# Patient Record
Sex: Female | Born: 1965 | Race: White | Hispanic: No | Marital: Married | Smoking: Never smoker
Health system: Southern US, Community
[De-identification: ages and names within clinical notes are randomized; demographics above are authoritative.]

## PROBLEM LIST (undated history)

## (undated) DIAGNOSIS — G259 Extrapyramidal and movement disorder, unspecified: Secondary | ICD-10-CM

## (undated) DIAGNOSIS — Z87442 Personal history of urinary calculi: Secondary | ICD-10-CM

## (undated) DIAGNOSIS — N200 Calculus of kidney: Secondary | ICD-10-CM

## (undated) DIAGNOSIS — H539 Unspecified visual disturbance: Secondary | ICD-10-CM

## (undated) DIAGNOSIS — G35D Multiple sclerosis, unspecified: Secondary | ICD-10-CM

## (undated) DIAGNOSIS — G629 Polyneuropathy, unspecified: Secondary | ICD-10-CM

## (undated) DIAGNOSIS — R51 Headache: Secondary | ICD-10-CM

## (undated) DIAGNOSIS — R519 Headache, unspecified: Secondary | ICD-10-CM

## (undated) DIAGNOSIS — A6009 Herpesviral infection of other urogenital tract: Secondary | ICD-10-CM

## (undated) DIAGNOSIS — K5190 Ulcerative colitis, unspecified, without complications: Secondary | ICD-10-CM

## (undated) DIAGNOSIS — G35 Multiple sclerosis: Secondary | ICD-10-CM

## (undated) DIAGNOSIS — I10 Essential (primary) hypertension: Secondary | ICD-10-CM

## (undated) DIAGNOSIS — B002 Herpesviral gingivostomatitis and pharyngotonsillitis: Secondary | ICD-10-CM

## (undated) HISTORY — DX: Headache, unspecified: R51.9

## (undated) HISTORY — DX: Headache: R51

## (undated) HISTORY — DX: Polyneuropathy, unspecified: G62.9

## (undated) HISTORY — DX: Unspecified visual disturbance: H53.9

## (undated) HISTORY — PX: KIDNEY STONE SURGERY: SHX686

## (undated) HISTORY — DX: Extrapyramidal and movement disorder, unspecified: G25.9

---

## 2013-01-08 ENCOUNTER — Other Ambulatory Visit: Payer: Self-pay | Admitting: Physician Assistant

## 2013-01-08 DIAGNOSIS — Z1231 Encounter for screening mammogram for malignant neoplasm of breast: Secondary | ICD-10-CM

## 2013-02-05 ENCOUNTER — Ambulatory Visit: Payer: Self-pay

## 2013-02-26 ENCOUNTER — Ambulatory Visit
Admission: RE | Admit: 2013-02-26 | Discharge: 2013-02-26 | Disposition: A | Discharge status: Home / Self Care | Payer: Managed Care, Other (non HMO) | Source: Ambulatory Visit | Attending: Physician Assistant | Admitting: Physician Assistant

## 2013-02-26 DIAGNOSIS — Z1231 Encounter for screening mammogram for malignant neoplasm of breast: Secondary | ICD-10-CM

## 2013-05-30 ENCOUNTER — Emergency Department (HOSPITAL_COMMUNITY): Payer: Managed Care, Other (non HMO)

## 2013-05-30 ENCOUNTER — Encounter (HOSPITAL_COMMUNITY): Payer: Self-pay | Admitting: Emergency Medicine

## 2013-05-30 DIAGNOSIS — R509 Fever, unspecified: Secondary | ICD-10-CM | POA: Insufficient documentation

## 2013-05-30 DIAGNOSIS — I10 Essential (primary) hypertension: Secondary | ICD-10-CM | POA: Insufficient documentation

## 2013-05-30 DIAGNOSIS — Z8669 Personal history of other diseases of the nervous system and sense organs: Secondary | ICD-10-CM | POA: Insufficient documentation

## 2013-05-30 DIAGNOSIS — Z79899 Other long term (current) drug therapy: Secondary | ICD-10-CM | POA: Insufficient documentation

## 2013-05-30 DIAGNOSIS — R52 Pain, unspecified: Secondary | ICD-10-CM | POA: Insufficient documentation

## 2013-05-30 DIAGNOSIS — Z880 Allergy status to penicillin: Secondary | ICD-10-CM | POA: Insufficient documentation

## 2013-05-30 DIAGNOSIS — Z8719 Personal history of other diseases of the digestive system: Secondary | ICD-10-CM | POA: Insufficient documentation

## 2013-05-30 DIAGNOSIS — R5381 Other malaise: Secondary | ICD-10-CM | POA: Insufficient documentation

## 2013-05-30 LAB — CBC WITH DIFFERENTIAL/PLATELET
Basophils Relative: 0 % (ref 0–1)
Hemoglobin: 10.6 g/dL — ABNORMAL LOW (ref 12.0–15.0)
Lymphocytes Relative: 3 % — ABNORMAL LOW (ref 12–46)
Lymphs Abs: 0.5 10*3/uL — ABNORMAL LOW (ref 0.7–4.0)
Monocytes Relative: 6 % (ref 3–12)
Neutro Abs: 14.3 10*3/uL — ABNORMAL HIGH (ref 1.7–7.7)
Neutrophils Relative %: 90 % — ABNORMAL HIGH (ref 43–77)
RBC: 3.46 MIL/uL — ABNORMAL LOW (ref 3.87–5.11)
WBC: 15.9 10*3/uL — ABNORMAL HIGH (ref 4.0–10.5)

## 2013-05-30 LAB — POCT I-STAT, CHEM 8
Chloride: 104 mEq/L (ref 96–112)
Glucose, Bld: 117 mg/dL — ABNORMAL HIGH (ref 70–99)
HCT: 35 % — ABNORMAL LOW (ref 36.0–46.0)
Potassium: 3.6 mEq/L (ref 3.5–5.1)

## 2013-05-30 NOTE — ED Notes (Signed)
Patient with fever since this am.  Patient has MS.  She is warm to the touch.  She did take 449m of ibuprofen prior to arrival to ED.

## 2013-05-31 ENCOUNTER — Emergency Department (HOSPITAL_COMMUNITY)
Admission: EM | Admit: 2013-05-31 | Discharge: 2013-05-31 | Disposition: A | Discharge status: Home / Self Care | Payer: Managed Care, Other (non HMO) | Attending: Emergency Medicine | Admitting: Emergency Medicine

## 2013-05-31 DIAGNOSIS — R509 Fever, unspecified: Secondary | ICD-10-CM

## 2013-05-31 DIAGNOSIS — R531 Weakness: Secondary | ICD-10-CM

## 2013-05-31 HISTORY — DX: Multiple sclerosis: G35

## 2013-05-31 HISTORY — DX: Essential (primary) hypertension: I10

## 2013-05-31 HISTORY — DX: Ulcerative colitis, unspecified, without complications: K51.90

## 2013-05-31 HISTORY — DX: Multiple sclerosis, unspecified: G35.D

## 2013-05-31 LAB — URINALYSIS, ROUTINE W REFLEX MICROSCOPIC
Glucose, UA: NEGATIVE mg/dL
Hgb urine dipstick: NEGATIVE
Specific Gravity, Urine: 1.027 (ref 1.005–1.030)
pH: 5.5 (ref 5.0–8.0)

## 2013-05-31 MED ORDER — TRAMADOL HCL 50 MG PO TABS
50.0000 mg | ORAL_TABLET | Freq: Once | ORAL | Status: AC
  Administered 2013-05-31: 50 mg via ORAL
  Filled 2013-05-31: qty 1

## 2013-05-31 NOTE — ED Provider Notes (Signed)
CSN: [id number]     Arrival date & time 05/30/13  2128 History   First MD Initiated Contact with Patient 05/31/13 0122     Chief Complaint  Patient presents with  . Fever   (Consider location/radiation/quality/duration/timing/severity/associated sxs/prior Treatment) Patient is a 47 y.o. female presenting with fever. The history is provided by the patient.  Fever She had onset this evening of fever which was as high as 101.7. He she had started feeling badly at about noon when she noted extreme weakness to the point where she could hardly raise her arms. She is also had chills and generalized bodyaches. Pain is moderate and she rates it at 5/10. She denies sore throat, cough, nausea, vomiting, diarrhea. She normally uses an adult diaper for urination and has not noted any dysuria. She has a history of multiple sclerosis. She did take ibuprofen at home prior to coming to the ED. Because of fever and body aches, she was asked about travel to [place] or exposure to someone who had traveled to [place] and she denies both of these.  Past Medical History  Diagnosis Date  . Multiple sclerosis   . Ulcerative colitis   . Hypertension    Past Surgical History  Procedure Laterality Date  . Kidney stone surgery     History reviewed. No pertinent family history. History  Substance Use Topics  . Smoking status: Never Smoker   . Smokeless tobacco: Not on file  . Alcohol Use: No   OB History   Grav Para Term Preterm Abortions TAB SAB Ect Mult Living                 Review of Systems  Constitutional: Positive for fever.  All other systems reviewed and are negative.    Allergies  Penicillins  Home Medications   Current Outpatient Rx  Name  Route  Sig  Dispense  Refill  . b complex vitamins capsule   Oral   Take 1 capsule by mouth daily.         . baclofen (LIORESAL) 10 MG tablet   Oral   Take 10 mg by mouth 3 (three) times daily.         . cholecalciferol  (VITAMIN D) 1000 UNITS tablet   Oral   Take 1,000 Units by mouth daily.         . dantrolene (DANTRIUM) 50 MG capsule   Oral   Take 50 mg by mouth 3 (three) times daily.         Marland Kitchen FLUoxetine (PROZAC) 20 MG tablet   Oral   Take 20 mg by mouth daily.         Marland Kitchen ibuprofen (ADVIL,MOTRIN) 200 MG tablet   Oral   Take 400 mg by mouth every 6 (six) hours as needed for pain (pain).         Marland Kitchen labetalol (NORMODYNE) 100 MG tablet   Oral   Take 100 mg by mouth daily.         . mesalamine (PENTASA) 500 MG CR capsule   Oral   Take 1,000 mg by mouth 3 (three) times daily.         Marland Kitchen omeprazole (PRILOSEC) 40 MG capsule   Oral   Take 40 mg by mouth 2 (two) times daily.         . solifenacin (VESICARE) 10 MG tablet   Oral   Take 10 mg by mouth daily.         Marland Kitchen  Teriflunomide 14 MG TABS   Oral   Take 14 mg by mouth daily.          BP 150/86  Pulse 93  Temp(Src) 98.5 F (36.9 C) (Oral)  Resp 16  Ht 5' 3"  (1.6 m)  Wt 170 lb (77.111 kg)  BMI 30.12 kg/m2  SpO2 94%  LMP 05/25/2013 Physical Exam  Nursing note and vitals reviewed.  47 year old female, resting comfortably and in no acute distress. Vital signs are significant for mild hypertension with blood pressure 150/86. Oxygen saturation is 94%, which is normal. Head is normocephalic and atraumatic. PERRLA, EOMI. Oropharynx is clear. Neck is nontender and supple without adenopathy or JVD. Back is nontender and there is no CVA tenderness. Lungs are clear without rales, wheezes, or rhonchi. Chest is nontender. Heart has regular rate and rhythm without murmur. Abdomen is soft, flat, nontender without masses or hepatosplenomegaly and peristalsis is normoactive. Extremities have no cyanosis or edema, full range of motion is present. Skin is warm and dry without rash. Neurologic: Mental status is normal, cranial nerves are intact. She has moderate bilateral arm weakness and moderate to severe bilateral leg weakness from  prior multiple sclerosis.   ED Course  Procedures (including critical care time) Labs Review Results for orders placed during the hospital encounter of 05/31/13  CBC WITH DIFFERENTIAL      Result Value Range   WBC 15.9 (*) 4.0 - 10.5 K/uL   RBC 3.46 (*) 3.87 - 5.11 MIL/uL   Hemoglobin 10.6 (*) 12.0 - 15.0 g/dL   HCT 32.3 (*) 36.0 - 46.0 %   MCV 93.4  78.0 - 100.0 fL   MCH 30.6  26.0 - 34.0 pg   MCHC 32.8  30.0 - 36.0 g/dL   RDW 15.5  11.5 - 15.5 %   Platelets 191  150 - 400 K/uL   Neutrophils Relative % 90 (*) 43 - 77 %   Neutro Abs 14.3 (*) 1.7 - 7.7 K/uL   Lymphocytes Relative 3 (*) 12 - 46 %   Lymphs Abs 0.5 (*) 0.7 - 4.0 K/uL   Monocytes Relative 6  3 - 12 %   Monocytes Absolute 1.0  0.1 - 1.0 K/uL   Eosinophils Relative 0  0 - 5 %   Eosinophils Absolute 0.0  0.0 - 0.7 K/uL   Basophils Relative 0  0 - 1 %   Basophils Absolute 0.0  0.0 - 0.1 K/uL  URINALYSIS, ROUTINE W REFLEX MICROSCOPIC      Result Value Range   Color, Urine AMBER (*) YELLOW   APPearance CLOUDY (*) CLEAR   Specific Gravity, Urine 1.027  1.005 - 1.030   pH 5.5  5.0 - 8.0   Glucose, UA NEGATIVE  NEGATIVE mg/dL   Hgb urine dipstick NEGATIVE  NEGATIVE   Bilirubin Urine SMALL (*) NEGATIVE   Ketones, ur 15 (*) NEGATIVE mg/dL   Protein, ur NEGATIVE  NEGATIVE mg/dL   Urobilinogen, UA 0.2  0.0 - 1.0 mg/dL   Nitrite NEGATIVE  NEGATIVE   Leukocytes, UA NEGATIVE  NEGATIVE  POCT I-STAT, CHEM 8      Result Value Range   Sodium 138  135 - 145 mEq/L   Potassium 3.6  3.5 - 5.1 mEq/L   Chloride 104  96 - 112 mEq/L   BUN 14  6 - 23 mg/dL   Creatinine, Ser 0.70  0.50 - 1.10 mg/dL   Glucose, Bld 117 (*) 70 - 99 mg/dL   Calcium,  Ion 0.83 (*) 1.12 - 1.23 mmol/L   TCO2 25  0 - 100 mmol/L   Hemoglobin 11.9 (*) 12.0 - 15.0 g/dL   HCT 35.0 (*) 36.0 - 46.0 %   Imaging Review Dg Chest 2 View  05/30/2013   CLINICAL DATA:  Fever.  EXAM: CHEST  2 VIEW  COMPARISON:  None.  FINDINGS: Hyperinflated lungs without edema or  consolidation. No definite effusion, although posterior costophrenic sulci are obscured. No cardiomegaly. Dextroscoliosis.  IMPRESSION: No evidence of pneumonia.   Electronically Signed   By: Jorje Guild M.D.   On: 05/30/2013 22:57    EKG Interpretation   None       MDM   1. Fever   2. Weakness    Fever with weakness in patient with multiple sclerosis. WBC has come back elevated with left shift. Chest x-ray is unremarkable. Urinalysis will be obtained to rule out UTI.  Urinalysis has come back negative. Patient states that her strength is almost back to normal. She continues to be afebrile even though it has been more than 6 hours since her last dose of ibuprofen. She does not appear toxic in any way. Do not see an indication for inpatient care but I am concerned about her fever and weakness. She will need followup with her PCP. Cultures are sent of blood and urine and she is advised to return if symptoms worsen in any way. In patient also pointed out that she has an area on her left third finger which has been treated as a fungal infection. This appears to be a low-grade paronychia without sufficient accumulation of breast aren't incision and drainage. She's a vice use topical treatment. I do not feel there is sufficient infection there to account for her fever at home.  Delora Fuel, MD 01/65/53 [id number]

## 2013-06-01 LAB — URINE CULTURE
Colony Count: NO GROWTH
Culture: NO GROWTH

## 2013-06-07 LAB — CULTURE, BLOOD (ROUTINE X 2)
Culture: NO GROWTH
Culture: NO GROWTH

## 2014-03-02 ENCOUNTER — Other Ambulatory Visit: Payer: Self-pay

## 2014-03-02 DIAGNOSIS — Z1231 Encounter for screening mammogram for malignant neoplasm of breast: Secondary | ICD-10-CM

## 2014-03-12 ENCOUNTER — Ambulatory Visit
Admission: RE | Admit: 2014-03-12 | Discharge: 2014-03-12 | Disposition: A | Discharge status: Home / Self Care | Payer: Managed Care, Other (non HMO) | Source: Ambulatory Visit

## 2014-03-12 ENCOUNTER — Encounter (INDEPENDENT_AMBULATORY_CARE_PROVIDER_SITE_OTHER): Payer: Self-pay

## 2014-03-12 DIAGNOSIS — Z1231 Encounter for screening mammogram for malignant neoplasm of breast: Secondary | ICD-10-CM

## 2014-09-07 ENCOUNTER — Ambulatory Visit (INDEPENDENT_AMBULATORY_CARE_PROVIDER_SITE_OTHER): Payer: Managed Care, Other (non HMO) | Admitting: Neurology

## 2014-09-07 ENCOUNTER — Encounter: Payer: Self-pay | Admitting: Neurology

## 2014-09-07 VITALS — BP 156/84 | HR 78 | Resp 14 | Ht 63.0 in | Wt 170.0 lb

## 2014-09-07 DIAGNOSIS — F329 Major depressive disorder, single episode, unspecified: Secondary | ICD-10-CM | POA: Diagnosis not present

## 2014-09-07 DIAGNOSIS — G8389 Other specified paralytic syndromes: Secondary | ICD-10-CM | POA: Diagnosis not present

## 2014-09-07 DIAGNOSIS — R350 Frequency of micturition: Secondary | ICD-10-CM | POA: Diagnosis not present

## 2014-09-07 DIAGNOSIS — R208 Other disturbances of skin sensation: Secondary | ICD-10-CM | POA: Diagnosis not present

## 2014-09-07 DIAGNOSIS — G35 Multiple sclerosis: Secondary | ICD-10-CM | POA: Diagnosis not present

## 2014-09-07 DIAGNOSIS — E559 Vitamin D deficiency, unspecified: Secondary | ICD-10-CM

## 2014-09-07 DIAGNOSIS — Z79899 Other long term (current) drug therapy: Secondary | ICD-10-CM | POA: Diagnosis not present

## 2014-09-07 DIAGNOSIS — F339 Major depressive disorder, recurrent, unspecified: Secondary | ICD-10-CM | POA: Insufficient documentation

## 2014-09-07 DIAGNOSIS — F32A Depression, unspecified: Secondary | ICD-10-CM

## 2014-09-07 DIAGNOSIS — G8250 Quadriplegia, unspecified: Secondary | ICD-10-CM | POA: Insufficient documentation

## 2014-09-07 MED ORDER — LAMOTRIGINE 100 MG PO TABS: 100.0000 mg | ORAL_TABLET | Freq: Every day | ORAL | Status: DC

## 2014-09-07 MED ORDER — FLUOXETINE HCL 40 MG PO CAPS: 40.0000 mg | ORAL_CAPSULE | Freq: Every day | ORAL | Status: DC

## 2014-09-07 MED ORDER — LAMOTRIGINE 25 MG PO TABS: ORAL_TABLET | ORAL | Status: DC

## 2014-09-07 MED ORDER — BACLOFEN 10 MG PO TABS: ORAL_TABLET | ORAL | Status: DC

## 2014-09-07 NOTE — Progress Notes (Addendum)
[facility]  [name] DOB: Mar 10, 1966  REFERRING CLINICIAN: Adah Salvage, [facility] HISTORY FROM: patient  REASON FOR VISIT: MS   HISTORICAL  CHIEF COMPLAINT:  Chief Complaint  Patient presents with  . Multiple Sclerosis    She continues to c/o tingling sensation right shoulder, running down right arm, into fingertips, only when she is in the shower.  Sts. fatigue is worse./fim    HISTORY OF PRESENT ILLNESS:  Vanessa Short is a 49 year old woman who presented with a Lhermite's syndrome in 1988 or 1989. When she flexed her neck, she would get a vibration sensation down to her toes. MRIs of the head and neck were performed. They showed lesions consistent with the diagnosis of multiple sclerosis. Lumbar puncture was not necessary. Over the next 15 years, she would get some exacerbations and have courses of steroids. However, a disease modifying therapy was not prescribed. By 2004, she had difficulty with her gait and would often have to lean on somebody for support.  She started Copaxone that year and stayed on it for about one half years. She stopped due to a lot of itching. She then switched to Rebif. While on Copaxone Rebif she continues to have exacerbations and further difficulties with her gait. About 3 years ago she switched to [facility] and has been on that medicine since. She tolerates Aubagio well but is not sure how well it is working for her.  Her main issue is decrease gait. She started using a cane on a regular basis around 2004. Then around 2007 or 2008 she started to use an IT trainer wheelchair. She continues to have a lot of weakness in the legs. Legs are also very spastic. She is on baclofen 10 mg by mouth 3 times a day with an additional half pill at bedtime. She also is on dantrolene. He also has had gradual worsening of spasticity in the arms, left worse than right. Botox therapy but it did not significantly help her. It led  to a few weeks off significantly less spasming in the hands and maybe a couple more weeks with some reduction of spasming. However, it did not increase function. Her right arm is less spastic than the left but she still has difficulty with muscle spasms and sometimes requires helped to straighten the arm out.  She has painful dysesthesias in the right shoulder. The pain will increase if it is hit by hot water. This occurs when she takes a shower. We had tried gabapentin. I was hoping to get her up to a dose of at least 900 mg a day. However, even a dose of 600 mg a day was not well tolerated due to lethargy.  She does have some difficulty with vision, both with acuity and with double vision at times. In 4 or 5 years and we discussed that she should probably follow up with an ophthalmologist for checkup.  She notes vertigo at times. It is spinning in nature. This is more likely to happen right after her shower. If she closes her eyes the vertigo bothers her a little less. It may last up to 45 minutes.  She has mild bladder dysfunction. When she is at home she usually is able to get to the bathroom to urinate. However, she wears depends as she will sometimes have incontinence. Out of the house is more difficult  She notes more fatigue. More stress having to deal with her father's estate as he was out of state and she  is not getting any significant help from her siblings. She is having more difficulty with her sleep. He usually falls asleep well but if she wakes up she could have difficulty falling asleep. May have received mild benefit with the gabapentin to fall asleep but not as much to stay asleep. She has not had major problems with cognitive issues. She does report some depression that has been worse the past year. Fluoxetine has helped a little bit in that she is less likely to "blow up" however, it has not helped her mood for much of the day.   REVIEW OF SYSTEMS:  Constitutional: No fevers,  chills, sweats, or change in appetite Eyes: see above.   No eye pain Ear, nose and throat: No hearing loss, ear pain, nasal congestion, sore throat Cardiovascular: No chest pain, palpitations Respiratory:  No shortness of breath at rest or with exertion.   No wheezes GastrointestinaI: No nausea, vomiting, diarrhea, abdominal pain, fecal incontinence Genitourinary:  No dysuria, urinary retention or frequency.  No nocturia. Musculoskeletal:  No neck pain, back pain Integumentary: No rash, pruritus, skin lesions Neurological: as above Psychiatric: as above. Endocrine: No palpitations, diaphoresis, change in appetite, change in weigh or increased thirst Hematologic/Lymphatic:  No anemia, purpura, petechiae. Allergic/Immunologic: No itchy/runny eyes, nasal congestion, recent allergic reactions, rashes  ALLERGIES: Allergies  Allergen Reactions  . Penicillins Rash    HOME MEDICATIONS: Outpatient Prescriptions Prior to Visit  Medication Sig Dispense Refill  . b complex vitamins capsule Take 1 capsule by mouth daily.    . baclofen (LIORESAL) 10 MG tablet Take 10 mg by mouth 3 (three) times daily.    . cholecalciferol (VITAMIN D) 1000 UNITS tablet Take 1,000 Units by mouth daily.    . dantrolene (DANTRIUM) 50 MG capsule Take 50 mg by mouth 3 (three) times daily.    Marland Kitchen ibuprofen (ADVIL,MOTRIN) 200 MG tablet Take 400 mg by mouth every 6 (six) hours as needed for pain (pain).    Marland Kitchen labetalol (NORMODYNE) 100 MG tablet Take 100 mg by mouth daily.    . mesalamine (PENTASA) 500 MG CR capsule Take 1,000 mg by mouth 3 (three) times daily.    Marland Kitchen omeprazole (PRILOSEC) 40 MG capsule Take 40 mg by mouth 2 (two) times daily.    . solifenacin (VESICARE) 10 MG tablet Take 10 mg by mouth daily.    . Teriflunomide 14 MG TABS Take 14 mg by mouth daily.    Marland Kitchen FLUoxetine (PROZAC) 20 MG tablet Take 20 mg by mouth daily.     No facility-administered medications prior to visit.    PAST MEDICAL HISTORY: Past  Medical History  Diagnosis Date  . Multiple sclerosis   . Ulcerative colitis   . Hypertension   . Headache   . Movement disorder   . Neuropathy   . Vision abnormalities     PAST SURGICAL HISTORY: Past Surgical History  Procedure Laterality Date  . Kidney stone surgery      FAMILY HISTORY: Family History  Problem Relation Age of Onset  . Dementia Mother   . Hypertension Father   . Hyperlipidemia Father   . Diabetes Father   . Heart disease Father     SOCIAL HISTORY:  History   Social History  . Marital Status: Married    Spouse Name: N/A    Number of Children: N/A  . Years of Education: N/A   Occupational History  . Not on file.   Social History Main Topics  . Smoking status:  Never Smoker   . Smokeless tobacco: Not on file  . Alcohol Use: No  . Drug Use: No  . Sexual Activity: Not on file   Other Topics Concern  . Not on file   Social History Narrative     PHYSICAL EXAM  Filed Vitals:   09/07/14 1357  BP: 156/84  Pulse: 78  Resp: 14  Height: 5' 3"  (1.6 m)  Weight: 170 lb (77.111 kg)    Body mass index is 30.12 kg/(m^2).   General: The patient is well-developed and well-nourished and in no acute distress  Eyes:  Funduscopic exam shows normal optic discs and retinal vessels.  Neck: The neck is supple, no carotid bruits are noted.  The neck is nontender.  Respiratory: The respiratory examination is clear.  Cardiovascular: The cardiovascular examination reveals a regular rate and rhythm, no murmurs, gallops or rubs are noted.  Skin: Extremities are without significant edema.  Neurologic Exam  Mental status: The patient is alert and oriented x 3 at the time of the examination. The patient has apparent normal recent and remote memory, with an apparently normal attention span and concentration ability.   Speech is normal.  Cranial nerves: Extraocular movements are full. Pupils are equal, round, and reactive to light and accomodation.  Visual  fields are full.  Facial symmetry is present. There is good facial sensation to soft touch bilaterally.Facial strength is normal.  Trapezius and sternocleidomastoid strength is normal. No dysarthria is noted.  The tongue is midline, and the patient has symmetric elevation of the soft palate. No obvious hearing deficits are noted.  Motor:  Muscle bulk is normal .  Tone is increased. Strength is  0/5 in legs, in arms strength is 2--2/5 proximal and 3/5 distal in left arm and 3 to 4-/5 in right arm with extension > flexion weakness.    Sensory: Sensory testing is intact to pinprick, soft touch, vibration sensation sense on all 4 extremities.  Coordination: Cerebellar testing is poor bilateral due to weakness.  Gait and station: She can not stand or walk.    Reflexes: Deep tendon reflexes show spread at the knees though jerks are poor. Plantar responses are normal.     DIAGNOSTIC DATA (LABS, IMAGING, TESTING) - I reviewed patient records, labs, notes, testing and imaging myself where available.  Lab Results  Component Value Date   WBC 15.9* 05/30/2013   HGB 11.9* 05/30/2013   HCT 35.0* 05/30/2013   MCV 93.4 05/30/2013   PLT 191 05/30/2013      Component Value Date/Time   NA 138 05/30/2013 2200   K 3.6 05/30/2013 2200   CL 104 05/30/2013 2200   GLUCOSE 117* 05/30/2013 2200   BUN 14 05/30/2013 2200   CREATININE 0.70 05/30/2013 2200       ASSESSMENT AND PLAN  Multiple sclerosis - Plan: CBC with Differential/Platelet, Hepatic Function Panel, Ambulatory referral to Physical Therapy, Ambulatory referral to Occupational Therapy, DME Wheelchair electric, CBC with Differential/Platelet, CBC with Differential/Platelet  Spastic quadriparesis - Plan: Ambulatory referral to Physical Therapy, Ambulatory referral to Occupational Therapy, DME Wheelchair electric  Dysesthesia  Depression  High risk medication use - Plan: CBC with Differential/Platelet, Hepatic Function Panel, CBC with  Differential/Platelet, CBC with Differential/Platelet  Urinary frequency  Vitamin D deficiency  In summary, Janiya Petzold is a 49 year old woman a long history of multiple sclerosis who has had progressive worsening of muscle weakness and spasticity legs are very weak. Over the last 5 years her arms have become weaker  with more spasticity, left worse than right. A trial of Botox for upper limb spasticity helped the spasms somewhat but did not help functionality. She remains on Aubagio and tolerates it well. I will check some blood work to monitor his safety issues. I will order physical and occupational therapy in an effort to try to help improve functionality further. Mood is worse and now double the fluoxetine to 40 mg. Additionally, she has not tried higher doses of baclofen and we will push that up to 50 mg a day. Her current wheelchair is 84 or 50 years old and functioning as well. I will order a new electric wheelchair. She understands that this is usually a process and cannot be accomplished with just a prescription most of the time.   Gabapentin has not helped the dysesthesias and is poorly tolerated. We will stop the gabapentin and have her start lamotrigine and titrate up to a dose of 100 mg by mouth twice a day. We reviewed the side effects including the possibility of a severe rash. The rash occurs she has to stop the medicine immediately let us know.  She will return for a regular scheduled follow-up in 4 months. However, if she has new or worsening neurologic symptoms she should call us sooner.  This was a 75 minute face-to-face visit with more than 50% of the time counseling and coordinating care about her multiple sclerosis and impairments.  Kimba Lottes A. Felecia Shelling, MD, PhD 03/01/5973, 7:18 PM Certified in Neurology, Clinical Neurophysiology, Sleep Medicine, Pain Medicine and Neuroimaging  [facility] [address], [place], [place] [address] [phone]

## 2014-09-07 NOTE — Patient Instructions (Signed)

## 2014-09-08 LAB — CBC WITH DIFFERENTIAL/PLATELET
BASOS ABS: 0.1 10*3/uL (ref 0.0–0.2)
BASOS: 1 %
EOS: 3 %
Eosinophils Absolute: 0.2 10*3/uL (ref 0.0–0.4)
HCT: 32.8 % — ABNORMAL LOW (ref 34.0–46.6)
Hemoglobin: 10.5 g/dL — ABNORMAL LOW (ref 11.1–15.9)
IMMATURE GRANULOCYTES: 0 %
Immature Grans (Abs): 0 10*3/uL (ref 0.0–0.1)
Lymphocytes Absolute: 1.3 10*3/uL (ref 0.7–3.1)
Lymphs: 17 %
MCH: 28.6 pg (ref 26.6–33.0)
MCHC: 32 g/dL (ref 31.5–35.7)
MCV: 89 fL (ref 79–97)
Monocytes Absolute: 0.9 10*3/uL (ref 0.1–0.9)
Monocytes: 13 %
NEUTROS PCT: 66 %
Neutrophils Absolute: 4.9 10*3/uL (ref 1.4–7.0)
PLATELETS: 220 10*3/uL (ref 150–379)
RBC: 3.67 x10E6/uL — ABNORMAL LOW (ref 3.77–5.28)
RDW: 16.4 % — ABNORMAL HIGH (ref 12.3–15.4)
WBC: 7.4 10*3/uL (ref 3.4–10.8)

## 2014-09-08 LAB — HEPATIC FUNCTION PANEL
ALK PHOS: 76 IU/L (ref 39–117)
ALT: 19 IU/L (ref 0–32)
AST: 18 IU/L (ref 0–40)
Albumin: 3.9 g/dL (ref 3.5–5.5)
Bilirubin, Direct: 0.28 mg/dL (ref 0.00–0.40)
TOTAL PROTEIN: 6.3 g/dL (ref 6.0–8.5)
Total Bilirubin: 0.4 mg/dL (ref 0.0–1.2)

## 2014-10-02 ENCOUNTER — Ambulatory Visit: Payer: Managed Care, Other (non HMO) | Attending: Neurology

## 2014-10-02 ENCOUNTER — Encounter: Payer: Self-pay | Admitting: Occupational Therapy

## 2014-10-02 ENCOUNTER — Ambulatory Visit: Payer: Managed Care, Other (non HMO) | Admitting: Occupational Therapy

## 2014-10-02 DIAGNOSIS — M62838 Other muscle spasm: Secondary | ICD-10-CM

## 2014-10-02 DIAGNOSIS — R293 Abnormal posture: Secondary | ICD-10-CM | POA: Diagnosis not present

## 2014-10-02 DIAGNOSIS — G729 Myopathy, unspecified: Secondary | ICD-10-CM | POA: Diagnosis not present

## 2014-10-02 DIAGNOSIS — M6281 Muscle weakness (generalized): Secondary | ICD-10-CM | POA: Diagnosis not present

## 2014-10-02 DIAGNOSIS — R29898 Other symptoms and signs involving the musculoskeletal system: Secondary | ICD-10-CM

## 2014-10-02 DIAGNOSIS — M6249 Contracture of muscle, multiple sites: Secondary | ICD-10-CM | POA: Diagnosis not present

## 2014-10-02 DIAGNOSIS — R2689 Other abnormalities of gait and mobility: Secondary | ICD-10-CM | POA: Diagnosis not present

## 2014-10-02 DIAGNOSIS — M2560 Stiffness of unspecified joint, not elsewhere classified: Secondary | ICD-10-CM | POA: Diagnosis not present

## 2014-10-02 DIAGNOSIS — M6289 Other specified disorders of muscle: Secondary | ICD-10-CM

## 2014-10-02 NOTE — Therapy (Signed)
[facility] [address] [place], [place], [address] Phone: [phone]   Fax:  [phone]  Occupational Therapy Evaluation  Patient Details  Name: Vanessa Short MRN: [id number] Date of Birth: May 13, 1966 Referring Provider:  Britt Bottom., MD  Encounter Date: 10/02/2014      OT End of Session - 10/02/14 1427    Visit Number 1   Number of Visits 17   Date for OT Re-Evaluation 11/27/14   Authorization Type Aetna - 120 combined visit total   OT Start Time 1107   OT Stop Time 1150   OT Time Calculation (min) 43 min   Activity Tolerance Patient tolerated treatment well   Behavior During Therapy [facility] for tasks assessed/performed      Past Medical History  Diagnosis Date  . Multiple sclerosis   . Ulcerative colitis   . Hypertension   . Headache   . Movement disorder   . Neuropathy   . Vision abnormalities     Past Surgical History  Procedure Laterality Date  . Kidney stone surgery      There were no vitals taken for this visit.  Visit Diagnosis:  Muscle spasticity - Plan: Ot plan of care cert/re-cert  Reduced active range of joint movement - Plan: Ot plan of care cert/re-cert  Postural instability - Plan: Ot plan of care cert/re-cert  Weakness of both upper extremities - Plan: Ot plan of care cert/re-cert      Subjective Assessment - 10/02/14 1118    Symptoms Weakness in hands, spasticity in hands and arms limits function - gradual progression   Pertinent History see epic snapshot          [facility] OT Assessment - 10/02/14 1342    Precautions   Precautions Fall   Precaution Comments reliant on lift for transfers   Restrictions   Weight Bearing Restrictions No   Home  Environment   Bathroom Shower/Tub Other (comment)  roll in shower with rolling shower chair   Lives With Daughter;Spouse  Daughter is 70 - [employer]   Prior Function   Level of Independence Needs assistance with ADLs;Needs  assistance with homemaking   Leisure Enjoys computer for face book, games, enjoys scrapbooking   ADL   Eating/Feeding Moderate assistance   Eating/Feeding Patient Percentage 50%   Grooming + 1 Total assistance   Grooming Patient Percentage 0%   Upper Body Bathing + 1 Total asssestance   Upper Body Bathing: Patient Percentage 10%   Lower Body Bathing + 1 Total assistance   Lower Body Bathing Patient Percentage 0%   Upper Body Dressing + 1 Total assistance   Lower Body Dressing +1 Total aassistance   Toilet Tranfer + 1 Total assistance   Toilet Transfer Method Other (comment)  sit to stand lift   Toilet Transer Equipment --  lift   Toileting - Clothing Manipulation + 1 Total assistance   Where Assessed - Toileting Clothing Manipulationn + 1 Total assistance   Tub/Shower Transfer + 1 Total assistance   Tub/Shower Transfer Details (indicate cue type and reason transfers via lift to rolling shower chair   Tub/Shower Transfer Method --  lift per patient report   IADL   Shopping Assistance for transportation;Needs to be accompanied on any shopping trip   Prior Level of Function Light Housekeeping Could do laundry with simple modifications when in [place] (3 yrs ago)   Meal Prep Needs to have meals prepared and served  Enjoys assisting with planning meals  Community Mobility Relies on family or friends for transportation  aide   Mobility   Mobility Status Comments power wheelchair   Written Expression   Dominant Hand Right   Vision - History   Baseline Vision Wears glasses all the time   Activity Tolerance   Sitting Balance Performs 25%or less sitting activity   Cognition   Overall Cognitive Status Impaired/Different from baseline   Area of Impairment Problem solving;Attention   Problem Solving Comments reports change in ability to multi task 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   Attention Selective   Sensation   Light Touch Appears Intact  with  exception of left thumb - diminished   Coordination   Gross Motor Movements are Fluid and Coordinated No   Fine Motor Movements are Fluid and Coordinated No   Coordination and Movement Description movements in hand limited by spasticity, weakness, and muscle imbalance   9 Hole Peg Test Right;Left  unable   Tone   Assessment Location Right Upper Extremity;Left Upper Extremity   AROM   Overall AROM  Deficits   Overall AROM Comments uses lateral trunk flexion and breath holding to raise arms   Right Shoulder Flexion --  ~25 degrees   Right Shoulder ABduction --  ~25 degrees   Left Shoulder Flexion --  ~15 degrees   Left Shoulder ABduction --  ~15 degrees   Right Elbow Flexion --  ~ 50%   Right Elbow Extension --  full gravity eliminated   Left Elbow Flexion --  ~25%   Left Elbow Extension --  full gravity eliminated   Right Forearm Pronation 80 Degrees   Right Forearm Supination 40 Degrees   Left Forearm Pronation 90 Degrees   Left Forearm Supination 30 Degrees   Right Wrist Extension 15 Degrees   Right Wrist Flexion 75 Degrees   Left Wrist Extension --  trace   Left Wrist Flexion 80 Degrees   Strength   Overall Strength Deficits   Overall Strength Comments limited ability to move against gravity, spasticity present   RUE Tone   RUE Tone Moderate  elbow extensors   LUE Tone   LUE Tone Moderate  elbow extensors                       OT Education - 10/02/14 1426    Education provided Yes   Education Details Discussed remediation versus compensation related to UE functioning, has active movement - hope to address active movement, coordination and strength before moving on to compensatory strategies   Person(s) Educated Patient   Methods Explanation   Comprehension Verbalized understanding          OT Short Term Goals - 10/02/14 1435    OT SHORT TERM GOAL #1   Title Patient will be independent with home activity program   Time 4   Period Weeks    Status New   OT SHORT TERM GOAL #2   Title Patient will demonstrate improved active finger extension in right hand to allow her to shape around cup for drinking   Time 4   Period Weeks   Status New   OT SHORT TERM GOAL #3   Title Patient will demonstrate adequate grasp and control to manipulate eating utensils (built up) to bring non - finger foods to mouth after set up assistance   Time 4   Period Weeks   Status New   OT SHORT TERM GOAL #4  Title Patient will demonstrate adequate sitting balance to except small weight shifts in any direction to improve safety with functional tasks such as toileting   Time 4   Period Weeks   Status New           OT Long Term Goals - 10/02/14 1438    OT LONG TERM GOAL #1   Title Patient will be independent with updated home exercise program   Time 8   Period Weeks   Status New   OT LONG TERM GOAL #2   Title Patient will demonstarte effective ability to guide aide to complete PROM program for bilateral upper extremities   Time 8   Period Weeks   Status New   OT LONG TERM GOAL #3   Title Patient will demonstrate effective use of hands, or adaptive equipment to type a simple message on facebook   Time 8   Period Weeks   Status New   OT LONG TERM GOAL #4   Title Patient will explore options to return to scrapbooking with moderate assistance from family or aide.                 Plan - 10/02/14 1428    Clinical Impression Statement Patient presents as a 49 year old female with history of MS with steady decline in function in UE's and LE's.  Patient presents with spastic quadriparesis   Pt will benefit from skilled therapeutic intervention in order to improve on the following deficits (Retired) Decreased activity tolerance;Decreased balance;Decreased cognition;Decreased knowledge of use of DME;Decreased endurance;Decreased coordination;Decreased range of motion;Decreased strength;Impaired flexibility;Impaired sensation;Impaired  tone;Impaired UE functional use;Improper body mechanics;Obesity   Rehab Potential Good   OT Frequency 2x / week   OT Duration 8 weeks   OT Treatment/Interventions Self-care/ADL training;Electrical Stimulation;Moist Heat;Fluidtherapy;Therapeutic exercise;Neuromuscular education;Energy conservation;Functional Mobility Training;DME and/or AE instruction;Passive range of motion;Cognitive remediation/compensation;Therapeutic activities;Therapeutic exercises;Balance training;Patient/family education;Splinting   Plan Consider hand splints to encourage digit extension, and reinforce arches in hands - wearing schedule?  Initiate HEP  UE'S , need to address / assess trunk control in sitting (lift transfers at home)   Consulted and Agree with Plan of Care Patient        Problem List Patient Active Problem List   Diagnosis Date Noted  . Multiple sclerosis 09/07/2014  . 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 Dysesthesia 09/07/2014  . Depression 09/07/2014   Antony Salmon, OTR/L 10/02/2014 2:44 PM Phone: [phone] Fax: [phone]    [facility] [address] [place], [place], [address] Phone: [phone]   Fax:  [phone]

## 2014-10-02 NOTE — Therapy (Addendum)
[facility] [address] [place], [place], [address] Phone: [phone]   Fax:  [phone]  Physical Therapy Evaluation  Patient Details  Name: Vanessa Short MRN: [id number] Date of Birth: 08/29/1965 Referring Provider:  Britt Bottom., MD  Encounter Date: 10/02/2014      PT End of Session - 10/02/14 7209    Visit Number 1   Number of Visits 9   Date for PT Re-Evaluation 12/01/14   Authorization Type Aetna 120 visit limit PT/OT combined   PT Start Time 1018   PT Stop Time 1100   PT Time Calculation (min) 42 min      Past Medical History  Diagnosis Date  . Multiple sclerosis   . Ulcerative colitis   . Hypertension   . Headache   . Movement disorder   . Neuropathy   . Vision abnormalities     Past Surgical History  Procedure Laterality Date  . Kidney stone surgery      There were no vitals taken for this visit.  Visit Diagnosis:  Decreased functional mobility  Muscle stiffness  Muscle weakness of lower extremity      Subjective Assessment - 10/02/14 1030    Symptoms Pt has history of Multiple Sclerosis since 1990. She is now wheelchair bound. She has notable spasticity in BLE which is most cumbersome in the morning or when transferring in/out of bed. She also feels that the knees become tight while sitting in wheelchair. Pt reports she continues to have to travel by airplane to [place] occasionally and travel is very challenging with her lack of mobility. All mobility is dependent and pt uses lift transers when at home, but a lift is not available when traveling.   Pertinent History Scoliotic curve toward left, depression   Patient Stated Goals To decrease spasiticity and increase strength.    Currently in Pain? No/denies          [facility] PT Assessment - 10/02/14 1628    Assessment   Medical Diagnosis Multiple Sclerosis   Onset Date --  1990's   Prior Therapy --  3-4 years ago in [place]   Precautions   Precautions Fall  dependent mobility, intolerance to extremely warm or cold   Required Braces or Orthoses --  none   Restrictions   Weight Bearing Restrictions No   Balance Screen   Has the patient fallen in the past 6 months No   Has the patient had a decrease in activity level because of a fear of falling?  No   Is the patient reluctant to leave their home because of a fear of falling?  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   Cognition   Overall Cognitive Status Within Functional Limits for tasks assessed   Observation/Other Assessments   Focus on Therapeutic Outcomes (FOTO)  not appropriate for wheelchair bound patient   Posture/Postural Control   Posture/Postural Control Postural limitations   Postural Limitations Rounded Shoulders;Forward head;Weight shift left  scoliotic curve left   Transfers  Transfers --  dependent   Ambulation/Gait   Ambulation/Gait No  non ambulatory, cannot stand   Balance   Balance Assessed Yes   Static Sitting Balance   Static Sitting - Balance Support --  pt unable to sit without back support                               PT Long Term Goals - 10/02/14 1641    PT LONG TERM GOAL #1   Title To be met by 11/09/15: Pt and caregiver to demonstrate correct performance of stretching HEP to reduce stiffness and spasticity and increase comfort..   PT LONG TERM GOAL #2   Title To be met by 11/09/15: Trial standing frame and determine if this would be beneficial for home  use for normalization of tone, if so, acquire MD order and begin ordering process.   PT LONG TERM GOAL #3   Title To be met by 11/09/15: Pt and caregiver to verbalize and/or demonstrate body mechanics techniques to increase ease of dependent transfer without lift equipment for decreased burden of care and increased safety of patient when traveling without mechanical lift.               Plan - 10/02/14 1635    Clinical Impression Statement Pt has been wheelchair bound and dependent with transfers for 6 years. Pt presents with c/o spasticity and muscle stiffness as well as inability to assist at all with transfers when she is traveling and there is no lift available. Pt has tight lower extremity musculature and no activation of BLE except trace activation of the left gastroc.  Pt will benefit from skilled PT to determine if there is any possibility of strengthening muscles, to trial standing frame and determine benefit for possible home use, to develop a stretching home program for decreased spasiticity and decreased risk of contractures, and to learn various body mechanics techniques to assist with transfers as able.   Rehab Potential Fair   Clinical Impairments Affecting Rehab Potential chronicity of deficits   PT Frequency 2x / week   PT Duration 4 weeks   PT Treatment/Interventions ADLs/Self Care Home Management;Electrical Stimulation;Therapeutic exercise;Patient/family education;Balance training;Passive range of motion;Manual techniques;Neuromuscular re-education;Functional mobility training;DME Instruction;Therapeutic activities   PT Next Visit Plan HEP to include stretching program for BLE, attempt ankle pumps in supine. Determine if pt can achieve trace contraction in supine and if so, add LE and core contraction to HEP. Trial standing frame.   Consulted and Agree with Plan of Care Patient      Late G code entry for [employer] by [email or web address], PT Self Care Current STatus CM Goal  Status CK   Problem List Patient Active Problem List   Diagnosis Date Noted  . Multiple sclerosis 09/07/2014  . 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 Dysesthesia 09/07/2014  . Depression 09/07/2014    Delrae Sawyers D 10/02/2014, 4:48 PM  [facility] [address] [place], [place], [address] Phone: [phone]   Fax:  [phone]

## 2014-10-06 ENCOUNTER — Telehealth: Payer: Self-pay | Admitting: Neurology

## 2014-10-06 NOTE — Telephone Encounter (Signed)
Spoke with Vanessa Short, and per RAS, advised weakness in thumb is likely not r/t Lamictal.  She sts. she is also having more spasticity in hands.  Denies all other sx. No signs of infection.  If this continues or worsens, she will call back for an appt/fim

## 2014-10-06 NOTE — Telephone Encounter (Signed)
Patient stated since taking Rx lamoTRIgine (LAMICTAL) 25 MG tablet., she experiencing left thumb numbness.  She started taking 1 tab for 1 week, 2 tabs once a day for 2nd week, 3 tablets for 3rd week, and now starting 4th week.  Please call and advise.

## 2014-10-13 ENCOUNTER — Ambulatory Visit: Payer: Managed Care, Other (non HMO) | Admitting: Occupational Therapy

## 2014-10-13 ENCOUNTER — Ambulatory Visit: Payer: Managed Care, Other (non HMO) | Attending: Neurology | Admitting: Physical Therapy

## 2014-10-13 ENCOUNTER — Encounter: Payer: Self-pay | Admitting: Physical Therapy

## 2014-10-13 ENCOUNTER — Encounter: Payer: Self-pay | Admitting: Occupational Therapy

## 2014-10-13 DIAGNOSIS — M2560 Stiffness of unspecified joint, not elsewhere classified: Secondary | ICD-10-CM

## 2014-10-13 DIAGNOSIS — M6249 Contracture of muscle, multiple sites: Secondary | ICD-10-CM | POA: Insufficient documentation

## 2014-10-13 DIAGNOSIS — R293 Abnormal posture: Secondary | ICD-10-CM | POA: Diagnosis not present

## 2014-10-13 DIAGNOSIS — R29898 Other symptoms and signs involving the musculoskeletal system: Secondary | ICD-10-CM | POA: Insufficient documentation

## 2014-10-13 DIAGNOSIS — M6281 Muscle weakness (generalized): Secondary | ICD-10-CM | POA: Diagnosis not present

## 2014-10-13 DIAGNOSIS — G729 Myopathy, unspecified: Secondary | ICD-10-CM | POA: Insufficient documentation

## 2014-10-13 DIAGNOSIS — R2689 Other abnormalities of gait and mobility: Secondary | ICD-10-CM | POA: Insufficient documentation

## 2014-10-13 DIAGNOSIS — M6289 Other specified disorders of muscle: Secondary | ICD-10-CM

## 2014-10-13 NOTE — Therapy (Signed)
[facility] [address] [place], [place], [address] Phone: [phone]   Fax:  [phone]  Occupational Therapy Treatment  Patient Details  Name: Vanessa Short MRN: [id number] Date of Birth: September 30, 1965 Referring Provider:  Britt Bottom., MD  Encounter Date: 10/13/2014      OT End of Session - 10/13/14 1410    Visit Number 2   Number of Visits 17   Date for OT Re-Evaluation 11/27/14   Authorization Type Aetna - 120 combined visit total   OT Start Time 1316   OT Stop Time 1359   OT Time Calculation (min) 43 min   Activity Tolerance Patient tolerated treatment well      Past Medical History  Diagnosis Date  . Multiple sclerosis   . Ulcerative colitis   . Hypertension   . Headache   . Movement disorder   . Neuropathy   . Vision abnormalities     Past Surgical History  Procedure Laterality Date  . Kidney stone surgery      There were no vitals taken for this visit.  Visit Diagnosis:  Weakness of both upper extremities      Subjective Assessment - 10/13/14 1322    Symptoms I feel better today than yesterday   Pertinent History see epic snapshot   Currently in Pain? Yes   Pain Score 8    Pain Location Hand  pt reports discomfort vs true pain.    Pain Orientation Right;Left   Pain Descriptors / Indicators Aching   Pain Type Acute pain   Pain Onset 1 to 4 weeks ago   Aggravating Factors  stress, heat, over use of hands,    Pain Relieving Factors sit in recliner and allow hands to relax                 OT Treatments/Exercises (OP) - 10/13/14 0001    Neurological Re-education Exercises   Other Exercises 1 Neuro re ed to address low reach, grasp and release. Pt requires mod facilitation for activity.  Pt requires frequent tone reduction throughout activity.    Manual Therapy   Manual Therapy Joint mobilization;Passive ROM   Joint Mobilization Joint mob for R hand to reduce stress with  AROM; PROM and tone reduction techniques to facilitate functional use of RUE.                  OT Short Term Goals - 10/13/14 1702    OT SHORT TERM GOAL #1   Title Patient will be independent with home activity program   Time 4   Period Weeks   Status New   OT SHORT TERM GOAL #2   Title Patient will demonstrate improved active finger extension in right hand to allow her to shape around cup for drinking   Time 4   Period Weeks   Status New   OT SHORT TERM GOAL #3   Title Patient will demonstrate adequate grasp and control to manipulate eating utensils (built up) to bring non - finger foods to mouth after set up assistance   Time 4   Period Weeks   Status New   OT SHORT TERM GOAL #4   Title Patient will demonstrate adequate sitting balance to except small weight shifts in any direction to improve safety with functional tasks such as toileting   Time 4   Period Weeks   Status New           OT Long Term Goals -  10/13/14 1702    OT LONG TERM GOAL #1   Title Patient will be independent with updated home exercise program   Time 8   Period Weeks   Status On-going   OT LONG TERM GOAL #2   Title Patient will demonstarte effective ability to guide aide to complete PROM program for bilateral upper extremities   Time 8   Period Weeks   Status On-going   OT LONG TERM GOAL #3   Title Patient will demonstrate effective use of hands to hold phone to ear (revised per pt request)   Time 8   Period Weeks   Status New   OT LONG TERM GOAL #4   Title Patient will explore options to return to scrapbooking with moderate assistance from family or aide.     Status On-going               Plan - 10/13/14 1618    Clinical Impression Statement Pt with slow progress toward goals.  Pt with signicficant tone which inteferes with UE function   Pt will benefit from skilled therapeutic intervention in order to improve on the following deficits (Retired) Decreased activity  tolerance;Decreased balance;Decreased cognition;Decreased knowledge of use of DME;Decreased endurance;Decreased coordination;Decreased range of motion;Decreased strength;Impaired flexibility;Impaired sensation;Impaired tone;Impaired UE functional use;Improper body mechanics;Obesity   Rehab Potential Good   OT Frequency 2x / week   OT Duration 8 weeks   OT Treatment/Interventions Self-care/ADL training;Electrical Stimulation;Moist Heat;Fluidtherapy;Therapeutic exercise;Neuromuscular education;Energy conservation;Functional Mobility Training;DME and/or AE instruction;Passive range of motion;Cognitive remediation/compensation;Therapeutic activities;Therapeutic exercises;Balance training;Patient/family education;Splinting   Plan HEP   Consulted and Agree with Plan of Care Patient        Problem List Patient Active Problem List   Diagnosis Date Noted  . Multiple sclerosis 09/07/2014  . 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 Dysesthesia 09/07/2014  . Depression 09/07/2014    Quay Burow, OTR/L 10/13/2014, 5:04 PM  [facility] [address] [place], [place], [address] Phone: [phone]   Fax:  [phone]

## 2014-10-13 NOTE — Therapy (Signed)
[facility] [address] [place], [place], [address] Phone: [phone]   Fax:  [phone]  Physical Therapy Treatment  Patient Details  Name: Vanessa Short MRN: [id number] Date of Birth: November 21, 1965 Referring Provider:  Britt Bottom., MD  Encounter Date: 10/13/2014      PT End of Session - 10/13/14 1654    Visit Number 2   Number of Visits 9   Date for PT Re-Evaluation 12/01/14   Authorization Type [employer] 120 visit limit PT/OT combined   PT Start Time 1445   PT Stop Time 1518   PT Time Calculation (min) 45 min   Activity Tolerance Patient tolerated treatment well   Behavior During Therapy [facility] for tasks assessed/performed      Past Medical History  Diagnosis Date  . Multiple sclerosis   . Ulcerative colitis   . Hypertension   . Headache   . Movement disorder   . Neuropathy   . Vision abnormalities     Past Surgical History  Procedure Laterality Date  . Kidney stone surgery      There were no vitals taken for this visit.  Visit Diagnosis:  Reduced active range of joint movement  Muscle stiffness      Subjective Assessment - 10/13/14 1649    Symptoms No new complaints. Reports using stander at home 6-7 times a day for toileting, showers and chair transers, not a hoyer lift.    Currently in Pain? Yes   Pain Score 8    Pain Location Hand   Pain Orientation Right;Left   Pain Descriptors / Indicators Aching   Pain Type Acute pain   Pain Onset 1 to 4 weeks ago     Treatment: Educated pt and caregiver in passive heel cord, hamstring, hip adductors and hip abductors/IT band stretches to bil legs. Held each for 30 sec's to 1 minute x 3-5 reps each.  Pt reports mostly getting knee pain from being seated in wheelchair all day. Discussed propping her feet up on a chair or foot stool through out the day to decrease this pain and help with tightness and stretching as well. Pt also wants to figure out a  way to get on/off public toilets due to traveling so much. Discussed various ways. Pt has not used a transfer board before. Spouse currently lifting her and not very smoothly. Will plan to address this next session.         PT Long Term Goals - 10/02/14 1641    PT LONG TERM GOAL #1   Title To be met by 11/09/15: Pt and caregiver to demonstrate correct performance of stretching HEP to reduce stiffness and spasticity and increase comfort..   PT LONG TERM GOAL #2   Title To be met by 11/09/15: Trial standing frame and determine if this would be beneficial for home use for normalization of tone, if so, acquire MD order and begin ordering process.   PT LONG TERM GOAL #3   Title To be met by 11/09/15: Pt and caregiver to verbalize and/or demonstrate body mechanics techniques to increase ease of dependent transfer without lift equipment for decreased burden of care and increased safety of patient when traveling without mechanical lift.         Plan - 10/13/14 1701    Clinical Impression Statement Educated on and issued stretching for her lower extremities  today without issues reported.   Rehab Potential Fair   Clinical Impairments Affecting Rehab Potential chronicity of deficits  PT Frequency 2x / week   PT Duration 4 weeks   PT Treatment/Interventions ADLs/Self Care Home Management;Electrical Stimulation;Therapeutic exercise;Patient/family education;Balance training;Passive range of motion;Manual techniques;Neuromuscular re-education;Functional mobility training;DME Instruction;Therapeutic activities   PT Next Visit Plan attempt ankle pumps in supine. Determine if pt can achieve trace contraction in supine and if so, add LE and core contraction to HEP. Educated on ex's that can be done in her standing frame at home.   Consulted and Agree with Plan of Care Patient     Problem List Patient Active Problem List   Diagnosis Date Noted  . Multiple sclerosis 09/07/2014  . 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 Dysesthesia 09/07/2014  . Depression 09/07/2014    ,  10/13/2014, 5:02 PM   , PTA, [facility] [address], [address] [place], [place] [address] [phone] 10/13/2014, 5:02 PM     

## 2014-10-20 ENCOUNTER — Encounter: Payer: Self-pay | Admitting: Physical Therapy

## 2014-10-20 ENCOUNTER — Ambulatory Visit: Payer: Managed Care, Other (non HMO) | Admitting: Occupational Therapy

## 2014-10-20 ENCOUNTER — Ambulatory Visit: Payer: Managed Care, Other (non HMO) | Admitting: Physical Therapy

## 2014-10-20 DIAGNOSIS — R29898 Other symptoms and signs involving the musculoskeletal system: Secondary | ICD-10-CM

## 2014-10-20 DIAGNOSIS — M62838 Other muscle spasm: Secondary | ICD-10-CM

## 2014-10-20 DIAGNOSIS — R2689 Other abnormalities of gait and mobility: Secondary | ICD-10-CM | POA: Diagnosis not present

## 2014-10-20 DIAGNOSIS — M6289 Other specified disorders of muscle: Secondary | ICD-10-CM

## 2014-10-20 NOTE — Therapy (Signed)
[facility] [address] [place], [place], [address] Phone: [phone]   Fax:  [phone]  Physical Therapy Treatment  Patient Details  Name: Vanessa Short MRN: [id number] Date of Birth: 1966/07/23 Referring Provider:  Britt Bottom, MD  Encounter Date: 10/20/2014      PT End of Session - 10/20/14 1407    Visit Number 3   Number of Visits 9   Date for PT Re-Evaluation 12/01/14   Authorization Type [employer] 120 visit limit PT/OT combined   PT Start Time 1402   PT Stop Time 1445   PT Time Calculation (min) 43 min   Equipment Utilized During Treatment Gait belt   Activity Tolerance Patient tolerated treatment well   Behavior During Therapy [facility] for tasks assessed/performed      Past Medical History  Diagnosis Date  . Multiple sclerosis   . Ulcerative colitis   . Hypertension   . Headache   . Movement disorder   . Neuropathy   . Vision abnormalities     Past Surgical History  Procedure Laterality Date  . Kidney stone surgery      There were no vitals taken for this visit.  Visit Diagnosis:  Muscle stiffness  Decreased functional mobility      Subjective Assessment - 10/20/14 1405    Symptoms No new complaints. Reports low energy level today, yesterday was a better day. Has not had a chance to do the stretches at home to date with caregiver.   Currently in Pain? Yes   Pain Score 4    Pain Location Hand   Pain Orientation Right   Pain Descriptors / Indicators Aching;Sore   Pain Type Acute pain   Pain Onset 1 to 4 weeks ago   Pain Frequency Intermittent   Aggravating Factors  cold, when it "clenches up"   Pain Relieving Factors relaxing     Treatment Transfers: - scooting forward and backward in power chair x 3 each way with max to total assist for weight shifting and movement. Pt unable to self stabilize her trunk unsupported and needed constant assistance for this.  - sit/stand x 1 rep  to/from power chair. bil knees blocked and max to total assist. Pt able to hold on with left hand only, however pt provided no physical assistance. Was able to achieve full standing, however due to amount of assist needed was not safe to attempt to pivot to mat table.  - attempted to use our standing frame. Unable to get frame close enough to pt in her power chair to safely attempt to stand. Pt with at least 1 foot of space still between her knees and the frame's knee guards.   - attempted to use hoyer lift for power chair to mat transfer however pt's home sling with different hooks than ours takes and not compatible. At this time unable to place our sling due to out of treatment time.   Self Care - reviewed stretching home exercise program and encouraged pt and caregiver to perform this at home. Also discussed possibility of pt self catheterizing herself for travel so to not have to transfer without her lifts. Pt has not done this before and is against trying it only because she is fearful it will get dislodged with airport personnel transferring her into a plane seat.      PT Long Term Goals - 10/02/14 1641    PT LONG TERM GOAL #1   Title To be met by 11/09/15:  Pt and caregiver to demonstrate correct performance of stretching HEP to reduce stiffness and spasticity and increase comfort..   PT LONG TERM GOAL #2   Title To be met by 11/09/15: Trial standing frame and determine if this would be beneficial for home use for normalization of tone, if so, acquire MD order and begin ordering process.   PT LONG TERM GOAL #3   Title To be met by 11/09/15: Pt and caregiver to verbalize and/or demonstrate body mechanics techniques to increase ease of dependent transfer without lift equipment for decreased burden of care and increased safety of patient when traveling without mechanical lift.           Plan - 10/20/14 1408    Clinical Impression Statement Spent most of session problem solving best transfer  options for pt and spouse to decrease burdon of care at home. Reinforced importance of HEP compliance as well to decrease tightness in legs.   Rehab Potential Fair   Clinical Impairments Affecting Rehab Potential chronicity of deficits   PT Frequency 2x / week   PT Duration 4 weeks   PT Treatment/Interventions ADLs/Self Care Home Management;Electrical Stimulation;Therapeutic exercise;Patient/family education;Balance training;Passive range of motion;Manual techniques;Neuromuscular re-education;Functional mobility training;DME Instruction;Therapeutic activities   PT Next Visit Plan try a slide board transfer with 2 person assist. attempt ankle pumps in supine. Determine if pt can achieve trace contraction in supine and if so, add LE and core contraction to HEP. Educated on ex's that can be done in her standing frame at home.   Consulted and Agree with Plan of Care Patient      Problem List Patient Active Problem List   Diagnosis Date Noted  . Multiple sclerosis 09/07/2014  . 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 Dysesthesia 09/07/2014  . Depression 09/07/2014    Willow Ora 10/21/2014, 9:30 AM  Willow Ora, PTA, [facility] [address] [place],  [address] [phone] 10/21/2014, 9:30 AM

## 2014-10-21 NOTE — Therapy (Signed)
[facility] [address] [place], [place], [address] Phone: [phone]   Fax:  [phone]  Occupational Therapy Treatment  Patient Details  Name: Vanessa Short MRN: [id number] Date of Birth: 27-Apr-1966 Referring Provider:  Britt Bottom, MD  Encounter Date: 10/20/2014      OT End of Session - 10/20/14 1324    Visit Number 3   Number of Visits 17   Date for OT Re-Evaluation 11/27/14   Authorization Type Aetna - 120 combined visit total   OT Start Time 1321   OT Stop Time 1400   OT Time Calculation (min) 39 min      Past Medical History  Diagnosis Date  . Multiple sclerosis   . Ulcerative colitis   . Hypertension   . Headache   . Movement disorder   . Neuropathy   . Vision abnormalities     Past Surgical History  Procedure Laterality Date  . Kidney stone surgery      There were no vitals taken for this visit.  Visit Diagnosis:  Weakness of both upper extremities  Muscle stiffness  Muscle spasticity      Subjective Assessment - 10/20/14 1323    Symptoms I felt better yesterday than today.   Pertinent History see epic snapshot   Currently in Pain? Yes   Pain Score 4    Pain Orientation Right;Left   Pain Descriptors / Indicators Aching   Pain Type Acute pain   Pain Onset 1 to 4 weeks ago   Aggravating Factors  stress, heat , over use of hands   Pain Relieving Factors relaxing       Treatment: Pt practiced with built up foam grip on spoon to keep spoon from slipping in her hand. Pt was issued foam grips for trial at home. Therapist initiated fabrication of resting hand splint for improved LUE positioning. Therapist did not complete due to time constraints.                    OT Short Term Goals - 10/13/14 1702    OT SHORT TERM GOAL #1   Title Patient will be independent with home activity program   Time 4   Period Weeks   Status New   OT SHORT TERM GOAL #2   Title  Patient will demonstrate improved active finger extension in right hand to allow her to shape around cup for drinking   Time 4   Period Weeks   Status New   OT SHORT TERM GOAL #3   Title Patient will demonstrate adequate grasp and control to manipulate eating utensils (built up) to bring non - finger foods to mouth after set up assistance   Time 4   Period Weeks   Status New   OT SHORT TERM GOAL #4   Title Patient will demonstrate adequate sitting balance to except small weight shifts in any direction to improve safety with functional tasks such as toileting   Time 4   Period Weeks   Status New           OT Long Term Goals - 10/13/14 1702    OT LONG TERM GOAL #1   Title Patient will be independent with updated home exercise program   Time 8   Period Weeks   Status On-going   OT LONG TERM GOAL #2   Title Patient will demonstarte effective ability to guide aide to complete PROM program for bilateral upper extremities  Time 8   Period Weeks   Status On-going   OT LONG TERM GOAL #3   Title Patient will demonstrate effective use of hands to hold phone to ear (revised per pt request)   Time 8   Period Weeks   Status New   OT LONG TERM GOAL #4   Title Patient will explore options to return to scrapbooking with moderate assistance from family or aide.     Status On-going               Plan - 10/21/14 0837    Clinical Impression Statement Therapist initated resting hand splint for improved LUE positioning due to spasticity.   Pt will benefit from skilled therapeutic intervention in order to improve on the following deficits (Retired) Decreased activity tolerance;Decreased balance;Decreased cognition;Decreased knowledge of use of DME;Decreased endurance;Decreased coordination;Decreased range of motion;Decreased strength;Impaired flexibility;Impaired sensation;Impaired tone;Impaired UE functional use;Improper body mechanics;Obesity   Plan finish LUE resting hand splint and  educate caregiver in application.   Consulted and Agree with Plan of Care Patient        Problem List Patient Active Problem List   Diagnosis Date Noted  . Multiple sclerosis 09/07/2014  . 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 Dysesthesia 09/07/2014  . Depression 09/07/2014    Shanta Dorvil 10/21/2014, 8:40 AM Theone Murdoch, OTR/L [phone] Phone: [phone] 8:40 AM 10/21/2014 [facility] [address] [place], [place], [address] Phone: [phone]   Fax:  [phone]

## 2014-10-22 ENCOUNTER — Encounter: Payer: Self-pay | Admitting: Physical Therapy

## 2014-10-22 ENCOUNTER — Ambulatory Visit: Payer: Managed Care, Other (non HMO) | Admitting: Occupational Therapy

## 2014-10-22 ENCOUNTER — Ambulatory Visit: Payer: Managed Care, Other (non HMO) | Admitting: Physical Therapy

## 2014-10-22 DIAGNOSIS — M6289 Other specified disorders of muscle: Secondary | ICD-10-CM

## 2014-10-22 DIAGNOSIS — R2689 Other abnormalities of gait and mobility: Secondary | ICD-10-CM

## 2014-10-22 DIAGNOSIS — R29898 Other symptoms and signs involving the musculoskeletal system: Secondary | ICD-10-CM

## 2014-10-22 DIAGNOSIS — M2560 Stiffness of unspecified joint, not elsewhere classified: Secondary | ICD-10-CM

## 2014-10-22 DIAGNOSIS — M62838 Other muscle spasm: Secondary | ICD-10-CM

## 2014-10-22 DIAGNOSIS — M6281 Muscle weakness (generalized): Secondary | ICD-10-CM

## 2014-10-22 NOTE — Therapy (Signed)
[facility] [address] [place], [place], [address] Phone: [phone]   Fax:  [phone]  Physical Therapy Treatment  Patient Details  Name: Vanessa Short MRN: [id number] Date of Birth: 04/03/1966 Referring Provider:  Britt Bottom, MD  Encounter Date: 10/22/2014      PT End of Session - 10/22/14 1633    Visit Number 4   Number of Visits 9   Date for PT Re-Evaluation 12/01/14   Authorization Type Aetna 120 visit limit PT/OT combined   PT Start Time 1446   PT Stop Time 5188   PT Time Calculation (min) 49 min      Past Medical History  Diagnosis Date  . Multiple sclerosis   . Ulcerative colitis   . Hypertension   . Headache   . Movement disorder   . Neuropathy   . Vision abnormalities     Past Surgical History  Procedure Laterality Date  . Kidney stone surgery      There were no vitals filed for this visit.  Visit Diagnosis:  Muscle weakness of lower extremity  Decreased functional mobility      Subjective Assessment - 10/22/14 1632    Symptoms Pt. states she is tired today due to warmer weather; RUE is tired because she was writing checks earlier today   Pertinent History Scoliotic curve toward left, depression   Patient Stated Goals To decrease spasiticity and increase strength.    Currently in Pain? No/denies      TherAct; pt. Transferred wheelchair to mat with sliding board with +3 max assist; high low table was raised for pt. To  Transfer back into wheelchair with +2 max assist  NeuroRe-ed:  Sitting balance activities with UE support with mod assist to min assist; leaning down on R and L forearm X 5 reps with return to upright with max assist;  Leaning back on chair with pillows with return to upright with mod assist X 10 reps for trunk control and strengthening  TherEx:  Bil. LE hamstring stretches x 30 sec hold x 3 reps each leg; passive hip/knee flexion/extension x 10 reps; Bil.  Heel cord stretching x 30 sec hold ; trunk rotation stretch right and left sides 30 sec hold                              PT Long Term Goals - 10/02/14 1641    PT LONG TERM GOAL #1   Title To be met by 11/09/15: Pt and caregiver to demonstrate correct performance of stretching HEP to reduce stiffness and spasticity and increase comfort..   PT LONG TERM GOAL #2   Title To be met by 11/09/15: Trial standing frame and determine if this would be beneficial for home use for normalization of tone, if so, acquire MD order and begin ordering process.   PT LONG TERM GOAL #3   Title To be met by 11/09/15: Pt and caregiver to verbalize and/or demonstrate body mechanics techniques to increase ease of dependent transfer without lift equipment for decreased burden of care and increased safety of patient when traveling without mechanical lift.               Plan - 10/22/14 1636    Clinical Impression Statement Pt. able to assist very minimally in sliding board transfer - is total assist for wheelchair to/from mat with use of board; decr. trunk strength noted but pt. is able  to sit with bil. UE support on sode of mat   Rehab Potential Fair   PT Frequency 2x / week   PT Duration 4 weeks   PT Treatment/Interventions ADLs/Self Care Home Management;Electrical Stimulation;Therapeutic exercise;Patient/family education;Balance training;Passive range of motion;Manual techniques;Neuromuscular re-education;Functional mobility training;DME Instruction;Therapeutic activities   PT Next Visit Plan cont. with sliding board transfers; bil. LE PROM and stretches   Consulted and Agree with Plan of Care Patient        Problem List Patient Active Problem List   Diagnosis Date Noted  . Multiple sclerosis 09/07/2014  . 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 Dysesthesia 09/07/2014  . Depression 09/07/2014    Alda Lea, PT 10/22/2014, 4:40 PM  [facility] [address] [place], [place], [address] Phone: [phone]   Fax:  [phone]

## 2014-10-22 NOTE — Therapy (Signed)
[facility] [address] [place], [place], [address] Phone: [phone]   Fax:  [phone]  Occupational Therapy Treatment  Patient Details  Name: Vanessa Short MRN: [id number] Date of Birth: 1966/07/11 Referring Provider:  Britt Bottom, MD  Encounter Date: 10/22/2014      OT End of Session - 10/22/14 1641    Visit Number 4   Number of Visits 17   Date for OT Re-Evaluation 11/27/14   Authorization Type Aetna - 120 combined visit total   OT Start Time 1535   OT Stop Time 1624   OT Time Calculation (min) 49 min   Activity Tolerance Patient tolerated treatment well   Behavior During Therapy [facility] for tasks assessed/performed      Past Medical History  Diagnosis Date  . Multiple sclerosis   . Ulcerative colitis   . Hypertension   . Headache   . Movement disorder   . Neuropathy   . Vision abnormalities     Past Surgical History  Procedure Laterality Date  . Kidney stone surgery      There were no vitals filed for this visit.  Visit Diagnosis:  Weakness of both upper extremities  Muscle stiffness  Muscle spasticity  Reduced active range of joint movement      Subjective Assessment - 10/22/14 1638    Pertinent History see epic snapshot   Currently in Pain? Yes   Pain Score 4    Pain Descriptors / Indicators Aching   Pain Type Acute pain   Pain Onset More than a month ago   Aggravating Factors  warmer temperatures   Pain Relieving Factors rest     Treatment: Therapist finished fabrication/ fitting of resting hand splint for improved LUE positioning. Pt and caregiver, Beau Fanny were educated in splint wear, care and precautions. Tameka returned demonstration of application of splint.                       OT Education - 10/22/14 1640    Education provided Yes   Education Details left resting hand splint   Person(s) Educated Patient;Caregiver(s)   Methods  Explanation;Demonstration;Verbal cues;Handout   Comprehension Verbalized understanding;Returned demonstration          OT Short Term Goals - 10/13/14 1702    OT SHORT TERM GOAL #1   Title Patient will be independent with home activity program   Time 4   Period Weeks   Status New   OT SHORT TERM GOAL #2   Title Patient will demonstrate improved active finger extension in right hand to allow her to shape around cup for drinking   Time 4   Period Weeks   Status New   OT SHORT TERM GOAL #3   Title Patient will demonstrate adequate grasp and control to manipulate eating utensils (built up) to bring non - finger foods to mouth after set up assistance   Time 4   Period Weeks   Status New   OT SHORT TERM GOAL #4   Title Patient will demonstrate adequate sitting balance to except small weight shifts in any direction to improve safety with functional tasks such as toileting   Time 4   Period Weeks   Status New           OT Long Term Goals - 10/13/14 1702    OT LONG TERM GOAL #1   Title Patient will be independent with updated home exercise program   Time 8  Period Weeks   Status On-going   OT LONG TERM GOAL #2   Title Patient will demonstarte effective ability to guide aide to complete PROM program for bilateral upper extremities   Time 8   Period Weeks   Status On-going   OT LONG TERM GOAL #3   Title Patient will demonstrate effective use of hands to hold phone to ear (revised per pt request)   Time 8   Period Weeks   Status New   OT LONG TERM GOAL #4   Title Patient will explore options to return to scrapbooking with moderate assistance from family or aide.     Status On-going               Plan - 10/22/14 1642    Clinical Impression Statement Therapist issued left resting hand splint for improved left hand positioning and ROM.   Plan L resting hand splint check, HEP   Consulted and Agree with Plan of Care Patient;Family member/caregiver   Family Member  Consulted Tameka        Problem List Patient Active Problem List   Diagnosis Date Noted  . Multiple sclerosis 09/07/2014  . 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 Dysesthesia 09/07/2014  . Depression 09/07/2014    RINE,KATHRYN 10/22/2014, 4:55 PM Theone Murdoch, OTR/L [phone] Phone: [phone] 4:55 PM 10/22/2014 [facility] [address] [place], [place], [address] Phone: [phone]   Fax:  [phone]

## 2014-10-22 NOTE — Patient Instructions (Addendum)
WEARING SCHEDULE:  Wear splint initally 1-2 hours while awake, remove splint, check skin for pressure areas. If problems remove splint and stop wearing!  if no problems gradually increase wear time to 4-6 hrs during day. If able to tolerate 4-6 hrs  without problems, progress to night time wear. PURPOSE:  Improved positioning  CARE OF SPLINT:  Keep splint away from heat sources including: stove, radiator or furnace, or a car in sunlight. The splint can melt and will no longer fit you properly  Keep away from pets and children  Clean the splint with rubbing alcohol 1-2 times per day.  * During this time, make sure you also clean your hand/arm as instructed by your therapist and/or perform dressing changes as needed. Then dry hand/arm completely before replacing splint.  PRECAUTIONS/POTENTIAL PROBLEMS: *If you notice or experience increased pain, swelling, numbness, or a lingering reddened area from the splint: Contact your therapist immediately by calling [phone].  If you have any medical concerns or signs of infection, please call your doctor immediately

## 2014-10-26 ENCOUNTER — Telehealth: Payer: Self-pay | Admitting: *Deleted

## 2014-10-26 NOTE — Telephone Encounter (Signed)
Patient current Ins did not transfer over to this office and patient will have her ins company fax over a sheet that will need to be filled out.

## 2014-10-26 NOTE — Telephone Encounter (Signed)
Noted.  Will watch for paperwork/fim

## 2014-10-27 ENCOUNTER — Ambulatory Visit: Payer: Managed Care, Other (non HMO) | Admitting: Occupational Therapy

## 2014-10-27 ENCOUNTER — Encounter: Payer: Self-pay | Admitting: Occupational Therapy

## 2014-10-27 ENCOUNTER — Ambulatory Visit: Payer: Managed Care, Other (non HMO)

## 2014-10-27 DIAGNOSIS — R293 Abnormal posture: Secondary | ICD-10-CM

## 2014-10-27 DIAGNOSIS — R29898 Other symptoms and signs involving the musculoskeletal system: Secondary | ICD-10-CM

## 2014-10-27 DIAGNOSIS — M2560 Stiffness of unspecified joint, not elsewhere classified: Secondary | ICD-10-CM

## 2014-10-27 DIAGNOSIS — R2689 Other abnormalities of gait and mobility: Secondary | ICD-10-CM

## 2014-10-27 DIAGNOSIS — M62838 Other muscle spasm: Secondary | ICD-10-CM

## 2014-10-27 NOTE — Therapy (Signed)
[facility] [address] [place], [place], [address] Phone: [phone]   Fax:  [phone]  Physical Therapy Treatment  Patient Details  Name: Vanessa Short MRN: [id number] Date of Birth: September 13, 1965 Referring Provider:  Britt Bottom, MD  Encounter Date: 10/27/2014      PT End of Session - 10/27/14 1611    Visit Number 5   Number of Visits 9   Date for PT Re-Evaluation 12/01/14   Authorization Type A[employer] 120 visit limit PT/OT combined   PT Start Time 6301   PT Stop Time 1450   PT Time Calculation (min) 45 min   Equipment Utilized During Treatment Gait belt      Past Medical History  Diagnosis Date  . Multiple sclerosis   . Ulcerative colitis   . Hypertension   . Headache   . Movement disorder   . Neuropathy   . Vision abnormalities     Past Surgical History  Procedure Laterality Date  . Kidney stone surgery      There were no vitals filed for this visit.  Visit Diagnosis:  Decreased functional mobility  Postural instability      Subjective Assessment - 10/27/14 1410    Symptoms Pt experiencing stressors due to changes in her neurologist insurance coverage.    Currently in Pain? Yes   Pain Score 3    Pain Location Knee   Pain Orientation Right;Left   Pain Descriptors / Indicators --  stiffness   Pain Type Chronic pain   Aggravating Factors  sitting for prolonged periods       Theract Pt initially refused to be transferred out of WC to therapy mat due to fatigue. Once therapist explained that at this point in her rehab she would not benefit from the session in the wheelchair, pt was agreeable to slide board transfer.  Transfer WC to mat required 2 person total assist using slideboard and slight downward slope. At end of session, transfer mat to [facility] on downward slope required 3 person total assist. Additional time and two person total assist for positioning pt in chair.  Neuro re-ed Pt  performed modified crunches on edge of mat with inverted chair for reclined back support and MOD A plus facilitory cues to rectus abdominis 2 sets of 5. Also performed 2 sets of 5 of lateral modified crunches each side with MOD A and tactile facilitatory cues. Pt responds well to tactile facilitation. Also requires frequent rest breaks.                            PT Long Term Goals - 10/02/14 1641    PT LONG TERM GOAL #1   Title To be met by 11/09/15: Pt and caregiver to demonstrate correct performance of stretching HEP to reduce stiffness and spasticity and increase comfort..   PT LONG TERM GOAL #2   Title To be met by 11/09/15: Trial standing frame and determine if this would be beneficial for home use for normalization of tone, if so, acquire MD order and begin ordering process.   PT LONG TERM GOAL #3   Title To be met by 11/09/15: Pt and caregiver to verbalize and/or demonstrate body mechanics techniques to increase ease of dependent transfer without lift equipment for decreased burden of care and increased safety of patient when traveling without mechanical lift.               Plan -  10/27/14 1613    Clinical Impression Statement Pt continues to assist minimally during slide board transfer. Transfer requires 2-3 persons. She has minimal trunk control available but responds to tactile cues to abdominals. She requires maximal encouragement to participate in activities in therapy session.   PT Next Visit Plan cont. with sliding board transfers; bil. LE PROM and stretches        Problem List Patient Active Problem List   Diagnosis Date Noted  . Multiple sclerosis 09/07/2014  . 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 Dysesthesia 09/07/2014  . Depression 09/07/2014   Delrae Sawyers, PT,DPT,NCS 10/27/2014 4:20 PM Phone [phone] FAX [phone]         [facility] [address] [place], [place], [address] Phone: [phone]   Fax:  [phone]

## 2014-10-27 NOTE — Therapy (Signed)
[facility] [address] [place], [place], [address] Phone: [phone]   Fax:  [phone]  Occupational Therapy Treatment  Patient Details  Name: Vanessa Short MRN: [id number] Date of Birth: [birth year] Referring Provider:  Britt Bottom, MD  Encounter Date: 10/27/2014      OT End of Session - 10/27/14 1547    Visit Number 5   Number of Visits 17   Date for OT Re-Evaluation 11/27/14   Authorization Type [employer]etna - 120 combined visit total   OT Start Time 1446   OT Stop Time 1532   OT Time Calculation (min) 46 min   Activity Tolerance Patient tolerated treatment well      Past Medical History  Diagnosis Date  . Multiple sclerosis   . Ulcerative colitis   . Hypertension   . Headache   . Movement disorder   . Neuropathy   . Vision abnormalities     Past Surgical History  Procedure Laterality Date  . Kidney stone surgery      There were no vitals filed for this visit.  Visit Diagnosis:  Muscle spasticity  Weakness of both upper extremities  Reduced active range of joint movement      Subjective Assessment - 10/27/14 1453    Pertinent History see epic snapshot   Currently in Pain? Yes   Pain Score 3    Pain Location Knee  see PT note   Pain Orientation Right;Left                    OT Treatments/Exercises (OP) - 10/27/14 0001    Exercises   Exercises Shoulder   Shoulder Exercises: Seated   Other Seated Exercises Provided pt with HEP for stretching to decrease tone/maintain ROM in UE's - pt able to participate in entire program. See pt instructions for details on exercises/programs as well as reps.    Splinting   Splinting Pt was made  a resting hand splint last visit. Pt working on building up tolerance. Today had splint on for approximately 3 hours - no red areas noted. Pt to build up wearing tolerance during day and will start to wear at night once husband returns from business  trip next week.                  OT Short Term Goals - 10/27/14 1549    OT SHORT TERM GOAL #1   Title Patient will be independent with home activity program - 10/30/2014   Time 4   Period Weeks   Status On-going   OT SHORT TERM GOAL #2   Title Patient will demonstrate improved active finger extension in right hand to allow her to shape around cup for drinking   Time 4   Period Weeks   Status On-going   OT SHORT TERM GOAL #3   Title Patient will demonstrate adequate grasp and control to manipulate eating utensils (built up) to bring non - finger foods to mouth after set up assistance   Time 4   Period Weeks   Status On-going   OT SHORT TERM GOAL #4   Title Patient will demonstrate adequate sitting balance to except small weight shifts in any direction to improve safety with functional tasks such as toileting   Time 4   Period Weeks   Status On-going   OT SHORT TERM GOAL #5   Status On-going           OT Long Term  Goals - 10/27/14 1550    OT LONG TERM GOAL #1   Title Patient will be independent with updated home exercise program - 11/27/2014   Time 8   Period Weeks   Status On-going   OT LONG TERM GOAL #2   Title Patient will demonstarte effective ability to guide aide to complete PROM program for bilateral upper extremities   Time 8   Period Weeks   Status On-going   OT LONG TERM GOAL #3   Title Patient will demonstrate effective use of hands to hold phone to ear (revised per pt request)   Time 8   Period Weeks   Status New   OT LONG TERM GOAL #4   Title Patient will explore options to return to scrapbooking with moderate assistance from family or aide.     Status On-going               Plan - 10/27/14 1548    Clinical Impression Statement Pt making progress toward LTG's. Pt given HEP and felt she could instruct aides in assisting her at home.   Pt will benefit from skilled therapeutic intervention in order to improve on the following deficits  (Retired) Decreased activity tolerance;Decreased balance;Decreased cognition;Decreased knowledge of use of DME;Decreased endurance;Decreased coordination;Decreased range of motion;Decreased strength;Impaired flexibility;Impaired sensation;Impaired tone;Impaired UE functional use;Improper body mechanics;Obesity   Rehab Potential Good   OT Frequency 2x / week   OT Duration 8 weeks   OT Treatment/Interventions Self-care/ADL training;Electrical Stimulation;Moist Heat;Fluidtherapy;Therapeutic exercise;Neuromuscular education;Energy conservation;Functional Mobility Training;DME and/or AE instruction;Passive range of motion;Cognitive remediation/compensation;Therapeutic activities;Therapeutic exercises;Balance training;Patient/family education;Splinting   Plan check HEP, neuro re ed to address increased UE function   Consulted and Agree with Plan of Care Patient        Problem List Patient Active Problem List   Diagnosis Date Noted  . Multiple sclerosis 09/07/2014  . 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 Dysesthesia 09/07/2014  . Depression 09/07/2014    Quay Burow, OTR/L 10/27/2014, 3:52 PM  [facility] [address] [place], [place], [address] Phone: [phone]   Fax:  [phone]

## 2014-10-27 NOTE — Patient Instructions (Signed)
Home Stretching Program for UE's:  Do 1-2 times per day.    1. Start with shoulders first and work toward fingers! 2. Can do sitting up in chair or laying in bed. 3.  Shoulders - helper holds above the elbow and at the hand. Gentle distraction then keeping elbow straight raise arm up SLOWLY above your head as long as there is no pain. Hold for count of 5 when above head, then slowly lower. Once you have done 10 passively, do 10 actively. Helper helps with the weight of the arm but is the "folllower" in the dance. 4. Shoulders - lift out to side to about 90 degrees, hold for count of 5 and return. Do 10 passively, then 10 actively. 5. Forearms - Supination (palms up) and pronation (palms down).  Helper holds at elbow and as if they are shaking your hand. Slowly turn palm up, hold for count of 5, then turn palm down. Do 10 passively then 10 actively. 6.  Wrist - extension and flexion - go slowly for 10 passively then 10 actively on right. 7.  Fingers - if fingers are really tight, bend wrist first then open fingers.  Slowly stretch fingers into extension. As long as you have no pain, you can do wrist and finger extension at the same time. Hold for count of 5 at end of extension.  Do 10 passively, then 10 actively on the right.

## 2014-10-28 NOTE — Telephone Encounter (Signed)
Spoke with Vanessa Short to let her know I have not received paperwork from her insurance company.  She sts. paperwork came to her--she has not brought it in yet b/c her printer is out of ink.  She will bring it in asap/fim

## 2014-10-29 ENCOUNTER — Ambulatory Visit: Payer: Managed Care, Other (non HMO) | Admitting: Occupational Therapy

## 2014-10-29 DIAGNOSIS — M6289 Other specified disorders of muscle: Secondary | ICD-10-CM

## 2014-10-29 DIAGNOSIS — M62838 Other muscle spasm: Secondary | ICD-10-CM

## 2014-10-29 DIAGNOSIS — R2689 Other abnormalities of gait and mobility: Secondary | ICD-10-CM | POA: Diagnosis not present

## 2014-10-29 DIAGNOSIS — R29898 Other symptoms and signs involving the musculoskeletal system: Secondary | ICD-10-CM

## 2014-10-29 DIAGNOSIS — M2560 Stiffness of unspecified joint, not elsewhere classified: Secondary | ICD-10-CM

## 2014-10-29 NOTE — Therapy (Signed)
[facility] [address] [place], [place], [address] Phone: [phone]   Fax:  [phone]  Occupational Therapy Treatment  Patient Details  Name: Vanessa Short MRN: [id number] Date of Birth: 06-Jun-1966 Referring Provider:  Britt Bottom, MD  Encounter Date: 10/29/2014      OT End of Session - 10/29/14 1320    Visit Number 6   Number of Visits 17   Date for OT Re-Evaluation 11/27/14   Authorization Type Aetna - 120 combined visit total   OT Start Time 0938   OT Stop Time 1357   OT Time Calculation (min) 40 min      Past Medical History  Diagnosis Date  . Multiple sclerosis   . Ulcerative colitis   . Hypertension   . Headache   . Movement disorder   . Neuropathy   . Vision abnormalities     Past Surgical History  Procedure Laterality Date  . Kidney stone surgery      There were no vitals filed for this visit.  Visit Diagnosis:  Weakness of both upper extremities  Muscle spasticity  Reduced active range of joint movement  Muscle stiffness      Subjective Assessment - 10/29/14 1318    Symptoms Pt reports wearing splint for up to 6 hours.   Pertinent History see epic snapshot   Currently in Pain? No/denies         Treatment: Therapist checked progress towards short term goals. Therapist demonstrated HEP(stretching) for pt's caregiver as pt reported difficulty instructing her at home.  Pt's caregiver declined practice. Pt reporved wooden dowel pegs from pegboard with RUE.Therapist initiated fabrication/ fitting of resting hand splint yet did not complete due to time constraints.                    OT Education - 10/29/14 1640    Education provided Yes   Education Details Stretching HEP, reviewed with pt/ caregiver- caregiver declined practice   Person(s) Educated Patient   Methods Explanation;Demonstration;Verbal cues   Comprehension Verbalized understanding          OT Short Term Goals - 10/29/14 1344    OT SHORT TERM GOAL #1   Title Patient will be independent with home activity program - 10/30/2014   Baseline hstretching program issued and reviewed with caregiver 10/29/14   Time 4   Period Weeks   Status On-going   OT SHORT TERM GOAL #2   Title Patient will demonstrate improved active finger extension in right hand to allow her to shape around cup for drinking   Baseline for a small cup   Time 4   Period Weeks   Status Achieved   OT SHORT TERM GOAL #3   Title Patient will demonstrate adequate grasp and control to manipulate eating utensils (built up) to bring non - finger foods to mouth after set up assistance   Time 4   Period Weeks   Status Partially Met   OT SHORT TERM GOAL #4   Title Patient will demonstrate adequate sitting balance to except small weight shifts in any direction to improve safety with functional tasks such as toileting   Baseline not fully addressed   Time 4   Period Weeks   Status On-going   OT SHORT TERM GOAL #5   Status --           OT Long Term Goals - 10/29/14 1648    OT LONG TERM GOAL #1  Title Patient will be independent with updated home exercise program - 11/27/2014   Time 8   Period Weeks   Status On-going   OT LONG TERM GOAL #2   Title Patient will demonstarte effective ability to guide aide to complete PROM program for bilateral upper extremities   Time 8   Period Weeks   Status On-going   OT LONG TERM GOAL #3   Title Patient will demonstrate effective use of hands to hold phone to ear (revised per pt request)   Time 8   Period Weeks   Status On-going   OT LONG TERM GOAL #4   Title Patient will explore options to return to scrapbooking with moderate assistance from family or aide.     Status On-going               Plan - 10/29/14 1642    Clinical Impression Statement Pt is progressing towards goals. Left hand appears much looser after wearing resting hand splint. Therapist  demonstrated HEP for aide.   Plan check with pt to see if aide is performing HEP correctly, if not have caregiver return demonstrate, finish resting hand splint for RUE.   OT Home Exercise Plan streching HEP issued, therapist demo for caregiver on 10/29/14   Consulted and Agree with Plan of Care Patient;Family member/caregiver   Family Member Consulted Tameka        Problem List Patient Active Problem List   Diagnosis Date Noted  . Multiple sclerosis 09/07/2014  . 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 Dysesthesia 09/07/2014  . Depression 09/07/2014    RINE,KATHRYN 10/29/2014, 4:51 PM Theone Murdoch, OTR/L [phone] Phone: [phone] 4:51 PM 10/29/2014 [facility] [address] [place], [place], [address] Phone: [phone]   Fax:  [phone]

## 2014-10-30 ENCOUNTER — Encounter: Payer: Medicare Other | Admitting: Occupational Therapy

## 2014-10-30 ENCOUNTER — Ambulatory Visit: Payer: Managed Care, Other (non HMO)

## 2014-10-30 DIAGNOSIS — R2689 Other abnormalities of gait and mobility: Secondary | ICD-10-CM | POA: Diagnosis not present

## 2014-10-30 DIAGNOSIS — M6289 Other specified disorders of muscle: Secondary | ICD-10-CM

## 2014-10-30 DIAGNOSIS — R293 Abnormal posture: Secondary | ICD-10-CM

## 2014-10-30 NOTE — Therapy (Signed)
[facility] [address] [place], [place], [address] Phone: [phone]   Fax:  [phone]  Physical Therapy Treatment  Patient Details  Name: Vanessa Short MRN: [id number] Date of Birth: 01/01/1966 Referring Provider:  Britt Bottom, MD  Encounter Date: 10/30/2014      PT End of Session - 10/30/14 1414    Visit Number 6   Number of Visits 9   Date for PT Re-Evaluation 12/01/14   Authorization Type Aetna 120 visit limit PT/OT combined   PT Start Time 1316   PT Stop Time 1410   PT Time Calculation (min) 54 min      Past Medical History  Diagnosis Date  . Multiple sclerosis   . Ulcerative colitis   . Hypertension   . Headache   . Movement disorder   . Neuropathy   . Vision abnormalities     Past Surgical History  Procedure Laterality Date  . Kidney stone surgery      There were no vitals filed for this visit.  Visit Diagnosis:  Postural instability  Decreased functional mobility  Muscle stiffness      Subjective Assessment - 10/30/14 1413    Symptoms Pt reports she didn't sleep well last night   Currently in Pain? No/denies       Slide board transfer 1x left and 1x right to slightly lower surface with two person MAX A. Pt re-educated more thoroughly on head/hips relationship and other body mechanics for transfer.  Seated weight shifting with BLE supported for improved sitting balance and posture and core strength: lateral leaning, forward leaning, and diagonal leaning with MAX A to steady and tactile cues + MOD A to facilitate muscle activity. Pt was allowed to use UE to assist with the weight shifts.   Pt and caregiver were taught stretching program for stretching the hip extensors, the hip adductors, the hip external rotators, and the gastrocs.                        PT Education - 10/30/14 1414    Education provided Yes   Education Details head hip relationship with  transfers, stretching HEP taught to caregiver--handout will be provided next session.   Person(s) Educated Patient;Caregiver(s)   Methods Explanation;Demonstration;Tactile cues;Verbal cues   Comprehension Verbalized understanding;Returned demonstration             PT Long Term Goals - 10/02/14 1641    PT LONG TERM GOAL #1   Title To be met by 11/09/15: Pt and caregiver to demonstrate correct performance of stretching HEP to reduce stiffness and spasticity and increase comfort..   PT LONG TERM GOAL #2   Title To be met by 11/09/15: Trial standing frame and determine if this would be beneficial for home use for normalization of tone, if so, acquire MD order and begin ordering process.   PT LONG TERM GOAL #3   Title To be met by 11/09/15: Pt and caregiver to verbalize and/or demonstrate body mechanics techniques to increase ease of dependent transfer without lift equipment for decreased burden of care and increased safety of patient when traveling without mechanical lift.               Plan - 10/30/14 1415    Clinical Impression Statement Pt requires 2 person MAX A with slideboard transfers. Taught caregiver stretching program. Caregiver reported that the stretches would be difficult to perform with pt in recliner and pt doesnt  use bed. Discussed how to perform stretches in recliner.   PT Next Visit Plan Provide typed HEP for lower extremity stretches. Possible d/c next visit versus finishing POC.        Problem List Patient Active Problem List   Diagnosis Date Noted  . Multiple sclerosis 09/07/2014  . 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 Dysesthesia 09/07/2014  . Depression 09/07/2014   Delrae Sawyers, PT,DPT,NCS 10/30/2014 2:27 PM Phone [phone] FAX [phone]         [facility] [address] [place], [place], [address] Phone: [phone]   Fax:  [phone]

## 2014-11-02 ENCOUNTER — Encounter: Payer: Self-pay | Admitting: Occupational Therapy

## 2014-11-02 ENCOUNTER — Ambulatory Visit: Payer: Managed Care, Other (non HMO) | Admitting: Occupational Therapy

## 2014-11-02 ENCOUNTER — Ambulatory Visit: Payer: Managed Care, Other (non HMO)

## 2014-11-02 DIAGNOSIS — R2689 Other abnormalities of gait and mobility: Secondary | ICD-10-CM

## 2014-11-02 DIAGNOSIS — M62838 Other muscle spasm: Secondary | ICD-10-CM

## 2014-11-02 DIAGNOSIS — R293 Abnormal posture: Secondary | ICD-10-CM

## 2014-11-02 NOTE — Therapy (Addendum)
[facility] [address] [place], [place], [address] Phone: [phone]   Fax:  [phone]  Physical Therapy Treatment  Patient Details  Name: Vanessa Short MRN: [id number] Date of Birth: Feb 23, 1966 Referring Provider:  Britt Bottom, MD  Encounter Date: 11/02/2014      PT End of Session - 11/02/14 1711    Visit Number 7   Number of Visits 9   Date for PT Re-Evaluation 12/01/14   Authorization Type Aetna 120 visit limit PT/OT combined   PT Start Time 1402   PT Stop Time 9381   PT Time Calculation (min) 43 min      Past Medical History  Diagnosis Date  . Multiple sclerosis   . Ulcerative colitis   . Hypertension   . Headache   . Movement disorder   . Neuropathy   . Vision abnormalities     Past Surgical History  Procedure Laterality Date  . Kidney stone surgery      There were no vitals filed for this visit.  Visit Diagnosis:  Postural instability  Decreased functional mobility      Subjective Assessment - 11/02/14 1404    Symptoms Busy weekend for her daughter's birthday party. She was sick to her stomach yesterday and didn't eat much so feeling weaker today. She was able to eat a full meal for lunch.   Currently in Pain? No/denies       Self care-Pt verbalized understanding of body mechanics for transfers including head/hips relationship. Thorough discussion of the benefit of utilizing the sit to stand lift for temporary normalization of tone to increase pt's comfort. Discussed the importance of starting with a short stand time 2-3 minutes and slowly progressing up to 10 or 15 minutes, monitoring vitals (recommended purchasing a BP cuff), and returning to sitting or laying down if she becomes dizzy, tired, or lightheade or develops a headache. Pt verbalized understanding. Discussed pt's readiness for d/c and plateau in functional return. Recommended continued core strengthening and stretching  at home. If she has a notable improvement in core strength (ie" able to weight shift against gravity), I recommended she return for PT with a new physician order.   Therex: -Discussed previously taught stretching HEP and provided pt with handout. Pt verbalized understanding of the exercises that caregiver performed previous session. -Taught pt how to perform seated weight shifting with assist from power WC. Pt is aware that she needs assist for this at this time. Also discussed how to progress this using the tilt function to increase gravity force on body weight.                        PT Education - 11/02/14 1710    Education provided Yes   Education Details HEP, using sit to stand lift to decrease tone, how to perform seated core strengthening from chair   Person(s) Educated Patient   Methods Explanation;Handout;Demonstration   Comprehension Verbalized understanding;Returned demonstration             PT Long Term Goals - 11/02/14 1714    PT LONG TERM GOAL #1   Title To be met by 11/09/15: Pt and caregiver to demonstrate correct performance of stretching HEP to reduce stiffness and spasticity and increase comfort..   Status Achieved   PT LONG TERM GOAL #2   Title To be met by 11/09/15: Trial standing frame and determine if this would be beneficial for home use for normalization  of tone, if so, acquire MD order and begin ordering process.   Status Deferred  Pt has a sit to stand lift at home   PT [place] #3   Title To be met by 11/09/15: Pt and caregiver to verbalize and/or demonstrate body mechanics techniques to increase ease of dependent transfer without lift equipment for decreased burden of care and increased safety of patient when traveling without mechanical lift.   Status Partially Met  pt understands these concepts but unable to assist with transfer due to lack of strength.                Plan - 11/02/14 1712    Clinical Impression Statement  Pt has made minimal progress with strengthening in therapy. At this point, pt is ready for d/c with home exercise program. She already has a sit to stand lift so a standing frame will not be needed. And she is aware of body mechanics for transfers but doesn't have the muscle activity/strength to assist with transfers. D/C today.   Consulted and Agree with Plan of Care Patient        Problem List Patient Active Problem List   Diagnosis Date Noted  . Multiple sclerosis 09/07/2014  . 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 Dysesthesia 09/07/2014  . Depression 09/07/2014   Late G code entry for [employer] by C.Kathlen Mody, PT Self care Goal status CK D/c status CL WEAVER,CHRISTINA, PT   Zinedine Ellner, Donalynn Furlong 11/02/2014, 5:19 PM  [facility] [address] [place], [place], [address] Phone: [phone]   Fax:  [phone]   PHYSICAL THERAPY DISCHARGE SUMMARY  Visits from Start of Care: 7  Current functional level related to goals / functional outcomes: Met one, partially met one, and deferred one of pt's 3 long term goals. See goals section above.   Remaining deficits: Pt is severely impaired in core strength and balance, and has no activation of BLE due to progressive Multiple Sclerosis. She has reached a plateau at this time with her therapy and is ready for d/c with a HEP.   Education / Equipment: HEP  Plan: Patient agrees to discharge.  Patient goals were partially met. Patient is being discharged due to meeting the stated rehab goals. and plateau in progress ?????

## 2014-11-02 NOTE — Patient Instructions (Signed)
Butterfly, Supine   Have your caregiver assist you with this. Lie on back, feet together. Lower knees toward recliner/bed. Caregiver can apply gentle overpressure for increased stretc. Hold 30 seconds. Perform 3x daily.  Copyright  VHI. All rights reserved.    CAREGIVER ASSISTED: Hamstrings - Supine   Caregiver holds leg at ankle and over knee; raises leg straight. Hold 30 seconds. 3 reps per set, 1-2 sets per day, 7 days per week   Copyright  VHI. All rights reserved.    Bridging    Knee-to-Chest Stretch: Bilateral   Have someone assist you with this: With hands behind knees, pull both knees in to chest until a comfortable stretch is felt in lower back and buttocks. Hold 30 seconds. 3 reps per set, 1-2 sets per day, 7 days per week http://orth.exer.us/129   Copyright  VHI. All rights reserved.     CAREGIVER ASSISTED: Ankle Dorsiflexion   Caregiver cups hand under heel, rests foot on forearm; leans forward to move foot toward head. Hold 30 seconds. 3 reps per set, 1-2 sets per day, 7 days per week  Copyright  VHI. All rights reserved.

## 2014-11-02 NOTE — Therapy (Signed)
[facility] [address] [place], [place], [address] Phone: [phone]   Fax:  [phone]  Occupational Therapy Treatment  Patient Details  Name: Vanessa Short MRN: [id number] Date of Birth: November 01, 1965 Referring Provider:  Britt Bottom, MD  Encounter Date: 11/02/2014      OT End of Session - 11/02/14 1712    Visit Number 7   Number of Visits 17   Date for OT Re-Evaluation 11/27/14   Authorization Type A[employer] - 120 combined visit total   OT Start Time 1449   OT Stop Time 1530   OT Time Calculation (min) 41 min   Activity Tolerance Patient tolerated treatment well      Past Medical History  Diagnosis Date  . Multiple sclerosis   . Ulcerative colitis   . Hypertension   . Headache   . Movement disorder   . Neuropathy   . Vision abnormalities     Past Surgical History  Procedure Laterality Date  . Kidney stone surgery      There were no vitals filed for this visit.  Visit Diagnosis:  Muscle spasticity      Subjective Assessment - 11/02/14 1454    Symptoms I am really tired today I had a full weekend   Pertinent History see epic snapshot   Currently in Pain? No/denies                    OT Treatments/Exercises (OP) - 11/02/14 0001    Splinting   Splinting Completed fabrication and fitting of resting hand splint for Right hand (pt had splint made for Left hand before).  Reviewed care of splint as well as builiding up tolerance to wear at night.  Pt stated she did not wear left splint "because I didn't want to embarrass my daughter." Stressed to pt that once she can tolerate 4 hours without any problems she should only wear splints at night when sleeping. Pt verbalized understanding.                   OT Short Term Goals - 11/02/14 1715    OT SHORT TERM GOAL #1   Title Patient will be independent with home activity program - 10/30/2014   Baseline hstretching program issued  and reviewed with caregiver 10/29/14   Time 4   Period Weeks   Status On-going   OT SHORT TERM GOAL #2   Title Patient will demonstrate improved active finger extension in right hand to allow her to shape around cup for drinking   Baseline for a small cup   Time 4   Period Weeks   Status Achieved   OT SHORT TERM GOAL #3   Title Patient will demonstrate adequate grasp and control to manipulate eating utensils (built up) to bring non - finger foods to mouth after set up assistance   Time 4   Period Weeks   Status Partially Met   OT SHORT TERM GOAL #4   Title Patient will demonstrate adequate sitting balance to except small weight shifts in any direction to improve safety with functional tasks such as toileting   Baseline not fully addressed   Time 4   Period Weeks   Status Deferred           OT Long Term Goals - 11/02/14 1715    OT LONG TERM GOAL #1   Title Patient will be independent with updated home exercise program - 11/27/2014   Time 8  Period Weeks   Status On-going   OT LONG TERM GOAL #2   Title Patient will demonstarte effective ability to guide aide to complete PROM program for bilateral upper extremities   Time 8   Period Weeks   Status Achieved   OT LONG TERM GOAL #3   Title Patient will demonstrate effective use of hands to hold phone to ear (revised per pt request)   Time 8   Period Weeks   Status On-going   OT LONG TERM GOAL #4   Title Patient will explore options to return to scrapbooking with moderate assistance from family or aide.     Status On-going               Plan - 11/02/14 1713    Clinical Impression Statement Pt making progress toward goals.  Pt stated she does not feel aide needs any more training but that "now we just have to get into the routine to do it."   Pt will benefit from skilled therapeutic intervention in order to improve on the following deficits (Retired) Decreased activity tolerance;Decreased balance;Decreased  cognition;Decreased knowledge of use of DME;Decreased endurance;Decreased coordination;Decreased range of motion;Decreased strength;Impaired flexibility;Impaired sensation;Impaired tone;Impaired UE functional use;Improper body mechanics;Obesity   Rehab Potential Good   OT Frequency 2x / week   OT Duration 8 weeks   OT Treatment/Interventions Self-care/ADL training;Electrical Stimulation;Moist Heat;Fluidtherapy;Therapeutic exercise;Neuromuscular education;Energy conservation;Functional Mobility Training;DME and/or AE instruction;Passive range of motion;Cognitive remediation/compensation;Therapeutic activities;Therapeutic exercises;Balance training;Patient/family education;Splinting   Plan check LTG's, UE functional use   Consulted and Agree with Plan of Care Patient        Problem List Patient Active Problem List   Diagnosis Date Noted  . Multiple sclerosis 09/07/2014  . 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 Dysesthesia 09/07/2014  . Depression 09/07/2014    Quay Burow, OTR/L 11/02/2014, 5:17 PM  [facility] [address] [place], [place], [address] Phone: [phone]   Fax:  [phone]

## 2014-11-04 ENCOUNTER — Ambulatory Visit: Payer: Medicare Other

## 2014-11-05 ENCOUNTER — Encounter: Payer: Medicare Other | Admitting: Occupational Therapy

## 2014-11-05 ENCOUNTER — Ambulatory Visit: Payer: Medicare Other

## 2014-11-09 ENCOUNTER — Ambulatory Visit: Payer: Medicare Other

## 2014-11-09 ENCOUNTER — Encounter: Payer: Self-pay | Admitting: Occupational Therapy

## 2014-11-09 ENCOUNTER — Telehealth: Payer: Self-pay | Admitting: *Deleted

## 2014-11-09 ENCOUNTER — Ambulatory Visit: Payer: Managed Care, Other (non HMO) | Admitting: Occupational Therapy

## 2014-11-09 DIAGNOSIS — R2689 Other abnormalities of gait and mobility: Secondary | ICD-10-CM | POA: Diagnosis not present

## 2014-11-09 DIAGNOSIS — R29898 Other symptoms and signs involving the musculoskeletal system: Secondary | ICD-10-CM

## 2014-11-09 DIAGNOSIS — M6289 Other specified disorders of muscle: Secondary | ICD-10-CM

## 2014-11-09 DIAGNOSIS — M62838 Other muscle spasm: Secondary | ICD-10-CM

## 2014-11-09 NOTE — Telephone Encounter (Signed)
Patient form from [place] on [facility] desk.

## 2014-11-09 NOTE — Therapy (Signed)
[facility] [address] [place], [place], [address] Phone: [phone]   Fax:  [phone]  Occupational Therapy Treatment  Patient Details  Name: Vanessa Short MRN: [id number] Date of Birth: 13-Apr-1966 Referring Provider:  Britt Bottom, MD  Encounter Date: 11/09/2014      OT End of Session - 11/09/14 1615    Visit Number 8   Number of Visits 17   Date for OT Re-Evaluation 11/27/14   Authorization Type Aetna - 120 combined visit total   OT Start Time 1332   OT Stop Time 1413   OT Time Calculation (min) 41 min   Activity Tolerance Patient tolerated treatment well      Past Medical History  Diagnosis Date  . Multiple sclerosis   . Ulcerative colitis   . Hypertension   . Headache   . Movement disorder   . Neuropathy   . Vision abnormalities     Past Surgical History  Procedure Laterality Date  . Kidney stone surgery      There were no vitals filed for this visit.  Visit Diagnosis:  Muscle spasticity  Muscle stiffness  Weakness of both upper extremities      Subjective Assessment - 11/09/14 1537    Symptoms I am doing well today I have accomplished alot.   Pertinent History see epic snapshot   Currently in Pain? No/denies                    OT Treatments/Exercises (OP) - 11/09/14 0001    ADLs   ADL Comments With UE positioning pt able to hold phone to ear with support under R elbow and leaning head to right and "propping" against phone in right hand.    Exercises   Exercises Shoulder   Shoulder Exercises: Supine   Other Supine Exercises At patient request reviewed all HEP for arms while pt was in wheelchair with pt's aide Tamika. Aide able to return demonstrate all exercises and verbalized understanding. Pt stated she felt like aide had a good understanding after coaching.                   OT Short Term Goals - 11/09/14 1616    OT SHORT TERM GOAL #1   Title  Patient will be independent with home activity program - 10/30/2014   Baseline hstretching program issued and reviewed with caregiver 10/29/14   Time 4   Period Weeks   Status On-going   OT SHORT TERM GOAL #2   Title Patient will demonstrate improved active finger extension in right hand to allow her to shape around cup for drinking   Baseline for a small cup   Time 4   Period Weeks   Status Achieved   OT SHORT TERM GOAL #3   Title Patient will demonstrate adequate grasp and control to manipulate eating utensils (built up) to bring non - finger foods to mouth after set up assistance   Time 4   Period Weeks   Status Partially Met   OT SHORT TERM GOAL #4   Title Patient will demonstrate adequate sitting balance to except small weight shifts in any direction to improve safety with functional tasks such as toileting   Baseline not fully addressed   Time 4   Period Weeks   Status Deferred           OT Long Term Goals - 11/09/14 1616    OT LONG TERM GOAL #1  Title Patient will be independent with updated home exercise program - 11/27/2014   Time 8   Period Weeks   Status Achieved   OT LONG TERM GOAL #2   Title Patient will demonstarte effective ability to guide aide to complete PROM program for bilateral upper extremities   Time 8   Period Weeks   Status Achieved   OT LONG TERM GOAL #3   Title Patient will demonstrate effective use of hands to hold phone to ear (revised per pt request)   Time 8   Period Weeks   Status Achieved   OT LONG TERM GOAL #4   Title Patient will explore options to return to scrapbooking with moderate assistance from family or aide.     Status Achieved  per pt report               Plan - 11/09/14 1615    Clinical Impression Statement Pt has met all STG and LTG's and is now ready for d/c from OT services   Pt will benefit from skilled therapeutic intervention in order to improve on the following deficits (Retired) Decreased activity  tolerance;Decreased balance;Decreased cognition;Decreased knowledge of use of DME;Decreased endurance;Decreased coordination;Decreased range of motion;Decreased strength;Impaired flexibility;Impaired sensation;Impaired tone;Impaired UE functional use;Improper body mechanics;Obesity   Rehab Potential Good   OT Frequency 2x / week   OT Duration 8 weeks   OT Treatment/Interventions Self-care/ADL training;Electrical Stimulation;Moist Heat;Fluidtherapy;Therapeutic exercise;Neuromuscular education;Energy conservation;Functional Mobility Training;DME and/or AE instruction;Passive range of motion;Cognitive remediation/compensation;Therapeutic activities;Therapeutic exercises;Balance training;Patient/family education;Splinting   Plan d/c from [place] HEP issued   Consulted and Agree with Plan of Care Patient;Family member/caregiver   Family Member Consulted Tameka        Problem List Patient Active Problem List   Diagnosis Date Noted  . Multiple sclerosis 09/07/2014  . 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 Dysesthesia 09/07/2014  . Depression 09/07/2014   OCCUPATIONAL THERAPY DISCHARGE SUMMARY  Visits from Start of Care: 8  Current functional level related to goals / functional outcomes: See goals above   Remaining deficits: Weakness, increased tone, decreased use of UE's   Education / Equipment: HEP, splints Plan: Patient agrees to discharge.  Patient goals were met. Patient is being discharged due to meeting the stated rehab goals.  ?????     Quay Burow, OTR/L 11/09/2014, 4:17 PM  [facility] [address] [place], [place], [address] Phone: [phone]   Fax:  [phone]

## 2014-11-10 ENCOUNTER — Telehealth: Payer: Self-pay | Admitting: Neurology

## 2014-11-10 NOTE — Telephone Encounter (Signed)
Last OV note says: We will stop the gabapentin and have her start lamotrigine and titrate up to a dose of 100 mg by mouth twice a day It appears the 74m tabs were prescribed as a titration dose, and a separate Rx was written for 1066mtabs to begin after titration is complete.  I called the patient, she is aware.  I spoke with pharmacy, they do have Rx and will process it today, and notify patient when it is ready for pick up.

## 2014-11-10 NOTE — Telephone Encounter (Signed)
Patient is calling to get refill for Rx Lamotrigine 25 mg. Please call.

## 2014-11-11 NOTE — Telephone Encounter (Signed)
Paperwork Scientist, clinical (histocompatibility and immunogenetics) request for coverage) complete, on [facility] desk to be signed/fim

## 2014-11-11 NOTE — Telephone Encounter (Signed)
Paperwork signed and mailed back to [place], as she must sign paperwork as well.  I included last ov  note which needs to go back to [place] as well.  I spoke with Vanessa Short and advised her to expect paperwork, and to be sure to sign it prior to returning it.  She verbalized understanding of same/fi

## 2014-11-12 ENCOUNTER — Encounter: Payer: Medicare Other | Admitting: Occupational Therapy

## 2014-11-16 ENCOUNTER — Encounter: Payer: Medicare Other | Admitting: Occupational Therapy

## 2014-11-19 ENCOUNTER — Encounter: Payer: Medicare Other | Admitting: Occupational Therapy

## 2014-11-23 ENCOUNTER — Telehealth: Payer: Self-pay | Admitting: Neurology

## 2014-11-23 ENCOUNTER — Encounter: Payer: Medicare Other | Admitting: Occupational Therapy

## 2014-11-23 NOTE — Telephone Encounter (Signed)
Randon Goldsmith, RN with [facility] @ [phone] x [phone], stated patient Transitional Coverage request has been denied due to 90 days expired as of 11/08/14.  Please call with any questions

## 2014-11-23 NOTE — Telephone Encounter (Signed)
Noted.  I have made our office manager aware/fim

## 2014-11-26 ENCOUNTER — Ambulatory Visit: Payer: Managed Care, Other (non HMO) | Admitting: Occupational Therapy

## 2014-12-02 ENCOUNTER — Other Ambulatory Visit: Payer: Self-pay | Admitting: Neurology

## 2014-12-02 ENCOUNTER — Telehealth: Payer: Self-pay | Admitting: Neurology

## 2014-12-02 MED ORDER — AMITRIPTYLINE HCL 25 MG PO TABS: 25.0000 mg | ORAL_TABLET | Freq: Every day | ORAL | Status: DC

## 2014-12-02 NOTE — Therapy (Signed)
[facility] [address] [place], [place], [address] Phone: [phone]   Fax:  [phone]  Occupational Therapy Evaluation  Patient Details  Name: Vanessa Short MRN: [id number] Date of Birth: May 22, 1966 Referring Provider:  Britt Bottom, MD  Encounter Date: 10/02/2014    Past Medical History  Diagnosis Date  . Multiple sclerosis   . Ulcerative colitis   . Hypertension   . Headache   . Movement disorder   . Neuropathy   . Vision abnormalities     Past Surgical History  Procedure Laterality Date  . Kidney stone surgery      There were no vitals filed for this visit.  Visit Diagnosis:  Muscle spasticity - Plan: Ot plan of care cert/re-cert  Reduced active range of joint movement - Plan: Ot plan of care cert/re-cert  Postural instability - Plan: Ot plan of care cert/re-cert  Weakness of both upper extremities - Plan: Ot plan of care cert/re-cert                            OT Short Term Goals - 11/09/14 1616    OT SHORT TERM GOAL #1   Title Patient will be independent with home activity program - 10/30/2014   Baseline hstretching program issued and reviewed with caregiver 10/29/14   Time 4   Period Weeks   Status On-going   OT SHORT TERM GOAL #2   Title Patient will demonstrate improved active finger extension in right hand to allow her to shape around cup for drinking   Baseline for a small cup   Time 4   Period Weeks   Status Achieved   OT SHORT TERM GOAL #3   Title Patient will demonstrate adequate grasp and control to manipulate eating utensils (built up) to bring non - finger foods to mouth after set up assistance   Time 4   Period Weeks   Status Partially Met   OT SHORT TERM GOAL #4   Title Patient will demonstrate adequate sitting balance to except small weight shifts in any direction to improve safety with functional tasks such as toileting   Baseline not fully  addressed   Time 4   Period Weeks   Status Deferred           OT Long Term Goals - 11/09/14 1616    OT LONG TERM GOAL #1   Title Patient will be independent with updated home exercise program - 11/27/2014   Time 8   Period Weeks   Status Achieved   OT LONG TERM GOAL #2   Title Patient will demonstarte effective ability to guide aide to complete PROM program for bilateral upper extremities   Time 8   Period Weeks   Status Achieved   OT LONG TERM GOAL #3   Title Patient will demonstrate effective use of hands to hold phone to ear (revised per pt request)   Time 8   Period Weeks   Status Achieved   OT LONG TERM GOAL #4   Title Patient will explore options to return to scrapbooking with moderate assistance from family or aide.     Status Achieved  per pt report                 G-Codes - 12-27-14 1048    Functional Limitation Carrying, moving and handling objects   Carrying, Moving and Handling Objects Current Status (Z9935) At least 80 percent  but less than 100 percent impaired, limited or restricted   Carrying, Moving and Handling Objects Goal Status [phone]) At least 60 percent but less than 80 percent impaired, limited or restricted          Problem List Patient Active Problem List   Diagnosis Date Noted  . Multiple sclerosis 09/07/2014  . 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 Dysesthesia 09/07/2014  . Depression 09/07/2014    Mariah Milling 12/02/2014, 10:53 AM  [facility] [address] [place], [place], [address] Phone: [phone]   Fax:  [phone]

## 2014-12-02 NOTE — Telephone Encounter (Signed)
Patient called wanting to let Dr. Felecia Shelling know that she is having excessive itching everywhere on her body and thinks that is a reaction from not taking one of the scripts she was taking  While seeing Dr. Felecia Shelling at [facility]. Patient has not taking the script over 3 months and does not remember the name for it. She states that the medication was for the excessive itching. Please call and advice # [phone]

## 2014-12-02 NOTE — Therapy (Signed)
[facility] [address] [place], [place], [address] Phone: [phone]   Fax:  [phone]  Occupational Therapy Treatment  Patient Details  Name: Vanessa Short MRN: [id number] Date of Birth: 28-Aug-1965 Referring Provider:  Britt Bottom, MD  Encounter Date: 11/09/2014    Past Medical History  Diagnosis Date  . Multiple sclerosis   . Ulcerative colitis   . Hypertension   . Headache   . Movement disorder   . Neuropathy   . Vision abnormalities     Past Surgical History  Procedure Laterality Date  . Kidney stone surgery      There were no vitals filed for this visit.  Visit Diagnosis:  Muscle spasticity  Muscle stiffness  Weakness of both upper extremities                              OT Short Term Goals - 11/09/14 1616    OT SHORT TERM GOAL #1   Title Patient will be independent with home activity program - 10/30/2014   Baseline hstretching program issued and reviewed with caregiver 10/29/14   Time 4   Period Weeks   Status On-going   OT SHORT TERM GOAL #2   Title Patient will demonstrate improved active finger extension in right hand to allow her to shape around cup for drinking   Baseline for a small cup   Time 4   Period Weeks   Status Achieved   OT SHORT TERM GOAL #3   Title Patient will demonstrate adequate grasp and control to manipulate eating utensils (built up) to bring non - finger foods to mouth after set up assistance   Time 4   Period Weeks   Status Partially Met   OT SHORT TERM GOAL #4   Title Patient will demonstrate adequate sitting balance to except small weight shifts in any direction to improve safety with functional tasks such as toileting   Baseline not fully addressed   Time 4   Period Weeks   Status Deferred           OT Long Term Goals - 11/09/14 1616    OT LONG TERM GOAL #1   Title Patient will be independent with updated home  exercise program - 11/27/2014   Time 8   Period Weeks   Status Achieved   OT LONG TERM GOAL #2   Title Patient will demonstarte effective ability to guide aide to complete PROM program for bilateral upper extremities   Time 8   Period Weeks   Status Achieved   OT LONG TERM GOAL #3   Title Patient will demonstrate effective use of hands to hold phone to ear (revised per pt request)   Time 8   Period Weeks   Status Achieved   OT LONG TERM GOAL #4   Title Patient will explore options to return to scrapbooking with moderate assistance from family or aide.     Status Achieved  per pt report                 G-Codes - 2014-12-06 1236    Functional Assessment Tool Used skilled clinical observation - patient unable to complete testing   Functional Limitation Carrying, moving and handling objects   Carrying, Moving and Handling Objects Current Status (F7494) At least 80 percent but less than 100 percent impaired, limited or restricted   Carrying, Moving and Handling Objects Goal Status (  [id number]) At least 82 percent but less than 80 percent impaired, limited or restricted   Carrying, Moving and Handling Objects Discharge Status [phone]) At least 60 percent but less than 80 percent impaired, limited or restricted      Problem List Patient Active Problem List   Diagnosis Date Noted  . Multiple sclerosis 09/07/2014  . 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 Dysesthesia 09/07/2014  . Depression 09/07/2014    Mariah Milling 12/02/2014, 12:39 PM  [facility] [address] [place], [place], [address] Phone: [phone]   Fax:  [phone]

## 2014-12-02 NOTE — Telephone Encounter (Signed)
Spoke with Lucely who sts. Lamictal was initially helping the tingling in her arm--she is now titrated up to 172m bid and it is becoming less effective the longer she takes it.  She also c/o chronic gen. itching--sts. [facility] used to rx. Elavil for same, but she stopped it and itching returned.  Per [facility], ok to given Elavil 261mpo qhs (dose she was on before), and that Elavil may also help the tingling in her arm.  Jeanann verbalized understanding of same.  [facility] [place] [facility].  If Elavil doesn;t provide additional relief of tingling in a few weeks, she will call me back and per [facility] the next step would be to increase Lamictal to [id number]/fim

## 2015-01-06 ENCOUNTER — Ambulatory Visit (INDEPENDENT_AMBULATORY_CARE_PROVIDER_SITE_OTHER): Payer: Managed Care, Other (non HMO) | Admitting: Neurology

## 2015-01-06 ENCOUNTER — Encounter: Payer: Self-pay | Admitting: Neurology

## 2015-01-06 VITALS — BP 134/69 | HR 81 | Ht 63.0 in

## 2015-01-06 DIAGNOSIS — R208 Other disturbances of skin sensation: Secondary | ICD-10-CM

## 2015-01-06 DIAGNOSIS — G8389 Other specified paralytic syndromes: Secondary | ICD-10-CM

## 2015-01-06 DIAGNOSIS — G35 Multiple sclerosis: Secondary | ICD-10-CM | POA: Diagnosis not present

## 2015-01-06 DIAGNOSIS — F329 Major depressive disorder, single episode, unspecified: Secondary | ICD-10-CM

## 2015-01-06 DIAGNOSIS — F32A Depression, unspecified: Secondary | ICD-10-CM

## 2015-01-06 DIAGNOSIS — G8250 Quadriplegia, unspecified: Secondary | ICD-10-CM

## 2015-01-06 DIAGNOSIS — M6289 Other specified disorders of muscle: Secondary | ICD-10-CM

## 2015-01-06 DIAGNOSIS — R29898 Other symptoms and signs involving the musculoskeletal system: Secondary | ICD-10-CM | POA: Insufficient documentation

## 2015-01-06 NOTE — Progress Notes (Signed)
[facility]  [name] DOB: 1965-09-10  REFERRING CLINICIAN: Adah Salvage, [facility] HISTORY FROM: patient  REASON FOR VISIT: MS   HISTORICAL  CHIEF COMPLAINT:  Chief Complaint  Patient presents with  . Follow-up    MS, pt states numbness has increased    HISTORY OF PRESENT ILLNESS:  Mrs. Service is a 49 year old woman diagnosed with MS in 1989.   Since her last visit, she is noting more numbness in her fingers and toes.  She felt she did worse while in Occupational therapy.   She was placed in a brace and she feels the hands clench up more afterwards than before she started therapy.     Gait/Strength:   Her main issue is progressive leg > arm strength, worse on the left.   decrease gait.   Legs are also very spastic. She is on baclofen 10 mg by mouth 3 times a day with an additional half pill at bedtime. She also is on dantrolene.  Botox therapy for arm spasticity  did not significantly. It led to a few weeks off significantly less spasming in the hands and maybe a couple more weeks with some reduction of spasming. However, it did not increase function.   Sensation/pain:  She has painful dysesthesias in the right shoulder. The pain will increase if it is hit by hot water when she takes a shower. Gabapentin was not tolerated.    Lamotrigine 100 mg po bid has helped quite a bit and addition of elavil has helped a bit more.  She has had more numbness with a swollen sensation in the left thumb and right index fingers.   She cannot feel herself move the thumb as well as she used to.    Vision/Vertigo:   Vision is the same -- mild decreased acuity and occasional diplopia.   She notes spinning vertigo at times but this has not worsened since last visit.    Bladder/Bowel:  She has bladder dysfunction. When she is at home she usually is able to get to the bathroom to urinate. However, she wears Depends as she will sometimes have incontinence.  Out of the house, she has more incontinence and always uses Depends.  Fatigue/sleep:   She has a fair amount of fatigue but no worse than last visit.    She is sleeping a little better with lamotrigine/elavil helping the pain.    Mood/Cognition     She has some depression , worse with father's death and stress from estate issues.  No anxiety.   She has not had major problems with cognitive issues.  MS History:  She presented with a Lhermite's syndrome in 1988 or 1989.   MRIs of the head and neck were performed. They showed lesions consistent with the diagnosis of multiple sclerosis. Lumbar puncture was not necessary. Over the next 15 years, she would get some exacerbations and have courses of steroids. However, a disease modifying therapy was not prescribed. By 2004, she had difficulty with her gait and would often have to lean on somebody for support.  She started Copaxone that year and stayed on it for about one half years. She stopped due to a lot of itching. She then switched to Rebif. While on Copaxone Rebif she continues to have exacerbations and further difficulties with her gait. About 2012 she switched to [facility] and has been on that medicine since. She tolerates Aubagio well but is not sure how well it is working for her.  She was off Aubagio x 2-3 weeks in 2013 and felt she did worse.  Last MRI's about 3 years ago.    REVIEW OF SYSTEMS:  Constitutional: No fevers, chills, sweats, or change in appetite Eyes: see above.   No eye pain Ear, nose and throat: No hearing loss, ear pain, nasal congestion, sore throat Cardiovascular: No chest pain, palpitations Respiratory:  No shortness of breath at rest or with exertion.   No wheezes GastrointestinaI: No nausea, vomiting, diarrhea, abdominal pain, fecal incontinence Genitourinary:  No dysuria, urinary retention or frequency.  No nocturia. Musculoskeletal:  No neck pain, back pain Integumentary: No rash, pruritus, skin lesions Neurological: as  above Psychiatric: as above. Endocrine: No palpitations, diaphoresis, change in appetite, change in weigh or increased thirst Hematologic/Lymphatic:  No anemia, purpura, petechiae. Allergic/Immunologic: No itchy/runny eyes, nasal congestion, recent allergic reactions, rashes  ALLERGIES: Allergies  Allergen Reactions  . Penicillins Rash    HOME MEDICATIONS: Outpatient Prescriptions Prior to Visit  Medication Sig Dispense Refill  . amitriptyline (ELAVIL) 25 MG tablet Take 1 tablet (25 mg total) by mouth at bedtime. 30 tablet 11  . AUBAGIO 14 MG TABS TAKE 1 TABLET BY MOUTH ONCE DAILY 84 tablet 2  . baclofen (LIORESAL) 10 MG tablet One po up to 5 times a day 150 each 11  . cholecalciferol (VITAMIN D) 1000 UNITS tablet Take 1,000 Units by mouth daily.    . dantrolene (DANTRIUM) 50 MG capsule Take 50 mg by mouth 3 (three) times daily.    Marland Kitchen FLUoxetine (PROZAC) 40 MG capsule Take 1 capsule (40 mg total) by mouth daily. 90 capsule 3  . ibuprofen (ADVIL,MOTRIN) 200 MG tablet Take 400 mg by mouth every 6 (six) hours as needed for pain (pain).    Marland Kitchen labetalol (NORMODYNE) 100 MG tablet Take 100 mg by mouth daily.    Marland Kitchen lamoTRIgine (LAMICTAL) 100 MG tablet Take 1 tablet (100 mg total) by mouth daily. (Patient taking differently: Take 100 mg by mouth 2 (two) times daily. ) 60 tablet 11  . mesalamine (PENTASA) 500 MG CR capsule Take 1,000 mg by mouth 3 (three) times daily.    Marland Kitchen omeprazole (PRILOSEC) 40 MG capsule Take 40 mg by mouth 2 (two) times daily.    . solifenacin (VESICARE) 10 MG tablet Take 10 mg by mouth daily.    Marland Kitchen acyclovir (ZOVIRAX) 400 MG tablet   0  . b complex vitamins capsule Take 1 capsule by mouth daily.    Marland Kitchen lamoTRIgine (LAMICTAL) 25 MG tablet One a day po x 1 week then two a day po x 1 week, then 3 a day po x 1 week, then 4 a day x 1 week (Patient not taking: Reported on 10/02/2014) 70 tablet 0   No facility-administered medications prior to visit.    PAST MEDICAL HISTORY: Past  Medical History  Diagnosis Date  . Multiple sclerosis   . Ulcerative colitis   . Hypertension   . Headache   . Movement disorder   . Neuropathy   . Vision abnormalities     PAST SURGICAL HISTORY: Past Surgical History  Procedure Laterality Date  . Kidney stone surgery      FAMILY HISTORY: Family History  Problem Relation Age of Onset  . Dementia Mother   . Hypertension Father   . Hyperlipidemia Father   . Diabetes Father   . Heart disease Father     SOCIAL HISTORY:  History   Social History  . Marital Status: Married  Spouse Name: N/A  . Number of Children: N/A  . Years of Education: N/A   Occupational History  . Not on file.   Social History Main Topics  . Smoking status: Never Smoker   . Smokeless tobacco: Not on file  . Alcohol Use: No  . Drug Use: No  . Sexual Activity: Not on file   Other Topics Concern  . Not on file   Social History Narrative     PHYSICAL EXAM  Filed Vitals:   01/06/15 1444  BP: 134/69  Pulse: 81  Height: 5' 3"  (1.6 m)    There is no weight on file to calculate BMI.   General: The patient is well-developed and well-nourished and in no acute distress  Skin: Extremities are without significant edema.  Neurologic Exam  Mental status: The patient is alert and oriented x 3 at the time of the examination. The patient has apparent normal recent and remote memory, with an apparently normal attention span and concentration ability.   Speech is normal.  Cranial nerves: Extraocular movements are full. Pupils are equal, round, and reactive to light and accomodation.  Visual fields are full.  Facial symmetry is present. There is good facial sensation to soft touch bilaterally.Facial strength is normal.  Trapezius and sternocleidomastoid strength is normal. No dysarthria is noted.  The tongue is midline, and the patient has symmetric elevation of the soft palate. No obvious hearing deficits are noted.  Motor:  Muscle bulk is  normal .  Tone is increased. Strength is  0/5 in legs, in arms strength is 2--2/5 proximal and 3/5 distal in left arm and 3 to 4-/5 in right arm with extension > flexion weakness.    Sensory: Sensory testing is intact to pinprick, soft touch, vibration sensation sense on all 4 extremities.  Coordination: Cerebellar testing is poor bilateral due to weakness.  Gait and station: She can not stand or walk.    Reflexes: Deep tendon reflexes show spread at the knees though jerks are poor. Plantar responses are normal.    DIAGNOSTIC DATA (LABS, IMAGING, TESTING) - I reviewed patient records, labs, notes, testing and imaging myself where available.  Lab Results  Component Value Date   WBC 7.4 09/07/2014   HGB 10.5* 09/07/2014   HCT 32.8* 09/07/2014   MCV 89 09/07/2014   PLT 220 09/07/2014      Component Value Date/Time   NA 138 05/30/2013 2200   K 3.6 05/30/2013 2200   CL 104 05/30/2013 2200   GLUCOSE 117* 05/30/2013 2200   BUN 14 05/30/2013 2200   CREATININE 0.70 05/30/2013 2200   PROT 6.3 09/07/2014 1521   AST 18 09/07/2014 1521   ALT 19 09/07/2014 1521   ALKPHOS 76 09/07/2014 1521   BILITOT 0.4 09/07/2014 1521       ASSESSMENT AND PLAN  Hand weakness - Plan: MR Brain W Wo Contrast, MR Cervical Spine W Wo Contrast, NCV with EMG(electromyography)  Multiple sclerosis - Plan: MR Brain W Wo Contrast, MR Cervical Spine W Wo Contrast, NCV with EMG(electromyography)  Spastic quadriparesis - Plan: MR Brain W Wo Contrast, MR Cervical Spine W Wo Contrast, NCV with EMG(electromyography)  Dysesthesia - Plan: MR Brain W Wo Contrast, MR Cervical Spine W Wo Contrast, NCV with EMG(electromyography)  Depression  1.  Hand function is worsening.   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   MRI brain and cervical spine  to assess the degree of subclinical progression while on Aubagio and to make sure no treatable myelopathic  process. 3.   NCV (will not do EMG as she had bad experience in past) to determine if superimposed CTS on top of MS 4.   Continue med's 5.   She will return for follow-up in 4 months. However, if she has new or worsening neurologic symptoms she should call us sooner.  Richard A. Felecia Shelling, MD, PhD 4/50/3888, 2:80 PM Certified in Neurology, Clinical Neurophysiology, Sleep Medicine, Pain Medicine and Neuroimaging  [facility] [address], [place], [place] [address] [phone]

## 2015-01-24 ENCOUNTER — Ambulatory Visit
Admission: RE | Admit: 2015-01-24 | Discharge: 2015-01-24 | Disposition: A | Discharge status: Home / Self Care | Payer: Managed Care, Other (non HMO) | Source: Ambulatory Visit | Attending: Neurology | Admitting: Neurology

## 2015-01-24 DIAGNOSIS — G8250 Quadriplegia, unspecified: Secondary | ICD-10-CM

## 2015-01-24 DIAGNOSIS — R29898 Other symptoms and signs involving the musculoskeletal system: Secondary | ICD-10-CM

## 2015-01-24 DIAGNOSIS — G35D Multiple sclerosis, unspecified: Secondary | ICD-10-CM

## 2015-01-24 DIAGNOSIS — G8389 Other specified paralytic syndromes: Secondary | ICD-10-CM | POA: Diagnosis not present

## 2015-01-24 DIAGNOSIS — R208 Other disturbances of skin sensation: Secondary | ICD-10-CM | POA: Diagnosis not present

## 2015-01-24 DIAGNOSIS — G35 Multiple sclerosis: Secondary | ICD-10-CM | POA: Diagnosis not present

## 2015-01-24 DIAGNOSIS — G800 Spastic quadriplegic cerebral palsy: Secondary | ICD-10-CM

## 2015-01-24 MED ORDER — GADOBENATE DIMEGLUMINE 529 MG/ML IV SOLN: 15.0000 mL | Freq: Once | INTRAVENOUS | Status: AC | PRN

## 2015-01-26 ENCOUNTER — Telehealth: Payer: Self-pay | Admitting: *Deleted

## 2015-01-26 NOTE — Telephone Encounter (Signed)
-----   Message from Britt Bottom, MD sent at 01/26/2015  8:38 AM EDT ----- MRI's of the brain and cerv spine shown no brand new (enhancing) lesions but she does have multiple old plaques in spinal cord causing the bulk of her impairments.         --- she had MRI at [facility] a few years back   --- please let patient know results and see if we can  get copy of old MRI's (brain and spine)

## 2015-01-26 NOTE — Telephone Encounter (Signed)
I have ordered cd of mri brain/c-spine from [facility], and will call pt. later today with below results/fim

## 2015-01-27 NOTE — Telephone Encounter (Signed)
-----   Message from Britt Bottom, MD sent at 01/26/2015  8:38 AM EDT ----- MRI's of the brain and cerv spine shown no brand new (enhancing) lesions but she does have multiple old plaques in spinal cord causing the bulk of her impairments.         --- she had MRI at [facility] a few years back   --- please let patient know results and see if we can  get copy of old MRI's (brain and spine)

## 2015-01-27 NOTE — Telephone Encounter (Signed)
I have spoken with Vanessa Short this afternoon and per RAS, advised that mri brain/neck show no brand new lesions--just old lesions in neck.  I advised I have requested old mri brain/c-spine from [facility] for comparison.  She verbalized understanding of same, sts. numbness she was having in her right shoulder has progressed down right arm, also right trunk.  Sts. she is taking Lamictal 142m bid as rx'd..  I will check with RAS for other options and call her back/fim

## 2015-02-23 ENCOUNTER — Other Ambulatory Visit: Payer: Self-pay

## 2015-02-23 DIAGNOSIS — Z1231 Encounter for screening mammogram for malignant neoplasm of breast: Secondary | ICD-10-CM

## 2015-03-15 ENCOUNTER — Ambulatory Visit
Admission: RE | Admit: 2015-03-15 | Discharge: 2015-03-15 | Disposition: A | Discharge status: Home / Self Care | Payer: Managed Care, Other (non HMO) | Source: Ambulatory Visit

## 2015-03-15 DIAGNOSIS — Z1231 Encounter for screening mammogram for malignant neoplasm of breast: Secondary | ICD-10-CM

## 2015-03-16 ENCOUNTER — Other Ambulatory Visit: Payer: Self-pay | Admitting: Family Medicine

## 2015-03-16 DIAGNOSIS — R928 Other abnormal and inconclusive findings on diagnostic imaging of breast: Secondary | ICD-10-CM

## 2015-03-22 ENCOUNTER — Ambulatory Visit
Admission: RE | Admit: 2015-03-22 | Discharge: 2015-03-22 | Disposition: A | Discharge status: Home / Self Care | Payer: Managed Care, Other (non HMO) | Source: Ambulatory Visit | Attending: Family Medicine | Admitting: Family Medicine

## 2015-03-22 DIAGNOSIS — R928 Other abnormal and inconclusive findings on diagnostic imaging of breast: Secondary | ICD-10-CM

## 2015-04-27 ENCOUNTER — Other Ambulatory Visit: Payer: Self-pay | Admitting: Neurology

## 2015-06-08 ENCOUNTER — Ambulatory Visit: Payer: Medicare Other | Admitting: Neurology

## 2015-06-09 ENCOUNTER — Ambulatory Visit (INDEPENDENT_AMBULATORY_CARE_PROVIDER_SITE_OTHER): Payer: Managed Care, Other (non HMO) | Admitting: Neurology

## 2015-06-09 ENCOUNTER — Encounter: Payer: Self-pay | Admitting: Neurology

## 2015-06-09 VITALS — BP 146/82 | HR 70 | Resp 16 | Ht 63.0 in | Wt 175.0 lb

## 2015-06-09 DIAGNOSIS — G8250 Quadriplegia, unspecified: Secondary | ICD-10-CM

## 2015-06-09 DIAGNOSIS — F329 Major depressive disorder, single episode, unspecified: Secondary | ICD-10-CM

## 2015-06-09 DIAGNOSIS — G35 Multiple sclerosis: Secondary | ICD-10-CM | POA: Diagnosis not present

## 2015-06-09 DIAGNOSIS — F32A Depression, unspecified: Secondary | ICD-10-CM

## 2015-06-09 DIAGNOSIS — R208 Other disturbances of skin sensation: Secondary | ICD-10-CM | POA: Diagnosis not present

## 2015-06-09 MED ORDER — LAMOTRIGINE 150 MG PO TABS: 150.0000 mg | ORAL_TABLET | Freq: Every day | ORAL | Status: DC

## 2015-06-09 NOTE — Progress Notes (Signed)
[facility]  [name] DOB: May 07, 1966  REFERRING CLINICIAN: Adah Salvage, [facility] HISTORY FROM: patient  REASON FOR VISIT: MS   HISTORICAL  CHIEF COMPLAINT:  Chief Complaint  Patient presents with  . Multiple Sclerosis    Sts. she continues to tolerate Aubagio.  Sts. has more weakness in her left arm.  Sts. she did not have emg/ncv that was ordered at last ov, because of a bad experience with same testing in the past--when she lived in [place].  She bought a zero gravity recliner to sleep in and would like a rx. for this for insurance purposes./fim    HISTORY OF PRESENT ILLNESS:  Vanessa Short is a 49 year old woman diagnosed with MS in 1989.   She continues to have stress handling her parent's estate out of state in [place].   Her left hand is nealry useless and her right hand is getting worse.    Gait/Strength:   She cannot walk and spends the day in a wheelchair.  Her main issue is progressive leg > arm strength, worse on the left.     Legs are also very spastic. She is on baclofen 10 mg by mouth 3 times a day with an additional half pill at bedtime.  Adding dantrolene has helped a lot more than Botox did.  Botox did not improve functioning.    Sensation/pain:  She reports pain in her limbs and the right shoulder.  Her HHA has done stretching exercises and the shoulder is better.  The pain will increase if it is hit by hot water when she takes a shower. Gabapentin was not tolerated.    Lamotrigine 100 mg po bid with amitriptyline has helped.  She has had more numbness with a swollen sensation in the left thumb and right index fingers.   She cannot feel herself move the thumb as well as she used to.    Vision/Vertigo:   Vision is about the same and she has appt. with ophtho this week.  She has mild decreased acuity and occasional diplopia.   She notes spinning vertigo at times and this was real bad with colonoscopy prep.    Bladder/Bowel:  She has bladder dysfunction with some benefit form Vesicare.  . When she is at home she usually is able to get to the bathroom to urinate. However, she wears Depends as she will sometimes have incontinence. Out of the house, she has more incontinence and always uses Depends. She has Ulcerative colitis and needs colonoscopies periodically.     Fatigue/sleep:   She has a fair amount of fatigue but no worse than last visit.    She has nocturia at night so sleep is variable.     Mood/Cognition     She has some depression , worse with father's death and stress from estate issues.  No anxiety.   She has never had major problems with cognitive issues.  MS History:  She presented with a Lhermite's syndrome in 1988 or 1989.   MRIs of the head and neck were performed. They showed lesions consistent with the diagnosis of multiple sclerosis. Lumbar puncture was not necessary. Over the next 15 years, she would get some exacerbations and have courses of steroids. However, a disease modifying therapy was not prescribed. By 2004, she had difficulty with her gait and would often have to lean on somebody for support.  She started Copaxone that year and stayed on it for about one half years. She stopped due  to a lot of itching. She then switched to Rebif. While on Copaxone Rebif she continues to have exacerbations and further difficulties with her gait. About 2012 she switched to [facility] and has been on that medicine since. She tolerates Aubagio well but is not sure how well it is working for her.   She was off Aubagio x 2-3 weeks in 2013 and felt she did worse.  Last MRI's about 3 years ago.    REVIEW OF SYSTEMS:  Constitutional: No fevers, chills, sweats, or change in appetite.  Has fatigue and poor slep Eyes: see above.   No eye pain Ear, nose and throat: No hearing loss, ear pain, nasal congestion, sore throat Cardiovascular: No chest pain, palpitations Respiratory:  No shortness of breath at rest  or with exertion.   No wheezes GastrointestinaI: Has UC - doing well.   No nausea, vomiting, diarrhea, abdominal pain, fecal incontinence Genitourinary:  see above Musculoskeletal:  No neck pain, back pain Integumentary: No rash, pruritus, skin lesions Neurological: as above Psychiatric: as above. Endocrine: No palpitations, diaphoresis, change in appetite, change in weigh or increased thirst Hematologic/Lymphatic:  No anemia, purpura, petechiae. Allergic/Immunologic: No itchy/runny eyes, nasal congestion, recent allergic reactions, rashes  ALLERGIES: Allergies  Allergen Reactions  . Penicillins Rash    HOME MEDICATIONS: Outpatient Prescriptions Prior to Visit  Medication Sig Dispense Refill  . acyclovir (ZOVIRAX) 400 MG tablet   0  . amitriptyline (ELAVIL) 25 MG tablet Take 1 tablet (25 mg total) by mouth at bedtime. 30 tablet 11  . AUBAGIO 14 MG TABS TAKE 1 TABLET BY MOUTH ONCE DAILY 84 tablet 2  . b complex vitamins capsule Take 1 capsule by mouth daily.    . baclofen (LIORESAL) 10 MG tablet One po up to 5 times a day 150 each 11  . cholecalciferol (VITAMIN D) 1000 UNITS tablet Take 1,000 Units by mouth daily.    . dantrolene (DANTRIUM) 50 MG capsule TAKE 1 CAPSULE 4 TIMES DAILY 360 capsule 0  . FLUoxetine (PROZAC) 40 MG capsule Take 1 capsule (40 mg total) by mouth daily. 90 capsule 3  . labetalol (NORMODYNE) 100 MG tablet Take 100 mg by mouth daily.    Marland Kitchen lamoTRIgine (LAMICTAL) 100 MG tablet Take 1 tablet (100 mg total) by mouth daily. 60 tablet 11  . mesalamine (PENTASA) 500 MG CR capsule Take 1,000 mg by mouth 3 (three) times daily.    Marland Kitchen omeprazole (PRILOSEC) 40 MG capsule Take 40 mg by mouth 2 (two) times daily.    . solifenacin (VESICARE) 10 MG tablet Take 10 mg by mouth daily.    Marland Kitchen ibuprofen (ADVIL,MOTRIN) 200 MG tablet Take 400 mg by mouth every 6 (six) hours as needed for pain (pain).    Marland Kitchen lamoTRIgine (LAMICTAL) 25 MG tablet One a day po x 1 week then two a day po x 1  week, then 3 a day po x 1 week, then 4 a day x 1 week (Patient not taking: Reported on 10/02/2014) 70 tablet 0   No facility-administered medications prior to visit.    PAST MEDICAL HISTORY: Past Medical History  Diagnosis Date  . Multiple sclerosis ([place])   . Ulcerative colitis ([place])   . Hypertension   . Headache   . Movement disorder   . Neuropathy ([place])   . Vision abnormalities     PAST SURGICAL HISTORY: Past Surgical History  Procedure Laterality Date  . Kidney stone surgery      FAMILY HISTORY: Family History  Problem Relation Age of Onset  . Dementia Mother   . Hypertension Father   . Hyperlipidemia Father   . Diabetes Father   . Heart disease Father     SOCIAL HISTORY:  Social History   Social History  . Marital Status: Married    Spouse Name: N/A  . Number of Children: N/A  . Years of Education: N/A   Occupational History  . Not on file.   Social History Main Topics  . Smoking status: Never Smoker   . Smokeless tobacco: Not on file  . Alcohol Use: No  . Drug Use: No  . Sexual Activity: Not on file   Other Topics Concern  . Not on file   Social History Narrative     PHYSICAL EXAM  Filed Vitals:   06/09/15 1439  BP: 146/82  Pulse: 70  Resp: 16  Height: 5' 3"  (1.6 m)  Weight: 175 lb (79.379 kg)    Body mass index is 31.01 kg/(m^2).   General: The patient is well-developed and well-nourished and in no acute distress  Skin: Extremities are without significant edema.  Neurologic Exam  Mental status: The patient is alert and oriented x 3 at the time of the examination. The patient has apparent normal recent and remote memory, with an apparently normal attention span and concentration ability.   Speech is normal.  Cranial nerves: Extraocular movements are full.   Facial symmetry is present. There is good facial sensation to soft touch bilaterally.Facial strength is normal.  Trapezius and sternocleidomastoid strength is normal. No  dysarthria is noted.  The tongue is midline, and the patient has symmetric elevation of the soft palate. No obvious hearing deficits are noted.  Motor:  Muscle bulk is normal .  Tone is increased in left > right arms and legs. Strength is  0/5 in legs, in arms strength is 2-/5 proximal and 2+/5 distal in left arm and 3 to 4-/5 in right arm with extension > flexion weakness.    Sensory: Sensory testing is intact to pinprick, soft touchin all 4 extremities.   Decreased vib in legs  Coordination: Cerebellar testing is poor bilateral due to weakness.  Gait and station: She can not stand or walk.    Reflexes: Deep tendon reflexes show spread at the knees though jerks are poor.      DIAGNOSTIC DATA (LABS, IMAGING, TESTING) - I reviewed patient records, labs, notes, testing and imaging myself where available.  Lab Results  Component Value Date   WBC 7.4 09/07/2014   HGB 10.5* 09/07/2014   HCT 32.8* 09/07/2014   MCV 89 09/07/2014   PLT 220 09/07/2014      Component Value Date/Time   NA 138 05/30/2013 2200   K 3.6 05/30/2013 2200   CL 104 05/30/2013 2200   GLUCOSE 117* 05/30/2013 2200   BUN 14 05/30/2013 2200   CREATININE 0.70 05/30/2013 2200   PROT 6.3 09/07/2014 1521   ALBUMIN 3.9 09/07/2014 1521   AST 18 09/07/2014 1521   ALT 19 09/07/2014 1521   ALKPHOS 76 09/07/2014 1521   BILITOT 0.4 09/07/2014 1521       ASSESSMENT AND PLAN  Multiple sclerosis ([facility]) - Plan: DME Other see comment  Spastic quadriparesis ([place]) - Plan: DME Other see comment  Depression  Dysesthesia  1.  Increase lamotrigine 2.   Continue Aubagio. 3.   Continue other med's 5.   She will return for follow-up in 5 months. Call sooner if  new or worsening  neurologic symptoms she should call us sooner.  Trayveon Beckford A. Felecia Shelling, MD, PhD 09/98/3382, 5:05 PM Certified in Neurology, Clinical Neurophysiology, Sleep Medicine, Pain Medicine and Neuroimaging  [facility] [address], [place], [place] [address] [phone]

## 2015-07-29 ENCOUNTER — Other Ambulatory Visit: Payer: Self-pay | Admitting: Neurology

## 2015-08-23 ENCOUNTER — Other Ambulatory Visit: Payer: Self-pay | Admitting: Neurology

## 2015-08-30 ENCOUNTER — Other Ambulatory Visit: Payer: Self-pay

## 2015-09-02 ENCOUNTER — Other Ambulatory Visit: Payer: Self-pay

## 2015-09-03 ENCOUNTER — Other Ambulatory Visit: Payer: Self-pay | Admitting: *Deleted

## 2015-09-03 MED ORDER — LAMOTRIGINE 150 MG PO TABS
150.0000 mg | ORAL_TABLET | Freq: Every day | ORAL | Status: DC
Start: 2015-09-03 — End: 2015-12-27

## 2015-09-03 MED ORDER — AMITRIPTYLINE HCL 25 MG PO TABS: 25.0000 mg | ORAL_TABLET | Freq: Every day | ORAL | Status: DC

## 2015-09-03 NOTE — Telephone Encounter (Signed)
Pt. requested 90 day supplies of Lamictal and Amitriptyline.  Rx's escribed as requested/fim

## 2015-10-13 DIAGNOSIS — J029 Acute pharyngitis, unspecified: Secondary | ICD-10-CM | POA: Insufficient documentation

## 2015-10-13 DIAGNOSIS — H9209 Otalgia, unspecified ear: Secondary | ICD-10-CM | POA: Insufficient documentation

## 2015-11-09 ENCOUNTER — Encounter: Payer: Self-pay | Admitting: Neurology

## 2015-11-09 ENCOUNTER — Ambulatory Visit (INDEPENDENT_AMBULATORY_CARE_PROVIDER_SITE_OTHER): Payer: Managed Care, Other (non HMO) | Admitting: Neurology

## 2015-11-09 VITALS — BP 170/96 | HR 72 | Resp 16 | Ht 63.0 in | Wt 175.0 lb

## 2015-11-09 DIAGNOSIS — F32A Depression, unspecified: Secondary | ICD-10-CM

## 2015-11-09 DIAGNOSIS — G8250 Quadriplegia, unspecified: Secondary | ICD-10-CM | POA: Diagnosis not present

## 2015-11-09 DIAGNOSIS — G35 Multiple sclerosis: Secondary | ICD-10-CM | POA: Diagnosis not present

## 2015-11-09 DIAGNOSIS — R208 Other disturbances of skin sensation: Secondary | ICD-10-CM

## 2015-11-09 DIAGNOSIS — N399 Disorder of urinary system, unspecified: Secondary | ICD-10-CM

## 2015-11-09 DIAGNOSIS — R5383 Other fatigue: Secondary | ICD-10-CM | POA: Diagnosis not present

## 2015-11-09 DIAGNOSIS — F329 Major depressive disorder, single episode, unspecified: Secondary | ICD-10-CM | POA: Diagnosis not present

## 2015-11-09 MED ORDER — BACLOFEN 10 MG PO TABS: ORAL_TABLET | ORAL | Status: DC

## 2015-11-09 NOTE — Progress Notes (Signed)
[facility]  [name] DOB: 1965-12-09  REFERRING CLINICIAN: Adah Salvage, [facility] HISTORY FROM: patient  REASON FOR VISIT: MS   HISTORICAL  CHIEF COMPLAINT:  Chief Complaint  Patient presents with  . Multiple Sclerosis    Sts. she continues to tolerate Aubagio well.  Sts. if she stops it for a week she can see a decline.  She had some hair thinning and hair has grown back curly.  Sts. she has more difficulty with hands, grips.  More trouble feeding herself/fim    HISTORY OF PRESENT ILLNESS:  Vanessa Short is a 50 year old woman diagnosed with MS in 1989.   She notes no new MS exacerbation.    She continues to have stress handling her parent's estate out of state in [place] since other family not able to do.   Her left hand is very weak and her right hand is getting worse.    Gait/Strength:   She cannot walk and spends the day in a wheelchair.  Her main issue is progressive leg > arm strength, worse on the left.     Legs are also very spastic. She is on baclofen 10 mg by mouth 3-5 times a day.  Dantrolene has helped some at 50 mg po tid (better than Botox).  Botox did not improve functioning.    Sensation/pain:  She has dysesthetic pain in her limbs and the right shoulder.  The pain will increase if it is hit by hot water when she takes a shower. Gabapentin was not tolerated.    Lamotrigine 150 mg po bid with amitriptyline has helped.  Her HHA has done stretching exercises and the shoulder is better.   Vision/Vertigo:   Vision is about the same and she has appt. with ophtho this week.  She has mild decreased acuity and occasional diplopia. She got a new pair of glasses but vision was better with old glasses.     She notes vertigo intermittently.     Bladder/Bowel:  She has bladder dysfunction with some benefit form Vesicare.  . When she is at home she usually is able to get to the bathroom to urinate. Out of the house, she has more  incontinence and always uses Depends. She has Ulcerative colitis and needs colonoscopies periodically.   Prep's are difficult  Fatigue/sleep:   She has a fair amount of fatigue but no worse than last visit.    She has nocturia at night so sleep is variable.     Mood/Cognition     She has mild depression , worse with father's death and stress from estate issues.  She denies anxiety.   She has never had major problems with cognitive issues.   MS History:  She presented with a Lhermite's syndrome in 1988 or 1989.   MRIs of the head and neck were performed. They showed lesions consistent with the diagnosis of multiple sclerosis. Lumbar puncture was not necessary. Over the next 15 years, she would get some exacerbations and have courses of steroids. However, a disease modifying therapy was not prescribed. By 2004, she had difficulty with her gait and would often have to lean on somebody for support.  She started Copaxone that year and stayed on it for about one half years. She stopped due to a lot of itching. She then switched to Rebif. While on Copaxone Rebif she continues to have exacerbations and further difficulties with her gait. About 2012 she switched to [facility] and has been on that  medicine since. She tolerates Aubagio well but is not sure how well it is working for her.   She was off Aubagio x 2-3 weeks in 2013 and felt she did worse.  Last MRI's about 3 years ago.    REVIEW OF SYSTEMS:  Constitutional: No fevers, chills, sweats, or change in appetite.  Has fatigue and poor slep Eyes: see above.   No eye pain Ear, nose and throat: No hearing loss, ear pain, nasal congestion, sore throat Cardiovascular: No chest pain, palpitations Respiratory:  No shortness of breath at rest or with exertion.   No wheezes GastrointestinaI: Has UC - doing well.   No nausea, vomiting, diarrhea, abdominal pain, fecal incontinence Genitourinary:  see above Musculoskeletal:  No neck pain, back pain Integumentary: No  rash, pruritus, skin lesions Neurological: as above Psychiatric: as above. Endocrine: No palpitations, diaphoresis, change in appetite, change in weigh or increased thirst Hematologic/Lymphatic:  No anemia, purpura, petechiae. Allergic/Immunologic: No itchy/runny eyes, nasal congestion, recent allergic reactions, rashes  ALLERGIES: Allergies  Allergen Reactions  . Penicillins Rash    HOME MEDICATIONS: Outpatient Prescriptions Prior to Visit  Medication Sig Dispense Refill  . acyclovir (ZOVIRAX) 400 MG tablet   0  . amitriptyline (ELAVIL) 25 MG tablet Take 1 tablet (25 mg total) by mouth at bedtime. 90 tablet 3  . AUBAGIO 14 MG TABS TAKE 1 TABLET BY MOUTH EVERY DAY 84 tablet 1  . b complex vitamins capsule Take 1 capsule by mouth daily.    . baclofen (LIORESAL) 10 MG tablet One po up to 5 times a day 150 each 11  . cholecalciferol (VITAMIN D) 1000 UNITS tablet Take 1,000 Units by mouth daily.    . dantrolene (DANTRIUM) 50 MG capsule TAKE 1 CAPSULE 4 TIMES DAILY 360 capsule 0  . FLUoxetine (PROZAC) 40 MG capsule Take 1 capsule (40 mg total) by mouth daily. 90 capsule 3  . labetalol (NORMODYNE) 100 MG tablet Take 100 mg by mouth daily.    Marland Kitchen lamoTRIgine (LAMICTAL) 150 MG tablet Take 1 tablet (150 mg total) by mouth daily. 90 tablet 3  . mesalamine (PENTASA) 500 MG CR capsule Take 1,000 mg by mouth 3 (three) times daily.    Marland Kitchen omeprazole (PRILOSEC) 40 MG capsule Take 40 mg by mouth 2 (two) times daily.    . solifenacin (VESICARE) 10 MG tablet Take 10 mg by mouth daily.     No facility-administered medications prior to visit.    PAST MEDICAL HISTORY: Past Medical History  Diagnosis Date  . Multiple sclerosis ([place])   . Ulcerative colitis ([place])   . Hypertension   . Headache   . Movement disorder   . Neuropathy ([place])   . Vision abnormalities     PAST SURGICAL HISTORY: Past Surgical History  Procedure Laterality Date  . Kidney stone surgery      FAMILY HISTORY: Family History   Problem Relation Age of Onset  . Dementia Mother   . Hypertension Father   . Hyperlipidemia Father   . Diabetes Father   . Heart disease Father     SOCIAL HISTORY:  Social History   Social History  . Marital Status: Married    Spouse Name: N/A  . Number of Children: N/A  . Years of Education: N/A   Occupational History  . Not on file.   Social History Main Topics  . Smoking status: Never Smoker   . Smokeless tobacco: Not on file  . Alcohol Use: No  . Drug Use:  No  . Sexual Activity: Not on file   Other Topics Concern  . Not on file   Social History Narrative     PHYSICAL EXAM  Filed Vitals:   11/09/15 1304  BP: 170/96  Pulse: 72  Resp: 16  Height: 5' 3"  (1.6 m)  Weight: 175 lb (79.379 kg)    Body mass index is 31.01 kg/(m^2).   General: The patient is well-developed and well-nourished and in no acute distress  Skin: Extremities are without significant edema.  Neurologic Exam  Mental status: The patient is alert and oriented x 3 at the time of the examination. The patient has apparent normal recent and remote memory, with an apparently normal attention span and concentration ability.   Speech is normal.  Cranial nerves: Extraocular movements are full.   Facial symmetry is present. There is good facial sensation to soft touch bilaterally.Facial strength is normal.  Trapezius and sternocleidomastoid strength is normal. No dysarthria is noted.  The tongue is midline, and the patient has symmetric elevation of the soft palate. No obvious hearing deficits are noted.  Motor:  Muscle bulk is normal .  Tone is increased in left > right arms and legs. Strength is  0/5 in legs, in arms strength is 2-/5 proximal and 2+/5 distal in left arm and 3 to 4-/5 in right arm with extension > flexion weakness.    Sensory: Sensory testing is intact to pinprick, soft touchin all 4 extremities.   Decreased vib in legs  Coordination: Cerebellar testing is poor bilateral due to  weakness.  Gait and station: She can not stand or walk.    Reflexes: Deep tendon reflexes show spread at the knees though jerks are poor.      DIAGNOSTIC DATA (LABS, IMAGING, TESTING) - I reviewed patient records, labs, notes, testing and imaging myself where available.  Lab Results  Component Value Date   WBC 7.4 09/07/2014   HGB 10.5* 09/07/2014   HCT 32.8* 09/07/2014   MCV 89 09/07/2014   PLT 220 09/07/2014      Component Value Date/Time   NA 138 05/30/2013 2200   K 3.6 05/30/2013 2200   CL 104 05/30/2013 2200   GLUCOSE 117* 05/30/2013 2200   BUN 14 05/30/2013 2200   CREATININE 0.70 05/30/2013 2200   PROT 6.3 09/07/2014 1521   ALBUMIN 3.9 09/07/2014 1521   AST 18 09/07/2014 1521   ALT 19 09/07/2014 1521   ALKPHOS 76 09/07/2014 1521   BILITOT 0.4 09/07/2014 1521       ASSESSMENT AND PLAN  Multiple sclerosis (HCC)  Spastic quadriparesis (HCC)  Depression  Dysesthesia   1.  Increase lamotrigine.    Increase baclofen 2.   Continue Aubagio. 3.   Continue other med's 4.   Her current wheelchair is 51 years old and having some issues. I gave her a prescription for a new power wheelchair. She will try to find out where she should try to get a new wheelchair from. I did let her know that most the time we need to do a separate visit just for mobility needs. 5.   She will return for follow-up in 4-5 months. Call sooner if  new or worsening neurologic symptoms she should call us sooner.  45 minutes face-to-face evaluation with greater than one half of the time counseling and coordinating care about her MS and related symptoms.  Richard A. Felecia Shelling, MD, PhD 04/07/538, 7:67 PM Certified in Neurology, Clinical Neurophysiology, Sleep Medicine, Pain Medicine and Neuroimaging  [facility] [address] [place], [place] [address] [phone]

## 2015-11-10 ENCOUNTER — Telehealth: Payer: Self-pay | Admitting: *Deleted

## 2015-11-10 LAB — CBC WITH DIFFERENTIAL/PLATELET
BASOS ABS: 0.1 10*3/uL (ref 0.0–0.2)
Basos: 1 %
EOS (ABSOLUTE): 0.2 10*3/uL (ref 0.0–0.4)
EOS: 3 %
HEMOGLOBIN: 10.3 g/dL — AB (ref 11.1–15.9)
Hematocrit: 32.7 % — ABNORMAL LOW (ref 34.0–46.6)
IMMATURE GRANULOCYTES: 0 %
Immature Grans (Abs): 0 10*3/uL (ref 0.0–0.1)
LYMPHS ABS: 1.2 10*3/uL (ref 0.7–3.1)
LYMPHS: 15 %
MCH: 31.7 pg (ref 26.6–33.0)
MCHC: 31.5 g/dL (ref 31.5–35.7)
MCV: 101 fL — ABNORMAL HIGH (ref 79–97)
MONOCYTES: 11 %
Monocytes Absolute: 0.9 10*3/uL (ref 0.1–0.9)
NEUTROS PCT: 70 %
Neutrophils Absolute: 5.3 10*3/uL (ref 1.4–7.0)
Platelets: 213 10*3/uL (ref 150–379)
RBC: 3.25 x10E6/uL — AB (ref 3.77–5.28)
RDW: 15.6 % — ABNORMAL HIGH (ref 12.3–15.4)
WBC: 7.6 10*3/uL (ref 3.4–10.8)

## 2015-11-10 LAB — COMPREHENSIVE METABOLIC PANEL
ALT: 14 IU/L (ref 0–32)
AST: 16 IU/L (ref 0–40)
Albumin/Globulin Ratio: 1.7 (ref 1.2–2.2)
Albumin: 4 g/dL (ref 3.5–5.5)
Alkaline Phosphatase: 82 IU/L (ref 39–117)
BUN / CREAT RATIO: 20 (ref 9–23)
BUN: 16 mg/dL (ref 6–24)
Bilirubin Total: 0.4 mg/dL (ref 0.0–1.2)
CALCIUM: 8.8 mg/dL (ref 8.7–10.2)
CHLORIDE: 106 mmol/L (ref 96–106)
CO2: 18 mmol/L (ref 18–29)
Creatinine, Ser: 0.81 mg/dL (ref 0.57–1.00)
GFR, EST AFRICAN AMERICAN: 97 mL/min/{1.73_m2} (ref >59)
GFR, EST NON AFRICAN AMERICAN: 84 mL/min/{1.73_m2} (ref >59)
GLUCOSE: 110 mg/dL — AB (ref 65–99)
Globulin, Total: 2.4 g/dL (ref 1.5–4.5)
Potassium: 4 mmol/L (ref 3.5–5.2)
Sodium: 142 mmol/L (ref 134–144)
TOTAL PROTEIN: 6.4 g/dL (ref 6.0–8.5)

## 2015-11-10 NOTE — Telephone Encounter (Signed)
LMTC./fim 

## 2015-11-10 NOTE — Telephone Encounter (Signed)
-----   Message from Britt Bottom, MD sent at 11/10/2015  8:25 AM EDT ----- Labs show that she has a mild anemia. She should take folic acid and F81 OTC. At the next visit we will recheck to make sure that labs are stable or improving.    If she wants, we can forward to her PCP Music therapist)

## 2015-11-10 NOTE — Telephone Encounter (Signed)
-----   Message from Britt Bottom, MD sent at 11/10/2015  8:25 AM EDT ----- Labs show that she has a mild anemia. She should take folic acid and J81 OTC. At the next visit we will recheck to make sure that labs are stable or improving.    If she wants, we can forward to her PCP Music therapist)

## 2015-11-10 NOTE — Telephone Encounter (Signed)
Pt returned Faith's call. She can be reached at [phone]

## 2015-11-10 NOTE — Telephone Encounter (Signed)
I have spoken with Vanessa Short and per RAS, advised that labs show mild anemia; she should take otc folic acid and vit. b12.  She verbalized understanding of same/fim

## 2015-12-17 ENCOUNTER — Other Ambulatory Visit: Payer: Self-pay | Admitting: Neurology

## 2015-12-27 ENCOUNTER — Telehealth: Payer: Self-pay | Admitting: Neurology

## 2015-12-27 MED ORDER — LAMOTRIGINE 150 MG PO TABS: 150.0000 mg | ORAL_TABLET | Freq: Two times a day (BID) | ORAL | Status: DC

## 2015-12-27 NOTE — Telephone Encounter (Signed)
Patient requesting refill of lamoTRIgine (LAMICTAL) 150 MG tablet Pharmacy: [id number] [place], [place] - [address]  Pt not sure about the dosage and how frequent

## 2015-12-27 NOTE — Telephone Encounter (Signed)
LMOM (identified vm) that Lamotrigine 123m daily [facility] ([facility] supply with 3 additional r/f) in Jan.--she should have r/f available/fim

## 2015-12-27 NOTE — Telephone Encounter (Signed)
Pt said she is actually taking 2 pills of Lamictal , she misread the directions. She is completely out of medication

## 2015-12-27 NOTE — Addendum Note (Signed)
Addended by: France Ravens I on: 12/27/2015 05:02 PM   Modules accepted: Orders

## 2015-12-27 NOTE — Telephone Encounter (Signed)
Per RAS, if pt. is doing well on Lamcital 143m bid, ok to r/f at that dose.  Tiffany sts. she tolerates this dose well.  [place] [place] [facility] [place]

## 2016-01-06 ENCOUNTER — Telehealth: Payer: Self-pay | Admitting: Neurology

## 2016-01-06 DIAGNOSIS — G8250 Quadriplegia, unspecified: Secondary | ICD-10-CM

## 2016-01-06 DIAGNOSIS — G35 Multiple sclerosis: Secondary | ICD-10-CM

## 2016-01-06 NOTE — Telephone Encounter (Signed)
Pt called in requesting a wheelchair rx. She has specific issues she needs to discuss pertaining to the rx. Please call pt [phone]

## 2016-01-06 NOTE — Telephone Encounter (Signed)
I have spoken with Vanessa Short this afternoon.  She requests order for PT/OT eval and tx. be faxed to [facility] and [facility].  I have done this as requested/fim

## 2016-03-14 ENCOUNTER — Encounter: Payer: Self-pay | Admitting: Neurology

## 2016-03-14 ENCOUNTER — Ambulatory Visit (INDEPENDENT_AMBULATORY_CARE_PROVIDER_SITE_OTHER): Payer: Managed Care, Other (non HMO) | Admitting: Neurology

## 2016-03-14 VITALS — BP 138/80 | HR 80 | Resp 16

## 2016-03-14 DIAGNOSIS — G35 Multiple sclerosis: Secondary | ICD-10-CM

## 2016-03-14 DIAGNOSIS — R5383 Other fatigue: Secondary | ICD-10-CM

## 2016-03-14 DIAGNOSIS — G8250 Quadriplegia, unspecified: Secondary | ICD-10-CM | POA: Diagnosis not present

## 2016-03-14 DIAGNOSIS — R208 Other disturbances of skin sensation: Secondary | ICD-10-CM

## 2016-03-14 DIAGNOSIS — R441 Visual hallucinations: Secondary | ICD-10-CM | POA: Insufficient documentation

## 2016-03-14 MED ORDER — TERIFLUNOMIDE 14 MG PO TABS: 1.0000 | ORAL_TABLET | Freq: Every day | ORAL | 4 refills | Status: DC

## 2016-03-14 NOTE — Progress Notes (Signed)
[facility]  [name] DOB: February 08, 1966  REFERRING CLINICIAN: Adah Salvage, [facility] HISTORY FROM: patient  REASON FOR VISIT: MS   HISTORICAL  CHIEF COMPLAINT:  Chief Complaint  Patient presents with  . Multiple Sclerosis    Rm 13. F/U. Patient needs refill of Aubagio. She also asks if we can recommend a cognitive counselor.      HISTORY OF PRESENT ILLNESS:  Vanessa Short is a 50 year old woman diagnosed with MS in 1989.   She notes no new MS exacerbation.    She has a lot of stress handling her parent's estate and selling their property (out of state in [place]) since other family not able to do.       Gait/Strength:  She is noting more weakness in her arms, worse on her left.  As the right arm gets weaker, she is needing to rely more on others.  Due to numbness, she is unsure how good is grip is on objects and she is dropping items more.     She cannot walk and spends the day in a wheelchair.     Legs are also very spastic. She is on baclofen 10 mg by mouth 3-5 times a day.  Dantrolene has helped some at 50 mg po tid (better than Botox).  Botox did not improve functioning.     She gets severe spasming at times, especially when lifted up.    The spasms, however, do help her transfer.  Sensation/pain:  She has dysesthetic pain in her limbs  --- sometimes severe tingling like a limb 'reawakening'..  The pain will increase if it is hit by hot water when she takes a shower. Gabapentin was not tolerated.    Lamotrigine 150 mg po bid with amitriptyline has helped.  Her HHA has done stretching exercises and the shoulder is better.   Vision/Vertigo:   Vision is about the same.  She has mild decreased acuity and occasional diplopia.    She notes vertigo intermittently.     Bladder/Bowel:  She has bladder dysfunction with some benefit form Vesicare.  . When she is at home she usually is able to get to the bathroom to urinate. Out of the house,  she has more incontinence and always uses Depends. She has Ulcerative colitis and needs colonoscopies periodically.   Prep's are difficult  Fatigue/sleep:   She has a fair amount of fatigue but no worse than last visit.   She has some insomnia at times.   Rarely, she will have a hallucination at bedtime.    She has nocturia at night and sleep is variable.     Mood/Cognition     She has mild depression , worse with father's death and stress from estate issues.  She denies anxiety.   She has never had major problems with cognitive issues.   MS History:  She presented with a Lhermite's syndrome in 1988 or 1989.   MRIs of the head and neck were performed. They showed lesions consistent with the diagnosis of multiple sclerosis. Lumbar puncture was not necessary. Over the next 15 years, she would get some exacerbations and have courses of steroids. However, a disease modifying therapy was not prescribed. By 2004, she had difficulty with her gait and would often have to lean on somebody for support.  She started Copaxone that year and stayed on it for about one half years. She stopped due to a lot of itching. She then switched to Rebif. While on  Copaxone Rebif she continues to have exacerbations and further difficulties with her gait. About 2012 she switched to [facility] and has been on that medicine since. She tolerates Aubagio well but is not sure how well it is working for her.   She was off Aubagio x 2-3 weeks in 2013 and felt she did worse.  Last MRI's about 3 years ago.    REVIEW OF SYSTEMS:  Constitutional: No fevers, chills, sweats, or change in appetite.  Has fatigue and poor slep Eyes: see above.   No eye pain Ear, nose and throat: No hearing loss, ear pain, nasal congestion, sore throat Cardiovascular: No chest pain, palpitations Respiratory:  No shortness of breath at rest or with exertion.   No wheezes GastrointestinaI: Has UC - doing well.   No nausea, vomiting, diarrhea, abdominal pain, fecal  incontinence Genitourinary:  see above Musculoskeletal:  No neck pain, back pain Integumentary: No rash, pruritus, skin lesions Neurological: as above Psychiatric: as above. Endocrine: No palpitations, diaphoresis, change in appetite, change in weigh or increased thirst Hematologic/Lymphatic:  No anemia, purpura, petechiae. Allergic/Immunologic: No itchy/runny eyes, nasal congestion, recent allergic reactions, rashes  ALLERGIES: Allergies  Allergen Reactions  . Penicillins Rash    HOME MEDICATIONS: Outpatient Medications Prior to Visit  Medication Sig Dispense Refill  . acyclovir (ZOVIRAX) 400 MG tablet   0  . amitriptyline (ELAVIL) 25 MG tablet Take 1 tablet (25 mg total) by mouth at bedtime. 90 tablet 3  . AUBAGIO 14 MG TABS TAKE 1 TABLET BY MOUTH EVERY DAY 84 tablet 1  . b complex vitamins capsule Take 1 capsule by mouth daily.    . baclofen (LIORESAL) 10 MG tablet One po up to 4 times a day 360 each 11  . cholecalciferol (VITAMIN D) 1000 UNITS tablet Take 1,000 Units by mouth daily.    . dantrolene (DANTRIUM) 50 MG capsule TAKE 1 CAPSULE 4 TIMES DAILY 360 capsule 3  . FLUoxetine (PROZAC) 40 MG capsule Take 1 capsule (40 mg total) by mouth daily. 90 capsule 3  . labetalol (NORMODYNE) 100 MG tablet Take 100 mg by mouth daily.    Marland Kitchen lamoTRIgine (LAMICTAL) 150 MG tablet Take 1 tablet (150 mg total) by mouth 2 (two) times daily. 180 tablet 3  . mesalamine (PENTASA) 500 MG CR capsule Take 1,000 mg by mouth 3 (three) times daily.    Marland Kitchen omeprazole (PRILOSEC) 40 MG capsule Take 40 mg by mouth 2 (two) times daily.    . solifenacin (VESICARE) 10 MG tablet Take 10 mg by mouth daily.     No facility-administered medications prior to visit.     PAST MEDICAL HISTORY: Past Medical History:  Diagnosis Date  . Headache   . Hypertension   . Movement disorder   . Multiple sclerosis ([place])   . Neuropathy ([place])   . Ulcerative colitis ([place])   . Vision abnormalities     PAST SURGICAL  HISTORY: Past Surgical History:  Procedure Laterality Date  . KIDNEY STONE SURGERY      FAMILY HISTORY: Family History  Problem Relation Age of Onset  . Dementia Mother   . Hypertension Father   . Hyperlipidemia Father   . Diabetes Father   . Heart disease Father     SOCIAL HISTORY:  Social History   Social History  . Marital status: Married    Spouse name: N/A  . Number of children: N/A  . Years of education: N/A   Occupational History  . Not on file.  Social History Main Topics  . Smoking status: Never Smoker  . Smokeless tobacco: Not on file  . Alcohol use No  . Drug use: No  . Sexual activity: Not on file   Other Topics Concern  . Not on file   Social History Narrative  . No narrative on file     PHYSICAL EXAM  Vitals:   03/14/16 1355  BP: 138/80  Pulse: 80  Resp: 16    There is no height or weight on file to calculate BMI.   General: The patient is well-developed and well-nourished and in no acute distress  Skin: Extremities are without significant edema.  Neurologic Exam  Mental status: The patient is alert and oriented x 3 at the time of the examination. The patient has apparent normal recent and remote memory, with an apparently normal attention span and concentration ability.   Speech is normal.  Cranial nerves: Extraocular movements are full.   Facial symmetry is present. There is good facial sensation to soft touch bilaterally.Facial strength is normal.  Trapezius and sternocleidomastoid strength is normal. No dysarthria is noted.  The tongue is midline, and the patient has symmetric elevation of the soft palate. No obvious hearing deficits are noted.  Motor:  Muscle bulk is normal .  Tone is increased in left > right arms and legs. Strength is  0/5 in legs, in arms strength is 2-/5 proximal and 2+/5 distal in left arm and 3 to 4-/5 in right arm with extension > flexion weakness.    Sensory: Sensory testing is intact to pinprick, soft  touchin all 4 extremities.   Decreased vib in legs  Coordination: Cerebellar testing is poor bilateral due to weakness.  Gait and station: She can not stand or walk.    Reflexes: Deep tendon reflexes show spread at the knees though jerks are poor.      DIAGNOSTIC DATA (LABS, IMAGING, TESTING) - I reviewed patient records, labs, notes, testing and imaging myself where available.  Lab Results  Component Value Date   WBC 7.6 11/09/2015   HGB 10.5 (L) 09/07/2014   HCT 32.7 (L) 11/09/2015   MCV 101 (H) 11/09/2015   PLT 213 11/09/2015      Component Value Date/Time   NA 142 11/09/2015 1351   K 4.0 11/09/2015 1351   CL 106 11/09/2015 1351   CO2 18 11/09/2015 1351   GLUCOSE 110 (H) 11/09/2015 1351   GLUCOSE 117 (H) 05/30/2013 2200   BUN 16 11/09/2015 1351   CREATININE 0.81 11/09/2015 1351   CALCIUM 8.8 11/09/2015 1351   PROT 6.4 11/09/2015 1351   ALBUMIN 4.0 11/09/2015 1351   AST 16 11/09/2015 1351   ALT 14 11/09/2015 1351   ALKPHOS 82 11/09/2015 1351   BILITOT 0.4 11/09/2015 1351   GFRNONAA 84 11/09/2015 1351   GFRAA 97 11/09/2015 1351       ASSESSMENT AND PLAN  Multiple sclerosis (HCC) - Plan: Ambulatory referral to Neuropsychology, DME Wheelchair electric, CANCELED: For home use only DME Wheelchair electric  Spastic quadriparesis ([place]) - Plan: DME Wheelchair electric, CANCELED: For home use only DME Wheelchair electric  Dysesthesia  Other fatigue  Visual hallucination - Plan: Ambulatory referral to Neuropsychology    1.   Continue higher dose of lamotrigine and baclofen 2.   Renew Aubagio. 3.   Continue other med's 4.    Due to visual hallucinations and MS and FH of cognitive disroders will get neuropsych evaluation 5.   She will return for follow-up  in 5 months. Call sooner if  new or worsening neurologic symptoms she should call us sooner.  45 minutes face-to-face evaluation with greater than one half of the time counseling and coordinating care about her  MS and related symptoms.  Richard A. Felecia Shelling, MD, PhD 0/08/7207, 1:06 PM Certified in Neurology, Clinical Neurophysiology, Sleep Medicine, Pain Medicine and Neuroimaging  [facility] [address], [place], [place] [address] [phone]

## 2016-04-05 ENCOUNTER — Telehealth: Payer: Self-pay | Admitting: Neurology

## 2016-04-05 DIAGNOSIS — G35 Multiple sclerosis: Secondary | ICD-10-CM

## 2016-04-05 DIAGNOSIS — G8250 Quadriplegia, unspecified: Secondary | ICD-10-CM

## 2016-04-05 NOTE — Telephone Encounter (Signed)
Patient called was advised by [facility] and [facility] that she needs Occupational Therapy and Physical Therapy to evaluate for power wheelchair.

## 2016-04-05 NOTE — Telephone Encounter (Signed)
I have spoken with Cartina this afternoon.  She sts. she is having difficulty getting eval for a new power w/c from [employer] and [facility].  Every time she calls them, they need something else and do not provide updates.  She would like referral to [place].  Referral for PT/OT eval for power w/c faxed to [facility]/fim

## 2016-04-18 NOTE — Telephone Encounter (Signed)
Sent order to Vanessa Short with [facility] to let her know the way order dropped under Other Orders . I relayed to Patient I sent as high priority and if she has not hurd from [place] buy this Friday she will call back on Friday. Patient understood all details and she was fine fine with this. Thanks Vanessa Short.

## 2016-04-18 NOTE — Telephone Encounter (Signed)
Patient called requesting to leave message for nurse Faith regarding next step for wheelchair. Please call [phone].

## 2016-04-18 NOTE — Telephone Encounter (Signed)
I have spoken with Kanijah.  She sts. she has not heard anything regarding PT/OT referral for power w/c, that was ordered on 8-23.  Will see if Hinton Dyer C. can check on this/fim

## 2016-04-18 NOTE — Telephone Encounter (Signed)
Noted/fim 

## 2016-04-19 NOTE — Telephone Encounter (Signed)
Kendal Hymen From [facility] is processing order for wheel chair . Thanks Hinton Dyer

## 2016-04-19 NOTE — Telephone Encounter (Signed)
Noted/fim 

## 2016-04-19 NOTE — Telephone Encounter (Signed)
Vanessa Short from [facility]

## 2016-04-26 ENCOUNTER — Telehealth: Payer: Self-pay | Admitting: Neurology

## 2016-04-26 MED ORDER — LAMOTRIGINE 200 MG PO TABS: 200.0000 mg | ORAL_TABLET | Freq: Every day | ORAL | 11 refills | Status: DC

## 2016-04-26 NOTE — Telephone Encounter (Signed)
Patient called requesting to speak to Faith regarding ongoing tingling feeling, states tingling has now moved to front, down to breasts.

## 2016-04-26 NOTE — Telephone Encounter (Signed)
I have spoken with Chanay this afternoon.  she is on Lamotrigine 117m po bid for dysesthetic pain right shoulder, breast, ribs.  Sts. she is having an exacerbation of this same pain.  Per RAS, ok to increase Lamotrigine to 2037mpo bid.  Makayleigh is agreeable.  [facility] [place] [facility]fim

## 2016-04-27 ENCOUNTER — Other Ambulatory Visit: Payer: Self-pay | Admitting: Family Medicine

## 2016-04-27 DIAGNOSIS — Z1231 Encounter for screening mammogram for malignant neoplasm of breast: Secondary | ICD-10-CM

## 2016-05-03 ENCOUNTER — Ambulatory Visit: Payer: Managed Care, Other (non HMO)

## 2016-05-08 NOTE — Telephone Encounter (Signed)
Pt called to advise medication is not helping with pain at all. Please call

## 2016-05-08 NOTE — Telephone Encounter (Signed)
I have spoken with Henrine and given appt. with RAS tomorrow/fim

## 2016-05-09 ENCOUNTER — Encounter: Payer: Self-pay | Admitting: Neurology

## 2016-05-09 ENCOUNTER — Ambulatory Visit (INDEPENDENT_AMBULATORY_CARE_PROVIDER_SITE_OTHER): Payer: Managed Care, Other (non HMO) | Admitting: Neurology

## 2016-05-09 VITALS — BP 134/86 | HR 76 | Resp 16 | Ht 63.0 in | Wt 175.0 lb

## 2016-05-09 DIAGNOSIS — R441 Visual hallucinations: Secondary | ICD-10-CM | POA: Diagnosis not present

## 2016-05-09 DIAGNOSIS — M7550 Bursitis of unspecified shoulder: Secondary | ICD-10-CM | POA: Insufficient documentation

## 2016-05-09 DIAGNOSIS — M7551 Bursitis of right shoulder: Secondary | ICD-10-CM

## 2016-05-09 DIAGNOSIS — M6289 Other specified disorders of muscle: Secondary | ICD-10-CM

## 2016-05-09 DIAGNOSIS — G35 Multiple sclerosis: Secondary | ICD-10-CM | POA: Diagnosis not present

## 2016-05-09 DIAGNOSIS — N399 Disorder of urinary system, unspecified: Secondary | ICD-10-CM | POA: Diagnosis not present

## 2016-05-09 DIAGNOSIS — R208 Other disturbances of skin sensation: Secondary | ICD-10-CM | POA: Diagnosis not present

## 2016-05-09 DIAGNOSIS — G8250 Quadriplegia, unspecified: Secondary | ICD-10-CM

## 2016-05-09 DIAGNOSIS — R29898 Other symptoms and signs involving the musculoskeletal system: Secondary | ICD-10-CM

## 2016-05-09 MED ORDER — LAMOTRIGINE 200 MG PO TABS: 200.0000 mg | ORAL_TABLET | Freq: Three times a day (TID) | ORAL | 3 refills | Status: DC

## 2016-05-09 NOTE — Progress Notes (Signed)
[facility]  [name] DOB: [birth year]  REFERRING CLINICIAN: Adah Salvage, [facility] HISTORY FROM: patient  REASON FOR VISIT: MS   HISTORICAL  CHIEF COMPLAINT:  Chief Complaint  Patient presents with  . Multiple Sclerosis    Sts. pain in right anterior shoulder is worse.  Radiating down and into right breast.  She is currently taking lamcital 258m bid.  Sts. she continues to tolerate Aubagio well and denies missed doses/fim  . Dysesthesias    HISTORY OF PRESENT ILLNESS:  Vanessa Short diagnosed with MS in 1989.   She notes no new MS exacerbation.         Gait/Strength:  She is noting no change in the left > right arm weakness. Or severe leg weakness. She notes altered sensation in her arms.     She cannot walk and spends her day in a wheelchair.     Legs are also very spastic and this seems to be doing worse. She is on baclofen 10 mg by mouth 3-5 times a day.  Dantrolene has helped some at 50 mg po tid (better than Botox).  Botox did not improve functioning.     She gets severe spasming at times, especially when lifted up.    The spasms, however, do help her transfer.  Sensation/pain:  She has pain that is worse in the right anterior shoulder region that radiates to the breast region.   Pain increases with stretches or other activity.   Pain feels like a very uncomfortable vibration.  Gabapentin was not tolerated.    Lamotrigine 150 mg po bid with amitriptyline helped with dysesthesia at first but less now.   We just went up on the lamotrigine.    Vision:   Vision is about the same.  She has mild decreased acuity and occasional diplopia.        Bladder/Bowel:  She has bladder dysfunction with some benefit form Vesicare.  . When she is at home she usually is able to get to the bathroom to urinate. Out of the house, she has more incontinence and always uses Depends. She has Ulcerative colitis and needs  colonoscopies periodically.   Prep's are difficult.   She is on Linzess due to many of her med's making constipation worse.  Fatigue/sleep:   She has a fair amount of fatigue but no worse than last visit.   She has some insomnia at times.     She has nocturia at night and sleep is variable.    Hallucinations:  She notes early morning visual hallucinations at times.   She sometimes sees her husband even when he is out of town.    Mood/Cognition     She has mild depression , worse with father's death and stress from estate issues.  She denies anxiety.   She has never had major problems with cognitive issues.  MS History:  She presented with a Lhermite's syndrome in 1988 or 1989.   MRIs of the head and neck were performed. They showed lesions consistent with the diagnosis of multiple sclerosis. Lumbar puncture was not necessary. Over the next 15 years, she would get some exacerbations and have courses of steroids. However, a disease modifying therapy was not prescribed. By 2004, she had difficulty with her gait and would often have to lean on somebody for support.  She started Copaxone that year and stayed on it for about one half years. She stopped due to a lot  of itching. She then switched to Rebif. While on Copaxone Rebif she continues to have exacerbations and further difficulties with her gait. About 2012 she switched to [facility] and has been on that medicine since. She tolerates Aubagio well but is not sure how well it is working for her.   She was off Aubagio x 2-3 weeks in 2013 and felt she did worse.  Last MRI's about 3 years ago.    REVIEW OF SYSTEMS:  Constitutional: No fevers, chills, sweats, or change in appetite.  Has fatigue and poor slep Eyes: see above.   No eye pain Ear, nose and throat: No hearing loss, ear pain, nasal congestion, sore throat Cardiovascular: No chest pain, palpitations Respiratory:  No shortness of breath at rest or with exertion.   No wheezes GastrointestinaI: Has UC  - doing well.   No nausea, vomiting, diarrhea, abdominal pain, fecal incontinence Genitourinary:  see above Musculoskeletal:  No neck pain, back pain Integumentary: No rash, pruritus, skin lesions Neurological: as above Psychiatric: as above. Endocrine: No palpitations, diaphoresis, change in appetite, change in weigh or increased thirst Hematologic/Lymphatic:  No anemia, purpura, petechiae. Allergic/Immunologic: No itchy/runny eyes, nasal congestion, recent allergic reactions, rashes  ALLERGIES: Allergies  Allergen Reactions  . Penicillins Rash    HOME MEDICATIONS: Outpatient Medications Prior to Visit  Medication Sig Dispense Refill  . acyclovir (ZOVIRAX) 400 MG tablet   0  . amitriptyline (ELAVIL) 25 MG tablet Take 1 tablet (25 mg total) by mouth at bedtime. 90 tablet 3  . b complex vitamins capsule Take 1 capsule by mouth daily.    Marland Kitchen b complex vitamins tablet Take by mouth.    . baclofen (LIORESAL) 10 MG tablet One po up to 4 times a day 360 each 11  . cholecalciferol (VITAMIN D) 1000 UNITS tablet Take 1,000 Units by mouth daily.    . dantrolene (DANTRIUM) 50 MG capsule TAKE 1 CAPSULE 4 TIMES DAILY 360 capsule 3  . FLUoxetine (PROZAC) 40 MG capsule Take 1 capsule (40 mg total) by mouth daily. 90 capsule 3  . FOLIC ACID PO Take by mouth.    . labetalol (NORMODYNE) 100 MG tablet Take 100 mg by mouth daily.    Marland Kitchen LINZESS 72 MCG capsule     . mesalamine (PENTASA) 500 MG CR capsule Take 1,000 mg by mouth 3 (three) times daily.    Marland Kitchen omeprazole (PRILOSEC) 40 MG capsule Take 40 mg by mouth 2 (two) times daily.    . solifenacin (VESICARE) 10 MG tablet Take 10 mg by mouth daily.    . Teriflunomide (AUBAGIO) 14 MG TABS Take 1 tablet by mouth daily. 84 tablet 4  . lamoTRIgine (LAMICTAL) 200 MG tablet Take 1 tablet (200 mg total) by mouth daily. 60 tablet 11   No facility-administered medications prior to visit.     PAST MEDICAL HISTORY: Past Medical History:  Diagnosis Date  .  Headache   . Hypertension   . Movement disorder   . Multiple sclerosis ([place])   . Neuropathy ([place])   . Ulcerative colitis ([place])   . Vision abnormalities     PAST SURGICAL HISTORY: Past Surgical History:  Procedure Laterality Date  . KIDNEY STONE SURGERY      FAMILY HISTORY: Family History  Problem Relation Age of Onset  . Dementia Mother   . Hypertension Father   . Hyperlipidemia Father   . Diabetes Father   . Heart disease Father     SOCIAL HISTORY:  Social History  Social History  . Marital status: Married    Spouse name: N/A  . Number of children: N/A  . Years of education: N/A   Occupational History  . Not on file.   Social History Main Topics  . Smoking status: Never Smoker  . Smokeless tobacco: Not on file  . Alcohol use No  . Drug use: No  . Sexual activity: Not on file   Other Topics Concern  . Not on file   Social History Narrative  . No narrative on file     PHYSICAL EXAM  Vitals:   05/09/16 1455  BP: 134/86  Pulse: 76  Resp: 16  Weight: 175 lb (79.4 kg)  Height: 5' 3"  (1.6 m)    Body mass index is 31 kg/m.   General: The patient is well-developed and well-nourished and in no acute distress  Skin: Extremities are without rash.    Her right shoulder is moderately tender over subacromial bursa.     Neurologic Exam  Mental status: The patient is alert and oriented x 3 at the time of the examination. The patient has apparent normal recent and remote memory, with an apparently normal attention span and concentration ability.   Speech is normal.  Cranial nerves: Extraocular movements are full.   Facial symmetry is present. There is good facial sensation to soft touch bilaterally.Facial strength is normal.  Trapezius and sternocleidomastoid strength is normal. No dysarthria is noted.  The tongue is midline, and the patient has symmetric elevation of the soft palate. No obvious hearing deficits are noted.  Motor:  Muscle bulk is normal .   Tone is increased in left > right arms and legs. Strength is  0/5 in legs, in arms strength is 2-/5 proximal and 2+/5 distal in left arm and 3 /5 in right arm with extension > flexion weakness.    Sensory: Sensory testing is intact to pinprick, soft touchin all 4 extremities.   Decreased vib in legs  Coordination: Cerebellar testing is poor bilateral due to weakness.  Gait and station: She can not stand or walk.    Reflexes: Deep tendon reflexes show spread at the knees though jerks are poor.      DIAGNOSTIC DATA (LABS, IMAGING, TESTING) - I reviewed patient records, labs, notes, testing and imaging myself where available.  Lab Results  Component Value Date   WBC 7.6 11/09/2015   HGB 10.5 (L) 09/07/2014   HCT 32.7 (L) 11/09/2015   MCV 101 (H) 11/09/2015   PLT 213 11/09/2015      Component Value Date/Time   NA 142 11/09/2015 1351   K 4.0 11/09/2015 1351   CL 106 11/09/2015 1351   CO2 18 11/09/2015 1351   GLUCOSE 110 (H) 11/09/2015 1351   GLUCOSE 117 (H) 05/30/2013 2200   BUN 16 11/09/2015 1351   CREATININE 0.81 11/09/2015 1351   CALCIUM 8.8 11/09/2015 1351   PROT 6.4 11/09/2015 1351   ALBUMIN 4.0 11/09/2015 1351   AST 16 11/09/2015 1351   ALT 14 11/09/2015 1351   ALKPHOS 82 11/09/2015 1351   BILITOT 0.4 11/09/2015 1351   GFRNONAA 84 11/09/2015 1351   GFRAA 97 11/09/2015 1351       ASSESSMENT AND PLAN  Multiple sclerosis (HCC)  Spastic quadriparesis (HCC)  Dysesthesia  Hand weakness  Urinary disorder  Visual hallucination  Subacromial bursitis, right   1.   Continue 200 mg lamotrigine mg tid and baclofen 2.   Continue Aubagio. 3.   Inject right subacromial  bursa injection with 60 mg Depo-Medrol in [place] using sterile technique.   She did feel a little warm afterwards but no numbness or shortness of breath.    4.   She will return for follow-up in 5 months. Call sooner if  new or worsening neurologic symptoms she should call us sooner.  45 minute  face-to-face evaluation with greater than one half of time counseling or coordinating care about her MS and related symptoms.  Arwilda Georgia A. Felecia Shelling, MD, PhD 0/35/5974, 1:63 PM Certified in Neurology, Clinical Neurophysiology, Sleep Medicine, Pain Medicine and Neuroimaging  [facility] [address] [place], [place] [address] [phone]

## 2016-05-31 ENCOUNTER — Ambulatory Visit
Admission: RE | Admit: 2016-05-31 | Discharge: 2016-05-31 | Disposition: A | Discharge status: Home / Self Care | Payer: Managed Care, Other (non HMO) | Source: Ambulatory Visit | Attending: Family Medicine | Admitting: Family Medicine

## 2016-05-31 DIAGNOSIS — Z1231 Encounter for screening mammogram for malignant neoplasm of breast: Secondary | ICD-10-CM

## 2016-06-01 MED ORDER — OXCARBAZEPINE 150 MG PO TABS
150.0000 mg | ORAL_TABLET | Freq: Three times a day (TID) | ORAL | 0 refills | Status: DC
Start: 2016-06-01 — End: 2016-10-09

## 2016-06-01 NOTE — Telephone Encounter (Signed)
I have spoken with Vanessa Short and offered Trileptal 142m po tid, in addition to Baclofen and Lamictal.  Vanessa Short is agreeable. She requests a 90 day supply due to insurance.  [facility] [place]

## 2016-06-01 NOTE — Telephone Encounter (Signed)
Pt called to advise the medication is not helping with the electrical shock in the rt shoulder, down the rt breast. Please call

## 2016-06-01 NOTE — Telephone Encounter (Signed)
Cutler.  Per RAS, ok to add Trileptal 13m po tid to current meds/fim

## 2016-06-01 NOTE — Addendum Note (Signed)
Addended by: France Ravens I on: 06/01/2016 04:44 PM   Modules accepted: Orders

## 2016-06-07 ENCOUNTER — Telehealth: Payer: Self-pay | Admitting: Neurology

## 2016-06-07 NOTE — Telephone Encounter (Signed)
Patient is callling to get the name where her wheel chair will be ordered from. I told the patient per previous notes it was from [place]. The patient still wanted a call back to discuss.

## 2016-06-08 NOTE — Telephone Encounter (Signed)
I have spoken with America this morning and verified that it is [facility] that eval for power w/c referral was sent to/fim

## 2016-07-12 ENCOUNTER — Telehealth: Payer: Self-pay | Admitting: Neurology

## 2016-07-12 NOTE — Telephone Encounter (Signed)
Patient is calling. She has a shock feeling going down the right shoulder, right breast and into the rib cage which she has had for a long time.. What can she do or take for this feeling?  She is leaving for [place] in a week. Please call and advise.

## 2016-07-12 NOTE — Telephone Encounter (Signed)
LMTC./fim 

## 2016-07-13 NOTE — Telephone Encounter (Signed)
Pt returned Faith's call, wants Faith to know that her iphone is working again and is asking that Faith call her at [phone].

## 2016-07-13 NOTE — Telephone Encounter (Signed)
Pt returned RN's call °

## 2016-07-13 NOTE — Telephone Encounter (Signed)
LMTC./fim 

## 2016-07-14 MED ORDER — AMITRIPTYLINE HCL 75 MG PO TABS: 75.0000 mg | ORAL_TABLET | Freq: Every day | ORAL | 1 refills | Status: DC

## 2016-07-14 MED ORDER — LIDOCAINE 5 % EX OINT: 1.0000 "application " | TOPICAL_OINTMENT | Freq: Two times a day (BID) | CUTANEOUS | 3 refills | Status: DC | PRN

## 2016-07-14 NOTE — Telephone Encounter (Signed)
LMTC./fim 

## 2016-07-14 NOTE — Telephone Encounter (Signed)
I have spoken with Vanessa Short this morning.  She reports electric shock feeling in right chest region is worse--can't stand for even a sheet to touch her skin.  She confirms that she is taking Lamictal as rx'd, but isn't sure if she is taking Trileptal.  She is not able to check her meds right now, but will shortly.  I will call her back b/t 0930-10am/fim

## 2016-07-14 NOTE — Telephone Encounter (Signed)
I have spoken with Teran again, and per RAS, advised she may increase Amitriptyline to 75m qhs, also may use Lidocaine 5% ointment to affected area bid.  She is agreeable with this plan.  [facility] [place] [facility] her request/fim

## 2016-07-14 NOTE — Telephone Encounter (Signed)
I have spoken with Videl again--she confirms that she is taking both Lamictal and Trileptal as rx'd.  Will check with RAS for other options and call her back/fim

## 2016-07-14 NOTE — Addendum Note (Signed)
Addended by: France Ravens I on: 07/14/2016 10:14 AM   Modules accepted: Orders

## 2016-07-20 ENCOUNTER — Telehealth: Payer: Self-pay | Admitting: Neurology

## 2016-07-20 NOTE — Telephone Encounter (Signed)
I have spoken with Vanessa Short this afternoon, and per RAS, explained she can try an extra Oxcarbazepine (so increase to 4 daily).  Also can give rx. for Hydrocodone. She verbalized understanding of same, sts. she has not tried Lidocaine ointment yet, as [facility].  I have explained she may actually get more relief from this--she can call other pharmacies to see who has it in stock--when she finds one, [facility] can [facility]. She verbalized understanding of same. Sts. she would like Hydrocodone to be a last resort./fim

## 2016-07-20 NOTE — Telephone Encounter (Signed)
Pt advised the info Dr Felecia Shelling gave is not working. She said she is leaving for [facility] tomorrow. Can leave a VM on phone.

## 2016-07-27 ENCOUNTER — Ambulatory Visit: Payer: Managed Care, Other (non HMO) | Admitting: Physical Therapy

## 2016-08-04 ENCOUNTER — Emergency Department (HOSPITAL_COMMUNITY): Payer: Managed Care, Other (non HMO)

## 2016-08-04 ENCOUNTER — Inpatient Hospital Stay (HOSPITAL_COMMUNITY)
Admission: EM | Admit: 2016-08-04 | Discharge: 2016-08-19 | DRG: 193 | Disposition: A | Discharge status: Skilled Nursing Facility | Payer: Managed Care, Other (non HMO) | Attending: Internal Medicine | Admitting: Internal Medicine

## 2016-08-04 ENCOUNTER — Encounter (HOSPITAL_COMMUNITY): Payer: Self-pay

## 2016-08-04 DIAGNOSIS — Z79899 Other long term (current) drug therapy: Secondary | ICD-10-CM

## 2016-08-04 DIAGNOSIS — J181 Lobar pneumonia, unspecified organism: Secondary | ICD-10-CM

## 2016-08-04 DIAGNOSIS — Y95 Nosocomial condition: Secondary | ICD-10-CM | POA: Diagnosis present

## 2016-08-04 DIAGNOSIS — R29898 Other symptoms and signs involving the musculoskeletal system: Secondary | ICD-10-CM

## 2016-08-04 DIAGNOSIS — D649 Anemia, unspecified: Secondary | ICD-10-CM | POA: Diagnosis present

## 2016-08-04 DIAGNOSIS — I110 Hypertensive heart disease with heart failure: Secondary | ICD-10-CM | POA: Diagnosis present

## 2016-08-04 DIAGNOSIS — I10 Essential (primary) hypertension: Secondary | ICD-10-CM | POA: Diagnosis present

## 2016-08-04 DIAGNOSIS — Z8249 Family history of ischemic heart disease and other diseases of the circulatory system: Secondary | ICD-10-CM

## 2016-08-04 DIAGNOSIS — I5033 Acute on chronic diastolic (congestive) heart failure: Secondary | ICD-10-CM | POA: Diagnosis present

## 2016-08-04 DIAGNOSIS — Z79891 Long term (current) use of opiate analgesic: Secondary | ICD-10-CM

## 2016-08-04 DIAGNOSIS — R0902 Hypoxemia: Secondary | ICD-10-CM

## 2016-08-04 DIAGNOSIS — E669 Obesity, unspecified: Secondary | ICD-10-CM | POA: Diagnosis present

## 2016-08-04 DIAGNOSIS — J189 Pneumonia, unspecified organism: Secondary | ICD-10-CM

## 2016-08-04 DIAGNOSIS — J9601 Acute respiratory failure with hypoxia: Secondary | ICD-10-CM

## 2016-08-04 DIAGNOSIS — R4781 Slurred speech: Secondary | ICD-10-CM

## 2016-08-04 DIAGNOSIS — Z880 Allergy status to penicillin: Secondary | ICD-10-CM

## 2016-08-04 DIAGNOSIS — F339 Major depressive disorder, recurrent, unspecified: Secondary | ICD-10-CM | POA: Diagnosis present

## 2016-08-04 DIAGNOSIS — Z6833 Body mass index (BMI) 33.0-33.9, adult: Secondary | ICD-10-CM

## 2016-08-04 DIAGNOSIS — G8250 Quadriplegia, unspecified: Secondary | ICD-10-CM | POA: Diagnosis present

## 2016-08-04 DIAGNOSIS — R05 Cough: Secondary | ICD-10-CM

## 2016-08-04 DIAGNOSIS — J129 Viral pneumonia, unspecified: Principal | ICD-10-CM | POA: Diagnosis present

## 2016-08-04 DIAGNOSIS — J310 Chronic rhinitis: Secondary | ICD-10-CM | POA: Diagnosis present

## 2016-08-04 DIAGNOSIS — R059 Cough, unspecified: Secondary | ICD-10-CM

## 2016-08-04 DIAGNOSIS — G9341 Metabolic encephalopathy: Secondary | ICD-10-CM | POA: Diagnosis present

## 2016-08-04 DIAGNOSIS — R4182 Altered mental status, unspecified: Secondary | ICD-10-CM

## 2016-08-04 DIAGNOSIS — G35 Multiple sclerosis: Secondary | ICD-10-CM | POA: Diagnosis present

## 2016-08-04 DIAGNOSIS — E876 Hypokalemia: Secondary | ICD-10-CM | POA: Diagnosis present

## 2016-08-04 DIAGNOSIS — K5190 Ulcerative colitis, unspecified, without complications: Secondary | ICD-10-CM | POA: Diagnosis present

## 2016-08-04 DIAGNOSIS — F332 Major depressive disorder, recurrent severe without psychotic features: Secondary | ICD-10-CM | POA: Diagnosis present

## 2016-08-04 DIAGNOSIS — J9690 Respiratory failure, unspecified, unspecified whether with hypoxia or hypercapnia: Secondary | ICD-10-CM

## 2016-08-04 HISTORY — DX: Calculus of kidney: N20.0

## 2016-08-04 LAB — CBC
HCT: 32.8 % — ABNORMAL LOW (ref 36.0–46.0)
HEMOGLOBIN: 10.2 g/dL — AB (ref 12.0–15.0)
MCH: 31.6 pg (ref 26.0–34.0)
MCHC: 31.1 g/dL (ref 30.0–36.0)
MCV: 101.5 fL — AB (ref 78.0–100.0)
Platelets: 147 10*3/uL — ABNORMAL LOW (ref 150–400)
RBC: 3.23 MIL/uL — AB (ref 3.87–5.11)
RDW: 15.1 % (ref 11.5–15.5)
WBC: 12.2 10*3/uL — ABNORMAL HIGH (ref 4.0–10.5)

## 2016-08-04 LAB — I-STAT TROPONIN, ED: Troponin i, poc: 0.01 ng/mL (ref 0.00–0.08)

## 2016-08-04 LAB — COMPREHENSIVE METABOLIC PANEL
ALBUMIN: 3.3 g/dL — AB (ref 3.5–5.0)
ALK PHOS: 67 U/L (ref 38–126)
ALT: 24 U/L (ref 14–54)
AST: 28 U/L (ref 15–41)
Anion gap: 10 (ref 5–15)
BILIRUBIN TOTAL: 0.6 mg/dL (ref 0.3–1.2)
BUN: 16 mg/dL (ref 6–20)
CALCIUM: 8.4 mg/dL — AB (ref 8.9–10.3)
CO2: 23 mmol/L (ref 22–32)
Chloride: 105 mmol/L (ref 101–111)
Creatinine, Ser: 0.98 mg/dL (ref 0.44–1.00)
GFR calc Af Amer: >60 mL/min (ref >60)
GFR calc non Af Amer: >60 mL/min (ref >60)
GLUCOSE: 135 mg/dL — AB (ref 65–99)
Potassium: 3 mmol/L — ABNORMAL LOW (ref 3.5–5.1)
Sodium: 138 mmol/L (ref 135–145)
TOTAL PROTEIN: 6 g/dL — AB (ref 6.5–8.1)

## 2016-08-04 LAB — I-STAT CHEM 8, ED
BUN: 21 mg/dL — AB (ref 6–20)
CALCIUM ION: 0.84 mmol/L — AB (ref 1.15–1.40)
CREATININE: 0.8 mg/dL (ref 0.44–1.00)
Chloride: 104 mmol/L (ref 101–111)
Glucose, Bld: 133 mg/dL — ABNORMAL HIGH (ref 65–99)
HEMATOCRIT: 32 % — AB (ref 36.0–46.0)
Hemoglobin: 10.9 g/dL — ABNORMAL LOW (ref 12.0–15.0)
Potassium: 3.3 mmol/L — ABNORMAL LOW (ref 3.5–5.1)
Sodium: 137 mmol/L (ref 135–145)
TCO2: 26 mmol/L (ref 0–100)

## 2016-08-04 LAB — DIFFERENTIAL
BASOS ABS: 0 10*3/uL (ref 0.0–0.1)
Basophils Relative: 0 %
Eosinophils Absolute: 0 10*3/uL (ref 0.0–0.7)
Eosinophils Relative: 0 %
LYMPHS ABS: 0.9 10*3/uL (ref 0.7–4.0)
LYMPHS PCT: 7 %
Monocytes Absolute: 0.8 10*3/uL (ref 0.1–1.0)
Monocytes Relative: 6 %
NEUTROS PCT: 87 %
Neutro Abs: 10.5 10*3/uL — ABNORMAL HIGH (ref 1.7–7.7)

## 2016-08-04 LAB — PROTIME-INR
INR: 1.06
Prothrombin Time: 13.8 seconds (ref 11.4–15.2)

## 2016-08-04 LAB — APTT: aPTT: 31 seconds (ref 24–36)

## 2016-08-04 LAB — I-STAT CG4 LACTIC ACID, ED: Lactic Acid, Venous: 1.81 mmol/L (ref 0.5–1.9)

## 2016-08-04 MED ORDER — CEFTRIAXONE SODIUM 1 G IJ SOLR
1.0000 g | Freq: Once | INTRAMUSCULAR | Status: AC
  Administered 2016-08-04: 1 g via INTRAVENOUS
  Filled 2016-08-04: qty 10

## 2016-08-04 MED ORDER — DEXTROSE 5 % IV SOLN
500.0000 mg | Freq: Once | INTRAVENOUS | Status: AC
  Administered 2016-08-05: 500 mg via INTRAVENOUS
  Filled 2016-08-04: qty 500

## 2016-08-04 NOTE — ED Triage Notes (Signed)
Per Pt, Pt is coming from home with complaints of cough and congestion that started yesterday. Pt's family reports that patient woke up today and was extremely fatigued. Pt was seen at PCP and was sent over here for hypoxia, fatigue, and slurred speech. Husband reports patient's speech was slurred when he woke up and her "tongue seemed to be fat." Pt has Hx of HTN, but no strokes.

## 2016-08-04 NOTE — ED Provider Notes (Signed)
[facility] DEPT Provider Note   CSN: [id number] Arrival date & time: 08/04/16  1646   History   Chief Complaint Chief Complaint  Patient presents with  . Nasal Congestion  . Fatigue    HPI Vanessa Short is a 50 y.o. female.  HPI   50 year old female with a history of multiple sclerosis presents today with fatigue and reported decreased oxygen saturation. Patient reports that she's had minor URI symptoms over the last week, noting most of her family has had infections. She is waking up this morning and was feeling very tired. Husband is at bedside who provides the majority of her history reporting that when he first saw her this morning she appeared "lethargic" he notes that she was unable to understand some of the information he was telling her, he called the primary care and schedule follow-up evaluation. She reports cough that is slightly productive. At the primary care office she had oxygen saturations in the upper 80s and was sent immediately to the emergency room. Patient denies any specific chest pain, reports very minor shortness of breath, cough, denies abdominal pain, nausea vomiting or diarrhea. She denies any changes to the color clarity or characteristics of her urine. As an antibiotic exposure, no recent hospitalizations.   Husband reports that confusion lasted until reaching the emergency room, at the time my evaluation he reports symptoms have completely resolved. He additionally notes some garbled speech this morning.  Patient has no prior history of stroke, blood clots, significant cardiac history. She is unable to use her upper or lower extremities due to advanced MS, she has full sensation throughout, denies any change in this throughout the day.    Past Medical History:  Diagnosis Date  . Headache   . Hypertension   . Kidney stones   . Movement disorder   . Multiple sclerosis ([place])   . Neuropathy ([place])   . Ulcerative colitis ([place])   . Vision abnormalities      Patient Active Problem List   Diagnosis Date Noted  . Pneumonia 08/05/2016  . Subacromial bursitis 05/09/2016  . Visual hallucination 03/14/2016  . Other fatigue 11/09/2015  . Urinary disorder 11/09/2015  . Ear ache 10/13/2015  . Sore throat 10/13/2015  . Hand weakness 01/06/2015  . Multiple sclerosis ([place]) 09/07/2014  . 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 Dysesthesia 09/07/2014  . Depression 09/07/2014    Past Surgical History:  Procedure Laterality Date  . KIDNEY STONE SURGERY      OB History    No data available       Home Medications    Prior to Admission medications   Medication Sig Start Date End Date Taking? Authorizing Provider  acyclovir (ZOVIRAX) 400 MG tablet Take 400 mg by mouth daily. Take every day per patient 08/11/14  Yes Historical Provider, MD  amitriptyline (ELAVIL) 75 MG tablet Take 1 tablet (75 mg total) by mouth at bedtime. 07/14/16  Yes Britt Bottom, MD  b complex vitamins tablet Take by mouth.   Yes Historical Provider, MD  baclofen (LIORESAL) 10 MG tablet One po up to 4 times a day Patient taking differently: Take 5 mg by mouth 3 (three) times daily. One po up to 4 times a day 11/09/15  Yes Britt Bottom, MD  cholecalciferol (VITAMIN D) 1000 UNITS tablet Take 1,000 Units by mouth daily.   Yes Historical Provider, MD  dantrolene (DANTRIUM) 50 MG capsule TAKE 1 CAPSULE 4 TIMES DAILY 12/17/15  Yes Britt Bottom, MD  FLUoxetine (PROZAC) 40 MG capsule Take 1 capsule (40 mg total) by mouth daily. 09/07/14  Yes Britt Bottom, MD  FOLIC ACID PO Take by mouth.   Yes Historical Provider, MD  ibuprofen (ADVIL,MOTRIN) 200 MG tablet Take 200 mg by mouth 2 (two) times daily.   Yes Historical Provider, MD  labetalol (NORMODYNE) 100 MG tablet Take 100 mg by mouth daily.   Yes Historical Provider, MD  lamoTRIgine (LAMICTAL) 200 MG tablet Take 1 tablet (200 mg total) by mouth 3 (three) times daily. 05/09/16  Yes Britt Bottom, MD  mesalamine  (PENTASA) 500 MG CR capsule Take 1,000 mg by mouth 3 (three) times daily.   Yes Historical Provider, MD  omeprazole (PRILOSEC) 40 MG capsule Take 40 mg by mouth 2 (two) times daily.   Yes Historical Provider, MD  OXcarbazepine (TRILEPTAL) 150 MG tablet Take 1 tablet (150 mg total) by mouth 3 (three) times daily. 06/01/16  Yes Britt Bottom, MD  solifenacin (VESICARE) 10 MG tablet Take 10 mg by mouth daily.   Yes Historical Provider, MD  Teriflunomide (AUBAGIO) 14 MG TABS Take 1 tablet by mouth daily. 03/14/16  Yes Britt Bottom, MD  lidocaine (XYLOCAINE) 5 % ointment Apply 1 application topically 2 (two) times daily as needed. Patient not taking: Reported on 08/04/2016 07/14/16   Britt Bottom, MD    Family History Family History  Problem Relation Age of Onset  . Dementia Mother   . Hypertension Father   . Hyperlipidemia Father   . Diabetes Father   . Heart disease Father     Social History Social History  Substance Use Topics  . Smoking status: Never Smoker  . Smokeless tobacco: Never Used  . Alcohol use No     Allergies   Penicillins   Review of Systems Review of Systems  All other systems reviewed and are negative.    Physical Exam Updated Vital Signs BP 158/83   Pulse 84   Temp 98.3 F (36.8 C) (Oral)   Resp 19   Ht 5' 3"  (1.6 m)   Wt 83.9 kg   SpO2 92%   BMI 32.77 kg/m   Physical Exam  Constitutional: She is oriented to person, place, and time. She appears well-developed and well-nourished.  HENT:  Head: Normocephalic and atraumatic.  Eyes: Conjunctivae are normal. Pupils are equal, round, and reactive to light. Right eye exhibits no discharge. Left eye exhibits no discharge. No scleral icterus.  Neck: Normal range of motion. No JVD present. No tracheal deviation present.  Pulmonary/Chest: Effort normal. No stridor. No respiratory distress. She has no wheezes. She has rales. She exhibits no tenderness.  Neurological: She is alert and oriented to  person, place, and time. Coordination normal.  Pupils equal round reactive to light, extraocular movements are intact, visual fields intact, no facial asymmetry, slurred speech. Sensation grossly intact  Psychiatric: She has a normal mood and affect. Her behavior is normal. Judgment and thought content normal.  Nursing note and vitals reviewed.    ED Treatments / Results  Labs (all labs ordered are listed, but only abnormal results are displayed) Labs Reviewed  CBC - Abnormal; Notable for the following:       Result Value   WBC 12.2 (*)    RBC 3.23 (*)    Hemoglobin 10.2 (*)    HCT 32.8 (*)    MCV 101.5 (*)    Platelets 147 (*)    All other components within normal limits  DIFFERENTIAL - Abnormal; Notable for the following:    Neutro Abs 10.5 (*)    All other components within normal limits  COMPREHENSIVE METABOLIC PANEL - Abnormal; Notable for the following:    Potassium 3.0 (*)    Glucose, Bld 135 (*)    Calcium 8.4 (*)    Total Protein 6.0 (*)    Albumin 3.3 (*)    All other components within normal limits  I-STAT CHEM 8, ED - Abnormal; Notable for the following:    Potassium 3.3 (*)    BUN 21 (*)    Glucose, Bld 133 (*)    Calcium, Ion 0.84 (*)    Hemoglobin 10.9 (*)    HCT 32.0 (*)    All other components within normal limits  PROTIME-INR  APTT  URINALYSIS, ROUTINE W REFLEX MICROSCOPIC  I-STAT TROPOININ, ED  I-STAT CG4 LACTIC ACID, ED  CBG MONITORING, ED    EKG  EKG Interpretation None       Radiology Dg Chest 2 View  Result Date: 08/04/2016 CLINICAL DATA:  Hypoxia with cough EXAM: CHEST  2 VIEW COMPARISON:  05/30/2013 FINDINGS: Right lung is grossly clear. Airspace opacity is present within the left lower lobe and lingula consistent with pneumonia. Small left pleural effusion. Stable mild cardiomegaly. There is mild central vascular congestion. No pneumothorax. Scoliosis of the lower spine. IMPRESSION: 1. Moderate left mid and lower lung infiltrates with  small left pleural effusion. 2. Mild cardiomegaly with central vascular congestion Electronically Signed   By: Donavan Foil M.D.   On: 08/04/2016 22:29   Ct Head Wo Contrast  Result Date: 08/04/2016 CLINICAL DATA:  Slurred speech and lightheadedness overnight. EXAM: CT HEAD WITHOUT CONTRAST TECHNIQUE: Contiguous axial images were obtained from the base of the skull through the vertex without intravenous contrast. COMPARISON:  01/24/2015 FINDINGS: Brain: There is no intracranial hemorrhage, mass or evidence of acute infarction. There is moderate generalized atrophy. There is moderate chronic microvascular ischemic change. There is no significant extra-axial fluid collection. No acute intracranial findings are evident. Vascular: No hyperdense vessel or unexpected calcification. Skull: Normal. Negative for fracture or focal lesion. Sinuses/Orbits: No acute finding. Other: None. IMPRESSION: No acute intracranial findings. There is moderate generalized atrophy and chronic appearing white matter hypodensities which likely represent small vessel ischemic disease. Electronically Signed   By: Andreas Newport M.D.   On: 08/04/2016 18:22    Procedures Procedures (including critical care time)  Medications Ordered in ED Medications  azithromycin (ZITHROMAX) 500 mg in dextrose 5 % 250 mL IVPB (500 mg Intravenous New Bag/Given 08/05/16 0017)  cefTRIAXone (ROCEPHIN) 1 g in dextrose 5 % 50 mL IVPB (0 g Intravenous Stopped 08/05/16 0019)     Initial Impression / Assessment and Plan / ED Course  I have reviewed the triage vital signs and the nursing notes.  Pertinent labs & imaging results that were available during my care of the patient were reviewed by me and considered in my medical decision making (see chart for details).  Clinical Course      Final Clinical Impressions(s) / ED Diagnoses   Final diagnoses:  Community acquired pneumonia of left lower lobe of lung ([place])    Labs: I-STAT Chem-8,  i-STAT lactic acid, i-STAT troponin, CBC, PT/INR, APTT, CMP  Imaging: DG chest 2 view moderate left mid and lower lung infiltrates, CT head  Consults: [facility]: Ceftriaxone, azithromycin  Discharge Meds:   Assessment/Plan:  50 year old female presents today with likely community acquired pneumonia. Patient  hypoxic prior to arrival, low oxygen saturations in the upper 80s low 90s while here. Patient also had some confusion earlier in the day, no confusion or focal neurological deficits throughout my exam. Due to patient's hypoxic episodes, low oxygen saturation here in the ED and baseline health status hospitalist consult for admission who agreed for admission. Patient has no acute changes since arrival here in the ED.   New Prescriptions New Prescriptions   No medications on file     Okey Regal, [name] 08/05/16 0035    Duffy Bruce, MD 08/07/16 [phone]

## 2016-08-05 ENCOUNTER — Encounter (HOSPITAL_COMMUNITY): Payer: Self-pay | Admitting: Family Medicine

## 2016-08-05 ENCOUNTER — Inpatient Hospital Stay (HOSPITAL_COMMUNITY): Payer: Managed Care, Other (non HMO)

## 2016-08-05 DIAGNOSIS — I5031 Acute diastolic (congestive) heart failure: Secondary | ICD-10-CM | POA: Diagnosis not present

## 2016-08-05 DIAGNOSIS — Z6833 Body mass index (BMI) 33.0-33.9, adult: Secondary | ICD-10-CM | POA: Diagnosis not present

## 2016-08-05 DIAGNOSIS — G9341 Metabolic encephalopathy: Secondary | ICD-10-CM | POA: Diagnosis present

## 2016-08-05 DIAGNOSIS — J129 Viral pneumonia, unspecified: Secondary | ICD-10-CM | POA: Diagnosis present

## 2016-08-05 DIAGNOSIS — I5033 Acute on chronic diastolic (congestive) heart failure: Secondary | ICD-10-CM | POA: Diagnosis present

## 2016-08-05 DIAGNOSIS — R4789 Other speech disturbances: Secondary | ICD-10-CM | POA: Diagnosis not present

## 2016-08-05 DIAGNOSIS — R4781 Slurred speech: Secondary | ICD-10-CM | POA: Diagnosis present

## 2016-08-05 DIAGNOSIS — D649 Anemia, unspecified: Secondary | ICD-10-CM | POA: Diagnosis present

## 2016-08-05 DIAGNOSIS — I110 Hypertensive heart disease with heart failure: Secondary | ICD-10-CM | POA: Diagnosis present

## 2016-08-05 DIAGNOSIS — F332 Major depressive disorder, recurrent severe without psychotic features: Secondary | ICD-10-CM | POA: Diagnosis present

## 2016-08-05 DIAGNOSIS — Z79891 Long term (current) use of opiate analgesic: Secondary | ICD-10-CM | POA: Diagnosis not present

## 2016-08-05 DIAGNOSIS — E669 Obesity, unspecified: Secondary | ICD-10-CM | POA: Diagnosis present

## 2016-08-05 DIAGNOSIS — J189 Pneumonia, unspecified organism: Secondary | ICD-10-CM | POA: Diagnosis not present

## 2016-08-05 DIAGNOSIS — K5190 Ulcerative colitis, unspecified, without complications: Secondary | ICD-10-CM | POA: Diagnosis present

## 2016-08-05 DIAGNOSIS — R4182 Altered mental status, unspecified: Secondary | ICD-10-CM | POA: Diagnosis not present

## 2016-08-05 DIAGNOSIS — G35 Multiple sclerosis: Secondary | ICD-10-CM | POA: Diagnosis present

## 2016-08-05 DIAGNOSIS — J181 Lobar pneumonia, unspecified organism: Secondary | ICD-10-CM

## 2016-08-05 DIAGNOSIS — E876 Hypokalemia: Secondary | ICD-10-CM | POA: Diagnosis present

## 2016-08-05 DIAGNOSIS — G8250 Quadriplegia, unspecified: Secondary | ICD-10-CM

## 2016-08-05 DIAGNOSIS — Y95 Nosocomial condition: Secondary | ICD-10-CM | POA: Diagnosis present

## 2016-08-05 DIAGNOSIS — Z810 Family history of intellectual disabilities: Secondary | ICD-10-CM | POA: Diagnosis not present

## 2016-08-05 DIAGNOSIS — Z9889 Other specified postprocedural states: Secondary | ICD-10-CM | POA: Diagnosis not present

## 2016-08-05 DIAGNOSIS — R079 Chest pain, unspecified: Secondary | ICD-10-CM | POA: Diagnosis not present

## 2016-08-05 DIAGNOSIS — Z833 Family history of diabetes mellitus: Secondary | ICD-10-CM | POA: Diagnosis not present

## 2016-08-05 DIAGNOSIS — I10 Essential (primary) hypertension: Secondary | ICD-10-CM | POA: Diagnosis not present

## 2016-08-05 DIAGNOSIS — Z79899 Other long term (current) drug therapy: Secondary | ICD-10-CM | POA: Diagnosis not present

## 2016-08-05 DIAGNOSIS — F329 Major depressive disorder, single episode, unspecified: Secondary | ICD-10-CM | POA: Diagnosis not present

## 2016-08-05 DIAGNOSIS — Z8249 Family history of ischemic heart disease and other diseases of the circulatory system: Secondary | ICD-10-CM | POA: Diagnosis not present

## 2016-08-05 DIAGNOSIS — Z880 Allergy status to penicillin: Secondary | ICD-10-CM | POA: Diagnosis not present

## 2016-08-05 DIAGNOSIS — J9601 Acute respiratory failure with hypoxia: Secondary | ICD-10-CM | POA: Diagnosis not present

## 2016-08-05 DIAGNOSIS — J310 Chronic rhinitis: Secondary | ICD-10-CM | POA: Diagnosis present

## 2016-08-05 LAB — STREP PNEUMONIAE URINARY ANTIGEN: STREP PNEUMO URINARY ANTIGEN: NEGATIVE

## 2016-08-05 MED ORDER — LAMOTRIGINE 100 MG PO TABS
200.0000 mg | ORAL_TABLET | Freq: Three times a day (TID) | ORAL | Status: DC
  Administered 2016-08-05 – 2016-08-19 (×43): 200 mg via ORAL
  Filled 2016-08-05 (×43): qty 2

## 2016-08-05 MED ORDER — GADOBENATE DIMEGLUMINE 529 MG/ML IV SOLN
15.0000 mL | Freq: Once | INTRAVENOUS | Status: AC | PRN
  Administered 2016-08-05: 15 mL via INTRAVENOUS

## 2016-08-05 MED ORDER — LABETALOL HCL 200 MG PO TABS
100.0000 mg | ORAL_TABLET | Freq: Every day | ORAL | Status: DC
  Administered 2016-08-05 – 2016-08-07 (×3): 100 mg via ORAL
  Filled 2016-08-05 (×3): qty 1

## 2016-08-05 MED ORDER — ONDANSETRON HCL 4 MG PO TABS
4.0000 mg | ORAL_TABLET | Freq: Four times a day (QID) | ORAL | Status: DC | PRN
  Administered 2016-08-11 – 2016-08-13 (×2): 4 mg via ORAL
  Filled 2016-08-05 (×2): qty 1

## 2016-08-05 MED ORDER — FLUOXETINE HCL 20 MG PO CAPS
40.0000 mg | ORAL_CAPSULE | Freq: Every day | ORAL | Status: DC
  Administered 2016-08-05 – 2016-08-19 (×15): 40 mg via ORAL
  Filled 2016-08-05 (×13): qty 2
  Filled 2016-08-05: qty 4
  Filled 2016-08-05: qty 2

## 2016-08-05 MED ORDER — ENOXAPARIN SODIUM 40 MG/0.4ML ~~LOC~~ SOLN
40.0000 mg | Freq: Every day | SUBCUTANEOUS | Status: DC
  Administered 2016-08-05 – 2016-08-19 (×15): 40 mg via SUBCUTANEOUS
  Filled 2016-08-05 (×15): qty 0.4

## 2016-08-05 MED ORDER — LORAZEPAM 2 MG/ML IJ SOLN
0.5000 mg | Freq: Once | INTRAMUSCULAR | Status: AC
  Administered 2016-08-05: 0.5 mg via INTRAVENOUS

## 2016-08-05 MED ORDER — MESALAMINE ER 250 MG PO CPCR
1000.0000 mg | ORAL_CAPSULE | Freq: Three times a day (TID) | ORAL | Status: DC
  Administered 2016-08-05 – 2016-08-19 (×43): 1000 mg via ORAL
  Filled 2016-08-05 (×48): qty 4

## 2016-08-05 MED ORDER — TERIFLUNOMIDE 14 MG PO TABS
1.0000 | ORAL_TABLET | Freq: Every day | ORAL | Status: DC
  Administered 2016-08-07: 1 via ORAL
  Filled 2016-08-05: qty 1

## 2016-08-05 MED ORDER — DEXTROSE 5 % IV SOLN
1.0000 g | INTRAVENOUS | Status: DC
  Administered 2016-08-06 – 2016-08-09 (×4): 1 g via INTRAVENOUS
  Filled 2016-08-05 (×5): qty 10

## 2016-08-05 MED ORDER — AMITRIPTYLINE HCL 25 MG PO TABS
75.0000 mg | ORAL_TABLET | Freq: Every day | ORAL | Status: DC
Start: 2016-08-05 — End: 2016-08-19
  Administered 2016-08-05 – 2016-08-18 (×15): 75 mg via ORAL
  Filled 2016-08-05 (×16): qty 3

## 2016-08-05 MED ORDER — POTASSIUM CHLORIDE CRYS ER 20 MEQ PO TBCR
40.0000 meq | EXTENDED_RELEASE_TABLET | Freq: Two times a day (BID) | ORAL | Status: AC
  Administered 2016-08-05 (×2): 40 meq via ORAL
  Filled 2016-08-05 (×2): qty 2

## 2016-08-05 MED ORDER — DARIFENACIN HYDROBROMIDE ER 15 MG PO TB24
15.0000 mg | ORAL_TABLET | Freq: Every day | ORAL | Status: DC
  Administered 2016-08-05 – 2016-08-19 (×15): 15 mg via ORAL
  Filled 2016-08-05 (×15): qty 1

## 2016-08-05 MED ORDER — OXCARBAZEPINE 150 MG PO TABS
150.0000 mg | ORAL_TABLET | Freq: Three times a day (TID) | ORAL | Status: DC
  Administered 2016-08-05 – 2016-08-19 (×43): 150 mg via ORAL
  Filled 2016-08-05 (×48): qty 1

## 2016-08-05 MED ORDER — ACYCLOVIR 400 MG PO TABS
400.0000 mg | ORAL_TABLET | Freq: Every day | ORAL | Status: DC
  Administered 2016-08-05 – 2016-08-19 (×15): 400 mg via ORAL
  Filled 2016-08-05 (×16): qty 1

## 2016-08-05 MED ORDER — AZITHROMYCIN 500 MG PO TABS
500.0000 mg | ORAL_TABLET | ORAL | Status: DC
  Administered 2016-08-06 – 2016-08-09 (×4): 500 mg via ORAL
  Filled 2016-08-05: qty 2
  Filled 2016-08-05 (×3): qty 1
  Filled 2016-08-05: qty 2

## 2016-08-05 MED ORDER — PANTOPRAZOLE SODIUM 40 MG PO TBEC
80.0000 mg | DELAYED_RELEASE_TABLET | Freq: Every day | ORAL | Status: DC
  Administered 2016-08-05 – 2016-08-07 (×3): 80 mg via ORAL
  Filled 2016-08-05 (×3): qty 2

## 2016-08-05 MED ORDER — ACETAMINOPHEN 650 MG RE SUPP: 650.0000 mg | Freq: Four times a day (QID) | RECTAL | Status: DC | PRN

## 2016-08-05 MED ORDER — ONDANSETRON HCL 4 MG/2ML IJ SOLN
4.0000 mg | Freq: Four times a day (QID) | INTRAMUSCULAR | Status: DC | PRN
  Administered 2016-08-07 – 2016-08-12 (×3): 4 mg via INTRAVENOUS
  Filled 2016-08-05 (×3): qty 2

## 2016-08-05 MED ORDER — DANTROLENE SODIUM 25 MG PO CAPS
50.0000 mg | ORAL_CAPSULE | Freq: Three times a day (TID) | ORAL | Status: DC
  Administered 2016-08-05 – 2016-08-10 (×20): 50 mg via ORAL
  Administered 2016-08-10: 25 mg via ORAL
  Administered 2016-08-10 – 2016-08-19 (×33): 50 mg via ORAL
  Filled 2016-08-05 (×65): qty 2

## 2016-08-05 MED ORDER — ASPIRIN 81 MG PO CHEW
81.0000 mg | CHEWABLE_TABLET | Freq: Every day | ORAL | Status: DC
  Administered 2016-08-05 – 2016-08-19 (×15): 81 mg via ORAL
  Filled 2016-08-05 (×15): qty 1

## 2016-08-05 MED ORDER — LORAZEPAM 2 MG/ML IJ SOLN
INTRAMUSCULAR | Status: AC
  Filled 2016-08-05: qty 1

## 2016-08-05 MED ORDER — LORAZEPAM 2 MG/ML IJ SOLN
INTRAMUSCULAR | Status: AC
  Administered 2016-08-05: 0.5 mg via INTRAVENOUS
  Filled 2016-08-05: qty 1

## 2016-08-05 MED ORDER — BACLOFEN 10 MG PO TABS
5.0000 mg | ORAL_TABLET | Freq: Three times a day (TID) | ORAL | Status: DC
  Administered 2016-08-05 – 2016-08-13 (×26): 5 mg via ORAL
  Filled 2016-08-05 (×27): qty 1

## 2016-08-05 MED ORDER — ACETAMINOPHEN 325 MG PO TABS
650.0000 mg | ORAL_TABLET | Freq: Four times a day (QID) | ORAL | Status: DC | PRN
  Administered 2016-08-06 – 2016-08-15 (×3): 650 mg via ORAL
  Filled 2016-08-05 (×3): qty 2

## 2016-08-05 MED ORDER — GUAIFENESIN ER 600 MG PO TB12
600.0000 mg | ORAL_TABLET | Freq: Two times a day (BID) | ORAL | Status: DC
  Administered 2016-08-05 – 2016-08-07 (×6): 600 mg via ORAL
  Filled 2016-08-05 (×6): qty 1

## 2016-08-05 NOTE — Progress Notes (Signed)
Patient admitted to room 5w33 from ED at this time via stretcher. Patient has advanced MS and is a total care patient. She has a Building services engineer at her home per her husband every day during the day. Alert and oriented x4. Speech clear. Patient has her motorized wheelchair with her in the room. She is able to mover her right hand some but cannot move her left hand or either leg on her own. Admitted for left lower lobe PNA. O2 is on at 2lpm via [place]. Pulse ox 97%. No SOB noted. Loose non-productive cough noted. Patient is aware that a sputum sample is needed. Rales noted to Bilateral lung fields. All skin is clean dry and intact. Verified by Erich Montane RN   IV to left forearm intact. Flushes well with good blood return. Saline locked at this time.  No redness or swelling noted. See H&P for PMH. No s/s of distress noted. Will continue to monitor and treat per MD orders.

## 2016-08-05 NOTE — Progress Notes (Signed)
PROGRESS NOTE                                                                                                                                                                                                             Patient Demographics:    Vanessa Short, is a 50 y.o. female, DOB - [birth year], BTY:[id number]  Admit date - 08/04/2016   Admitting Physician Edwin Dada, MD  Outpatient Primary MD for the patient is [facility] - 0   Chief Complaint  Patient presents with  . Nasal Congestion  . Fatigue       Brief Narrative    50 y.o. female with a past medical history significant for multiple sclerosis with quadruplegia who presents with 2 days cough as well as confusion and slurred speech, Workup significant for CAP  Subjective:    Vanessa Short today has, No headache, No chest pain, No abdominal pain - No Nausea, No further slurred speech, reports cough, feels congested, unable to produce any phlegm.    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 - Follow on Urine culture and sputum cultures and urine strep  antigen    - RT for chest physiotherapy, IS - Supplemental O2 as needed  MS with quadruplegia:  - Continue Aubagio, dantrolene and baclofen - Continue amitriptyline and lamotrigine and Trileptal for dysesthesias - Continue Vesicare - MRI with abnormal finding in basal ganglia, neurology consulted, will obtain  MRI brain with contrast  Hypokalemia:  - Mild.Check mag, Orally replace  Anemia:  - Chronic,  unchanged.  Slurred speech:  - Suspect a metabolic encephalopathy from pneumonia in setting of MS.  - MRI brain with abnormal findings in basal ganglia, ischemic versus active demyelination, neurology consulted, obtain MRI brain with contrast  Ulcerative colitis: - Continue mesalamine  Depression: - Continue fluoxetine  Hypertension: - Continue labetalol  Other medications:  - Continue acyclovir   Code Status :  Full  Family Communication  : Husband at bedside  Disposition Plan  : Home when stable  Consults  :  neurology  Procedures  : None  DVT Prophylaxis  :  Lovenox   Lab Results  Component Value Date   PLT 147 (L) 08/04/2016    Antibiotics  :    Anti-infectives    Start     Dose/Rate Route Frequency Ordered Stop   08/06/16 2200  cefTRIAXone (ROCEPHIN) 1 g in dextrose 5 % 50 mL IVPB     1 g 100 mL/hr over 30 Minutes Intravenous Every 24 hours 08/05/16 0251 08/13/16 2159   08/06/16 2200  azithromycin (ZITHROMAX) tablet 500 mg     500 mg Oral Every 24 hours 08/05/16 0251 08/13/16 2159   08/05/16 1000  acyclovir (ZOVIRAX) tablet 400 mg    Comments:  Take every day per patient     400 mg Oral Daily 08/05/16 0251     08/04/16 2300  cefTRIAXone (ROCEPHIN) 1 g in dextrose 5 % 50 mL IVPB     1 g 100 mL/hr over 30 Minutes Intravenous  Once 08/04/16 2252 08/05/16 0019   08/04/16 2300  azithromycin (ZITHROMAX) 500 mg in dextrose 5 % 250 mL IVPB     500 mg 250 mL/hr over 60 Minutes Intravenous  Once 08/04/16 2252 08/05/16 0128        Objective:   Vitals:   08/05/16 0115 08/05/16 0257 08/05/16 0549 08/05/16 1403  BP:  132/70 (!) 112/51 121/65  Pulse: 84 84 75 82  Resp: 23 18 18 18   Temp:  98.3 F (36.8 C) 98.2 F (36.8 C) 98.7 F (37.1 C)  TempSrc:  Oral Oral Oral  SpO2: 93% 97% 94% 93%  Weight:  85 kg (187 lb 4.8 oz)    Height:  5' 3"  (1.6 m)      Wt Readings from Last 3 Encounters:  08/05/16 85 kg (187 lb 4.8 oz)  05/09/16 79.4 kg (175 lb)   11/09/15 79.4 kg (175 lb)     Intake/Output Summary (Last 24 hours) at 08/05/16 1424 Last data filed at 08/05/16 1243  Gross per 24 hour  Intake              300 ml  Output              590 ml  Net             -290 ml     Physical Exam  Awake Alert, Oriented X 3,  Supple Neck,No JVD,  Symmetrical Chest wall movement, Diminished air movement bilaterally, poor inspiratory for RRR,No Gallops,Rubs or new Murmurs, No Parasternal Heave +ve B.Sounds, Abd Soft, No tenderness,  No rebound - guarding or rigidity. No Cyanosis, Clubbing or edema, No new Rash or bruise      Data Review:    CBC  Recent Labs Lab 08/04/16 1731 08/04/16 1807  WBC 12.2*  --   HGB 10.2* 10.9*  HCT 32.8* 32.0*  PLT 147*  --   MCV 101.5*  --   MCH 31.6  --   MCHC 31.1  --   RDW 15.1  --   LYMPHSABS 0.9  --   MONOABS 0.8  --   EOSABS 0.0  --   BASOSABS 0.0  --     Chemistries   Recent Labs Lab 08/04/16 1731 08/04/16 1807  NA 138 137  K 3.0* 3.3*  CL 105 104  CO2 23  --   GLUCOSE  135* 133*  BUN 16 21*  CREATININE 0.98 0.80  CALCIUM 8.4*  --   AST 28  --   ALT 24  --   ALKPHOS 67  --   BILITOT 0.6  --    ------------------------------------------------------------------------------------------------------------------ No results for input(s): CHOL, HDL, LDLCALC, TRIG, CHOLHDL, LDLDIRECT in the last 72 hours.  No results found for: HGBA1C ------------------------------------------------------------------------------------------------------------------ No results for input(s): TSH, T4TOTAL, T3FREE, THYROIDAB in the last 72 hours.  Invalid input(s): FREET3 ------------------------------------------------------------------------------------------------------------------ No results for input(s): VITAMINB12, FOLATE, FERRITIN, TIBC, IRON, RETICCTPCT in the last 72 hours.  Coagulation profile  Recent Labs Lab 08/04/16 1731  INR 1.06    No results for input(s): DDIMER in the last  72 hours.  Cardiac Enzymes No results for input(s): CKMB, TROPONINI, MYOGLOBIN in the last 168 hours.  Invalid input(s): CK ------------------------------------------------------------------------------------------------------------------ No results found for: BNP  Inpatient Medications  Scheduled Meds: . acyclovir  400 mg Oral Daily  . amitriptyline  75 mg Oral QHS  . [START ON 08/06/2016] azithromycin  500 mg Oral Q24H  . baclofen  5 mg Oral TID  . [START ON 08/06/2016] cefTRIAXone (ROCEPHIN)  IV  1 g Intravenous Q24H  . dantrolene  50 mg Oral TID AC & HS  . darifenacin  15 mg Oral Daily  . enoxaparin (LOVENOX) injection  40 mg Subcutaneous Daily  . FLUoxetine  40 mg Oral Daily  . guaiFENesin  600 mg Oral BID  . labetalol  100 mg Oral Daily  . lamoTRIgine  200 mg Oral TID  . mesalamine  1,000 mg Oral TID  . OXcarbazepine  150 mg Oral TID  . pantoprazole  80 mg Oral Daily  . Teriflunomide  1 tablet Oral Daily   Continuous Infusions: PRN Meds:.acetaminophen **OR** acetaminophen, ondansetron **OR** ondansetron (ZOFRAN) IV  Micro Results No results found for this or any previous visit (from the past 240 hour(s)).  Radiology Reports Dg Chest 2 View  Result Date: 08/04/2016 CLINICAL DATA:  Hypoxia with cough EXAM: CHEST  2 VIEW COMPARISON:  05/30/2013 FINDINGS: Right lung is grossly clear. Airspace opacity is present within the left lower lobe and lingula consistent with pneumonia. Small left pleural effusion. Stable mild cardiomegaly. There is mild central vascular congestion. No pneumothorax. Scoliosis of the lower spine. IMPRESSION: 1. Moderate left mid and lower lung infiltrates with small left pleural effusion. 2. Mild cardiomegaly with central vascular congestion Electronically Signed   By: Donavan Foil M.D.   On: 08/04/2016 22:29   Ct Head Wo Contrast  Result Date: 08/04/2016 CLINICAL DATA:  Slurred speech and lightheadedness overnight. EXAM: CT HEAD WITHOUT CONTRAST  TECHNIQUE: Contiguous axial images were obtained from the base of the skull through the vertex without intravenous contrast. COMPARISON:  01/24/2015 FINDINGS: Brain: There is no intracranial hemorrhage, mass or evidence of acute infarction. There is moderate generalized atrophy. There is moderate chronic microvascular ischemic change. There is no significant extra-axial fluid collection. No acute intracranial findings are evident. Vascular: No hyperdense vessel or unexpected calcification. Skull: Normal. Negative for fracture or focal lesion. Sinuses/Orbits: No acute finding. Other: None. IMPRESSION: No acute intracranial findings. There is moderate generalized atrophy and chronic appearing white matter hypodensities which likely represent small vessel ischemic disease. Electronically Signed   By: Andreas Newport M.D.   On: 08/04/2016 18:22   Mr Brain Wo Contrast  Result Date: 08/05/2016 CLINICAL DATA:  Altered mental status EXAM: MRI HEAD WITHOUT CONTRAST TECHNIQUE: Multiplanar, multiecho pulse sequences of the brain and surrounding structures were  obtained without intravenous contrast. COMPARISON:  Head CT 08/04/2016 Brain MRI 01/24/2015 FINDINGS: Brain: Punctate focus of high signal on the diffusion-weighted images at the posterior aspect of the right basal ganglia is unchanged compared to 01/24/2015. There is an additional punctate focus of diffusion restriction in the posterior aspect of the left basal ganglia There is severe white matter disease with confluent lesions in the periventricular white matter, oriented perpendicularly to the long axis of the lateral ventricles. There is atrophy of the corpus callosum. There are numerous low T1 weighted signal lesions. No mass lesion or midline shift. No hydrocephalus or extra-axial fluid collection. There is a left middle cranial fossa arachnoid cyst. 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 Vascular: Major  intracranial arterial and venous sinus flow voids are preserved. No evidence of chronic microhemorrhage or amyloid angiopathy. Skull and upper cervical spine: The visualized skull base, calvarium, upper cervical spine and extracranial soft tissues are normal. Sinuses/Orbits: No fluid levels or advanced mucosal thickening. No mastoid effusion. Normal orbits. IMPRESSION: 1. Severe white matter disease in a pattern consistent with multiple sclerosis. Unchanged distribution of T2 hyperintense lesions. 2. Punctate focus of diffusion restriction at the posterior aspect of the left basal ganglia. These may indicate a tiny area of acute ischemia; however, active demyelinating MS plaques may uncommonly show diffusion restriction. Contrast administration might be helpful for differentiation, if necessary. Electronically Signed   By: Ulyses Jarred M.D.   On: 08/05/2016 03:27      [facility], Angle Karel M.D on 08/05/2016 at 2:24 PM  Between 7am to 7pm - Pager - [phone]  After 7pm go to [email or web address] - password [facility]  [facility] -  Office  [phone]

## 2016-08-05 NOTE — Consult Note (Signed)
Neurology Consultation Reason for Consult: Abnormal MRI Referring Physician: Elgergawy, D  CC: Slurred speech  History is obtained from: Patient, husband  HPI: Vanessa Short is a 50 y.o. female with a history of multiple sclerosis and severe quadriparesis. She does have some movement of her right arm with which she is able to typically drive her cart. She is here with pneumonia. Yesterday, she was complaining of some mild slurred speech and therefore an MRI was done does show a punctate focus of restricted diffusion in the right internal capsule which does not enhance.  She also notes that her right arm may be slightly weaker, that is only very mildly different than typically.  LKW: Unclear tpa given?: no, unclear time of onset    ROS: A 14 point ROS was performed and is negative except as noted in the HPI.   Past Medical History:  Diagnosis Date  . Headache   . Hypertension   . Kidney stones   . Movement disorder   . Multiple sclerosis ([place])   . Neuropathy ([place])   . Ulcerative colitis ([place])   . Vision abnormalities      Family History  Problem Relation Age of Onset  . Dementia Mother   . Hypertension Father   . Hyperlipidemia Father   . Diabetes Father   . Heart disease Father      Social History:  reports that she has never smoked. She has never used smokeless tobacco. She reports that she does not drink alcohol or use drugs.   Exam: Current vital signs: BP 121/65 (BP Location: Left Arm)   Pulse 82   Temp 98.7 F (37.1 C) (Oral)   Resp 18   Ht 5' 3"  (1.6 m)   Wt 85 kg (187 lb 4.8 oz)   SpO2 93%   BMI 33.18 kg/m  Vital signs in last 24 hours: Temp:  [98.2 F (36.8 C)-98.7 F (37.1 C)] 98.7 F (37.1 C) (12/23 1403) Pulse Rate:  [75-89] 82 (12/23 1403) Resp:  [18-27] 18 (12/23 1403) BP: (112-174)/(51-87) 121/65 (12/23 1403) SpO2:  [90 %-97 %] 93 % (12/23 1403) FiO2 (%):  [2 %] 2 % (12/23 0549) Weight:  [85 kg (187 lb 4.8 oz)] 85 kg (187 lb 4.8 oz)  (12/23 0257)   Physical Exam  Constitutional: Appears well-developed and well-nourished.  Psych: Affect appropriate to situation Eyes: No scleral injection HENT: No OP obstrucion Head: Normocephalic.  Cardiovascular: Normal rate and regular rhythm.  Respiratory: Coughing frequently GI: Soft.  No distension. There is no tenderness.  Skin: WDI  Neuro: Mental Status: Patient is awake, alert, oriented to person, place, month, year, and situation. Patient is able to give a clear and coherent history. No signs of aphasia or neglect Cranial Nerves: II: Visual Fields are full. Pupils are equal, round, and reactive to light.   III,IV, VI: EOMI without ptosis or diploplia.  V: Facial sensation is symmetric to temperature VII: Facial movement is symmetric.  VIII: hearing is intact to voice X: Uvula elevates symmetrically XI: Shoulder shrug is symmetric. XII: tongue is midline without atrophy or fasciculations.  Motor: She has a severe spastic quadriparesis, but does have some ability to grip and internally rotate her right arm Sensory: Intact light touch Cerebellar: Unable to perform   I have reviewed labs in epic and the results pertinent to this consultation are: Normal creatinine  I have reviewed the images obtained: MRI brain-punctate focus of restricted diffusion bilaterally, neither enhance,   Impression: 50 year old female with  punctate area of restricted diffusion in the subcortical white matter. It is unclear if this represents demyelinating disease versus small ischemic stroke. I think that baby aspirin and checking her A1c and LDL her relatively low risk interventions, and therefore even though I suspect this is likely more limited due to her demyelinating disease would be the wrist to pursue this.  Recommendations: 1) A1c, LDL, 81 mg aspirin daily 2) would favor holding off steroids. 3) could consider cervical spine MRI with/without contrast given arm weakness, will  discuss it with patient tomorrow(cannot do contrast MRI disclosed together anyway.) 4) we'll continue to follow   Roland Rack, MD [facility] [phone]  If 7pm- 7am, please page neurology on call as listed in [place].

## 2016-08-05 NOTE — ED Notes (Signed)
Pt transported to MRI 

## 2016-08-05 NOTE — H&P (Signed)
History and Physical  Patient Name: Vanessa Short     JKD:[id number]    DOB: [birth year]    DOA: 08/04/2016 PCP: [facility]   Patient coming from: Home --> PCP's office --> ER  Chief Complaint: Cough, dyspnea  HPI: Vanessa Short is a 50 y.o. female with a past medical history significant for multiple sclerosis with quadruplegia who presents with 2 days cough as well as confusion and slurred speech.  The patient had onset of cough about three days ago.  The patient and her husband note that they were recently at [facility] with family, after which her husband had a chest cold, and now she has developed similar symptoms.  In addition to nasal congestion, runny nose, sneezing, she has chest congestion, feeling of dyspnea and persistent, rattling, severe, non-productive cough, worsening oer the last two days.  This morning, per husband, she woke up "lethargic" and mentally slowed, and with slurred speech.  They went to her PCP's office where she was noted to be hypoxic to 83-86% on room air and sent immediately to the ER.  ED course: -Afebrile, heart rat 78, respirations normal, but pulse ox drops to 88%, BP 121/71 -Na 138, K 3.0, Cr 0.98 (baseline 0.9), WBC 12.2K, Hgb 10.2 (baseline) -Lactic acid normal -CXR showed left lower lobe opacity, and so she was given azithromycin and ceftriaxone and TRH were asked to evaluate for pneumonia      ROS: Review of Systems  Constitutional: Positive for malaise/fatigue. Negative for chills and fever.  Respiratory: Positive for cough and shortness of breath. Negative for sputum production.   Neurological: Positive for speech change and weakness.  All other systems reviewed and are negative.         Past Medical History:  Diagnosis Date  . Headache   . Hypertension   . Kidney stones   . Movement disorder   . Multiple sclerosis ([place])   . Neuropathy ([place])   . Ulcerative colitis ([place])   . Vision abnormalities       Past Surgical History:  Procedure Laterality Date  . KIDNEY STONE SURGERY      Social History: Patient lives with her husband and daughter.  The patient is wheelchair bound.  She is from [place].  She is not a smoker.  Worked in [employer] business, before being disabled by [employer].    Allergies  Allergen Reactions  . Penicillins Rash    Has patient had a PCN reaction causing immediate rash, facial/tongue/throat swelling, SOB or lightheadedness with hypotension:YES Has patient had a PCN reaction causing severe rash involving mucus membranes or skin necrosis: NO Has patient had a PCN reaction that required hospitalization NO Has patient had a PCN reaction occurring within the last 10 years:NO If all of the above answers are "NO", then may proceed with Cephalosporin use.    Family history: family history includes Dementia in her mother; Diabetes in her father; Heart disease in her father; Hyperlipidemia in her father; Hypertension in her father.  Prior to Admission medications   Medication Sig Start Date End Date Taking? Authorizing Provider  acyclovir (ZOVIRAX) 400 MG tablet Take 400 mg by mouth daily. Take every day per patient 08/11/14  Yes Historical Provider, MD  amitriptyline (ELAVIL) 75 MG tablet Take 1 tablet (75 mg total) by mouth at bedtime. 07/14/16  Yes Britt Bottom, MD  b complex vitamins tablet Take by mouth.   Yes Historical Provider, MD  baclofen (LIORESAL) 10 MG tablet  One po up to 4 times a day Patient taking differently: Take 5 mg by mouth 3 (three) times daily. One po up to 4 times a day 11/09/15  Yes Britt Bottom, MD  cholecalciferol (VITAMIN D) 1000 UNITS tablet Take 1,000 Units by mouth daily.   Yes Historical Provider, MD  dantrolene (DANTRIUM) 50 MG capsule TAKE 1 CAPSULE 4 TIMES DAILY 12/17/15  Yes Britt Bottom, MD  FLUoxetine (PROZAC) 40 MG capsule Take 1 capsule (40 mg total) by mouth daily. 09/07/14  Yes Britt Bottom, MD  FOLIC ACID PO  Take by mouth.   Yes Historical Provider, MD  ibuprofen (ADVIL,MOTRIN) 200 MG tablet Take 200 mg by mouth 2 (two) times daily.   Yes Historical Provider, MD  labetalol (NORMODYNE) 100 MG tablet Take 100 mg by mouth daily.   Yes Historical Provider, MD  lamoTRIgine (LAMICTAL) 200 MG tablet Take 1 tablet (200 mg total) by mouth 3 (three) times daily. 05/09/16  Yes Britt Bottom, MD  mesalamine (PENTASA) 500 MG CR capsule Take 1,000 mg by mouth 3 (three) times daily.   Yes Historical Provider, MD  omeprazole (PRILOSEC) 40 MG capsule Take 40 mg by mouth 2 (two) times daily.   Yes Historical Provider, MD  OXcarbazepine (TRILEPTAL) 150 MG tablet Take 1 tablet (150 mg total) by mouth 3 (three) times daily. 06/01/16  Yes Britt Bottom, MD  solifenacin (VESICARE) 10 MG tablet Take 10 mg by mouth daily.   Yes Historical Provider, MD  Teriflunomide (AUBAGIO) 14 MG TABS Take 1 tablet by mouth daily. 03/14/16  Yes Britt Bottom, MD  lidocaine (XYLOCAINE) 5 % ointment Apply 1 application topically 2 (two) times daily as needed. Patient not taking: Reported on 08/04/2016 07/14/16   Britt Bottom, MD       Physical Exam: BP 159/79   Pulse 86   Temp 98.3 F (36.8 C) (Oral)   Resp 26   Ht 5' 3"  (1.6 m)   Wt 83.9 kg (185 lb)   SpO2 88%   BMI 32.77 kg/m  General appearance: Wheelchair bound debilitated adult female, alert and in no acute distress, appears tired, listless.   Eyes: Anicteric, conjunctiva pink, lids and lashes normal. PERRL.    ENT: No nasal deformity, epistaxis.  Hearing normal. OP moist without lesions.   Neck: No neck masses.  Trachea midline.  No thyromegaly/tenderness. Lymph: No cervical or supraclavicular lymphadenopathy. Skin: Warm and dry.  No jaundice.  No suspicious rashes or lesions. Cardiac: RRR, nl S1-S2, no murmurs appreciated.  Capillary refill is brisk.  JVP not visible.  No LE edema.  Radial pulses 2+ and symmetric. Respiratory: Normal respiratory rate and rhythm.   Weak chest excursion.  Rales at bilateral bases, diminihsed more on left. Abdomen: No TTP but abdomen large, not ascites, but distended.   MSK: Contractures.  Right arm swelling, mild. Neuro: Cranial nerves 3-12 intact.  Sensation intact to light touch. Speech is fluent.  Muscle strength globally 2/5, symmetric.  Quadruplegic.  Psych: Sensorium intact and responding to questions, attention normal but tired.  Behavior appropriate.  Affect normal.  Judgment and insight appear normal.     Labs on Admission:  I have personally reviewed following labs and imaging studies: CBC:  Recent Labs Lab 08/04/16 1731 08/04/16 1807  WBC 12.2*  --   NEUTROABS 10.5*  --   HGB 10.2* 10.9*  HCT 32.8* 32.0*  MCV 101.5*  --   PLT 147*  --  Basic Metabolic Panel:  Recent Labs Lab 08/04/16 1731 08/04/16 1807  NA 138 137  K 3.0* 3.3*  CL 105 104  CO2 23  --   GLUCOSE 135* 133*  BUN 16 21*  CREATININE 0.98 0.80  CALCIUM 8.4*  --    GFR: Estimated Creatinine Clearance: 85.4 mL/min (by C-G formula based on SCr of 0.8 mg/dL).  Liver Function Tests:  Recent Labs Lab 08/04/16 1731  AST 28  ALT 24  ALKPHOS 67  BILITOT 0.6  PROT 6.0*  ALBUMIN 3.3*   No results for input(s): LIPASE, AMYLASE in the last 168 hours. No results for input(s): AMMONIA in the last 168 hours. Coagulation Profile:  Recent Labs Lab 08/04/16 1731  INR 1.06   Cardiac Enzymes: No results for input(s): CKTOTAL, CKMB, CKMBINDEX, TROPONINI in the last 168 hours. BNP (last 3 results) No results for input(s): PROBNP in the last 8760 hours. HbA1C: No results for input(s): HGBA1C in the last 72 hours. CBG: No results for input(s): GLUCAP in the last 168 hours. Lipid Profile: No results for input(s): CHOL, HDL, LDLCALC, TRIG, CHOLHDL, LDLDIRECT in the last 72 hours. Thyroid Function Tests: No results for input(s): TSH, T4TOTAL, FREET4, T3FREE, THYROIDAB in the last 72 hours. Anemia Panel: No results for  input(s): VITAMINB12, FOLATE, FERRITIN, TIBC, IRON, RETICCTPCT in the last 72 hours. Sepsis Labs: Lactic acid normal Invalid input(s): PROCALCITONIN, LACTICIDVEN No results found for this or any previous visit (from the past 240 hour(s)).       Radiological Exams on Admission: Personally reviewed CXR shows LL opacity, CT head unremarkable: Dg Chest 2 View  Result Date: 08/04/2016 CLINICAL DATA:  Hypoxia with cough EXAM: CHEST  2 VIEW COMPARISON:  05/30/2013 FINDINGS: Right lung is grossly clear. Airspace opacity is present within the left lower lobe and lingula consistent with pneumonia. Small left pleural effusion. Stable mild cardiomegaly. There is mild central vascular congestion. No pneumothorax. Scoliosis of the lower spine. IMPRESSION: 1. Moderate left mid and lower lung infiltrates with small left pleural effusion. 2. Mild cardiomegaly with central vascular congestion Electronically Signed   By: Donavan Foil M.D.   On: 08/04/2016 22:29   Ct Head Wo Contrast  Result Date: 08/04/2016 CLINICAL DATA:  Slurred speech and lightheadedness overnight. EXAM: CT HEAD WITHOUT CONTRAST TECHNIQUE: Contiguous axial images were obtained from the base of the skull through the vertex without intravenous contrast. COMPARISON:  01/24/2015 FINDINGS: Brain: There is no intracranial hemorrhage, mass or evidence of acute infarction. There is moderate generalized atrophy. There is moderate chronic microvascular ischemic change. There is no significant extra-axial fluid collection. No acute intracranial findings are evident. Vascular: No hyperdense vessel or unexpected calcification. Skull: Normal. Negative for fracture or focal lesion. Sinuses/Orbits: No acute finding. Other: None. IMPRESSION: No acute intracranial findings. There is moderate generalized atrophy and chronic appearing white matter hypodensities which likely represent small vessel ischemic disease. Electronically Signed   By: Andreas Newport M.D.    On: 08/04/2016 18:22    EKG: Independently reviewed. Rate 80, QTc 454, sinus.    Assessment/Plan  1. Pneumonia:    -Ceftriaxone and azithromycin  -Urine strep antigen -Sputum culture and gram stain if able -Consult to RT for chest physiotherapy, IS -Supplemental O2 as needed    2. MS with quadruplegia:  -Continue Aubagio -Continue dantrolene and baclofen -Continue amitriptyline and lamotrigine and Trileptal for dysesthesias -Continue Vesicare  3. Hypokalemia:  Mild. -Check mag -Orally replace  4. Anemia:  Chronic, unchanged.  5. Slurred speech:  Suspect a metabolic encephalopathy from pneumonia in setting of MS.  -MRI ordered  6. Ulcerative colitis: -Continue mesalamine  7. Depression: -Continue fluoxetine  8. Hypertension: -Continue labetalol  6. Other medications:  -Continue acyclovir       DVT prophylaxis: Lovenox  Code Status: FULL  Family Communication: Husband at bedside  Disposition Plan: Anticipate IV antibiotics and supplemental O2 and chest physhiotherapy. Consults called: None Admission status: INPATIENT        Medical decision making: Patient seen at 12:15 AM on 08/05/2016.  The patient was discussed with Lenn Sink, [name].  What exists of the patient's chart was reviewed in depth and outside records and summarized above.  Clinical condition: requiring supplemental O2 and IV antibiotics but hemodyanmically stable for medical surgical floor.        Edwin Dada [facility] Pager [phone]    At the time of admission, it appears that the appropriate admission status for this patient is INPATIENT. This is judged to be reasonable and necessary in order to provide the required intensity of service to ensure the patient's safety given the presenting symptoms, physical exam findings, and initial radiographic and laboratory data in the context of their chronic comorbidities.  Together, these circumstances are felt to  place her at high risk for further clinical deterioration threatening life, limb, or organ.   Patient requires inpatient status due to high intensity of service, high risk for further deterioration and high frequency of surveillance required because of this acute illness that poses a threat to life, limb or bodily function.  I certify that at the point of admission it is my clinical judgment that the patient will require inpatient hospital care spanning beyond 2 midnights from the point of admission and that early discharge would result in unnecessary risk of decompensation and readmission or threat to life, limb or bodily function.

## 2016-08-05 NOTE — ED Notes (Addendum)
Pt just took her medication "aubagio" from home b/c we do not carry mx at pharmacy.

## 2016-08-05 NOTE — ED Notes (Signed)
Admitting at bedside 

## 2016-08-05 NOTE — ED Notes (Signed)
Patient placed on 2L O2 [place] d/t SpO2 dropping to 88-90% RA.

## 2016-08-05 NOTE — Progress Notes (Signed)
In and out cath done for urine sample at this time. Patient tolerated well. 220ML of amber colored urine returned. Patient is resting well. Took meds whole in applesauce. No s/s of distress noted. Will continue to monitor and treat per MD orders.

## 2016-08-05 NOTE — Progress Notes (Signed)
RT Note: Patient was ordered chest physiotherapy but a specific method was not specified in the order. Rt started with a flutter valve to perform the therapy and has used it today twice already. Although patient gave a good effort, she required a lot of coaching and her cough is still weak and unproductive although it is congested. Rt spoke with the patient and her husband about trying the chest physiotherapy via chest vest and they agree to try it. Chest vest was brought to bedside and it will be utilized during her next chest physiotherapy session to see if it is more effective for the patient if she is able to tolerate it. Rt will continue to monitor.

## 2016-08-06 ENCOUNTER — Encounter (HOSPITAL_COMMUNITY): Payer: Managed Care, Other (non HMO)

## 2016-08-06 ENCOUNTER — Inpatient Hospital Stay (HOSPITAL_COMMUNITY): Payer: Managed Care, Other (non HMO)

## 2016-08-06 DIAGNOSIS — R4789 Other speech disturbances: Secondary | ICD-10-CM

## 2016-08-06 DIAGNOSIS — R29898 Other symptoms and signs involving the musculoskeletal system: Secondary | ICD-10-CM

## 2016-08-06 LAB — VAS US CAROTID
LCCAPSYS: 125 cm/s
LEFT ECA DIAS: -17 cm/s
LEFT VERTEBRAL DIAS: 22 cm/s
LICADSYS: -94 cm/s
Left CCA dist dias: -30 cm/s
Left CCA dist sys: -73 cm/s
Left CCA prox dias: 35 cm/s
Left ICA dist dias: -38 cm/s
Left ICA prox dias: -28 cm/s
Left ICA prox sys: -71 cm/s
RCCADSYS: -119 cm/s
RCCAPDIAS: 29 cm/s
RIGHT ECA DIAS: -14 cm/s
RIGHT VERTEBRAL DIAS: 22 cm/s
Right CCA prox sys: 93 cm/s

## 2016-08-06 LAB — LIPID PANEL
CHOLESTEROL: 178 mg/dL (ref 0–200)
HDL: 35 mg/dL — ABNORMAL LOW (ref >40)
LDL Cholesterol: 106 mg/dL — ABNORMAL HIGH (ref 0–99)
TRIGLYCERIDES: 184 mg/dL — AB (ref <150)
Total CHOL/HDL Ratio: 5.1 RATIO
VLDL: 37 mg/dL (ref 0–40)

## 2016-08-06 LAB — MAGNESIUM: Magnesium: 2.2 mg/dL (ref 1.7–2.4)

## 2016-08-06 LAB — PHOSPHORUS: Phosphorus: 2.4 mg/dL — ABNORMAL LOW (ref 2.5–4.6)

## 2016-08-06 MED ORDER — ATORVASTATIN CALCIUM 20 MG PO TABS
20.0000 mg | ORAL_TABLET | Freq: Every day | ORAL | Status: DC
  Administered 2016-08-06 – 2016-08-18 (×13): 20 mg via ORAL
  Filled 2016-08-06 (×13): qty 1

## 2016-08-06 MED ORDER — WHITE PETROLATUM GEL
Status: AC
  Administered 2016-08-06: 18:00:00
  Filled 2016-08-06: qty 1

## 2016-08-06 MED ORDER — K PHOS MONO-SOD PHOS DI & MONO 155-852-130 MG PO TABS
500.0000 mg | ORAL_TABLET | Freq: Three times a day (TID) | ORAL | Status: AC
  Administered 2016-08-06 – 2016-08-07 (×6): 500 mg via ORAL
  Filled 2016-08-06 (×7): qty 2

## 2016-08-06 NOTE — Progress Notes (Signed)
Subjective: No changes  Exam: Vitals:   08/05/16 2141 08/06/16 0539  BP: 138/73 (!) 111/50  Pulse: 93 84  Resp: 20 19  Temp: 98.3 F (36.8 C) 98.7 F (37.1 C)   Gen: In bed, NAD Resp: non-labored breathing, no acute distress Abd: soft, nt  Neuro: MS: awake, alert.  JO:ACZYS, EOMI Motor: 2/5 Right arm internal rotation, flexion,0/5 extension 1/5 grip Sensory:intact to LT  Impression: 50 yo F with longstanding MS and punctate lesion in the left hemisphere concerning for stroke vs tiny demyelinating plaque. She has noticed some mild worsening of right arm weakness, difficult to assess if this is due to generalized worsening due to infection, or due to the lesion on MRI. With active ongoing infection, and symptoms only mildly worse(per husband) than baseline, I would not favor treating with steroids at this time. Given this, no need to get C-spine MRI currently. There is a possibility too that this represents infarct given that it does not enhance, and I do think that she would be at risk for this as well. I think that it would be reasonable to start a statin and asa, perform echo and cartids, though the diagnosis of stroke is by no means certain.   Recommendations: 1) ASA 76m daily 2) echo, cartoids(please call if embolic source found on echo, or if > 50% stenosis of the left ICA) 3) statin for LDL > 100 4) PT,OT 5) NO further inpatient recommendations at this time, please call with further questions or concerns.   MRoland Rack MD [facility] [phone] If 7pm- 7am, please page neurology on call as listed in [place]

## 2016-08-06 NOTE — Progress Notes (Signed)
RT went to see patient to do CPT and patient was being taking to ultrasound.

## 2016-08-06 NOTE — Progress Notes (Signed)
PROGRESS NOTE                                                                                                                                                                                                             Patient Demographics:    Vanessa Short, is a 50 y.o. female, DOB - November 18, 1965, DJS:[id number]  Admit date - 08/04/2016   Admitting Physician Edwin Dada, MD  Outpatient Primary MD for the patient is [place]  LOS - 1   Chief Complaint  Patient presents with  . Nasal Congestion  . Fatigue       Brief Narrative    50 y.o. female with a past medical history significant for multiple sclerosis with quadruplegia who presents with 2 days cough as well as confusion and slurred speech, Workup significant for CAP  Subjective:    Vanessa Short today has, No headache, No chest pain, No abdominal pain - No Nausea, No further slurred speech, reports cough, feels congested, unable to produce any phlegm.    Assessment  & Plan :    Principal Problem:   Left lower lobe pneumonia ([place]) Active Problems:   Multiple sclerosis (HCC)   Spastic quadriparesis (HCC)   Depression   Hypokalemia   Normocytic anemia   Slurred speech   Ulcerative colitis ([place])   Essential hypertension  Pneumonia:  - Chest x-ray significant for right middle lobe, left lower lobe pneumonia , continue with IV Rocephin and azithromycin . - Follow on Urine culture and sputum cultures and urine strep  antigen    - RT for chest physiotherapy, IS - Supplemental O2 as needed  MS with quadruplegia:  - Continue Aubagio, dantrolene and baclofen - Continue amitriptyline and lamotrigine and Trileptal for dysesthesias - Continue Vesicare  Hypokalemia:  - Mild.Check mag, Orally replace  Anemia:  - Chronic, unchanged.  Slurred speech:  - Suspect a metabolic encephalopathy from pneumonia in setting of MS.  - MRI brain  with abnormal findings in basal ganglia, ischemic versus active demyelination, neurology input greatly appreciated , concern for stroke versus tiny demyelinating plaque , stroke diagnosis is not certain , she will be started on aspirin, Lipitor, will obtain echo, carotid Doppler, PT/OT .  Ulcerative colitis: - Continue mesalamine  Depression: - Continue fluoxetine  Hypertension: - Continue labetalol  Other medications:  -  Continue acyclovir   Code Status : Full  Family Communication  : Husband at bedside  Disposition Plan  : Home when stable  Consults  :  neurology  Procedures  : None  DVT Prophylaxis  :  Lovenox   Lab Results  Component Value Date   PLT 147 (L) 08/04/2016    Antibiotics  :    Anti-infectives    Start     Dose/Rate Route Frequency Ordered Stop   08/06/16 2200  cefTRIAXone (ROCEPHIN) 1 g in dextrose 5 % 50 mL IVPB     1 g 100 mL/hr over 30 Minutes Intravenous Every 24 hours 08/05/16 0251 08/13/16 2159   08/06/16 2200  azithromycin (ZITHROMAX) tablet 500 mg     500 mg Oral Every 24 hours 08/05/16 0251 08/13/16 2159   08/05/16 1000  acyclovir (ZOVIRAX) tablet 400 mg    Comments:  Take every day per patient     400 mg Oral Daily 08/05/16 0251     08/04/16 2300  cefTRIAXone (ROCEPHIN) 1 g in dextrose 5 % 50 mL IVPB     1 g 100 mL/hr over 30 Minutes Intravenous  Once 08/04/16 2252 08/05/16 0019   08/04/16 2300  azithromycin (ZITHROMAX) 500 mg in dextrose 5 % 250 mL IVPB     500 mg 250 mL/hr over 60 Minutes Intravenous  Once 08/04/16 2252 08/05/16 0128        Objective:   Vitals:   08/06/16 0539 08/06/16 0920 08/06/16 0921 08/06/16 1338  BP: (!) 111/50   115/63  Pulse: 84   85  Resp: 19   18  Temp: 98.7 F (37.1 C)   98.6 F (37 C)  TempSrc: Oral   Oral  SpO2: 95% (!) 89% 93% 95%  Weight:      Height:        Wt Readings from Last 3 Encounters:  08/05/16 85 kg (187 lb 4.8 oz)  05/09/16 79.4 kg (175 lb)  11/09/15 79.4 kg (175 lb)      Intake/Output Summary (Last 24 hours) at 08/06/16 1510 Last data filed at 08/05/16 1532  Gross per 24 hour  Intake              220 ml  Output                0 ml  Net              220 ml     Physical Exam  Awake Alert, Oriented X 3,  Supple Neck,No JVD,  Symmetrical Chest wall movement, Diminished air movement bilaterally, poor inspiratory effort. RRR,No Gallops,Rubs or new Murmurs, No Parasternal Heave +ve B.Sounds, Abd Soft, No tenderness,  No rebound - guarding or rigidity. No Cyanosis, Clubbing or edema, No new Rash or bruise      Data Review:    CBC  Recent Labs Lab 08/04/16 1731 08/04/16 1807  WBC 12.2*  --   HGB 10.2* 10.9*  HCT 32.8* 32.0*  PLT 147*  --   MCV 101.5*  --   MCH 31.6  --   MCHC 31.1  --   RDW 15.1  --   LYMPHSABS 0.9  --   MONOABS 0.8  --   EOSABS 0.0  --   BASOSABS 0.0  --     Chemistries   Recent Labs Lab 08/04/16 1731 08/04/16 1807 08/06/16 0416  NA 138 137  --   K 3.0* 3.3*  --   CL 105 104  --  CO2 23  --   --   GLUCOSE 135* 133*  --   BUN 16 21*  --   CREATININE 0.98 0.80  --   CALCIUM 8.4*  --   --   MG  --   --  2.2  AST 28  --   --   ALT 24  --   --   ALKPHOS 67  --   --   BILITOT 0.6  --   --    ------------------------------------------------------------------------------------------------------------------  Recent Labs  08/06/16 0416  CHOL 178  HDL 35*  LDLCALC 106*  TRIG 184*  CHOLHDL 5.1    No results found for: HGBA1C ------------------------------------------------------------------------------------------------------------------ No results for input(s): TSH, T4TOTAL, T3FREE, THYROIDAB in the last 72 hours.  Invalid input(s): FREET3 ------------------------------------------------------------------------------------------------------------------ No results for input(s): VITAMINB12, FOLATE, FERRITIN, TIBC, IRON, RETICCTPCT in the last 72 hours.  Coagulation profile  Recent Labs Lab  08/04/16 1731  INR 1.06    No results for input(s): DDIMER in the last 72 hours.  Cardiac Enzymes No results for input(s): CKMB, TROPONINI, MYOGLOBIN in the last 168 hours.  Invalid input(s): CK ------------------------------------------------------------------------------------------------------------------ No results found for: BNP  Inpatient Medications  Scheduled Meds: . acyclovir  400 mg Oral Daily  . amitriptyline  75 mg Oral QHS  . aspirin  81 mg Oral Daily  . atorvastatin  20 mg Oral q1800  . azithromycin  500 mg Oral Q24H  . baclofen  5 mg Oral TID  . cefTRIAXone (ROCEPHIN)  IV  1 g Intravenous Q24H  . dantrolene  50 mg Oral TID AC & HS  . darifenacin  15 mg Oral Daily  . enoxaparin (LOVENOX) injection  40 mg Subcutaneous Daily  . FLUoxetine  40 mg Oral Daily  . guaiFENesin  600 mg Oral BID  . labetalol  100 mg Oral Daily  . lamoTRIgine  200 mg Oral TID  . mesalamine  1,000 mg Oral TID  . OXcarbazepine  150 mg Oral TID  . pantoprazole  80 mg Oral Daily  . phosphorus  500 mg Oral TID  . Teriflunomide  1 tablet Oral Daily   Continuous Infusions: PRN Meds:.acetaminophen **OR** acetaminophen, ondansetron **OR** ondansetron (ZOFRAN) IV  Micro Results No results found for this or any previous visit (from the past 240 hour(s)).  Radiology Reports Dg Chest 2 View  Result Date: 08/04/2016 CLINICAL DATA:  Hypoxia with cough EXAM: CHEST  2 VIEW COMPARISON:  05/30/2013 FINDINGS: Right lung is grossly clear. Airspace opacity is present within the left lower lobe and lingula consistent with pneumonia. Small left pleural effusion. Stable mild cardiomegaly. There is mild central vascular congestion. No pneumothorax. Scoliosis of the lower spine. IMPRESSION: 1. Moderate left mid and lower lung infiltrates with small left pleural effusion. 2. Mild cardiomegaly with central vascular congestion Electronically Signed   By: Donavan Foil M.D.   On: 08/04/2016 22:29   Ct Head Wo  Contrast  Result Date: 08/04/2016 CLINICAL DATA:  Slurred speech and lightheadedness overnight. EXAM: CT HEAD WITHOUT CONTRAST TECHNIQUE: Contiguous axial images were obtained from the base of the skull through the vertex without intravenous contrast. COMPARISON:  01/24/2015 FINDINGS: Brain: There is no intracranial hemorrhage, mass or evidence of acute infarction. There is moderate generalized atrophy. There is moderate chronic microvascular ischemic change. There is no significant extra-axial fluid collection. No acute intracranial findings are evident. Vascular: No hyperdense vessel or unexpected calcification. Skull: Normal. Negative for fracture or focal lesion. Sinuses/Orbits: No acute finding. Other: None. IMPRESSION:  No acute intracranial findings. There is moderate generalized atrophy and chronic appearing white matter hypodensities which likely represent small vessel ischemic disease. Electronically Signed   By: Andreas Newport M.D.   On: 08/04/2016 18:22   Mr Brain Wo Contrast  Result Date: 08/05/2016 CLINICAL DATA:  Altered mental status EXAM: MRI HEAD WITHOUT CONTRAST TECHNIQUE: Multiplanar, multiecho pulse sequences of the brain and surrounding structures were obtained without intravenous contrast. COMPARISON:  Head CT 08/04/2016 Brain MRI 01/24/2015 FINDINGS: Brain: Punctate focus of high signal on the diffusion-weighted images at the posterior aspect of the right basal ganglia is unchanged compared to 01/24/2015. There is an additional punctate focus of diffusion restriction in the posterior aspect of the left basal ganglia There is severe white matter disease with confluent lesions in the periventricular white matter, oriented perpendicularly to the long axis of the lateral ventricles. There is atrophy of the corpus callosum. There are numerous low T1 weighted signal lesions. No mass lesion or midline shift. No hydrocephalus or extra-axial fluid collection. There is a left middle cranial  fossa arachnoid cyst. 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 Vascular: Major intracranial arterial and venous sinus flow voids are preserved. No evidence of chronic microhemorrhage or amyloid angiopathy. Skull and upper cervical spine: The visualized skull base, calvarium, upper cervical spine and extracranial soft tissues are normal. Sinuses/Orbits: No fluid levels or advanced mucosal thickening. No mastoid effusion. Normal orbits. IMPRESSION: 1. Severe white matter disease in a pattern consistent with multiple sclerosis. Unchanged distribution of T2 hyperintense lesions. 2. Punctate focus of diffusion restriction at the posterior aspect of the left basal ganglia. These may indicate a tiny area of acute ischemia; however, active demyelinating MS plaques may uncommonly show diffusion restriction. Contrast administration might be helpful for differentiation, if necessary. Electronically Signed   By: Ulyses Jarred M.D.   On: 08/05/2016 03:27   Mr Brain W Contrast  Result Date: 08/05/2016 CLINICAL DATA:  49 year old female with history of multiple sclerosis and quadriplegia presenting with confusion and slurred speech 2 days ago. Cough with patient noted to be hypoxic on room air. Subsequent encounter. EXAM: MRI HEAD WITH CONTRAST TECHNIQUE: Multiplanar, multiecho pulse sequences of the brain and surrounding structures were obtained with intravenous contrast. CONTRAST:  43m MULTIHANCE GADOBENATE DIMEGLUMINE 529 MG/ML IV SOLN COMPARISON:  None. FINDINGS: Brain: Multiple white matter lesions consistent with demyelinating plaques none of which demonstrate enhancement as can be seen with active area of demyelination. No associated mass effect as can be seen with progressive multifocal leukoencephalopathy. Punctate region of restricted motion posterior limb of the right internal capsule may represent a small acute infarct or tiny area of active demyelination. Global  atrophy. Anterior left middle cranial fossa arachnoid cyst. Vascular: Major intracranial vascular structures are patent. Skull and upper cervical spine: No acute abnormality. Sinuses/Orbits: No abnormal enhancement of the optic nerves. Other: Partially empty sella incidentally noted. IMPRESSION: Multiple white matter lesions consistent with demyelinating plaques none of which demonstrate enhancement as can be seen with active area of demyelination. No associated mass effect as can be seen with progressive multifocal leukoencephalopathy. Punctate region of restricted motion posterior limb of the right internal capsule may represent a tiny acute infarct although it is still possible this represents a tiny area of active demyelination which does not enhance. Electronically Signed   By: SGenia DelM.D.   On: 08/05/2016 17:46      Raegan Sipp M.D on 08/06/2016 at 3:10 PM  Between 7am  to 7pm - Pager - [phone]  After 7pm go to [email or web address] - password [facility]  [facility] -  Office  [phone]

## 2016-08-06 NOTE — Progress Notes (Signed)
RT went to do CPT on patient and she wanted to eat breakfast.

## 2016-08-06 NOTE — Progress Notes (Signed)
VASCULAR LAB PRELIMINARY  PRELIMINARY  PRELIMINARY  PRELIMINARY  Carotid duplex completed.    Preliminary report: No significant ICA stenosis.  Vertebral artery flow is antegrade.   Ceili Boshers, RVT 08/06/2016, 12:08 PM

## 2016-08-06 NOTE — Progress Notes (Signed)
Patient back from ultrasound. CPT performed.

## 2016-08-07 ENCOUNTER — Inpatient Hospital Stay (HOSPITAL_COMMUNITY): Payer: Managed Care, Other (non HMO)

## 2016-08-07 DIAGNOSIS — I10 Essential (primary) hypertension: Secondary | ICD-10-CM

## 2016-08-07 DIAGNOSIS — J189 Pneumonia, unspecified organism: Secondary | ICD-10-CM

## 2016-08-07 DIAGNOSIS — J181 Lobar pneumonia, unspecified organism: Secondary | ICD-10-CM

## 2016-08-07 DIAGNOSIS — J9601 Acute respiratory failure with hypoxia: Secondary | ICD-10-CM

## 2016-08-07 DIAGNOSIS — K5190 Ulcerative colitis, unspecified, without complications: Secondary | ICD-10-CM

## 2016-08-07 LAB — BLOOD GAS, ARTERIAL
ACID-BASE DEFICIT: 0.3 mmol/L (ref 0.0–2.0)
BICARBONATE: 23.5 mmol/L (ref 20.0–28.0)
Drawn by: 280981
O2 Content: 6 L/min
O2 Saturation: 92.3 %
PCO2 ART: 36.6 mmHg (ref 32.0–48.0)
PH ART: 7.424 (ref 7.350–7.450)
PO2 ART: 75.6 mmHg — AB (ref 83.0–108.0)
Patient temperature: 98.6

## 2016-08-07 LAB — BASIC METABOLIC PANEL
ANION GAP: 9 (ref 5–15)
BUN: 13 mg/dL (ref 6–20)
CALCIUM: 8.2 mg/dL — AB (ref 8.9–10.3)
CO2: 24 mmol/L (ref 22–32)
Chloride: 105 mmol/L (ref 101–111)
Creatinine, Ser: 0.86 mg/dL (ref 0.44–1.00)
GFR calc Af Amer: >60 mL/min (ref >60)
GLUCOSE: 94 mg/dL (ref 65–99)
Potassium: 3.4 mmol/L — ABNORMAL LOW (ref 3.5–5.1)
SODIUM: 138 mmol/L (ref 135–145)

## 2016-08-07 MED ORDER — ALBUTEROL SULFATE (2.5 MG/3ML) 0.083% IN NEBU
2.5000 mg | INHALATION_SOLUTION | Freq: Four times a day (QID) | RESPIRATORY_TRACT | Status: DC
  Administered 2016-08-07 – 2016-08-13 (×19): 2.5 mg via RESPIRATORY_TRACT
  Filled 2016-08-07 (×21): qty 3

## 2016-08-07 MED ORDER — ALBUTEROL SULFATE (2.5 MG/3ML) 0.083% IN NEBU
INHALATION_SOLUTION | RESPIRATORY_TRACT | Status: AC
  Administered 2016-08-07: 2.5 mg
  Filled 2016-08-07: qty 3

## 2016-08-07 MED ORDER — FUROSEMIDE 10 MG/ML IJ SOLN
40.0000 mg | Freq: Once | INTRAMUSCULAR | Status: AC
  Administered 2016-08-07: 40 mg via INTRAVENOUS

## 2016-08-07 MED ORDER — FUROSEMIDE 10 MG/ML IJ SOLN
INTRAMUSCULAR | Status: AC
  Administered 2016-08-07: 40 mg via INTRAVENOUS
  Filled 2016-08-07: qty 4

## 2016-08-07 MED ORDER — FUROSEMIDE 10 MG/ML IJ SOLN
40.0000 mg | Freq: Two times a day (BID) | INTRAMUSCULAR | Status: DC
  Administered 2016-08-07: 40 mg via INTRAVENOUS
  Filled 2016-08-07: qty 4

## 2016-08-07 MED ORDER — GUAIFENESIN ER 600 MG PO TB12
1200.0000 mg | ORAL_TABLET | Freq: Two times a day (BID) | ORAL | Status: DC
  Administered 2016-08-07 – 2016-08-19 (×22): 1200 mg via ORAL
  Filled 2016-08-07 (×23): qty 2

## 2016-08-07 MED ORDER — PANTOPRAZOLE SODIUM 40 MG PO TBEC
40.0000 mg | DELAYED_RELEASE_TABLET | Freq: Two times a day (BID) | ORAL | Status: DC
  Administered 2016-08-08 – 2016-08-19 (×22): 40 mg via ORAL
  Filled 2016-08-07 (×22): qty 1

## 2016-08-07 MED ORDER — BISOPROLOL FUMARATE 5 MG PO TABS
5.0000 mg | ORAL_TABLET | Freq: Every day | ORAL | Status: DC
  Administered 2016-08-07 – 2016-08-19 (×13): 5 mg via ORAL
  Filled 2016-08-07 (×14): qty 1

## 2016-08-07 MED ORDER — LORAZEPAM 2 MG/ML IJ SOLN
1.0000 mg | Freq: Once | INTRAMUSCULAR | Status: AC
  Administered 2016-08-07: 1 mg via INTRAVENOUS
  Filled 2016-08-07: qty 1

## 2016-08-07 MED ORDER — SALINE SPRAY 0.65 % NA SOLN
1.0000 | NASAL | Status: DC | PRN
  Filled 2016-08-07 (×2): qty 44

## 2016-08-07 NOTE — Progress Notes (Signed)
PROGRESS NOTE                                                                                                                                                                                                             Patient Demographics:    Vanessa Short, is a 50 y.o. female, DOB - [birth year], RVU:[id number]  Admit date - 08/04/2016   Admitting Physician Edwin Dada, MD  Outpatient Primary MD for the patient is [facility]   Chief Complaint  Patient presents with  . Nasal Congestion  . Fatigue       Brief Narrative    50 y.o. female with a past medical history significant for multiple sclerosis with quadruplegia who presents with 2 days cough as well as confusion and slurred speech, Workup significant for CAP  Subjective:    Vanessa Short today has, No headache, No chest pain, No abdominal pain - No Nausea, No further slurred speech, reports cough, feels congested, unable to produce any phlegm.    Assessment  & Plan :    Principal Problem:   Left lower lobe pneumonia ([place]) Active Problems:   Multiple sclerosis (HCC)   Spastic quadriparesis (HCC)   Depression   Hypokalemia   Normocytic anemia   Slurred speech   Ulcerative colitis ([place])   Essential hypertension   Acute respiratory failure with hypoxia (HCC)   CAP (community acquired pneumonia)  Pneumonia:  - Chest x-ray significant for right middle lobe, left lower lobe pneumonia , continue with IV Rocephin and azithromycin . - Follow on sputum cultures,  urine strep  antigen   negative - RT for chest physiotherapy, IS, And flutter valve - Supplemental O2 as needed - Pulmonary input greatly appreciated, continue with current antibiotics, continue with dialysis/flutter/vest, as well started on SABA.  MS with quadruplegia:  - Continue Aubagio, dantrolene and baclofen - Continue amitriptyline and lamotrigine and Trileptal for  dysesthesias - Continue Vesicare  Hypokalemia/hypophosphatemia - Repleted   Anemia:  - Chronic, unchanged.  Slurred speech:  - Suspect a metabolic encephalopathy from pneumonia in setting of MS.  - MRI brain with abnormal findings in basal ganglia, ischemic versus active demyelination, neurology input greatly appreciated , concern for stroke versus tiny demyelinating plaque , stroke diagnosis is not certain ,  started on aspirin, Lipitor,  will obtain echo, carotid Doppler with no significant ICA stenosis, PT/OT .  Ulcerative colitis: - Continue mesalamine  Depression: - Continue fluoxetine  Hypertension: - Continue labetalol  Other medications:  - Continue acyclovir   Code Status : Full  Family Communication  : Husband at bedside  Disposition Plan  : Home when stable  Consults  :  neurology  Procedures  : None  DVT Prophylaxis  :  Lovenox   Lab Results  Component Value Date   PLT 147 (L) 08/04/2016    Antibiotics  :    Anti-infectives    Start     Dose/Rate Route Frequency Ordered Stop   08/06/16 2200  cefTRIAXone (ROCEPHIN) 1 g in dextrose 5 % 50 mL IVPB     1 g 100 mL/hr over 30 Minutes Intravenous Every 24 hours 08/05/16 0251 08/13/16 2159   08/06/16 2200  azithromycin (ZITHROMAX) tablet 500 mg     500 mg Oral Every 24 hours 08/05/16 0251 08/13/16 2159   08/05/16 1000  acyclovir (ZOVIRAX) tablet 400 mg    Comments:  Take every day per patient     400 mg Oral Daily 08/05/16 0251     08/04/16 2300  cefTRIAXone (ROCEPHIN) 1 g in dextrose 5 % 50 mL IVPB     1 g 100 mL/hr over 30 Minutes Intravenous  Once 08/04/16 2252 08/05/16 0019   08/04/16 2300  azithromycin (ZITHROMAX) 500 mg in dextrose 5 % 250 mL IVPB     500 mg 250 mL/hr over 60 Minutes Intravenous  Once 08/04/16 2252 08/05/16 0128        Objective:   Vitals:   08/06/16 1338 08/06/16 2149 08/07/16 0445 08/07/16 1151  BP: 115/63 (!) 168/84 (!) 165/80   Pulse: 85 95 86   Resp: 18 17 17      Temp: 98.6 F (37 C) (!) 100.9 F (38.3 C) 98.3 F (36.8 C)   TempSrc: Oral Axillary Oral   SpO2: 95% 95% 94% 94%  Weight:      Height:        Wt Readings from Last 3 Encounters:  08/05/16 85 kg (187 lb 4.8 oz)  05/09/16 79.4 kg (175 lb)  11/09/15 79.4 kg (175 lb)     Intake/Output Summary (Last 24 hours) at 08/07/16 1538 Last data filed at 08/07/16 0400  Gross per 24 hour  Intake              650 ml  Output                0 ml  Net              650 ml     Physical Exam  Awake Alert, Oriented X 3,  Supple Neck,No JVD,  Symmetrical Chest wall movement, Diminished air movement bilaterally. RRR,No Gallops,Rubs or new Murmurs, No Parasternal Heave +ve B.Sounds, Abd Soft, No tenderness,  No rebound - guarding or rigidity. No Cyanosis, Clubbing or edema, No new Rash or bruise      Data Review:    CBC  Recent Labs Lab 08/04/16 1731 08/04/16 1807  WBC 12.2*  --   HGB 10.2* 10.9*  HCT 32.8* 32.0*  PLT 147*  --   MCV 101.5*  --   MCH 31.6  --   MCHC 31.1  --   RDW 15.1  --   LYMPHSABS 0.9  --   MONOABS 0.8  --   EOSABS 0.0  --   BASOSABS 0.0  --  Chemistries   Recent Labs Lab 08/04/16 1731 08/04/16 1807 08/06/16 0416 08/07/16 0355  NA 138 137  --  138  K 3.0* 3.3*  --  3.4*  CL 105 104  --  105  CO2 23  --   --  24  GLUCOSE 135* 133*  --  94  BUN 16 21*  --  13  CREATININE 0.98 0.80  --  0.86  CALCIUM 8.4*  --   --  8.2*  MG  --   --  2.2  --   AST 28  --   --   --   ALT 24  --   --   --   ALKPHOS 67  --   --   --   BILITOT 0.6  --   --   --    ------------------------------------------------------------------------------------------------------------------  Recent Labs  08/06/16 0416  CHOL 178  HDL 35*  LDLCALC 106*  TRIG 184*  CHOLHDL 5.1    No results found for: HGBA1C ------------------------------------------------------------------------------------------------------------------ No results for input(s): TSH, T4TOTAL,  T3FREE, THYROIDAB in the last 72 hours.  Invalid input(s): FREET3 ------------------------------------------------------------------------------------------------------------------ No results for input(s): VITAMINB12, FOLATE, FERRITIN, TIBC, IRON, RETICCTPCT in the last 72 hours.  Coagulation profile  Recent Labs Lab 08/04/16 1731  INR 1.06    No results for input(s): DDIMER in the last 72 hours.  Cardiac Enzymes No results for input(s): CKMB, TROPONINI, MYOGLOBIN in the last 168 hours.  Invalid input(s): CK ------------------------------------------------------------------------------------------------------------------ No results found for: BNP  Inpatient Medications  Scheduled Meds: . acyclovir  400 mg Oral Daily  . albuterol  2.5 mg Nebulization QID  . amitriptyline  75 mg Oral QHS  . aspirin  81 mg Oral Daily  . atorvastatin  20 mg Oral q1800  . azithromycin  500 mg Oral Q24H  . baclofen  5 mg Oral TID  . bisoprolol  5 mg Oral Daily  . cefTRIAXone (ROCEPHIN)  IV  1 g Intravenous Q24H  . dantrolene  50 mg Oral TID AC & HS  . darifenacin  15 mg Oral Daily  . enoxaparin (LOVENOX) injection  40 mg Subcutaneous Daily  . FLUoxetine  40 mg Oral Daily  . guaiFENesin  1,200 mg Oral BID  . lamoTRIgine  200 mg Oral TID  . mesalamine  1,000 mg Oral TID  . OXcarbazepine  150 mg Oral TID  . [START ON 08/08/2016] pantoprazole  40 mg Oral BID AC  . phosphorus  500 mg Oral TID  . Teriflunomide  1 tablet Oral Daily   Continuous Infusions: PRN Meds:.acetaminophen **OR** acetaminophen, ondansetron **OR** ondansetron (ZOFRAN) IV, sodium chloride  Micro Results No results found for this or any previous visit (from the past 240 hour(s)).  Radiology Reports Dg Chest 2 View  Result Date: 08/04/2016 CLINICAL DATA:  Hypoxia with cough EXAM: CHEST  2 VIEW COMPARISON:  05/30/2013 FINDINGS: Right lung is grossly clear. Airspace opacity is present within the left lower lobe and lingula  consistent with pneumonia. Small left pleural effusion. Stable mild cardiomegaly. There is mild central vascular congestion. No pneumothorax. Scoliosis of the lower spine. IMPRESSION: 1. Moderate left mid and lower lung infiltrates with small left pleural effusion. 2. Mild cardiomegaly with central vascular congestion Electronically Signed   By: Donavan Foil M.D.   On: 08/04/2016 22:29   Ct Head Wo Contrast  Result Date: 08/04/2016 CLINICAL DATA:  Slurred speech and lightheadedness overnight. EXAM: CT HEAD WITHOUT CONTRAST TECHNIQUE: Contiguous axial images were obtained from the  base of the skull through the vertex without intravenous contrast. COMPARISON:  01/24/2015 FINDINGS: Brain: There is no intracranial hemorrhage, mass or evidence of acute infarction. There is moderate generalized atrophy. There is moderate chronic microvascular ischemic change. There is no significant extra-axial fluid collection. No acute intracranial findings are evident. Vascular: No hyperdense vessel or unexpected calcification. Skull: Normal. Negative for fracture or focal lesion. Sinuses/Orbits: No acute finding. Other: None. IMPRESSION: No acute intracranial findings. There is moderate generalized atrophy and chronic appearing white matter hypodensities which likely represent small vessel ischemic disease. Electronically Signed   By: Andreas Newport M.D.   On: 08/04/2016 18:22   Mr Brain Wo Contrast  Result Date: 08/05/2016 CLINICAL DATA:  Altered mental status EXAM: MRI HEAD WITHOUT CONTRAST TECHNIQUE: Multiplanar, multiecho pulse sequences of the brain and surrounding structures were obtained without intravenous contrast. COMPARISON:  Head CT 08/04/2016 Brain MRI 01/24/2015 FINDINGS: Brain: Punctate focus of high signal on the diffusion-weighted images at the posterior aspect of the right basal ganglia is unchanged compared to 01/24/2015. There is an additional punctate focus of diffusion restriction in the posterior  aspect of the left basal ganglia There is severe white matter disease with confluent lesions in the periventricular white matter, oriented perpendicularly to the long axis of the lateral ventricles. There is atrophy of the corpus callosum. There are numerous low T1 weighted signal lesions. No mass lesion or midline shift. No hydrocephalus or extra-axial fluid collection. There is a left middle cranial fossa arachnoid cyst. 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 Vascular: Major intracranial arterial and venous sinus flow voids are preserved. No evidence of chronic microhemorrhage or amyloid angiopathy. Skull and upper cervical spine: The visualized skull base, calvarium, upper cervical spine and extracranial soft tissues are normal. Sinuses/Orbits: No fluid levels or advanced mucosal thickening. No mastoid effusion. Normal orbits. IMPRESSION: 1. Severe white matter disease in a pattern consistent with multiple sclerosis. Unchanged distribution of T2 hyperintense lesions. 2. Punctate focus of diffusion restriction at the posterior aspect of the left basal ganglia. These may indicate a tiny area of acute ischemia; however, active demyelinating MS plaques may uncommonly show diffusion restriction. Contrast administration might be helpful for differentiation, if necessary. Electronically Signed   By: Ulyses Jarred M.D.   On: 08/05/2016 03:27   Mr Brain W Contrast  Result Date: 08/05/2016 CLINICAL DATA:  50 year old female with history of multiple sclerosis and quadriplegia presenting with confusion and slurred speech 2 days ago. Cough with patient noted to be hypoxic on room air. Subsequent encounter. EXAM: MRI HEAD WITH CONTRAST TECHNIQUE: Multiplanar, multiecho pulse sequences of the brain and surrounding structures were obtained with intravenous contrast. CONTRAST:  73m MULTIHANCE GADOBENATE DIMEGLUMINE 529 MG/ML IV SOLN COMPARISON:  None. FINDINGS: Brain:  Multiple white matter lesions consistent with demyelinating plaques none of which demonstrate enhancement as can be seen with active area of demyelination. No associated mass effect as can be seen with progressive multifocal leukoencephalopathy. Punctate region of restricted motion posterior limb of the right internal capsule may represent a small acute infarct or tiny area of active demyelination. Global atrophy. Anterior left middle cranial fossa arachnoid cyst. Vascular: Major intracranial vascular structures are patent. Skull and upper cervical spine: No acute abnormality. Sinuses/Orbits: No abnormal enhancement of the optic nerves. Other: Partially empty sella incidentally noted. IMPRESSION: Multiple white matter lesions consistent with demyelinating plaques none of which demonstrate enhancement as can be seen with active area of demyelination. No associated mass effect  as can be seen with progressive multifocal leukoencephalopathy. Punctate region of restricted motion posterior limb of the right internal capsule may represent a tiny acute infarct although it is still possible this represents a tiny area of active demyelination which does not enhance. Electronically Signed   By: Genia Del M.D.   On: 08/05/2016 17:46   Dg Chest Port 1 View  Result Date: 08/07/2016 CLINICAL DATA:  Congestion and cough. EXAM: PORTABLE CHEST 1 VIEW COMPARISON:  08/04/2016 FINDINGS: Lungs are adequately inflated with persistent airspace opacification over the left mid to lower lung suggesting pneumonia. No definite effusion. Right lung is clear. Mild stable cardiomegaly. Remainder of the exam is unchanged. IMPRESSION: Persistent airspace process over the left mid to lower lung likely a pneumonia. Electronically Signed   By: Marin Olp M.D.   On: 08/07/2016 09:01      Darice Vicario M.D on 08/07/2016 at 3:38 PM  Between 7am to 7pm - Pager - [phone]  After 7pm go to [email or web address] - password [facility]  [facility] -  Office  [phone]

## 2016-08-07 NOTE — Consult Note (Signed)
PULMONARY / CRITICAL CARE MEDICINE   Name: Vanessa Short MRN: [id number] DOB: 1966-07-06    ADMISSION DATE:  08/04/2016 CONSULTATION DATE:  08/07/16  REFERRING MD:  [facility]  CHIEF COMPLAINT:  cough  HISTORY OF PRESENT ILLNESS:   59 yowm never smoker with MS > quadraparesis  and barely able to squeeze R hand/internally rotate R arm with h/o chronic rhinitis x years much worse x sev weeks PTA then severe cough/ congestion > unable to cough up anything > admitted with LLLpna and not improving on approp abx for CAP so PCCM service consulted am 12/25  Husband reports many sick contacts on recent trip to [place]   Never has been seen by ENT/ allergy/pulmonary/ never had any swallowing issues with MS   No obvious day to day or daytime variability or assoc productions  excess/ purulent sputum or mucus plugs or hemoptysis or cp or chest tightness, subjective wheeze or overt sinus or hb symptoms. No other unusual exp hx or h/o childhood pna/ asthma or knowledge of premature birth.  Sleeping ok without nocturnal  or early am exacerbation  of respiratory  c/o's or need for noct saba. Also denies any obvious fluctuation of symptoms with weather or environmental changes or other aggravating or alleviating factors except as outlined above   Current Medications, Allergies, Complete Past Medical History, Past Surgical History, Family History, and Social History were reviewed in [employer] record.  ROS  The following are not active complaints unless bolded sore throat, dysphagia, dental problems, itching, sneezing,  nasal congestion or excess/ purulent secretions, ear ache,   fever, chills, sweats, unintended wt loss, classically pleuritic or exertional cp,  orthopnea pnd or leg swelling, presyncope, palpitations, abdominal pain, anorexia, nausea, vomiting, diarrhea  or change in bowel or bladder habits, change in stools or urine, dysuria,hematuria,  rash, arthralgias, visual complaints,  headache, numbness, weakness or ataxia or problems with walking or coordination,  change in mood/affect or memory.           PAST MEDICAL HISTORY :  She  has a past medical history of Headache; Hypertension; Kidney stones; Movement disorder; Multiple sclerosis ([place]); Neuropathy ([place]); Ulcerative colitis ([place]); and Vision abnormalities.  PAST SURGICAL HISTORY: She  has a past surgical history that includes Kidney stone surgery.  Allergies  Allergen Reactions  . Penicillins Rash    Has patient had a PCN reaction causing immediate rash, facial/tongue/throat swelling, SOB or lightheadedness with hypotension: NO Has patient had a PCN reaction causing severe rash involving mucus membranes or skin necrosis: NO Has patient had a PCN reaction that required hospitalization NO Has patient had a PCN reaction occurring within the last 10 years:NO If all of the above answers are "NO", then may proceed with Cephalosporin use.    No current facility-administered medications on file prior to encounter.    Current Outpatient Prescriptions on File Prior to Encounter  Medication Sig  . acyclovir (ZOVIRAX) 400 MG tablet Take 400 mg by mouth daily. Take every day per patient  . amitriptyline (ELAVIL) 75 MG tablet Take 1 tablet (75 mg total) by mouth at bedtime.  Marland Kitchen b complex vitamins tablet Take by mouth.  . baclofen (LIORESAL) 10 MG tablet One po up to 4 times a day (Patient taking differently: Take 5 mg by mouth 3 (three) times daily. One po up to 4 times a day)  . cholecalciferol (VITAMIN D) 1000 UNITS tablet Take 1,000 Units by mouth daily.  . dantrolene (DANTRIUM) 50 MG capsule TAKE  1 CAPSULE 4 TIMES DAILY  . FLUoxetine (PROZAC) 40 MG capsule Take 1 capsule (40 mg total) by mouth daily.  Marland Kitchen FOLIC ACID PO Take by mouth.  . labetalol (NORMODYNE) 100 MG tablet Take 100 mg by mouth daily.  Marland Kitchen lamoTRIgine (LAMICTAL) 200 MG tablet Take 1 tablet (200 mg total) by mouth 3 (three) times daily.  . mesalamine  (PENTASA) 500 MG CR capsule Take 1,000 mg by mouth 3 (three) times daily.  Marland Kitchen omeprazole (PRILOSEC) 40 MG capsule Take 40 mg by mouth 2 (two) times daily.  . OXcarbazepine (TRILEPTAL) 150 MG tablet Take 1 tablet (150 mg total) by mouth 3 (three) times daily.  . solifenacin (VESICARE) 10 MG tablet Take 10 mg by mouth daily.  . Teriflunomide (AUBAGIO) 14 MG TABS Take 1 tablet by mouth daily.    FAMILY HISTORY:  Her indicated that her mother is deceased. She indicated that her father is deceased.    SOCIAL HISTORY: She  reports that she has never smoked. She has never used smokeless tobacco. She reports that she does not drink alcohol or use drugs.     SUBJECTIVE:  Obese chronically ill wf nad on  2lpm   VITAL SIGNS: BP (!) 165/80 (BP Location: Left Arm)   Pulse 86   Temp 98.3 F (36.8 C) (Oral)   Resp 17   Ht 5' 3"  (1.6 m)   Wt 187 lb 4.8 oz (85 kg)   SpO2 94%   BMI 33.18 kg/m   HEMODYNAMICS:    VENTILATOR SETTINGS:    INTAKE / OUTPUT: I/O last 3 completed shifts: In: 650 [P.O.:600; IV Piggyback:50] Out: -   PHYSICAL EXAMINATION: General:  debilitated obese wf sitting up 45 degrees in bed with rattly congested sounding cough  Wt Readings from Last 3 Encounters:  08/05/16 187 lb 4.8 oz (85 kg)  05/09/16 175 lb (79.4 kg)  11/09/15 175 lb (79.4 kg)    Vital signs reviewed  Pt alert, approp   No jvd Orophararynx clear Neck supple Lungs with a few scattered exp > insp rhonchi bilaterally L > R RRR no s3 or or sign murmur Abd obese with limited insp excursion, some use of exp muscles on coughing Extr warm with  Trace diffuse edema / no clubbing noted Neuro quadraparesis with some R hand grip and int rot R arm, otherwise no voluntary movement      LABS:  BMET  Recent Labs Lab 08/04/16 1731 08/04/16 1807 08/07/16 0355  NA 138 137 138  K 3.0* 3.3* 3.4*  CL 105 104 105  CO2 23  --  24  BUN 16 21* 13  CREATININE 0.98 0.80 0.86  GLUCOSE 135* 133* 94     Electrolytes  Recent Labs Lab 08/04/16 1731 08/06/16 0416 08/07/16 0355  CALCIUM 8.4*  --  8.2*  MG  --  2.2  --   PHOS  --  2.4*  --     CBC  Recent Labs Lab 08/04/16 1731 08/04/16 1807  WBC 12.2*  --   HGB 10.2* 10.9*  HCT 32.8* 32.0*  PLT 147*  --     Coag's  Recent Labs Lab 08/04/16 1731  APTT 31  INR 1.06    Sepsis Markers  Recent Labs Lab 08/04/16 1807  LATICACIDVEN 1.81    ABG No results for input(s): PHART, PCO2ART, PO2ART in the last 168 hours.  Liver Enzymes  Recent Labs Lab 08/04/16 1731  AST 28  ALT 24  ALKPHOS 67  BILITOT  0.6  ALBUMIN 3.3*    Cardiac Enzymes No results for input(s): TROPONINI, PROBNP in the last 168 hours.  Glucose No results for input(s): GLUCAP in the last 168 hours.  Imaging Dg Chest Port 1 View  Result Date: 08/07/2016 CLINICAL DATA:  Congestion and cough. EXAM: PORTABLE CHEST 1 VIEW COMPARISON:  08/04/2016 FINDINGS: Lungs are adequately inflated with persistent airspace opacification over the left mid to lower lung suggesting pneumonia. No definite effusion. Right lung is clear. Mild stable cardiomegaly. Remainder of the exam is unchanged. IMPRESSION: Persistent airspace process over the left mid to lower lung likely a pneumonia. Electronically Signed   By: Marin Olp M.D.   On: 08/07/2016 09:01     STUDIES:     CULTURES: None showing as pending   ANTIBIOTICS: Zmax 12/22>>> Rocephin 12/22 >>>         ASSESSMENT / PLAN:   1) Prob CAP in pt with chronic rhinitis / pnds with multiple sick contacts - rx with roc/zmax approp since this is not, despite her chronic illness,    HCAP (this is her first serious resp flare and hasn't seen much in terms of abx  And has no swallowing problems to make one think of asp   2) very poor cough mechanics due to limited insp/ exp muscle function from MS  - Rx with IS/ flutter/ Vest approp - will add saba 12/25 to see if helps  3) Acute resp hypoxemic  resp failure secondary to 1) and 2)  - insp muscles do not appear to be fatiguing though she's at a small risk (very rare in MS vs MG) - ok to stay on floor/ titrate 02   4) Essential hbp Strongly prefer in the  Setting of poor mc clearance/ ? Need for SABA : Bystolic, the most beta -1  selective Beta blocker available in sample form, with bisoprolol the most selective generic choice  on the market.   - will start bisoprolol   Christinia Gully, MD Pulmonary and [place] [phone] After 5:30 PM or weekends, call [phone]

## 2016-08-07 NOTE — Progress Notes (Signed)
[facility] Progress Note Patient Name: Vanessa Short DOB: [birth year] MRN: [id number]   Date of Service  08/07/2016  HPI/Events of Note  abg 7.42/36/75 o2 sat 92% on 6L [place]  eICU Interventions  No need for biPAP at this time May need to consider high flow [place]     Intervention Category Evaluation Type: Other  Flora Lipps 08/07/2016, 6:51 PM

## 2016-08-07 NOTE — Progress Notes (Signed)
Patient was placed on BiPAP and refused (unable to tolerate) after ten minutes of wearing. MD at bedside when BiPAP refused. Patient also refused chest vest for the evening due to pain. RT will continue to monitor.

## 2016-08-07 NOTE — Progress Notes (Signed)
Patient with worsening hypoxia, on 2 L nasal cannula during the day, currently on 6 L nasal cannula saturating 90%, more lethargic and confused, shallow breathing, tachypneic(been having poor respiratory effort since admission), chest x-ray with worsening bilateral infiltrate, worsening pneumonia versus volume overload, started on IV Lasix, ABG with PCO2 of 36, but giving significant poor respiratory effort will try on BiPAP, is fair to stepdown ,continue with IV diuresis, PCCM consulted to reevaluate patient giving deterioration of medical condition. Phillips Climes MD [phone]

## 2016-08-07 NOTE — Progress Notes (Signed)
eLink Physician-Brief Progress Note Patient Name: Vanessa Short DOB: October 17, 1965 MRN: [id number]   Date of Service  08/07/2016  HPI/Events of Note  50 yo with MS with quadraplegia with progressive hypoxic resp failure with progressive b/l CXR on infiltrates  eICU Interventions  Will get ABg and try BiPAP, transfer to step down     Intervention Category Major Interventions: Respiratory failure - evaluation and management  Velinda Wrobel 08/07/2016, 6:20 PM

## 2016-08-08 ENCOUNTER — Inpatient Hospital Stay (HOSPITAL_COMMUNITY): Payer: Managed Care, Other (non HMO)

## 2016-08-08 DIAGNOSIS — J9601 Acute respiratory failure with hypoxia: Secondary | ICD-10-CM

## 2016-08-08 DIAGNOSIS — E876 Hypokalemia: Secondary | ICD-10-CM

## 2016-08-08 DIAGNOSIS — R079 Chest pain, unspecified: Secondary | ICD-10-CM

## 2016-08-08 DIAGNOSIS — J189 Pneumonia, unspecified organism: Secondary | ICD-10-CM

## 2016-08-08 LAB — ECHOCARDIOGRAM COMPLETE
Ao-asc: 35 cm
CHL CUP MV DEC (S): 187
CHL CUP RV SYS PRESS: 31 mmHg
CHL CUP TV REG PEAK VELOCITY: 240 cm/s
E decel time: 187 msec
EERAT: 9.73
FS: 17 % — AB (ref 28–44)
Height: 63 in
IVS/LV PW RATIO, ED: 1.08
LA ID, A-P, ES: 30 mm
LA diam end sys: 30 mm
LA vol A4C: 62.7 ml
LA vol index: 30.4 mL/m2
LA vol: 60.1 mL
LADIAMINDEX: 1.52 cm/m2
LV TDI E'LATERAL: 8.38
LV TDI E'MEDIAL: 6.85
LV e' LATERAL: 8.38 cm/s
LVEEAVG: 9.73
LVEEMED: 9.73
LVOT VTI: 19.7 cm
LVOT area: 2.27 cm2
LVOTD: 17 mm
LVOTPV: 94.6 cm/s
LVOTSV: 45 mL
MV Peak grad: 3 mmHg
MV pk A vel: 47.5 m/s
MVPKEVEL: 81.5 m/s
PW: 13.1 mm — AB (ref 0.6–1.1)
RV LATERAL S' VELOCITY: 6.74 cm/s
RV TAPSE: 18 mm
TRMAXVEL: 240 cm/s
Weight: 2996.8 oz

## 2016-08-08 LAB — BASIC METABOLIC PANEL
Anion gap: 13 (ref 5–15)
BUN: 11 mg/dL (ref 6–20)
CHLORIDE: 98 mmol/L — AB (ref 101–111)
CO2: 26 mmol/L (ref 22–32)
CREATININE: 0.89 mg/dL (ref 0.44–1.00)
Calcium: 7.8 mg/dL — ABNORMAL LOW (ref 8.9–10.3)
GFR calc Af Amer: >60 mL/min (ref >60)
GFR calc non Af Amer: >60 mL/min (ref >60)
GLUCOSE: 96 mg/dL (ref 65–99)
POTASSIUM: 3.5 mmol/L (ref 3.5–5.1)
SODIUM: 137 mmol/L (ref 135–145)

## 2016-08-08 LAB — HEMOGLOBIN A1C: Mean Plasma Glucose: <74 mg/dL

## 2016-08-08 LAB — CBC
HEMATOCRIT: 31.1 % — AB (ref 36.0–46.0)
HEMOGLOBIN: 10 g/dL — AB (ref 12.0–15.0)
MCH: 32.2 pg (ref 26.0–34.0)
MCHC: 32.2 g/dL (ref 30.0–36.0)
MCV: 100 fL (ref 78.0–100.0)
Platelets: 158 10*3/uL (ref 150–400)
RBC: 3.11 MIL/uL — AB (ref 3.87–5.11)
RDW: 14.4 % (ref 11.5–15.5)
WBC: 5.4 10*3/uL (ref 4.0–10.5)

## 2016-08-08 LAB — PROCALCITONIN: PROCALCITONIN: 0.14 ng/mL

## 2016-08-08 MED ORDER — FUROSEMIDE 10 MG/ML IJ SOLN
60.0000 mg | Freq: Two times a day (BID) | INTRAMUSCULAR | Status: DC
  Administered 2016-08-08 – 2016-08-10 (×4): 60 mg via INTRAVENOUS
  Filled 2016-08-08 (×4): qty 6

## 2016-08-08 MED ORDER — POTASSIUM CHLORIDE CRYS ER 20 MEQ PO TBCR
40.0000 meq | EXTENDED_RELEASE_TABLET | Freq: Once | ORAL | Status: AC
  Administered 2016-08-08: 40 meq via ORAL
  Filled 2016-08-08: qty 2

## 2016-08-08 MED ORDER — TERIFLUNOMIDE 14 MG PO TABS
1.0000 | ORAL_TABLET | Freq: Every day | ORAL | Status: DC
  Administered 2016-08-08 – 2016-08-18 (×11): 1 via ORAL
  Filled 2016-08-08 (×14): qty 1

## 2016-08-08 NOTE — Progress Notes (Signed)
OT Screen Note  Patient Details Name: Vanessa Short MRN: [id number] DOB: [birth year]   Cancelled Treatment:    Reason Eval/Treat Not Completed: OT screened, no needs identified, will sign off Spouse (A) patient with ADLS at baseline  Vonita Moss   OTR/L Pager: [phone] Office: [phone] .  08/08/2016, 1:33 PM

## 2016-08-08 NOTE — Evaluation (Signed)
Physical Therapy Evaluation Patient Details Name: Vanessa Short MRN: [id number] DOB: 1965/10/25 Today's Date: 08/08/2016   History of Present Illness  50 y.o. female with a past medical history significant for multiple sclerosis with quadruplegia who presents with 2 days cough as well as confusion and slurred speech, Workup significant for CAP  Clinical Impression  Patient from home where she is managed with family and aide care at total assist level.  At home she transfers with a standing lift, but spouse states sometimes spasms knock her feet off the platform so utilizes belt to keep feet down.  Feel she will need skilled PT in the acute setting to assess current level of safety with standing lift transfers and to maximize mobility for d/c home.  Spouse states if unable to utilize standing lift may need to check other options (though feels hoyer lift not an option)    Follow Up Recommendations Other (comment) (TBA once able to see if pt able to use standing lift for transfers)    Equipment Recommendations  Other (comment) (TBA (? hoyerlift))    Recommendations for Other Services       Precautions / Restrictions Precautions Precautions: Fall      Mobility  Bed Mobility Overal bed mobility: Needs Assistance Bed Mobility: Rolling Rolling: Total assist         General bed mobility comments: rolling for hygiene due to soiled with BM in bed, pt unable to assist  Transfers                 General transfer comment: deferred OOB due to pt lethargy and reports fatigued after hygiene in bed  Ambulation/Gait                Stairs            Wheelchair Mobility    Modified Rankin (Stroke Patients Only)       Balance                                             Pertinent Vitals/Pain Pain Assessment: 0-10 Pain Score: 2  Pain Location: R knee, (more in buttocks with being cleaned) Pain Descriptors / Indicators: Burning;Sore Pain  Intervention(s): Monitored during session;Limited activity within patient's tolerance;Repositioned    Home Living Family/patient expects to be discharged to:: Private residence Living Arrangements: Spouse/significant other;Children Available Help at Discharge: Personal care attendant;Family Type of Home: House Home Access: Ramped entrance     Home Layout: One level Home Equipment: Shower seat;Hand held shower head;Grab bars - tub/shower;Wheelchair - power;Other (comment) (standing lift both electric and hydrolic)      Prior Function Level of Independence: Needs assistance   Gait / Transfers Assistance Needed: transfers with standing lift on/off w/c to recliner (where she sleeps,) and to 3:1  ADL's / Homemaking Assistance Needed: aide assists daily  Comments: only about an hour pt alone in reliner     Hand Dominance   Dominant Hand: Right    Extremity/Trunk Assessment   Upper Extremity Assessment Upper Extremity Assessment: LUE deficits/detail;RUE deficits/detail RUE Deficits / Details: increased extensor tone R LE more than L and PROM performed for shoulder, elbow and wrist.  no active movements noted. spasms throughout, spouse reports intact sensation LUE Deficits / Details: increased extensor tone R LE more than L and PROM performed for shoulder, elbow and wrist.  no active movements noted.  spasms throughout; sposue reports intact sensation    Lower Extremity Assessment Lower Extremity Assessment: LLE deficits/detail;RLE deficits/detail RLE Deficits / Details: increased tone and spasms L>R, notes some pain L knee and keeps it flexed and externally rotated LLE Deficits / Details: increased tone and spasms L>R, notes some pain L knee and keeps it flexed and externally rotated       Communication   Communication: Expressive difficulties (lethargic and dysarthric)  Cognition Arousal/Alertness: Lethargic Behavior During Therapy: Flat affect Overall Cognitive Status: History  of cognitive impairments - at baseline                      General Comments General comments (skin integrity, edema, etc.): showed spouse the [employer] lift and he reports is similar to what they have at home.  States will need to consider other options once she can no longer use the standing lift    Exercises Other Exercises Other Exercises: PROM performed and tone inhibition techniques to BIlat UE and LE in supine   Assessment/Plan    PT Assessment Patient needs continued PT services  PT Problem List Decreased mobility;Decreased activity tolerance;Decreased knowledge of use of DME;Impaired tone          PT Treatment Interventions DME instruction;Therapeutic activities;Patient/family education;Therapeutic exercise;Functional mobility training    PT Goals (Current goals can be found in the Care Plan section)  Acute Rehab PT Goals Patient Stated Goal: For pt to return home if able to use standing lift PT Goal Formulation: With patient/family Time For Goal Achievement: 08/15/16 Potential to Achieve Goals: Fair    Frequency Min 3X/week   Barriers to discharge        Co-evaluation               End of Session   Activity Tolerance: Patient limited by lethargy;Patient limited by fatigue Patient left: in bed;with family/visitor present;with call bell/phone within reach           Time: 1010-1043 PT Time Calculation (min) (ACUTE ONLY): 33 min   Charges:   PT Evaluation $PT Eval Moderate Complexity: 1 Procedure PT Treatments $Therapeutic Activity: 8-22 mins   PT G Codes:        Reginia Naas 07-Sep-2016, 1:58 PM  Magda Kiel, [place] 07-Sep-2016

## 2016-08-08 NOTE — Progress Notes (Signed)
  Echocardiogram 2D Echocardiogram has been performed.  Vanessa Short 08/08/2016, 3:46 PM

## 2016-08-08 NOTE — Progress Notes (Signed)
PULMONARY / CRITICAL CARE MEDICINE   Name: Vanessa Short MRN: [id number] DOB: Aug 16, 1965    ADMISSION DATE:  08/04/2016 CONSULTATION DATE:  08/07/16  REFERRING MD:  [facility]  CHIEF COMPLAINT:  cough  HISTORY OF PRESENT ILLNESS:   60 yowm never smoker with MS > quadraparesis  and barely able to squeeze R hand/internally rotate R arm with h/o chronic rhinitis x years much worse x sev weeks PTA then severe cough/ congestion > unable to cough up anything > admitted with LLLpna and not improving on approp abx for CAP so PCCM service consulted am 12/25  Husband reports many sick contacts on recent trip to [place]   Never has been seen by ENT/ allergy/pulmonary/ never had any swallowing issues with MS   No obvious day to day or daytime variability or assoc productions  excess/ purulent sputum or mucus plugs or hemoptysis or cp or chest tightness, subjective wheeze or overt sinus or hb symptoms. No other unusual exp hx or h/o childhood pna/ asthma or knowledge of premature birth.  Sleeping ok without nocturnal  or early am exacerbation  of respiratory  c/o's or need for noct saba. Also denies any obvious fluctuation of symptoms with weather or environmental changes or other aggravating or alleviating factors except as outlined above     SUBJECTIVE/INTERVAL:  A little worse last night. O2 demand up to 6L and tried on BiPAP. She was unable to tolerate BiPAP as well as chest PT vest. This AM she is back down to 3L Lane and is subjectively breathing better.   VITAL SIGNS: BP 124/75 (BP Location: Left Arm)   Pulse 85   Temp 98.1 F (36.7 C) (Oral)   Resp (!) 24   Ht 5' 3"  (1.6 m)   Wt 85 kg (187 lb 4.8 oz)   SpO2 96%   BMI 33.18 kg/m     INTAKE / OUTPUT: I/O last 3 completed shifts: In: 410 [P.O.:360; IV Piggyback:50] Out: 1500 [Urine:1500]  General:  Obese female in NAD resting in bed Neuro: Alert, oriented, non-focal. Drowsy HEENT:  [place]/AT, No JVD noted, PERRL Cardiovascular:  RRR, no  MRG Lungs:  Diminished bibasilar. Unlabored on 3L [place] Abdomen:  Soft, non-distended Musculoskeletal:  No acute deformity Skin:  Intact, MMM    LABS:  BMET  Recent Labs Lab 08/04/16 1731 08/04/16 1807 08/07/16 0355 08/08/16 0459  NA 138 137 138 137  K 3.0* 3.3* 3.4* 3.5  CL 105 104 105 98*  CO2 23  --  24 26  BUN 16 21* 13 11  CREATININE 0.98 0.80 0.86 0.89  GLUCOSE 135* 133* 94 96    Electrolytes  Recent Labs Lab 08/04/16 1731 08/06/16 0416 08/07/16 0355 08/08/16 0459  CALCIUM 8.4*  --  8.2* 7.8*  MG  --  2.2  --   --   PHOS  --  2.4*  --   --     CBC  Recent Labs Lab 08/04/16 1731 08/04/16 1807 08/08/16 0459  WBC 12.2*  --  5.4  HGB 10.2* 10.9* 10.0*  HCT 32.8* 32.0* 31.1*  PLT 147*  --  158    Coag's  Recent Labs Lab 08/04/16 1731  APTT 31  INR 1.06    Sepsis Markers  Recent Labs Lab 08/04/16 1807  LATICACIDVEN 1.81    ABG  Recent Labs Lab 08/07/16 1800  PHART 7.424  PCO2ART 36.6  PO2ART 75.6*    Liver Enzymes  Recent Labs Lab 08/04/16 1731  AST 28  ALT  24  ALKPHOS 67  BILITOT 0.6  ALBUMIN 3.3*    Cardiac Enzymes No results for input(s): TROPONINI, PROBNP in the last 168 hours.  Glucose No results for input(s): GLUCAP in the last 168 hours.  Imaging Dg Chest Port 1 View  Result Date: 08/08/2016 CLINICAL DATA:  50 year old female with a history of shortness of breath EXAM: PORTABLE CHEST 1 VIEW COMPARISON:  08/07/2016, 08/04/2016 FINDINGS: Cardiomediastinal silhouette unchanged. Compared to the most recent chest x-ray there is persisting mixed interstitial and airspace opacity of the bilateral mid and lower lobes. Mild interlobular septal thickening. Obscuration of left hemidiaphragm and retrocardiac region. No pneumothorax. IMPRESSION: Persisting bilateral interstitial and airspace opacity, potentially representing a combination of edema, consolidation, atelectasis, and/or small pleural fluid. Signed, Dulcy Fanny.  Earleen Newport, DO [facility] Radiology Electronically Signed   By: Corrie Mckusick D.O.   On: 08/08/2016 07:53   Dg Chest Port 1 View  Result Date: 08/07/2016 CLINICAL DATA:  Acute onset of hypoxia.  Initial encounter. EXAM: PORTABLE CHEST 1 VIEW COMPARISON:  Chest radiograph performed earlier today at 8:14 a.m. FINDINGS: Worsening bibasilar and bilateral central airspace opacification raises concern for worsening pneumonia, though pulmonary edema might have a similar appearance. A small left pleural effusion is noted. No pneumothorax is seen. The cardiomediastinal silhouette is mildly enlarged. No acute osseous abnormalities are seen. IMPRESSION: Worsening bibasilar and bilateral central airspace opacification raises concern for worsening pneumonia, though pulmonary edema might have a similar appearance. Small left pleural effusion noted. Mild cardiomegaly. Electronically Signed   By: Garald Balding M.D.   On: 08/07/2016 18:23     STUDIES:  Echo 12/26 >  CULTURES: None showing as pending   ANTIBIOTICS: Zmax 12/22>>> Rocephin 12/22 >>>   ASSESSMENT / PLAN:  Prob CAP in pt with chronic rhinitis  - Continue CAP coverage as above  Some concern pulmonary edema - Echo pending - Agree with diuresis - May be worth evaluating with CT chest without improvement next 24 hours.  very poor cough mechanics due to limited insp/ exp muscle function from MS  - Pulmonary hygeine with IS/ flutter/ Vest approp (not able to tolerate vest) - Some improvement after adding SABA 12/25  Acute resp hypoxemic resp failure secondary to above - insp muscles do not appear to be fatiguing though she's at a small risk (very rare in MS vs MG) - ok to stay on SDU  Essential hbp  - start bisoprolol 12/25, per primary   Georgann Housekeeper, AGACNP-BC [facility] Pulmonology/Critical Care Pager [phone] or [phone]  08/08/2016 10:36 AM

## 2016-08-08 NOTE — Progress Notes (Signed)
PROGRESS NOTE                                                                                                                                                                                                             Patient Demographics:    Vanessa Short, is a 50 y.o. female, DOB - Jul 18, 1966, JSH:[id number]  Admit date - 08/04/2016   Admitting Physician Edwin Dada, MD  Outpatient Primary MD for the patient is [place] [facility]   Chief Complaint  Patient presents with  . Nasal Congestion  . Fatigue       Brief Narrative    50 y.o. female with a past medical history significant for multiple sclerosis with quadruplegia who presents with 2 days cough as well as confusion and slurred speech, Workup significant for CAP, Transferred to stepdown on 12/25 giving respiratory distress and worsening hypoxia  Subjective:    Vanessa Short today has, No headache, No chest pain, No abdominal pain - No Nausea, No further slurred speech, Transferred to stepdown yesterday given worsening hypoxia  Assessment  & Plan :    Principal Problem:   Left lower lobe pneumonia ([place]) Active Problems:   Multiple sclerosis ([facility])   Spastic quadriparesis ([place])   Depression   Hypokalemia   Normocytic anemia   Slurred speech   Ulcerative colitis ([place])   Essential hypertension   Acute respiratory failure with hypoxia ([place])   CAP (community acquired pneumonia)  Pneumonia:  - Chest x-ray significant for right middle lobe, left lower lobe pneumonia , continue with IV Rocephin and azithromycin . - Follow on sputum cultures, urine strep antigen  negative - RT for chest physiotherapy, IS, And flutter valve Even very poor cough mechanics  Acute hypoxic respiratory failure - Patient on 2 L nasal cannula since admission, increased oxygen demand over last 24 hours, repeat chest x-ray with evidence of worsening bilateral pneumonia  versus volume overload, improvement on repeat chest x-ray today, will increase IV diuresis, continue with current empiric coverage for pulmonary recommendation.  MS with quadruplegia:  - Continue Aubagio, dantrolene and baclofen - Continue amitriptyline and lamotrigine and Trileptal for dysesthesias - Continue Vesicare  Hypokalemia/hypophosphatemia - Repleted   Anemia:  - Chronic, unchanged.  Slurred speech:  - Suspect a metabolic encephalopathy from pneumonia in setting of MS.  -  MRI brain with abnormal findings in basal ganglia, ischemic versus active demyelination, neurology input greatly appreciated , concern for stroke versus tiny demyelinating plaque , stroke diagnosis is not certain ,  started on aspirin, Lipitor, will obtain echo, carotid Doppler with no significant ICA stenosis, PT/OT .  Ulcerative colitis: - Continue mesalamine  Depression: - Continue fluoxetine  Hypertension: - Continue labetalol  Other medications:  - Continue acyclovir   Code Status : Full  Family Communication  : Husband at bedside  Disposition Plan  : Home when stable  Consults  :  neurology  Procedures  : None  DVT Prophylaxis  :  Lovenox   Lab Results  Component Value Date   PLT 158 08/08/2016    Antibiotics  :    Anti-infectives    Start     Dose/Rate Route Frequency Ordered Stop   08/06/16 2200  cefTRIAXone (ROCEPHIN) 1 g in dextrose 5 % 50 mL IVPB     1 g 100 mL/hr over 30 Minutes Intravenous Every 24 hours 08/05/16 0251 08/13/16 2159   08/06/16 2200  azithromycin (ZITHROMAX) tablet 500 mg     500 mg Oral Every 24 hours 08/05/16 0251 08/13/16 2159   08/05/16 1000  acyclovir (ZOVIRAX) tablet 400 mg    Comments:  Take every day per patient     400 mg Oral Daily 08/05/16 0251     08/04/16 2300  cefTRIAXone (ROCEPHIN) 1 g in dextrose 5 % 50 mL IVPB     1 g 100 mL/hr over 30 Minutes Intravenous  Once 08/04/16 2252 08/05/16 0019   08/04/16 2300  azithromycin  (ZITHROMAX) 500 mg in dextrose 5 % 250 mL IVPB     500 mg 250 mL/hr over 60 Minutes Intravenous  Once 08/04/16 2252 08/05/16 0128        Objective:   Vitals:   08/08/16 0742 08/08/16 0755 08/08/16 0807 08/08/16 1225  BP: 124/75   (!) 142/77  Pulse: 78 85  84  Resp: (!) 26 (!) 24  (!) 25  Temp: 98.1 F (36.7 C)   98 F (36.7 C)  TempSrc: Oral   Oral  SpO2: 100% 100% 96% 97%  Weight:      Height:        Wt Readings from Last 3 Encounters:  08/05/16 85 kg (187 lb 4.8 oz)  05/09/16 79.4 kg (175 lb)  11/09/15 79.4 kg (175 lb)     Intake/Output Summary (Last 24 hours) at 08/08/16 1412 Last data filed at 08/08/16 1226  Gross per 24 hour  Intake                0 ml  Output             1750 ml  Net            -1750 ml     Physical Exam  Awake Alert, Oriented X 3,  Supple Neck,No JVD,  Symmetrical Chest wall movement, Diminished air movement bilaterally. RRR,No Gallops,Rubs or new Murmurs, No Parasternal Heave +ve B.Sounds, Abd Soft, No tenderness,  No rebound - guarding or rigidity. No Cyanosis, Clubbing or edema, No new Rash or bruise      Data Review:    CBC  Recent Labs Lab 08/04/16 1731 08/04/16 1807 08/08/16 0459  WBC 12.2*  --  5.4  HGB 10.2* 10.9* 10.0*  HCT 32.8* 32.0* 31.1*  PLT 147*  --  158  MCV 101.5*  --  100.0  MCH 31.6  --  32.2  MCHC 31.1  --  32.2  RDW 15.1  --  14.4  LYMPHSABS 0.9  --   --   MONOABS 0.8  --   --   EOSABS 0.0  --   --   BASOSABS 0.0  --   --     Chemistries   Recent Labs Lab 08/04/16 1731 08/04/16 1807 08/06/16 0416 08/07/16 0355 08/08/16 0459  NA 138 137  --  138 137  K 3.0* 3.3*  --  3.4* 3.5  CL 105 104  --  105 98*  CO2 23  --   --  24 26  GLUCOSE 135* 133*  --  94 96  BUN 16 21*  --  13 11  CREATININE 0.98 0.80  --  0.86 0.89  CALCIUM 8.4*  --   --  8.2* 7.8*  MG  --   --  2.2  --   --   AST 28  --   --   --   --   ALT 24  --   --   --   --   ALKPHOS 67  --   --   --   --   BILITOT 0.6  --   --    --   --    ------------------------------------------------------------------------------------------------------------------  Recent Labs  08/06/16 0416  CHOL 178  HDL 35*  LDLCALC 106*  TRIG 184*  CHOLHDL 5.1    Lab Results  Component Value Date   HGBA1C <4.2 (L) 08/06/2016   ------------------------------------------------------------------------------------------------------------------ No results for input(s): TSH, T4TOTAL, T3FREE, THYROIDAB in the last 72 hours.  Invalid input(s): FREET3 ------------------------------------------------------------------------------------------------------------------ No results for input(s): VITAMINB12, FOLATE, FERRITIN, TIBC, IRON, RETICCTPCT in the last 72 hours.  Coagulation profile  Recent Labs Lab 08/04/16 1731  INR 1.06    No results for input(s): DDIMER in the last 72 hours.  Cardiac Enzymes No results for input(s): CKMB, TROPONINI, MYOGLOBIN in the last 168 hours.  Invalid input(s): CK ------------------------------------------------------------------------------------------------------------------ No results found for: BNP  Inpatient Medications  Scheduled Meds: . acyclovir  400 mg Oral Daily  . albuterol  2.5 mg Nebulization QID  . amitriptyline  75 mg Oral QHS  . aspirin  81 mg Oral Daily  . atorvastatin  20 mg Oral q1800  . azithromycin  500 mg Oral Q24H  . baclofen  5 mg Oral TID  . bisoprolol  5 mg Oral Daily  . cefTRIAXone (ROCEPHIN)  IV  1 g Intravenous Q24H  . dantrolene  50 mg Oral TID AC & HS  . darifenacin  15 mg Oral Daily  . enoxaparin (LOVENOX) injection  40 mg Subcutaneous Daily  . FLUoxetine  40 mg Oral Daily  . furosemide  60 mg Intravenous Q12H  . guaiFENesin  1,200 mg Oral BID  . lamoTRIgine  200 mg Oral TID  . mesalamine  1,000 mg Oral TID  . OXcarbazepine  150 mg Oral TID  . pantoprazole  40 mg Oral BID AC  . Teriflunomide  1 tablet Oral Daily   Continuous Infusions: PRN  Meds:.acetaminophen **OR** acetaminophen, ondansetron **OR** ondansetron (ZOFRAN) IV, sodium chloride  Micro Results No results found for this or any previous visit (from the past 240 hour(s)).  Radiology Reports Dg Chest 2 View  Result Date: 08/04/2016 CLINICAL DATA:  Hypoxia with cough EXAM: CHEST  2 VIEW COMPARISON:  05/30/2013 FINDINGS: Right lung is grossly clear. Airspace opacity is present within the left lower lobe and lingula consistent with pneumonia. Small  left pleural effusion. Stable mild cardiomegaly. There is mild central vascular congestion. No pneumothorax. Scoliosis of the lower spine. IMPRESSION: 1. Moderate left mid and lower lung infiltrates with small left pleural effusion. 2. Mild cardiomegaly with central vascular congestion Electronically Signed   By: Donavan Foil M.D.   On: 08/04/2016 22:29   Ct Head Wo Contrast  Result Date: 08/04/2016 CLINICAL DATA:  Slurred speech and lightheadedness overnight. EXAM: CT HEAD WITHOUT CONTRAST TECHNIQUE: Contiguous axial images were obtained from the base of the skull through the vertex without intravenous contrast. COMPARISON:  01/24/2015 FINDINGS: Brain: There is no intracranial hemorrhage, mass or evidence of acute infarction. There is moderate generalized atrophy. There is moderate chronic microvascular ischemic change. There is no significant extra-axial fluid collection. No acute intracranial findings are evident. Vascular: No hyperdense vessel or unexpected calcification. Skull: Normal. Negative for fracture or focal lesion. Sinuses/Orbits: No acute finding. Other: None. IMPRESSION: No acute intracranial findings. There is moderate generalized atrophy and chronic appearing white matter hypodensities which likely represent small vessel ischemic disease. Electronically Signed   By: Andreas Newport M.D.   On: 08/04/2016 18:22   Mr Brain Wo Contrast  Result Date: 08/05/2016 CLINICAL DATA:  Altered mental status EXAM: MRI HEAD  WITHOUT CONTRAST TECHNIQUE: Multiplanar, multiecho pulse sequences of the brain and surrounding structures were obtained without intravenous contrast. COMPARISON:  Head CT 08/04/2016 Brain MRI 01/24/2015 FINDINGS: Brain: Punctate focus of high signal on the diffusion-weighted images at the posterior aspect of the right basal ganglia is unchanged compared to 01/24/2015. There is an additional punctate focus of diffusion restriction in the posterior aspect of the left basal ganglia There is severe white matter disease with confluent lesions in the periventricular white matter, oriented perpendicularly to the long axis of the lateral ventricles. There is atrophy of the corpus callosum. There are numerous low T1 weighted signal lesions. No mass lesion or midline shift. No hydrocephalus or extra-axial fluid collection. There is a left middle cranial fossa arachnoid cyst. 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 Vascular: Major intracranial arterial and venous sinus flow voids are preserved. No evidence of chronic microhemorrhage or amyloid angiopathy. Skull and upper cervical spine: The visualized skull base, calvarium, upper cervical spine and extracranial soft tissues are normal. Sinuses/Orbits: No fluid levels or advanced mucosal thickening. No mastoid effusion. Normal orbits. IMPRESSION: 1. Severe white matter disease in a pattern consistent with multiple sclerosis. Unchanged distribution of T2 hyperintense lesions. 2. Punctate focus of diffusion restriction at the posterior aspect of the left basal ganglia. These may indicate a tiny area of acute ischemia; however, active demyelinating MS plaques may uncommonly show diffusion restriction. Contrast administration might be helpful for differentiation, if necessary. Electronically Signed   By: Ulyses Jarred M.D.   On: 08/05/2016 03:27   Mr Brain W Contrast  Result Date: 08/05/2016 CLINICAL DATA:  50 year old female  with history of multiple sclerosis and quadriplegia presenting with confusion and slurred speech 2 days ago. Cough with patient noted to be hypoxic on room air. Subsequent encounter. EXAM: MRI HEAD WITH CONTRAST TECHNIQUE: Multiplanar, multiecho pulse sequences of the brain and surrounding structures were obtained with intravenous contrast. CONTRAST:  62m MULTIHANCE GADOBENATE DIMEGLUMINE 529 MG/ML IV SOLN COMPARISON:  None. FINDINGS: Brain: Multiple white matter lesions consistent with demyelinating plaques none of which demonstrate enhancement as can be seen with active area of demyelination. No associated mass effect as can be seen with progressive multifocal leukoencephalopathy. Punctate region of restricted  motion posterior limb of the right internal capsule may represent a small acute infarct or tiny area of active demyelination. Global atrophy. Anterior left middle cranial fossa arachnoid cyst. Vascular: Major intracranial vascular structures are patent. Skull and upper cervical spine: No acute abnormality. Sinuses/Orbits: No abnormal enhancement of the optic nerves. Other: Partially empty sella incidentally noted. IMPRESSION: Multiple white matter lesions consistent with demyelinating plaques none of which demonstrate enhancement as can be seen with active area of demyelination. No associated mass effect as can be seen with progressive multifocal leukoencephalopathy. Punctate region of restricted motion posterior limb of the right internal capsule may represent a tiny acute infarct although it is still possible this represents a tiny area of active demyelination which does not enhance. Electronically Signed   By: Genia Del M.D.   On: 08/05/2016 17:46   Dg Chest Port 1 View  Result Date: 08/08/2016 CLINICAL DATA:  50 year old female with a history of shortness of breath EXAM: PORTABLE CHEST 1 VIEW COMPARISON:  08/07/2016, 08/04/2016 FINDINGS: Cardiomediastinal silhouette unchanged. Compared to the  most recent chest x-ray there is persisting mixed interstitial and airspace opacity of the bilateral mid and lower lobes. Mild interlobular septal thickening. Obscuration of left hemidiaphragm and retrocardiac region. No pneumothorax. IMPRESSION: Persisting bilateral interstitial and airspace opacity, potentially representing a combination of edema, consolidation, atelectasis, and/or small pleural fluid. Signed, Dulcy Fanny. Earleen Newport, DO [facility] Radiology Electronically Signed   By: Corrie Mckusick D.O.   On: 08/08/2016 07:53   Dg Chest Port 1 View  Result Date: 08/07/2016 CLINICAL DATA:  Acute onset of hypoxia.  Initial encounter. EXAM: PORTABLE CHEST 1 VIEW COMPARISON:  Chest radiograph performed earlier today at 8:14 a.m. FINDINGS: Worsening bibasilar and bilateral central airspace opacification raises concern for worsening pneumonia, though pulmonary edema might have a similar appearance. A small left pleural effusion is noted. No pneumothorax is seen. The cardiomediastinal silhouette is mildly enlarged. No acute osseous abnormalities are seen. IMPRESSION: Worsening bibasilar and bilateral central airspace opacification raises concern for worsening pneumonia, though pulmonary edema might have a similar appearance. Small left pleural effusion noted. Mild cardiomegaly. Electronically Signed   By: Garald Balding M.D.   On: 08/07/2016 18:23   Dg Chest Port 1 View  Result Date: 08/07/2016 CLINICAL DATA:  Congestion and cough. EXAM: PORTABLE CHEST 1 VIEW COMPARISON:  08/04/2016 FINDINGS: Lungs are adequately inflated with persistent airspace opacification over the left mid to lower lung suggesting pneumonia. No definite effusion. Right lung is clear. Mild stable cardiomegaly. Remainder of the exam is unchanged. IMPRESSION: Persistent airspace process over the left mid to lower lung likely a pneumonia. Electronically Signed   By: Marin Olp M.D.   On: 08/07/2016  09:01      Larrissa Stivers M.D on 08/08/2016 at 2:12 PM  Between 7am to 7pm - Pager - [phone]  After 7pm go to [email or web address] - password [facility]  [facility] -  Office  [phone]

## 2016-08-09 ENCOUNTER — Inpatient Hospital Stay (HOSPITAL_COMMUNITY): Payer: Managed Care, Other (non HMO)

## 2016-08-09 LAB — RESPIRATORY PANEL BY PCR
Adenovirus: NOT DETECTED
BORDETELLA PERTUSSIS-RVPCR: NOT DETECTED
CORONAVIRUS 229E-RVPPCR: NOT DETECTED
CORONAVIRUS HKU1-RVPPCR: NOT DETECTED
Chlamydophila pneumoniae: NOT DETECTED
Coronavirus NL63: NOT DETECTED
Coronavirus OC43: NOT DETECTED
Influenza A: NOT DETECTED
Influenza B: NOT DETECTED
METAPNEUMOVIRUS-RVPPCR: DETECTED — AB
MYCOPLASMA PNEUMONIAE-RVPPCR: NOT DETECTED
PARAINFLUENZA VIRUS 1-RVPPCR: NOT DETECTED
PARAINFLUENZA VIRUS 2-RVPPCR: NOT DETECTED
Parainfluenza Virus 3: NOT DETECTED
Parainfluenza Virus 4: NOT DETECTED
Respiratory Syncytial Virus: NOT DETECTED
Rhinovirus / Enterovirus: NOT DETECTED

## 2016-08-09 LAB — BASIC METABOLIC PANEL
ANION GAP: 13 (ref 5–15)
BUN: 13 mg/dL (ref 6–20)
CHLORIDE: 95 mmol/L — AB (ref 101–111)
CO2: 30 mmol/L (ref 22–32)
Calcium: 8 mg/dL — ABNORMAL LOW (ref 8.9–10.3)
Creatinine, Ser: 1.02 mg/dL — ABNORMAL HIGH (ref 0.44–1.00)
GFR calc Af Amer: >60 mL/min (ref >60)
GFR calc non Af Amer: >60 mL/min (ref >60)
GLUCOSE: 87 mg/dL (ref 65–99)
POTASSIUM: 3.7 mmol/L (ref 3.5–5.1)
Sodium: 138 mmol/L (ref 135–145)

## 2016-08-09 LAB — MRSA PCR SCREENING: MRSA BY PCR: NEGATIVE

## 2016-08-09 LAB — C DIFFICILE QUICK SCREEN W PCR REFLEX
C DIFFICILE (CDIFF) INTERP: NOT DETECTED
C DIFFICILE (CDIFF) TOXIN: NEGATIVE
C DIFFICLE (CDIFF) ANTIGEN: NEGATIVE

## 2016-08-09 LAB — CBC
HEMATOCRIT: 35.2 % — AB (ref 36.0–46.0)
HEMOGLOBIN: 11.1 g/dL — AB (ref 12.0–15.0)
MCH: 31.3 pg (ref 26.0–34.0)
MCHC: 31.5 g/dL (ref 30.0–36.0)
MCV: 99.2 fL (ref 78.0–100.0)
Platelets: 161 10*3/uL (ref 150–400)
RBC: 3.55 MIL/uL — AB (ref 3.87–5.11)
RDW: 14 % (ref 11.5–15.5)
WBC: 5.4 10*3/uL (ref 4.0–10.5)

## 2016-08-09 LAB — BRAIN NATRIURETIC PEPTIDE: B Natriuretic Peptide: 70.2 pg/mL (ref 0.0–100.0)

## 2016-08-09 MED ORDER — ENSURE ENLIVE PO LIQD
237.0000 mL | Freq: Two times a day (BID) | ORAL | Status: DC
  Administered 2016-08-09 – 2016-08-19 (×13): 237 mL via ORAL

## 2016-08-09 MED ORDER — POTASSIUM CHLORIDE CRYS ER 20 MEQ PO TBCR
40.0000 meq | EXTENDED_RELEASE_TABLET | Freq: Once | ORAL | Status: AC
  Administered 2016-08-09: 40 meq via ORAL
  Filled 2016-08-09: qty 2

## 2016-08-09 MED ORDER — GERHARDT'S BUTT CREAM
TOPICAL_CREAM | Freq: Two times a day (BID) | CUTANEOUS | Status: DC
  Administered 2016-08-09: 1 via TOPICAL
  Administered 2016-08-10 – 2016-08-14 (×8): via TOPICAL
  Administered 2016-08-14: 1 via TOPICAL
  Administered 2016-08-15 – 2016-08-19 (×9): via TOPICAL
  Filled 2016-08-09 (×3): qty 1

## 2016-08-09 NOTE — Care Management Note (Signed)
Case Management Note  Patient Details  Name: Vanessa Short MRN: [id number] Date of Birth: [birth year]  Subjective/Objective:                    Action/Plan: Pt continues on IV lasix and antibiotics. Plan is for home when pt is medically stable. CM following.  Expected Discharge Date:                  Expected Discharge Plan:     In-House Referral:     Discharge planning Services     Post Acute Care Choice:    Choice offered to:     DME Arranged:    DME Agency:     HH Arranged:    HH Agency:     Status of Service:     If discussed at [employer], dates discussed:    Additional Comments:  Pollie Friar, RN 08/09/2016, 9:02 AM

## 2016-08-09 NOTE — Progress Notes (Signed)
PROGRESS NOTE                                                                                                                                                                                                             Patient Demographics:    Vanessa Short, is a 50 y.o. female, DOB - 18-Oct-1965, LGX:[id number]  Admit date - 08/04/2016   Admitting Physician Edwin Dada, MD  Outpatient Primary MD for the patient is [place] - 4   Chief Complaint  Patient presents with  . Nasal Congestion  . Fatigue       Brief Narrative    50 y.o. female with a past medical history significant for multiple sclerosis with quadruplegia who presents with 2 days cough as well as confusion and slurred speech, Workup significant for CAP, Transferred to stepdown on 12/25 giving respiratory distress and worsening hypoxia  Subjective:    Vanessa Short today has, No headache, No chest pain, No abdominal pain - No Nausea, No further slurred speech,Worse cough, but reports unable to produce any sputum  Assessment  & Plan :    Principal Problem:   Left lower lobe pneumonia ([place]) Active Problems:   Multiple sclerosis (HCC)   Spastic quadriparesis (HCC)   Depression   Hypokalemia   Normocytic anemia   Slurred speech   Ulcerative colitis ([place])   Essential hypertension   Acute respiratory failure with hypoxia ([place])   CAP (community acquired pneumonia)  Pneumonia:  - Chest x-ray significant for right middle lobe, left lower lobe pneumonia , continue with IV Rocephin and azithromycin . - Follow on sputum cultures, -  urine strep antigen  negative - Respiratory panel positive for metapneumovirus  virus - RT for chest physiotherapy, IS, And flutter valve Even very poor cough mechanics  Acute hypoxic respiratory failure - Can still requiring oxygen, repeat chest x-ray with worsening pneumonia since admission, respiratory failure most likely due  to pneumonia, and poor cough mechanics , pulmonary input appreciated, continue with her towel, chest PT and nebulizers . - On IV diuresis to see if it's also symptoms, BNP within normal limit, 2-D echo with preserved EF but with grade 2 diastolic dysfunction  MS with quadruplegia:  - Continue Aubagio, dantrolene and baclofen - Continue amitriptyline and lamotrigine and Trileptal for dysesthesias - Continue Vesicare  Hypokalemia/hypophosphatemia - Repleted   Anemia:  - Chronic, unchanged.  Slurred speech:  - Suspect a metabolic encephalopathy from pneumonia in setting  of MS.  - MRI brain with abnormal findings in basal ganglia, ischemic versus active demyelination, neurology input greatly appreciated , concern for stroke versus tiny demyelinating plaque , stroke diagnosis is not certain ,  started on aspirin, Lipitor, 2-D echo with no embolic source, carotid Doppler with no significant ICA stenosis, PT/OT .  Ulcerative colitis: - Continue mesalamine  Depression: - Continue fluoxetine  Hypertension: - Continue labetalol  Other medications:  - Continue acyclovir   Code Status : Full  Family Communication  : Husband at bedside  Disposition Plan  : Home when stable  Consults  :  Neurology, PCCM  Procedures  : None  DVT Prophylaxis  :  Lovenox   Lab Results  Component Value Date   PLT 161 08/09/2016    Antibiotics  :    Anti-infectives    Start     Dose/Rate Route Frequency Ordered Stop   08/06/16 2200  cefTRIAXone (ROCEPHIN) 1 g in dextrose 5 % 50 mL IVPB     1 g 100 mL/hr over 30 Minutes Intravenous Every 24 hours 08/05/16 0251 08/13/16 2159   08/06/16 2200  azithromycin (ZITHROMAX) tablet 500 mg     500 mg Oral Every 24 hours 08/05/16 0251 08/13/16 2159   08/05/16 1000  acyclovir (ZOVIRAX) tablet 400 mg    Comments:  Take every day per patient     400 mg Oral Daily 08/05/16 0251     08/04/16 2300  cefTRIAXone (ROCEPHIN) 1 g in dextrose 5 % 50 mL IVPB       1 g 100 mL/hr over 30 Minutes Intravenous  Once 08/04/16 2252 08/05/16 0019   08/04/16 2300  azithromycin (ZITHROMAX) 500 mg in dextrose 5 % 250 mL IVPB     500 mg 250 mL/hr over 60 Minutes Intravenous  Once 08/04/16 2252 08/05/16 0128        Objective:   Vitals:   08/09/16 0751 08/09/16 1148 08/09/16 1151 08/09/16 1255  BP:   (!) 147/68   Pulse:   83   Resp:   (!) 27   Temp:  98.3 F (36.8 C)    TempSrc:  Oral    SpO2: 100%  96% 94%  Weight:      Height:        Wt Readings from Last 3 Encounters:  08/09/16 85 kg (187 lb 6.3 oz)  05/09/16 79.4 kg (175 lb)  11/09/15 79.4 kg (175 lb)     Intake/Output Summary (Last 24 hours) at 08/09/16 1345 Last data filed at 08/09/16 0741  Gross per 24 hour  Intake              290 ml  Output             2125 ml  Net            -1835 ml     Physical Exam  Awake Alert, Oriented X 3,  Supple Neck,No JVD,  Symmetrical Chest wall movement, Diminished air movement bilaterally. RRR,No Gallops,Rubs or new Murmurs, No Parasternal Heave +ve B.Sounds, Abd Soft, No tenderness,  No rebound - guarding or rigidity. No Cyanosis, Clubbing or edema, No new Rash or bruise      Data Review:    CBC  Recent Labs Lab 08/04/16 1731 08/04/16 1807 08/08/16 0459 08/09/16 0500  WBC 12.2*  --  5.4 5.4  HGB 10.2* 10.9* 10.0* 11.1*  HCT 32.8* 32.0* 31.1* 35.2*  PLT 147*  --  158 161  MCV 101.5*  --  100.0 99.2  MCH 31.6  --  32.2 31.3  MCHC 31.1  --  32.2 31.5  RDW 15.1  --  14.4 14.0  LYMPHSABS 0.9  --   --   --   MONOABS 0.8  --   --   --   EOSABS 0.0  --   --   --   BASOSABS 0.0  --   --   --     Chemistries   Recent Labs Lab 08/04/16 1731 08/04/16 1807 08/06/16 0416 08/07/16 0355 08/08/16 0459 08/09/16 0500  NA 138 137  --  138 137 138  K 3.0* 3.3*  --  3.4* 3.5 3.7  CL 105 104  --  105 98* 95*  CO2 23  --   --  24 26 30   GLUCOSE 135* 133*  --  94 96 87  BUN 16 21*  --  13 11 13   CREATININE 0.98 0.80  --  0.86 0.89  1.02*  CALCIUM 8.4*  --   --  8.2* 7.8* 8.0*  MG  --   --  2.2  --   --   --   AST 28  --   --   --   --   --   ALT 24  --   --   --   --   --   ALKPHOS 67  --   --   --   --   --   BILITOT 0.6  --   --   --   --   --    ------------------------------------------------------------------------------------------------------------------ No results for input(s): CHOL, HDL, LDLCALC, TRIG, CHOLHDL, LDLDIRECT in the last 72 hours.  Lab Results  Component Value Date   HGBA1C <4.2 (L) 08/06/2016   ------------------------------------------------------------------------------------------------------------------ No results for input(s): TSH, T4TOTAL, T3FREE, THYROIDAB in the last 72 hours.  Invalid input(s): FREET3 ------------------------------------------------------------------------------------------------------------------ No results for input(s): VITAMINB12, FOLATE, FERRITIN, TIBC, IRON, RETICCTPCT in the last 72 hours.  Coagulation profile  Recent Labs Lab 08/04/16 1731  INR 1.06    No results for input(s): DDIMER in the last 72 hours.  Cardiac Enzymes No results for input(s): CKMB, TROPONINI, MYOGLOBIN in the last 168 hours.  Invalid input(s): CK ------------------------------------------------------------------------------------------------------------------    Component Value Date/Time   BNP 70.2 08/09/2016 0507    Inpatient Medications  Scheduled Meds: . acyclovir  400 mg Oral Daily  . albuterol  2.5 mg Nebulization QID  . amitriptyline  75 mg Oral QHS  . aspirin  81 mg Oral Daily  . atorvastatin  20 mg Oral q1800  . azithromycin  500 mg Oral Q24H  . baclofen  5 mg Oral TID  . bisoprolol  5 mg Oral Daily  . cefTRIAXone (ROCEPHIN)  IV  1 g Intravenous Q24H  . dantrolene  50 mg Oral TID AC & HS  . darifenacin  15 mg Oral Daily  . enoxaparin (LOVENOX) injection  40 mg Subcutaneous Daily  . feeding supplement (ENSURE ENLIVE)  237 mL Oral BID BM  . FLUoxetine  40  mg Oral Daily  . furosemide  60 mg Intravenous Q12H  . guaiFENesin  1,200 mg Oral BID  . lamoTRIgine  200 mg Oral TID  . mesalamine  1,000 mg Oral TID  . OXcarbazepine  150 mg Oral TID  . pantoprazole  40 mg Oral BID AC  . Teriflunomide  1 tablet Oral Daily   Continuous Infusions: PRN Meds:.acetaminophen **OR** acetaminophen, ondansetron **OR** ondansetron (ZOFRAN) IV, sodium chloride  Micro  Results Recent Results (from the past 240 hour(s))  Respiratory Panel by PCR     Status: Abnormal   Collection Time: 08/08/16  3:12 PM  Result Value Ref Range Status   Adenovirus NOT DETECTED NOT DETECTED Final   Coronavirus 229E NOT DETECTED NOT DETECTED Final   Coronavirus HKU1 NOT DETECTED NOT DETECTED Final   Coronavirus NL63 NOT DETECTED NOT DETECTED Final   Coronavirus OC43 NOT DETECTED NOT DETECTED Final   Metapneumovirus DETECTED (A) NOT DETECTED Final   Rhinovirus / Enterovirus NOT DETECTED NOT DETECTED Final   Influenza A NOT DETECTED NOT DETECTED Final   Influenza B NOT DETECTED NOT DETECTED Final   Parainfluenza Virus 1 NOT DETECTED NOT DETECTED Final   Parainfluenza Virus 2 NOT DETECTED NOT DETECTED Final   Parainfluenza Virus 3 NOT DETECTED NOT DETECTED Final   Parainfluenza Virus 4 NOT DETECTED NOT DETECTED Final   Respiratory Syncytial Virus NOT DETECTED NOT DETECTED Final   Bordetella pertussis NOT DETECTED NOT DETECTED Final   Chlamydophila pneumoniae NOT DETECTED NOT DETECTED Final   Mycoplasma pneumoniae NOT DETECTED NOT DETECTED Final    Radiology Reports Dg Chest 2 View  Result Date: 08/04/2016 CLINICAL DATA:  Hypoxia with cough EXAM: CHEST  2 VIEW COMPARISON:  05/30/2013 FINDINGS: Right lung is grossly clear. Airspace opacity is present within the left lower lobe and lingula consistent with pneumonia. Small left pleural effusion. Stable mild cardiomegaly. There is mild central vascular congestion. No pneumothorax. Scoliosis of the lower spine. IMPRESSION: 1. Moderate  left mid and lower lung infiltrates with small left pleural effusion. 2. Mild cardiomegaly with central vascular congestion Electronically Signed   By: Donavan Foil M.D.   On: 08/04/2016 22:29   Ct Head Wo Contrast  Result Date: 08/04/2016 CLINICAL DATA:  Slurred speech and lightheadedness overnight. EXAM: CT HEAD WITHOUT CONTRAST TECHNIQUE: Contiguous axial images were obtained from the base of the skull through the vertex without intravenous contrast. COMPARISON:  01/24/2015 FINDINGS: Brain: There is no intracranial hemorrhage, mass or evidence of acute infarction. There is moderate generalized atrophy. There is moderate chronic microvascular ischemic change. There is no significant extra-axial fluid collection. No acute intracranial findings are evident. Vascular: No hyperdense vessel or unexpected calcification. Skull: Normal. Negative for fracture or focal lesion. Sinuses/Orbits: No acute finding. Other: None. IMPRESSION: No acute intracranial findings. There is moderate generalized atrophy and chronic appearing white matter hypodensities which likely represent small vessel ischemic disease. Electronically Signed   By: Andreas Newport M.D.   On: 08/04/2016 18:22   Mr Brain Wo Contrast  Result Date: 08/05/2016 CLINICAL DATA:  Altered mental status EXAM: MRI HEAD WITHOUT CONTRAST TECHNIQUE: Multiplanar, multiecho pulse sequences of the brain and surrounding structures were obtained without intravenous contrast. COMPARISON:  Head CT 08/04/2016 Brain MRI 01/24/2015 FINDINGS: Brain: Punctate focus of high signal on the diffusion-weighted images at the posterior aspect of the right basal ganglia is unchanged compared to 01/24/2015. There is an additional punctate focus of diffusion restriction in the posterior aspect of the left basal ganglia There is severe white matter disease with confluent lesions in the periventricular white matter, oriented perpendicularly to the long axis of the lateral  ventricles. There is atrophy of the corpus callosum. There are numerous low T1 weighted signal lesions. No mass lesion or midline shift. No hydrocephalus or extra-axial fluid collection. There is a left middle cranial fossa arachnoid cyst. 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 Vascular: Major intracranial arterial and venous  sinus flow voids are preserved. No evidence of chronic microhemorrhage or amyloid angiopathy. Skull and upper cervical spine: The visualized skull base, calvarium, upper cervical spine and extracranial soft tissues are normal. Sinuses/Orbits: No fluid levels or advanced mucosal thickening. No mastoid effusion. Normal orbits. IMPRESSION: 1. Severe white matter disease in a pattern consistent with multiple sclerosis. Unchanged distribution of T2 hyperintense lesions. 2. Punctate focus of diffusion restriction at the posterior aspect of the left basal ganglia. These may indicate a tiny area of acute ischemia; however, active demyelinating MS plaques may uncommonly show diffusion restriction. Contrast administration might be helpful for differentiation, if necessary. Electronically Signed   By: Ulyses Jarred M.D.   On: 08/05/2016 03:27   Mr Brain W Contrast  Result Date: 08/05/2016 CLINICAL DATA:  50 year old female with history of multiple sclerosis and quadriplegia presenting with confusion and slurred speech 2 days ago. Cough with patient noted to be hypoxic on room air. Subsequent encounter. EXAM: MRI HEAD WITH CONTRAST TECHNIQUE: Multiplanar, multiecho pulse sequences of the brain and surrounding structures were obtained with intravenous contrast. CONTRAST:  86m MULTIHANCE GADOBENATE DIMEGLUMINE 529 MG/ML IV SOLN COMPARISON:  None. FINDINGS: Brain: Multiple white matter lesions consistent with demyelinating plaques none of which demonstrate enhancement as can be seen with active area of demyelination. No associated mass effect as can be  seen with progressive multifocal leukoencephalopathy. Punctate region of restricted motion posterior limb of the right internal capsule may represent a small acute infarct or tiny area of active demyelination. Global atrophy. Anterior left middle cranial fossa arachnoid cyst. Vascular: Major intracranial vascular structures are patent. Skull and upper cervical spine: No acute abnormality. Sinuses/Orbits: No abnormal enhancement of the optic nerves. Other: Partially empty sella incidentally noted. IMPRESSION: Multiple white matter lesions consistent with demyelinating plaques none of which demonstrate enhancement as can be seen with active area of demyelination. No associated mass effect as can be seen with progressive multifocal leukoencephalopathy. Punctate region of restricted motion posterior limb of the right internal capsule may represent a tiny acute infarct although it is still possible this represents a tiny area of active demyelination which does not enhance. Electronically Signed   By: SGenia DelM.D.   On: 08/05/2016 17:46   Dg Chest Port 1 View  Result Date: 08/09/2016 CLINICAL DATA:  Cough and congestion EXAM: PORTABLE CHEST 1 VIEW COMPARISON:  August 08, 2016 FINDINGS: There is persistent airspace consolidation throughout the left mid lower lung zones with small left pleural effusion. The right lung is clear. Heart is mildly enlarged with pulmonary vascularity within normal limits. No adenopathy. No bone lesions. IMPRESSION: Persistent extensive airspace consolidation, likely pneumonia, on the left with small left pleural effusion. Right lung clear. Stable cardiac prominence. Followup PA and lateral chest radiographs recommended in 3-4 weeks following trial of antibiotic therapy to ensure resolution and exclude underlying malignancy. Electronically Signed   By: WLowella GripIII M.D.   On: 08/09/2016 09:05   Dg Chest Port 1 View  Result Date: 08/08/2016 CLINICAL DATA:  Hypoxia EXAM:  PORTABLE CHEST 1 VIEW COMPARISON:  August 08, 2016 study obtained earlier in the day FINDINGS: There is airspace consolidation throughout the mid and lower lung zones. There has been partial clearing of infiltrate from the right base. No new opacity is evident. There is cardiomegaly with pulmonary vascularity within normal limits. No adenopathy. No bone lesions. IMPRESSION: Interval partial clearing from right base. Persistent consolidation left mid and lower lung zones. Stable cardiomegaly. Electronically Signed   By:  Lowella Grip III M.D.   On: 08/08/2016 15:53   Dg Chest Port 1 View  Result Date: 08/08/2016 CLINICAL DATA:  50 year old female with a history of shortness of breath EXAM: PORTABLE CHEST 1 VIEW COMPARISON:  08/07/2016, 08/04/2016 FINDINGS: Cardiomediastinal silhouette unchanged. Compared to the most recent chest x-ray there is persisting mixed interstitial and airspace opacity of the bilateral mid and lower lobes. Mild interlobular septal thickening. Obscuration of left hemidiaphragm and retrocardiac region. No pneumothorax. IMPRESSION: Persisting bilateral interstitial and airspace opacity, potentially representing a combination of edema, consolidation, atelectasis, and/or small pleural fluid. Signed, Dulcy Fanny. Earleen Newport, DO [facility] Electronically Signed   By: Corrie Mckusick D.O.   On: 08/08/2016 07:53   Dg Chest Port 1 View  Result Date: 08/07/2016 CLINICAL DATA:  Acute onset of hypoxia.  Initial encounter. EXAM: PORTABLE CHEST 1 VIEW COMPARISON:  Chest radiograph performed earlier today at 8:14 a.m. FINDINGS: Worsening bibasilar and bilateral central airspace opacification raises concern for worsening pneumonia, though pulmonary edema might have a similar appearance. A small left pleural effusion is noted. No pneumothorax is seen. The cardiomediastinal silhouette is mildly enlarged. No acute osseous abnormalities are seen.  IMPRESSION: Worsening bibasilar and bilateral central airspace opacification raises concern for worsening pneumonia, though pulmonary edema might have a similar appearance. Small left pleural effusion noted. Mild cardiomegaly. Electronically Signed   By: Garald Balding M.D.   On: 08/07/2016 18:23   Dg Chest Port 1 View  Result Date: 08/07/2016 CLINICAL DATA:  Congestion and cough. EXAM: PORTABLE CHEST 1 VIEW COMPARISON:  08/04/2016 FINDINGS: Lungs are adequately inflated with persistent airspace opacification over the left mid to lower lung suggesting pneumonia. No definite effusion. Right lung is clear. Mild stable cardiomegaly. Remainder of the exam is unchanged. IMPRESSION: Persistent airspace process over the left mid to lower lung likely a pneumonia. Electronically Signed   By: Marin Olp M.D.   On: 08/07/2016 09:01      Keyshaun Exley M.D on 08/09/2016 at 1:45 PM  Between 7am to 7pm - Pager - [phone]  After 7pm go to [email or web address] - password [facility]  [facility] -  Office  [phone]

## 2016-08-09 NOTE — Progress Notes (Signed)
Physical Therapy Treatment Patient Details Name: Vanessa Short MRN: [id number] DOB: July 11, 1966 Today's Date: 08/09/2016    History of Present Illness 50 y.o. female with a past medical history significant for multiple sclerosis with quadruplegia who presents with 2 days cough as well as confusion and slurred speech, Workup significant for CAP    PT Comments    Treatment limited to bed mobility as pt needing to use bathroom.  Pt with increasing confusion per RN and family report and needed multiple attempts at orienting to situation and PT.  Pt having difficulty understanding use of bedpan despite cues for use.  Pt even asking PT to send in the PT and again needing re-orienting to this person being PT.  Concern about continued use of standing lift at home as husband indicates at times pt seems to simply hang from UEs and shoulders in standing lift at home.  Husband indicates the only time she is able to stand somewhat is during periods of high tone in LEs allowing weightbearing.  Feel pt will need Hoyer lift for transfers if plan is to D/C to home as standing lifts are not intended for people who are dependent with transfers.  Will continue to follow.    Follow Up Recommendations  SNF     Equipment Recommendations  Other (comment) Harrel Lemon Lift if D/C to home)    Recommendations for Other Services       Precautions / Restrictions Precautions Precautions: Fall Restrictions Weight Bearing Restrictions: No    Mobility  Bed Mobility Overal bed mobility: Needs Assistance Bed Mobility: Rolling Rolling: Total assist;+2 for physical assistance         General bed mobility comments: Rolled to L side with total A x2, which is baseline for pt to be dependent with mobility.    Transfers                    Ambulation/Gait                 Stairs            Wheelchair Mobility    Modified Rankin (Stroke Patients Only)       Balance                                     Cognition Arousal/Alertness: Awake/alert Behavior During Therapy: Flat affect Overall Cognitive Status: Impaired/Different from baseline Area of Impairment: Orientation;Attention;Memory;Following commands;Safety/judgement;Awareness;Problem solving Orientation Level: Disoriented to;Time;Situation Current Attention Level: Sustained Memory: Decreased short-term memory   Safety/Judgement: Decreased awareness of deficits;Decreased awareness of safety Awareness: Intellectual Problem Solving: Slow processing;Requires verbal cues;Requires tactile cues General Comments: pt indicating needing to use the bathroom.  pt placed on bedpan with pt unaware of being able to have a BM on the bedpan.  pt began asking PT about if PT will be taking care of her colonoscopy.  Advised pt to talk to MD as PT would not be involved in colonoscopy.  pt then telling PT to let the PT come in.  Again re-oriented to this person being PT.      Exercises      General Comments        Pertinent Vitals/Pain Pain Assessment: Faces Faces Pain Scale: Hurts a little bit Pain Location: pt grimaces during rolling.   Pain Descriptors / Indicators: Grimacing Pain Intervention(s): Monitored during session;Repositioned    Home Living  Prior Function            PT Goals (current goals can now be found in the care plan section) Acute Rehab PT Goals Patient Stated Goal: For pt to return home if able to use standing lift PT Goal Formulation: With patient/family Time For Goal Achievement: 08/15/16 Potential to Achieve Goals: Fair Progress towards PT goals: Progressing toward goals    Frequency    Min 3X/week      PT Plan Current plan remains appropriate    Co-evaluation             End of Session Equipment Utilized During Treatment: Oxygen Activity Tolerance: Patient limited by fatigue Patient left: in bed (pt unable to use soft touch, RN made aware.   )     Time: 1674-2552 PT Time Calculation (min) (ACUTE ONLY): 19 min  Charges:  $Therapeutic Activity: 8-22 mins                    G CodesCatarina Hartshorn, [place]  [phone] 08/09/2016, 1:45 PM

## 2016-08-09 NOTE — Progress Notes (Signed)
PT Cancellation Note  Patient Details Name: Vanessa Short MRN: [id number] DOB: July 18, 1966   Cancelled Treatment:    Reason Eval/Treat Not Completed: Fatigue/lethargy limiting ability to participate.  Will try another time.     [place], [place] 08/09/2016, 11:02 AM

## 2016-08-09 NOTE — Progress Notes (Signed)
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  and barely able to squeeze R hand/internally rotate R arm with h/o chronic rhinitis x years much worse x sev weeks PTA then severe cough/ congestion > unable to cough up anything > admitted with LLLpna and not improving on approp abx for CAP so PCCM service consulted am 12/25  Husband reports many sick contacts on recent trip to [place]   Never has been seen by ENT/ allergy/pulmonary/ never had any swallowing issues with MS  No obvious day to day or daytime variability or assoc productions  excess/ purulent sputum or mucus plugs or hemoptysis or cp or chest tightness, subjective wheeze or overt sinus or hb symptoms. No other unusual exp hx or h/o childhood pna/ asthma or knowledge of premature birth.  Sleeping ok without nocturnal  or early am exacerbation  of respiratory  c/o's or need for noct saba. Also denies any obvious fluctuation of symptoms with weather or environmental changes or other aggravating or alleviating factors except as outlined above     SUBJECTIVE/INTERVAL:  Negative 2.6L, Remains on 4L [place] with dry cough   VITAL SIGNS: BP 130/70 (BP Location: Left Arm)   Pulse 80   Temp 98.5 F (36.9 C) (Oral)   Resp (!) 23   Ht 5' 3"  (1.6 m)   Wt 85 kg (187 lb 6.3 oz)   SpO2 100%   BMI 33.19 kg/m     INTAKE / OUTPUT: I/O last 3 completed shifts: In: 290 [P.O.:240; IV Piggyback:50] Out: 1829 [HBZJI:9678]  General:  Obese female, on [place], lying in bed  Neuro: Alert, oriented, follows commands  HEENT:  [place]/AT, No JVD noted, PERRL Cardiovascular:  RRR, no MRG, NI S1/S2 Lungs:  Diminished bibasilar. Unlabored on 4L [place] Abdomen:  Soft, non-distended Musculoskeletal:  No acute deformity Skin:  Intact, dry, warm      LABS:  BMET  Recent Labs Lab 08/07/16 0355 08/08/16 0459 08/09/16 0500  NA 138 137 138  K 3.4* 3.5 3.7  CL 105 98* 95*  CO2 24 26 30   BUN 13 11 13   CREATININE 0.86 0.89 1.02*  GLUCOSE 94 96 87    Electrolytes  Recent Labs Lab 08/06/16 0416 08/07/16 0355 08/08/16 0459 08/09/16 0500  CALCIUM  --  8.2* 7.8* 8.0*  MG 2.2  --   --   --   PHOS 2.4*  --   --   --     CBC  Recent Labs Lab 08/04/16 1731 08/04/16 1807 08/08/16 0459 08/09/16 0500  WBC 12.2*  --  5.4 5.4  HGB 10.2* 10.9* 10.0* 11.1*  HCT 32.8* 32.0* 31.1* 35.2*  PLT 147*  --  158 161    Coag's  Recent Labs Lab 08/04/16 1731  APTT 31  INR 1.06    Sepsis Markers  Recent Labs Lab 08/04/16 1807 08/08/16 1532  LATICACIDVEN 1.81  --   PROCALCITON  --  0.14    ABG  Recent Labs Lab 08/07/16 1800  PHART 7.424  PCO2ART 36.6  PO2ART 75.6*    Liver Enzymes  Recent Labs Lab 08/04/16 1731  AST 28  ALT 24  ALKPHOS 67  BILITOT 0.6  ALBUMIN 3.3*    Cardiac Enzymes No results for input(s): TROPONINI, PROBNP in the  last 168 hours.  Glucose No results for input(s): GLUCAP in the last 168 hours.  Imaging Dg Chest Port 1 View  Result Date: 08/09/2016 CLINICAL DATA:  Cough and congestion EXAM: PORTABLE CHEST 1 VIEW COMPARISON:  August 08, 2016 FINDINGS: There is persistent airspace consolidation throughout the left mid lower lung zones with small left pleural effusion. The right lung is clear. Heart is mildly enlarged with pulmonary vascularity within normal limits. No adenopathy. No bone lesions. IMPRESSION: Persistent extensive airspace consolidation, likely pneumonia, on the left with small left pleural effusion. Right lung clear. Stable cardiac prominence. Followup PA and lateral chest radiographs recommended in 3-4 weeks following trial of antibiotic therapy to ensure resolution and exclude underlying malignancy. Electronically Signed   By: Lowella Grip III M.D.   On:  08/09/2016 09:05   Dg Chest Port 1 View  Result Date: 08/08/2016 CLINICAL DATA:  Hypoxia EXAM: PORTABLE CHEST 1 VIEW COMPARISON:  August 08, 2016 study obtained earlier in the day FINDINGS: There is airspace consolidation throughout the mid and lower lung zones. There has been partial clearing of infiltrate from the right base. No new opacity is evident. There is cardiomegaly with pulmonary vascularity within normal limits. No adenopathy. No bone lesions. IMPRESSION: Interval partial clearing from right base. Persistent consolidation left mid and lower lung zones. Stable cardiomegaly. Electronically Signed   By: Lowella Grip III M.D.   On: 08/08/2016 15:53    STUDIES:  Echo 12/26 > EF 55-60%, G2DD  CULTURES:  ANTIBIOTICS: Zmax 12/22>>> Rocephin 12/22 >>>  ASSESSMENT / PLAN:  Acute Hypoxic Respiratory Failure secondary to severe CAP in pt with chronic rhinitis  - Continue Rocephin and Azithromycin  - Trend WBC and fever curve - Maintain saturation >92  - Continue albuterol  - RVP and sputum culture pending   Pulmonary Edema secondary to diastolic heart failure  - Diuresis as BP and renal function tolerate (Currently Lasix 60 mg BID)  - Trend BNP   Respiratory Muscle Weakness secondary to MS - Some improvement after adding SABA 12/25 - Pulmonary hygeine with IS/ flutter/ Vest approp - Continue home MS medications   Rest of medical management per [facility].   Hayden Pedro, AG-ACNP [facility]  Pgr: [phone]  PCCM Pgr: [phone]

## 2016-08-10 ENCOUNTER — Ambulatory Visit: Payer: Managed Care, Other (non HMO) | Admitting: Physical Therapy

## 2016-08-10 ENCOUNTER — Inpatient Hospital Stay (HOSPITAL_COMMUNITY): Payer: Managed Care, Other (non HMO)

## 2016-08-10 DIAGNOSIS — J123 Human metapneumovirus pneumonia: Secondary | ICD-10-CM

## 2016-08-10 LAB — BASIC METABOLIC PANEL
Anion gap: 13 (ref 5–15)
BUN: 16 mg/dL (ref 6–20)
CHLORIDE: 92 mmol/L — AB (ref 101–111)
CO2: 29 mmol/L (ref 22–32)
CREATININE: 0.97 mg/dL (ref 0.44–1.00)
Calcium: 8.3 mg/dL — ABNORMAL LOW (ref 8.9–10.3)
GFR calc non Af Amer: >60 mL/min (ref >60)
Glucose, Bld: 87 mg/dL (ref 65–99)
POTASSIUM: 3.4 mmol/L — AB (ref 3.5–5.1)
Sodium: 134 mmol/L — ABNORMAL LOW (ref 135–145)

## 2016-08-10 LAB — PROCALCITONIN: Procalcitonin: 0.15 ng/mL

## 2016-08-10 MED ORDER — DIPHENOXYLATE-ATROPINE 2.5-0.025 MG PO TABS
2.0000 | ORAL_TABLET | Freq: Four times a day (QID) | ORAL | Status: DC | PRN
  Administered 2016-08-10 – 2016-08-11 (×2): 2 via ORAL
  Filled 2016-08-10 (×2): qty 2

## 2016-08-10 MED ORDER — LORAZEPAM 1 MG PO TABS
1.0000 mg | ORAL_TABLET | Freq: Once | ORAL | Status: DC
  Filled 2016-08-10: qty 1

## 2016-08-10 MED ORDER — LOPERAMIDE HCL 2 MG PO CAPS: 2.0000 mg | ORAL_CAPSULE | ORAL | Status: DC | PRN

## 2016-08-10 MED ORDER — FUROSEMIDE 10 MG/ML IJ SOLN
60.0000 mg | Freq: Three times a day (TID) | INTRAMUSCULAR | Status: DC
  Administered 2016-08-10 – 2016-08-12 (×7): 60 mg via INTRAVENOUS
  Filled 2016-08-10 (×7): qty 6

## 2016-08-10 MED ORDER — POTASSIUM CHLORIDE CRYS ER 20 MEQ PO TBCR
30.0000 meq | EXTENDED_RELEASE_TABLET | Freq: Four times a day (QID) | ORAL | Status: AC
  Administered 2016-08-10 (×2): 30 meq via ORAL
  Filled 2016-08-10 (×2): qty 1

## 2016-08-10 NOTE — Progress Notes (Signed)
PULMONARY / CRITICAL CARE MEDICINE   Name: Vanessa Short MRN: [id number] DOB: [birth year]    ADMISSION DATE:  08/04/2016 CONSULTATION DATE:  08/07/16  REFERRING MD:  [facility]  CHIEF COMPLAINT:  cough  Brief:   50 yo female with progressive rhinitis, cough, and chest congestion from viral pneumonitis.  PMhx of MS with quadriparesis.  SUBJECTIVE/INTERVAL:  Negative 2.7L, Down to 1L Vanessa Short. Feels better. Breathing feels back to normal today.  VITAL SIGNS: BP (!) 151/79 (BP Location: Left Arm)   Pulse 83   Temp 97.6 F (36.4 C) (Oral)   Resp (!) 23   Ht 5' 3"  (1.6 m)   Wt 85 kg (187 lb 6.3 oz)   SpO2 100%   BMI 33.19 kg/m     INTAKE / OUTPUT: I/O last 3 completed shifts: In: 39 [P.O.:480; IV Piggyback:50] Out: 1950 [Urine:1950]  General:  Obese female, on Vanessa Short, lying in bed. Looks much better  Neuro: Alert, oriented, follows commands  HEENT:  Vanessa Short/AT, No JVD noted, PERRL Cardiovascular:  RRR, no MRG, NI S1/S2 Lungs:  Diminished bibasilar. Unlabored on 1L Vanessa Short Abdomen:  Soft, non-distended  Musculoskeletal:  No acute deformity Skin:  Intact, dry, warm     LABS:  BMET  Recent Labs Lab 08/08/16 0459 08/09/16 0500 08/10/16 0438  NA 137 138 134*  K 3.5 3.7 3.4*  CL 98* 95* 92*  CO2 26 30 29   BUN 11 13 16   CREATININE 0.89 1.02* 0.97  GLUCOSE 96 87 87    Electrolytes  Recent Labs Lab 08/06/16 0416  08/08/16 0459 08/09/16 0500 08/10/16 0438  CALCIUM  --   < > 7.8* 8.0* 8.3*  MG 2.2  --   --   --   --   PHOS 2.4*  --   --   --   --   < > = values in this interval not displayed.  CBC  Recent Labs Lab 08/04/16 1731 08/04/16 1807 08/08/16 0459 08/09/16 0500  WBC 12.2*  --  5.4 5.4  HGB 10.2* 10.9* 10.0* 11.1*  HCT 32.8* 32.0* 31.1* 35.2*  PLT 147*  --  158 161    Coag's  Recent Labs Lab 08/04/16 1731  APTT 31  INR 1.06    Sepsis Markers  Recent Labs Lab 08/04/16 1807 08/08/16 1532 08/10/16 0438  LATICACIDVEN 1.81  --   --   PROCALCITON   --  0.14 0.15    ABG  Recent Labs Lab 08/07/16 1800  PHART 7.424  PCO2ART 36.6  PO2ART 75.6*    Liver Enzymes  Recent Labs Lab 08/04/16 1731  AST 28  ALT 24  ALKPHOS 67  BILITOT 0.6  ALBUMIN 3.3*    Cardiac Enzymes No results for input(s): TROPONINI, PROBNP in the last 168 hours.  Glucose No results for input(s): GLUCAP in the last 168 hours.  Imaging Dg Chest Port 1 View  Result Date: 08/10/2016 CLINICAL DATA:  Bilateral pneumonia, cough, congestion EXAM: PORTABLE CHEST 1 VIEW COMPARISON:  08/09/2016 FINDINGS: There is mild bilateral interstitial thickening with significant interval improvement in the left mid and lower lung airspace opacities. There is no pleural effusion or pneumothorax. The heart mediastinum are stable. The osseous structures are unremarkable. IMPRESSION: 1. There is mild bilateral interstitial thickening with significant interval improvement in the left mid and lower lung airspace opacities. Electronically Signed   By: Kathreen Devoid   On: 08/10/2016 07:56    STUDIES:  Echo 12/26 > EF 55-60%, G2DD  CULTURES: RVP > metapneumovirus  Legionella antigen >>>  ANTIBIOTICS: Zmax 12/22>>>12/28 Rocephin 12/22 >>>12/28  ASSESSMENT / PLAN:  Acute Hypoxic Respiratory Failure secondary to severe CAP in pt with chronic rhinitis - Metapneumovirus indicated on RVP 12/27 - DC Rocephin and Azithromycin as has completed 7 day course. Virus indicated. PCT normalized. - Trend WBC and fever curve - Maintain saturation >92%  - Continue albuterol  - Sputum culture pending  Pulmonary Edema secondary to diastolic heart failure  - Diuresis as BP and renal function tolerate. Reasonable to start working back towards home dose.  - Trend BNP   Respiratory Muscle Weakness secondary to MS  - Pulmonary hygeine with IS/ flutter/ Vest - Continue home MS medications   Rest of medical management per [facility].   PCCM will sign off, please call back if further assistance  required  Georgann Housekeeper, AGACNP-BC [facility] Pager [phone] or [phone]  08/10/2016 10:29 AM   Still has cough, but breathing better.  No wheeze.  CXR - improved aeration.  Assessment/plan:  Viral pneumonitis with low procalcitonin. - d/c Abx  Acute on chronic diastolic CHF. - negative fluid balance as tolerated  PCCM will sign off.  Chesley Mires, MD [facility] 08/10/2016, 11:55 AM Pager:  [phone] After 3pm call: [phone]

## 2016-08-10 NOTE — Care Management Note (Signed)
Case Management Note  Patient Details  Name: Vanessa Short MRN: [id number] Date of Birth: [birth year]  Subjective/Objective:   Presents with pna, has flexiseal, patient is a Engineer, building services with MS.  Plan is for SNF, CSW following.                 Action/Plan:   Expected Discharge Date:                  Expected Discharge Plan:  Skilled Nursing Facility  In-House Referral:  Clinical Social Work  Discharge planning Services  CM Consult  Post Acute Care Choice:    Choice offered to:     DME Arranged:    DME Agency:     HH Arranged:    [place] Agency:     Status of Service:  Completed, signed off  If discussed at [employer], dates discussed:    Additional Comments:  Zenon Mayo, RN 08/10/2016, 3:18 PM

## 2016-08-10 NOTE — Progress Notes (Signed)
PROGRESS NOTE                                                                                                                                                                                                             Patient Demographics:    Vanessa Short, is a 50 y.o. female, DOB - [birth year], GGE:[id number]  Admit date - 08/04/2016   Admitting Physician Edwin Dada, MD  Outpatient Primary MD for the patient is [place] - 5   Chief Complaint  Patient presents with  . Nasal Congestion  . Fatigue       Brief Narrative    50 y.o. female with a past medical history significant for multiple sclerosis with quadruplegia who presents with 2 days cough as well as confusion and slurred speech, Workup significant for CAP, Transferred to stepdown on 12/25 giving respiratory distress and worsening hypoxia  Subjective:    Vanessa Short today has, No headache, No chest pain, No abdominal pain - No Nausea, Reports cough, nonproductive, reports dyspnea is improving, continues to have diarrhea.   Assessment  & Plan :    Principal Problem:   Left lower lobe pneumonia ([place]) Active Problems:   Multiple sclerosis (HCC)   Spastic quadriparesis (HCC)   Depression   Hypokalemia   Normocytic anemia   Slurred speech   Ulcerative colitis ([place])   Essential hypertension   Acute respiratory failure with hypoxia (HCC)   CAP (community acquired pneumonia)  Pneumonia:  - Chest x-ray significant for right middle lobe, left lower lobe pneumonia ,Treat total of 7 days of  IV Rocephin and azithromycin . -  urine strep antigen  negative - Respiratory panel positive for metapneumovirus  virus - RT for chest physiotherapy, IS, And flutter valve Even very poor cough mechanics  Acute hypoxic respiratory failure - Can still requiring oxygen, repeat chest x-ray with worsening pneumonia since admission, respiratory failure most likely due to  pneumonia, and poor cough mechanics , pulmonary input appreciated, continue with her towel, chest PT and nebulizers .  Acute diastolic CHF - On IV diuresis , 2-D echo with preserved EF but with grade 2 diastolic dysfunction, will increase IV Lasix to 60 mg 3 times a day  MS with quadruplegia:  - Continue Aubagio, dantrolene and baclofen - Continue amitriptyline and lamotrigine and Trileptal for dysesthesias - Continue Vesicare  Hypokalemia/hypophosphatemia - Repleted   Anemia:  - Chronic, unchanged.  Slurred speech:  - Suspect a metabolic  encephalopathy from pneumonia in setting of MS.  - MRI brain with abnormal findings in basal ganglia, ischemic versus active demyelination, neurology input greatly appreciated , concern for stroke versus tiny demyelinating plaque , stroke diagnosis is not certain ,  started on aspirin, Lipitor, 2-D echo with no embolic source, carotid Doppler with no significant ICA stenosis, PT/OT .  Ulcerative colitis: - Continue mesalamine  Depression: - Continue fluoxetine  Hypertension: - Continue labetalol  Other medications:  - Continue acyclovir  Diarrhea - C. difficile negative, and she has flexes CL, will start on Lomotil  Code Status : Full  Family Communication  : Husband at bedside  Disposition Plan  : SNF in 1-2 days  Consults  :  Neurology, PCCM  Procedures  : None  DVT Prophylaxis  :  Lovenox   Lab Results  Component Value Date   PLT 161 08/09/2016    Antibiotics  :    Anti-infectives    Start     Dose/Rate Route Frequency Ordered Stop   08/06/16 2200  cefTRIAXone (ROCEPHIN) 1 g in dextrose 5 % 50 mL IVPB  Status:  Discontinued     1 g 100 mL/hr over 30 Minutes Intravenous Every 24 hours 08/05/16 0251 08/10/16 1156   08/06/16 2200  azithromycin (ZITHROMAX) tablet 500 mg  Status:  Discontinued     500 mg Oral Every 24 hours 08/05/16 0251 08/10/16 1156   08/05/16 1000  acyclovir (ZOVIRAX) tablet 400 mg    Comments:   Take every day per patient     400 mg Oral Daily 08/05/16 0251     08/04/16 2300  cefTRIAXone (ROCEPHIN) 1 g in dextrose 5 % 50 mL IVPB     1 g 100 mL/hr over 30 Minutes Intravenous  Once 08/04/16 2252 08/05/16 0019   08/04/16 2300  azithromycin (ZITHROMAX) 500 mg in dextrose 5 % 250 mL IVPB     500 mg 250 mL/hr over 60 Minutes Intravenous  Once 08/04/16 2252 08/05/16 0128        Objective:   Vitals:   08/10/16 0742 08/10/16 1250 08/10/16 1346 08/10/16 1449  BP: (!) 151/79 (!) 149/74  (!) 151/86  Pulse: 83 85 86 85  Resp: (!) 23 (!) 23 (!) 24 18  Temp: 97.6 F (36.4 C) 97.9 F (36.6 C)  98.8 F (37.1 C)  TempSrc: Oral Oral  Oral  SpO2: 100% 90% 91% 92%  Weight:    82 kg (180 lb 12.4 oz)  Height:        Wt Readings from Last 3 Encounters:  08/10/16 82 kg (180 lb 12.4 oz)  05/09/16 79.4 kg (175 lb)  11/09/15 79.4 kg (175 lb)     Intake/Output Summary (Last 24 hours) at 08/10/16 1515 Last data filed at 08/10/16 1000  Gross per 24 hour  Intake              477 ml  Output                0 ml  Net              477 ml     Physical Exam  Awake Alert, Oriented X 3,  Supple Neck,No JVD,  Symmetrical Chest wall movement, Diminished air movement bilaterally. RRR,No Gallops,Rubs or new Murmurs, No Parasternal Heave +ve B.Sounds, Abd Soft, No tenderness,  No rebound - guarding or rigidity. No Cyanosis, Clubbing or edema, No new Rash or bruise      Data Review:  CBC  Recent Labs Lab 08/04/16 1731 08/04/16 1807 08/08/16 0459 08/09/16 0500  WBC 12.2*  --  5.4 5.4  HGB 10.2* 10.9* 10.0* 11.1*  HCT 32.8* 32.0* 31.1* 35.2*  PLT 147*  --  158 161  MCV 101.5*  --  100.0 99.2  MCH 31.6  --  32.2 31.3  MCHC 31.1  --  32.2 31.5  RDW 15.1  --  14.4 14.0  LYMPHSABS 0.9  --   --   --   MONOABS 0.8  --   --   --   EOSABS 0.0  --   --   --   BASOSABS 0.0  --   --   --     Chemistries   Recent Labs Lab 08/04/16 1731 08/04/16 1807 08/06/16 0416 08/07/16 0355  08/08/16 0459 08/09/16 0500 08/10/16 0438  NA 138 137  --  138 137 138 134*  K 3.0* 3.3*  --  3.4* 3.5 3.7 3.4*  CL 105 104  --  105 98* 95* 92*  CO2 23  --   --  24 26 30 29   GLUCOSE 135* 133*  --  94 96 87 87  BUN 16 21*  --  13 11 13 16   CREATININE 0.98 0.80  --  0.86 0.89 1.02* 0.97  CALCIUM 8.4*  --   --  8.2* 7.8* 8.0* 8.3*  MG  --   --  2.2  --   --   --   --   AST 28  --   --   --   --   --   --   ALT 24  --   --   --   --   --   --   ALKPHOS 67  --   --   --   --   --   --   BILITOT 0.6  --   --   --   --   --   --    ------------------------------------------------------------------------------------------------------------------ No results for input(s): CHOL, HDL, LDLCALC, TRIG, CHOLHDL, LDLDIRECT in the last 72 hours.  Lab Results  Component Value Date   HGBA1C <4.2 (L) 08/06/2016   ------------------------------------------------------------------------------------------------------------------ No results for input(s): TSH, T4TOTAL, T3FREE, THYROIDAB in the last 72 hours.  Invalid input(s): FREET3 ------------------------------------------------------------------------------------------------------------------ No results for input(s): VITAMINB12, FOLATE, FERRITIN, TIBC, IRON, RETICCTPCT in the last 72 hours.  Coagulation profile  Recent Labs Lab 08/04/16 1731  INR 1.06    No results for input(s): DDIMER in the last 72 hours.  Cardiac Enzymes No results for input(s): CKMB, TROPONINI, MYOGLOBIN in the last 168 hours.  Invalid input(s): CK ------------------------------------------------------------------------------------------------------------------    Component Value Date/Time   BNP 70.2 08/09/2016 0507    Inpatient Medications  Scheduled Meds: . acyclovir  400 mg Oral Daily  . albuterol  2.5 mg Nebulization QID  . amitriptyline  75 mg Oral QHS  . aspirin  81 mg Oral Daily  . atorvastatin  20 mg Oral q1800  . baclofen  5 mg Oral TID  .  bisoprolol  5 mg Oral Daily  . dantrolene  50 mg Oral TID AC & HS  . darifenacin  15 mg Oral Daily  . enoxaparin (LOVENOX) injection  40 mg Subcutaneous Daily  . feeding supplement (ENSURE ENLIVE)  237 mL Oral BID BM  . FLUoxetine  40 mg Oral Daily  . furosemide  60 mg Intravenous Q8H  . Gerhardt's butt cream   Topical BID  . guaiFENesin  1,200 mg Oral BID  . lamoTRIgine  200 mg Oral TID  . LORazepam  1 mg Oral Once  . mesalamine  1,000 mg Oral TID  . OXcarbazepine  150 mg Oral TID  . pantoprazole  40 mg Oral BID AC  . Teriflunomide  1 tablet Oral Daily   Continuous Infusions: PRN Meds:.acetaminophen **OR** acetaminophen, diphenoxylate-atropine, ondansetron **OR** ondansetron (ZOFRAN) IV, sodium chloride  Micro Results Recent Results (from the past 240 hour(s))  Respiratory Panel by PCR     Status: Abnormal   Collection Time: 08/08/16  3:12 PM  Result Value Ref Range Status   Adenovirus NOT DETECTED NOT DETECTED Final   Coronavirus 229E NOT DETECTED NOT DETECTED Final   Coronavirus HKU1 NOT DETECTED NOT DETECTED Final   Coronavirus NL63 NOT DETECTED NOT DETECTED Final   Coronavirus OC43 NOT DETECTED NOT DETECTED Final   Metapneumovirus DETECTED (A) NOT DETECTED Final   Rhinovirus / Enterovirus NOT DETECTED NOT DETECTED Final   Influenza A NOT DETECTED NOT DETECTED Final   Influenza B NOT DETECTED NOT DETECTED Final   Parainfluenza Virus 1 NOT DETECTED NOT DETECTED Final   Parainfluenza Virus 2 NOT DETECTED NOT DETECTED Final   Parainfluenza Virus 3 NOT DETECTED NOT DETECTED Final   Parainfluenza Virus 4 NOT DETECTED NOT DETECTED Final   Respiratory Syncytial Virus NOT DETECTED NOT DETECTED Final   Bordetella pertussis NOT DETECTED NOT DETECTED Final   Chlamydophila pneumoniae NOT DETECTED NOT DETECTED Final   Mycoplasma pneumoniae NOT DETECTED NOT DETECTED Final  MRSA PCR Screening     Status: None   Collection Time: 08/09/16 11:48 AM  Result Value Ref Range Status   MRSA  by PCR NEGATIVE NEGATIVE Final    Comment:        The GeneXpert MRSA Assay (FDA approved for NASAL specimens only), is one component of a comprehensive MRSA colonization surveillance program. It is not intended to diagnose MRSA infection nor to guide or monitor treatment for MRSA infections.   C difficile quick scan w PCR reflex     Status: None   Collection Time: 08/09/16  2:28 PM  Result Value Ref Range Status   C Diff antigen NEGATIVE NEGATIVE Final   C Diff toxin NEGATIVE NEGATIVE Final   C Diff interpretation No C. difficile detected.  Final    Radiology Reports Dg Chest 2 View  Result Date: 08/04/2016 CLINICAL DATA:  Hypoxia with cough EXAM: CHEST  2 VIEW COMPARISON:  05/30/2013 FINDINGS: Right lung is grossly clear. Airspace opacity is present within the left lower lobe and lingula consistent with pneumonia. Small left pleural effusion. Stable mild cardiomegaly. There is mild central vascular congestion. No pneumothorax. Scoliosis of the lower spine. IMPRESSION: 1. Moderate left mid and lower lung infiltrates with small left pleural effusion. 2. Mild cardiomegaly with central vascular congestion Electronically Signed   By: Donavan Foil M.D.   On: 08/04/2016 22:29   Ct Head Wo Contrast  Result Date: 08/04/2016 CLINICAL DATA:  Slurred speech and lightheadedness overnight. EXAM: CT HEAD WITHOUT CONTRAST TECHNIQUE: Contiguous axial images were obtained from the base of the skull through the vertex without intravenous contrast. COMPARISON:  01/24/2015 FINDINGS: Brain: There is no intracranial hemorrhage, mass or evidence of acute infarction. There is moderate generalized atrophy. There is moderate chronic microvascular ischemic change. There is no significant extra-axial fluid collection. No acute intracranial findings are evident. Vascular: No hyperdense vessel or unexpected calcification. Skull: Normal. Negative for fracture or focal lesion. Sinuses/Orbits: No acute finding.  Other:  None. IMPRESSION: No acute intracranial findings. There is moderate generalized atrophy and chronic appearing white matter hypodensities which likely represent small vessel ischemic disease. Electronically Signed   By: Andreas Newport M.D.   On: 08/04/2016 18:22   Mr Brain Wo Contrast  Result Date: 08/05/2016 CLINICAL DATA:  Altered mental status EXAM: MRI HEAD WITHOUT CONTRAST TECHNIQUE: Multiplanar, multiecho pulse sequences of the brain and surrounding structures were obtained without intravenous contrast. COMPARISON:  Head CT 08/04/2016 Brain MRI 01/24/2015 FINDINGS: Brain: Punctate focus of high signal on the diffusion-weighted images at the posterior aspect of the right basal ganglia is unchanged compared to 01/24/2015. There is an additional punctate focus of diffusion restriction in the posterior aspect of the left basal ganglia There is severe white matter disease with confluent lesions in the periventricular white matter, oriented perpendicularly to the long axis of the lateral ventricles. There is atrophy of the corpus callosum. There are numerous low T1 weighted signal lesions. No mass lesion or midline shift. No hydrocephalus or extra-axial fluid collection. There is a left middle cranial fossa arachnoid cyst. 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 Vascular: Major intracranial arterial and venous sinus flow voids are preserved. No evidence of chronic microhemorrhage or amyloid angiopathy. Skull and upper cervical spine: The visualized skull base, calvarium, upper cervical spine and extracranial soft tissues are normal. Sinuses/Orbits: No fluid levels or advanced mucosal thickening. No mastoid effusion. Normal orbits. IMPRESSION: 1. Severe white matter disease in a pattern consistent with multiple sclerosis. Unchanged distribution of T2 hyperintense lesions. 2. Punctate focus of diffusion restriction at the posterior aspect of the left basal  ganglia. These may indicate a tiny area of acute ischemia; however, active demyelinating MS plaques may uncommonly show diffusion restriction. Contrast administration might be helpful for differentiation, if necessary. Electronically Signed   By: Ulyses Jarred M.D.   On: 08/05/2016 03:27   Mr Brain W Contrast  Result Date: 08/05/2016 CLINICAL DATA:  50 year old female with history of multiple sclerosis and quadriplegia presenting with confusion and slurred speech 2 days ago. Cough with patient noted to be hypoxic on room air. Subsequent encounter. EXAM: MRI HEAD WITH CONTRAST TECHNIQUE: Multiplanar, multiecho pulse sequences of the brain and surrounding structures were obtained with intravenous contrast. CONTRAST:  73m MULTIHANCE GADOBENATE DIMEGLUMINE 529 MG/ML IV SOLN COMPARISON:  None. FINDINGS: Brain: Multiple white matter lesions consistent with demyelinating plaques none of which demonstrate enhancement as can be seen with active area of demyelination. No associated mass effect as can be seen with progressive multifocal leukoencephalopathy. Punctate region of restricted motion posterior limb of the right internal capsule may represent a small acute infarct or tiny area of active demyelination. Global atrophy. Anterior left middle cranial fossa arachnoid cyst. Vascular: Major intracranial vascular structures are patent. Skull and upper cervical spine: No acute abnormality. Sinuses/Orbits: No abnormal enhancement of the optic nerves. Other: Partially empty sella incidentally noted. IMPRESSION: Multiple white matter lesions consistent with demyelinating plaques none of which demonstrate enhancement as can be seen with active area of demyelination. No associated mass effect as can be seen with progressive multifocal leukoencephalopathy. Punctate region of restricted motion posterior limb of the right internal capsule may represent a tiny acute infarct although it is still possible this represents a tiny area  of active demyelination which does not enhance. Electronically Signed   By: SGenia DelM.D.   On: 08/05/2016 17:46   Dg Chest Port 1 View  Result Date: 08/10/2016 CLINICAL  DATA:  Bilateral pneumonia, cough, congestion EXAM: PORTABLE CHEST 1 VIEW COMPARISON:  08/09/2016 FINDINGS: There is mild bilateral interstitial thickening with significant interval improvement in the left mid and lower lung airspace opacities. There is no pleural effusion or pneumothorax. The heart mediastinum are stable. The osseous structures are unremarkable. IMPRESSION: 1. There is mild bilateral interstitial thickening with significant interval improvement in the left mid and lower lung airspace opacities. Electronically Signed   By: Kathreen Devoid   On: 08/10/2016 07:56   Dg Chest Port 1 View  Result Date: 08/09/2016 CLINICAL DATA:  Cough and congestion EXAM: PORTABLE CHEST 1 VIEW COMPARISON:  August 08, 2016 FINDINGS: There is persistent airspace consolidation throughout the left mid lower lung zones with small left pleural effusion. The right lung is clear. Heart is mildly enlarged with pulmonary vascularity within normal limits. No adenopathy. No bone lesions. IMPRESSION: Persistent extensive airspace consolidation, likely pneumonia, on the left with small left pleural effusion. Right lung clear. Stable cardiac prominence. Followup PA and lateral chest radiographs recommended in 3-4 weeks following trial of antibiotic therapy to ensure resolution and exclude underlying malignancy. Electronically Signed   By: Lowella Grip III M.D.   On: 08/09/2016 09:05   Dg Chest Port 1 View  Result Date: 08/08/2016 CLINICAL DATA:  Hypoxia EXAM: PORTABLE CHEST 1 VIEW COMPARISON:  August 08, 2016 study obtained earlier in the day FINDINGS: There is airspace consolidation throughout the mid and lower lung zones. There has been partial clearing of infiltrate from the right base. No new opacity is evident. There is cardiomegaly with  pulmonary vascularity within normal limits. No adenopathy. No bone lesions. IMPRESSION: Interval partial clearing from right base. Persistent consolidation left mid and lower lung zones. Stable cardiomegaly. Electronically Signed   By: Lowella Grip III M.D.   On: 08/08/2016 15:53   Dg Chest Port 1 View  Result Date: 08/08/2016 CLINICAL DATA:  50 year old female with a history of shortness of breath EXAM: PORTABLE CHEST 1 VIEW COMPARISON:  08/07/2016, 08/04/2016 FINDINGS: Cardiomediastinal silhouette unchanged. Compared to the most recent chest x-ray there is persisting mixed interstitial and airspace opacity of the bilateral mid and lower lobes. Mild interlobular septal thickening. Obscuration of left hemidiaphragm and retrocardiac region. No pneumothorax. IMPRESSION: Persisting bilateral interstitial and airspace opacity, potentially representing a combination of edema, consolidation, atelectasis, and/or small pleural fluid. Signed, Dulcy Fanny. Earleen Newport, DO [facility] Electronically Signed   By: Corrie Mckusick D.O.   On: 08/08/2016 07:53   Dg Chest Port 1 View  Result Date: 08/07/2016 CLINICAL DATA:  Acute onset of hypoxia.  Initial encounter. EXAM: PORTABLE CHEST 1 VIEW COMPARISON:  Chest radiograph performed earlier today at 8:14 a.m. FINDINGS: Worsening bibasilar and bilateral central airspace opacification raises concern for worsening pneumonia, though pulmonary edema might have a similar appearance. A small left pleural effusion is noted. No pneumothorax is seen. The cardiomediastinal silhouette is mildly enlarged. No acute osseous abnormalities are seen. IMPRESSION: Worsening bibasilar and bilateral central airspace opacification raises concern for worsening pneumonia, though pulmonary edema might have a similar appearance. Small left pleural effusion noted. Mild cardiomegaly. Electronically Signed   By: Garald Balding M.D.   On: 08/07/2016  18:23   Dg Chest Port 1 View  Result Date: 08/07/2016 CLINICAL DATA:  Congestion and cough. EXAM: PORTABLE CHEST 1 VIEW COMPARISON:  08/04/2016 FINDINGS: Lungs are adequately inflated with persistent airspace opacification over the left mid to lower lung suggesting pneumonia. No definite effusion. Right  lung is clear. Mild stable cardiomegaly. Remainder of the exam is unchanged. IMPRESSION: Persistent airspace process over the left mid to lower lung likely a pneumonia. Electronically Signed   By: Marin Olp M.D.   On: 08/07/2016 09:01      Loray Akard M.D on 08/10/2016 at 3:15 PM  Between 7am to 7pm - Pager - [phone]  After 7pm go to [email or web address] - password [facility]  [facility] -  Office  [phone]

## 2016-08-10 NOTE — Progress Notes (Signed)
CPT 1200 delayed due to pt eating lunch.

## 2016-08-11 LAB — BASIC METABOLIC PANEL
Anion gap: 12 (ref 5–15)
BUN: 16 mg/dL (ref 6–20)
CALCIUM: 8.3 mg/dL — AB (ref 8.9–10.3)
CO2: 29 mmol/L (ref 22–32)
CREATININE: 1.07 mg/dL — AB (ref 0.44–1.00)
Chloride: 96 mmol/L — ABNORMAL LOW (ref 101–111)
GFR calc Af Amer: >60 mL/min (ref >60)
GFR, EST NON AFRICAN AMERICAN: 59 mL/min — AB (ref >60)
GLUCOSE: 116 mg/dL — AB (ref 65–99)
Potassium: 3.5 mmol/L (ref 3.5–5.1)
Sodium: 137 mmol/L (ref 135–145)

## 2016-08-11 NOTE — Progress Notes (Addendum)
PROGRESS NOTE                                                                                                                                                                                                             Patient Demographics:    Vanessa Short, is a 50 y.o. female, DOB - 09/18/1965, TRR:[id number]  Admit date - 08/04/2016   Admitting Physician Edwin Dada, MD  Outpatient Primary MD for the patient is [facility]   Chief Complaint  Patient presents with  . Nasal Congestion  . Fatigue       Brief Narrative    50 y.o. female with a past medical history significant for multiple sclerosis with quadruplegia who presents with 2 days cough as well as confusion and slurred speech, Workup significant for CAP, Transferred to stepdown on 12/25 giving respiratory distress and worsening hypoxia, Chest x-ray with worsening bilateral infiltrate, worsening pneumonia versus volume overload, 2-D echo with diastolic dysfunction, respiratory status improved with IV diuresis, respiratory panel significant for metapneumovirus.   Subjective:    [name] today has, No headache, No chest pain, No abdominal pain - No Nausea, Reports cough, nonproductive, reports dyspnea is improving, Diarrhea has improved   Assessment  & Plan :    Principal Problem:   Left lower lobe pneumonia ([place]) Active Problems:   Multiple sclerosis (HCC)   Spastic quadriparesis (HCC)   Depression   Hypokalemia   Normocytic anemia   Slurred speech   Ulcerative colitis ([place])   Essential hypertension   Acute respiratory failure with hypoxia (HCC)   CAP (community acquired pneumonia)  Pneumonia:  - Chest x-ray significant for right middle lobe, left lower lobe pneumonia ,Treat total of 7 days of  IV Rocephin and azithromycin . -  urine strep antigen  negative - Respiratory panel positive for metapneumovirus  virus - RT for chest physiotherapy, IS,  And flutter valve given very poor cough mechanics - Pro calcitonin normalized, afebrile, no leukocytosis  Acute hypoxic respiratory failure - Secondary to pneumonia , chest x-ray with worsening pneumonia since admission, respiratory failure most likely due to pneumonia, and poor cough mechanics , pulmonary input appreciated, continue with her towel, chest PT and nebulizers . - Improving, currently on 0.5 L of cannula, hopefully can be weaned off prior to discharge  Acute diastolic CHF - On IV diuresis , 2-D echo with preserved EF but with grade 2 diastolic dysfunction, respiratory status improving with  IV diuresis, continue with current dose 60 mg IV 3 times a day , can be transitioned to Lasix 40 mg oral twice daily tomorrow. - We'll discharge on Lasix 40 mg oral twice daily  MS with quadruplegia:  - Continue Aubagio, dantrolene and baclofen - Continue amitriptyline and lamotrigine and Trileptal for dysesthesias - Continue Vesicare  Hypokalemia/hypophosphatemia - Repleted   Anemia:  - Chronic, unchanged.  Slurred speech:  - Suspect a metabolic encephalopathy from pneumonia in setting of MS.  - MRI brain with abnormal findings in basal ganglia, ischemic versus active demyelination, neurology input greatly appreciated , concern for stroke versus tiny demyelinating plaque , stroke diagnosis is not certain ,  started on aspirin, Lipitor, 2-D echo with no embolic source, carotid Doppler with no significant ICA stenosis, PT/OT . - We'll discharge on aspirin and Lipitor  Ulcerative colitis: - Continue mesalamine  Depression: - Continue fluoxetine  Hypertension: - Continue labetalol  Other medications:  - Continue acyclovir  Diarrhea - C. difficile negative, and she has flexes CL, improving on Lomotil, will discontinue flexes CL  We'll discontinue Foley catheter today  Code Status : Full  Family Communication  : Family at bedside  Disposition Plan  : SNF in a.m. with  insurance approval obtained  Consults  :  Neurology, PCCM  Procedures  : None  DVT Prophylaxis  :  Lovenox   Lab Results  Component Value Date   PLT 161 08/09/2016    Antibiotics  :    Anti-infectives    Start     Dose/Rate Route Frequency Ordered Stop   08/06/16 2200  cefTRIAXone (ROCEPHIN) 1 g in dextrose 5 % 50 mL IVPB  Status:  Discontinued     1 g 100 mL/hr over 30 Minutes Intravenous Every 24 hours 08/05/16 0251 08/10/16 1156   08/06/16 2200  azithromycin (ZITHROMAX) tablet 500 mg  Status:  Discontinued     500 mg Oral Every 24 hours 08/05/16 0251 08/10/16 1156   08/05/16 1000  acyclovir (ZOVIRAX) tablet 400 mg    Comments:  Take every day per patient     400 mg Oral Daily 08/05/16 0251     08/04/16 2300  cefTRIAXone (ROCEPHIN) 1 g in dextrose 5 % 50 mL IVPB     1 g 100 mL/hr over 30 Minutes Intravenous  Once 08/04/16 2252 08/05/16 0019   08/04/16 2300  azithromycin (ZITHROMAX) 500 mg in dextrose 5 % 250 mL IVPB     500 mg 250 mL/hr over 60 Minutes Intravenous  Once 08/04/16 2252 08/05/16 0128        Objective:   Vitals:   08/11/16 0742 08/11/16 0830 08/11/16 1125 08/11/16 1224  BP: 140/80  (!) 146/74   Pulse: 86 88 79   Resp: 14 17 18    Temp: 97.4 F (36.3 C)  97.6 F (36.4 C)   TempSrc: Oral  Oral   SpO2: 98% 98% 94% 93%  Weight:      Height:        Wt Readings from Last 3 Encounters:  08/11/16 77.8 kg (171 lb 8.3 oz)  05/09/16 79.4 kg (175 lb)  11/09/15 79.4 kg (175 lb)     Intake/Output Summary (Last 24 hours) at 08/11/16 1413 Last data filed at 08/11/16 1324  Gross per 24 hour  Intake              240 ml  Output             2400 ml  Net            -2160 ml     Physical Exam  Awake Alert, Oriented X 3,  Supple Neck,No JVD,  Symmetrical Chest wall movement, Diminished air movement bilaterally. RRR,No Gallops,Rubs or new Murmurs, No Parasternal Heave +ve B.Sounds, Abd Soft, No tenderness,  No rebound - guarding or rigidity. No Cyanosis,  Clubbing or edema, No new Rash or bruise      Data Review:    CBC  Recent Labs Lab 08/04/16 1731 08/04/16 1807 08/08/16 0459 08/09/16 0500  WBC 12.2*  --  5.4 5.4  HGB 10.2* 10.9* 10.0* 11.1*  HCT 32.8* 32.0* 31.1* 35.2*  PLT 147*  --  158 161  MCV 101.5*  --  100.0 99.2  MCH 31.6  --  32.2 31.3  MCHC 31.1  --  32.2 31.5  RDW 15.1  --  14.4 14.0  LYMPHSABS 0.9  --   --   --   MONOABS 0.8  --   --   --   EOSABS 0.0  --   --   --   BASOSABS 0.0  --   --   --     Chemistries   Recent Labs Lab 08/04/16 1731  08/06/16 0416 08/07/16 0355 08/08/16 0459 08/09/16 0500 08/10/16 0438 08/11/16 0528  NA 138  < >  --  138 137 138 134* 137  K 3.0*  < >  --  3.4* 3.5 3.7 3.4* 3.5  CL 105  < >  --  105 98* 95* 92* 96*  CO2 23  --   --  24 26 30 29 29   GLUCOSE 135*  < >  --  94 96 87 87 116*  BUN 16  < >  --  13 11 13 16 16   CREATININE 0.98  < >  --  0.86 0.89 1.02* 0.97 1.07*  CALCIUM 8.4*  --   --  8.2* 7.8* 8.0* 8.3* 8.3*  MG  --   --  2.2  --   --   --   --   --   AST 28  --   --   --   --   --   --   --   ALT 24  --   --   --   --   --   --   --   ALKPHOS 67  --   --   --   --   --   --   --   BILITOT 0.6  --   --   --   --   --   --   --   < > = values in this interval not displayed. ------------------------------------------------------------------------------------------------------------------ No results for input(s): CHOL, HDL, LDLCALC, TRIG, CHOLHDL, LDLDIRECT in the last 72 hours.  Lab Results  Component Value Date   HGBA1C <4.2 (L) 08/06/2016   ------------------------------------------------------------------------------------------------------------------ No results for input(s): TSH, T4TOTAL, T3FREE, THYROIDAB in the last 72 hours.  Invalid input(s): FREET3 ------------------------------------------------------------------------------------------------------------------ No results for input(s): VITAMINB12, FOLATE, FERRITIN, TIBC, IRON, RETICCTPCT in the  last 72 hours.  Coagulation profile  Recent Labs Lab 08/04/16 1731  INR 1.06    No results for input(s): DDIMER in the last 72 hours.  Cardiac Enzymes No results for input(s): CKMB, TROPONINI, MYOGLOBIN in the last 168 hours.  Invalid input(s): CK ------------------------------------------------------------------------------------------------------------------    Component Value Date/Time   BNP 70.2 08/09/2016 0507    Inpatient Medications  Scheduled Meds: .  acyclovir  400 mg Oral Daily  . albuterol  2.5 mg Nebulization QID  . amitriptyline  75 mg Oral QHS  . aspirin  81 mg Oral Daily  . atorvastatin  20 mg Oral q1800  . baclofen  5 mg Oral TID  . bisoprolol  5 mg Oral Daily  . dantrolene  50 mg Oral TID AC & HS  . darifenacin  15 mg Oral Daily  . enoxaparin (LOVENOX) injection  40 mg Subcutaneous Daily  . feeding supplement (ENSURE ENLIVE)  237 mL Oral BID BM  . FLUoxetine  40 mg Oral Daily  . furosemide  60 mg Intravenous Q8H  . Gerhardt's butt cream   Topical BID  . guaiFENesin  1,200 mg Oral BID  . lamoTRIgine  200 mg Oral TID  . LORazepam  1 mg Oral Once  . mesalamine  1,000 mg Oral TID  . OXcarbazepine  150 mg Oral TID  . pantoprazole  40 mg Oral BID AC  . Teriflunomide  1 tablet Oral Daily   Continuous Infusions: PRN Meds:.acetaminophen **OR** acetaminophen, diphenoxylate-atropine, ondansetron **OR** ondansetron (ZOFRAN) IV, sodium chloride  Micro Results Recent Results (from the past 240 hour(s))  Respiratory Panel by PCR     Status: Abnormal   Collection Time: 08/08/16  3:12 PM  Result Value Ref Range Status   Adenovirus NOT DETECTED NOT DETECTED Final   Coronavirus 229E NOT DETECTED NOT DETECTED Final   Coronavirus HKU1 NOT DETECTED NOT DETECTED Final   Coronavirus NL63 NOT DETECTED NOT DETECTED Final   Coronavirus OC43 NOT DETECTED NOT DETECTED Final   Metapneumovirus DETECTED (A) NOT DETECTED Final   Rhinovirus / Enterovirus NOT DETECTED NOT  DETECTED Final   Influenza A NOT DETECTED NOT DETECTED Final   Influenza B NOT DETECTED NOT DETECTED Final   Parainfluenza Virus 1 NOT DETECTED NOT DETECTED Final   Parainfluenza Virus 2 NOT DETECTED NOT DETECTED Final   Parainfluenza Virus 3 NOT DETECTED NOT DETECTED Final   Parainfluenza Virus 4 NOT DETECTED NOT DETECTED Final   Respiratory Syncytial Virus NOT DETECTED NOT DETECTED Final   Bordetella pertussis NOT DETECTED NOT DETECTED Final   Chlamydophila pneumoniae NOT DETECTED NOT DETECTED Final   Mycoplasma pneumoniae NOT DETECTED NOT DETECTED Final  MRSA PCR Screening     Status: None   Collection Time: 08/09/16 11:48 AM  Result Value Ref Range Status   MRSA by PCR NEGATIVE NEGATIVE Final    Comment:        The GeneXpert MRSA Assay (FDA approved for NASAL specimens only), is one component of a comprehensive MRSA colonization surveillance program. It is not intended to diagnose MRSA infection nor to guide or monitor treatment for MRSA infections.   C difficile quick scan w PCR reflex     Status: None   Collection Time: 08/09/16  2:28 PM  Result Value Ref Range Status   C Diff antigen NEGATIVE NEGATIVE Final   C Diff toxin NEGATIVE NEGATIVE Final   C Diff interpretation No C. difficile detected.  Final    Radiology Reports Dg Chest 2 View  Result Date: 08/04/2016 CLINICAL DATA:  Hypoxia with cough EXAM: CHEST  2 VIEW COMPARISON:  05/30/2013 FINDINGS: Right lung is grossly clear. Airspace opacity is present within the left lower lobe and lingula consistent with pneumonia. Small left pleural effusion. Stable mild cardiomegaly. There is mild central vascular congestion. No pneumothorax. Scoliosis of the lower spine. IMPRESSION: 1. Moderate left mid and lower lung infiltrates with small left  pleural effusion. 2. Mild cardiomegaly with central vascular congestion Electronically Signed   By: Donavan Foil M.D.   On: 08/04/2016 22:29   Ct Head Wo Contrast  Result Date:  08/04/2016 CLINICAL DATA:  Slurred speech and lightheadedness overnight. EXAM: CT HEAD WITHOUT CONTRAST TECHNIQUE: Contiguous axial images were obtained from the base of the skull through the vertex without intravenous contrast. COMPARISON:  01/24/2015 FINDINGS: Brain: There is no intracranial hemorrhage, mass or evidence of acute infarction. There is moderate generalized atrophy. There is moderate chronic microvascular ischemic change. There is no significant extra-axial fluid collection. No acute intracranial findings are evident. Vascular: No hyperdense vessel or unexpected calcification. Skull: Normal. Negative for fracture or focal lesion. Sinuses/Orbits: No acute finding. Other: None. IMPRESSION: No acute intracranial findings. There is moderate generalized atrophy and chronic appearing white matter hypodensities which likely represent small vessel ischemic disease. Electronically Signed   By: Andreas Newport M.D.   On: 08/04/2016 18:22   Mr Brain Wo Contrast  Result Date: 08/05/2016 CLINICAL DATA:  Altered mental status EXAM: MRI HEAD WITHOUT CONTRAST TECHNIQUE: Multiplanar, multiecho pulse sequences of the brain and surrounding structures were obtained without intravenous contrast. COMPARISON:  Head CT 08/04/2016 Brain MRI 01/24/2015 FINDINGS: Brain: Punctate focus of high signal on the diffusion-weighted images at the posterior aspect of the right basal ganglia is unchanged compared to 01/24/2015. There is an additional punctate focus of diffusion restriction in the posterior aspect of the left basal ganglia There is severe white matter disease with confluent lesions in the periventricular white matter, oriented perpendicularly to the long axis of the lateral ventricles. There is atrophy of the corpus callosum. There are numerous low T1 weighted signal lesions. No mass lesion or midline shift. No hydrocephalus or extra-axial fluid collection. There is a left middle cranial fossa arachnoid cyst.  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 Vascular: Major intracranial arterial and venous sinus flow voids are preserved. No evidence of chronic microhemorrhage or amyloid angiopathy. Skull and upper cervical spine: The visualized skull base, calvarium, upper cervical spine and extracranial soft tissues are normal. Sinuses/Orbits: No fluid levels or advanced mucosal thickening. No mastoid effusion. Normal orbits. IMPRESSION: 1. Severe white matter disease in a pattern consistent with multiple sclerosis. Unchanged distribution of T2 hyperintense lesions. 2. Punctate focus of diffusion restriction at the posterior aspect of the left basal ganglia. These may indicate a tiny area of acute ischemia; however, active demyelinating MS plaques may uncommonly show diffusion restriction. Contrast administration might be helpful for differentiation, if necessary. Electronically Signed   By: Ulyses Jarred M.D.   On: 08/05/2016 03:27   Mr Brain W Contrast  Result Date: 08/05/2016 CLINICAL DATA:  50 year old female with history of multiple sclerosis and quadriplegia presenting with confusion and slurred speech 2 days ago. Cough with patient noted to be hypoxic on room air. Subsequent encounter. EXAM: MRI HEAD WITH CONTRAST TECHNIQUE: Multiplanar, multiecho pulse sequences of the brain and surrounding structures were obtained with intravenous contrast. CONTRAST:  59m MULTIHANCE GADOBENATE DIMEGLUMINE 529 MG/ML IV SOLN COMPARISON:  None. FINDINGS: Brain: Multiple white matter lesions consistent with demyelinating plaques none of which demonstrate enhancement as can be seen with active area of demyelination. No associated mass effect as can be seen with progressive multifocal leukoencephalopathy. Punctate region of restricted motion posterior limb of the right internal capsule may represent a small acute infarct or tiny area of active demyelination. Global atrophy. Anterior left  middle cranial fossa arachnoid cyst. Vascular:  Major intracranial vascular structures are patent. Skull and upper cervical spine: No acute abnormality. Sinuses/Orbits: No abnormal enhancement of the optic nerves. Other: Partially empty sella incidentally noted. IMPRESSION: Multiple white matter lesions consistent with demyelinating plaques none of which demonstrate enhancement as can be seen with active area of demyelination. No associated mass effect as can be seen with progressive multifocal leukoencephalopathy. Punctate region of restricted motion posterior limb of the right internal capsule may represent a tiny acute infarct although it is still possible this represents a tiny area of active demyelination which does not enhance. Electronically Signed   By: Genia Del M.D.   On: 08/05/2016 17:46   Dg Chest Port 1 View  Result Date: 08/10/2016 CLINICAL DATA:  Bilateral pneumonia, cough, congestion EXAM: PORTABLE CHEST 1 VIEW COMPARISON:  08/09/2016 FINDINGS: There is mild bilateral interstitial thickening with significant interval improvement in the left mid and lower lung airspace opacities. There is no pleural effusion or pneumothorax. The heart mediastinum are stable. The osseous structures are unremarkable. IMPRESSION: 1. There is mild bilateral interstitial thickening with significant interval improvement in the left mid and lower lung airspace opacities. Electronically Signed   By: Kathreen Devoid   On: 08/10/2016 07:56   Dg Chest Port 1 View  Result Date: 08/09/2016 CLINICAL DATA:  Cough and congestion EXAM: PORTABLE CHEST 1 VIEW COMPARISON:  August 08, 2016 FINDINGS: There is persistent airspace consolidation throughout the left mid lower lung zones with small left pleural effusion. The right lung is clear. Heart is mildly enlarged with pulmonary vascularity within normal limits. No adenopathy. No bone lesions. IMPRESSION: Persistent extensive airspace consolidation, likely pneumonia, on the  left with small left pleural effusion. Right lung clear. Stable cardiac prominence. Followup PA and lateral chest radiographs recommended in 3-4 weeks following trial of antibiotic therapy to ensure resolution and exclude underlying malignancy. Electronically Signed   By: Lowella Grip III M.D.   On: 08/09/2016 09:05   Dg Chest Port 1 View  Result Date: 08/08/2016 CLINICAL DATA:  Hypoxia EXAM: PORTABLE CHEST 1 VIEW COMPARISON:  August 08, 2016 study obtained earlier in the day FINDINGS: There is airspace consolidation throughout the mid and lower lung zones. There has been partial clearing of infiltrate from the right base. No new opacity is evident. There is cardiomegaly with pulmonary vascularity within normal limits. No adenopathy. No bone lesions. IMPRESSION: Interval partial clearing from right base. Persistent consolidation left mid and lower lung zones. Stable cardiomegaly. Electronically Signed   By: Lowella Grip III M.D.   On: 08/08/2016 15:53   Dg Chest Port 1 View  Result Date: 08/08/2016 CLINICAL DATA:  50 year old female with a history of shortness of breath EXAM: PORTABLE CHEST 1 VIEW COMPARISON:  08/07/2016, 08/04/2016 FINDINGS: Cardiomediastinal silhouette unchanged. Compared to the most recent chest x-ray there is persisting mixed interstitial and airspace opacity of the bilateral mid and lower lobes. Mild interlobular septal thickening. Obscuration of left hemidiaphragm and retrocardiac region. No pneumothorax. IMPRESSION: Persisting bilateral interstitial and airspace opacity, potentially representing a combination of edema, consolidation, atelectasis, and/or small pleural fluid. Signed, Dulcy Fanny. Earleen Newport, DO [facility] Radiology Electronically Signed   By: Corrie Mckusick D.O.   On: 08/08/2016 07:53   Dg Chest Port 1 View  Result Date: 08/07/2016 CLINICAL DATA:  Acute onset of hypoxia.  Initial encounter. EXAM: PORTABLE CHEST  1 VIEW COMPARISON:  Chest radiograph performed earlier today at 8:14 a.m. FINDINGS: Worsening bibasilar and bilateral central airspace opacification raises  concern for worsening pneumonia, though pulmonary edema might have a similar appearance. A small left pleural effusion is noted. No pneumothorax is seen. The cardiomediastinal silhouette is mildly enlarged. No acute osseous abnormalities are seen. IMPRESSION: Worsening bibasilar and bilateral central airspace opacification raises concern for worsening pneumonia, though pulmonary edema might have a similar appearance. Small left pleural effusion noted. Mild cardiomegaly. Electronically Signed   By: Garald Balding M.D.   On: 08/07/2016 18:23   Dg Chest Port 1 View  Result Date: 08/07/2016 CLINICAL DATA:  Congestion and cough. EXAM: PORTABLE CHEST 1 VIEW COMPARISON:  08/04/2016 FINDINGS: Lungs are adequately inflated with persistent airspace opacification over the left mid to lower lung suggesting pneumonia. No definite effusion. Right lung is clear. Mild stable cardiomegaly. Remainder of the exam is unchanged. IMPRESSION: Persistent airspace process over the left mid to lower lung likely a pneumonia. Electronically Signed   By: Marin Olp M.D.   On: 08/07/2016 09:01      Tranell Wojtkiewicz M.D on 08/11/2016 at 2:13 PM  Between 7am to 7pm - Pager - [phone]  After 7pm go to [email or web address] - password [facility]  [facility] -  Office  [phone]

## 2016-08-11 NOTE — Clinical Social Work Placement (Signed)
   CLINICAL SOCIAL WORK PLACEMENT  NOTE  Date:  08/11/2016  Patient Details  Name: Vanessa Short MRN: [id number] Date of Birth: Jun 20, 1966  Clinical Social Work is seeking post-discharge placement for this patient at the [place] level of care (*CSW will initial, date and re-position this form in  chart as items are completed):  Yes   Patient/family provided with [facility] Work Department's list of facilities offering this level of care within the geographic area requested by the patient (or if unable, by the patient's family).  Yes   Patient/family informed of their freedom to choose among providers that offer the needed level of care, that participate in Medicare, Medicaid or managed care program needed by the patient, have an available bed and are willing to accept the patient.  Yes   Patient/family informed of [facility]'s ownership interest in [facility] and [facility], as well as of the fact that they are under no obligation to receive care at these facilities.  PASRR submitted to EDS on 08/11/16     PASRR number received on       Existing PASRR number confirmed on       FL2 transmitted to all facilities in geographic area requested by pt/family on 08/11/16     FL2 transmitted to all facilities within larger geographic area on 08/11/16     Patient informed that his/her managed care company has contracts with or will negotiate with certain facilities, including the following:            Patient/family informed of bed offers received.  Patient chooses bed at       Physician recommends and patient chooses bed at      Patient to be transferred to   on  .  Patient to be transferred to facility by       Patient family notified on   of transfer.  Name of family member notified:        PHYSICIAN Please sign FL2     Additional Comment:    _______________________________________________ Candie Chroman, LCSW 08/11/2016,  10:34 AM

## 2016-08-11 NOTE — Progress Notes (Signed)
Physical Therapy Treatment Patient Details Name: Vanessa Short MRN: [id number] DOB: Oct 16, 1965 Today's Date: 08/11/2016    History of Present Illness 50 y.o. female with a past medical history significant for multiple sclerosis with quadruplegia who presents with 2 days cough as well as confusion and slurred speech, Workup significant for CAP    PT Comments    Pt agreeable to attempt coming to stand in standing lift to attempt to replicate transfers at home.  Husband indicates pt normally performs Bil LE PROM and stretching prior to getting up.  Requested husband show PT the stretches pt is accustomed to and husband indicates he doesn't know what to do because he is not home and the aide takes care of pt.  Asked husband to participate in bed mobility and to A with positioning pt on lift as he indicates he has to position LEs a particular way due to the tone in the R LE, but husband simply points and directs PT what to do without assisting.  Pt only able to come to sitting at EOB, then seems to become anxious and c/o nausea.  Pt then insisting on returning to bed.  Feel pt will need Hoyer lift for any future transfers due to standing lift not being a safe option for a dependent pt.  Will continue to follow.    Follow Up Recommendations  SNF     Equipment Recommendations   Optician, dispensing)    Recommendations for Other Services       Precautions / Restrictions Precautions Precautions: Fall Restrictions Weight Bearing Restrictions: No    Mobility  Bed Mobility Overal bed mobility: Needs Assistance Bed Mobility: Supine to Sit;Sit to Supine     Supine to sit: Total assist;+2 for physical assistance Sit to supine: Total assist;+2 for physical assistance   General bed mobility comments: pt without participation in bed mobility, which is baseline per husband report.    Transfers                    Ambulation/Gait                 Stairs            Wheelchair  Mobility    Modified Rankin (Stroke Patients Only)       Balance Overall balance assessment: Needs assistance Sitting-balance support: Feet supported Sitting balance-Leahy Scale: Zero Sitting balance - Comments: pt without trunk activation or UE activation to A with maintaining balance.  Extensor spasms in Bil LEs while sitting requiring increased A to maintain sitting, and question truncal extensor tone.   Postural control: Posterior lean                          Cognition Arousal/Alertness: Awake/alert Behavior During Therapy: Flat affect Overall Cognitive Status: History of cognitive impairments - at baseline                      Exercises Other Exercises Other Exercises: PROM to Bil LEs    General Comments        Pertinent Vitals/Pain Pain Assessment: Faces Faces Pain Scale: Hurts little more Pain Location: Grimaces while sitting, though indicates nausea not pain.   Pain Descriptors / Indicators: Grimacing Pain Intervention(s): Monitored during session;Premedicated before session;Repositioned    Home Living                      Prior Function  PT Goals (current goals can now be found in the care plan section) Acute Rehab PT Goals Patient Stated Goal: For pt to return home if able to use standing lift PT Goal Formulation: With patient/family Time For Goal Achievement: 08/15/16 Potential to Achieve Goals: Fair Progress towards PT goals: Progressing toward goals    Frequency    Min 2X/week      PT Plan Frequency needs to be updated    Co-evaluation             End of Session Equipment Utilized During Treatment: Oxygen Activity Tolerance:  (Limited by nausea) Patient left: in bed;with family/visitor present (pt unable to use either call button)     Time: 6435-3912 PT Time Calculation (min) (ACUTE ONLY): 29 min  Charges:  $Therapeutic Activity: 23-37 mins                    G CodesCatarina Hartshorn, [place]  [phone] 08/11/2016, 2:13 PM

## 2016-08-11 NOTE — NC FL2 (Signed)
[place] MEDICAID FL2 LEVEL OF CARE SCREENING TOOL     IDENTIFICATION  Patient Name: Elloise Short Birthdate: 06-09-1966 Sex: female Admission Date (Current Location): 08/04/2016  [facility] and [place] Number:  Herbalist and Address:  The [place]. [facility], [place] [address] [place], [place] [address]      Provider Number: [id number]  Attending Physician Name and Address:  Albertine Patricia, MD  Relative Name and Phone Number:       Current Level of Care: Hospital Recommended Level of Care: [place] Prior Approval Number:    Date Approved/Denied:   PASRR Number: Pending  Discharge Plan: SNF    Current Diagnoses: Patient Active Problem List   Diagnosis Date Noted  . Acute respiratory failure with hypoxia ([place]) 08/07/2016  . CAP (community acquired pneumonia) 08/07/2016  . Left lower lobe pneumonia ([place]) 08/05/2016  . Hypokalemia 08/05/2016  . Normocytic anemia 08/05/2016  . Slurred speech 08/05/2016  . Ulcerative colitis ([place]) 08/05/2016  . Essential hypertension 08/05/2016  . Subacromial bursitis 05/09/2016  . Visual hallucination 03/14/2016  . Other fatigue 11/09/2015  . Urinary disorder 11/09/2015  . Ear ache 10/13/2015  . Sore throat 10/13/2015  . Hand weakness 01/06/2015  . Multiple sclerosis ([place]) 09/07/2014  . 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 Dysesthesia 09/07/2014  . Depression 09/07/2014    Orientation RESPIRATION BLADDER Height & Weight     Self, Time, Place  O2 (Nasal canula 0.5 L) Incontinent, Indwelling catheter Weight: 171 lb 8.3 oz (77.8 kg) Height:  5' 3"  (160 cm)  BEHAVIORAL SYMPTOMS/MOOD NEUROLOGICAL BOWEL NUTRITION STATUS   (Per RN, sometimes makes inappropriate comments.)  (None) Incontinent (Rectal tube) Diet (Regular)  AMBULATORY STATUS COMMUNICATION OF NEEDS Skin   Total Care Verbally Other (Comment) (MASD on buttocks, groin, and perineum)                       Personal Care  Assistance Level of Assistance              Functional Limitations Info  Sight, Hearing, Speech Sight Info: Adequate Hearing Info: Adequate Speech Info: Adequate    SPECIAL CARE FACTORS FREQUENCY  PT (By licensed PT), Blood pressure     PT Frequency: 5 x week              Contractures Contractures Info: Not present    Additional Factors Info  Code Status, Allergies, Psychotropic Code Status Info: Full Allergies Info: Penicillins Psychotropic Info: Depression, history of visual hallucinations: Prozac 40 mg PO daily, Lamictal 200 mg PO TID         Current Medications (08/11/2016):  This is the current hospital active medication list Current Facility-Administered Medications  Medication Dose Route Frequency Provider Last Rate Last Dose  . acetaminophen (TYLENOL) tablet 650 mg  650 mg Oral Q6H PRN Edwin Dada, MD   650 mg at 08/06/16 2246   Or  . acetaminophen (TYLENOL) suppository 650 mg  650 mg Rectal Q6H PRN Edwin Dada, MD      . acyclovir (ZOVIRAX) tablet 400 mg  400 mg Oral Daily Edwin Dada, MD   400 mg at 08/11/16 4431  . albuterol (PROVENTIL) (2.5 MG/3ML) 0.083% nebulizer solution 2.5 mg  2.5 mg Nebulization QID Tanda Rockers, MD   2.5 mg at 08/10/16 2014  . amitriptyline (ELAVIL) tablet 75 mg  75 mg Oral QHS Edwin Dada, MD   75 mg at 08/10/16 2111  .  aspirin chewable tablet 81 mg  81 mg Oral Daily Greta Doom, MD   81 mg at 08/11/16 2563  . atorvastatin (LIPITOR) tablet 20 mg  20 mg Oral q1800 Albertine Patricia, MD   20 mg at 08/10/16 1803  . baclofen (LIORESAL) tablet 5 mg  5 mg Oral TID Edwin Dada, MD   5 mg at 08/11/16 8937  . bisoprolol (ZEBETA) tablet 5 mg  5 mg Oral Daily Tanda Rockers, MD   5 mg at 08/11/16 3428  . dantrolene (DANTRIUM) capsule 50 mg  50 mg Oral TID AC & HS Edwin Dada, MD   50 mg at 08/11/16 0555  . darifenacin (ENABLEX) 24 hr tablet 15 mg  15 mg Oral Daily  Edwin Dada, MD   15 mg at 08/11/16 0835  . diphenoxylate-atropine (LOMOTIL) 2.5-0.025 MG per tablet 2 tablet  2 tablet Oral QID PRN Albertine Patricia, MD   2 tablet at 08/10/16 1337  . enoxaparin (LOVENOX) injection 40 mg  40 mg Subcutaneous Daily Edwin Dada, MD   40 mg at 08/11/16 0836  . feeding supplement (ENSURE ENLIVE) (ENSURE ENLIVE) liquid 237 mL  237 mL Oral BID BM Silver Huguenin Elgergawy, MD   237 mL at 08/10/16 1000  . FLUoxetine (PROZAC) capsule 40 mg  40 mg Oral Daily Edwin Dada, MD   40 mg at 08/11/16 7681  . furosemide (LASIX) injection 60 mg  60 mg Intravenous Q8H Albertine Patricia, MD   60 mg at 08/11/16 0556  . Gerhardt's butt cream   Topical BID Albertine Patricia, MD      . guaiFENesin [facility]) 12 hr tablet 1,200 mg  1,200 mg Oral BID Tanda Rockers, MD   1,200 mg at 08/11/16 1572  . lamoTRIgine (LAMICTAL) tablet 200 mg  200 mg Oral TID Edwin Dada, MD   200 mg at 08/11/16 6203  . LORazepam (ATIVAN) tablet 1 mg  1 mg Oral Once Jeryl Columbia, NP      . mesalamine (PENTASA) CR capsule 1,000 mg  1,000 mg Oral TID Edwin Dada, MD   1,000 mg at 08/11/16 0834  . ondansetron (ZOFRAN) tablet 4 mg  4 mg Oral Q6H PRN Edwin Dada, MD       Or  . ondansetron (ZOFRAN) injection 4 mg  4 mg Intravenous Q6H PRN Edwin Dada, MD   4 mg at 08/07/16 1803  . Oxcarbazepine (TRILEPTAL) tablet 150 mg  150 mg Oral TID Edwin Dada, MD   150 mg at 08/11/16 5597  . pantoprazole (PROTONIX) EC tablet 40 mg  40 mg Oral BID AC Tanda Rockers, MD   40 mg at 08/11/16 4163  . sodium chloride (OCEAN) 0.65 % nasal spray 1 spray  1 spray Each Nare PRN Albertine Patricia, MD      . Teriflunomide TABS 1 tablet  1 tablet Oral Daily Albertine Patricia, MD   1 tablet at 08/10/16 1803     Discharge Medications: Please see discharge summary for a list of discharge medications.  Relevant Imaging Results:  Relevant Lab  Results:   Additional Information SS#: [id number]. Also needs chest PT.  Candie Chroman, LCSW

## 2016-08-11 NOTE — Clinical Social Work Note (Signed)
Clinical Social Work Assessment  Patient Details  Name: Vanessa Short MRN: [id number] Date of Birth: 10-18-1965  Date of referral:  08/11/16               Reason for consult:  Facility Placement, Discharge Planning                Permission sought to share information with:  Facility Sport and exercise psychologist, Family Supports Permission granted to share information::  Yes, Verbal Permission Granted  Name::     Vanessa Short  Agency::  SNF's  Relationship::  Spouse  Contact Information:  [phone]  Housing/Transportation Living arrangements for the past 2 months:  Single Family Home Source of Information:  Patient, Medical Team, Other (Comment Required) (Friend) Patient Interpreter Needed:  None Criminal Activity/Legal Involvement Pertinent to Current Situation/Hospitalization:  No - Comment as needed Significant Relationships:  Adult Children, Spouse Lives with:  Adult Children, Spouse Do you feel safe going back to the place where you live?  Yes Need for family participation in patient care:  Yes (Comment)  Care giving concerns:  PT recommending SNF once medically stable.   Social Worker assessment / plan:  CSW met with patient. Friend at bedside. CSW introduced role and explained that PT recommendations would be discussed. Patient agreeable to SNF. No particular SNF as first preference. PASARR under manual review. Patient cannot discharge to SNF without it. No further concerns. CSW encouraged patient to contact CSW as needed. CSW will continue to follow patient for support and facilitate discharge to SNF once medically stable.  Employment status:  Disabled (Comment on whether or not currently receiving Disability) Insurance information:  Other (Comment Required) Scientist, clinical (histocompatibility and immunogenetics)) PT Recommendations:  [place] / Referral to community resources:  [place]  Patient/Family's Response to care:  Patient agreeable to SNF. Patient's family and friends  supportive and involved in patient's care. Patient appreciated social work intervention.  Patient/Family's Understanding of and Emotional Response to Diagnosis, Current Treatment, and Prognosis:  Patient understands and is agreeable to discharge planning. Patient appears happy with hospital care.  Emotional Assessment Appearance:  Appears stated age Attitude/Demeanor/Rapport:  Other (Pleasant) Affect (typically observed):  Accepting, Appropriate, Calm, Pleasant Orientation:  Oriented to Self, Oriented to Place, Oriented to  Time, Oriented to Situation Alcohol / Substance use:  Never Used Psych involvement (Current and /or in the community):  No (Comment)  Discharge Needs  Concerns to be addressed:  Care Coordination Readmission within the last 30 days:  No Current discharge risk:  Dependent with Mobility Barriers to Discharge:  Awaiting State Approval Tour manager), Continued Medical Work up, [place], LCSW 08/11/2016, 10:31 AM

## 2016-08-12 ENCOUNTER — Inpatient Hospital Stay (HOSPITAL_COMMUNITY): Payer: Managed Care, Other (non HMO)

## 2016-08-12 LAB — CREATININE, SERUM
CREATININE: 1.11 mg/dL — AB (ref 0.44–1.00)
GFR calc non Af Amer: 56 mL/min — ABNORMAL LOW (ref >60)

## 2016-08-12 MED ORDER — FUROSEMIDE 40 MG PO TABS
40.0000 mg | ORAL_TABLET | Freq: Two times a day (BID) | ORAL | Status: DC
  Administered 2016-08-12 – 2016-08-19 (×14): 40 mg via ORAL
  Filled 2016-08-12 (×14): qty 1

## 2016-08-12 NOTE — Progress Notes (Signed)
[facility] PROGRESS NOTE  Vanessa Short WLN:[id number] DOB: February 15, 1966 DOA: 08/04/2016  PCP: [facility]  Brief History/Interval Summary: 50 y.o.femalewith a past medical history significant for multiple sclerosis with quadruplegiawho presented with 2 days cough as well as confusion and slurred speech, Workup significant for CAP, Transferred to stepdown on 12/25 giving respiratory distress and worsening hypoxia, Chest x-ray with worsening bilateral infiltrate, worsening pneumonia versus volume overload, 2-D echo with diastolic dysfunction, respiratory status improved with IV diuresis, respiratory panel significant for metapneumovirus.  Reason for Visit: Pneumonia.  Consultants: None  Procedures:  Transthoracic echocardiogram Study Conclusions  - Left ventricle: The cavity size was normal. There was moderate   concentric hypertrophy. Systolic function was normal. The   estimated ejection fraction was in the range of 55% to 60%. Wall   motion was normal; there were no regional wall motion   abnormalities. Features are consistent with a pseudonormal left   ventricular filling pattern, with concomitant abnormal relaxation   and increased filling pressure (grade 2 diastolic dysfunction).   Doppler parameters are consistent with indeterminate ventricular   filling pressure. - Aortic valve: Transvalvular velocity was within the normal range.   There was no stenosis. There was no regurgitation. - Mitral valve: Transvalvular velocity was within the normal range.   There was no evidence for stenosis. There was trivial   regurgitation. - Left atrium: The atrium was mildly dilated. - Right ventricle: The cavity size was normal. Wall thickness was   normal. Systolic function was normal. - Atrial septum: No defect or patent foramen ovale was identified   by color flow Doppler. - Tricuspid valve: There was trivial regurgitation. - Pulmonary  arteries: Systolic pressure was within the normal   range. PA peak pressure: 31 mm Hg (S).   Antibiotics: Patient finished a course of antibiotics  Subjective/Interval History: Continues to have a cough which is dry for the most part. Denies any chest pain. Denies any shortness of breath.  ROS: Denies any nausea or vomiting  Objective:  Vital Signs  Vitals:   08/12/16 1003 08/12/16 1325 08/12/16 1456 08/12/16 1514  BP: (!) 142/90 114/71 (!) 155/86   Pulse:  77    Resp: (!) 21 15 (!) 21   Temp:      TempSrc:      SpO2:  92%  98%  Weight:      Height:        Intake/Output Summary (Last 24 hours) at 08/12/16 1617 Last data filed at 08/12/16 1400  Gross per 24 hour  Intake              730 ml  Output                0 ml  Net              730 ml   Filed Weights   08/10/16 1449 08/11/16 0500 08/12/16 0500  Weight: 82 kg (180 lb 12.4 oz) 77.8 kg (171 lb 8.3 oz) 76.9 kg (169 lb 9.6 oz)    General appearance: alert, cooperative, appears stated age and no distress Resp: Coarse breath sounds bilaterally. No significant wheezing, crackles or rhonchi. Cardio: regular rate and rhythm, S1, S2 normal, no murmur, click, rub or gallop GI: soft, non-tender; bowel sounds normal; no masses,  no organomegaly Extremities: extremities normal, atraumatic, no cyanosis or edema Neurologic: Patient with history of MS. Has baseline neurological deficits  Lab Results:  Data Reviewed: I have personally reviewed  following labs and imaging studies  CBC:  Recent Labs Lab 08/08/16 0459 08/09/16 0500  WBC 5.4 5.4  HGB 10.0* 11.1*  HCT 31.1* 35.2*  MCV 100.0 99.2  PLT 158 654    Basic Metabolic Panel:  Recent Labs Lab 08/06/16 0416  08/07/16 0355 08/08/16 0459 08/09/16 0500 08/10/16 0438 08/11/16 0528 08/12/16 0458  NA  --   --  138 137 138 134* 137  --   K  --   --  3.4* 3.5 3.7 3.4* 3.5  --   CL  --   --  105 98* 95* 92* 96*  --   CO2  --   --  24 26 30 29 29   --   GLUCOSE   --   --  94 96 87 87 116*  --   BUN  --   --  13 11 13 16 16   --   CREATININE  --   < > 0.86 0.89 1.02* 0.97 1.07* 1.11*  CALCIUM  --   --  8.2* 7.8* 8.0* 8.3* 8.3*  --   MG 2.2  --   --   --   --   --   --   --   PHOS 2.4*  --   --   --   --   --   --   --   < > = values in this interval not displayed.  GFR: Estimated Creatinine Clearance: 58.9 mL/min (by C-G formula based on SCr of 1.11 mg/dL (H)).   Recent Results (from the past 240 hour(s))  Respiratory Panel by PCR     Status: Abnormal   Collection Time: 08/08/16  3:12 PM  Result Value Ref Range Status   Adenovirus NOT DETECTED NOT DETECTED Final   Coronavirus 229E NOT DETECTED NOT DETECTED Final   Coronavirus HKU1 NOT DETECTED NOT DETECTED Final   Coronavirus NL63 NOT DETECTED NOT DETECTED Final   Coronavirus OC43 NOT DETECTED NOT DETECTED Final   Metapneumovirus DETECTED (A) NOT DETECTED Final   Rhinovirus / Enterovirus NOT DETECTED NOT DETECTED Final   Influenza A NOT DETECTED NOT DETECTED Final   Influenza B NOT DETECTED NOT DETECTED Final   Parainfluenza Virus 1 NOT DETECTED NOT DETECTED Final   Parainfluenza Virus 2 NOT DETECTED NOT DETECTED Final   Parainfluenza Virus 3 NOT DETECTED NOT DETECTED Final   Parainfluenza Virus 4 NOT DETECTED NOT DETECTED Final   Respiratory Syncytial Virus NOT DETECTED NOT DETECTED Final   Bordetella pertussis NOT DETECTED NOT DETECTED Final   Chlamydophila pneumoniae NOT DETECTED NOT DETECTED Final   Mycoplasma pneumoniae NOT DETECTED NOT DETECTED Final  MRSA PCR Screening     Status: None   Collection Time: 08/09/16 11:48 AM  Result Value Ref Range Status   MRSA by PCR NEGATIVE NEGATIVE Final    Comment:        The GeneXpert MRSA Assay (FDA approved for NASAL specimens only), is one component of a comprehensive MRSA colonization surveillance program. It is not intended to diagnose MRSA infection nor to guide or monitor treatment for MRSA infections.   C difficile quick scan  w PCR reflex     Status: None   Collection Time: 08/09/16  2:28 PM  Result Value Ref Range Status   C Diff antigen NEGATIVE NEGATIVE Final   C Diff toxin NEGATIVE NEGATIVE Final   C Diff interpretation No C. difficile detected.  Final      Radiology Studies: Dg Chest Port 1  View  Result Date: 08/12/2016 CLINICAL DATA:  Chest pain, shortness of Breath EXAM: PORTABLE CHEST 1 VIEW COMPARISON:  08/10/2016 FINDINGS: Cardiomegaly. No confluent opacity on the right. Focal airspace opacity in the left lower lobe. No definite effusion or acute bony abnormality. IMPRESSION: Cardiomegaly. Left lower lobe opacity.  Cannot exclude pneumonia. Electronically Signed   By: Rolm Baptise M.D.   On: 08/12/2016 12:26     Medications:  Scheduled: . acyclovir  400 mg Oral Daily  . albuterol  2.5 mg Nebulization QID  . amitriptyline  75 mg Oral QHS  . aspirin  81 mg Oral Daily  . atorvastatin  20 mg Oral q1800  . baclofen  5 mg Oral TID  . bisoprolol  5 mg Oral Daily  . dantrolene  50 mg Oral TID AC & HS  . darifenacin  15 mg Oral Daily  . enoxaparin (LOVENOX) injection  40 mg Subcutaneous Daily  . feeding supplement (ENSURE ENLIVE)  237 mL Oral BID BM  . FLUoxetine  40 mg Oral Daily  . furosemide  60 mg Intravenous Q8H  . Gerhardt's butt cream   Topical BID  . guaiFENesin  1,200 mg Oral BID  . lamoTRIgine  200 mg Oral TID  . LORazepam  1 mg Oral Once  . mesalamine  1,000 mg Oral TID  . OXcarbazepine  150 mg Oral TID  . pantoprazole  40 mg Oral BID AC  . Teriflunomide  1 tablet Oral Daily   Continuous:  QQI:[id number] **OR** acetaminophen, diphenoxylate-atropine, ondansetron **OR** ondansetron (ZOFRAN) IV, sodium chloride  Assessment/Plan:  Principal Problem:   Left lower lobe pneumonia (HCC) Active Problems:   Multiple sclerosis (HCC)   Spastic quadriparesis (HCC)   Depression   Hypokalemia   Normocytic anemia   Slurred speech   Ulcerative colitis ([place])   Essential hypertension    Acute respiratory failure with hypoxia (HCC)   CAP (community acquired pneumonia)    Community-acquired Pneumonia: - Chest x-ray significant for right middle lobe, left lower lobe pneumonia ,Treat total of 7 days of  IV Rocephin and azithromycin . -  urine strep antigen negative - Respiratory panel positive for metapneumovirus virus - RT for chest physiotherapy, IS, And flutter valve given very poor cough mechanics - Pro calcitonin normalized, afebrile, no leukocytosis. Chest x-ray repeated today continues to show a left lower lobe opacity. Patient and husband notified of this finding and also told that this is not unusual. These findings may be present for up to 4-6 weeks.   Acute hypoxic respiratory failure - Secondary to pneumonia , chest x-ray with worsening pneumonia since admission, respiratory failure most likely due to pneumonia, and poor cough mechanics , pulmonary input appreciated, continue with her towel, chest PT and nebulizers . - Improving, currently on 0.5 L of cannula, hopefully can be weaned off prior to discharge  Acute diastolic CHF - 2-D echo with preserved EF but with grade 2 diastolic dysfunction, respiratory status improving with IV diuresis. Chest x-ray does not show any evidence of fluid overload. Cut back on the dose of Lasix and change to oral today.  History of MS with quadriplegia - Continue Aubagio, dantrolene and baclofen - Continue amitriptyline and lamotrigine and Trileptal for dysesthesias - Continue Vesicare  Hypokalemia/hypophosphatemia - Repleted   Anemia: - Chronic, unchanged.  Slurred speech: - Suspect a metabolic encephalopathy from pneumonia in setting of MS.  - MRI brain with abnormal findings in basal ganglia, ischemic versus active demyelination. Neurology input greatly appreciated , concern  for stroke versus tiny demyelinating plaque, stroke diagnosis is not certain. Started on aspirin, Lipitor, 2-D echo with no embolic source,  carotid Doppler with no significant ICA stenosis, PT/OT . - We'll discharge on aspirin and Lipitor. Discussed in detail with patient and her husband today.  Ulcerative colitis: - Continue mesalamine  Depression: - Continue fluoxetine  Hypertension: - Continue labetalol  Other medications: - Continue acyclovir  Diarrhea - C. difficile negative, and she has flexes CL, improving on Lomotil, will discontinue flexes CL  DVT Prophylaxis: Lovenox    Code Status: Full code  Family Communication: Discussed with the patient and her husband  Disposition Plan: Management as outlined above. Await SNF placement.   LOS: 7 days   [facility] Pager [phone] 08/12/2016, 4:17 PM  If 7PM-7AM, please contact night-coverage at [email or web address], password [facility]

## 2016-08-12 NOTE — Progress Notes (Signed)
Re: CPT, pt was up in wheel chair earlier and RT unavail until now.  Currently, pt c/o nausea and does not want to do chest vest/CPT.  RN in room and aware. VSS, sat 100%

## 2016-08-12 NOTE — Progress Notes (Signed)
CPT performed. Patient tolerated the procedure very well. She had a strong non productive cough.

## 2016-08-13 DIAGNOSIS — I5031 Acute diastolic (congestive) heart failure: Secondary | ICD-10-CM

## 2016-08-13 LAB — CBC
HCT: 32.4 % — ABNORMAL LOW (ref 36.0–46.0)
Hemoglobin: 10.5 g/dL — ABNORMAL LOW (ref 12.0–15.0)
MCH: 31.7 pg (ref 26.0–34.0)
MCHC: 32.4 g/dL (ref 30.0–36.0)
MCV: 97.9 fL (ref 78.0–100.0)
PLATELETS: 200 10*3/uL (ref 150–400)
RBC: 3.31 MIL/uL — ABNORMAL LOW (ref 3.87–5.11)
RDW: 13.7 % (ref 11.5–15.5)
WBC: 7.8 10*3/uL (ref 4.0–10.5)

## 2016-08-13 LAB — BASIC METABOLIC PANEL
ANION GAP: 9 (ref 5–15)
BUN: 23 mg/dL — AB (ref 6–20)
CALCIUM: 9.4 mg/dL (ref 8.9–10.3)
CO2: 35 mmol/L — AB (ref 22–32)
CREATININE: 1.18 mg/dL — AB (ref 0.44–1.00)
Chloride: 94 mmol/L — ABNORMAL LOW (ref 101–111)
GFR calc Af Amer: >60 mL/min (ref >60)
GFR, EST NON AFRICAN AMERICAN: 52 mL/min — AB (ref >60)
GLUCOSE: 114 mg/dL — AB (ref 65–99)
Potassium: 3 mmol/L — ABNORMAL LOW (ref 3.5–5.1)
Sodium: 138 mmol/L (ref 135–145)

## 2016-08-13 MED ORDER — ALBUTEROL SULFATE (2.5 MG/3ML) 0.083% IN NEBU
2.5000 mg | INHALATION_SOLUTION | Freq: Two times a day (BID) | RESPIRATORY_TRACT | Status: DC
  Administered 2016-08-13 – 2016-08-17 (×8): 2.5 mg via RESPIRATORY_TRACT
  Filled 2016-08-13 (×10): qty 3

## 2016-08-13 MED ORDER — DOCUSATE SODIUM 100 MG PO CAPS
100.0000 mg | ORAL_CAPSULE | Freq: Two times a day (BID) | ORAL | Status: DC
  Administered 2016-08-13 – 2016-08-19 (×11): 100 mg via ORAL
  Filled 2016-08-13 (×11): qty 1

## 2016-08-13 MED ORDER — BACLOFEN 10 MG PO TABS
10.0000 mg | ORAL_TABLET | Freq: Two times a day (BID) | ORAL | Status: DC
  Administered 2016-08-13 – 2016-08-19 (×12): 10 mg via ORAL
  Filled 2016-08-13 (×12): qty 1

## 2016-08-13 MED ORDER — POTASSIUM CHLORIDE CRYS ER 20 MEQ PO TBCR
40.0000 meq | EXTENDED_RELEASE_TABLET | Freq: Once | ORAL | Status: AC
  Administered 2016-08-13: 40 meq via ORAL
  Filled 2016-08-13: qty 2

## 2016-08-13 MED ORDER — BACLOFEN 10 MG PO TABS
15.0000 mg | ORAL_TABLET | Freq: Every day | ORAL | Status: DC
  Administered 2016-08-13 – 2016-08-17 (×5): 15 mg via ORAL
  Administered 2016-08-18: 10 mg via ORAL
  Filled 2016-08-13 (×6): qty 2

## 2016-08-13 NOTE — Plan of Care (Signed)
Problem: Safety: Goal: Ability to remain free from injury will improve Outcome: Progressing Fall risk bundle in place. Hourly rounding performed and needs assessed. Bed in lowest position. Will continue to monitor.   Problem: Skin Integrity: Goal: Risk for impaired skin integrity will decrease Outcome: Progressing Pt turned q2h, repositioned and pillow placed on bony prominences. Allevyn foam pad on sacrum. Redness is present in the groin area. Cream applied to area per order. Brief is off and moisture barrier pad is under patient. Pt skin is currently clean and dry. Will continue to reposition and monitor skin for new signs of breakdown.

## 2016-08-13 NOTE — Clinical Social Work Note (Signed)
CSW received call from CN, pt ready for DC. CSW advised of issue with pt's PASRR, which is under manual review. Review will likely be completed after Holiday, Jan 2. CSW will continue to follow.  Jillian Warth B. Joline Maxcy Clinical Social Work Dept Weekend Social Worker [phone] 1:05 PM

## 2016-08-13 NOTE — Progress Notes (Signed)
[facility] PROGRESS NOTE  Vanessa Short OXB:[id number] DOB: 12/13/1965 DOA: 08/04/2016  PCP: [facility]  Brief History/Interval Summary: 50 y.o.femalewith a past medical history significant for multiple sclerosis with quadruplegiawho presented with 2 days cough as well as confusion and slurred speech, Workup significant for CAP, Transferred to stepdown on 12/25 giving respiratory distress and worsening hypoxia, Chest x-ray with worsening bilateral infiltrate, worsening pneumonia versus volume overload, 2-D echo with diastolic dysfunction, respiratory status improved with IV diuresis, respiratory panel significant for metapneumovirus.  Reason for Visit: Pneumonia.  Consultants: None  Procedures:  Transthoracic echocardiogram Study Conclusions  - Left ventricle: The cavity size was normal. There was moderate   concentric hypertrophy. Systolic function was normal. The   estimated ejection fraction was in the range of 55% to 60%. Wall   motion was normal; there were no regional wall motion   abnormalities. Features are consistent with a pseudonormal left   ventricular filling pattern, with concomitant abnormal relaxation   and increased filling pressure (grade 2 diastolic dysfunction).   Doppler parameters are consistent with indeterminate ventricular   filling pressure. - Aortic valve: Transvalvular velocity was within the normal range.   There was no stenosis. There was no regurgitation. - Mitral valve: Transvalvular velocity was within the normal range.   There was no evidence for stenosis. There was trivial   regurgitation. - Left atrium: The atrium was mildly dilated. - Right ventricle: The cavity size was normal. Wall thickness was   normal. Systolic function was normal. - Atrial septum: No defect or patent foramen ovale was identified   by color flow Doppler. - Tricuspid valve: There was trivial regurgitation. - Pulmonary  arteries: Systolic pressure was within the normal   range. PA peak pressure: 31 mm Hg (S).   Antibiotics: Patient finished a course of antibiotics  Subjective/Interval History: Patient feels well this morning. Not coughing as much. Somewhat upset with the nursing care over the last 24 hours.  ROS: Denies any nausea or vomiting  Objective:  Vital Signs  Vitals:   08/12/16 2006 08/12/16 2227 08/13/16 0500 08/13/16 0743  BP: (!) 145/78  (!) 105/58   Pulse: 83  79   Resp: 16  12   Temp: 98.9 F (37.2 C)  98.6 F (37 C)   TempSrc: Oral  Oral   SpO2: 93% 95% 94% 97%  Weight:   77.2 kg (170 lb 1.6 oz)   Height:        Intake/Output Summary (Last 24 hours) at 08/13/16 1058 Last data filed at 08/12/16 1900  Gross per 24 hour  Intake              480 ml  Output                0 ml  Net              480 ml   Filed Weights   08/11/16 0500 08/12/16 0500 08/13/16 0500  Weight: 77.8 kg (171 lb 8.3 oz) 76.9 kg (169 lb 9.6 oz) 77.2 kg (170 lb 1.6 oz)    General appearance: alert, cooperative, appears stated age and no distress Resp: Coarse breath sounds bilaterally. No significant wheezing, crackles or rhonchi. Cardio: regular rate and rhythm, S1, S2 normal, no murmur, click, rub or gallop GI: soft, non-tender; bowel sounds normal; no masses,  no organomegaly Neurologic: Patient with history of MS. Has baseline neurological deficits  Lab Results:  Data Reviewed: I have personally reviewed  following labs and imaging studies  CBC:  Recent Labs Lab 08/08/16 0459 08/09/16 0500 08/13/16 0726  WBC 5.4 5.4 7.8  HGB 10.0* 11.1* 10.5*  HCT 31.1* 35.2* 32.4*  MCV 100.0 99.2 97.9  PLT 158 161 168    Basic Metabolic Panel:  Recent Labs Lab 08/08/16 0459 08/09/16 0500 08/10/16 0438 08/11/16 0528 08/12/16 0458 08/13/16 0726  NA 137 138 134* 137  --  138  K 3.5 3.7 3.4* 3.5  --  3.0*  CL 98* 95* 92* 96*  --  94*  CO2 26 30 29 29   --  35*  GLUCOSE 96 87 87 116*  --   114*  BUN 11 13 16 16   --  23*  CREATININE 0.89 1.02* 0.97 1.07* 1.11* 1.18*  CALCIUM 7.8* 8.0* 8.3* 8.3*  --  9.4    GFR: Estimated Creatinine Clearance: 55.5 mL/min (by C-G formula based on SCr of 1.18 mg/dL (H)).   Recent Results (from the past 240 hour(s))  Respiratory Panel by PCR     Status: Abnormal   Collection Time: 08/08/16  3:12 PM  Result Value Ref Range Status   Adenovirus NOT DETECTED NOT DETECTED Final   Coronavirus 229E NOT DETECTED NOT DETECTED Final   Coronavirus HKU1 NOT DETECTED NOT DETECTED Final   Coronavirus NL63 NOT DETECTED NOT DETECTED Final   Coronavirus OC43 NOT DETECTED NOT DETECTED Final   Metapneumovirus DETECTED (A) NOT DETECTED Final   Rhinovirus / Enterovirus NOT DETECTED NOT DETECTED Final   Influenza A NOT DETECTED NOT DETECTED Final   Influenza B NOT DETECTED NOT DETECTED Final   Parainfluenza Virus 1 NOT DETECTED NOT DETECTED Final   Parainfluenza Virus 2 NOT DETECTED NOT DETECTED Final   Parainfluenza Virus 3 NOT DETECTED NOT DETECTED Final   Parainfluenza Virus 4 NOT DETECTED NOT DETECTED Final   Respiratory Syncytial Virus NOT DETECTED NOT DETECTED Final   Bordetella pertussis NOT DETECTED NOT DETECTED Final   Chlamydophila pneumoniae NOT DETECTED NOT DETECTED Final   Mycoplasma pneumoniae NOT DETECTED NOT DETECTED Final  MRSA PCR Screening     Status: None   Collection Time: 08/09/16 11:48 AM  Result Value Ref Range Status   MRSA by PCR NEGATIVE NEGATIVE Final    Comment:        The GeneXpert MRSA Assay (FDA approved for NASAL specimens only), is one component of a comprehensive MRSA colonization surveillance program. It is not intended to diagnose MRSA infection nor to guide or monitor treatment for MRSA infections.   C difficile quick scan w PCR reflex     Status: None   Collection Time: 08/09/16  2:28 PM  Result Value Ref Range Status   C Diff antigen NEGATIVE NEGATIVE Final   C Diff toxin NEGATIVE NEGATIVE Final   C Diff  interpretation No C. difficile detected.  Final      Radiology Studies: Dg Chest Port 1 View  Result Date: 08/12/2016 CLINICAL DATA:  Chest pain, shortness of Breath EXAM: PORTABLE CHEST 1 VIEW COMPARISON:  08/10/2016 FINDINGS: Cardiomegaly. No confluent opacity on the right. Focal airspace opacity in the left lower lobe. No definite effusion or acute bony abnormality. IMPRESSION: Cardiomegaly. Left lower lobe opacity.  Cannot exclude pneumonia. Electronically Signed   By: Rolm Baptise M.D.   On: 08/12/2016 12:26     Medications:  Scheduled: . acyclovir  400 mg Oral Daily  . albuterol  2.5 mg Nebulization BID  . amitriptyline  75 mg Oral QHS  .  aspirin  81 mg Oral Daily  . atorvastatin  20 mg Oral q1800  . baclofen  10 mg Oral BID  . baclofen  15 mg Oral QHS  . bisoprolol  5 mg Oral Daily  . dantrolene  50 mg Oral TID AC & HS  . darifenacin  15 mg Oral Daily  . docusate sodium  100 mg Oral BID  . enoxaparin (LOVENOX) injection  40 mg Subcutaneous Daily  . feeding supplement (ENSURE ENLIVE)  237 mL Oral BID BM  . FLUoxetine  40 mg Oral Daily  . furosemide  40 mg Oral BID  . Gerhardt's butt cream   Topical BID  . guaiFENesin  1,200 mg Oral BID  . lamoTRIgine  200 mg Oral TID  . LORazepam  1 mg Oral Once  . mesalamine  1,000 mg Oral TID  . OXcarbazepine  150 mg Oral TID  . pantoprazole  40 mg Oral BID AC  . potassium chloride  40 mEq Oral Once  . Teriflunomide  1 tablet Oral Daily   Continuous:  TMA:[id number] **OR** acetaminophen, diphenoxylate-atropine, ondansetron **OR** ondansetron (ZOFRAN) IV, sodium chloride  Assessment/Plan:  Principal Problem:   Left lower lobe pneumonia (HCC) Active Problems:   Multiple sclerosis (HCC)   Spastic quadriparesis (HCC)   Depression   Hypokalemia   Normocytic anemia   Slurred speech   Ulcerative colitis ([place])   Essential hypertension   Acute respiratory failure with hypoxia (HCC)   CAP (community acquired  pneumonia)    Community-acquired Pneumonia: - Chest x-ray significant for right middle lobe, left lower lobe pneumonia, Treated for total of 7 days of  IV Rocephin and azithromycin . -  urine strep antigen negative - Respiratory panel positive for metapneumovirus virus - RT for chest physiotherapy, IS, And flutter valve given very poor cough mechanics - Pro calcitonin normalized, afebrile, no leukocytosis. Chest x-ray repeated today continues to show a left lower lobe opacity. Patient and husband notified of this finding and also told that this is not unusual. These findings may be present for up to 4-6 weeks.   Acute hypoxic respiratory failure - Secondary to pneumonia , chest x-ray with worsening pneumonia since admission, respiratory failure most likely due to pneumonia, and poor cough mechanics , pulmonary input appreciated, continue with her towel, chest PT and nebulizers . - Stable. Tends to need oxygen at nighttime.  Acute diastolic CHF/hypokalemia - 2-D echo with preserved EF but with grade 2 diastolic dysfunction, respiratory status improving with IV diuresis. Chest x-ray does not show any evidence of fluid overload. Continue oral diuretics. Replace potassium. Rise in creatinine noted. She was changed over from high-dose IV Lasix to oral Lasix yesterday evening. Creatinine should stabilize soon.  History of MS with quadriplegia - Continue Aubagio, dantrolene and baclofen - Continue amitriptyline and lamotrigine and Trileptal for dysesthesias - Continue Vesicare  Hypokalemia/hypophosphatemia - Repleted   Anemia: - Chronic, unchanged.  Slurred speech: - Suspect a metabolic encephalopathy from pneumonia in setting of MS.  - MRI brain with abnormal findings in basal ganglia, ischemic versus active demyelination. Neurology input greatly appreciated , concern for stroke versus tiny demyelinating plaque, stroke diagnosis is not certain. Started on aspirin, Lipitor, 2-D echo  with no embolic source, carotid Doppler with no significant ICA stenosis, PT/OT . - She will be discharged on aspirin and Lipitor. Discussed in detail with patient and her husband.  Ulcerative colitis: - Continue mesalamine  Depression: - Continue fluoxetine  Hypertension: - Continue labetalol  Other medications: -  Continue acyclovir  Diarrhea - C. difficile negative. Diarrhea has resolved. Hasn't had a BM in 3 days. Initiate low-dose stool softener.  DVT Prophylaxis: Lovenox    Code Status: Full code  Family Communication: Discussed with the patient and her husband  Disposition Plan: Management as outlined above. Await SNF placement.   LOS: 8 days   [facility] Pager [phone] 08/13/2016, 10:58 AM  If 7PM-7AM, please contact night-coverage at [email or web address], password [facility]

## 2016-08-14 NOTE — Clinical Social Work Note (Signed)
New referral sent with patient's new insurance information. New insurance information given to unit charge so that info could be sent to admissions. CSW will followup with patient and husband tomorrow regarding new bed offers. CSW has also submitted documentation to [employer] for completion of PASRR.  Liz Beach MSW, [place], [place], [phone]

## 2016-08-14 NOTE — Progress Notes (Signed)
[facility] PROGRESS NOTE  Andrea Ferrer LOV:[id number] DOB: 02/28/1966 DOA: 08/04/2016  PCP: [facility]  Brief History/Interval Summary: 51 y.o.femalewith a past medical history significant for multiple sclerosis with quadruplegiawho presented with 2 days cough as well as confusion and slurred speech, Workup significant for CAP, Transferred to stepdown on 12/25 giving respiratory distress and worsening hypoxia, Chest x-ray with worsening bilateral infiltrate, worsening pneumonia versus volume overload, 2-D echo with diastolic dysfunction, respiratory status improved with IV diuresis, respiratory panel significant for metapneumovirus.  Reason for Visit: Pneumonia.  Consultants: None  Procedures:  Transthoracic echocardiogram Study Conclusions  - Left ventricle: The cavity size was normal. There was moderate   concentric hypertrophy. Systolic function was normal. The   estimated ejection fraction was in the range of 55% to 60%. Wall   motion was normal; there were no regional wall motion   abnormalities. Features are consistent with a pseudonormal left   ventricular filling pattern, with concomitant abnormal relaxation   and increased filling pressure (grade 2 diastolic dysfunction).   Doppler parameters are consistent with indeterminate ventricular   filling pressure. - Aortic valve: Transvalvular velocity was within the normal range.   There was no stenosis. There was no regurgitation. - Mitral valve: Transvalvular velocity was within the normal range.   There was no evidence for stenosis. There was trivial   regurgitation. - Left atrium: The atrium was mildly dilated. - Right ventricle: The cavity size was normal. Wall thickness was   normal. Systolic function was normal. - Atrial septum: No defect or patent foramen ovale was identified   by color flow Doppler. - Tricuspid valve: There was trivial regurgitation. - Pulmonary  arteries: Systolic pressure was within the normal   range. PA peak pressure: 31 mm Hg (S).   Antibiotics: Patient finished a course of antibiotics  Subjective/Interval History: Patient denies any complaints this morning. Cough is improved. No chest pain.   ROS: Some nausea overnight. None currently. No vomiting.  Objective:  Vital Signs  Vitals:   08/13/16 2033 08/13/16 2105 08/13/16 2209 08/14/16 0503  BP:   120/66   Pulse:   77   Resp:  19 19   Temp:      TempSrc:      SpO2: 91% 95% 97%   Weight:    81.6 kg (180 lb)  Height:        Intake/Output Summary (Last 24 hours) at 08/14/16 1013 Last data filed at 08/14/16 0900  Gross per 24 hour  Intake              837 ml  Output                0 ml  Net              837 ml   Filed Weights   08/12/16 0500 08/13/16 0500 08/14/16 0503  Weight: 76.9 kg (169 lb 9.6 oz) 77.2 kg (170 lb 1.6 oz) 81.6 kg (180 lb)    General appearance: alert, cooperative, appears stated age and no distress Resp: Coarse breath sounds bilaterally. No significant wheezing, crackles or rhonchi. Cardio: regular rate and rhythm, S1, S2 normal, no murmur, click, rub or gallop GI: soft, non-tender; bowel sounds normal; no masses,  no organomegaly Neurologic: Patient with history of MS. Has baseline neurological deficits  Lab Results:  Data Reviewed: I have personally reviewed following labs and imaging studies  CBC:  Recent Labs Lab 08/08/16 0459 08/09/16 0500 08/13/16 8841  WBC 5.4 5.4 7.8  HGB 10.0* 11.1* 10.5*  HCT 31.1* 35.2* 32.4*  MCV 100.0 99.2 97.9  PLT 158 161 240    Basic Metabolic Panel:  Recent Labs Lab 08/08/16 0459 08/09/16 0500 08/10/16 0438 08/11/16 0528 08/12/16 0458 08/13/16 0726  NA 137 138 134* 137  --  138  K 3.5 3.7 3.4* 3.5  --  3.0*  CL 98* 95* 92* 96*  --  94*  CO2 26 30 29 29   --  35*  GLUCOSE 96 87 87 116*  --  114*  BUN 11 13 16 16   --  23*  CREATININE 0.89 1.02* 0.97 1.07* 1.11* 1.18*  CALCIUM  7.8* 8.0* 8.3* 8.3*  --  9.4    GFR: Estimated Creatinine Clearance: 57.1 mL/min (by C-G formula based on SCr of 1.18 mg/dL (H)).   Recent Results (from the past 240 hour(s))  Respiratory Panel by PCR     Status: Abnormal   Collection Time: 08/08/16  3:12 PM  Result Value Ref Range Status   Adenovirus NOT DETECTED NOT DETECTED Final   Coronavirus 229E NOT DETECTED NOT DETECTED Final   Coronavirus HKU1 NOT DETECTED NOT DETECTED Final   Coronavirus NL63 NOT DETECTED NOT DETECTED Final   Coronavirus OC43 NOT DETECTED NOT DETECTED Final   Metapneumovirus DETECTED (A) NOT DETECTED Final   Rhinovirus / Enterovirus NOT DETECTED NOT DETECTED Final   Influenza A NOT DETECTED NOT DETECTED Final   Influenza B NOT DETECTED NOT DETECTED Final   Parainfluenza Virus 1 NOT DETECTED NOT DETECTED Final   Parainfluenza Virus 2 NOT DETECTED NOT DETECTED Final   Parainfluenza Virus 3 NOT DETECTED NOT DETECTED Final   Parainfluenza Virus 4 NOT DETECTED NOT DETECTED Final   Respiratory Syncytial Virus NOT DETECTED NOT DETECTED Final   Bordetella pertussis NOT DETECTED NOT DETECTED Final   Chlamydophila pneumoniae NOT DETECTED NOT DETECTED Final   Mycoplasma pneumoniae NOT DETECTED NOT DETECTED Final  MRSA PCR Screening     Status: None   Collection Time: 08/09/16 11:48 AM  Result Value Ref Range Status   MRSA by PCR NEGATIVE NEGATIVE Final    Comment:        The GeneXpert MRSA Assay (FDA approved for NASAL specimens only), is one component of a comprehensive MRSA colonization surveillance program. It is not intended to diagnose MRSA infection nor to guide or monitor treatment for MRSA infections.   C difficile quick scan w PCR reflex     Status: None   Collection Time: 08/09/16  2:28 PM  Result Value Ref Range Status   C Diff antigen NEGATIVE NEGATIVE Final   C Diff toxin NEGATIVE NEGATIVE Final   C Diff interpretation No C. difficile detected.  Final      Radiology Studies: Dg Chest  Port 1 View  Result Date: 08/12/2016 CLINICAL DATA:  Chest pain, shortness of Breath EXAM: PORTABLE CHEST 1 VIEW COMPARISON:  08/10/2016 FINDINGS: Cardiomegaly. No confluent opacity on the right. Focal airspace opacity in the left lower lobe. No definite effusion or acute bony abnormality. IMPRESSION: Cardiomegaly. Left lower lobe opacity.  Cannot exclude pneumonia. Electronically Signed   By: Rolm Baptise M.D.   On: 08/12/2016 12:26     Medications:  Scheduled: . acyclovir  400 mg Oral Daily  . albuterol  2.5 mg Nebulization BID  . amitriptyline  75 mg Oral QHS  . aspirin  81 mg Oral Daily  . atorvastatin  20 mg Oral q1800  . baclofen  10 mg Oral BID  . baclofen  15 mg Oral QHS  . bisoprolol  5 mg Oral Daily  . dantrolene  50 mg Oral TID AC & HS  . darifenacin  15 mg Oral Daily  . docusate sodium  100 mg Oral BID  . enoxaparin (LOVENOX) injection  40 mg Subcutaneous Daily  . feeding supplement (ENSURE ENLIVE)  237 mL Oral BID BM  . FLUoxetine  40 mg Oral Daily  . furosemide  40 mg Oral BID  . Gerhardt's butt cream   Topical BID  . guaiFENesin  1,200 mg Oral BID  . lamoTRIgine  200 mg Oral TID  . LORazepam  1 mg Oral Once  . mesalamine  1,000 mg Oral TID  . OXcarbazepine  150 mg Oral TID  . pantoprazole  40 mg Oral BID AC  . Teriflunomide  1 tablet Oral Daily   Continuous:  HGD:[id number] **OR** acetaminophen, diphenoxylate-atropine, ondansetron **OR** ondansetron (ZOFRAN) IV, sodium chloride  Assessment/Plan:  Principal Problem:   Left lower lobe pneumonia (HCC) Active Problems:   Multiple sclerosis (HCC)   Spastic quadriparesis (HCC)   Depression   Hypokalemia   Normocytic anemia   Slurred speech   Ulcerative colitis ([place])   Essential hypertension   Acute respiratory failure with hypoxia (HCC)   CAP (community acquired pneumonia)    Community-acquired Pneumonia - Chest x-ray was significant for right middle lobe, left lower lobe pneumonia, Treated for  total of 7 days of  IV Rocephin and azithromycin . -  urine strep antigen negative - Respiratory panel positive for metapneumovirus virus - RT for chest physiotherapy, IS, And flutter valve given very poor cough mechanics - Pro calcitonin normalized, afebrile, no leukocytosis. Chest x-ray repeated 12/30 continues to show a left lower lobe opacity. Patient and husband notified of this finding and also told that it is not unusual for these findings to be present for a few weeks..   Acute hypoxic respiratory failure - Secondary to pneumonia , chest x-ray with worsening pneumonia since admission, respiratory failure most likely due to pneumonia, and poor cough mechanics , pulmonary input appreciated, continue with her towel, chest PT and nebulizers . - Stable. Tends to occasionally need oxygen at nighttime.  Acute diastolic CHF/hypokalemia - 2-D echo with preserved EF but with grade 2 diastolic dysfunction, respiratory status improving with IV diuresis. Chest x-ray does not show any evidence of fluid overload. Continue oral diuretics. Replace potassium. Rise in creatinine noted. She was changed over from high-dose IV Lasix to oral Lasix yesterday evening. Creatinine should stabilize soon. Repeat labs tomorrow.  History of MS with quadriplegia - Continue Aubagio, dantrolene and baclofen - Continue amitriptyline and lamotrigine and Trileptal for dysesthesias - Continue Vesicare  Hypokalemia/hypophosphatemia - Repleted. Recheck tomorrow.  Anemia: - Chronic, unchanged.  Slurred speech: - Suspect a metabolic encephalopathy from pneumonia in setting of MS.  - MRI brain with abnormal findings in basal ganglia, ischemic versus active demyelination. Neurology input greatly appreciated, concern for stroke versus tiny demyelinating plaque, stroke diagnosis is not certain. Started on aspirin, Lipitor, 2-D echo with no embolic source, carotid Doppler with no significant ICA stenosis, PT/OT . - She  will be discharged on aspirin and Lipitor. Discussed in detail with patient and her husband.  Ulcerative colitis: - Continue mesalamine. Some nausea overnight but no vomiting. Abdomen is benign. Continue to monitor for now.  Depression: - Continue fluoxetine  Hypertension: - Continue labetalol  Other medications: - Continue acyclovir  Diarrhea - C. difficile  negative. Diarrhea has resolved. Hasn't had a BM in few days. Initiated stool softener.  DVT Prophylaxis: Lovenox    Code Status: Full code  Family Communication: Discussed with the patient and her husband  Disposition Plan: Management as outlined above. Await SNF placement. Anticipate discharge tomorrow.   LOS: 9 days   [place] [facility] Pager [phone] 08/14/2016, 10:13 AM  If 7PM-7AM, please contact night-coverage at [email or web address], password [facility]

## 2016-08-15 ENCOUNTER — Inpatient Hospital Stay (HOSPITAL_COMMUNITY): Payer: Managed Care, Other (non HMO)

## 2016-08-15 DIAGNOSIS — R4182 Altered mental status, unspecified: Secondary | ICD-10-CM

## 2016-08-15 LAB — CBC
HEMATOCRIT: 34.2 % — AB (ref 36.0–46.0)
Hemoglobin: 10.8 g/dL — ABNORMAL LOW (ref 12.0–15.0)
MCH: 31.7 pg (ref 26.0–34.0)
MCHC: 31.6 g/dL (ref 30.0–36.0)
MCV: 100.3 fL — AB (ref 78.0–100.0)
Platelets: 218 10*3/uL (ref 150–400)
RBC: 3.41 MIL/uL — ABNORMAL LOW (ref 3.87–5.11)
RDW: 14.2 % (ref 11.5–15.5)
WBC: 8 10*3/uL (ref 4.0–10.5)

## 2016-08-15 LAB — BASIC METABOLIC PANEL
Anion gap: 14 (ref 5–15)
BUN: 23 mg/dL — AB (ref 6–20)
CHLORIDE: 98 mmol/L — AB (ref 101–111)
CO2: 28 mmol/L (ref 22–32)
Calcium: 9.2 mg/dL (ref 8.9–10.3)
Creatinine, Ser: 1.12 mg/dL — ABNORMAL HIGH (ref 0.44–1.00)
GFR calc Af Amer: >60 mL/min (ref >60)
GFR calc non Af Amer: 56 mL/min — ABNORMAL LOW (ref >60)
GLUCOSE: 101 mg/dL — AB (ref 65–99)
POTASSIUM: 3.8 mmol/L (ref 3.5–5.1)
Sodium: 140 mmol/L (ref 135–145)

## 2016-08-15 LAB — GLUCOSE, CAPILLARY: GLUCOSE-CAPILLARY: 99 mg/dL (ref 65–99)

## 2016-08-15 MED ORDER — LORAZEPAM 2 MG/ML IJ SOLN
0.5000 mg | Freq: Once | INTRAMUSCULAR | Status: AC
  Administered 2016-08-15: 0.5 mg via INTRAVENOUS
  Filled 2016-08-15: qty 1

## 2016-08-15 MED ORDER — OXYCODONE-ACETAMINOPHEN 5-325 MG PO TABS
2.0000 | ORAL_TABLET | Freq: Once | ORAL | Status: AC
  Administered 2016-08-15: 2 via ORAL
  Filled 2016-08-15: qty 2

## 2016-08-15 NOTE — Progress Notes (Signed)
Pt refuses chest vest at this time.

## 2016-08-15 NOTE — Progress Notes (Signed)
pts husband is a bedside.  He states patient is in a lot of pain.  Chest vest is not administered at this time.

## 2016-08-15 NOTE — Progress Notes (Signed)
PT Cancellation Note  Patient Details Name: Vanessa Short MRN: [id number] DOB: 1966-04-02   Cancelled Treatment:    Reason Eval/Treat Not Completed: Patient at procedure or test/unavailable.  Pt to CT.  Will try another time.     Thornton Papas Ellory Khurana 08/15/2016, 12:18 PM

## 2016-08-15 NOTE — Clinical Social Work Note (Signed)
[facility] has offered the patient a bed under the condition the patient's insurance approves her for placement. CSW explained this to the husband in patient's room. CSW explained that the facility has also stated that the patient will have to bring in her medications for her MS due to cost. Husband verbalized understanding of this information and states this will not be a problem. Handoff report will be left for covering CSW.  Liz Beach MSW, [place], [place], [phone]

## 2016-08-15 NOTE — Progress Notes (Signed)
Pt states relief from ativan given and appears less anxious at this time. Husband into room at bedside.

## 2016-08-15 NOTE — Progress Notes (Signed)
EEG completed, results pending. 

## 2016-08-15 NOTE — Progress Notes (Addendum)
[facility] PROGRESS NOTE  Vanessa Short MBT:[id number] DOB: 12-27-1965 DOA: 08/04/2016  PCP: [facility]  Brief History/Interval Summary: 51 y.o.femalewith a past medical history significant for multiple sclerosis with quadruplegiawho presented with 2 days cough as well as confusion and slurred speech, Workup significant for CAP, Transferred to stepdown on 12/25 giving respiratory distress and worsening hypoxia, Chest x-ray with worsening bilateral infiltrate, worsening pneumonia versus volume overload, 2-D echo with diastolic dysfunction, respiratory status improved with IV diuresis, respiratory panel significant for metapneumovirus. Patient was seen by physical therapy. It was felt that she would benefit from short-term rehabilitation. She was waiting on a skilled nursing facility bed. Overnight and this morning patient has become more agitated and has had a change in mental status. She is crying. This will need further evaluation.  Reason for Visit: Pneumonia.  Consultants: None  Procedures:  Transthoracic echocardiogram Study Conclusions  - Left ventricle: The cavity size was normal. There was moderate   concentric hypertrophy. Systolic function was normal. The   estimated ejection fraction was in the range of 55% to 60%. Wall   motion was normal; there were no regional wall motion   abnormalities. Features are consistent with a pseudonormal left   ventricular filling pattern, with concomitant abnormal relaxation   and increased filling pressure (grade 2 diastolic dysfunction).   Doppler parameters are consistent with indeterminate ventricular   filling pressure. - Aortic valve: Transvalvular velocity was within the normal range.   There was no stenosis. There was no regurgitation. - Mitral valve: Transvalvular velocity was within the normal range.   There was no evidence for stenosis. There was trivial   regurgitation. - Left atrium:  The atrium was mildly dilated. - Right ventricle: The cavity size was normal. Wall thickness was   normal. Systolic function was normal. - Atrial septum: No defect or patent foramen ovale was identified   by color flow Doppler. - Tricuspid valve: There was trivial regurgitation. - Pulmonary arteries: Systolic pressure was within the normal   range. PA peak pressure: 31 mm Hg (S).   Antibiotics: Patient finished a course of antibiotics  Subjective/Interval History: Husband is at the bedside. Informed by the nurse that the patient was crying and saying that she was in pain. Patient unable to answer my questions. She has her eyes closed, but she is crying. She keeps repeating that "she wants to go". She is unable to tell me where her pain is located. According to the husband, this is an entirely new presentation for her.   ROS: Unable to do.  Objective:  Vital Signs  Vitals:   08/14/16 2106 08/15/16 0700 08/15/16 0837 08/15/16 0942  BP: (!) 146/79 120/69  132/82  Pulse: 86 94  93  Resp: 16 (!) 21  (!) 21  Temp: 98.6 F (37 C) 99 F (37.2 C)  100 F (37.8 C)  TempSrc: Oral Axillary  Axillary  SpO2: 100% 96% 94% 91%  Weight:  78.7 kg (173 lb 6.4 oz)    Height:        Intake/Output Summary (Last 24 hours) at 08/15/16 1130 Last data filed at 08/15/16 0747  Gross per 24 hour  Intake              720 ml  Output              100 ml  Net              620 ml   Filed  Weights   08/13/16 0500 08/14/16 0503 08/15/16 0700  Weight: 77.2 kg (170 lb 1.6 oz) 81.6 kg (180 lb) 78.7 kg (173 lb 6.4 oz)    General appearance: Agitated, crying. no distress otherwise Resp: Coarse breath sounds bilaterally. No significant wheezing, crackles or rhonchi. Cardio: regular rate and rhythm, S1, S2 normal, no murmur, click, rub or gallop GI: soft, non-tender; bowel sounds normal; no masses,  no organomegaly Neurologic: Patient with history of MS. Has baseline neurological deficits. Seems  agitated.  Lab Results:  Data Reviewed: I have personally reviewed following labs and imaging studies  CBC:  Recent Labs Lab 08/09/16 0500 08/13/16 0726  WBC 5.4 7.8  HGB 11.1* 10.5*  HCT 35.2* 32.4*  MCV 99.2 97.9  PLT 161 213    Basic Metabolic Panel:  Recent Labs Lab 08/09/16 0500 08/10/16 0438 08/11/16 0528 08/12/16 0458 08/13/16 0726  NA 138 134* 137  --  138  K 3.7 3.4* 3.5  --  3.0*  CL 95* 92* 96*  --  94*  CO2 30 29 29   --  35*  GLUCOSE 87 87 116*  --  114*  BUN 13 16 16   --  23*  CREATININE 1.02* 0.97 1.07* 1.11* 1.18*  CALCIUM 8.0* 8.3* 8.3*  --  9.4    GFR: Estimated Creatinine Clearance: 56 mL/min (by C-G formula based on SCr of 1.18 mg/dL (H)).   Recent Results (from the past 240 hour(s))  Respiratory Panel by PCR     Status: Abnormal   Collection Time: 08/08/16  3:12 PM  Result Value Ref Range Status   Adenovirus NOT DETECTED NOT DETECTED Final   Coronavirus 229E NOT DETECTED NOT DETECTED Final   Coronavirus HKU1 NOT DETECTED NOT DETECTED Final   Coronavirus NL63 NOT DETECTED NOT DETECTED Final   Coronavirus OC43 NOT DETECTED NOT DETECTED Final   Metapneumovirus DETECTED (A) NOT DETECTED Final   Rhinovirus / Enterovirus NOT DETECTED NOT DETECTED Final   Influenza A NOT DETECTED NOT DETECTED Final   Influenza B NOT DETECTED NOT DETECTED Final   Parainfluenza Virus 1 NOT DETECTED NOT DETECTED Final   Parainfluenza Virus 2 NOT DETECTED NOT DETECTED Final   Parainfluenza Virus 3 NOT DETECTED NOT DETECTED Final   Parainfluenza Virus 4 NOT DETECTED NOT DETECTED Final   Respiratory Syncytial Virus NOT DETECTED NOT DETECTED Final   Bordetella pertussis NOT DETECTED NOT DETECTED Final   Chlamydophila pneumoniae NOT DETECTED NOT DETECTED Final   Mycoplasma pneumoniae NOT DETECTED NOT DETECTED Final  MRSA PCR Screening     Status: None   Collection Time: 08/09/16 11:48 AM  Result Value Ref Range Status   MRSA by PCR NEGATIVE NEGATIVE Final     Comment:        The GeneXpert MRSA Assay (FDA approved for NASAL specimens only), is one component of a comprehensive MRSA colonization surveillance program. It is not intended to diagnose MRSA infection nor to guide or monitor treatment for MRSA infections.   C difficile quick scan w PCR reflex     Status: None   Collection Time: 08/09/16  2:28 PM  Result Value Ref Range Status   C Diff antigen NEGATIVE NEGATIVE Final   C Diff toxin NEGATIVE NEGATIVE Final   C Diff interpretation No C. difficile detected.  Final      Radiology Studies: Dg Chest Port 1 View  Result Date: 08/15/2016 CLINICAL DATA:  Cough and fever.  Left lower lobe pneumonia. EXAM: PORTABLE CHEST 1 VIEW COMPARISON:  08/12/2016 FINDINGS: There has been slight improvement in the left lower lobe consolidation since the prior study. Right lung remains clear. Heart size and vascularity are normal. No acute bone abnormality. IMPRESSION: Partial clearing of the left lower lobe pneumonia. Electronically Signed   By: Lorriane Shire M.D.   On: 08/15/2016 10:59     Medications:  Scheduled: . acyclovir  400 mg Oral Daily  . albuterol  2.5 mg Nebulization BID  . amitriptyline  75 mg Oral QHS  . aspirin  81 mg Oral Daily  . atorvastatin  20 mg Oral q1800  . baclofen  10 mg Oral BID  . baclofen  15 mg Oral QHS  . bisoprolol  5 mg Oral Daily  . dantrolene  50 mg Oral TID AC & HS  . darifenacin  15 mg Oral Daily  . docusate sodium  100 mg Oral BID  . enoxaparin (LOVENOX) injection  40 mg Subcutaneous Daily  . feeding supplement (ENSURE ENLIVE)  237 mL Oral BID BM  . FLUoxetine  40 mg Oral Daily  . furosemide  40 mg Oral BID  . Gerhardt's butt cream   Topical BID  . guaiFENesin  1,200 mg Oral BID  . lamoTRIgine  200 mg Oral TID  . LORazepam  1 mg Oral Once  . mesalamine  1,000 mg Oral TID  . OXcarbazepine  150 mg Oral TID  . pantoprazole  40 mg Oral BID AC  . Teriflunomide  1 tablet Oral Daily    Continuous:  FAO:ZHYQMVHQIONGE **OR** acetaminophen, diphenoxylate-atropine, ondansetron **OR** ondansetron (ZOFRAN) IV, sodium chloride  Assessment/Plan:  Principal Problem:   Left lower lobe pneumonia (HCC) Active Problems:   Multiple sclerosis (HCC)   Spastic quadriparesis (HCC)   Depression   Hypokalemia   Normocytic anemia   Slurred speech   Ulcerative colitis ([place])   Essential hypertension   Acute respiratory failure with hypoxia ([place])   CAP (community acquired pneumonia)    Acute encephalopathy Etiology for this change in her mentation and agitation is not entirely clear. She has not been started on any new medications. She does have a low-grade fever. We will order CT head. Also get chest x-ray. Check her labs. May need to involve neurology as this could be related to her MS. CT scan report reviewed. Discussed with neurology. Presentation is unlikely to be related to MS. EEG will be ordered. Psychiatry consultation will be obtained. Blood work is still pending.  Community-acquired Pneumonia - Chest x-ray was significant for right middle lobe, left lower lobe pneumonia, Treated for total of 7 days of  IV Rocephin and azithromycin . -  urine strep antigennegative - Respiratory panel positive for metapneumovirus virus - RT for chest physiotherapy, IS, And flutter valve given very poor cough mechanics - Pro calcitonin normalized, afebrile, no leukocytosis. Chest x-ray repeated 12/30 continues to show a left lower lobe opacity. Patient and husband notified of this finding and also told that it is not unusual for these findings to be present for a few weeks..  Chest x-ray to be repeated today as discussed above.  Acute hypoxic respiratory failure - Secondary to pneumonia , chest x-ray with worsening pneumonia since admission, respiratory failure most likely due to pneumonia, and poor cough mechanics , pulmonary input appreciated, continue with her towel, chest PT and  nebulizers . - Stable. Tends to occasionally need oxygen at nighttime.  Acute diastolic CHF/hypokalemia - 2-D echo with preserved EF but with grade 2 diastolic dysfunction, respiratory status improving with  IV diuresis. Chest x-ray does not show any evidence of fluid overload. Continue oral diuretics. Replace potassium. Rise in creatinine noted. She was changed over from high-dose IV Lasix to oral Lasix . Labs pending from this morning.   History of MS with quadriplegia - Continue Aubagio, dantrolene and baclofen - Continue amitriptyline and lamotrigine and Trileptal for dysesthesias - Continue Vesicare  Hypokalemia/hypophosphatemia - Repleted. Labs pending today.  Anemia: - Chronic, unchanged.  Slurred speech - Suspect a metabolic encephalopathy from pneumonia in setting of MS.  - MRI brain with abnormal findings in basal ganglia, ischemic versus active demyelination. Neurology was consulted. Concern for stroke versus tiny demyelinating plaque, stroke diagnosis is not certain. Started on aspirin, Lipitor, 2-D echo with no embolic source, carotid Doppler with no significant ICA stenosis, PT/OT . - She will need to be discharged on aspirin and Lipitor.  Ulcerative colitis - Continue mesalamine. Continue to monitor for now.  Depression - Continue fluoxetine  Essential Hypertension - Continue labetalol  Other medications: - Continue acyclovir  Diarrhea - C. difficile negative. Diarrhea has resolved. Hasn't had a BM in few days. Initiated stool softener.  DVT Prophylaxis: Lovenox    Code Status: Full code  Family Communication: Discussed with her husband  Disposition Plan: Management as outlined above. Await workup for her change in mental status as discussed above.   LOS: 10 days   [facility] Pager [phone] 08/15/2016, 11:30 AM  If 7PM-7AM, please contact night-coverage at [email or web address], password [email or web address]

## 2016-08-15 NOTE — Progress Notes (Signed)
Pt very anxious and c/o hurting all over. Pt crying and very agitated. Dr. [place] Pink into room. Ativan ordered and given at this time along with Tylenol and Baclofen for back spasms.

## 2016-08-15 NOTE — Procedures (Signed)
ELECTROENCEPHALOGRAM REPORT  Date of Study: 08/15/2016  Patient's Name: Vanessa Short MRN: [id number] Date of Birth: [birth year]  Referring Provider: Dr. Bonnielee Haff  Clinical History: This is a 51 year old woman with confusion and slurred speech.  Medications: Oxcarbazepine (TRILEPTAL) tablet 150 mg  acetaminophen (TYLENOL) tablet 650 mg  acyclovir (ZOVIRAX) tablet 400 mg  albuterol (PROVENTIL) (2.5 MG/3ML) 0.083% nebulizer solution 2.5 mg  amitriptyline (ELAVIL) tablet 75 mg  aspirin chewable tablet 81 mg  atorvastatin (LIPITOR) tablet 20 mg  baclofen (LIORESAL) tablet 10 mg  baclofen (LIORESAL) tablet 15 mg  bisoprolol (ZEBETA) tablet 5 mg  dantrolene (DANTRIUM) capsule 50 mg  darifenacin (ENABLEX) 24 hr tablet 15 mg  diphenoxylate-atropine (LOMOTIL) 2.5-0.025 MG per tablet 2 tablet  docusate sodium (COLACE) capsule 100 mg  enoxaparin (LOVENOX) injection 40 mg  feeding supplement (ENSURE ENLIVE) (ENSURE ENLIVE) liquid 237 mL  FLUoxetine (PROZAC) capsule 40 mg  furosemide (LASIX) tablet 40 mg  Gerhardt's butt cream  guaiFENesin (MUCINEX) 12 hr tablet 1,200 mg  lamoTRIgine (LAMICTAL) tablet 200 mg  LORazepam (ATIVAN) tablet 1 mg  mesalamine (PENTASA) CR capsule 1,000 mg  pantoprazole (PROTONIX) EC tablet 40 mg  Teriflunomide TABS 1 tablet   Technical Summary: A multichannel digital EEG recording measured by the international 10-20 system with electrodes applied with paste and impedances below 5000 ohms performed as portable with EKG monitoring in an awake and drowsy patient.  Hyperventilation and photic stimulation were not performed.  The digital EEG was referentially recorded, reformatted, and digitally filtered in a variety of bipolar and referential montages for optimal display.   Description: The patient is awake and drowsy during the recording. She is poorly responsive. There is no clear posterior dominant rhythm. The background is disorganized with a large amount  of diffuse 4-5 Hz theta and occasional diffuse 2-3 Hz delta slowing, at times with triphasic-like appearance.  During drowsiness, there is an increase in theta and delta slowing of the background. Normal sleep architecture is not seen. Hyperventilation and photic stimulation were not performed. There were no epileptiform discharges or electrographic seizures seen.    EKG lead was unremarkable.  Impression: This awake and drowsy EEG is abnormal due to moderate diffuse slowing of the background, at times with triphasic-like appearance.  Clinical Correlation of the above findings indicates diffuse cerebral dysfunction that is non-specific in etiology and can be seen with hypoxic/ischemic injury, toxic/metabolic encephalopathies, or medication effect. Triphasic-like waves are typically seen with hepatic encephalopathy, but can be seen with other metabolic encephalopathies as well.  The absence of epileptiform discharges does not rule out a clinical diagnosis of epilepsy.  Clinical correlation is advised.   Ellouise Newer, M.D.

## 2016-08-16 ENCOUNTER — Telehealth: Payer: Self-pay | Admitting: Neurology

## 2016-08-16 ENCOUNTER — Encounter (HOSPITAL_COMMUNITY): Payer: Self-pay | Admitting: Radiology

## 2016-08-16 ENCOUNTER — Ambulatory Visit: Payer: Managed Care, Other (non HMO) | Admitting: Neurology

## 2016-08-16 ENCOUNTER — Inpatient Hospital Stay (HOSPITAL_COMMUNITY): Payer: Managed Care, Other (non HMO)

## 2016-08-16 DIAGNOSIS — Z79899 Other long term (current) drug therapy: Secondary | ICD-10-CM

## 2016-08-16 DIAGNOSIS — Z9889 Other specified postprocedural states: Secondary | ICD-10-CM

## 2016-08-16 DIAGNOSIS — F332 Major depressive disorder, recurrent severe without psychotic features: Secondary | ICD-10-CM | POA: Diagnosis present

## 2016-08-16 DIAGNOSIS — Z810 Family history of intellectual disabilities: Secondary | ICD-10-CM

## 2016-08-16 DIAGNOSIS — Z880 Allergy status to penicillin: Secondary | ICD-10-CM

## 2016-08-16 DIAGNOSIS — R4182 Altered mental status, unspecified: Secondary | ICD-10-CM

## 2016-08-16 DIAGNOSIS — Z79891 Long term (current) use of opiate analgesic: Secondary | ICD-10-CM

## 2016-08-16 DIAGNOSIS — Z7982 Long term (current) use of aspirin: Secondary | ICD-10-CM

## 2016-08-16 DIAGNOSIS — Z833 Family history of diabetes mellitus: Secondary | ICD-10-CM

## 2016-08-16 DIAGNOSIS — Z8249 Family history of ischemic heart disease and other diseases of the circulatory system: Secondary | ICD-10-CM

## 2016-08-16 LAB — BASIC METABOLIC PANEL
ANION GAP: 12 (ref 5–15)
BUN: 30 mg/dL — ABNORMAL HIGH (ref 6–20)
CALCIUM: 9.6 mg/dL (ref 8.9–10.3)
CO2: 33 mmol/L — AB (ref 22–32)
CREATININE: 1.17 mg/dL — AB (ref 0.44–1.00)
Chloride: 95 mmol/L — ABNORMAL LOW (ref 101–111)
GFR, EST NON AFRICAN AMERICAN: 53 mL/min — AB (ref >60)
Glucose, Bld: 111 mg/dL — ABNORMAL HIGH (ref 65–99)
Potassium: 4 mmol/L (ref 3.5–5.1)
SODIUM: 140 mmol/L (ref 135–145)

## 2016-08-16 LAB — CBC
HEMATOCRIT: 35.2 % — AB (ref 36.0–46.0)
Hemoglobin: 11 g/dL — ABNORMAL LOW (ref 12.0–15.0)
MCH: 31.5 pg (ref 26.0–34.0)
MCHC: 31.3 g/dL (ref 30.0–36.0)
MCV: 100.9 fL — ABNORMAL HIGH (ref 78.0–100.0)
PLATELETS: 226 10*3/uL (ref 150–400)
RBC: 3.49 MIL/uL — ABNORMAL LOW (ref 3.87–5.11)
RDW: 14.5 % (ref 11.5–15.5)
WBC: 8 10*3/uL (ref 4.0–10.5)

## 2016-08-16 MED ORDER — LORAZEPAM 0.5 MG PO TABS: 0.5000 mg | ORAL_TABLET | Freq: Every day | ORAL | 0 refills | Status: DC | PRN

## 2016-08-16 MED ORDER — OXYCODONE-ACETAMINOPHEN 5-325 MG PO TABS
1.0000 | ORAL_TABLET | ORAL | Status: DC | PRN
  Administered 2016-08-16: 2 via ORAL
  Administered 2016-08-16 – 2016-08-19 (×4): 1 via ORAL
  Filled 2016-08-16: qty 1
  Filled 2016-08-16: qty 2
  Filled 2016-08-16 (×3): qty 1

## 2016-08-16 MED ORDER — LORAZEPAM 0.5 MG PO TABS
0.5000 mg | ORAL_TABLET | Freq: Every day | ORAL | Status: DC | PRN
  Administered 2016-08-16: 0.5 mg via ORAL
  Filled 2016-08-16 (×2): qty 1

## 2016-08-16 MED ORDER — POLYETHYLENE GLYCOL 3350 17 G PO PACK
17.0000 g | PACK | Freq: Every day | ORAL | Status: DC
  Administered 2016-08-16 – 2016-08-18 (×3): 17 g via ORAL
  Filled 2016-08-16 (×4): qty 1

## 2016-08-16 MED ORDER — OXYCODONE-ACETAMINOPHEN 5-325 MG PO TABS: 1.0000 | ORAL_TABLET | Freq: Four times a day (QID) | ORAL | 0 refills | Status: DC | PRN

## 2016-08-16 NOTE — Telephone Encounter (Signed)
LMTC./fim 

## 2016-08-16 NOTE — Progress Notes (Signed)
Physical Therapy Treatment Patient Details Name: Vanessa Short MRN: [id number] DOB: 10-23-1965 Today's Date: 08/16/2016    History of Present Illness 51 y.o. female with a past medical history significant for multiple sclerosis with quadruplegia who presents with 2 days cough as well as confusion and slurred speech, Workup significant for CAP    PT Comments    Pt's husband reports pt has been in more pain yesterday/today and demos increased lethargy.  Pt tolerated PROM and stretching to all 4 extremities in all planes with noted grimacing only with L hand/wrist movement.  Pt positioned in R semi-side lying at end of session to relieve pressure on back/bottom.  PT educated husband and caregiver on positioning in bed and in w/c (once pt able to tolerate) to reduce risk of skin breakdown.     Follow Up Recommendations  SNF;Supervision/Assistance - 24 hour     Equipment Recommendations   (to be determined at next level of care)    Recommendations for Other Services       Precautions / Restrictions Precautions Precautions: Fall Restrictions Weight Bearing Restrictions: No    Mobility  Bed Mobility Overal bed mobility: Needs Assistance Bed Mobility: Rolling Rolling: +2 for physical assistance;Total assist         General bed mobility comments: pt does not participate in rolling or positioning in bed  Transfers                 General transfer comment: not safe at this time given pt's decreased arousal  Ambulation/Gait                 Stairs            Wheelchair Mobility    Modified Rankin (Stroke Patients Only)       Balance                                    Cognition Arousal/Alertness: Lethargic Behavior During Therapy: Flat affect Overall Cognitive Status: History of cognitive impairments - at baseline     Current Attention Level: Focused   Following Commands: Follows one step commands inconsistently Safety/Judgement:  Decreased awareness of deficits;Decreased awareness of safety Awareness: Intellectual Problem Solving: Slow processing;Requires verbal cues;Requires tactile cues      Exercises General Exercises - Upper Extremity Shoulder Flexion: PROM;10 reps;Both;Supine Shoulder Horizontal ABduction: PROM;Both;10 reps;Supine Shoulder Horizontal ADduction: PROM;Both;10 reps;Supine Elbow Flexion: PROM;Both;10 reps;Supine Elbow Extension: PROM;Both;10 reps;Supine Wrist Flexion: PROM;Both;10 reps;Supine Wrist Extension: PROM;Both;10 reps;Supine Digit Composite Flexion: PROM;Both;10 reps;Supine Composite Extension: PROM;Both;10 reps;Supine General Exercises - Lower Extremity Heel Slides: PROM;Both;10 reps;Supine Hip ABduction/ADduction: PROM;Both;10 reps;Supine Straight Leg Raises: PROM;Both;Supine;10 reps Hip Flexion/Marching: PROM;Both;10 reps;Supine Other Exercises Other Exercises: Hip IR/ER stretch, calf stretch, glute stretch, hamstring stretch 3x30 seconds bilaterally    General Comments        Pertinent Vitals/Pain Pain Assessment: Faces Faces Pain Scale: Hurts a little bit Pain Location: Grimace with movement to L wrist/hand and when rolling in bed, otherwise no indication of pain Pain Descriptors / Indicators: Grimacing Pain Intervention(s): Limited activity within patient's tolerance;Monitored during session    Home Living                      Prior Function            PT Goals (current goals can now be found in the care plan section) Acute Rehab PT Goals PT Goal Formulation:  With patient/family Time For Goal Achievement: 08/23/16 Potential to Achieve Goals: Fair Progress towards PT goals: Not progressing toward goals - comment (pt with increased pain last 2 txs, goals remain appropriate)    Frequency    Min 2X/week      PT Plan Current plan remains appropriate    Co-evaluation             End of Session Equipment Utilized During Treatment:  Oxygen Activity Tolerance: Patient limited by lethargy Patient left: in bed;with call bell/phone within reach;with family/visitor present     Time: 8309-4076 PT Time Calculation (min) (ACUTE ONLY): 40 min  Charges:  $Therapeutic Exercise: 23-37 mins $Therapeutic Activity: 8-22 mins                    G Codes:      Emerald Shor E Penven-Crew 08/16/2016, 2:21 PM

## 2016-08-16 NOTE — Telephone Encounter (Signed)
Patient's husband is calling stating the patient has been in the hospital at [facility] since 08-04-16 for pneumonia. She is better with pneumonia but yesterday she started having questionable MS problems and would like to discuss. I advised Dr. Felecia Shelling is out of the office today but will send to his nurse.

## 2016-08-16 NOTE — Consult Note (Signed)
[facility] Consult   Reason for Consult:  Pt refusing to talk Referring Physician:  [facility] Patient Identification: Vanessa Short MRN:  [id number] Principal Diagnosis: MDD (major depressive disorder), recurrent severe, without psychosis ([place]) Diagnosis:   Patient Active Problem List   Diagnosis Date Noted  . MDD (major depressive disorder), recurrent severe, without psychosis ([place]) [F33.2] 08/16/2016    Priority: High  . Altered mental state [R41.82]   . Acute respiratory failure with hypoxia ([place]) [J96.01] 08/07/2016  . CAP (community acquired pneumonia) [J18.9] 08/07/2016  . Left lower lobe pneumonia ([place]) [J18.1] 08/05/2016  . Hypokalemia [E87.6] 08/05/2016  . Normocytic anemia [D64.9] 08/05/2016  . Slurred speech [R47.81] 08/05/2016  . Ulcerative colitis ([place]) [K51.90] 08/05/2016  . Essential hypertension [I10] 08/05/2016  . Subacromial bursitis [M75.50] 05/09/2016  . Visual hallucination [R44.1] 03/14/2016  . Other fatigue [R53.83] 11/09/2015  . Urinary disorder [N39.9] 11/09/2015  . Ear ache [H92.09] 10/13/2015  . Sore throat [J02.9] 10/13/2015  . Hand weakness [R29.898] 01/06/2015  . Multiple sclerosis ([place]) [G35] 09/07/2014  . 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 Dysesthesia [R20.8] 09/07/2014  . Depression [F32.9] 09/07/2014    Total Time spent with patient: 20 minutes  Subjective:   Vanessa Short is a 51 y.o. female patient admitted with reports of LLL pneumonia and chronic MS. Pt seen and chart reviewed. Pt is somnolent yet can be awoken and will respond verbally without opening her eyes. Due to pain, I asked the pt's husband to wake her by touching her, which he did. Pt opened her eyes and made eye contact with me briefly. She answered my questions, but very softly. She denied suicidal/homicidal ideation and psychosis and did not appear to be responding to internal stimuli.   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 Changing her medications at this time (long-term psych meds 72yr) would probably do more harm than could. We are clearing her from psychiatry as this is behavioral and a conscious decision on the part of the patient with whom she chooses to communicate. I believe neurology is signing off as well and internal medicine can decide when they want to clear her and discharge her medically. The husband should be encouraged to be diligent about urging the patient to participate in her treatment plan.   HPI:  [facility] "[place] RKavanaughis an 51y.o. female standing MS and punctate lesion in the left hemisphere in the past. Patient was brought in to the hospital on this occasion secondary to pneumonia. Patient had expressed to her husband that she has been having more discomfort, chest congestion and feeling a dyspnea. Chest x-ray showed worsening of bilateral infiltrates and worsening of pneumonia versus volume overload. It was felt that the patient would benefit from short-term rehabilitation. On the date of 08/15/2016 although she was waiting skilled nursing facility overnight she became more agitated had a change in mental status and began crying. Neurology was asked to come and see patient as there was question if this might be secondary to her multiple sclerosis. On consultation patient is laying in bed, will not open her eyes, does not track the PA in the room and will not communicate with the neuro staff. She has been reported to intermittently communicate with certain people. Pt spent the night without incident and psychiatry was consulted  to determine if we could help.  See full evaluation today 08/16/16 as above.   Past Psychiatric History: depression  Risk to Self: Is patient at risk for suicide?: No Risk to  Others:   Prior Inpatient Therapy:   Prior Outpatient Therapy:    Past Medical History:  Past Medical History:  Diagnosis Date  . Headache   . Hypertension   . Kidney stones   . Movement disorder   . Multiple sclerosis ([place])   . Neuropathy ([place])   . Ulcerative colitis ([place])   . Vision abnormalities     Past Surgical History:  Procedure Laterality Date  . KIDNEY STONE SURGERY     Family History:  Family History  Problem Relation Age of Onset  . Dementia Mother   . Hypertension Father   . Hyperlipidemia Father   . Diabetes Father   . Heart disease Father    Family Psychiatric  History: denies Social History:  History  Alcohol Use No     History  Drug Use No    Social History   Social History  . Marital status: Married    Spouse name: N/A  . Number of children: N/A  . Years of education: N/A   Social History Main Topics  . Smoking status: Never Smoker  . Smokeless tobacco: Never Used  . Alcohol use No  . Drug use: No  . Sexual activity: Not Asked   Other Topics Concern  . None   Social History Narrative  . None   Additional Social History:    Allergies:   Allergies  Allergen Reactions  . Penicillins Rash    Has patient had a PCN reaction causing immediate rash, facial/tongue/throat swelling, SOB or lightheadedness with hypotension: NO Has patient had a PCN reaction causing severe rash involving mucus membranes or skin necrosis: NO Has patient had a PCN reaction that required hospitalization NO Has patient had a PCN reaction occurring within the last 10 years:NO If all of the above answers are "NO", then may proceed with Cephalosporin use.    Labs:  Results for orders placed or performed during the hospital encounter of 08/04/16 (from the past 48 hour(s))  Glucose, capillary     Status: None   Collection Time: 08/15/16  7:17 AM  Result Value Ref Range   Glucose-Capillary 99 65 - 99 mg/dL  Basic metabolic panel     Status: Abnormal    Collection Time: 08/15/16  4:51 PM  Result Value Ref Range   Sodium 140 135 - 145 mmol/L   Potassium 3.8 3.5 - 5.1 mmol/L   Chloride 98 (L) 101 - 111 mmol/L   CO2 28 22 - 32 mmol/L   Glucose, Bld 101 (H) 65 - 99 mg/dL   BUN 23 (H) 6 - 20 mg/dL   Creatinine, Ser 1.12 (H) 0.44 - 1.00 mg/dL   Calcium 9.2 8.9 - 10.3 mg/dL   GFR calc non Af Amer 56 (L) >60 mL/min   GFR calc Af Amer >60 >60 mL/min    Comment: (NOTE) The eGFR has been calculated using the CKD EPI equation. This calculation has not been validated in all clinical situations. eGFR's persistently <60 mL/min signify possible Chronic Kidney Disease.    Anion gap 14 5 - 15  CBC     Status: Abnormal   Collection Time: 08/15/16  4:51 PM  Result Value Ref Range   WBC 8.0 4.0 - 10.5 K/uL   RBC 3.41 (L) 3.87 - 5.11 MIL/uL  Hemoglobin 10.8 (L) 12.0 - 15.0 g/dL   HCT 34.2 (L) 36.0 - 46.0 %   MCV 100.3 (H) 78.0 - 100.0 fL   MCH 31.7 26.0 - 34.0 pg   MCHC 31.6 30.0 - 36.0 g/dL   RDW 14.2 11.5 - 15.5 %   Platelets 218 150 - 400 K/uL  CBC     Status: Abnormal   Collection Time: 08/16/16  6:31 AM  Result Value Ref Range   WBC 8.0 4.0 - 10.5 K/uL   RBC 3.49 (L) 3.87 - 5.11 MIL/uL   Hemoglobin 11.0 (L) 12.0 - 15.0 g/dL   HCT 35.2 (L) 36.0 - 46.0 %   MCV 100.9 (H) 78.0 - 100.0 fL   MCH 31.5 26.0 - 34.0 pg   MCHC 31.3 30.0 - 36.0 g/dL   RDW 14.5 11.5 - 15.5 %   Platelets 226 150 - 400 K/uL  Basic metabolic panel     Status: Abnormal   Collection Time: 08/16/16  6:31 AM  Result Value Ref Range   Sodium 140 135 - 145 mmol/L   Potassium 4.0 3.5 - 5.1 mmol/L   Chloride 95 (L) 101 - 111 mmol/L   CO2 33 (H) 22 - 32 mmol/L   Glucose, Bld 111 (H) 65 - 99 mg/dL   BUN 30 (H) 6 - 20 mg/dL   Creatinine, Ser 1.17 (H) 0.44 - 1.00 mg/dL   Calcium 9.6 8.9 - 10.3 mg/dL   GFR calc non Af Amer 53 (L) >60 mL/min   GFR calc Af Amer >60 >60 mL/min    Comment: (NOTE) The eGFR has been calculated using the CKD EPI equation. This calculation has  not been validated in all clinical situations. eGFR's persistently <60 mL/min signify possible Chronic Kidney Disease.    Anion gap 12 5 - 15    Current Facility-Administered Medications  Medication Dose Route Frequency Provider Last Rate Last Dose  . acetaminophen (TYLENOL) tablet 650 mg  650 mg Oral Q6H PRN Edwin Dada, MD   650 mg at 08/15/16 1828   Or  . acetaminophen (TYLENOL) suppository 650 mg  650 mg Rectal Q6H PRN Edwin Dada, MD      . acyclovir (ZOVIRAX) tablet 400 mg  400 mg Oral Daily Edwin Dada, MD   400 mg at 08/16/16 0902  . albuterol (PROVENTIL) (2.5 MG/3ML) 0.083% nebulizer solution 2.5 mg  2.5 mg Nebulization BID Bonnielee Haff, MD   2.5 mg at 08/16/16 0840  . amitriptyline (ELAVIL) tablet 75 mg  75 mg Oral QHS Edwin Dada, MD   75 mg at 08/15/16 2153  . aspirin chewable tablet 81 mg  81 mg Oral Daily Greta Doom, MD   81 mg at 08/16/16 0902  . atorvastatin (LIPITOR) tablet 20 mg  20 mg Oral q1800 Albertine Patricia, MD   20 mg at 08/15/16 1830  . baclofen (LIORESAL) tablet 10 mg  10 mg Oral BID Bonnielee Haff, MD   10 mg at 08/16/16 0858  . baclofen (LIORESAL) tablet 15 mg  15 mg Oral QHS Bonnielee Haff, MD   15 mg at 08/15/16 2154  . bisoprolol (ZEBETA) tablet 5 mg  5 mg Oral Daily Tanda Rockers, MD   5 mg at 08/16/16 0902  . dantrolene (DANTRIUM) capsule 50 mg  50 mg Oral TID AC & HS Edwin Dada, MD   50 mg at 08/16/16 1203  . darifenacin (ENABLEX) 24 hr tablet 15 mg  15 mg Oral  Daily Edwin Dada, MD   15 mg at 08/16/16 0909  . diphenoxylate-atropine (LOMOTIL) 2.5-0.025 MG per tablet 2 tablet  2 tablet Oral QID PRN Albertine Patricia, MD   2 tablet at 08/11/16 1100  . docusate sodium (COLACE) capsule 100 mg  100 mg Oral BID Bonnielee Haff, MD   100 mg at 08/16/16 0902  . enoxaparin (LOVENOX) injection 40 mg  40 mg Subcutaneous Daily Edwin Dada, MD   40 mg at 08/16/16 0909  . feeding  supplement (ENSURE ENLIVE) (ENSURE ENLIVE) liquid 237 mL  237 mL Oral BID BM Albertine Patricia, MD   237 mL at 08/16/16 1203  . FLUoxetine (PROZAC) capsule 40 mg  40 mg Oral Daily Edwin Dada, MD   40 mg at 08/16/16 0902  . furosemide (LASIX) tablet 40 mg  40 mg Oral BID Bonnielee Haff, MD   40 mg at 08/16/16 0902  . Gerhardt's butt cream   Topical BID Albertine Patricia, MD      . guaiFENesin [facility]) 12 hr tablet 1,200 mg  1,200 mg Oral BID Tanda Rockers, MD   1,200 mg at 08/16/16 0902  . lamoTRIgine (LAMICTAL) tablet 200 mg  200 mg Oral TID Edwin Dada, MD   200 mg at 08/16/16 0902  . LORazepam (ATIVAN) tablet 0.5 mg  0.5 mg Oral Daily PRN Ripudeep K Rai, MD      . LORazepam (ATIVAN) tablet 1 mg  1 mg Oral Once Jeryl Columbia, NP      . mesalamine (PENTASA) CR capsule 1,000 mg  1,000 mg Oral TID Edwin Dada, MD   1,000 mg at 08/16/16 0901  . ondansetron (ZOFRAN) tablet 4 mg  4 mg Oral Q6H PRN Edwin Dada, MD   4 mg at 08/13/16 2104   Or  . ondansetron (ZOFRAN) injection 4 mg  4 mg Intravenous Q6H PRN Edwin Dada, MD   4 mg at 08/12/16 1506  . Oxcarbazepine (TRILEPTAL) tablet 150 mg  150 mg Oral TID Edwin Dada, MD   150 mg at 08/16/16 0902  . oxyCODONE-acetaminophen (PERCOCET/ROXICET) 5-325 MG per tablet 1-2 tablet  1-2 tablet Oral Q4H PRN Ripudeep Krystal Eaton, MD   1 tablet at 08/16/16 0859  . pantoprazole (PROTONIX) EC tablet 40 mg  40 mg Oral BID AC Tanda Rockers, MD   40 mg at 08/16/16 0902  . polyethylene glycol (MIRALAX / GLYCOLAX) packet 17 g  17 g Oral Daily Ripudeep K Rai, MD      . sodium chloride (OCEAN) 0.65 % nasal spray 1 spray  1 spray Each Nare PRN Albertine Patricia, MD      . Teriflunomide TABS 1 tablet  1 tablet Oral Daily Albertine Patricia, MD   1 tablet at 08/15/16 1813    Musculoskeletal: Strength & Muscle Tone: decreased Gait & Station: in bed Patient leans: N/A  Psychiatric Specialty Exam: Physical  Exam  Review of Systems  Psychiatric/Behavioral: Positive for depression. Negative for hallucinations, substance abuse and suicidal ideas. The patient is not nervous/anxious and does not have insomnia.   All other systems reviewed and are negative.   Blood pressure 140/89, pulse 85, temperature 98 F (36.7 C), temperature source Axillary, resp. rate (!) 23, height _0  (1.6 m), weight 71.7 kg (158 lb), SpO2 99 %.Body mass index is 27.99 kg/m.  General Appearance: Casual and Fairly Groomed  Eye Contact:  Fair  Speech:  Clear and Coherent  and Normal Rate  Volume:  Decreased  Mood:  Depressed  Affect:  Appropriate, Congruent and Depressed  Thought Process:  Coherent, Goal Directed, Linear and Descriptions of Associations: Intact  Orientation:  Full (Time, Place, and Person)  Thought Content:  Focused on treatment  Suicidal Thoughts:  No  Homicidal Thoughts:  No  Memory:  Immediate;   Fair Recent;   Fair Remote;   Fair  Judgement:  Fair  Insight:  Good  Psychomotor Activity:  Normal  Concentration:  Concentration: Fair and Attention Span: Fair  Recall:  [employer] of Knowledge:  Fair  Language:  Fair  Akathisia:  No  Handed:    AIMS (if indicated):     Assets:  Communication Skills Desire for Improvement Resilience Social Support  ADL's:  Intact  Cognition:  WNL  Sleep:      Treatment Plan Summary: MDD (major depressive disorder), recurrent severe, without psychosis ([place]) which can be managed outpatient with psychiatry and current medication regimen  Medications: -Continue Trileptal 13m po tid for mood stabilization -Continue prozac 471mpo daily for depression -Continue lamictal 20053mo tid (neurology)  Disposition: No evidence of imminent risk to self or others at present.   Patient does not meet criteria for psychiatric inpatient admission. Supportive therapy provided about ongoing stressors. Refer to IOP. Discussed crisis plan, support from social network,  calling 911, coming to the Emergency Department, and calling Suicide Hotline.  WitBenjamine MolaNP 08/16/2016 2:00 PM

## 2016-08-16 NOTE — Consult Note (Signed)
NEURO HOSPITALIST CONSULT NOTE   Requestig physician: Dr. Salome Arnt   Reason for Consult: non talkative and not communication   History obtained from:  husband  HPI:                                                                                                                                          Vanessa Short is an 51 y.o. female standing MS and punctate lesion in the left hemisphere in the past. Patient was brought in to the hospital on this occasion secondary to pneumonia. Patient had expressed to her husband that she has been having more discomfort, chest congestion and feeling a dyspnea. Chest x-ray showed worsening of bilateral infiltrates and worsening of pneumonia versus volume overload. It was felt that the patient would benefit from short-term rehabilitation. On the date of 08/15/2016 although she was waiting skilled nursing facility overnight she became more agitated had a change in mental status and began crying. Neurology was asked to come and see patient as there was question if this might be secondary to her multiple sclerosis. On consultation patient is laying in bed, will not open her eyes, does not track me in the room and will not communicate with me. Patient does grimace to sternal rub, when eyes are opened she attends to forcefully shut them but will blink to threat. Patient grimaces to pain in all 4 extremities. When attempting to open her mouth she forcefully clenches her mouth shut. After talking to her very softly and asking to see her tongue she will open her mouth and stick her tongue out. Per nurse, patient will open her mouth to take her pills but this again is very difficult.  In speaking with her husband, patient has stated that she is in more pain than usual and "she is sick of all of this and wanted to stop."  he states that she has not blatantly stated that she wants to harm herself or kill herself as of yet.  Past Medical History:  Diagnosis  Date  . Headache   . Hypertension   . Kidney stones   . Movement disorder   . Multiple sclerosis ([place])   . Neuropathy ([place])   . Ulcerative colitis ([place])   . Vision abnormalities     Past Surgical History:  Procedure Laterality Date  . KIDNEY STONE SURGERY      Family History  Problem Relation Age of Onset  . Dementia Mother   . Hypertension Father   . Hyperlipidemia Father   . Diabetes Father   . Heart disease Father      Social History:  reports that she has never smoked. She has never used smokeless tobacco. She reports that she does not drink alcohol or use drugs.  Allergies  Allergen  Reactions  . Penicillins Rash    Has patient had a PCN reaction causing immediate rash, facial/tongue/throat swelling, SOB or lightheadedness with hypotension: NO Has patient had a PCN reaction causing severe rash involving mucus membranes or skin necrosis: NO Has patient had a PCN reaction that required hospitalization NO Has patient had a PCN reaction occurring within the last 10 years:NO If all of the above answers are "NO", then may proceed with Cephalosporin use.    MEDICATIONS:                                                                                                                     Scheduled: . acyclovir  400 mg Oral Daily  . albuterol  2.5 mg Nebulization BID  . amitriptyline  75 mg Oral QHS  . aspirin  81 mg Oral Daily  . atorvastatin  20 mg Oral q1800  . baclofen  10 mg Oral BID  . baclofen  15 mg Oral QHS  . bisoprolol  5 mg Oral Daily  . dantrolene  50 mg Oral TID AC & HS  . darifenacin  15 mg Oral Daily  . docusate sodium  100 mg Oral BID  . enoxaparin (LOVENOX) injection  40 mg Subcutaneous Daily  . feeding supplement (ENSURE ENLIVE)  237 mL Oral BID BM  . FLUoxetine  40 mg Oral Daily  . furosemide  40 mg Oral BID  . Gerhardt's butt cream   Topical BID  . guaiFENesin  1,200 mg Oral BID  . lamoTRIgine  200 mg Oral TID  . LORazepam  1 mg Oral Once  .  mesalamine  1,000 mg Oral TID  . OXcarbazepine  150 mg Oral TID  . pantoprazole  40 mg Oral BID AC  . Teriflunomide  1 tablet Oral Daily     ROS:                                                                                                                                       History obtained from unobtainable from patient due to As patient will not take part in this portion of physical and history  General ROS: negative for - chills, fatigue, fever, night sweats, weight gain or weight loss Psychological ROS: negative for - behavioral disorder, hallucinations, memory difficulties, mood swings or suicidal ideation Ophthalmic ROS: negative for - blurry vision, double vision, eye pain or loss of vision  ENT ROS: negative for - epistaxis, nasal discharge, oral lesions, sore throat, tinnitus or vertigo Allergy and Immunology ROS: negative for - hives or itchy/watery eyes Hematological and Lymphatic ROS: negative for - bleeding problems, bruising or swollen lymph nodes Endocrine ROS: negative for - galactorrhea, hair pattern changes, polydipsia/polyuria or temperature intolerance Respiratory ROS: negative for - cough, hemoptysis, shortness of breath or wheezing Cardiovascular ROS: negative for - chest pain, dyspnea on exertion, edema or irregular heartbeat Gastrointestinal ROS: negative for - abdominal pain, diarrhea, hematemesis, nausea/vomiting or stool incontinence Genito-Urinary ROS: negative for - dysuria, hematuria, incontinence or urinary frequency/urgency Musculoskeletal ROS: negative for - joint swelling or muscular weakness Neurological ROS: as noted in HPI Dermatological ROS: negative for rash and skin lesion changes   Blood pressure 140/89, pulse 85, temperature 98 F (36.7 C), temperature source Axillary, resp. rate (!) 23, height 5' 3"  (1.6 m), weight 71.7 kg (158 lb), SpO2 99 %.   Neurologic Examination:                                                                                                       HEENT-  Normocephalic, no lesions, without obvious abnormality.  Normal external eye and conjunctiva.  Normal TM's bilaterally.  Normal auditory canals and external ears. Normal external nose, mucus membranes and septum.  Normal pharynx. Cardiovascular- S1, S2 normal, pulses palpable throughout   Lungs- chest clear, no wheezing, rales, normal symmetric air entry Abdomen- normal findings: bowel sounds normal Extremities- no edema Lymph-no adenopathy palpable Musculoskeletal-no joint tenderness, deformity or swelling Skin-warm and dry, no hyperpigmentation, vitiligo, or suspicious lesions  Neurological Examination Mental Status: Patient is laying supine in bed, makes no effort to communicate, makes no effort to follow commands, winces to pain in all 4 extremities and to noxious stimuli on her face. Blinks to threat bilaterally. Attempts to forcefully shut her eyes and clench her mouth shut when I attempt to open them Cranial Nerves: II: Blinks to threat bilaterally pupils equal, round, reactive to light and accommodation III,IV, VI: Doll's maneuver intact, eyelid closure is very strong V,VII: smile symmetric, facial light touch sensation normal bilaterally VIII: hearing normal bilaterally IX,X: uvula rises symmetrically XI: bilateral shoulder shrug XII: midline tongue extension Motor: Patient is moving her upper extremities minimally to noxious stimuli and does not move her lower extremities to noxious stimuli--no significant increased tone noted throughout Sensory: Winces to noxious stimuli in all 4 extremities Deep Tendon Reflexes: Deep tendon reflexes are minimal throughout Plantars: Mute bilaterally Cerebellar: Could not assess as patient would not take part in exam Gait: Not tested      Lab Results: Basic Metabolic Panel:  Recent Labs Lab 08/10/16 0438 08/11/16 0528 08/12/16 0458 08/13/16 0726 08/15/16 1651 08/16/16 0631  NA 134* 137  --   138 140 140  K 3.4* 3.5  --  3.0* 3.8 4.0  CL 92* 96*  --  94* 98* 95*  CO2 29 29  --  35* 28 33*  GLUCOSE 87 116*  --  114* 101* 111*  BUN 16 16  --  23* 23* 30*  CREATININE 0.97 1.07* 1.11* 1.18* 1.12* 1.17*  CALCIUM 8.3* 8.3*  --  9.4 9.2 9.6    Liver Function Tests: No results for input(s): AST, ALT, ALKPHOS, BILITOT, PROT, ALBUMIN in the last 168 hours. No results for input(s): LIPASE, AMYLASE in the last 168 hours. No results for input(s): AMMONIA in the last 168 hours.  CBC:  Recent Labs Lab 08/13/16 0726 08/15/16 1651 08/16/16 0631  WBC 7.8 8.0 8.0  HGB 10.5* 10.8* 11.0*  HCT 32.4* 34.2* 35.2*  MCV 97.9 100.3* 100.9*  PLT 200 218 226    Cardiac Enzymes: No results for input(s): CKTOTAL, CKMB, CKMBINDEX, TROPONINI in the last 168 hours.  Lipid Panel: No results for input(s): CHOL, TRIG, HDL, CHOLHDL, VLDL, LDLCALC in the last 168 hours.  CBG:  Recent Labs Lab 08/15/16 0717  GLUCAP 99    Microbiology: Results for orders placed or performed during the hospital encounter of 08/04/16  Respiratory Panel by PCR     Status: Abnormal   Collection Time: 08/08/16  3:12 PM  Result Value Ref Range Status   Adenovirus NOT DETECTED NOT DETECTED Final   Coronavirus 229E NOT DETECTED NOT DETECTED Final   Coronavirus HKU1 NOT DETECTED NOT DETECTED Final   Coronavirus NL63 NOT DETECTED NOT DETECTED Final   Coronavirus OC43 NOT DETECTED NOT DETECTED Final   Metapneumovirus DETECTED (A) NOT DETECTED Final   Rhinovirus / Enterovirus NOT DETECTED NOT DETECTED Final   Influenza A NOT DETECTED NOT DETECTED Final   Influenza B NOT DETECTED NOT DETECTED Final   Parainfluenza Virus 1 NOT DETECTED NOT DETECTED Final   Parainfluenza Virus 2 NOT DETECTED NOT DETECTED Final   Parainfluenza Virus 3 NOT DETECTED NOT DETECTED Final   Parainfluenza Virus 4 NOT DETECTED NOT DETECTED Final   Respiratory Syncytial Virus NOT DETECTED NOT DETECTED Final   Bordetella pertussis NOT  DETECTED NOT DETECTED Final   Chlamydophila pneumoniae NOT DETECTED NOT DETECTED Final   Mycoplasma pneumoniae NOT DETECTED NOT DETECTED Final  MRSA PCR Screening     Status: None   Collection Time: 08/09/16 11:48 AM  Result Value Ref Range Status   MRSA by PCR NEGATIVE NEGATIVE Final    Comment:        The GeneXpert MRSA Assay (FDA approved for NASAL specimens only), is one component of a comprehensive MRSA colonization surveillance program. It is not intended to diagnose MRSA infection nor to guide or monitor treatment for MRSA infections.   C difficile quick scan w PCR reflex     Status: None   Collection Time: 08/09/16  2:28 PM  Result Value Ref Range Status   C Diff antigen NEGATIVE NEGATIVE Final   C Diff toxin NEGATIVE NEGATIVE Final   C Diff interpretation No C. difficile detected.  Final    Coagulation Studies: No results for input(s): LABPROT, INR in the last 72 hours.  Imaging: Ct Head Wo Contrast  Result Date: 08/15/2016 CLINICAL DATA:  Altered mental status. History of multiple sclerosis EXAM: CT HEAD WITHOUT CONTRAST TECHNIQUE: Contiguous axial images were obtained from the base of the skull through the vertex without intravenous contrast. COMPARISON:  Head CT August 04, 2016 and brain MRI August 05, 2016 FINDINGS: Brain: Mild diffuse atrophy is stable. There is an apparent arachnoid cyst with remodeling in the anterior left temporal region measuring 3.0 x 1.3 cm, stable. There is no new mass, hemorrhage, extra-axial fluid collection, or midline shift. Extensive decreased attenuation throughout the periventricular white matter  remains stable. No new lesion is evident in gray-white compartment regions. Vascular: There is no hyperdense vessel. There is no appreciable vascular calcification. Skull: The bony calvarium appears intact. Sinuses/Orbits: There are multiple areas of opacification in ethmoid air cells bilaterally. There is a small air-fluid level in the right  sphenoid region. Other paranasal sinuses are clear. Orbits appear symmetric bilaterally. Other: Mastoid air cells are clear. IMPRESSION: Widespread decreased attenuation in the supratentorial white matter, likely due to extensive demyelination. There may be a degree of superimposed small vessel disease. At over acute appearing infarct is not evident. No hemorrhage or edema seen. 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 No new mass. There is multifocal paranasal sinus disease with air-fluid level in the right sphenoid sinus. With respect to assessment for potential active foci of demyelinating plaque, MR pre and post-contrast would be the imaging study of choice to further assess. Electronically Signed   By: Lowella Grip III M.D.   On: 08/15/2016 12:29   Dg Chest Port 1 View  Result Date: 08/15/2016 CLINICAL DATA:  Cough and fever.  Left lower lobe pneumonia. EXAM: PORTABLE CHEST 1 VIEW COMPARISON:  08/12/2016 FINDINGS: There has been slight improvement in the left lower lobe consolidation since the prior study. Right lung remains clear. Heart size and vascularity are normal. No acute bone abnormality. IMPRESSION: Partial clearing of the left lower lobe pneumonia. Electronically Signed   By: Lorriane Shire M.D.   On: 08/15/2016 10:59       Assessment and plan per attending neurologist  Etta Quill [name] [facility] [phone]  08/16/2016, 1:29 PM I have seen the patient reviewed the above note. On my exam, the patient answers yes or no questions with very subtle shakes or [place] of her head.  Assessment/Plan: 51 year old female with history of multiple sclerosis and altered mental status. Her exam is very odd, with low amplitude head shaking or nods as her means of communication, but she is reliable with her answers.  When she does answer questions, it is with a very low voice.  My suspicion is that there may be some psychiatric component to this  presentation, however with her history of MS I do think that ruling out flare would be prudent. Also given that there had been consideration of a cervical spine MRI prior, I will perform this at this time.  Of note, her baclofen dose was increased 2 days prior to her increased sedation.  1) MRI brain with/without 2) MRI cervical spine with/without 3) no improvement overnight, consider reducing baclofen dose. 4) neurology will follow  Roland Rack, MD [facility] [phone]  If 7pm- 7am, please page neurology on call as listed in [place].

## 2016-08-16 NOTE — Progress Notes (Signed)
[facility]                                                                              Patient Demographics  Vanessa Short, is a 51 y.o. female, DOB - 1966/07/27, XAJ:[id number]  Admit date - 08/04/2016   Admitting Physician Edwin Dada, MD  Outpatient Primary MD for the patient is [place]  Outpatient specialists:   LOS - 11  days    Chief Complaint  Patient presents with  . Nasal Congestion  . Fatigue       Brief summary   51 y.o.femalewith a past medical history significant for multiple sclerosis with quadruplegiawho presented with 2 days cough as well as confusion and slurred speech, Workup significant for CAP, Transferred to stepdown on 12/25 giving respiratory distress and worsening hypoxia, Chest x-ray with worsening bilateral infiltrate, worsening pneumonia versus volume overload, 2-D echo with diastolic dysfunction,respiratory status improved with IV diuresis, respiratory panel significant for metapneumovirus. Patient was seen by physical therapy. It was felt that she would benefit from short-term rehabilitation.    Assessment & Plan    Acute encephalopathy - Unclear etiology, although at the time of my examination, patient kept her eyes closed but was listening to everything and followed all the commands. She had received pain medication earlier and was comfortable. - CT head 1/2 showed extensive demyelination with superimposed small vessel disease no acute infarct hemorrhage or edema. - EEG was nonspecific but showed no seizures - Spoke with husband at the bedside who reported that patient is having more pain and spasms yesterday which was causing her agitation. I placed her on PRN Percocet which had provided relief overnight and Ativan. He requested for neurology consultation, discussed with Dr. Leonel Ramsay who will evaluate today.  - Psychiatry evaluation pending  - PT evaluation recommending  skilled nursing facility   Community-acquired Pneumonia - Chest x-ray was significant for right middle lobe, left lower lobe pneumonia, Treated for total of 7 days of IV Rocephin and azithromycin . - urine strep antigennegative, Respiratory panel positive for metapneumovirus virus - RT for chest physiotherapy, IS, flutter valve given very poor cough mechanics - Pro calcitonin normalized,afebrile, no leukocytosis. Chest x-ray repeated 12/30 continues to show a left lower lobe opacity. Chest x-ray 1/2 showed partial clearing of left lower lobe pneumonia   Acute hypoxic respiratory failure - Secondary to pneumonia , chest x-ray with worsening pneumonia since admission, respiratory failure most likely due to pneumonia, and poor cough mechanics, pulmonary input appreciated -  continue chest PT and nebulizers . - Stable.  - Supplemental O2 as needed  Acute diastolic CHF/hypokalemia - 2-D echo with preserved EF but with grade 2 diastolic dysfunction, -  respiratory status improving with diuresis. Chest x-ray does not show any evidence of fluid overload.  - Continue oral diuretics.  History of MS with quadriplegia - Continue Aubagio, dantrolene and baclofen - Continue amitriptyline and lamotrigine and Trileptal for dysesthesias - Continue Vesicare  Anemia: - Chronic, unchanged.  Slurred speech - Suspect a metabolic encephalopathy from pneumonia in setting of MS.  - MRI brain with abnormal findings in basal ganglia, ischemic  versus active demyelination. Neurology was consulted. Concern for stroke versus tiny demyelinating plaque, stroke diagnosis is not certain.  - Started on aspirin, Lipitor, 2-D echo with no embolic source, carotid Doppler with no significant ICA stenosis, PT/OT . - She will be discharged on aspirin and Lipitor.  Ulcerative colitis - Continue mesalamine.   Depression - Continue fluoxetine  Essential Hypertension - Continue labetalol  Other  medications: - Continue acyclovir  Diarrhea - C. difficile negative. Diarrhea has resolved. Hasn't had a BM in few days. Initiated stool softener.  Code Status: Full code DVT Prophylaxis:  Lovenox  Family Communication: Discussed in detail with the patient, all imaging results, lab results explained to the patient  and husband   Disposition Plan: Awaiting neurology recommendations, psychiatry evaluation, then skilled nursing facility   Time Spent in minutes   25 minutes  Procedures:  MRI brain   EEG  Consultants:   Neurology   psychiatry  Antimicrobials:   Zithromax completed  Rocephin completed    Medications  Scheduled Meds: . acyclovir  400 mg Oral Daily  . albuterol  2.5 mg Nebulization BID  . amitriptyline  75 mg Oral QHS  . aspirin  81 mg Oral Daily  . atorvastatin  20 mg Oral q1800  . baclofen  10 mg Oral BID  . baclofen  15 mg Oral QHS  . bisoprolol  5 mg Oral Daily  . dantrolene  50 mg Oral TID AC & HS  . darifenacin  15 mg Oral Daily  . docusate sodium  100 mg Oral BID  . enoxaparin (LOVENOX) injection  40 mg Subcutaneous Daily  . feeding supplement (ENSURE ENLIVE)  237 mL Oral BID BM  . FLUoxetine  40 mg Oral Daily  . furosemide  40 mg Oral BID  . Gerhardt's butt cream   Topical BID  . guaiFENesin  1,200 mg Oral BID  . lamoTRIgine  200 mg Oral TID  . LORazepam  1 mg Oral Once  . mesalamine  1,000 mg Oral TID  . OXcarbazepine  150 mg Oral TID  . pantoprazole  40 mg Oral BID AC  . polyethylene glycol  17 g Oral Daily  . Teriflunomide  1 tablet Oral Daily   Continuous Infusions: PRN Meds:.acetaminophen **OR** acetaminophen, diphenoxylate-atropine, LORazepam, ondansetron **OR** ondansetron (ZOFRAN) IV, oxyCODONE-acetaminophen, sodium chloride   Antibiotics   Anti-infectives    Start     Dose/Rate Route Frequency Ordered Stop   08/06/16 2200  cefTRIAXone (ROCEPHIN) 1 g in dextrose 5 % 50 mL IVPB  Status:  Discontinued     1 g 100 mL/hr over  30 Minutes Intravenous Every 24 hours 08/05/16 0251 08/10/16 1156   08/06/16 2200  azithromycin (ZITHROMAX) tablet 500 mg  Status:  Discontinued     500 mg Oral Every 24 hours 08/05/16 0251 08/10/16 1156   08/05/16 1000  acyclovir (ZOVIRAX) tablet 400 mg    Comments:  Take every day per patient     400 mg Oral Daily 08/05/16 0251     08/04/16 2300  cefTRIAXone (ROCEPHIN) 1 g in dextrose 5 % 50 mL IVPB     1 g 100 mL/hr over 30 Minutes Intravenous  Once 08/04/16 2252 08/05/16 0019   08/04/16 2300  azithromycin (ZITHROMAX) 500 mg in dextrose 5 % 250 mL IVPB     500 mg 250 mL/hr over 60 Minutes Intravenous  Once 08/04/16 2252 08/05/16 0128        Subjective:   Rabecca Cousineau was seen  and examined today.  Her eyes closed during the examination, did not talk to me. Husband at the bedside stated that now she is comfortable overnight she was in pain and agitated.    Objective:   Vitals:   08/15/16 2250 08/16/16 0649 08/16/16 0841 08/16/16 1447  BP: 140/89   (!) 90/51  Pulse: 85   67  Resp: (!) 23   16  Temp: 99.1 F (37.3 C) 98 F (36.7 C)  98.3 F (36.8 C)  TempSrc: Oral Axillary    SpO2: 94%  99% 97%  Weight:  71.7 kg (158 lb)    Height:        Intake/Output Summary (Last 24 hours) at 08/16/16 1529 Last data filed at 08/16/16 1300  Gross per 24 hour  Intake              290 ml  Output                0 ml  Net              290 ml     Wt Readings from Last 3 Encounters:  08/16/16 71.7 kg (158 lb)  05/09/16 79.4 kg (175 lb)  11/09/15 79.4 kg (175 lb)     Exam  General: Alert and oriented, Answers with soft voice and follows simple commands during the examination, NAD, appears comfortable   HEENT:    Neck:   Cardiovascular: S1 S2 auscultated, no rubs, murmurs or gallops. Regular rate and rhythm.  Respiratory: Clear to auscultation bilaterally, no wheezing, rales or rhonchi  Gastrointestinal: Soft, nontender, nondistended, + bowel sounds  Ext: no cyanosis  clubbing or edema  Neuro: does not move lower extremities, moves her upper extremities, difficult to assess neurological examination   Skin: No rashes  Psych: Normal affect and demeanor, alert and oriented x self    Data Reviewed:  I have personally reviewed following labs and imaging studies  Micro Results Recent Results (from the past 240 hour(s))  Respiratory Panel by PCR     Status: Abnormal   Collection Time: 08/08/16  3:12 PM  Result Value Ref Range Status   Adenovirus NOT DETECTED NOT DETECTED Final   Coronavirus 229E NOT DETECTED NOT DETECTED Final   Coronavirus HKU1 NOT DETECTED NOT DETECTED Final   Coronavirus NL63 NOT DETECTED NOT DETECTED Final   Coronavirus OC43 NOT DETECTED NOT DETECTED Final   Metapneumovirus DETECTED (A) NOT DETECTED Final   Rhinovirus / Enterovirus NOT DETECTED NOT DETECTED Final   Influenza A NOT DETECTED NOT DETECTED Final   Influenza B NOT DETECTED NOT DETECTED Final   Parainfluenza Virus 1 NOT DETECTED NOT DETECTED Final   Parainfluenza Virus 2 NOT DETECTED NOT DETECTED Final   Parainfluenza Virus 3 NOT DETECTED NOT DETECTED Final   Parainfluenza Virus 4 NOT DETECTED NOT DETECTED Final   Respiratory Syncytial Virus NOT DETECTED NOT DETECTED Final   Bordetella pertussis NOT DETECTED NOT DETECTED Final   Chlamydophila pneumoniae NOT DETECTED NOT DETECTED Final   Mycoplasma pneumoniae NOT DETECTED NOT DETECTED Final  MRSA PCR Screening     Status: None   Collection Time: 08/09/16 11:48 AM  Result Value Ref Range Status   MRSA by PCR NEGATIVE NEGATIVE Final    Comment:        The GeneXpert MRSA Assay (FDA approved for NASAL specimens only), is one component of a comprehensive MRSA colonization surveillance program. It is not intended to diagnose MRSA infection nor to guide or  monitor treatment for MRSA infections.   C difficile quick scan w PCR reflex     Status: None   Collection Time: 08/09/16  2:28 PM  Result Value Ref Range  Status   C Diff antigen NEGATIVE NEGATIVE Final   C Diff toxin NEGATIVE NEGATIVE Final   C Diff interpretation No C. difficile detected.  Final    Radiology Reports Dg Chest 2 View  Result Date: 08/04/2016 CLINICAL DATA:  Hypoxia with cough EXAM: CHEST  2 VIEW COMPARISON:  05/30/2013 FINDINGS: Right lung is grossly clear. Airspace opacity is present within the left lower lobe and lingula consistent with pneumonia. Small left pleural effusion. Stable mild cardiomegaly. There is mild central vascular congestion. No pneumothorax. Scoliosis of the lower spine. IMPRESSION: 1. Moderate left mid and lower lung infiltrates with small left pleural effusion. 2. Mild cardiomegaly with central vascular congestion Electronically Signed   By: Donavan Foil M.D.   On: 08/04/2016 22:29   Ct Head Wo Contrast  Result Date: 08/15/2016 CLINICAL DATA:  Altered mental status. History of multiple sclerosis EXAM: CT HEAD WITHOUT CONTRAST TECHNIQUE: Contiguous axial images were obtained from the base of the skull through the vertex without intravenous contrast. COMPARISON:  Head CT August 04, 2016 and brain MRI August 05, 2016 FINDINGS: Brain: Mild diffuse atrophy is stable. There is an apparent arachnoid cyst with remodeling in the anterior left temporal region measuring 3.0 x 1.3 cm, stable. There is no new mass, hemorrhage, extra-axial fluid collection, or midline shift. Extensive decreased attenuation throughout the periventricular white matter remains stable. No new lesion is evident in gray-white compartment regions. Vascular: There is no hyperdense vessel. There is no appreciable vascular calcification. Skull: The bony calvarium appears intact. Sinuses/Orbits: There are multiple areas of opacification in ethmoid air cells bilaterally. There is a small air-fluid level in the right sphenoid region. Other paranasal sinuses are clear. Orbits appear symmetric bilaterally. Other: Mastoid air cells are clear. IMPRESSION:  Widespread decreased attenuation in the supratentorial white matter, likely due to extensive demyelination. There may be a degree of superimposed small vessel disease. At over acute appearing infarct is not evident. No hemorrhage or edema seen. 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 No new mass. There is multifocal paranasal sinus disease with air-fluid level in the right sphenoid sinus. With respect to assessment for potential active foci of demyelinating plaque, MR pre and post-contrast would be the imaging study of choice to further assess. Electronically Signed   By: Lowella Grip III M.D.   On: 08/15/2016 12:29   Ct Head Wo Contrast  Result Date: 08/04/2016 CLINICAL DATA:  Slurred speech and lightheadedness overnight. EXAM: CT HEAD WITHOUT CONTRAST TECHNIQUE: Contiguous axial images were obtained from the base of the skull through the vertex without intravenous contrast. COMPARISON:  01/24/2015 FINDINGS: Brain: There is no intracranial hemorrhage, mass or evidence of acute infarction. There is moderate generalized atrophy. There is moderate chronic microvascular ischemic change. There is no significant extra-axial fluid collection. No acute intracranial findings are evident. Vascular: No hyperdense vessel or unexpected calcification. Skull: Normal. Negative for fracture or focal lesion. Sinuses/Orbits: No acute finding. Other: None. IMPRESSION: No acute intracranial findings. There is moderate generalized atrophy and chronic appearing white matter hypodensities which likely represent small vessel ischemic disease. Electronically Signed   By: Andreas Newport M.D.   On: 08/04/2016 18:22   Mr Brain Wo Contrast  Result Date: 08/05/2016 CLINICAL DATA:  Altered mental status EXAM: MRI HEAD WITHOUT CONTRAST  TECHNIQUE: Multiplanar, multiecho pulse sequences of the brain and surrounding structures were obtained without intravenous contrast. COMPARISON:  Head CT 08/04/2016 Brain  MRI 01/24/2015 FINDINGS: Brain: Punctate focus of high signal on the diffusion-weighted images at the posterior aspect of the right basal ganglia is unchanged compared to 01/24/2015. There is an additional punctate focus of diffusion restriction in the posterior aspect of the left basal ganglia There is severe white matter disease with confluent lesions in the periventricular white matter, oriented perpendicularly to the long axis of the lateral ventricles. There is atrophy of the corpus callosum. There are numerous low T1 weighted signal lesions. No mass lesion or midline shift. No hydrocephalus or extra-axial fluid collection. There is a left middle cranial fossa arachnoid cyst. 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 Vascular: Major intracranial arterial and venous sinus flow voids are preserved. No evidence of chronic microhemorrhage or amyloid angiopathy. Skull and upper cervical spine: The visualized skull base, calvarium, upper cervical spine and extracranial soft tissues are normal. Sinuses/Orbits: No fluid levels or advanced mucosal thickening. No mastoid effusion. Normal orbits. IMPRESSION: 1. Severe white matter disease in a pattern consistent with multiple sclerosis. Unchanged distribution of T2 hyperintense lesions. 2. Punctate focus of diffusion restriction at the posterior aspect of the left basal ganglia. These may indicate a tiny area of acute ischemia; however, active demyelinating MS plaques may uncommonly show diffusion restriction. Contrast administration might be helpful for differentiation, if necessary. Electronically Signed   By: Ulyses Jarred M.D.   On: 08/05/2016 03:27   Mr Brain W Contrast  Result Date: 08/05/2016 CLINICAL DATA:  51 year old female with history of multiple sclerosis and quadriplegia presenting with confusion and slurred speech 2 days ago. Cough with patient noted to be hypoxic on room air. Subsequent encounter. EXAM:  MRI HEAD WITH CONTRAST TECHNIQUE: Multiplanar, multiecho pulse sequences of the brain and surrounding structures were obtained with intravenous contrast. CONTRAST:  35m MULTIHANCE GADOBENATE DIMEGLUMINE 529 MG/ML IV SOLN COMPARISON:  None. FINDINGS: Brain: Multiple white matter lesions consistent with demyelinating plaques none of which demonstrate enhancement as can be seen with active area of demyelination. No associated mass effect as can be seen with progressive multifocal leukoencephalopathy. Punctate region of restricted motion posterior limb of the right internal capsule may represent a small acute infarct or tiny area of active demyelination. Global atrophy. Anterior left middle cranial fossa arachnoid cyst. Vascular: Major intracranial vascular structures are patent. Skull and upper cervical spine: No acute abnormality. Sinuses/Orbits: No abnormal enhancement of the optic nerves. Other: Partially empty sella incidentally noted. IMPRESSION: Multiple white matter lesions consistent with demyelinating plaques none of which demonstrate enhancement as can be seen with active area of demyelination. No associated mass effect as can be seen with progressive multifocal leukoencephalopathy. Punctate region of restricted motion posterior limb of the right internal capsule may represent a tiny acute infarct although it is still possible this represents a tiny area of active demyelination which does not enhance. Electronically Signed   By: SGenia DelM.D.   On: 08/05/2016 17:46   Dg Chest Port 1 View  Result Date: 08/15/2016 CLINICAL DATA:  Cough and fever.  Left lower lobe pneumonia. EXAM: PORTABLE CHEST 1 VIEW COMPARISON:  08/12/2016 FINDINGS: There has been slight improvement in the left lower lobe consolidation since the prior study. Right lung remains clear. Heart size and vascularity are normal. No acute bone abnormality. IMPRESSION: Partial clearing of the left lower lobe pneumonia. Electronically Signed  By: Lorriane Shire M.D.   On: 08/15/2016 10:59   Dg Chest Port 1 View  Result Date: 08/12/2016 CLINICAL DATA:  Chest pain, shortness of Breath EXAM: PORTABLE CHEST 1 VIEW COMPARISON:  08/10/2016 FINDINGS: Cardiomegaly. No confluent opacity on the right. Focal airspace opacity in the left lower lobe. No definite effusion or acute bony abnormality. IMPRESSION: Cardiomegaly. Left lower lobe opacity.  Cannot exclude pneumonia. Electronically Signed   By: Rolm Baptise M.D.   On: 08/12/2016 12:26   Dg Chest Port 1 View  Result Date: 08/10/2016 CLINICAL DATA:  Bilateral pneumonia, cough, congestion EXAM: PORTABLE CHEST 1 VIEW COMPARISON:  08/09/2016 FINDINGS: There is mild bilateral interstitial thickening with significant interval improvement in the left mid and lower lung airspace opacities. There is no pleural effusion or pneumothorax. The heart mediastinum are stable. The osseous structures are unremarkable. IMPRESSION: 1. There is mild bilateral interstitial thickening with significant interval improvement in the left mid and lower lung airspace opacities. Electronically Signed   By: Kathreen Devoid   On: 08/10/2016 07:56   Dg Chest Port 1 View  Result Date: 08/09/2016 CLINICAL DATA:  Cough and congestion EXAM: PORTABLE CHEST 1 VIEW COMPARISON:  August 08, 2016 FINDINGS: There is persistent airspace consolidation throughout the left mid lower lung zones with small left pleural effusion. The right lung is clear. Heart is mildly enlarged with pulmonary vascularity within normal limits. No adenopathy. No bone lesions. IMPRESSION: Persistent extensive airspace consolidation, likely pneumonia, on the left with small left pleural effusion. Right lung clear. Stable cardiac prominence. Followup PA and lateral chest radiographs recommended in 3-4 weeks following trial of antibiotic therapy to ensure resolution and exclude underlying malignancy. Electronically Signed   By: Lowella Grip III M.D.   On:  08/09/2016 09:05   Dg Chest Port 1 View  Result Date: 08/08/2016 CLINICAL DATA:  Hypoxia EXAM: PORTABLE CHEST 1 VIEW COMPARISON:  August 08, 2016 study obtained earlier in the day FINDINGS: There is airspace consolidation throughout the mid and lower lung zones. There has been partial clearing of infiltrate from the right base. No new opacity is evident. There is cardiomegaly with pulmonary vascularity within normal limits. No adenopathy. No bone lesions. IMPRESSION: Interval partial clearing from right base. Persistent consolidation left mid and lower lung zones. Stable cardiomegaly. Electronically Signed   By: Lowella Grip III M.D.   On: 08/08/2016 15:53   Dg Chest Port 1 View  Result Date: 08/08/2016 CLINICAL DATA:  51 year old female with a history of shortness of breath EXAM: PORTABLE CHEST 1 VIEW COMPARISON:  08/07/2016, 08/04/2016 FINDINGS: Cardiomediastinal silhouette unchanged. Compared to the most recent chest x-ray there is persisting mixed interstitial and airspace opacity of the bilateral mid and lower lobes. Mild interlobular septal thickening. Obscuration of left hemidiaphragm and retrocardiac region. No pneumothorax. IMPRESSION: Persisting bilateral interstitial and airspace opacity, potentially representing a combination of edema, consolidation, atelectasis, and/or small pleural fluid. Signed, Dulcy Fanny. Earleen Newport, DO [facility] Electronically Signed   By: Corrie Mckusick D.O.   On: 08/08/2016 07:53   Dg Chest Port 1 View  Result Date: 08/07/2016 CLINICAL DATA:  Acute onset of hypoxia.  Initial encounter. EXAM: PORTABLE CHEST 1 VIEW COMPARISON:  Chest radiograph performed earlier today at 8:14 a.m. FINDINGS: Worsening bibasilar and bilateral central airspace opacification raises concern for worsening pneumonia, though pulmonary edema might have a similar appearance. A small left pleural effusion is noted. No pneumothorax is  seen. The cardiomediastinal silhouette is mildly  enlarged. No acute osseous abnormalities are seen. IMPRESSION: Worsening bibasilar and bilateral central airspace opacification raises concern for worsening pneumonia, though pulmonary edema might have a similar appearance. Small left pleural effusion noted. Mild cardiomegaly. Electronically Signed   By: Garald Balding M.D.   On: 08/07/2016 18:23   Dg Chest Port 1 View  Result Date: 08/07/2016 CLINICAL DATA:  Congestion and cough. EXAM: PORTABLE CHEST 1 VIEW COMPARISON:  08/04/2016 FINDINGS: Lungs are adequately inflated with persistent airspace opacification over the left mid to lower lung suggesting pneumonia. No definite effusion. Right lung is clear. Mild stable cardiomegaly. Remainder of the exam is unchanged. IMPRESSION: Persistent airspace process over the left mid to lower lung likely a pneumonia. Electronically Signed   By: Marin Olp M.D.   On: 08/07/2016 09:01    Lab Data:  CBC:  Recent Labs Lab 08/13/16 0726 08/15/16 1651 08/16/16 0631  WBC 7.8 8.0 8.0  HGB 10.5* 10.8* 11.0*  HCT 32.4* 34.2* 35.2*  MCV 97.9 100.3* 100.9*  PLT 200 218 782   Basic Metabolic Panel:  Recent Labs Lab 08/10/16 0438 08/11/16 0528 08/12/16 0458 08/13/16 0726 08/15/16 1651 08/16/16 0631  NA 134* 137  --  138 140 140  K 3.4* 3.5  --  3.0* 3.8 4.0  CL 92* 96*  --  94* 98* 95*  CO2 29 29  --  35* 28 33*  GLUCOSE 87 116*  --  114* 101* 111*  BUN 16 16  --  23* 23* 30*  CREATININE 0.97 1.07* 1.11* 1.18* 1.12* 1.17*  CALCIUM 8.3* 8.3*  --  9.4 9.2 9.6   GFR: Estimated Creatinine Clearance: 54 mL/min (by C-G formula based on SCr of 1.17 mg/dL (H)). Liver Function Tests: No results for input(s): AST, ALT, ALKPHOS, BILITOT, PROT, ALBUMIN in the last 168 hours. No results for input(s): LIPASE, AMYLASE in the last 168 hours. No results for input(s): AMMONIA in the last 168 hours. Coagulation Profile: No results for input(s): INR, PROTIME in  the last 168 hours. Cardiac Enzymes: No results for input(s): CKTOTAL, CKMB, CKMBINDEX, TROPONINI in the last 168 hours. BNP (last 3 results) No results for input(s): PROBNP in the last 8760 hours. HbA1C: No results for input(s): HGBA1C in the last 72 hours. CBG:  Recent Labs Lab 08/15/16 0717  GLUCAP 99   Lipid Profile: No results for input(s): CHOL, HDL, LDLCALC, TRIG, CHOLHDL, LDLDIRECT in the last 72 hours. Thyroid Function Tests: No results for input(s): TSH, T4TOTAL, FREET4, T3FREE, THYROIDAB in the last 72 hours. Anemia Panel: No results for input(s): VITAMINB12, FOLATE, FERRITIN, TIBC, IRON, RETICCTPCT in the last 72 hours. Urine analysis:    Component Value Date/Time   COLORURINE AMBER (A) 05/31/2013 0237   APPEARANCEUR CLOUDY (A) 05/31/2013 0237   LABSPEC 1.027 05/31/2013 0237   PHURINE 5.5 05/31/2013 0237   GLUCOSEU NEGATIVE 05/31/2013 0237   HGBUR NEGATIVE 05/31/2013 0237   BILIRUBINUR SMALL (A) 05/31/2013 0237   KETONESUR 15 (A) 05/31/2013 0237   PROTEINUR NEGATIVE 05/31/2013 0237   UROBILINOGEN 0.2 05/31/2013 0237   NITRITE NEGATIVE 05/31/2013 0237   LEUKOCYTESUR NEGATIVE 05/31/2013 0237     RAI,RIPUDEEP M.D. [facility] 08/16/2016, 3:29 PM  Pager: [phone] Between 7am to 7pm - call Pager - [phone]  After 7pm go to [email or web address] - password [email or web address]  Call night coverage person covering after 7pm

## 2016-08-17 ENCOUNTER — Inpatient Hospital Stay (HOSPITAL_COMMUNITY): Payer: Managed Care, Other (non HMO)

## 2016-08-17 ENCOUNTER — Telehealth: Payer: Self-pay | Admitting: Neurology

## 2016-08-17 ENCOUNTER — Ambulatory Visit: Payer: Managed Care, Other (non HMO) | Admitting: Neurology

## 2016-08-17 LAB — BASIC METABOLIC PANEL
Anion gap: 10 (ref 5–15)
BUN: 30 mg/dL — ABNORMAL HIGH (ref 6–20)
CALCIUM: 9.2 mg/dL (ref 8.9–10.3)
CHLORIDE: 96 mmol/L — AB (ref 101–111)
CO2: 33 mmol/L — ABNORMAL HIGH (ref 22–32)
CREATININE: 1.19 mg/dL — AB (ref 0.44–1.00)
GFR calc non Af Amer: 52 mL/min — ABNORMAL LOW (ref >60)
Glucose, Bld: 116 mg/dL — ABNORMAL HIGH (ref 65–99)
Potassium: 4.1 mmol/L (ref 3.5–5.1)
SODIUM: 139 mmol/L (ref 135–145)

## 2016-08-17 LAB — LAMOTRIGINE LEVEL: Lamotrigine Lvl: 19.9 ug/mL (ref 2.0–20.0)

## 2016-08-17 MED ORDER — GADOBENATE DIMEGLUMINE 529 MG/ML IV SOLN
17.0000 mL | Freq: Once | INTRAVENOUS | Status: AC | PRN
  Administered 2016-08-17: 17 mL via INTRAVENOUS

## 2016-08-17 MED ORDER — LORAZEPAM 2 MG/ML IJ SOLN
INTRAMUSCULAR | Status: AC
  Administered 2016-08-17: 0.25 mg via INTRAVENOUS
  Filled 2016-08-17: qty 1

## 2016-08-17 MED ORDER — LORAZEPAM 2 MG/ML IJ SOLN
0.2500 mg | Freq: Once | INTRAMUSCULAR | Status: AC
  Administered 2016-08-17: 0.25 mg via INTRAVENOUS

## 2016-08-17 MED ORDER — SODIUM CHLORIDE 0.9 % IV SOLN
500.0000 mg | Freq: Two times a day (BID) | INTRAVENOUS | Status: DC
  Administered 2016-08-17 – 2016-08-18 (×2): 500 mg via INTRAVENOUS
  Filled 2016-08-17 (×3): qty 4

## 2016-08-17 NOTE — Telephone Encounter (Signed)
RAS has spoken with hospitalist/fim

## 2016-08-17 NOTE — Telephone Encounter (Signed)
I spoke with Dr. Leonel Ramsay about Vanessa Short.   She is currently in [employer]. She has had an encephalopathy and there are some diffusion weighted changes on the MRI. He will be checking a contrasted MRI and get an MR angiogram. She will get a couple days of IV steroids.

## 2016-08-17 NOTE — Telephone Encounter (Signed)
Dr. Kathrynn Speed would like a returned call to discuss the patient. He can be reached at [phone].

## 2016-08-17 NOTE — Progress Notes (Signed)
Subjective: Patient is awake, opens her eyes to my voice, will look left and right to my finger. Follows no commands. Per Dr. Salome Arnt just prior she was answering yes and no questions. Would no answer questions about if she was depressed.   Exam: Vitals:   08/17/16 0020 08/17/16 0628  BP: 99/62 (!) 145/79  Pulse:    Resp: 13   Temp:  98.1 F (36.7 C)       Gen: In bed, NAD MS: Awake, tracts fingers, face symmetric, swallowing. No verbal output for me .  CN:2-12 intact. Tends to hold jaw shut Motor: moves no extremities purposefully but will not allow her hand to hit her ead when held above head.  Sensory: winces to pain in all extremities.    Pertinent Labs/Diagnostics: MRI HEAD: Mild to moderately motion degraded examination. Numerous foci of reduced diffusion most consistent with hyperacute/active demyelination, less likely infection/septic emboli. Severe chronic demyelination including supra and infratentorial white matter, cortex and deep gray nuclei lesions.  Moderate parenchymal brain volume loss for age.  MRI CERVICAL SPINE: Moderately motion degraded examination. C5 myelomalacia, additional spinal cord plaques consistent with chronic demyelination, relatively similar though limited assessment due to patient motion.  No canal stenosis. Possible mild RIGHT C3-4 neural foraminal narrowing   Etta Quill [name] [facility] [phone]  By the time of my exam she had "popped out of it"  Impression:  51 year old female with multiple sclerosis with altered mental status. The sudden improvement in her mental status does: The question whether multiple sclerosis was the instigator. I did have some concerns that conversion reaction may have been playing a role and I think that her rapid improvement would be more supportive of this diagnosis than others.  That being said, she does have evidence of disease activity on her MRI, and I think that steroids still may be  beneficial to her.  Recommendations: 1) Solu-Medrol 500 mg twice a day 2) MRI brain with/without contrast, MRA head 3) we will follow    08/17/2016, 10:10 AM

## 2016-08-17 NOTE — Progress Notes (Signed)
[facility]                                                                              Patient Demographics  Vanessa Short, is a 51 y.o. female, DOB - 08/13/1966, OQH:[id number]  Admit date - 08/04/2016   Admitting Physician Edwin Dada, MD  Outpatient Primary MD for the patient is [place]  Outpatient specialists:   LOS - 12  days    Chief Complaint  Patient presents with  . Nasal Congestion  . Fatigue       Brief summary   51 y.o.femalewith a past medical history significant for multiple sclerosis with quadruplegiawho presented with 2 days cough as well as confusion and slurred speech, Workup significant for CAP, Transferred to stepdown on 12/25 giving respiratory distress and worsening hypoxia, Chest x-ray with worsening bilateral infiltrate, worsening pneumonia versus volume overload, 2-D echo with diastolic dysfunction,respiratory status improved with IV diuresis, respiratory panel significant for metapneumovirus. Patient was seen by physical therapy. It was felt that she would benefit from short-term rehabilitation.    Assessment & Plan    Acute encephalopathy - Unclear etiology, although at the time of my examination, patient kept her eyes closed but was listening to everything and followed all the commands. She had received pain medication earlier and was comfortable. - CT head 1/2 showed extensive demyelination with superimposed small vessel disease no acute infarct hemorrhage or edema. - EEG was nonspecific but showed no seizures - Neurology consulted, recommended MRI brain, C-spine. MRI brain showed numerous hyperacute demyelination, severe chronic demyelination, MRI cervical spine showed C5 mild low malacia, additional spinal cord proximal consistent with chronic demyelination stenosis. Discussed with Dr. Karsten Fells on phone, recommending possibly starting on high-dose IV steroids and treat as  possible MS flare, will follow recommendations.  - PT evaluation recommending skilled nursing facility - Per psychiatry, severe major depression, recommended Trileptal, Prozac, Lamictal   Community-acquired Pneumonia - Chest x-ray was significant for right middle lobe, left lower lobe pneumonia, Treated for total of 7 days of IV Rocephin and azithromycin . - urine strep antigennegative, Respiratory panel positive for metapneumovirus virus - RT for chest physiotherapy, IS, flutter valve given very poor cough mechanics - Pro calcitonin normalized,afebrile, no leukocytosis. Chest x-ray repeated 12/30 continues to show a left lower lobe opacity. Chest x-ray 1/2 showed partial clearing of left lower lobe pneumonia   Acute hypoxic respiratory failure - Secondary to pneumonia , chest x-ray with worsening pneumonia since admission, respiratory failure most likely due to pneumonia, and poor cough mechanics, pulmonary input appreciated -  continue chest PT and nebulizers, supplemental O2 as needed  Acute diastolic CHF/hypokalemia - 2-D echo with preserved EF but with grade 2 diastolic dysfunction, -  respiratory status improving with diuresis. Chest x-ray does not show any evidence of fluid overload.  - Continue oral diuretics.  History of MS with quadriplegia - Continue Aubagio, dantrolene and baclofen - Continue amitriptyline and lamotrigine and Trileptal for dysesthesias - Continue Vesicare  Anemia: - Chronic, unchanged.  Slurred speech - Suspect a metabolic encephalopathy from pneumonia in setting of MS.  - MRI brain with  abnormal findings in basal ganglia, ischemic versus active demyelination. Neurology was consulted. Concern for stroke versus tiny demyelinating plaque, stroke diagnosis is not certain.  - Started on aspirin, Lipitor, 2-D echo with no embolic source, carotid Doppler with no significant ICA stenosis, PT/OT .  Ulcerative colitis - Continue mesalamine.   Major  Depression - Psychiatry consulted, recommended continue Trileptal, Prozac, Lamictal  Essential Hypertension - Continue labetalol  Other medications: - Continue acyclovir  Diarrhea - C. difficile negative. Diarrhea has resolved. Hasn't had a BM in few days. Initiated stool softener.  Code Status: Full code DVT Prophylaxis:  Lovenox  Family Communication: Discussed in detail with the patient, all imaging results, lab results explained to the patient. Discussed with patient's husband yesterday    Disposition Plan: Awaiting neurology recommendations  Time Spent in minutes   25 minutes  Procedures:  MRI brain , C-spine   EEG  Consultants:   Neurology   psychiatry  Antimicrobials:   Zithromax completed  Rocephin completed    Medications  Scheduled Meds: . acyclovir  400 mg Oral Daily  . albuterol  2.5 mg Nebulization BID  . amitriptyline  75 mg Oral QHS  . aspirin  81 mg Oral Daily  . atorvastatin  20 mg Oral q1800  . baclofen  10 mg Oral BID  . baclofen  15 mg Oral QHS  . bisoprolol  5 mg Oral Daily  . dantrolene  50 mg Oral TID AC & HS  . darifenacin  15 mg Oral Daily  . docusate sodium  100 mg Oral BID  . enoxaparin (LOVENOX) injection  40 mg Subcutaneous Daily  . feeding supplement (ENSURE ENLIVE)  237 mL Oral BID BM  . FLUoxetine  40 mg Oral Daily  . furosemide  40 mg Oral BID  . Gerhardt's butt cream   Topical BID  . guaiFENesin  1,200 mg Oral BID  . lamoTRIgine  200 mg Oral TID  . LORazepam  1 mg Oral Once  . mesalamine  1,000 mg Oral TID  . OXcarbazepine  150 mg Oral TID  . pantoprazole  40 mg Oral BID AC  . polyethylene glycol  17 g Oral Daily  . Teriflunomide  1 tablet Oral Daily   Continuous Infusions: PRN Meds:.acetaminophen **OR** acetaminophen, diphenoxylate-atropine, LORazepam, ondansetron **OR** ondansetron (ZOFRAN) IV, oxyCODONE-acetaminophen, sodium chloride   Antibiotics   Anti-infectives    Start     Dose/Rate Route Frequency  Ordered Stop   08/06/16 2200  cefTRIAXone (ROCEPHIN) 1 g in dextrose 5 % 50 mL IVPB  Status:  Discontinued     1 g 100 mL/hr over 30 Minutes Intravenous Every 24 hours 08/05/16 0251 08/10/16 1156   08/06/16 2200  azithromycin (ZITHROMAX) tablet 500 mg  Status:  Discontinued     500 mg Oral Every 24 hours 08/05/16 0251 08/10/16 1156   08/05/16 1000  acyclovir (ZOVIRAX) tablet 400 mg    Comments:  Take every day per patient     400 mg Oral Daily 08/05/16 0251     08/04/16 2300  cefTRIAXone (ROCEPHIN) 1 g in dextrose 5 % 50 mL IVPB     1 g 100 mL/hr over 30 Minutes Intravenous  Once 08/04/16 2252 08/05/16 0019   08/04/16 2300  azithromycin (ZITHROMAX) 500 mg in dextrose 5 % 250 mL IVPB     500 mg 250 mL/hr over 60 Minutes Intravenous  Once 08/04/16 2252 08/05/16 0128        Subjective:   Braylinn Willhelm was  seen and examined today.  The patient was alert and awake during the examination, answered with yes and no and in 1-2 words, followed simple commands. Upon asking if she is in any pain, patient reported no appeared to be comfortable.   no fevers and chills, no acute issues overnight.   Objective:   Vitals:   08/16/16 2052 08/16/16 2132 08/17/16 0020 08/17/16 0628  BP:   99/62 (!) 145/79  Pulse:      Resp:   13   Temp:  98.2 F (36.8 C)  98.1 F (36.7 C)  TempSrc:  Oral  Oral  SpO2: 98%     Weight:    78 kg (172 lb)  Height:        Intake/Output Summary (Last 24 hours) at 08/17/16 1218 Last data filed at 08/17/16 0835  Gross per 24 hour  Intake              340 ml  Output                0 ml  Net              340 ml     Wt Readings from Last 3 Encounters:  08/17/16 78 kg (172 lb)  05/09/16 79.4 kg (175 lb)  11/09/15 79.4 kg (175 lb)     Exam  General: Alert and oriented, Answers with soft voice and follows simple commands during the examination, NAD   HEENT:    Neck:   Cardiovascular: S1 S2 auscultated, no rubs, murmurs or gallops. Regular rate and  rhythm.  Respiratory: Clear to auscultation bilaterally, no wheezing, rales or rhonchi  Gastrointestinal: Soft, nontender, nondistended, + bowel sounds  Ext: no cyanosis clubbing or edema  Neuro:  difficult to assess neurological examination   Skin: No rashes  Psych: Normal affect and demeanor, alert and oriented x self    Data Reviewed:  I have personally reviewed following labs and imaging studies  Micro Results Recent Results (from the past 240 hour(s))  Respiratory Panel by PCR     Status: Abnormal   Collection Time: 08/08/16  3:12 PM  Result Value Ref Range Status   Adenovirus NOT DETECTED NOT DETECTED Final   Coronavirus 229E NOT DETECTED NOT DETECTED Final   Coronavirus HKU1 NOT DETECTED NOT DETECTED Final   Coronavirus NL63 NOT DETECTED NOT DETECTED Final   Coronavirus OC43 NOT DETECTED NOT DETECTED Final   Metapneumovirus DETECTED (A) NOT DETECTED Final   Rhinovirus / Enterovirus NOT DETECTED NOT DETECTED Final   Influenza A NOT DETECTED NOT DETECTED Final   Influenza B NOT DETECTED NOT DETECTED Final   Parainfluenza Virus 1 NOT DETECTED NOT DETECTED Final   Parainfluenza Virus 2 NOT DETECTED NOT DETECTED Final   Parainfluenza Virus 3 NOT DETECTED NOT DETECTED Final   Parainfluenza Virus 4 NOT DETECTED NOT DETECTED Final   Respiratory Syncytial Virus NOT DETECTED NOT DETECTED Final   Bordetella pertussis NOT DETECTED NOT DETECTED Final   Chlamydophila pneumoniae NOT DETECTED NOT DETECTED Final   Mycoplasma pneumoniae NOT DETECTED NOT DETECTED Final  MRSA PCR Screening     Status: None   Collection Time: 08/09/16 11:48 AM  Result Value Ref Range Status   MRSA by PCR NEGATIVE NEGATIVE Final    Comment:        The GeneXpert MRSA Assay (FDA approved for NASAL specimens only), is one component of a comprehensive MRSA colonization surveillance program. It is not intended to diagnose MRSA infection  nor to guide or monitor treatment for MRSA infections.   C  difficile quick scan w PCR reflex     Status: None   Collection Time: 08/09/16  2:28 PM  Result Value Ref Range Status   C Diff antigen NEGATIVE NEGATIVE Final   C Diff toxin NEGATIVE NEGATIVE Final   C Diff interpretation No C. difficile detected.  Final    Radiology Reports Dg Chest 2 View  Result Date: 08/04/2016 CLINICAL DATA:  Hypoxia with cough EXAM: CHEST  2 VIEW COMPARISON:  05/30/2013 FINDINGS: Right lung is grossly clear. Airspace opacity is present within the left lower lobe and lingula consistent with pneumonia. Small left pleural effusion. Stable mild cardiomegaly. There is mild central vascular congestion. No pneumothorax. Scoliosis of the lower spine. IMPRESSION: 1. Moderate left mid and lower lung infiltrates with small left pleural effusion. 2. Mild cardiomegaly with central vascular congestion Electronically Signed   By: Donavan Foil M.D.   On: 08/04/2016 22:29   Ct Head Wo Contrast  Result Date: 08/15/2016 CLINICAL DATA:  Altered mental status. History of multiple sclerosis EXAM: CT HEAD WITHOUT CONTRAST TECHNIQUE: Contiguous axial images were obtained from the base of the skull through the vertex without intravenous contrast. COMPARISON:  Head CT August 04, 2016 and brain MRI August 05, 2016 FINDINGS: Brain: Mild diffuse atrophy is stable. There is an apparent arachnoid cyst with remodeling in the anterior left temporal region measuring 3.0 x 1.3 cm, stable. There is no new mass, hemorrhage, extra-axial fluid collection, or midline shift. Extensive decreased attenuation throughout the periventricular white matter remains stable. No new lesion is evident in gray-white compartment regions. Vascular: There is no hyperdense vessel. There is no appreciable vascular calcification. Skull: The bony calvarium appears intact. Sinuses/Orbits: There are multiple areas of opacification in ethmoid air cells bilaterally. There is a small air-fluid level in the right sphenoid region. Other  paranasal sinuses are clear. Orbits appear symmetric bilaterally. Other: Mastoid air cells are clear. IMPRESSION: Widespread decreased attenuation in the supratentorial white matter, likely due to extensive demyelination. There may be a degree of superimposed small vessel disease. At over acute appearing infarct is not evident. No hemorrhage or edema seen. 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 No new mass. There is multifocal paranasal sinus disease with air-fluid level in the right sphenoid sinus. With respect to assessment for potential active foci of demyelinating plaque, MR pre and post-contrast would be the imaging study of choice to further assess. Electronically Signed   By: Lowella Grip III M.D.   On: 08/15/2016 12:29   Ct Head Wo Contrast  Result Date: 08/04/2016 CLINICAL DATA:  Slurred speech and lightheadedness overnight. EXAM: CT HEAD WITHOUT CONTRAST TECHNIQUE: Contiguous axial images were obtained from the base of the skull through the vertex without intravenous contrast. COMPARISON:  01/24/2015 FINDINGS: Brain: There is no intracranial hemorrhage, mass or evidence of acute infarction. There is moderate generalized atrophy. There is moderate chronic microvascular ischemic change. There is no significant extra-axial fluid collection. No acute intracranial findings are evident. Vascular: No hyperdense vessel or unexpected calcification. Skull: Normal. Negative for fracture or focal lesion. Sinuses/Orbits: No acute finding. Other: None. IMPRESSION: No acute intracranial findings. There is moderate generalized atrophy and chronic appearing white matter hypodensities which likely represent small vessel ischemic disease. Electronically Signed   By: Andreas Newport M.D.   On: 08/04/2016 18:22   Mr Brain Wo Contrast  Result Date: 08/17/2016 CLINICAL DATA:  Altered mental status, history  of multiple sclerosis. EXAM: MRI HEAD WITHOUT CONTRAST MRI CERVICAL SPINE WITHOUT  CONTRAST TECHNIQUE: Multiplanar, multiecho pulse sequences of the brain and surrounding structures, and cervical spine, to include the craniocervical junction and cervicothoracic junction, were obtained without intravenous contrast. COMPARISON:  MRI head August 05, 2016 and MRI of the cervical spine January 24, 2015 FINDINGS: MRI HEAD FINDINGS- mild to moderately motion degraded examination. BRAIN: Multiple punctate to subcentimeter foci of reduced diffusion largest within RIGHT mesial occipital lobe with rounded morphology, limited assessment on ADC map due to small size with no definite ADC abnormality. Given differences in field strength, these are similar to slightly worse. Numerous white matter ovoid T2 hyperintensities, dominant lesion and LEFT parietal lobe measuring 16 mm. Lesions demonstrate low T1 signal consistent with black holes of demyelination. Subcentimeter bilateral cerebellar lesions. Confluent white matter FLAIR T2 hyperintensities. Cortical RIGHT thalamic and probable bilateral basal ganglia lesions. Moderate ventriculomegaly on the basis of global parenchymal brain volume loss. No susceptibility artifact to suggest hemorrhage. No abnormal extra-axial fluid collections. Small LEFT middle cranial fossa arachnoid cyst. VASCULAR: Normal major intracranial vascular flow voids present at skull base. SKULL AND UPPER CERVICAL SPINE: No abnormal sellar expansion. No suspicious calvarial bone marrow signal. Craniocervical junction maintained. SINUSES/ORBITS: The mastoid air-cells and included paranasal sinuses are well-aerated. The included ocular globes and orbital contents are non-suspicious. OTHER: None. MRI CERVICAL SPINE FINDINGS- moderately motion degraded examination. ALIGNMENT: Maintain cervical lordosis.  No malalignment. VERTEBRAE/DISCS: Vertebral bodies are intact. Intervertebral disc morphology's and signal are normal. CORD:17 mm segment of myelomalacia at C5, present on prior imaging. Patchy  white matter T2 hyperintense signal upper cervical spinal cord, likely within the included thoracic spinal cord though limited by motion. POSTERIOR FOSSA, VERTEBRAL ARTERIES, PARASPINAL TISSUES: No MR findings of ligamentous injury. Vertebral artery flow voids present. Included posterior fossa and paraspinal soft tissues are normal. DISC LEVELS: Limited assessment due to motion degraded examination. Mid cervical facet arthropathy without canal stenosis at any level. Possible mild RIGHT C3-4 neural foraminal narrowing. IMPRESSION: MRI HEAD: Mild to moderately motion degraded examination. Numerous foci of reduced diffusion most consistent with hyperacute/active demyelination, less likely infection/septic emboli. Severe chronic demyelination including supra and infratentorial white matter, cortex and deep gray nuclei lesions. Moderate parenchymal brain volume loss for age. MRI CERVICAL SPINE: Moderately motion degraded examination. C5 myelomalacia, additional spinal cord plaques consistent with chronic demyelination, relatively similar though limited assessment due to patient motion. No canal stenosis. Possible mild RIGHT C3-4 neural foraminal narrowing. Electronically Signed   By: Elon Alas M.D.   On: 08/17/2016 00:32   Mr Brain Wo Contrast  Result Date: 08/05/2016 CLINICAL DATA:  Altered mental status EXAM: MRI HEAD WITHOUT CONTRAST TECHNIQUE: Multiplanar, multiecho pulse sequences of the brain and surrounding structures were obtained without intravenous contrast. COMPARISON:  Head CT 08/04/2016 Brain MRI 01/24/2015 FINDINGS: Brain: Punctate focus of high signal on the diffusion-weighted images at the posterior aspect of the right basal ganglia is unchanged compared to 01/24/2015. There is an additional punctate focus of diffusion restriction in the posterior aspect of the left basal ganglia There is severe white matter disease with confluent lesions in the periventricular white matter, oriented  perpendicularly to the long axis of the lateral ventricles. There is atrophy of the corpus callosum. There are numerous low T1 weighted signal lesions. No mass lesion or midline shift. No hydrocephalus or extra-axial fluid collection. There is a left middle cranial fossa arachnoid cyst. 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 Vascular: Major intracranial arterial and venous sinus flow voids are preserved. No evidence of chronic microhemorrhage or amyloid angiopathy. Skull and upper cervical spine: The visualized skull base, calvarium, upper cervical spine and extracranial soft tissues are normal. Sinuses/Orbits: No fluid levels or advanced mucosal thickening. No mastoid effusion. Normal orbits. IMPRESSION: 1. Severe white matter disease in a pattern consistent with multiple sclerosis. Unchanged distribution of T2 hyperintense lesions. 2. Punctate focus of diffusion restriction at the posterior aspect of the left basal ganglia. These may indicate a tiny area of acute ischemia; however, active demyelinating MS plaques may uncommonly show diffusion restriction. Contrast administration might be helpful for differentiation, if necessary. Electronically Signed   By: Ulyses Jarred M.D.   On: 08/05/2016 03:27   Mr Brain W Contrast  Result Date: 08/05/2016 CLINICAL DATA:  51 year old female with history of multiple sclerosis and quadriplegia presenting with confusion and slurred speech 2 days ago. Cough with patient noted to be hypoxic on room air. Subsequent encounter. EXAM: MRI HEAD WITH CONTRAST TECHNIQUE: Multiplanar, multiecho pulse sequences of the brain and surrounding structures were obtained with intravenous contrast. CONTRAST:  42m MULTIHANCE GADOBENATE DIMEGLUMINE 529 MG/ML IV SOLN COMPARISON:  None. FINDINGS: Brain: Multiple white matter lesions consistent with demyelinating plaques none of which demonstrate enhancement as can be seen with active area of  demyelination. No associated mass effect as can be seen with progressive multifocal leukoencephalopathy. Punctate region of restricted motion posterior limb of the right internal capsule may represent a small acute infarct or tiny area of active demyelination. Global atrophy. Anterior left middle cranial fossa arachnoid cyst. Vascular: Major intracranial vascular structures are patent. Skull and upper cervical spine: No acute abnormality. Sinuses/Orbits: No abnormal enhancement of the optic nerves. Other: Partially empty sella incidentally noted. IMPRESSION: Multiple white matter lesions consistent with demyelinating plaques none of which demonstrate enhancement as can be seen with active area of demyelination. No associated mass effect as can be seen with progressive multifocal leukoencephalopathy. Punctate region of restricted motion posterior limb of the right internal capsule may represent a tiny acute infarct although it is still possible this represents a tiny area of active demyelination which does not enhance. Electronically Signed   By: SGenia DelM.D.   On: 08/05/2016 17:46   Mr Cervical Spine Wo Contrast  Result Date: 08/17/2016 CLINICAL DATA:  Altered mental status, history of multiple sclerosis. EXAM: MRI HEAD WITHOUT CONTRAST MRI CERVICAL SPINE WITHOUT CONTRAST TECHNIQUE: Multiplanar, multiecho pulse sequences of the brain and surrounding structures, and cervical spine, to include the craniocervical junction and cervicothoracic junction, were obtained without intravenous contrast. COMPARISON:  MRI head August 05, 2016 and MRI of the cervical spine January 24, 2015 FINDINGS: MRI HEAD FINDINGS- mild to moderately motion degraded examination. BRAIN: Multiple punctate to subcentimeter foci of reduced diffusion largest within RIGHT mesial occipital lobe with rounded morphology, limited assessment on ADC map due to small size with no definite ADC abnormality. Given differences in field strength, these  are similar to slightly worse. Numerous white matter ovoid T2 hyperintensities, dominant lesion and LEFT parietal lobe measuring 16 mm. Lesions demonstrate low T1 signal consistent with black holes of demyelination. Subcentimeter bilateral cerebellar lesions. Confluent white matter FLAIR T2 hyperintensities. Cortical RIGHT thalamic and probable bilateral basal ganglia lesions. Moderate ventriculomegaly on the basis of global parenchymal brain volume loss. No susceptibility artifact to suggest hemorrhage. No abnormal extra-axial fluid collections. Small LEFT middle cranial fossa arachnoid cyst. VASCULAR: Normal major intracranial vascular flow voids present at  skull base. SKULL AND UPPER CERVICAL SPINE: No abnormal sellar expansion. No suspicious calvarial bone marrow signal. Craniocervical junction maintained. SINUSES/ORBITS: The mastoid air-cells and included paranasal sinuses are well-aerated. The included ocular globes and orbital contents are non-suspicious. OTHER: None. MRI CERVICAL SPINE FINDINGS- moderately motion degraded examination. ALIGNMENT: Maintain cervical lordosis.  No malalignment. VERTEBRAE/DISCS: Vertebral bodies are intact. Intervertebral disc morphology's and signal are normal. CORD:17 mm segment of myelomalacia at C5, present on prior imaging. Patchy white matter T2 hyperintense signal upper cervical spinal cord, likely within the included thoracic spinal cord though limited by motion. POSTERIOR FOSSA, VERTEBRAL ARTERIES, PARASPINAL TISSUES: No MR findings of ligamentous injury. Vertebral artery flow voids present. Included posterior fossa and paraspinal soft tissues are normal. DISC LEVELS: Limited assessment due to motion degraded examination. Mid cervical facet arthropathy without canal stenosis at any level. Possible mild RIGHT C3-4 neural foraminal narrowing. IMPRESSION: MRI HEAD: Mild to moderately motion degraded examination. Numerous foci of reduced diffusion most consistent with  hyperacute/active demyelination, less likely infection/septic emboli. Severe chronic demyelination including supra and infratentorial white matter, cortex and deep gray nuclei lesions. Moderate parenchymal brain volume loss for age. MRI CERVICAL SPINE: Moderately motion degraded examination. C5 myelomalacia, additional spinal cord plaques consistent with chronic demyelination, relatively similar though limited assessment due to patient motion. No canal stenosis. Possible mild RIGHT C3-4 neural foraminal narrowing. Electronically Signed   By: Elon Alas M.D.   On: 08/17/2016 00:32   Dg Chest Port 1 View  Result Date: 08/15/2016 CLINICAL DATA:  Cough and fever.  Left lower lobe pneumonia. EXAM: PORTABLE CHEST 1 VIEW COMPARISON:  08/12/2016 FINDINGS: There has been slight improvement in the left lower lobe consolidation since the prior study. Right lung remains clear. Heart size and vascularity are normal. No acute bone abnormality. IMPRESSION: Partial clearing of the left lower lobe pneumonia. Electronically Signed   By: Lorriane Shire M.D.   On: 08/15/2016 10:59   Dg Chest Port 1 View  Result Date: 08/12/2016 CLINICAL DATA:  Chest pain, shortness of Breath EXAM: PORTABLE CHEST 1 VIEW COMPARISON:  08/10/2016 FINDINGS: Cardiomegaly. No confluent opacity on the right. Focal airspace opacity in the left lower lobe. No definite effusion or acute bony abnormality. IMPRESSION: Cardiomegaly. Left lower lobe opacity.  Cannot exclude pneumonia. Electronically Signed   By: Rolm Baptise M.D.   On: 08/12/2016 12:26   Dg Chest Port 1 View  Result Date: 08/10/2016 CLINICAL DATA:  Bilateral pneumonia, cough, congestion EXAM: PORTABLE CHEST 1 VIEW COMPARISON:  08/09/2016 FINDINGS: There is mild bilateral interstitial thickening with significant interval improvement in the left mid and lower lung airspace opacities. There is no pleural effusion or pneumothorax. The heart mediastinum are stable. The osseous  structures are unremarkable. IMPRESSION: 1. There is mild bilateral interstitial thickening with significant interval improvement in the left mid and lower lung airspace opacities. Electronically Signed   By: Kathreen Devoid   On: 08/10/2016 07:56   Dg Chest Port 1 View  Result Date: 08/09/2016 CLINICAL DATA:  Cough and congestion EXAM: PORTABLE CHEST 1 VIEW COMPARISON:  August 08, 2016 FINDINGS: There is persistent airspace consolidation throughout the left mid lower lung zones with small left pleural effusion. The right lung is clear. Heart is mildly enlarged with pulmonary vascularity within normal limits. No adenopathy. No bone lesions. IMPRESSION: Persistent extensive airspace consolidation, likely pneumonia, on the left with small left pleural effusion. Right lung clear. Stable cardiac prominence. Followup PA and lateral chest radiographs recommended in 3-4 weeks following  trial of antibiotic therapy to ensure resolution and exclude underlying malignancy. Electronically Signed   By: Lowella Grip III M.D.   On: 08/09/2016 09:05   Dg Chest Port 1 View  Result Date: 08/08/2016 CLINICAL DATA:  Hypoxia EXAM: PORTABLE CHEST 1 VIEW COMPARISON:  August 08, 2016 study obtained earlier in the day FINDINGS: There is airspace consolidation throughout the mid and lower lung zones. There has been partial clearing of infiltrate from the right base. No new opacity is evident. There is cardiomegaly with pulmonary vascularity within normal limits. No adenopathy. No bone lesions. IMPRESSION: Interval partial clearing from right base. Persistent consolidation left mid and lower lung zones. Stable cardiomegaly. Electronically Signed   By: Lowella Grip III M.D.   On: 08/08/2016 15:53   Dg Chest Port 1 View  Result Date: 08/08/2016 CLINICAL DATA:  51 year old female with a history of shortness of breath EXAM: PORTABLE CHEST 1 VIEW COMPARISON:  08/07/2016, 08/04/2016 FINDINGS: Cardiomediastinal silhouette  unchanged. Compared to the most recent chest x-ray there is persisting mixed interstitial and airspace opacity of the bilateral mid and lower lobes. Mild interlobular septal thickening. Obscuration of left hemidiaphragm and retrocardiac region. No pneumothorax. IMPRESSION: Persisting bilateral interstitial and airspace opacity, potentially representing a combination of edema, consolidation, atelectasis, and/or small pleural fluid. Signed, Dulcy Fanny. Earleen Newport, DO [facility] Electronically Signed   By: Corrie Mckusick D.O.   On: 08/08/2016 07:53   Dg Chest Port 1 View  Result Date: 08/07/2016 CLINICAL DATA:  Acute onset of hypoxia.  Initial encounter. EXAM: PORTABLE CHEST 1 VIEW COMPARISON:  Chest radiograph performed earlier today at 8:14 a.m. FINDINGS: Worsening bibasilar and bilateral central airspace opacification raises concern for worsening pneumonia, though pulmonary edema might have a similar appearance. A small left pleural effusion is noted. No pneumothorax is seen. The cardiomediastinal silhouette is mildly enlarged. No acute osseous abnormalities are seen. IMPRESSION: Worsening bibasilar and bilateral central airspace opacification raises concern for worsening pneumonia, though pulmonary edema might have a similar appearance. Small left pleural effusion noted. Mild cardiomegaly. Electronically Signed   By: Garald Balding M.D.   On: 08/07/2016 18:23   Dg Chest Port 1 View  Result Date: 08/07/2016 CLINICAL DATA:  Congestion and cough. EXAM: PORTABLE CHEST 1 VIEW COMPARISON:  08/04/2016 FINDINGS: Lungs are adequately inflated with persistent airspace opacification over the left mid to lower lung suggesting pneumonia. No definite effusion. Right lung is clear. Mild stable cardiomegaly. Remainder of the exam is unchanged. IMPRESSION: Persistent airspace process over the left mid to lower lung likely a pneumonia. Electronically Signed   By: Marin Olp M.D.   On: 08/07/2016 09:01    Lab Data:  CBC:  Recent Labs Lab 08/13/16 0726 08/15/16 1651 08/16/16 0631  WBC 7.8 8.0 8.0  HGB 10.5* 10.8* 11.0*  HCT 32.4* 34.2* 35.2*  MCV 97.9 100.3* 100.9*  PLT 200 218 263   Basic Metabolic Panel:  Recent Labs Lab 08/11/16 0528 08/12/16 0458 08/13/16 0726 08/15/16 1651 08/16/16 0631 08/17/16 0314  NA 137  --  138 140 140 139  K 3.5  --  3.0* 3.8 4.0 4.1  CL 96*  --  94* 98* 95* 96*  CO2 29  --  35* 28 33* 33*  GLUCOSE 116*  --  114* 101* 111* 116*  BUN 16  --  23* 23* 30* 30*  CREATININE 1.07* 1.11* 1.18* 1.12* 1.17* 1.19*  CALCIUM 8.3*  --  9.4 9.2 9.6 9.2  GFR: Estimated Creatinine Clearance: 55.3 mL/min (by C-G formula based on SCr of 1.19 mg/dL (H)). Liver Function Tests: No results for input(s): AST, ALT, ALKPHOS, BILITOT, PROT, ALBUMIN in the last 168 hours. No results for input(s): LIPASE, AMYLASE in the last 168 hours. No results for input(s): AMMONIA in the last 168 hours. Coagulation Profile: No results for input(s): INR, PROTIME in the last 168 hours. Cardiac Enzymes: No results for input(s): CKTOTAL, CKMB, CKMBINDEX, TROPONINI in the last 168 hours. BNP (last 3 results) No results for input(s): PROBNP in the last 8760 hours. HbA1C: No results for input(s): HGBA1C in the last 72 hours. CBG:  Recent Labs Lab 08/15/16 0717  GLUCAP 99   Lipid Profile: No results for input(s): CHOL, HDL, LDLCALC, TRIG, CHOLHDL, LDLDIRECT in the last 72 hours. Thyroid Function Tests: No results for input(s): TSH, T4TOTAL, FREET4, T3FREE, THYROIDAB in the last 72 hours. Anemia Panel: No results for input(s): VITAMINB12, FOLATE, FERRITIN, TIBC, IRON, RETICCTPCT in the last 72 hours. Urine analysis:    Component Value Date/Time   COLORURINE AMBER (A) 05/31/2013 0237   APPEARANCEUR CLOUDY (A) 05/31/2013 0237   LABSPEC 1.027 05/31/2013 0237   PHURINE 5.5 05/31/2013 0237   GLUCOSEU NEGATIVE 05/31/2013 0237   HGBUR  NEGATIVE 05/31/2013 0237   BILIRUBINUR SMALL (A) 05/31/2013 0237   KETONESUR 15 (A) 05/31/2013 0237   PROTEINUR NEGATIVE 05/31/2013 0237   UROBILINOGEN 0.2 05/31/2013 0237   NITRITE NEGATIVE 05/31/2013 0237   LEUKOCYTESUR NEGATIVE 05/31/2013 0237     Rosmery Duggin M.D. [facility] 08/17/2016, 12:18 PM  Pager: [phone] Between 7am to 7pm - call Pager - [phone]  After 7pm go to [email or web address] - password [email or web address]  Call night coverage person covering after 7pm

## 2016-08-17 NOTE — Progress Notes (Signed)
RN explained to patient and patient's husband (present at bedside) that when this RN has a patient who cannot turn themselves RN will come in every two hours and assist patient with repositioning to help prevent further skin breakdown.  Patient stated understanding informed RN that she did not want to be woken up every two hours through the night.  Patient instructed RN that RN could check on patient but if she was sleeping to let her sleep.

## 2016-08-17 NOTE — Clinical Social Work Note (Signed)
Clinical Social Worker continuing to follow patient and family for support and discharge planning needs.  Patient and patient husband agreeable to placement at [facility] and understand potential discharge for tomorrow.  Patient is hopeful to ride in the car at discharge.  CSW contacted facility who is agreeable with patient admission, however does request an early discharge.  CSW remains available for support and to facilitate patient discharge needs.  Barbette Or, [place]

## 2016-08-18 LAB — BASIC METABOLIC PANEL
Anion gap: 12 (ref 5–15)
BUN: 29 mg/dL — AB (ref 6–20)
CO2: 31 mmol/L (ref 22–32)
CREATININE: 1.1 mg/dL — AB (ref 0.44–1.00)
Calcium: 9.1 mg/dL (ref 8.9–10.3)
Chloride: 94 mmol/L — ABNORMAL LOW (ref 101–111)
GFR calc Af Amer: >60 mL/min (ref >60)
GFR, EST NON AFRICAN AMERICAN: 57 mL/min — AB (ref >60)
Glucose, Bld: 172 mg/dL — ABNORMAL HIGH (ref 65–99)
Potassium: 4.4 mmol/L (ref 3.5–5.1)
SODIUM: 137 mmol/L (ref 135–145)

## 2016-08-18 LAB — C-REACTIVE PROTEIN: CRP: 2.1 mg/dL — ABNORMAL HIGH (ref <1.0)

## 2016-08-18 LAB — 10-HYDROXYCARBAZEPINE: TRILIPTAL/MTB(OXCARBAZEPIN): 11 ug/mL (ref 10–35)

## 2016-08-18 LAB — SEDIMENTATION RATE: SED RATE: 45 mm/h — AB (ref 0–22)

## 2016-08-18 NOTE — Progress Notes (Signed)
[facility]                                                                              Patient Demographics  Vanessa Short, is a 51 y.o. female, DOB - 10-Nov-1965, YTW:[id number]  Admit date - 08/04/2016   Admitting Physician Edwin Dada, MD  Outpatient Primary MD for the patient is [place]  Outpatient specialists:   LOS - 13  days    Chief Complaint  Patient presents with  . Nasal Congestion  . Fatigue       Brief summary   51 y.o.femalewith a past medical history significant for multiple sclerosis with quadruplegiawho presented with 2 days cough as well as confusion and slurred speech, Workup significant for CAP, Transferred to stepdown on 12/25 giving respiratory distress and worsening hypoxia, Chest x-ray with worsening bilateral infiltrate, worsening pneumonia versus volume overload, 2-D echo with diastolic dysfunction,respiratory status improved with IV diuresis, respiratory panel significant for metapneumovirus. Patient was seen by physical therapy. It was felt that she would benefit from short-term rehabilitation.    Assessment & Plan    Acute encephalopathy - Unclear etiology, although at the time of my examination, patient kept her eyes closed but was listening to everything and followed all the commands. She had received pain medication earlier and was comfortable. - CT head 1/2 showed extensive demyelination with superimposed small vessel disease no acute infarct hemorrhage or edema. - EEG was nonspecific but showed no seizures - Neurology consulted. MRI brain showed numerous hyperacute demyelination, severe chronic demyelination, MRI cervical spine showed C5 mild low malacia, additional spinal cord proximal consistent with chronic demyelination stenosis.  - Started on IV steroids by neurology for possible MS flare - MRA brain normal. - PT evaluation recommending skilled nursing facility - Per  psychiatry, severe major depression, recommended Trileptal, Prozac, Lamictal   Community-acquired Pneumonia - Chest x-ray was significant for right middle lobe, left lower lobe pneumonia, Treated for total of 7 days of IV Rocephin and azithromycin . - urine strep antigennegative, Respiratory panel positive for metapneumovirus virus - RT for chest physiotherapy, IS, flutter valve given very poor cough mechanics - Pro calcitonin normalized,afebrile, no leukocytosis. Chest x-ray repeated 12/30 continues to show a left lower lobe opacity. Chest x-ray 1/2 showed partial clearing of left lower lobe pneumonia   Acute hypoxic respiratory failure - Secondary to pneumonia , chest x-ray with worsening pneumonia since admission, respiratory failure most likely due to pneumonia, and poor cough mechanics, pulmonary input appreciated -  continue chest PT and nebulizers, supplemental O2 as needed  Acute diastolic CHF/hypokalemia - 2-D echo with preserved EF but with grade 2 diastolic dysfunction, -  respiratory status improving with diuresis. Chest x-ray does not show any evidence of fluid overload.  - Continue oral diuretics.  History of MS with quadriplegia - Continue Aubagio, dantrolene and baclofen - Continue amitriptyline and lamotrigine and Trileptal for dysesthesias - Continue Vesicare  Anemia: - Chronic, unchanged.  Slurred speech - Suspect a metabolic encephalopathy from pneumonia in setting of MS.  - MRI brain with abnormal findings in basal ganglia, ischemic versus active demyelination. Neurology was  consulted. Concern for stroke versus tiny demyelinating plaque, stroke diagnosis is not certain.  - Started on aspirin, Lipitor, 2-D echo with no embolic source, carotid Doppler with no significant ICA stenosis, PT/OT .  Ulcerative colitis - Continue mesalamine.   Major Depression - Psychiatry consulted, recommended continue Trileptal, Prozac, Lamictal  Essential  Hypertension - Continue labetalol  Other medications: - Continue acyclovir  Diarrhea - C. difficile negative. Diarrhea has resolved. Hasn't had a BM in few days. Initiated stool softener.  Code Status: Full code DVT Prophylaxis:  Lovenox  Family Communication: Discussed in detail with the patient, all imaging results, lab results explained to the patient. Discussed with patient's husband at the bedside   Disposition Plan: Awaiting neurology recommendations, hopefully DC on Monday or once cleared by neurology  Time Spent in minutes   25 minutes  Procedures:  MRI brain , C-spine   EEG  Consultants:   Neurology   psychiatry  Antimicrobials:   Zithromax completed  Rocephin completed    Medications  Scheduled Meds: . acyclovir  400 mg Oral Daily  . amitriptyline  75 mg Oral QHS  . aspirin  81 mg Oral Daily  . atorvastatin  20 mg Oral q1800  . baclofen  10 mg Oral BID  . baclofen  15 mg Oral QHS  . bisoprolol  5 mg Oral Daily  . dantrolene  50 mg Oral TID AC & HS  . darifenacin  15 mg Oral Daily  . docusate sodium  100 mg Oral BID  . enoxaparin (LOVENOX) injection  40 mg Subcutaneous Daily  . feeding supplement (ENSURE ENLIVE)  237 mL Oral BID BM  . FLUoxetine  40 mg Oral Daily  . furosemide  40 mg Oral BID  . Gerhardt's butt cream   Topical BID  . guaiFENesin  1,200 mg Oral BID  . lamoTRIgine  200 mg Oral TID  . LORazepam  1 mg Oral Once  . mesalamine  1,000 mg Oral TID  . methylPREDNISolone (SOLU-MEDROL) injection  500 mg Intravenous Q12H  . OXcarbazepine  150 mg Oral TID  . pantoprazole  40 mg Oral BID AC  . polyethylene glycol  17 g Oral Daily  . Teriflunomide  1 tablet Oral Daily   Continuous Infusions: PRN Meds:.acetaminophen **OR** acetaminophen, diphenoxylate-atropine, LORazepam, ondansetron **OR** ondansetron (ZOFRAN) IV, oxyCODONE-acetaminophen, sodium chloride   Antibiotics   Anti-infectives    Start     Dose/Rate Route Frequency Ordered  Stop   08/06/16 2200  cefTRIAXone (ROCEPHIN) 1 g in dextrose 5 % 50 mL IVPB  Status:  Discontinued     1 g 100 mL/hr over 30 Minutes Intravenous Every 24 hours 08/05/16 0251 08/10/16 1156   08/06/16 2200  azithromycin (ZITHROMAX) tablet 500 mg  Status:  Discontinued     500 mg Oral Every 24 hours 08/05/16 0251 08/10/16 1156   08/05/16 1000  acyclovir (ZOVIRAX) tablet 400 mg    Comments:  Take every day per patient     400 mg Oral Daily 08/05/16 0251     08/04/16 2300  cefTRIAXone (ROCEPHIN) 1 g in dextrose 5 % 50 mL IVPB     1 g 100 mL/hr over 30 Minutes Intravenous  Once 08/04/16 2252 08/05/16 0019   08/04/16 2300  azithromycin (ZITHROMAX) 500 mg in dextrose 5 % 250 mL IVPB     500 mg 250 mL/hr over 60 Minutes Intravenous  Once 08/04/16 2252 08/05/16 0128        Subjective:   Rhina Sanzo  was seen and examined today. Today patient not responding to any verbal commands. Per husband at the bedside had received Ativan at night for the MRI. Unable to obtain any review of systems.  Objective:   Vitals:   08/17/16 2101 08/17/16 2105 08/17/16 2110 08/18/16 0646  BP:  116/68    Pulse:  81    Resp:  13    Temp: 98.1 F (36.7 C)     TempSrc: Oral     SpO2:   98%   Weight:    79.2 kg (174 lb 8 oz)  Height:        Intake/Output Summary (Last 24 hours) at 08/18/16 1148 Last data filed at 08/18/16 1030  Gross per 24 hour  Intake             1176 ml  Output                0 ml  Net             1176 ml     Wt Readings from Last 3 Encounters:  08/18/16 79.2 kg (174 lb 8 oz)  05/09/16 79.4 kg (175 lb)  11/09/15 79.4 kg (175 lb)     Exam  General: Not responding to any verbal commands but vital signs are stable  HEENT:    Neck:   Cardiovascular: S1 S2 auscultated, no rubs, murmurs or gallops. Regular rate and rhythm.  Respiratory: Clear to auscultation bilaterally, no wheezing, rales or rhonchi  Gastrointestinal: Soft, nontender, nondistended, + bowel sounds  Ext:  no cyanosis clubbing or edema  Neuro:  difficult to assess neurological examination   Skin: No rashes  Psych: not responding to any verbal commands   Data Reviewed:  I have personally reviewed following labs and imaging studies  Micro Results Recent Results (from the past 240 hour(s))  Respiratory Panel by PCR     Status: Abnormal   Collection Time: 08/08/16  3:12 PM  Result Value Ref Range Status   Adenovirus NOT DETECTED NOT DETECTED Final   Coronavirus 229E NOT DETECTED NOT DETECTED Final   Coronavirus HKU1 NOT DETECTED NOT DETECTED Final   Coronavirus NL63 NOT DETECTED NOT DETECTED Final   Coronavirus OC43 NOT DETECTED NOT DETECTED Final   Metapneumovirus DETECTED (A) NOT DETECTED Final   Rhinovirus / Enterovirus NOT DETECTED NOT DETECTED Final   Influenza A NOT DETECTED NOT DETECTED Final   Influenza B NOT DETECTED NOT DETECTED Final   Parainfluenza Virus 1 NOT DETECTED NOT DETECTED Final   Parainfluenza Virus 2 NOT DETECTED NOT DETECTED Final   Parainfluenza Virus 3 NOT DETECTED NOT DETECTED Final   Parainfluenza Virus 4 NOT DETECTED NOT DETECTED Final   Respiratory Syncytial Virus NOT DETECTED NOT DETECTED Final   Bordetella pertussis NOT DETECTED NOT DETECTED Final   Chlamydophila pneumoniae NOT DETECTED NOT DETECTED Final   Mycoplasma pneumoniae NOT DETECTED NOT DETECTED Final  MRSA PCR Screening     Status: None   Collection Time: 08/09/16 11:48 AM  Result Value Ref Range Status   MRSA by PCR NEGATIVE NEGATIVE Final    Comment:        The GeneXpert MRSA Assay (FDA approved for NASAL specimens only), is one component of a comprehensive MRSA colonization surveillance program. It is not intended to diagnose MRSA infection nor to guide or monitor treatment for MRSA infections.   C difficile quick scan w PCR reflex     Status: None   Collection Time: 08/09/16  2:28 PM  Result Value Ref Range Status   C Diff antigen NEGATIVE NEGATIVE Final   C Diff toxin  NEGATIVE NEGATIVE Final   C Diff interpretation No C. difficile detected.  Final    Radiology Reports Dg Chest 2 View  Result Date: 08/04/2016 CLINICAL DATA:  Hypoxia with cough EXAM: CHEST  2 VIEW COMPARISON:  05/30/2013 FINDINGS: Right lung is grossly clear. Airspace opacity is present within the left lower lobe and lingula consistent with pneumonia. Small left pleural effusion. Stable mild cardiomegaly. There is mild central vascular congestion. No pneumothorax. Scoliosis of the lower spine. IMPRESSION: 1. Moderate left mid and lower lung infiltrates with small left pleural effusion. 2. Mild cardiomegaly with central vascular congestion Electronically Signed   By: Donavan Foil M.D.   On: 08/04/2016 22:29   Ct Head Wo Contrast  Result Date: 08/15/2016 CLINICAL DATA:  Altered mental status. History of multiple sclerosis EXAM: CT HEAD WITHOUT CONTRAST TECHNIQUE: Contiguous axial images were obtained from the base of the skull through the vertex without intravenous contrast. COMPARISON:  Head CT August 04, 2016 and brain MRI August 05, 2016 FINDINGS: Brain: Mild diffuse atrophy is stable. There is an apparent arachnoid cyst with remodeling in the anterior left temporal region measuring 3.0 x 1.3 cm, stable. There is no new mass, hemorrhage, extra-axial fluid collection, or midline shift. Extensive decreased attenuation throughout the periventricular white matter remains stable. No new lesion is evident in gray-white compartment regions. Vascular: There is no hyperdense vessel. There is no appreciable vascular calcification. Skull: The bony calvarium appears intact. Sinuses/Orbits: There are multiple areas of opacification in ethmoid air cells bilaterally. There is a small air-fluid level in the right sphenoid region. Other paranasal sinuses are clear. Orbits appear symmetric bilaterally. Other: Mastoid air cells are clear. IMPRESSION: Widespread decreased attenuation in the supratentorial white  matter, likely due to extensive demyelination. There may be a degree of superimposed small vessel disease. At over acute appearing infarct is not evident. No hemorrhage or edema seen. 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 No new mass. There is multifocal paranasal sinus disease with air-fluid level in the right sphenoid sinus. With respect to assessment for potential active foci of demyelinating plaque, MR pre and post-contrast would be the imaging study of choice to further assess. Electronically Signed   By: Lowella Grip III M.D.   On: 08/15/2016 12:29   Ct Head Wo Contrast  Result Date: 08/04/2016 CLINICAL DATA:  Slurred speech and lightheadedness overnight. EXAM: CT HEAD WITHOUT CONTRAST TECHNIQUE: Contiguous axial images were obtained from the base of the skull through the vertex without intravenous contrast. COMPARISON:  01/24/2015 FINDINGS: Brain: There is no intracranial hemorrhage, mass or evidence of acute infarction. There is moderate generalized atrophy. There is moderate chronic microvascular ischemic change. There is no significant extra-axial fluid collection. No acute intracranial findings are evident. Vascular: No hyperdense vessel or unexpected calcification. Skull: Normal. Negative for fracture or focal lesion. Sinuses/Orbits: No acute finding. Other: None. IMPRESSION: No acute intracranial findings. There is moderate generalized atrophy and chronic appearing white matter hypodensities which likely represent small vessel ischemic disease. Electronically Signed   By: Andreas Newport M.D.   On: 08/04/2016 18:22   Mr Jodene Nam Head Wo Contrast  Result Date: 08/17/2016 CLINICAL DATA:  Initial evaluation for acute altered mental status, history of multiple sclerosis, apparent active demyelination on recent brain MRI. EXAM: MRA HEAD WITHOUT CONTRAST MRI HEAD WITH CONTRAST TECHNIQUE: Angiographic images of the Circle  of Willis were obtained using MRA technique with  intravenous contrast. CONTRAST:  61m MULTIHANCE GADOBENATE DIMEGLUMINE 529 MG/ML IV SOLN COMPARISON:  Comparison made with prior MRI from 08/16/2016, as well as previous MRIs from 08/05/2016. FINDINGS: MRI HEAD FINDINGS: Study mildly degraded by motion artifact. Advanced cerebral atrophy for patient age. Multiple white matter lesions compatible with history of multiple sclerosis again seen, stable from recent examinations. No associated enhancement seen on these postcontrast images to suggest active demyelination. No significant mass effect. No findings to suggest PML. Previously noted areas of restricted diffusion seen on recent MRI from 08/16/2016 still remains somewhat concerning for small foci of active demyelination. Acute or subacute ischemia could also be considered. No mass lesion, midline shift, or mass effect. Mild ventricular prominence based on global parenchymal volume loss. No hydrocephalus. No extra-axial fluid collection. Major dural sinuses are patent. Arachnoid cyst within the anterior left middle cranial fossa again noted. Major intracranial vascular flow voids maintained. Craniocervical junction within normal limits. Visualized upper cervical spine grossly unremarkable. Bone marrow signal intensity within normal limits. No scalp soft tissue abnormality. Globes and orbital soft tissues within normal limits. Paranasal sinuses and mastoid air cells are clear. MRA HEAD FINDINGS: ANTERIOR CIRCULATION: Study mildly degraded by motion artifact. Distal cervical segments of the internal carotid arteries are patent with antegrade flow. Petrous, cavernous, and supraclinoid segments widely patent without flow-limiting stenosis. A1 segments patent. Right A1 segment slightly hypoplastic. Anterior communicating artery normal. Anterior cerebral arteries patent to their distal aspects. M1 segments patent without stenosis or occlusion. MCA bifurcations normal. No proximal M2 stenosis or occlusion. Distal MCA  branches well opacified and symmetric. POSTERIOR CIRCULATION: Vertebral arteries widely patent to the vertebrobasilar junction. Basilar artery widely patent. Superior cerebellar arteries patent bilaterally. Both of the posterior cerebral arteries arise from the basilar artery and are well opacified to their distal aspects. No aneurysm or vascular malformation. IMPRESSION: MRI HEAD IMPRESSION: Multiple white matter lesions, compatible with history of multiple sclerosis. No abnormal enhancement on this exam to suggest active demyelination. Previously noted diffusion signal abnormalities may reflect small areas of active demyelination that do not enhance. Acute or subacute ischemia could also be considered, although associated enhancement would be expected in the setting of subacute ischemic disease. MRA HEAD IMPRESSION: Normal intracranial MRA. Negative for large vessel occlusion. No high-grade or correctable stenosis. Electronically Signed   By: BJeannine BogaM.D.   On: 08/17/2016 20:26   Mr Brain Wo Contrast  Result Date: 08/17/2016 CLINICAL DATA:  Altered mental status, history of multiple sclerosis. EXAM: MRI HEAD WITHOUT CONTRAST MRI CERVICAL SPINE WITHOUT CONTRAST TECHNIQUE: Multiplanar, multiecho pulse sequences of the brain and surrounding structures, and cervical spine, to include the craniocervical junction and cervicothoracic junction, were obtained without intravenous contrast. COMPARISON:  MRI head August 05, 2016 and MRI of the cervical spine January 24, 2015 FINDINGS: MRI HEAD FINDINGS- mild to moderately motion degraded examination. BRAIN: Multiple punctate to subcentimeter foci of reduced diffusion largest within RIGHT mesial occipital lobe with rounded morphology, limited assessment on ADC map due to small size with no definite ADC abnormality. Given differences in field strength, these are similar to slightly worse. Numerous white matter ovoid T2 hyperintensities, dominant lesion and LEFT  parietal lobe measuring 16 mm. Lesions demonstrate low T1 signal consistent with black holes of demyelination. Subcentimeter bilateral cerebellar lesions. Confluent white matter FLAIR T2 hyperintensities. Cortical RIGHT thalamic and probable bilateral basal ganglia lesions. Moderate ventriculomegaly on the basis of global parenchymal brain volume loss. No  susceptibility artifact to suggest hemorrhage. No abnormal extra-axial fluid collections. Small LEFT middle cranial fossa arachnoid cyst. VASCULAR: Normal major intracranial vascular flow voids present at skull base. SKULL AND UPPER CERVICAL SPINE: No abnormal sellar expansion. No suspicious calvarial bone marrow signal. Craniocervical junction maintained. SINUSES/ORBITS: The mastoid air-cells and included paranasal sinuses are well-aerated. The included ocular globes and orbital contents are non-suspicious. OTHER: None. MRI CERVICAL SPINE FINDINGS- moderately motion degraded examination. ALIGNMENT: Maintain cervical lordosis.  No malalignment. VERTEBRAE/DISCS: Vertebral bodies are intact. Intervertebral disc morphology's and signal are normal. CORD:17 mm segment of myelomalacia at C5, present on prior imaging. Patchy white matter T2 hyperintense signal upper cervical spinal cord, likely within the included thoracic spinal cord though limited by motion. POSTERIOR FOSSA, VERTEBRAL ARTERIES, PARASPINAL TISSUES: No MR findings of ligamentous injury. Vertebral artery flow voids present. Included posterior fossa and paraspinal soft tissues are normal. DISC LEVELS: Limited assessment due to motion degraded examination. Mid cervical facet arthropathy without canal stenosis at any level. Possible mild RIGHT C3-4 neural foraminal narrowing. IMPRESSION: MRI HEAD: Mild to moderately motion degraded examination. Numerous foci of reduced diffusion most consistent with hyperacute/active demyelination, less likely infection/septic emboli. Severe chronic demyelination including  supra and infratentorial white matter, cortex and deep gray nuclei lesions. Moderate parenchymal brain volume loss for age. MRI CERVICAL SPINE: Moderately motion degraded examination. C5 myelomalacia, additional spinal cord plaques consistent with chronic demyelination, relatively similar though limited assessment due to patient motion. No canal stenosis. Possible mild RIGHT C3-4 neural foraminal narrowing. Electronically Signed   By: Elon Alas M.D.   On: 08/17/2016 00:32   Mr Brain Wo Contrast  Result Date: 08/05/2016 CLINICAL DATA:  Altered mental status EXAM: MRI HEAD WITHOUT CONTRAST TECHNIQUE: Multiplanar, multiecho pulse sequences of the brain and surrounding structures were obtained without intravenous contrast. COMPARISON:  Head CT 08/04/2016 Brain MRI 01/24/2015 FINDINGS: Brain: Punctate focus of high signal on the diffusion-weighted images at the posterior aspect of the right basal ganglia is unchanged compared to 01/24/2015. There is an additional punctate focus of diffusion restriction in the posterior aspect of the left basal ganglia There is severe white matter disease with confluent lesions in the periventricular white matter, oriented perpendicularly to the long axis of the lateral ventricles. There is atrophy of the corpus callosum. There are numerous low T1 weighted signal lesions. No mass lesion or midline shift. No hydrocephalus or extra-axial fluid collection. There is a left middle cranial fossa arachnoid cyst. 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 Vascular: Major intracranial arterial and venous sinus flow voids are preserved. No evidence of chronic microhemorrhage or amyloid angiopathy. Skull and upper cervical spine: The visualized skull base, calvarium, upper cervical spine and extracranial soft tissues are normal. Sinuses/Orbits: No fluid levels or advanced mucosal thickening. No mastoid effusion. Normal orbits. IMPRESSION: 1.  Severe white matter disease in a pattern consistent with multiple sclerosis. Unchanged distribution of T2 hyperintense lesions. 2. Punctate focus of diffusion restriction at the posterior aspect of the left basal ganglia. These may indicate a tiny area of acute ischemia; however, active demyelinating MS plaques may uncommonly show diffusion restriction. Contrast administration might be helpful for differentiation, if necessary. Electronically Signed   By: Ulyses Jarred M.D.   On: 08/05/2016 03:27   Mr Brain W Contrast  Result Date: 08/17/2016 CLINICAL DATA:  Initial evaluation for acute altered mental status, history of multiple sclerosis, apparent active demyelination on recent brain MRI. EXAM: MRA HEAD WITHOUT CONTRAST MRI HEAD  WITH CONTRAST TECHNIQUE: Angiographic images of the Circle of Willis were obtained using MRA technique with intravenous contrast. CONTRAST:  21m MULTIHANCE GADOBENATE DIMEGLUMINE 529 MG/ML IV SOLN COMPARISON:  Comparison made with prior MRI from 08/16/2016, as well as previous MRIs from 08/05/2016. FINDINGS: MRI HEAD FINDINGS: Study mildly degraded by motion artifact. Advanced cerebral atrophy for patient age. Multiple white matter lesions compatible with history of multiple sclerosis again seen, stable from recent examinations. No associated enhancement seen on these postcontrast images to suggest active demyelination. No significant mass effect. No findings to suggest PML. Previously noted areas of restricted diffusion seen on recent MRI from 08/16/2016 still remains somewhat concerning for small foci of active demyelination. Acute or subacute ischemia could also be considered. No mass lesion, midline shift, or mass effect. Mild ventricular prominence based on global parenchymal volume loss. No hydrocephalus. No extra-axial fluid collection. Major dural sinuses are patent. Arachnoid cyst within the anterior left middle cranial fossa again noted. Major intracranial vascular flow voids  maintained. Craniocervical junction within normal limits. Visualized upper cervical spine grossly unremarkable. Bone marrow signal intensity within normal limits. No scalp soft tissue abnormality. Globes and orbital soft tissues within normal limits. Paranasal sinuses and mastoid air cells are clear. MRA HEAD FINDINGS: ANTERIOR CIRCULATION: Study mildly degraded by motion artifact. Distal cervical segments of the internal carotid arteries are patent with antegrade flow. Petrous, cavernous, and supraclinoid segments widely patent without flow-limiting stenosis. A1 segments patent. Right A1 segment slightly hypoplastic. Anterior communicating artery normal. Anterior cerebral arteries patent to their distal aspects. M1 segments patent without stenosis or occlusion. MCA bifurcations normal. No proximal M2 stenosis or occlusion. Distal MCA branches well opacified and symmetric. POSTERIOR CIRCULATION: Vertebral arteries widely patent to the vertebrobasilar junction. Basilar artery widely patent. Superior cerebellar arteries patent bilaterally. Both of the posterior cerebral arteries arise from the basilar artery and are well opacified to their distal aspects. No aneurysm or vascular malformation. IMPRESSION: MRI HEAD IMPRESSION: Multiple white matter lesions, compatible with history of multiple sclerosis. No abnormal enhancement on this exam to suggest active demyelination. Previously noted diffusion signal abnormalities may reflect small areas of active demyelination that do not enhance. Acute or subacute ischemia could also be considered, although associated enhancement would be expected in the setting of subacute ischemic disease. MRA HEAD IMPRESSION: Normal intracranial MRA. Negative for large vessel occlusion. No high-grade or correctable stenosis. Electronically Signed   By: BJeannine BogaM.D.   On: 08/17/2016 20:26   Mr Brain W Contrast  Result Date: 08/05/2016 CLINICAL DATA:  51year old female with  history of multiple sclerosis and quadriplegia presenting with confusion and slurred speech 2 days ago. Cough with patient noted to be hypoxic on room air. Subsequent encounter. EXAM: MRI HEAD WITH CONTRAST TECHNIQUE: Multiplanar, multiecho pulse sequences of the brain and surrounding structures were obtained with intravenous contrast. CONTRAST:  184mMULTIHANCE GADOBENATE DIMEGLUMINE 529 MG/ML IV SOLN COMPARISON:  None. FINDINGS: Brain: Multiple white matter lesions consistent with demyelinating plaques none of which demonstrate enhancement as can be seen with active area of demyelination. No associated mass effect as can be seen with progressive multifocal leukoencephalopathy. Punctate region of restricted motion posterior limb of the right internal capsule may represent a small acute infarct or tiny area of active demyelination. Global atrophy. Anterior left middle cranial fossa arachnoid cyst. Vascular: Major intracranial vascular structures are patent. Skull and upper cervical spine: No acute abnormality. Sinuses/Orbits: No abnormal enhancement of the optic nerves. Other: Partially empty sella incidentally noted. IMPRESSION:  Multiple white matter lesions consistent with demyelinating plaques none of which demonstrate enhancement as can be seen with active area of demyelination. No associated mass effect as can be seen with progressive multifocal leukoencephalopathy. Punctate region of restricted motion posterior limb of the right internal capsule may represent a tiny acute infarct although it is still possible this represents a tiny area of active demyelination which does not enhance. Electronically Signed   By: Genia Del M.D.   On: 08/05/2016 17:46   Mr Cervical Spine Wo Contrast  Result Date: 08/17/2016 CLINICAL DATA:  Altered mental status, history of multiple sclerosis. EXAM: MRI HEAD WITHOUT CONTRAST MRI CERVICAL SPINE WITHOUT CONTRAST TECHNIQUE: Multiplanar, multiecho pulse sequences of the brain  and surrounding structures, and cervical spine, to include the craniocervical junction and cervicothoracic junction, were obtained without intravenous contrast. COMPARISON:  MRI head August 05, 2016 and MRI of the cervical spine January 24, 2015 FINDINGS: MRI HEAD FINDINGS- mild to moderately motion degraded examination. BRAIN: Multiple punctate to subcentimeter foci of reduced diffusion largest within RIGHT mesial occipital lobe with rounded morphology, limited assessment on ADC map due to small size with no definite ADC abnormality. Given differences in field strength, these are similar to slightly worse. Numerous white matter ovoid T2 hyperintensities, dominant lesion and LEFT parietal lobe measuring 16 mm. Lesions demonstrate low T1 signal consistent with black holes of demyelination. Subcentimeter bilateral cerebellar lesions. Confluent white matter FLAIR T2 hyperintensities. Cortical RIGHT thalamic and probable bilateral basal ganglia lesions. Moderate ventriculomegaly on the basis of global parenchymal brain volume loss. No susceptibility artifact to suggest hemorrhage. No abnormal extra-axial fluid collections. Small LEFT middle cranial fossa arachnoid cyst. VASCULAR: Normal major intracranial vascular flow voids present at skull base. SKULL AND UPPER CERVICAL SPINE: No abnormal sellar expansion. No suspicious calvarial bone marrow signal. Craniocervical junction maintained. SINUSES/ORBITS: The mastoid air-cells and included paranasal sinuses are well-aerated. The included ocular globes and orbital contents are non-suspicious. OTHER: None. MRI CERVICAL SPINE FINDINGS- moderately motion degraded examination. ALIGNMENT: Maintain cervical lordosis.  No malalignment. VERTEBRAE/DISCS: Vertebral bodies are intact. Intervertebral disc morphology's and signal are normal. CORD:17 mm segment of myelomalacia at C5, present on prior imaging. Patchy white matter T2 hyperintense signal upper cervical spinal cord, likely  within the included thoracic spinal cord though limited by motion. POSTERIOR FOSSA, VERTEBRAL ARTERIES, PARASPINAL TISSUES: No MR findings of ligamentous injury. Vertebral artery flow voids present. Included posterior fossa and paraspinal soft tissues are normal. DISC LEVELS: Limited assessment due to motion degraded examination. Mid cervical facet arthropathy without canal stenosis at any level. Possible mild RIGHT C3-4 neural foraminal narrowing. IMPRESSION: MRI HEAD: Mild to moderately motion degraded examination. Numerous foci of reduced diffusion most consistent with hyperacute/active demyelination, less likely infection/septic emboli. Severe chronic demyelination including supra and infratentorial white matter, cortex and deep gray nuclei lesions. Moderate parenchymal brain volume loss for age. MRI CERVICAL SPINE: Moderately motion degraded examination. C5 myelomalacia, additional spinal cord plaques consistent with chronic demyelination, relatively similar though limited assessment due to patient motion. No canal stenosis. Possible mild RIGHT C3-4 neural foraminal narrowing. Electronically Signed   By: Elon Alas M.D.   On: 08/17/2016 00:32   Dg Chest Port 1 View  Result Date: 08/15/2016 CLINICAL DATA:  Cough and fever.  Left lower lobe pneumonia. EXAM: PORTABLE CHEST 1 VIEW COMPARISON:  08/12/2016 FINDINGS: There has been slight improvement in the left lower lobe consolidation since the prior study. Right lung remains clear. Heart size and vascularity are normal. No acute  bone abnormality. IMPRESSION: Partial clearing of the left lower lobe pneumonia. Electronically Signed   By: Lorriane Shire M.D.   On: 08/15/2016 10:59   Dg Chest Port 1 View  Result Date: 08/12/2016 CLINICAL DATA:  Chest pain, shortness of Breath EXAM: PORTABLE CHEST 1 VIEW COMPARISON:  08/10/2016 FINDINGS: Cardiomegaly. No confluent opacity on the right. Focal airspace opacity in the left lower lobe. No definite effusion or  acute bony abnormality. IMPRESSION: Cardiomegaly. Left lower lobe opacity.  Cannot exclude pneumonia. Electronically Signed   By: Rolm Baptise M.D.   On: 08/12/2016 12:26   Dg Chest Port 1 View  Result Date: 08/10/2016 CLINICAL DATA:  Bilateral pneumonia, cough, congestion EXAM: PORTABLE CHEST 1 VIEW COMPARISON:  08/09/2016 FINDINGS: There is mild bilateral interstitial thickening with significant interval improvement in the left mid and lower lung airspace opacities. There is no pleural effusion or pneumothorax. The heart mediastinum are stable. The osseous structures are unremarkable. IMPRESSION: 1. There is mild bilateral interstitial thickening with significant interval improvement in the left mid and lower lung airspace opacities. Electronically Signed   By: Kathreen Devoid   On: 08/10/2016 07:56   Dg Chest Port 1 View  Result Date: 08/09/2016 CLINICAL DATA:  Cough and congestion EXAM: PORTABLE CHEST 1 VIEW COMPARISON:  August 08, 2016 FINDINGS: There is persistent airspace consolidation throughout the left mid lower lung zones with small left pleural effusion. The right lung is clear. Heart is mildly enlarged with pulmonary vascularity within normal limits. No adenopathy. No bone lesions. IMPRESSION: Persistent extensive airspace consolidation, likely pneumonia, on the left with small left pleural effusion. Right lung clear. Stable cardiac prominence. Followup PA and lateral chest radiographs recommended in 3-4 weeks following trial of antibiotic therapy to ensure resolution and exclude underlying malignancy. Electronically Signed   By: Lowella Grip III M.D.   On: 08/09/2016 09:05   Dg Chest Port 1 View  Result Date: 08/08/2016 CLINICAL DATA:  Hypoxia EXAM: PORTABLE CHEST 1 VIEW COMPARISON:  August 08, 2016 study obtained earlier in the day FINDINGS: There is airspace consolidation throughout the mid and lower lung zones. There has been partial clearing of infiltrate from the right base.  No new opacity is evident. There is cardiomegaly with pulmonary vascularity within normal limits. No adenopathy. No bone lesions. IMPRESSION: Interval partial clearing from right base. Persistent consolidation left mid and lower lung zones. Stable cardiomegaly. Electronically Signed   By: Lowella Grip III M.D.   On: 08/08/2016 15:53   Dg Chest Port 1 View  Result Date: 08/08/2016 CLINICAL DATA:  51 year old female with a history of shortness of breath EXAM: PORTABLE CHEST 1 VIEW COMPARISON:  08/07/2016, 08/04/2016 FINDINGS: Cardiomediastinal silhouette unchanged. Compared to the most recent chest x-ray there is persisting mixed interstitial and airspace opacity of the bilateral mid and lower lobes. Mild interlobular septal thickening. Obscuration of left hemidiaphragm and retrocardiac region. No pneumothorax. IMPRESSION: Persisting bilateral interstitial and airspace opacity, potentially representing a combination of edema, consolidation, atelectasis, and/or small pleural fluid. Signed, Dulcy Fanny. Earleen Newport, DO [facility] Electronically Signed   By: Corrie Mckusick D.O.   On: 08/08/2016 07:53   Dg Chest Port 1 View  Result Date: 08/07/2016 CLINICAL DATA:  Acute onset of hypoxia.  Initial encounter. EXAM: PORTABLE CHEST 1 VIEW COMPARISON:  Chest radiograph performed earlier today at 8:14 a.m. FINDINGS: Worsening bibasilar and bilateral central airspace opacification raises concern for worsening pneumonia, though pulmonary edema might have a similar appearance. A  small left pleural effusion is noted. No pneumothorax is seen. The cardiomediastinal silhouette is mildly enlarged. No acute osseous abnormalities are seen. IMPRESSION: Worsening bibasilar and bilateral central airspace opacification raises concern for worsening pneumonia, though pulmonary edema might have a similar appearance. Small left pleural effusion noted. Mild cardiomegaly.  Electronically Signed   By: Garald Balding M.D.   On: 08/07/2016 18:23   Dg Chest Port 1 View  Result Date: 08/07/2016 CLINICAL DATA:  Congestion and cough. EXAM: PORTABLE CHEST 1 VIEW COMPARISON:  08/04/2016 FINDINGS: Lungs are adequately inflated with persistent airspace opacification over the left mid to lower lung suggesting pneumonia. No definite effusion. Right lung is clear. Mild stable cardiomegaly. Remainder of the exam is unchanged. IMPRESSION: Persistent airspace process over the left mid to lower lung likely a pneumonia. Electronically Signed   By: Marin Olp M.D.   On: 08/07/2016 09:01    Lab Data:  CBC:  Recent Labs Lab 08/13/16 0726 08/15/16 1651 08/16/16 0631  WBC 7.8 8.0 8.0  HGB 10.5* 10.8* 11.0*  HCT 32.4* 34.2* 35.2*  MCV 97.9 100.3* 100.9*  PLT 200 218 132   Basic Metabolic Panel:  Recent Labs Lab 08/13/16 0726 08/15/16 1651 08/16/16 0631 08/17/16 0314 08/18/16 0536  NA 138 140 140 139 137  K 3.0* 3.8 4.0 4.1 4.4  CL 94* 98* 95* 96* 94*  CO2 35* 28 33* 33* 31  GLUCOSE 114* 101* 111* 116* 172*  BUN 23* 23* 30* 30* 29*  CREATININE 1.18* 1.12* 1.17* 1.19* 1.10*  CALCIUM 9.4 9.2 9.6 9.2 9.1   GFR: Estimated Creatinine Clearance: 60.3 mL/min (by C-G formula based on SCr of 1.1 mg/dL (H)). Liver Function Tests: No results for input(s): AST, ALT, ALKPHOS, BILITOT, PROT, ALBUMIN in the last 168 hours. No results for input(s): LIPASE, AMYLASE in the last 168 hours. No results for input(s): AMMONIA in the last 168 hours. Coagulation Profile: No results for input(s): INR, PROTIME in the last 168 hours. Cardiac Enzymes: No results for input(s): CKTOTAL, CKMB, CKMBINDEX, TROPONINI in the last 168 hours. BNP (last 3 results) No results for input(s): PROBNP in the last 8760 hours. HbA1C: No results for input(s): HGBA1C in the last 72 hours. CBG:  Recent Labs Lab 08/15/16 0717  GLUCAP 99   Lipid Profile: No results for input(s): CHOL, HDL,  LDLCALC, TRIG, CHOLHDL, LDLDIRECT in the last 72 hours. Thyroid Function Tests: No results for input(s): TSH, T4TOTAL, FREET4, T3FREE, THYROIDAB in the last 72 hours. Anemia Panel: No results for input(s): VITAMINB12, FOLATE, FERRITIN, TIBC, IRON, RETICCTPCT in the last 72 hours. Urine analysis:    Component Value Date/Time   COLORURINE AMBER (A) 05/31/2013 0237   APPEARANCEUR CLOUDY (A) 05/31/2013 0237   LABSPEC 1.027 05/31/2013 0237   PHURINE 5.5 05/31/2013 0237   GLUCOSEU NEGATIVE 05/31/2013 0237   HGBUR NEGATIVE 05/31/2013 0237   BILIRUBINUR SMALL (A) 05/31/2013 0237   KETONESUR 15 (A) 05/31/2013 0237   PROTEINUR NEGATIVE 05/31/2013 0237   UROBILINOGEN 0.2 05/31/2013 0237   NITRITE NEGATIVE 05/31/2013 0237   LEUKOCYTESUR NEGATIVE 05/31/2013 0237     RAI,RIPUDEEP M.D. [facility] 08/18/2016, 11:48 AM  Pager: [phone] Between 7am to 7pm - call Pager - [phone]  After 7pm go to [email or web address] - password [email or web address]  Call night coverage person covering after 7pm

## 2016-08-18 NOTE — Progress Notes (Signed)
Subjective: Patient continues to be awake. No acute changes.   Exam: Vitals:   08/17/16 2105 08/18/16 1416  BP: 116/68 (!) 142/85  Pulse: 81 80  Resp: 13 18  Temp:  98.1 F (36.7 C)    Gen: In bed, NAD MS: Awake, alert, interactive and apporpriate.  CN:2-12 intact. Tends to hold jaw shut Motor: moves no extremities purposefully but will not allow her hand to hit her ead when held above head.  Sensory: winces to pain in all extremities.    Impression:  51 year old female with multiple sclerosis with altered mental status. I did have concern for acue demyelination, however on review of her previous imaging, the areas concerning for restricted diffusion this time were present on previous studies. She has no symptoms referable to any of these lesions.   LTG and OXC levels are within normal range.   Recommendations: 1) d/c solumedrol. 2) no further recommendations at this time, please call with any further questions or concerns. Neurology will sign off.   Roland Rack, MD [facility] [phone]  If 7pm- 7am, please page neurology on call as listed in AMION.  08/18/2016, 3:29 PM

## 2016-08-18 NOTE — Care Management Note (Signed)
Case Management Note  Patient Details  Name: Vanessa Short MRN: [id number] Date of Birth: June 02, 1966  Subjective/Objective:  Pt presented for Respiratory Distress with worsening hypoxia. Hx significant for Multiple Sclerosis. Pt is from home with husband. Neurology is following the patient. Patient with increased pain. IV Steroids initiated and per MD plan to monitor over the weekend and d/c to SNF once stable.                   Action/Plan: CSW is following for SNF placement- possible [facility]. No further needs from CM at this time. Will monitor.   Expected Discharge Date:                  Expected Discharge Plan:  Skilled Nursing Facility  In-House Referral:  Clinical Social Work  Discharge planning Services  CM Consult  Post Acute Care Choice:  NA Choice offered to:  NA  DME Arranged:  N/A DME Agency:  NA  HH Arranged:  NA HH Agency:  NA  Status of Service:  Completed, signed off  If discussed at [place] of Stay Meetings, dates discussed:    Additional Comments:  Bethena Roys, RN 08/18/2016, 12:55 PM

## 2016-08-18 NOTE — Progress Notes (Signed)
Initial Nutrition Assessment  DOCUMENTATION CODES:   Obesity unspecified  INTERVENTION:    Ensure Enlive po BID, each supplement provides 350 kcal and 20 grams of protein  NUTRITION DIAGNOSIS:   Inadequate oral intake related to poor appetite as evidenced by per patient/family report.  GOAL:   Patient will meet greater than or equal to 90% of their needs  MONITOR:   PO intake, Supplement acceptance  REASON FOR ASSESSMENT:   Low Braden    ASSESSMENT:   51 y.o. female with a past medical history significant for multiple sclerosis with quadruplegia who presented with 2 days cough as well as confusion and slurred speech, Workup significant for CAP, metapneumovirus.  Patient reports poor intake since admission. Likes Ensure. Weight stable. Nutrition Focused Physical Exam WNL.  Diet Order:  Diet regular Room service appropriate? Yes; Fluid consistency: Thin  Skin:  Reviewed, no issues  Last BM:  12/29  Height:   Ht Readings from Last 1 Encounters:  08/05/16 5' 3"  (1.6 m)    Weight:   Wt Readings from Last 1 Encounters:  08/18/16 174 lb 8 oz (79.2 kg)    Ideal Body Weight:  52.3 kg  BMI:  Body mass index is 30.91 kg/m.  Estimated Nutritional Needs:   Kcal:  1500-1700  Protein:  80-90 gm  Fluid:  1.8 L  EDUCATION NEEDS:   No education needs identified at this time  Molli Barrows, [place], [place], [place] Pager [phone] After Hours Pager [phone]

## 2016-08-18 NOTE — Clinical Social Work Note (Signed)
Clinical Social Worker continuing to follow patient and family for support and discharge planning needs.  Patient has been started on IV steroids and therefore will not be medically stable until the beginning of next week.  CSW confirmed with [facility] who states that patient will continue to have a bed available once medically ready.  CSW remains available for support and to facilitate patient discharge needs once medically stable.  Barbette Or, [place]

## 2016-08-19 LAB — BASIC METABOLIC PANEL
ANION GAP: 12 (ref 5–15)
BUN: 33 mg/dL — ABNORMAL HIGH (ref 6–20)
CALCIUM: 9.9 mg/dL (ref 8.9–10.3)
CO2: 32 mmol/L (ref 22–32)
Chloride: 96 mmol/L — ABNORMAL LOW (ref 101–111)
Creatinine, Ser: 0.99 mg/dL (ref 0.44–1.00)
GLUCOSE: 104 mg/dL — AB (ref 65–99)
POTASSIUM: 4 mmol/L (ref 3.5–5.1)
Sodium: 140 mmol/L (ref 135–145)

## 2016-08-19 MED ORDER — FUROSEMIDE 40 MG PO TABS: 40.0000 mg | ORAL_TABLET | Freq: Two times a day (BID) | ORAL | Status: DC

## 2016-08-19 MED ORDER — SALINE SPRAY 0.65 % NA SOLN: 1.0000 | NASAL | 0 refills | Status: AC | PRN

## 2016-08-19 MED ORDER — BISOPROLOL FUMARATE 5 MG PO TABS: 5.0000 mg | ORAL_TABLET | Freq: Every day | ORAL | Status: DC

## 2016-08-19 MED ORDER — POLYETHYLENE GLYCOL 3350 17 G PO PACK
17.0000 g | PACK | Freq: Every day | ORAL | 0 refills | Status: DC
Start: 2016-08-19 — End: 2017-02-27

## 2016-08-19 MED ORDER — ASPIRIN 81 MG PO CHEW: 81.0000 mg | CHEWABLE_TABLET | Freq: Every day | ORAL | Status: DC

## 2016-08-19 MED ORDER — ATORVASTATIN CALCIUM 20 MG PO TABS: 20.0000 mg | ORAL_TABLET | Freq: Every day | ORAL | Status: DC

## 2016-08-19 MED ORDER — ENSURE ENLIVE PO LIQD: 237.0000 mL | Freq: Two times a day (BID) | ORAL | 12 refills | Status: DC

## 2016-08-19 MED ORDER — GUAIFENESIN ER 600 MG PO TB12: 1200.0000 mg | ORAL_TABLET | Freq: Two times a day (BID) | ORAL | Status: DC

## 2016-08-19 MED ORDER — BACLOFEN 10 MG PO TABS: 15.0000 mg | ORAL_TABLET | Freq: Every day | ORAL | 0 refills | Status: DC

## 2016-08-19 MED ORDER — DIPHENOXYLATE-ATROPINE 2.5-0.025 MG PO TABS: 2.0000 | ORAL_TABLET | Freq: Four times a day (QID) | ORAL | 0 refills | Status: DC | PRN

## 2016-08-19 MED ORDER — DOCUSATE SODIUM 100 MG PO CAPS: 100.0000 mg | ORAL_CAPSULE | Freq: Two times a day (BID) | ORAL | 0 refills | Status: DC

## 2016-08-19 MED ORDER — BACLOFEN 10 MG PO TABS: 10.0000 mg | ORAL_TABLET | Freq: Two times a day (BID) | ORAL | 0 refills | Status: DC

## 2016-08-19 NOTE — Progress Notes (Signed)
Attempted report twice to [facility].

## 2016-08-19 NOTE — Clinical Social Work Note (Signed)
CSW notified by [facility], facility had another pipe burst, had to shift pt's around. Pt currently does not have private room available, semi private ONLY until Monday when pipes are repaired. CSW will discuss with pt.  CSW will continue to follow.  CSW spoke to pt/spouse, they agree to semi private room @ [facility]. Nurse updated. Education administrator with [employer]. DC Summary/Trnsfr Report faxed to facility. Pt has no other needs at this time.  CSW is signing off.  Rionna Feltes B. Joline Maxcy Clinical Social Work Dept Weekend Social Worker [phone] 11:52 AM

## 2016-08-19 NOTE — Progress Notes (Signed)
Prescriptions for Ativan and Percocet were left at hospital when pt was discharged to SNF. Spoke to nurse caring for patient who stated not to worry and that their pharmacy would be able to provide the patient with the Percocet and Ativan until a NP would be able to write the prescription on Monday. Report given to nurse at [facility].

## 2016-08-19 NOTE — Progress Notes (Signed)
Attempted report again to [facility].

## 2016-08-19 NOTE — Clinical Social Work Placement (Signed)
   CLINICAL SOCIAL WORK PLACEMENT  NOTE  Date:  08/19/2016  Patient Details  Name: Vanessa Short MRN: [id number] Date of Birth: March 24, 1966  Clinical Social Work is seeking post-discharge placement for this patient at the   level of care (*CSW will initial, date and re-position this form in  chart as items are completed):  Yes   Patient/family provided with [facility] Work Department's list of facilities offering this level of care within the geographic area requested by the patient (or if unable, by the patient's family).  Yes   Patient/family informed of their freedom to choose among providers that offer the needed level of care, that participate in Medicare, Medicaid or managed care program needed by the patient, have an available bed and are willing to accept the patient.  Yes   Patient/family informed of [facility]'s ownership interest in [facility] and [facility], as well as of the fact that they are under no obligation to receive care at these facilities.  PASRR submitted to EDS on 08/11/16     PASRR number received on       Existing PASRR number confirmed on       FL2 transmitted to all facilities in geographic area requested by pt/family on 08/11/16     FL2 transmitted to all facilities within larger geographic area on 08/11/16     Patient informed that his/her managed care company has contracts with or will negotiate with certain facilities, including the following:        Yes   Patient/family informed of bed offers received.  Patient chooses bed at  [facility])     Physician recommends and patient chooses bed at      Patient to be transferred to  [facility]) on 08/19/16.  Patient to be transferred to facility by  [facility])     Patient family notified on 08/19/16 of transfer.  Name of family member notified:  Spouse @ bedside     PHYSICIAN Please sign FL2     Additional Comment:     _______________________________________________ Serafina Mitchell, LCSWA 08/19/2016, 2:34 PM

## 2016-08-19 NOTE — Discharge Summary (Signed)
Physician Discharge Summary   Patient ID: Alayiah Fontes MRN: [id number] DOB/AGE: [birth year] 52 y.o.  Admit date: 08/04/2016 Discharge date: 08/19/2016  Primary Care Physician:  [place]  Discharge Diagnoses:    . Left lower lobe pneumonia ([place]) . Multiple sclerosis ([place]) . Spastic quadriparesis ([place]) . Hypokalemia . Normocytic anemia . Slurred speech . Ulcerative colitis ([place]) . Essential hypertension . Depression . MDD (major depressive disorder), recurrent severe, without psychosis ([place])   Consults:  Neurology, Dr. Leonel Ramsay   Psychiatry  Recommendations for Outpatient Follow-up:  1. Please repeat CBC/BMET at next visit   DIET: Heart healthy diet    Allergies:   Allergies  Allergen Reactions  . Penicillins Rash    Has patient had a PCN reaction causing immediate rash, facial/tongue/throat swelling, SOB or lightheadedness with hypotension: NO Has patient had a PCN reaction causing severe rash involving mucus membranes or skin necrosis: NO Has patient had a PCN reaction that required hospitalization NO Has patient had a PCN reaction occurring within the last 10 years:NO If all of the above answers are "NO", then may proceed with Cephalosporin use.     DISCHARGE MEDICATIONS: Current Discharge Medication List    START taking these medications   Details  aspirin 81 MG chewable tablet Chew 1 tablet (81 mg total) by mouth daily.    atorvastatin (LIPITOR) 20 MG tablet Take 1 tablet (20 mg total) by mouth daily at 6 PM.    bisoprolol (ZEBETA) 5 MG tablet Take 1 tablet (5 mg total) by mouth daily.    diphenoxylate-atropine (LOMOTIL) 2.5-0.025 MG tablet Take 2 tablets by mouth 4 (four) times daily as needed for diarrhea or loose stools. Qty: 30 tablet, Refills: 0    docusate sodium (COLACE) 100 MG capsule Take 1 capsule (100 mg total) by mouth 2 (two) times daily. Qty: 10 capsule, Refills: 0    feeding supplement, ENSURE  ENLIVE, (ENSURE ENLIVE) LIQD Take 237 mLs by mouth 2 (two) times daily between meals. Qty: 237 mL, Refills: 12    furosemide (LASIX) 40 MG tablet Take 1 tablet (40 mg total) by mouth 2 (two) times daily. Qty: 30 tablet    guaiFENesin (MUCINEX) 600 MG 12 hr tablet Take 2 tablets (1,200 mg total) by mouth 2 (two) times daily.    LORazepam (ATIVAN) 0.5 MG tablet Take 1 tablet (0.5 mg total) by mouth daily as needed for anxiety. Qty: 20 tablet, Refills: 0    oxyCODONE-acetaminophen (PERCOCET/ROXICET) 5-325 MG tablet Take 1-2 tablets by mouth every 6 (six) hours as needed for moderate pain or severe pain. Qty: 20 tablet, Refills: 0    polyethylene glycol (MIRALAX / GLYCOLAX) packet Take 17 g by mouth daily. Qty: 14 each, Refills: 0    sodium chloride (OCEAN) 0.65 % SOLN nasal spray Place 1 spray into both nostrils as needed for congestion. Refills: 0      CONTINUE these medications which have CHANGED   Details  !! baclofen (LIORESAL) 10 MG tablet Take 1 tablet (10 mg total) by mouth 2 (two) times daily. Qty: 30 each, Refills: 0    !! baclofen (LIORESAL) 10 MG tablet Take 1.5 tablets (15 mg total) by mouth at bedtime. Qty: 30 each, Refills: 0     !! - Potential duplicate medications found. Please discuss with provider.    CONTINUE these medications which have NOT CHANGED   Details  acyclovir (ZOVIRAX) 400 MG tablet Take 400 mg by mouth daily. Take every day per patient Refills: 0  amitriptyline (ELAVIL) 75 MG tablet Take 1 tablet (75 mg total) by mouth at bedtime. Qty: 90 tablet, Refills: 1    b complex vitamins tablet Take by mouth.    cholecalciferol (VITAMIN D) 1000 UNITS tablet Take 1,000 Units by mouth daily.    dantrolene (DANTRIUM) 50 MG capsule TAKE 1 CAPSULE 4 TIMES DAILY Qty: 360 capsule, Refills: 3    FLUoxetine (PROZAC) 40 MG capsule Take 1 capsule (40 mg total) by mouth daily. Qty: 90 capsule, Refills: 3    FOLIC ACID PO Take by mouth.    ibuprofen  (ADVIL,MOTRIN) 200 MG tablet Take 200 mg by mouth 2 (two) times daily.    lamoTRIgine (LAMICTAL) 200 MG tablet Take 1 tablet (200 mg total) by mouth 3 (three) times daily. Qty: 270 tablet, Refills: 3    mesalamine (PENTASA) 500 MG CR capsule Take 1,000 mg by mouth 3 (three) times daily.    omeprazole (PRILOSEC) 40 MG capsule Take 40 mg by mouth 2 (two) times daily.    OXcarbazepine (TRILEPTAL) 150 MG tablet Take 1 tablet (150 mg total) by mouth 3 (three) times daily. Qty: 270 tablet, Refills: 0    solifenacin (VESICARE) 10 MG tablet Take 10 mg by mouth daily.    Teriflunomide (AUBAGIO) 14 MG TABS Take 1 tablet by mouth daily. Qty: 84 tablet, Refills: 4      STOP taking these medications     labetalol (NORMODYNE) 100 MG tablet          Brief H and P: For complete details please refer to admission H and P, but in brief 51 y.o.femalewith a past medical history significant for multiple sclerosis with quadruplegiawho presented with 2 days cough as well as confusion and slurred speech, Workup significant for CAP, Transferred to stepdown on 12/25 giving respiratory distress and worsening hypoxia, Chest x-ray with worsening bilateral infiltrate, worsening pneumonia versus volume overload, 2-D echo with diastolic dysfunction,respiratory status improved with IV diuresis, respiratory panel significant for metapneumovirus. Patient was seen by physical therapy. It was felt that she would benefit from short-term rehabilitation.   Hospital Course:   Acute encephalopathy - Significantly improved, patient back to her baseline status, confirmed by her husband at the bedside, alert and oriented 4 - CT head 1/2 showed extensive demyelination with superimposed small vessel disease no acute infarct hemorrhage or edema. - EEG was nonspecific but showed no seizures - Neurology consulted. MRI brain showed numerous hyperacute demyelination, severe chronic demyelination, MRI cervical spine showed C5  mild low malacia, additional spinal cord proximal consistent with chronic demyelination stenosis.  - Started on IV steroids by neurology for possible MS flare, however there was a concern for possible psychogenic component hence his steroids were discontinued. - MRA brain normal. - PT evaluation recommending skilled nursing facility - Per psychiatry, severe major depression, recommended Trileptal, Prozac, Lamictal   Community-acquired Pneumonia - Chest x-ray was significant for right middle lobe, left lower lobe pneumonia, Treated for total of 7 days of IV Rocephin and azithromycin . - urine strep antigennegative, Respiratory panel positive for metapneumovirus virus - RT for chest physiotherapy, IS, flutter valve given very poor cough mechanics - Pro calcitonin normalized,afebrile, no leukocytosis. Chest x-ray repeated 12/30 continues to show a left lower lobe opacity. Chest x-ray 1/2 showed partial clearing of left lower lobe pneumonia   Acute hypoxic respiratory failure - Secondary to pneumonia , chest x-ray with worsening pneumonia since admission, respiratory failure most likely due to pneumonia, and poor cough mechanics, pulmonary input appreciated -  continue chest PT and nebulizers, supplemental O2 as needed  Acute diastolic CHF/hypokalemia - 2-D echo with preserved EF but with grade 2 diastolic dysfunction, -  respiratory status improving with diuresis. Chest x-ray does not show any evidence of fluid overload.  - Continue oral diuretics.  History of MS with quadriplegia - Continue Aubagio, dantrolene and baclofen - Continue amitriptyline and lamotrigine and Trileptal for dysesthesias - Continue Vesicare  Anemia: - Chronic, unchanged.  Slurred speech - Suspect a metabolic encephalopathy from pneumonia in setting of MS.  - MRI brain with abnormal findings in basal ganglia, ischemic versus active demyelination. Neurology was consulted. Concern for stroke versus tiny  demyelinating plaque, stroke diagnosis is not certain.  - Started on aspirin, Lipitor, 2-D echo with no embolic source, carotid Doppler with no significant ICA stenosis, PT/OT .  Ulcerative colitis - Continue mesalamine.   Major Depression - Psychiatry consulted, recommended continue Trileptal, Prozac, Lamictal  Essential Hypertension - Currently stable, on bisoprolol and lasix   Other medications: - Continue acyclovir  Diarrhea - C. difficile negative. Diarrhea has resolved. On stool softener.  Day of Discharge BP 113/64   Pulse 83   Temp 98 F (36.7 C) (Oral)   Resp 18   Ht 5' 3"  (1.6 m)   Wt 79.4 kg (175 lb)   SpO2 92%   BMI 31.00 kg/m   Physical Exam: General: Alert and awake oriented x3 not in any acute distress. HEENT: anicteric sclera, pupils reactive to light and accommodation CVS: S1-S2 clear no murmur rubs or gallops Chest: clear to auscultation bilaterally, no wheezing rales or rhonchi Abdomen: soft nontender, nondistended, normal bowel sounds Extremities: no cyanosis, clubbing or edema noted bilaterally    The results of significant diagnostics from this hospitalization (including imaging, microbiology, ancillary and laboratory) are listed below for reference.    LAB RESULTS: Basic Metabolic Panel:  Recent Labs Lab 08/18/16 0536 08/19/16 0557  NA 137 140  K 4.4 4.0  CL 94* 96*  CO2 31 32  GLUCOSE 172* 104*  BUN 29* 33*  CREATININE 1.10* 0.99  CALCIUM 9.1 9.9   Liver Function Tests: No results for input(s): AST, ALT, ALKPHOS, BILITOT, PROT, ALBUMIN in the last 168 hours. No results for input(s): LIPASE, AMYLASE in the last 168 hours. No results for input(s): AMMONIA in the last 168 hours. CBC:  Recent Labs Lab 08/15/16 1651 08/16/16 0631  WBC 8.0 8.0  HGB 10.8* 11.0*  HCT 34.2* 35.2*  MCV 100.3* 100.9*  PLT 218 226   Cardiac Enzymes: No results for input(s): CKTOTAL, CKMB, CKMBINDEX, TROPONINI in the last 168  hours. BNP: Invalid input(s): POCBNP CBG:  Recent Labs Lab 08/15/16 0717  GLUCAP 99    Significant Diagnostic Studies:  Dg Chest 2 View  Result Date: 08/04/2016 CLINICAL DATA:  Hypoxia with cough EXAM: CHEST  2 VIEW COMPARISON:  05/30/2013 FINDINGS: Right lung is grossly clear. Airspace opacity is present within the left lower lobe and lingula consistent with pneumonia. Small left pleural effusion. Stable mild cardiomegaly. There is mild central vascular congestion. No pneumothorax. Scoliosis of the lower spine. IMPRESSION: 1. Moderate left mid and lower lung infiltrates with small left pleural effusion. 2. Mild cardiomegaly with central vascular congestion Electronically Signed   By: Donavan Foil M.D.   On: 08/04/2016 22:29   Ct Head Wo Contrast  Result Date: 08/04/2016 CLINICAL DATA:  Slurred speech and lightheadedness overnight. EXAM: CT HEAD WITHOUT CONTRAST TECHNIQUE: Contiguous axial images were obtained from the base of the  skull through the vertex without intravenous contrast. COMPARISON:  01/24/2015 FINDINGS: Brain: There is no intracranial hemorrhage, mass or evidence of acute infarction. There is moderate generalized atrophy. There is moderate chronic microvascular ischemic change. There is no significant extra-axial fluid collection. No acute intracranial findings are evident. Vascular: No hyperdense vessel or unexpected calcification. Skull: Normal. Negative for fracture or focal lesion. Sinuses/Orbits: No acute finding. Other: None. IMPRESSION: No acute intracranial findings. There is moderate generalized atrophy and chronic appearing white matter hypodensities which likely represent small vessel ischemic disease. Electronically Signed   By: Andreas Newport M.D.   On: 08/04/2016 18:22   Mr Brain Wo Contrast  Result Date: 08/05/2016 CLINICAL DATA:  Altered mental status EXAM: MRI HEAD WITHOUT CONTRAST TECHNIQUE: Multiplanar, multiecho pulse sequences of the brain and  surrounding structures were obtained without intravenous contrast. COMPARISON:  Head CT 08/04/2016 Brain MRI 01/24/2015 FINDINGS: Brain: Punctate focus of high signal on the diffusion-weighted images at the posterior aspect of the right basal ganglia is unchanged compared to 01/24/2015. There is an additional punctate focus of diffusion restriction in the posterior aspect of the left basal ganglia There is severe white matter disease with confluent lesions in the periventricular white matter, oriented perpendicularly to the long axis of the lateral ventricles. There is atrophy of the corpus callosum. There are numerous low T1 weighted signal lesions. No mass lesion or midline shift. No hydrocephalus or extra-axial fluid collection. There is a left middle cranial fossa arachnoid cyst. 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 Vascular: Major intracranial arterial and venous sinus flow voids are preserved. No evidence of chronic microhemorrhage or amyloid angiopathy. Skull and upper cervical spine: The visualized skull base, calvarium, upper cervical spine and extracranial soft tissues are normal. Sinuses/Orbits: No fluid levels or advanced mucosal thickening. No mastoid effusion. Normal orbits. IMPRESSION: 1. Severe white matter disease in a pattern consistent with multiple sclerosis. Unchanged distribution of T2 hyperintense lesions. 2. Punctate focus of diffusion restriction at the posterior aspect of the left basal ganglia. These may indicate a tiny area of acute ischemia; however, active demyelinating MS plaques may uncommonly show diffusion restriction. Contrast administration might be helpful for differentiation, if necessary. Electronically Signed   By: Ulyses Jarred M.D.   On: 08/05/2016 03:27   Mr Brain W Contrast  Result Date: 08/05/2016 CLINICAL DATA:  51 year old female with history of multiple sclerosis and quadriplegia presenting with confusion and  slurred speech 2 days ago. Cough with patient noted to be hypoxic on room air. Subsequent encounter. EXAM: MRI HEAD WITH CONTRAST TECHNIQUE: Multiplanar, multiecho pulse sequences of the brain and surrounding structures were obtained with intravenous contrast. CONTRAST:  76m MULTIHANCE GADOBENATE DIMEGLUMINE 529 MG/ML IV SOLN COMPARISON:  None. FINDINGS: Brain: Multiple white matter lesions consistent with demyelinating plaques none of which demonstrate enhancement as can be seen with active area of demyelination. No associated mass effect as can be seen with progressive multifocal leukoencephalopathy. Punctate region of restricted motion posterior limb of the right internal capsule may represent a small acute infarct or tiny area of active demyelination. Global atrophy. Anterior left middle cranial fossa arachnoid cyst. Vascular: Major intracranial vascular structures are patent. Skull and upper cervical spine: No acute abnormality. Sinuses/Orbits: No abnormal enhancement of the optic nerves. Other: Partially empty sella incidentally noted. IMPRESSION: Multiple white matter lesions consistent with demyelinating plaques none of which demonstrate enhancement as can be seen with active area of demyelination. No associated mass effect as can be  seen with progressive multifocal leukoencephalopathy. Punctate region of restricted motion posterior limb of the right internal capsule may represent a tiny acute infarct although it is still possible this represents a tiny area of active demyelination which does not enhance. Electronically Signed   By: Genia Del M.D.   On: 08/05/2016 17:46    2D ECHO: Study Conclusions  - Left ventricle: The cavity size was normal. There was moderate   concentric hypertrophy. Systolic function was normal. The   estimated ejection fraction was in the range of 55% to 60%. Wall   motion was normal; there were no regional wall motion   abnormalities. Features are consistent with a  pseudonormal left   ventricular filling pattern, with concomitant abnormal relaxation   and increased filling pressure (grade 2 diastolic dysfunction).   Doppler parameters are consistent with indeterminate ventricular   filling pressure. - Aortic valve: Transvalvular velocity was within the normal range.   There was no stenosis. There was no regurgitation. - Mitral valve: Transvalvular velocity was within the normal range.   There was no evidence for stenosis. There was trivial   regurgitation. - Left atrium: The atrium was mildly dilated. - Right ventricle: The cavity size was normal. Wall thickness was   normal. Systolic function was normal. - Atrial septum: No defect or patent foramen ovale was identified   by color flow Doppler. - Tricuspid valve: There was trivial regurgitation. - Pulmonary arteries: Systolic pressure was within the normal   range. PA peak pressure: 31 mm Hg (S).  Disposition and Follow-up:    DISPOSITION:  SNF   DISCHARGE FOLLOW-UP Follow-up Information    SATER,RICHARD A, MD. Schedule an appointment as soon as possible for a visit in 2 week(s).   Specialty:  Neurology Contact information: [place] [address] [phone]        [place]. Schedule an appointment as soon as possible for a visit in 2 week(s).   Specialty:  Family Medicine Contact information: [address] [place] [address] [phone]            Time spent on Discharge: 50mns   Signed:   RAI,RIPUDEEP M.D. [facility] 08/19/2016, 11:48 AM Pager: [phone]

## 2016-08-19 NOTE — Progress Notes (Signed)
Clarified with pharmacy that pt does not have any medication being stored in the pharmacy.

## 2016-08-21 ENCOUNTER — Encounter: Payer: Self-pay | Admitting: Adult Health

## 2016-08-21 ENCOUNTER — Other Ambulatory Visit: Payer: Self-pay | Admitting: *Deleted

## 2016-08-21 ENCOUNTER — Non-Acute Institutional Stay (SKILLED_NURSING_FACILITY): Payer: Commercial Managed Care - PPO | Admitting: Adult Health

## 2016-08-21 DIAGNOSIS — I10 Essential (primary) hypertension: Secondary | ICD-10-CM | POA: Diagnosis not present

## 2016-08-21 DIAGNOSIS — K5190 Ulcerative colitis, unspecified, without complications: Secondary | ICD-10-CM

## 2016-08-21 DIAGNOSIS — G35 Multiple sclerosis: Secondary | ICD-10-CM

## 2016-08-21 DIAGNOSIS — D638 Anemia in other chronic diseases classified elsewhere: Secondary | ICD-10-CM | POA: Diagnosis not present

## 2016-08-21 DIAGNOSIS — N3281 Overactive bladder: Secondary | ICD-10-CM

## 2016-08-21 DIAGNOSIS — K5901 Slow transit constipation: Secondary | ICD-10-CM

## 2016-08-21 DIAGNOSIS — J9601 Acute respiratory failure with hypoxia: Secondary | ICD-10-CM | POA: Diagnosis not present

## 2016-08-21 DIAGNOSIS — J189 Pneumonia, unspecified organism: Secondary | ICD-10-CM | POA: Diagnosis not present

## 2016-08-21 DIAGNOSIS — E785 Hyperlipidemia, unspecified: Secondary | ICD-10-CM | POA: Diagnosis not present

## 2016-08-21 DIAGNOSIS — K219 Gastro-esophageal reflux disease without esophagitis: Secondary | ICD-10-CM

## 2016-08-21 DIAGNOSIS — F332 Major depressive disorder, recurrent severe without psychotic features: Secondary | ICD-10-CM | POA: Diagnosis not present

## 2016-08-21 DIAGNOSIS — I5031 Acute diastolic (congestive) heart failure: Secondary | ICD-10-CM | POA: Diagnosis not present

## 2016-08-21 DIAGNOSIS — G9340 Encephalopathy, unspecified: Secondary | ICD-10-CM | POA: Diagnosis not present

## 2016-08-21 DIAGNOSIS — F419 Anxiety disorder, unspecified: Secondary | ICD-10-CM

## 2016-08-21 LAB — ANCA TITERS: C-ANCA: <1:20 {titer}

## 2016-08-21 MED ORDER — LORAZEPAM 0.5 MG PO TABS: 0.5000 mg | ORAL_TABLET | Freq: Every day | ORAL | 0 refills | Status: DC | PRN

## 2016-08-21 MED ORDER — DIPHENOXYLATE-ATROPINE 2.5-0.025 MG PO TABS: 2.0000 | ORAL_TABLET | Freq: Four times a day (QID) | ORAL | 0 refills | Status: DC | PRN

## 2016-08-21 MED ORDER — OXYCODONE-ACETAMINOPHEN 5-325 MG PO TABS: 1.0000 | ORAL_TABLET | Freq: Four times a day (QID) | ORAL | 0 refills | Status: DC | PRN

## 2016-08-21 NOTE — Progress Notes (Signed)
DATE:  08/21/2016   MRN:  [id number]  BIRTHDAY: 1966/02/03  Facility:  Nursing Home Location:  [place] Room Number: 1103-A  LEVEL OF CARE:  SNF [phone]  Contact Information    Name Relation Home Work Vanessa Short Spouse  [phone]       Code Status History    Date Active Date Inactive Code Status Order ID Comments User Context   08/05/2016  2:51 AM 08/19/2016  7:50 PM Full Code [id number]  Edwin Dada, MD ED       Chief Complaint  Patient presents with  . Hospitalization Follow-up    HISTORY OF PRESENT ILLNESS:  This is a 51-YO female who has been admitted to [facility] on 08/19/2016 for short-term rehabilitation following an admission at [facility] from 08/04/16-08/19/16 for cough, confusion, and slurred speech.  Workup was significant for CAP. She was transferred to stepdown on 12/25 due to respiratory distress and worsening hypoxia. Chest x-ray showed worsening bilateral infiltrates versus volume overload. Respiratory status improved with IV diuresis and respiratory panel significant for metapneumovirus. She was treated for a total of 7 days IV Rocephin and azithromycin.  She was seen in the room with husband and caregiver at bedside. Reviewed medications with patient and adjustments done for home medications.   PAST MEDICAL HISTORY:  Past Medical History:  Diagnosis Date  . Headache   . Hypertension   . Kidney stones   . Movement disorder   . Multiple sclerosis ([place])   . Neuropathy ([place])   . Ulcerative colitis ([place])   . Vision abnormalities      CURRENT MEDICATIONS: Reviewed  Patient's Medications  New Prescriptions   No medications on file  Previous Medications   ACYCLOVIR (ZOVIRAX) 400 MG TABLET    Take 400 mg by mouth daily. Take every day per patient   AMITRIPTYLINE (ELAVIL) 75 MG TABLET    Take 1 tablet (75 mg total) by mouth at bedtime.   ASPIRIN 81 MG CHEWABLE TABLET     Chew 1 tablet (81 mg total) by mouth daily.   ATORVASTATIN (LIPITOR) 20 MG TABLET    Take 1 tablet (20 mg total) by mouth daily at 6 PM.   B COMPLEX VITAMINS TABLET    Take 1 tablet by mouth daily.    BACLOFEN (LIORESAL) 10 MG TABLET    Take 1 tablet (10 mg total) by mouth 2 (two) times daily.   BACLOFEN (LIORESAL) 10 MG TABLET    Take 1.5 tablets (15 mg total) by mouth at bedtime.   BISOPROLOL (ZEBETA) 5 MG TABLET    Take 1 tablet (5 mg total) by mouth daily.   CHOLECALCIFEROL (VITAMIN D) 1000 UNITS TABLET    Take 1,000 Units by mouth daily.    DANTROLENE (DANTRIUM) 50 MG CAPSULE    TAKE 1 CAPSULE 4 TIMES DAILY   DOCUSATE SODIUM (COLACE) 100 MG CAPSULE    Take 1 capsule (100 mg total) by mouth 2 (two) times daily.   FLUOXETINE (PROZAC) 40 MG CAPSULE    Take 1 capsule (40 mg total) by mouth daily.   FOLIC ACID PO    Take 1 tablet by mouth daily.    FUROSEMIDE (LASIX) 40 MG TABLET    Take 1 tablet (40 mg total) by mouth 2 (two) times daily.   GUAIFENESIN (MUCINEX) 600 MG 12 HR TABLET    Take 2 tablets (1,200 mg total) by mouth 2 (two)  times daily.   IBUPROFEN (ADVIL,MOTRIN) 200 MG TABLET    Take 200 mg by mouth 2 (two) times daily.   LAMOTRIGINE (LAMICTAL) 200 MG TABLET    Take 1 tablet (200 mg total) by mouth 3 (three) times daily.   MESALAMINE (PENTASA) 500 MG CR CAPSULE    Take 1,000 mg by mouth 3 (three) times daily.    NUTRITIONAL SUPPLEMENT LIQD    Take 120 mLs by mouth 2 (two) times daily.   OMEPRAZOLE (PRILOSEC) 40 MG CAPSULE    Take 40 mg by mouth 2 (two) times daily.    OXCARBAZEPINE (TRILEPTAL) 150 MG TABLET    Take 1 tablet (150 mg total) by mouth 3 (three) times daily.   POLYETHYLENE GLYCOL (MIRALAX / GLYCOLAX) PACKET    Take 17 g by mouth daily.   SODIUM CHLORIDE (OCEAN) 0.65 % SOLN NASAL SPRAY    Place 1 spray into both nostrils as needed for congestion.   SOLIFENACIN (VESICARE) 10 MG TABLET    Take 10 mg by mouth daily.    TERIFLUNOMIDE (AUBAGIO) 14 MG TABS    Take 1 tablet by  mouth daily.  Modified Medications   Modified Medication Previous Medication   DIPHENOXYLATE-ATROPINE (LOMOTIL) 2.5-0.025 MG TABLET diphenoxylate-atropine (LOMOTIL) 2.5-0.025 MG tablet      Take 2 tablets by mouth 4 (four) times daily as needed for diarrhea or loose stools.    Take 2 tablets by mouth 4 (four) times daily as needed for diarrhea or loose stools.   LORAZEPAM (ATIVAN) 0.5 MG TABLET LORazepam (ATIVAN) 0.5 MG tablet      Take 1 tablet (0.5 mg total) by mouth daily as needed for anxiety.    Take 1 tablet (0.5 mg total) by mouth daily as needed for anxiety.   OXYCODONE-ACETAMINOPHEN (PERCOCET/ROXICET) 5-325 MG TABLET oxyCODONE-acetaminophen (PERCOCET/ROXICET) 5-325 MG tablet      Take 1-2 tablets by mouth every 6 (six) hours as needed for moderate pain or severe pain. DNE 3gm of APAP/24 hours    Take 1-2 tablets by mouth every 6 (six) hours as needed for moderate pain or severe pain.  Discontinued Medications   FEEDING SUPPLEMENT, ENSURE ENLIVE, (ENSURE ENLIVE) LIQD    Take 237 mLs by mouth 2 (two) times daily between meals.     Allergies  Allergen Reactions  . Penicillins Rash    Has patient had a PCN reaction causing immediate rash, facial/tongue/throat swelling, SOB or lightheadedness with hypotension: NO Has patient had a PCN reaction causing severe rash involving mucus membranes or skin necrosis: NO Has patient had a PCN reaction that required hospitalization NO Has patient had a PCN reaction occurring within the last 10 years:NO If all of the above answers are "NO", then may proceed with Cephalosporin use.     REVIEW OF SYSTEMS:  GENERAL: no change in appetite, no fatigue, no weight changes, no fever, chills or weakness EYES: Denies change in vision, dry eyes, eye pain, itching or discharge EARS: Denies change in hearing, ringing in ears, or earache NOSE: Denies nasal congestion or epistaxis MOUTH and THROAT: Denies oral discomfort, gingival pain or bleeding, pain from  teeth or hoarseness   RESPIRATORY: no cough, SOB, DOE, wheezing, hemoptysis CARDIAC: no chest pain, edema or palpitations GI: no abdominal pain, diarrhea, constipation, heart burn, nausea or vomiting GU: Denies dysuria, frequency, hematuria, incontinence, or discharge NEUROLOGICAL: Denies dizziness, syncope, or headache PSYCHIATRIC: Denies feeling of depression or anxiety. No report of hallucinations, insomnia, paranoia, or agitation    PHYSICAL  EXAMINATION  GENERAL APPEARANCE: Well nourished. In no acute distress. Obese SKIN:  Skin is warm and dry.  HEAD: Normal in size and contour. No evidence of trauma EYES: Lids open and close normally. No blepharitis, entropion or ectropion. PERRL. Conjunctivae are clear and sclerae are white. Lenses are without opacity EARS: Pinnae are normal. Patient hears normal voice tunes of the examiner MOUTH and THROAT: Lips are without lesions. Oral mucosa is moist and without lesions. Tongue is normal in shape, size, and color and without lesions NECK: supple, trachea midline, no neck masses, no thyroid tenderness, no thyromegaly LYMPHATICS: no LAN in the neck, no supraclavicular LAN RESPIRATORY: breathing is even & unlabored, BS CTAB CARDIAC: RRR, no murmur,no extra heart sounds, no edema GI: abdomen soft, normal BS, no masses, no tenderness, no hepatomegaly, no splenomegaly EXTREMITIES:  Quadriplegic NEUROLOGICAL:  Speech is slurred PSYCHIATRIC: Alert and oriented X 3. Affect and behavior are appropriate  LABS/RADIOLOGY: Labs reviewed: Basic Metabolic Panel:  Recent Labs  08/06/16 0416  08/17/16 0314 08/18/16 0536 08/19/16 0557  NA  --   < > 139 137 140  K  --   < > 4.1 4.4 4.0  CL  --   < > 96* 94* 96*  CO2  --   < > 33* 31 32  GLUCOSE  --   < > 116* 172* 104*  BUN  --   < > 30* 29* 33*  CREATININE  --   < > 1.19* 1.10* 0.99  CALCIUM  --   < > 9.2 9.1 9.9  MG 2.2  --   --   --   --   PHOS 2.4*  --   --   --   --   < > = values in this  interval not displayed. Liver Function Tests:  Recent Labs  11/09/15 1351 08/04/16 1731  AST 16 28  ALT 14 24  ALKPHOS 82 67  BILITOT 0.4 0.6  PROT 6.4 6.0*  ALBUMIN 4.0 3.3*   CBC:  Recent Labs  11/09/15 1351 08/04/16 1731  08/13/16 0726 08/15/16 1651 08/16/16 0631  WBC 7.6 12.2*  < > 7.8 8.0 8.0  NEUTROABS 5.3 10.5*  --   --   --   --   HGB  --  10.2*  < > 10.5* 10.8* 11.0*  HCT 32.7* 32.8*  < > 32.4* 34.2* 35.2*  MCV 101* 101.5*  < > 97.9 100.3* 100.9*  PLT 213 147*  < > 200 218 226  < > = values in this interval not displayed.  Lipid Panel:  Recent Labs  08/06/16 0416  HDL 35*    CBG:  Recent Labs  08/15/16 7948  AXKPVV 74    Dg Chest 2 View  Result Date: 08/04/2016 CLINICAL DATA:  Hypoxia with cough EXAM: CHEST  2 VIEW COMPARISON:  05/30/2013 FINDINGS: Right lung is grossly clear. Airspace opacity is present within the left lower lobe and lingula consistent with pneumonia. Small left pleural effusion. Stable mild cardiomegaly. There is mild central vascular congestion. No pneumothorax. Scoliosis of the lower spine. IMPRESSION: 1. Moderate left mid and lower lung infiltrates with small left pleural effusion. 2. Mild cardiomegaly with central vascular congestion Electronically Signed   By: Donavan Foil M.D.   On: 08/04/2016 22:29   Ct Head Wo Contrast  Result Date: 08/15/2016 CLINICAL DATA:  Altered mental status. History of multiple sclerosis EXAM: CT HEAD WITHOUT CONTRAST TECHNIQUE: Contiguous axial images were obtained from the base of the  skull through the vertex without intravenous contrast. COMPARISON:  Head CT August 04, 2016 and brain MRI August 05, 2016 FINDINGS: Brain: Mild diffuse atrophy is stable. There is an apparent arachnoid cyst with remodeling in the anterior left temporal region measuring 3.0 x 1.3 cm, stable. There is no new mass, hemorrhage, extra-axial fluid collection, or midline shift. Extensive decreased attenuation throughout the  periventricular white matter remains stable. No new lesion is evident in gray-white compartment regions. Vascular: There is no hyperdense vessel. There is no appreciable vascular calcification. Skull: The bony calvarium appears intact. Sinuses/Orbits: There are multiple areas of opacification in ethmoid air cells bilaterally. There is a small air-fluid level in the right sphenoid region. Other paranasal sinuses are clear. Orbits appear symmetric bilaterally. Other: Mastoid air cells are clear. IMPRESSION: Widespread decreased attenuation in the supratentorial white matter, likely due to extensive demyelination. There may be a degree of superimposed small vessel disease. At over acute appearing infarct is not evident. No hemorrhage or edema seen. 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 No new mass. There is multifocal paranasal sinus disease with air-fluid level in the right sphenoid sinus. With respect to assessment for potential active foci of demyelinating plaque, MR pre and post-contrast would be the imaging study of choice to further assess. Electronically Signed   By: Lowella Grip III M.D.   On: 08/15/2016 12:29   Ct Head Wo Contrast  Result Date: 08/04/2016 CLINICAL DATA:  Slurred speech and lightheadedness overnight. EXAM: CT HEAD WITHOUT CONTRAST TECHNIQUE: Contiguous axial images were obtained from the base of the skull through the vertex without intravenous contrast. COMPARISON:  01/24/2015 FINDINGS: Brain: There is no intracranial hemorrhage, mass or evidence of acute infarction. There is moderate generalized atrophy. There is moderate chronic microvascular ischemic change. There is no significant extra-axial fluid collection. No acute intracranial findings are evident. Vascular: No hyperdense vessel or unexpected calcification. Skull: Normal. Negative for fracture or focal lesion. Sinuses/Orbits: No acute finding. Other: None. IMPRESSION: No acute intracranial findings.  There is moderate generalized atrophy and chronic appearing white matter hypodensities which likely represent small vessel ischemic disease. Electronically Signed   By: Andreas Newport M.D.   On: 08/04/2016 18:22   Mr Jodene Nam Head Wo Contrast  Result Date: 08/17/2016 CLINICAL DATA:  Initial evaluation for acute altered mental status, history of multiple sclerosis, apparent active demyelination on recent brain MRI. EXAM: MRA HEAD WITHOUT CONTRAST MRI HEAD WITH CONTRAST TECHNIQUE: Angiographic images of the Circle of Willis were obtained using MRA technique with intravenous contrast. CONTRAST:  74m MULTIHANCE GADOBENATE DIMEGLUMINE 529 MG/ML IV SOLN COMPARISON:  Comparison made with prior MRI from 08/16/2016, as well as previous MRIs from 08/05/2016. FINDINGS: MRI HEAD FINDINGS: Study mildly degraded by motion artifact. Advanced cerebral atrophy for patient age. Multiple white matter lesions compatible with history of multiple sclerosis again seen, stable from recent examinations. No associated enhancement seen on these postcontrast images to suggest active demyelination. No significant mass effect. No findings to suggest PML. Previously noted areas of restricted diffusion seen on recent MRI from 08/16/2016 still remains somewhat concerning for small foci of active demyelination. Acute or subacute ischemia could also be considered. No mass lesion, midline shift, or mass effect. Mild ventricular prominence based on global parenchymal volume loss. No hydrocephalus. No extra-axial fluid collection. Major dural sinuses are patent. Arachnoid cyst within the anterior left middle cranial fossa again noted. Major intracranial vascular flow voids maintained. Craniocervical junction within normal limits. Visualized upper cervical spine  grossly unremarkable. Bone marrow signal intensity within normal limits. No scalp soft tissue abnormality. Globes and orbital soft tissues within normal limits. Paranasal sinuses and mastoid  air cells are clear. MRA HEAD FINDINGS: ANTERIOR CIRCULATION: Study mildly degraded by motion artifact. Distal cervical segments of the internal carotid arteries are patent with antegrade flow. Petrous, cavernous, and supraclinoid segments widely patent without flow-limiting stenosis. A1 segments patent. Right A1 segment slightly hypoplastic. Anterior communicating artery normal. Anterior cerebral arteries patent to their distal aspects. M1 segments patent without stenosis or occlusion. MCA bifurcations normal. No proximal M2 stenosis or occlusion. Distal MCA branches well opacified and symmetric. POSTERIOR CIRCULATION: Vertebral arteries widely patent to the vertebrobasilar junction. Basilar artery widely patent. Superior cerebellar arteries patent bilaterally. Both of the posterior cerebral arteries arise from the basilar artery and are well opacified to their distal aspects. No aneurysm or vascular malformation. IMPRESSION: MRI HEAD IMPRESSION: Multiple white matter lesions, compatible with history of multiple sclerosis. No abnormal enhancement on this exam to suggest active demyelination. Previously noted diffusion signal abnormalities may reflect small areas of active demyelination that do not enhance. Acute or subacute ischemia could also be considered, although associated enhancement would be expected in the setting of subacute ischemic disease. MRA HEAD IMPRESSION: Normal intracranial MRA. Negative for large vessel occlusion. No high-grade or correctable stenosis. Electronically Signed   By: Jeannine Boga M.D.   On: 08/17/2016 20:26   Mr Brain Wo Contrast  Result Date: 08/17/2016 CLINICAL DATA:  Altered mental status, history of multiple sclerosis. EXAM: MRI HEAD WITHOUT CONTRAST MRI CERVICAL SPINE WITHOUT CONTRAST TECHNIQUE: Multiplanar, multiecho pulse sequences of the brain and surrounding structures, and cervical spine, to include the craniocervical junction and cervicothoracic junction, were  obtained without intravenous contrast. COMPARISON:  MRI head August 05, 2016 and MRI of the cervical spine January 24, 2015 FINDINGS: MRI HEAD FINDINGS- mild to moderately motion degraded examination. BRAIN: Multiple punctate to subcentimeter foci of reduced diffusion largest within RIGHT mesial occipital lobe with rounded morphology, limited assessment on ADC map due to small size with no definite ADC abnormality. Given differences in field strength, these are similar to slightly worse. Numerous white matter ovoid T2 hyperintensities, dominant lesion and LEFT parietal lobe measuring 16 mm. Lesions demonstrate low T1 signal consistent with black holes of demyelination. Subcentimeter bilateral cerebellar lesions. Confluent white matter FLAIR T2 hyperintensities. Cortical RIGHT thalamic and probable bilateral basal ganglia lesions. Moderate ventriculomegaly on the basis of global parenchymal brain volume loss. No susceptibility artifact to suggest hemorrhage. No abnormal extra-axial fluid collections. Small LEFT middle cranial fossa arachnoid cyst. VASCULAR: Normal major intracranial vascular flow voids present at skull base. SKULL AND UPPER CERVICAL SPINE: No abnormal sellar expansion. No suspicious calvarial bone marrow signal. Craniocervical junction maintained. SINUSES/ORBITS: The mastoid air-cells and included paranasal sinuses are well-aerated. The included ocular globes and orbital contents are non-suspicious. OTHER: None. MRI CERVICAL SPINE FINDINGS- moderately motion degraded examination. ALIGNMENT: Maintain cervical lordosis.  No malalignment. VERTEBRAE/DISCS: Vertebral bodies are intact. Intervertebral disc morphology's and signal are normal. CORD:17 mm segment of myelomalacia at C5, present on prior imaging. Patchy white matter T2 hyperintense signal upper cervical spinal cord, likely within the included thoracic spinal cord though limited by motion. POSTERIOR FOSSA, VERTEBRAL ARTERIES, PARASPINAL TISSUES:  No MR findings of ligamentous injury. Vertebral artery flow voids present. Included posterior fossa and paraspinal soft tissues are normal. DISC LEVELS: Limited assessment due to motion degraded examination. Mid cervical facet arthropathy without canal stenosis at any level. Possible mild  RIGHT C3-4 neural foraminal narrowing. IMPRESSION: MRI HEAD: Mild to moderately motion degraded examination. Numerous foci of reduced diffusion most consistent with hyperacute/active demyelination, less likely infection/septic emboli. Severe chronic demyelination including supra and infratentorial white matter, cortex and deep gray nuclei lesions. Moderate parenchymal brain volume loss for age. MRI CERVICAL SPINE: Moderately motion degraded examination. C5 myelomalacia, additional spinal cord plaques consistent with chronic demyelination, relatively similar though limited assessment due to patient motion. No canal stenosis. Possible mild RIGHT C3-4 neural foraminal narrowing. Electronically Signed   By: Elon Alas M.D.   On: 08/17/2016 00:32   Mr Brain Wo Contrast  Result Date: 08/05/2016 CLINICAL DATA:  Altered mental status EXAM: MRI HEAD WITHOUT CONTRAST TECHNIQUE: Multiplanar, multiecho pulse sequences of the brain and surrounding structures were obtained without intravenous contrast. COMPARISON:  Head CT 08/04/2016 Brain MRI 01/24/2015 FINDINGS: Brain: Punctate focus of high signal on the diffusion-weighted images at the posterior aspect of the right basal ganglia is unchanged compared to 01/24/2015. There is an additional punctate focus of diffusion restriction in the posterior aspect of the left basal ganglia There is severe white matter disease with confluent lesions in the periventricular white matter, oriented perpendicularly to the long axis of the lateral ventricles. There is atrophy of the corpus callosum. There are numerous low T1 weighted signal lesions. No mass lesion or midline shift. No hydrocephalus  or extra-axial fluid collection. There is a left middle cranial fossa arachnoid cyst. 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 Vascular: Major intracranial arterial and venous sinus flow voids are preserved. No evidence of chronic microhemorrhage or amyloid angiopathy. Skull and upper cervical spine: The visualized skull base, calvarium, upper cervical spine and extracranial soft tissues are normal. Sinuses/Orbits: No fluid levels or advanced mucosal thickening. No mastoid effusion. Normal orbits. IMPRESSION: 1. Severe white matter disease in a pattern consistent with multiple sclerosis. Unchanged distribution of T2 hyperintense lesions. 2. Punctate focus of diffusion restriction at the posterior aspect of the left basal ganglia. These may indicate a tiny area of acute ischemia; however, active demyelinating MS plaques may uncommonly show diffusion restriction. Contrast administration might be helpful for differentiation, if necessary. Electronically Signed   By: Ulyses Jarred M.D.   On: 08/05/2016 03:27   Mr Brain W Contrast  Result Date: 08/17/2016 CLINICAL DATA:  Initial evaluation for acute altered mental status, history of multiple sclerosis, apparent active demyelination on recent brain MRI. EXAM: MRA HEAD WITHOUT CONTRAST MRI HEAD WITH CONTRAST TECHNIQUE: Angiographic images of the Circle of Willis were obtained using MRA technique with intravenous contrast. CONTRAST:  69m MULTIHANCE GADOBENATE DIMEGLUMINE 529 MG/ML IV SOLN COMPARISON:  Comparison made with prior MRI from 08/16/2016, as well as previous MRIs from 08/05/2016. FINDINGS: MRI HEAD FINDINGS: Study mildly degraded by motion artifact. Advanced cerebral atrophy for patient age. Multiple white matter lesions compatible with history of multiple sclerosis again seen, stable from recent examinations. No associated enhancement seen on these postcontrast images to suggest active demyelination. No  significant mass effect. No findings to suggest PML. Previously noted areas of restricted diffusion seen on recent MRI from 08/16/2016 still remains somewhat concerning for small foci of active demyelination. Acute or subacute ischemia could also be considered. No mass lesion, midline shift, or mass effect. Mild ventricular prominence based on global parenchymal volume loss. No hydrocephalus. No extra-axial fluid collection. Major dural sinuses are patent. Arachnoid cyst within the anterior left middle cranial fossa again noted. Major intracranial vascular flow voids maintained. Craniocervical  junction within normal limits. Visualized upper cervical spine grossly unremarkable. Bone marrow signal intensity within normal limits. No scalp soft tissue abnormality. Globes and orbital soft tissues within normal limits. Paranasal sinuses and mastoid air cells are clear. MRA HEAD FINDINGS: ANTERIOR CIRCULATION: Study mildly degraded by motion artifact. Distal cervical segments of the internal carotid arteries are patent with antegrade flow. Petrous, cavernous, and supraclinoid segments widely patent without flow-limiting stenosis. A1 segments patent. Right A1 segment slightly hypoplastic. Anterior communicating artery normal. Anterior cerebral arteries patent to their distal aspects. M1 segments patent without stenosis or occlusion. MCA bifurcations normal. No proximal M2 stenosis or occlusion. Distal MCA branches well opacified and symmetric. POSTERIOR CIRCULATION: Vertebral arteries widely patent to the vertebrobasilar junction. Basilar artery widely patent. Superior cerebellar arteries patent bilaterally. Both of the posterior cerebral arteries arise from the basilar artery and are well opacified to their distal aspects. No aneurysm or vascular malformation. IMPRESSION: MRI HEAD IMPRESSION: Multiple white matter lesions, compatible with history of multiple sclerosis. No abnormal enhancement on this exam to suggest active  demyelination. Previously noted diffusion signal abnormalities may reflect small areas of active demyelination that do not enhance. Acute or subacute ischemia could also be considered, although associated enhancement would be expected in the setting of subacute ischemic disease. MRA HEAD IMPRESSION: Normal intracranial MRA. Negative for large vessel occlusion. No high-grade or correctable stenosis. Electronically Signed   By: Jeannine Boga M.D.   On: 08/17/2016 20:26   Mr Brain W Contrast  Result Date: 08/05/2016 CLINICAL DATA:  51 year old female with history of multiple sclerosis and quadriplegia presenting with confusion and slurred speech 2 days ago. Cough with patient noted to be hypoxic on room air. Subsequent encounter. EXAM: MRI HEAD WITH CONTRAST TECHNIQUE: Multiplanar, multiecho pulse sequences of the brain and surrounding structures were obtained with intravenous contrast. CONTRAST:  11m MULTIHANCE GADOBENATE DIMEGLUMINE 529 MG/ML IV SOLN COMPARISON:  None. FINDINGS: Brain: Multiple white matter lesions consistent with demyelinating plaques none of which demonstrate enhancement as can be seen with active area of demyelination. No associated mass effect as can be seen with progressive multifocal leukoencephalopathy. Punctate region of restricted motion posterior limb of the right internal capsule may represent a small acute infarct or tiny area of active demyelination. Global atrophy. Anterior left middle cranial fossa arachnoid cyst. Vascular: Major intracranial vascular structures are patent. Skull and upper cervical spine: No acute abnormality. Sinuses/Orbits: No abnormal enhancement of the optic nerves. Other: Partially empty sella incidentally noted. IMPRESSION: Multiple white matter lesions consistent with demyelinating plaques none of which demonstrate enhancement as can be seen with active area of demyelination. No associated mass effect as can be seen with progressive multifocal  leukoencephalopathy. Punctate region of restricted motion posterior limb of the right internal capsule may represent a tiny acute infarct although it is still possible this represents a tiny area of active demyelination which does not enhance. Electronically Signed   By: SGenia DelM.D.   On: 08/05/2016 17:46   Mr Cervical Spine Wo Contrast  Result Date: 08/17/2016 CLINICAL DATA:  Altered mental status, history of multiple sclerosis. EXAM: MRI HEAD WITHOUT CONTRAST MRI CERVICAL SPINE WITHOUT CONTRAST TECHNIQUE: Multiplanar, multiecho pulse sequences of the brain and surrounding structures, and cervical spine, to include the craniocervical junction and cervicothoracic junction, were obtained without intravenous contrast. COMPARISON:  MRI head August 05, 2016 and MRI of the cervical spine January 24, 2015 FINDINGS: MRI HEAD FINDINGS- mild to moderately motion degraded examination. BRAIN: Multiple punctate to subcentimeter foci  of reduced diffusion largest within RIGHT mesial occipital lobe with rounded morphology, limited assessment on ADC map due to small size with no definite ADC abnormality. Given differences in field strength, these are similar to slightly worse. Numerous white matter ovoid T2 hyperintensities, dominant lesion and LEFT parietal lobe measuring 16 mm. Lesions demonstrate low T1 signal consistent with black holes of demyelination. Subcentimeter bilateral cerebellar lesions. Confluent white matter FLAIR T2 hyperintensities. Cortical RIGHT thalamic and probable bilateral basal ganglia lesions. Moderate ventriculomegaly on the basis of global parenchymal brain volume loss. No susceptibility artifact to suggest hemorrhage. No abnormal extra-axial fluid collections. Small LEFT middle cranial fossa arachnoid cyst. VASCULAR: Normal major intracranial vascular flow voids present at skull base. SKULL AND UPPER CERVICAL SPINE: No abnormal sellar expansion. No suspicious calvarial bone marrow signal.  Craniocervical junction maintained. SINUSES/ORBITS: The mastoid air-cells and included paranasal sinuses are well-aerated. The included ocular globes and orbital contents are non-suspicious. OTHER: None. MRI CERVICAL SPINE FINDINGS- moderately motion degraded examination. ALIGNMENT: Maintain cervical lordosis.  No malalignment. VERTEBRAE/DISCS: Vertebral bodies are intact. Intervertebral disc morphology's and signal are normal. CORD:17 mm segment of myelomalacia at C5, present on prior imaging. Patchy white matter T2 hyperintense signal upper cervical spinal cord, likely within the included thoracic spinal cord though limited by motion. POSTERIOR FOSSA, VERTEBRAL ARTERIES, PARASPINAL TISSUES: No MR findings of ligamentous injury. Vertebral artery flow voids present. Included posterior fossa and paraspinal soft tissues are normal. DISC LEVELS: Limited assessment due to motion degraded examination. Mid cervical facet arthropathy without canal stenosis at any level. Possible mild RIGHT C3-4 neural foraminal narrowing. IMPRESSION: MRI HEAD: Mild to moderately motion degraded examination. Numerous foci of reduced diffusion most consistent with hyperacute/active demyelination, less likely infection/septic emboli. Severe chronic demyelination including supra and infratentorial white matter, cortex and deep gray nuclei lesions. Moderate parenchymal brain volume loss for age. MRI CERVICAL SPINE: Moderately motion degraded examination. C5 myelomalacia, additional spinal cord plaques consistent with chronic demyelination, relatively similar though limited assessment due to patient motion. No canal stenosis. Possible mild RIGHT C3-4 neural foraminal narrowing. Electronically Signed   By: Elon Alas M.D.   On: 08/17/2016 00:32   Dg Chest Port 1 View  Result Date: 08/15/2016 CLINICAL DATA:  Cough and fever.  Left lower lobe pneumonia. EXAM: PORTABLE CHEST 1 VIEW COMPARISON:  08/12/2016 FINDINGS: There has been slight  improvement in the left lower lobe consolidation since the prior study. Right lung remains clear. Heart size and vascularity are normal. No acute bone abnormality. IMPRESSION: Partial clearing of the left lower lobe pneumonia. Electronically Signed   By: Lorriane Shire M.D.   On: 08/15/2016 10:59   Dg Chest Port 1 View  Result Date: 08/12/2016 CLINICAL DATA:  Chest pain, shortness of Breath EXAM: PORTABLE CHEST 1 VIEW COMPARISON:  08/10/2016 FINDINGS: Cardiomegaly. No confluent opacity on the right. Focal airspace opacity in the left lower lobe. No definite effusion or acute bony abnormality. IMPRESSION: Cardiomegaly. Left lower lobe opacity.  Cannot exclude pneumonia. Electronically Signed   By: Rolm Baptise M.D.   On: 08/12/2016 12:26   Dg Chest Port 1 View  Result Date: 08/10/2016 CLINICAL DATA:  Bilateral pneumonia, cough, congestion EXAM: PORTABLE CHEST 1 VIEW COMPARISON:  08/09/2016 FINDINGS: There is mild bilateral interstitial thickening with significant interval improvement in the left mid and lower lung airspace opacities. There is no pleural effusion or pneumothorax. The heart mediastinum are stable. The osseous structures are unremarkable. IMPRESSION: 1. There is mild bilateral interstitial thickening with significant interval  improvement in the left mid and lower lung airspace opacities. Electronically Signed   By: Kathreen Devoid   On: 08/10/2016 07:56   Dg Chest Port 1 View  Result Date: 08/09/2016 CLINICAL DATA:  Cough and congestion EXAM: PORTABLE CHEST 1 VIEW COMPARISON:  August 08, 2016 FINDINGS: There is persistent airspace consolidation throughout the left mid lower lung zones with small left pleural effusion. The right lung is clear. Heart is mildly enlarged with pulmonary vascularity within normal limits. No adenopathy. No bone lesions. IMPRESSION: Persistent extensive airspace consolidation, likely pneumonia, on the left with small left pleural effusion. Right lung clear. Stable  cardiac prominence. Followup PA and lateral chest radiographs recommended in 3-4 weeks following trial of antibiotic therapy to ensure resolution and exclude underlying malignancy. Electronically Signed   By: Lowella Grip III M.D.   On: 08/09/2016 09:05   Dg Chest Port 1 View  Result Date: 08/08/2016 CLINICAL DATA:  Hypoxia EXAM: PORTABLE CHEST 1 VIEW COMPARISON:  August 08, 2016 study obtained earlier in the day FINDINGS: There is airspace consolidation throughout the mid and lower lung zones. There has been partial clearing of infiltrate from the right base. No new opacity is evident. There is cardiomegaly with pulmonary vascularity within normal limits. No adenopathy. No bone lesions. IMPRESSION: Interval partial clearing from right base. Persistent consolidation left mid and lower lung zones. Stable cardiomegaly. Electronically Signed   By: Lowella Grip III M.D.   On: 08/08/2016 15:53   Dg Chest Port 1 View  Result Date: 08/08/2016 CLINICAL DATA:  51 year old female with a history of shortness of breath EXAM: PORTABLE CHEST 1 VIEW COMPARISON:  08/07/2016, 08/04/2016 FINDINGS: Cardiomediastinal silhouette unchanged. Compared to the most recent chest x-ray there is persisting mixed interstitial and airspace opacity of the bilateral mid and lower lobes. Mild interlobular septal thickening. Obscuration of left hemidiaphragm and retrocardiac region. No pneumothorax. IMPRESSION: Persisting bilateral interstitial and airspace opacity, potentially representing a combination of edema, consolidation, atelectasis, and/or small pleural fluid. Signed, Dulcy Fanny. Earleen Newport, DO [facility] Electronically Signed   By: Corrie Mckusick D.O.   On: 08/08/2016 07:53   Dg Chest Port 1 View  Result Date: 08/07/2016 CLINICAL DATA:  Acute onset of hypoxia.  Initial encounter. EXAM: PORTABLE CHEST 1 VIEW COMPARISON:  Chest radiograph performed earlier today at  8:14 a.m. FINDINGS: Worsening bibasilar and bilateral central airspace opacification raises concern for worsening pneumonia, though pulmonary edema might have a similar appearance. A small left pleural effusion is noted. No pneumothorax is seen. The cardiomediastinal silhouette is mildly enlarged. No acute osseous abnormalities are seen. IMPRESSION: Worsening bibasilar and bilateral central airspace opacification raises concern for worsening pneumonia, though pulmonary edema might have a similar appearance. Small left pleural effusion noted. Mild cardiomegaly. Electronically Signed   By: Garald Balding M.D.   On: 08/07/2016 18:23   Dg Chest Port 1 View  Result Date: 08/07/2016 CLINICAL DATA:  Congestion and cough. EXAM: PORTABLE CHEST 1 VIEW COMPARISON:  08/04/2016 FINDINGS: Lungs are adequately inflated with persistent airspace opacification over the left mid to lower lung suggesting pneumonia. No definite effusion. Right lung is clear. Mild stable cardiomegaly. Remainder of the exam is unchanged. IMPRESSION: Persistent airspace process over the left mid to lower lung likely a pneumonia. Electronically Signed   By: Marin Olp M.D.   On: 08/07/2016 09:01    ASSESSMENT/PLAN:  Acute encephalopathy - has significantly improved, back to baseline, alert and oriented 3; CT head showed extensive  demyelination been superimposed small vessel disease, no acute infarct, hemorrhage or edema; EEG showed no seizures; neurology was consulted; MRI brain showed numerous hyperacute demyelination, severe chronic demyelination; MRI cervical spine showed C5 mild low malacia, additional spine cord proximal consistent with chronic demyelination stenosis; started on IV steroids by neurology for possible MS flare however, there was a concern for psychogenic component so CT reads were discontinued; MRA brain normal; for rehabilitation, PT and OT, for therapeutic strengthening exercises; fall precautions  Community acquired  pneumonia - chest x-ray showed significant for right middle lobe, left lower lobe pneumonia, treated for 7 days of IV Rocephin and azithromycin, respiratory panel positive for metapneumovirus; chest x-ray repeated 12/30 continues to show a left lower lobe opacity, chest x-ray showed partial clearing of left lower lobe pneumonia; continue Mucinex 600 mg 12 hour 2 tabs = 1200 mg by mouth twice a day 1 week for cough  Acute hypoxic respiratory failure - secondary to pneumonia, for cough mechanics; continue O2 when necessary  Acute diastolic CHF - no SOB; continue Lasix 40 mg 1 tab by mouth twice a day  History of MS with quadriplegia - continue Aubagio 14 mg 1 tab by mouth daily, Dantrium 50 mg 1 capsule by mouth 4 times a day and baclofen 10 mg 3 times a day and 15 mg daily at bedtime; continue acyclovir 400 mg 1 tab by mouth daily; follow-up with Dr. Felecia Shelling, neurology, in 2 weeks   Anxiety - continue Ativan 0.5 mg 1 tab by mouth daily when necessary 14 days, notify NP/M.D. if more days needed  Major depression continue Prozac 40 mg 1 capsule by mouth daily, amitriptyline 75 mg 1 tab by mouth daily at bedtime, Lamictal 200 mg 1 tab by mouth 3 times a day and Trileptal 150 mg 1 tab by mouth twice a day  Anemia of chronic disease - stable Lab Results  Component Value Date   HGB 11.0 (L) 08/16/2016   Overactive bladder - continue Vesicare 10 mg 1 tab by mouth daily  Hypertension - continue Bisoprolol 5 mg 1 tab by mouth daily  Hyperlipidemia - continue Lipitor 20 mg 1 tab by mouth every 6 p.m.  Ulcerative colitis - continue Pentasa 500 mg 2 capsules = 1000 mg by mouth 3 times a day  GERD - continue omeprazole 40 mg 1 capsule by mouth twice a day  Constipation - discontinue current Colace and start Colace 100 mg 1 capsule by mouth daily and and change MiraLAX to when necessary     Goals of care:  Short-term rehabilitation    Tristyn Pharris C. [place]  - NP [employer] [phone]

## 2016-08-21 NOTE — Telephone Encounter (Signed)
[place] [phone] Fax: [phone]

## 2016-08-22 ENCOUNTER — Non-Acute Institutional Stay (SKILLED_NURSING_FACILITY): Payer: Commercial Managed Care - PPO | Admitting: Internal Medicine

## 2016-08-22 ENCOUNTER — Encounter: Payer: Self-pay | Admitting: Internal Medicine

## 2016-08-22 DIAGNOSIS — D638 Anemia in other chronic diseases classified elsewhere: Secondary | ICD-10-CM

## 2016-08-22 DIAGNOSIS — G8250 Quadriplegia, unspecified: Secondary | ICD-10-CM | POA: Diagnosis not present

## 2016-08-22 DIAGNOSIS — G35 Multiple sclerosis: Secondary | ICD-10-CM | POA: Diagnosis not present

## 2016-08-22 DIAGNOSIS — I5031 Acute diastolic (congestive) heart failure: Secondary | ICD-10-CM

## 2016-08-22 DIAGNOSIS — N399 Disorder of urinary system, unspecified: Secondary | ICD-10-CM

## 2016-08-22 DIAGNOSIS — R5381 Other malaise: Secondary | ICD-10-CM

## 2016-08-22 DIAGNOSIS — K5190 Ulcerative colitis, unspecified, without complications: Secondary | ICD-10-CM | POA: Diagnosis not present

## 2016-08-22 DIAGNOSIS — J189 Pneumonia, unspecified organism: Secondary | ICD-10-CM

## 2016-08-22 DIAGNOSIS — J181 Lobar pneumonia, unspecified organism: Secondary | ICD-10-CM

## 2016-08-22 DIAGNOSIS — F411 Generalized anxiety disorder: Secondary | ICD-10-CM

## 2016-08-22 DIAGNOSIS — I10 Essential (primary) hypertension: Secondary | ICD-10-CM

## 2016-08-22 DIAGNOSIS — F322 Major depressive disorder, single episode, severe without psychotic features: Secondary | ICD-10-CM | POA: Diagnosis not present

## 2016-08-22 DIAGNOSIS — G9340 Encephalopathy, unspecified: Secondary | ICD-10-CM | POA: Insufficient documentation

## 2016-08-22 NOTE — Progress Notes (Signed)
LOCATION: [facility]  PCP: [place]   Code Status: Full Code  Goals of care: Advanced Directive information Advanced Directives 08/05/2016  Does Patient Have a Medical Advance Directive? Yes  Type of Paramedic of [place];Living will  Does patient want to make changes to medical advance directive? No - Patient declined  Copy of [place] in Chart? No - copy requested       Extended Emergency Contact Information Primary Emergency Contact: Yeates,Kelly Address: [address]          [place], [place] [address] Johnnette Litter of [place] Work Phone: [phone] Mobile Phone: [phone] Relation: Spouse   Allergies  Allergen Reactions  . Penicillins Rash    Has patient had a PCN reaction causing immediate rash, facial/tongue/throat swelling, SOB or lightheadedness with hypotension: NO Has patient had a PCN reaction causing severe rash involving mucus membranes or skin necrosis: NO Has patient had a PCN reaction that required hospitalization NO Has patient had a PCN reaction occurring within the last 10 years:NO If all of the above answers are "NO", then may proceed with Cephalosporin use.    Chief Complaint  Patient presents with  . New Admit To SNF    New Admission Visit      HPI:  Patient is a 51 y.o. female seen today for short term rehabilitation post hospital admission from 07/05/2016-08/19/2016 with altered mental status, respiratory distress with hypoxia and slurred speech. She was diagnosed with pneumonia and CHF exacerbation. She was started on IV antibiotic and IV diuresis. CT head was negative for acute changes. EEG did not show any active seizures. Neurology was consulted and MRI brain and MRI cervical spine was done consistent with numerous hyper acute demyelination and severe chronic demyelination. She was started on IV steroid for possible multiple sclerosis flare. 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 MRA brain was normal. She is seen in her room today with her caregiver at bedside. She has medical history of multiple sclerosis with quadriplegia, ulcerative colitis, anemia of chronic disease among others.  Review of Systems:  Constitutional: Negative for fever, chills, diaphoresis. she feels low in terms of energy. HENT: Negative for headache, congestion, nasal discharge, difficulty swallowing.   Eyes: Negative for blurred vision, double vision and discharge. Wears glasses.  Respiratory: Negative for shortness of breath and wheezing. Positive for cough with phlegm   Cardiovascular: Negative for chest pain, palpitations, leg swelling.  Gastrointestinal: Negative for heartburn, nausea, vomiting, abdominal pain. Last bowel movement was this morning. Genitourinary: Negative for dysuria and flank pain.  Musculoskeletal: Negative for fall in the facility.  positive for muscle spasm and pain. Skin: Negative for itching, rash.  Neurological: Negative for dizziness. Psychiatric/Behavioral: Positive for anxiety and depression   Past Medical History:  Diagnosis Date  . Headache   . Hypertension   . Kidney stones   . Movement disorder   . Multiple sclerosis ([place])   . Neuropathy ([place])   . Ulcerative colitis ([facility])   . Vision abnormalities    Past Surgical History:  Procedure Laterality Date  . KIDNEY STONE SURGERY     Social History:   reports that she has never smoked. She has never used smokeless tobacco. She reports that she does not drink alcohol or use drugs.  Family History  Problem Relation Age of Onset  . Dementia Mother   .  Hypertension Father   . Hyperlipidemia Father   . Diabetes Father   . Heart disease Father     Medications: Allergies as of 08/22/2016      Reactions   Penicillins Rash   Has patient had a PCN reaction causing  immediate rash, facial/tongue/throat swelling, SOB or lightheadedness with hypotension: NO Has patient had a PCN reaction causing severe rash involving mucus membranes or skin necrosis: NO Has patient had a PCN reaction that required hospitalization NO Has patient had a PCN reaction occurring within the last 10 years:NO If all of the above answers are "NO", then may proceed with Cephalosporin use.      Medication List       Accurate as of 08/22/16  2:21 PM. Always use your most recent med list.          acyclovir 400 MG tablet Commonly known as:  ZOVIRAX Take 400 mg by mouth daily. Take every day per patient   amitriptyline 75 MG tablet Commonly known as:  ELAVIL Take 1 tablet (75 mg total) by mouth at bedtime.   aspirin 81 MG chewable tablet Chew 1 tablet (81 mg total) by mouth daily.   atorvastatin 20 MG tablet Commonly known as:  LIPITOR Take 1 tablet (20 mg total) by mouth daily at 6 PM.   b complex vitamins tablet Take 1 tablet by mouth daily.   baclofen 10 MG tablet Commonly known as:  LIORESAL Take 1 tablet (10 mg total) by mouth 2 (two) times daily.   baclofen 10 MG tablet Commonly known as:  LIORESAL Take 1.5 tablets (15 mg total) by mouth at bedtime.   bisoprolol 5 MG tablet Commonly known as:  ZEBETA Take 1 tablet (5 mg total) by mouth daily.   cholecalciferol 1000 units tablet Commonly known as:  VITAMIN D Take 1,000 Units by mouth daily.   dantrolene 50 MG capsule Commonly known as:  DANTRIUM TAKE 1 CAPSULE 4 TIMES DAILY   diphenoxylate-atropine 2.5-0.025 MG tablet Commonly known as:  LOMOTIL Take 2 tablets by mouth 4 (four) times daily as needed for diarrhea or loose stools.   docusate sodium 100 MG capsule Commonly known as:  COLACE Take 1 capsule (100 mg total) by mouth 2 (two) times daily.   FLUoxetine 40 MG capsule Commonly known as:  PROZAC Take 1 capsule (40 mg total) by mouth daily.   FOLIC ACID PO Take 1 tablet by mouth daily.     furosemide 40 MG tablet Commonly known as:  LASIX Take 1 tablet (40 mg total) by mouth 2 (two) times daily.   guaiFENesin 600 MG 12 hr tablet Commonly known as:  MUCINEX Take 2 tablets (1,200 mg total) by mouth 2 (two) times daily.   ibuprofen 200 MG tablet Commonly known as:  ADVIL,MOTRIN Take 200 mg by mouth 2 (two) times daily.   lamoTRIgine 200 MG tablet Commonly known as:  LAMICTAL Take 1 tablet (200 mg total) by mouth 3 (three) times daily.   LORazepam 0.5 MG tablet Commonly known as:  ATIVAN Take 1 tablet (0.5 mg total) by mouth daily as needed for anxiety.   NUTRITIONAL SUPPLEMENT Liqd Take 120 mLs by mouth 2 (two) times daily.   omeprazole 40 MG capsule Commonly known as:  PRILOSEC Take 40 mg by mouth 2 (two) times daily.   OXcarbazepine 150 MG tablet Commonly known as:  TRILEPTAL Take 1 tablet (150 mg total) by mouth 3 (three) times daily.   oxyCODONE-acetaminophen 5-325 MG tablet Commonly known  as:  PERCOCET/ROXICET Take 1-2 tablets by mouth every 6 (six) hours as needed for moderate pain or severe pain. DNE 3gm of APAP/24 hours   PENTASA 500 MG CR capsule Generic drug:  mesalamine Take 500 mg by mouth 3 (three) times daily.   polyethylene glycol packet Commonly known as:  MIRALAX / GLYCOLAX Take 17 g by mouth daily.   sodium chloride 0.65 % Soln nasal spray Commonly known as:  OCEAN Place 1 spray into both nostrils as needed for congestion.   solifenacin 10 MG tablet Commonly known as:  VESICARE Take 10 mg by mouth daily.   Teriflunomide 14 MG Tabs Commonly known as:  AUBAGIO Take 1 tablet by mouth daily.   zinc oxide 20 % ointment Apply 1 application topically 3 (three) times daily.       Immunizations: Immunization History  Administered Date(s) Administered  . PPD Test 08/19/2016     Physical Exam: Vitals:   08/22/16 1415  BP: 124/72  Pulse: 89  Resp: 20  Temp: 98 F (36.7 C)  TempSrc: Oral  SpO2: 90%  Weight: 171 lb (77.6  kg)  Height: 5' 3"  (1.6 m)   Body mass index is 30.29 kg/m.  General- Adult female, obese, in no acute distress Head- normocephalic, atraumatic Nose- no maxillary or frontal sinus tenderness, no nasal discharge Throat- moist mucus membrane Eyes- PERRLA, EOMI, no pallor, no icterus, no discharge, normal conjunctiva, normal sclera Neck- no cervical lymphadenopathy Cardiovascular- normal s1,s2, no murmur Respiratory- bilateral clear to auscultation, no wheeze, no rhonchi, no crackles, no use of accessory muscles Abdomen- bowel sounds present, soft, non tender Musculoskeletal- has quadriplegia Neurological- alert and oriented to person, place and time Skin- warm and dry Psychiatry- appears anxious, somewhat flat affect   Labs reviewed: Basic Metabolic Panel:  Recent Labs  08/06/16 0416  08/17/16 0314 08/18/16 0536 08/19/16 0557  NA  --   < > 139 137 140  K  --   < > 4.1 4.4 4.0  CL  --   < > 96* 94* 96*  CO2  --   < > 33* 31 32  GLUCOSE  --   < > 116* 172* 104*  BUN  --   < > 30* 29* 33*  CREATININE  --   < > 1.19* 1.10* 0.99  CALCIUM  --   < > 9.2 9.1 9.9  MG 2.2  --   --   --   --   PHOS 2.4*  --   --   --   --   < > = values in this interval not displayed. Liver Function Tests:  Recent Labs  11/09/15 1351 08/04/16 1731  AST 16 28  ALT 14 24  ALKPHOS 82 67  BILITOT 0.4 0.6  PROT 6.4 6.0*  ALBUMIN 4.0 3.3*   No results for input(s): LIPASE, AMYLASE in the last 8760 hours. No results for input(s): AMMONIA in the last 8760 hours. CBC:  Recent Labs  11/09/15 1351 08/04/16 1731  08/13/16 0726 08/15/16 1651 08/16/16 0631  WBC 7.6 12.2*  < > 7.8 8.0 8.0  NEUTROABS 5.3 10.5*  --   --   --   --   HGB  --  10.2*  < > 10.5* 10.8* 11.0*  HCT 32.7* 32.8*  < > 32.4* 34.2* 35.2*  MCV 101* 101.5*  < > 97.9 100.3* 100.9*  PLT 213 147*  < > 200 218 226  < > = values in this interval not displayed.  Cardiac Enzymes: No results for input(s): CKTOTAL, CKMB, CKMBINDEX,  TROPONINI in the last 8760 hours. BNP: Invalid input(s): POCBNP CBG:  Recent Labs  08/15/16 0717  GLUCAP 99    Radiological Exams: Dg Chest 2 View  Result Date: 08/04/2016 CLINICAL DATA:  Hypoxia with cough EXAM: CHEST  2 VIEW COMPARISON:  05/30/2013 FINDINGS: Right lung is grossly clear. Airspace opacity is present within the left lower lobe and lingula consistent with pneumonia. Small left pleural effusion. Stable mild cardiomegaly. There is mild central vascular congestion. No pneumothorax. Scoliosis of the lower spine. IMPRESSION: 1. Moderate left mid and lower lung infiltrates with small left pleural effusion. 2. Mild cardiomegaly with central vascular congestion Electronically Signed   By: Donavan Foil M.D.   On: 08/04/2016 22:29   Ct Head Wo Contrast  Result Date: 08/15/2016 CLINICAL DATA:  Altered mental status. History of multiple sclerosis EXAM: CT HEAD WITHOUT CONTRAST TECHNIQUE: Contiguous axial images were obtained from the base of the skull through the vertex without intravenous contrast. COMPARISON:  Head CT August 04, 2016 and brain MRI August 05, 2016 FINDINGS: Brain: Mild diffuse atrophy is stable. There is an apparent arachnoid cyst with remodeling in the anterior left temporal region measuring 3.0 x 1.3 cm, stable. There is no new mass, hemorrhage, extra-axial fluid collection, or midline shift. Extensive decreased attenuation throughout the periventricular white matter remains stable. No new lesion is evident in gray-white compartment regions. Vascular: There is no hyperdense vessel. There is no appreciable vascular calcification. Skull: The bony calvarium appears intact. Sinuses/Orbits: There are multiple areas of opacification in ethmoid air cells bilaterally. There is a small air-fluid level in the right sphenoid region. Other paranasal sinuses are clear. Orbits appear symmetric bilaterally. Other: Mastoid air cells are clear. IMPRESSION: Widespread decreased  attenuation in the supratentorial white matter, likely due to extensive demyelination. There may be a degree of superimposed small vessel disease. At over acute appearing infarct is not evident. No hemorrhage or edema seen. 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 No new mass. There is multifocal paranasal sinus disease with air-fluid level in the right sphenoid sinus. With respect to assessment for potential active foci of demyelinating plaque, MR pre and post-contrast would be the imaging study of choice to further assess. Electronically Signed   By: Lowella Grip III M.D.   On: 08/15/2016 12:29   Ct Head Wo Contrast  Result Date: 08/04/2016 CLINICAL DATA:  Slurred speech and lightheadedness overnight. EXAM: CT HEAD WITHOUT CONTRAST TECHNIQUE: Contiguous axial images were obtained from the base of the skull through the vertex without intravenous contrast. COMPARISON:  01/24/2015 FINDINGS: Brain: There is no intracranial hemorrhage, mass or evidence of acute infarction. There is moderate generalized atrophy. There is moderate chronic microvascular ischemic change. There is no significant extra-axial fluid collection. No acute intracranial findings are evident. Vascular: No hyperdense vessel or unexpected calcification. Skull: Normal. Negative for fracture or focal lesion. Sinuses/Orbits: No acute finding. Other: None. IMPRESSION: No acute intracranial findings. There is moderate generalized atrophy and chronic appearing white matter hypodensities which likely represent small vessel ischemic disease. Electronically Signed   By: Andreas Newport M.D.   On: 08/04/2016 18:22   Mr Jodene Nam Head Wo Contrast  Result Date: 08/17/2016 CLINICAL DATA:  Initial evaluation for acute altered mental status, history of multiple sclerosis, apparent active demyelination on recent brain MRI. EXAM: MRA HEAD WITHOUT CONTRAST MRI HEAD WITH CONTRAST TECHNIQUE: Angiographic images of the Circle of Willis  were obtained using  MRA technique with intravenous contrast. CONTRAST:  43m MULTIHANCE GADOBENATE DIMEGLUMINE 529 MG/ML IV SOLN COMPARISON:  Comparison made with prior MRI from 08/16/2016, as well as previous MRIs from 08/05/2016. FINDINGS: MRI HEAD FINDINGS: Study mildly degraded by motion artifact. Advanced cerebral atrophy for patient age. Multiple white matter lesions compatible with history of multiple sclerosis again seen, stable from recent examinations. No associated enhancement seen on these postcontrast images to suggest active demyelination. No significant mass effect. No findings to suggest PML. Previously noted areas of restricted diffusion seen on recent MRI from 08/16/2016 still remains somewhat concerning for small foci of active demyelination. Acute or subacute ischemia could also be considered. No mass lesion, midline shift, or mass effect. Mild ventricular prominence based on global parenchymal volume loss. No hydrocephalus. No extra-axial fluid collection. Major dural sinuses are patent. Arachnoid cyst within the anterior left middle cranial fossa again noted. Major intracranial vascular flow voids maintained. Craniocervical junction within normal limits. Visualized upper cervical spine grossly unremarkable. Bone marrow signal intensity within normal limits. No scalp soft tissue abnormality. Globes and orbital soft tissues within normal limits. Paranasal sinuses and mastoid air cells are clear. MRA HEAD FINDINGS: ANTERIOR CIRCULATION: Study mildly degraded by motion artifact. Distal cervical segments of the internal carotid arteries are patent with antegrade flow. Petrous, cavernous, and supraclinoid segments widely patent without flow-limiting stenosis. A1 segments patent. Right A1 segment slightly hypoplastic. Anterior communicating artery normal. Anterior cerebral arteries patent to their distal aspects. M1 segments patent without stenosis or occlusion. MCA bifurcations normal. No proximal M2  stenosis or occlusion. Distal MCA branches well opacified and symmetric. POSTERIOR CIRCULATION: Vertebral arteries widely patent to the vertebrobasilar junction. Basilar artery widely patent. Superior cerebellar arteries patent bilaterally. Both of the posterior cerebral arteries arise from the basilar artery and are well opacified to their distal aspects. No aneurysm or vascular malformation. IMPRESSION: MRI HEAD IMPRESSION: Multiple white matter lesions, compatible with history of multiple sclerosis. No abnormal enhancement on this exam to suggest active demyelination. Previously noted diffusion signal abnormalities may reflect small areas of active demyelination that do not enhance. Acute or subacute ischemia could also be considered, although associated enhancement would be expected in the setting of subacute ischemic disease. MRA HEAD IMPRESSION: Normal intracranial MRA. Negative for large vessel occlusion. No high-grade or correctable stenosis. Electronically Signed   By: BJeannine BogaM.D.   On: 08/17/2016 20:26   Mr Brain Wo Contrast  Result Date: 08/17/2016 CLINICAL DATA:  Altered mental status, history of multiple sclerosis. EXAM: MRI HEAD WITHOUT CONTRAST MRI CERVICAL SPINE WITHOUT CONTRAST TECHNIQUE: Multiplanar, multiecho pulse sequences of the brain and surrounding structures, and cervical spine, to include the craniocervical junction and cervicothoracic junction, were obtained without intravenous contrast. COMPARISON:  MRI head August 05, 2016 and MRI of the cervical spine January 24, 2015 FINDINGS: MRI HEAD FINDINGS- mild to moderately motion degraded examination. BRAIN: Multiple punctate to subcentimeter foci of reduced diffusion largest within RIGHT mesial occipital lobe with rounded morphology, limited assessment on ADC map due to small size with no definite ADC abnormality. Given differences in field strength, these are similar to slightly worse. Numerous white matter ovoid T2  hyperintensities, dominant lesion and LEFT parietal lobe measuring 16 mm. Lesions demonstrate low T1 signal consistent with black holes of demyelination. Subcentimeter bilateral cerebellar lesions. Confluent white matter FLAIR T2 hyperintensities. Cortical RIGHT thalamic and probable bilateral basal ganglia lesions. Moderate ventriculomegaly on the basis of global parenchymal brain volume loss. No susceptibility artifact to suggest hemorrhage. No  abnormal extra-axial fluid collections. Small LEFT middle cranial fossa arachnoid cyst. VASCULAR: Normal major intracranial vascular flow voids present at skull base. SKULL AND UPPER CERVICAL SPINE: No abnormal sellar expansion. No suspicious calvarial bone marrow signal. Craniocervical junction maintained. SINUSES/ORBITS: The mastoid air-cells and included paranasal sinuses are well-aerated. The included ocular globes and orbital contents are non-suspicious. OTHER: None. MRI CERVICAL SPINE FINDINGS- moderately motion degraded examination. ALIGNMENT: Maintain cervical lordosis.  No malalignment. VERTEBRAE/DISCS: Vertebral bodies are intact. Intervertebral disc morphology's and signal are normal. CORD:17 mm segment of myelomalacia at C5, present on prior imaging. Patchy white matter T2 hyperintense signal upper cervical spinal cord, likely within the included thoracic spinal cord though limited by motion. POSTERIOR FOSSA, VERTEBRAL ARTERIES, PARASPINAL TISSUES: No MR findings of ligamentous injury. Vertebral artery flow voids present. Included posterior fossa and paraspinal soft tissues are normal. DISC LEVELS: Limited assessment due to motion degraded examination. Mid cervical facet arthropathy without canal stenosis at any level. Possible mild RIGHT C3-4 neural foraminal narrowing. IMPRESSION: MRI HEAD: Mild to moderately motion degraded examination. Numerous foci of reduced diffusion most consistent with hyperacute/active demyelination, less likely infection/septic  emboli. Severe chronic demyelination including supra and infratentorial white matter, cortex and deep gray nuclei lesions. Moderate parenchymal brain volume loss for age. MRI CERVICAL SPINE: Moderately motion degraded examination. C5 myelomalacia, additional spinal cord plaques consistent with chronic demyelination, relatively similar though limited assessment due to patient motion. No canal stenosis. Possible mild RIGHT C3-4 neural foraminal narrowing. Electronically Signed   By: Elon Alas M.D.   On: 08/17/2016 00:32   Mr Brain Wo Contrast  Result Date: 08/05/2016 CLINICAL DATA:  Altered mental status EXAM: MRI HEAD WITHOUT CONTRAST TECHNIQUE: Multiplanar, multiecho pulse sequences of the brain and surrounding structures were obtained without intravenous contrast. COMPARISON:  Head CT 08/04/2016 Brain MRI 01/24/2015 FINDINGS: Brain: Punctate focus of high signal on the diffusion-weighted images at the posterior aspect of the right basal ganglia is unchanged compared to 01/24/2015. There is an additional punctate focus of diffusion restriction in the posterior aspect of the left basal ganglia There is severe white matter disease with confluent lesions in the periventricular white matter, oriented perpendicularly to the long axis of the lateral ventricles. There is atrophy of the corpus callosum. There are numerous low T1 weighted signal lesions. No mass lesion or midline shift. No hydrocephalus or extra-axial fluid collection. There is a left middle cranial fossa arachnoid cyst. 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 Vascular: Major intracranial arterial and venous sinus flow voids are preserved. No evidence of chronic microhemorrhage or amyloid angiopathy. Skull and upper cervical spine: The visualized skull base, calvarium, upper cervical spine and extracranial soft tissues are normal. Sinuses/Orbits: No fluid levels or advanced mucosal thickening. No  mastoid effusion. Normal orbits. IMPRESSION: 1. Severe white matter disease in a pattern consistent with multiple sclerosis. Unchanged distribution of T2 hyperintense lesions. 2. Punctate focus of diffusion restriction at the posterior aspect of the left basal ganglia. These may indicate a tiny area of acute ischemia; however, active demyelinating MS plaques may uncommonly show diffusion restriction. Contrast administration might be helpful for differentiation, if necessary. Electronically Signed   By: Ulyses Jarred M.D.   On: 08/05/2016 03:27   Mr Brain W Contrast  Result Date: 08/17/2016 CLINICAL DATA:  Initial evaluation for acute altered mental status, history of multiple sclerosis, apparent active demyelination on recent brain MRI. EXAM: MRA HEAD WITHOUT CONTRAST MRI HEAD WITH CONTRAST TECHNIQUE: Angiographic images of  the Circle of Willis were obtained using MRA technique with intravenous contrast. CONTRAST:  46m MULTIHANCE GADOBENATE DIMEGLUMINE 529 MG/ML IV SOLN COMPARISON:  Comparison made with prior MRI from 08/16/2016, as well as previous MRIs from 08/05/2016. FINDINGS: MRI HEAD FINDINGS: Study mildly degraded by motion artifact. Advanced cerebral atrophy for patient age. Multiple white matter lesions compatible with history of multiple sclerosis again seen, stable from recent examinations. No associated enhancement seen on these postcontrast images to suggest active demyelination. No significant mass effect. No findings to suggest PML. Previously noted areas of restricted diffusion seen on recent MRI from 08/16/2016 still remains somewhat concerning for small foci of active demyelination. Acute or subacute ischemia could also be considered. No mass lesion, midline shift, or mass effect. Mild ventricular prominence based on global parenchymal volume loss. No hydrocephalus. No extra-axial fluid collection. Major dural sinuses are patent. Arachnoid cyst within the anterior left middle cranial fossa  again noted. Major intracranial vascular flow voids maintained. Craniocervical junction within normal limits. Visualized upper cervical spine grossly unremarkable. Bone marrow signal intensity within normal limits. No scalp soft tissue abnormality. Globes and orbital soft tissues within normal limits. Paranasal sinuses and mastoid air cells are clear. MRA HEAD FINDINGS: ANTERIOR CIRCULATION: Study mildly degraded by motion artifact. Distal cervical segments of the internal carotid arteries are patent with antegrade flow. Petrous, cavernous, and supraclinoid segments widely patent without flow-limiting stenosis. A1 segments patent. Right A1 segment slightly hypoplastic. Anterior communicating artery normal. Anterior cerebral arteries patent to their distal aspects. M1 segments patent without stenosis or occlusion. MCA bifurcations normal. No proximal M2 stenosis or occlusion. Distal MCA branches well opacified and symmetric. POSTERIOR CIRCULATION: Vertebral arteries widely patent to the vertebrobasilar junction. Basilar artery widely patent. Superior cerebellar arteries patent bilaterally. Both of the posterior cerebral arteries arise from the basilar artery and are well opacified to their distal aspects. No aneurysm or vascular malformation. IMPRESSION: MRI HEAD IMPRESSION: Multiple white matter lesions, compatible with history of multiple sclerosis. No abnormal enhancement on this exam to suggest active demyelination. Previously noted diffusion signal abnormalities may reflect small areas of active demyelination that do not enhance. Acute or subacute ischemia could also be considered, although associated enhancement would be expected in the setting of subacute ischemic disease. MRA HEAD IMPRESSION: Normal intracranial MRA. Negative for large vessel occlusion. No high-grade or correctable stenosis. Electronically Signed   By: BJeannine BogaM.D.   On: 08/17/2016 20:26   Mr Brain W Contrast  Result Date:  08/05/2016 CLINICAL DATA:  51year old female with history of multiple sclerosis and quadriplegia presenting with confusion and slurred speech 2 days ago. Cough with patient noted to be hypoxic on room air. Subsequent encounter. EXAM: MRI HEAD WITH CONTRAST TECHNIQUE: Multiplanar, multiecho pulse sequences of the brain and surrounding structures were obtained with intravenous contrast. CONTRAST:  158mMULTIHANCE GADOBENATE DIMEGLUMINE 529 MG/ML IV SOLN COMPARISON:  None. FINDINGS: Brain: Multiple white matter lesions consistent with demyelinating plaques none of which demonstrate enhancement as can be seen with active area of demyelination. No associated mass effect as can be seen with progressive multifocal leukoencephalopathy. Punctate region of restricted motion posterior limb of the right internal capsule may represent a small acute infarct or tiny area of active demyelination. Global atrophy. Anterior left middle cranial fossa arachnoid cyst. Vascular: Major intracranial vascular structures are patent. Skull and upper cervical spine: No acute abnormality. Sinuses/Orbits: No abnormal enhancement of the optic nerves. Other: Partially empty sella incidentally noted. IMPRESSION: Multiple white matter lesions consistent with  demyelinating plaques none of which demonstrate enhancement as can be seen with active area of demyelination. No associated mass effect as can be seen with progressive multifocal leukoencephalopathy. Punctate region of restricted motion posterior limb of the right internal capsule may represent a tiny acute infarct although it is still possible this represents a tiny area of active demyelination which does not enhance. Electronically Signed   By: Genia Del M.D.   On: 08/05/2016 17:46   Mr Cervical Spine Wo Contrast  Result Date: 08/17/2016 CLINICAL DATA:  Altered mental status, history of multiple sclerosis. EXAM: MRI HEAD WITHOUT CONTRAST MRI CERVICAL SPINE WITHOUT CONTRAST TECHNIQUE:  Multiplanar, multiecho pulse sequences of the brain and surrounding structures, and cervical spine, to include the craniocervical junction and cervicothoracic junction, were obtained without intravenous contrast. COMPARISON:  MRI head August 05, 2016 and MRI of the cervical spine January 24, 2015 FINDINGS: MRI HEAD FINDINGS- mild to moderately motion degraded examination. BRAIN: Multiple punctate to subcentimeter foci of reduced diffusion largest within RIGHT mesial occipital lobe with rounded morphology, limited assessment on ADC map due to small size with no definite ADC abnormality. Given differences in field strength, these are similar to slightly worse. Numerous white matter ovoid T2 hyperintensities, dominant lesion and LEFT parietal lobe measuring 16 mm. Lesions demonstrate low T1 signal consistent with black holes of demyelination. Subcentimeter bilateral cerebellar lesions. Confluent white matter FLAIR T2 hyperintensities. Cortical RIGHT thalamic and probable bilateral basal ganglia lesions. Moderate ventriculomegaly on the basis of global parenchymal brain volume loss. No susceptibility artifact to suggest hemorrhage. No abnormal extra-axial fluid collections. Small LEFT middle cranial fossa arachnoid cyst. VASCULAR: Normal major intracranial vascular flow voids present at skull base. SKULL AND UPPER CERVICAL SPINE: No abnormal sellar expansion. No suspicious calvarial bone marrow signal. Craniocervical junction maintained. SINUSES/ORBITS: The mastoid air-cells and included paranasal sinuses are well-aerated. The included ocular globes and orbital contents are non-suspicious. OTHER: None. MRI CERVICAL SPINE FINDINGS- moderately motion degraded examination. ALIGNMENT: Maintain cervical lordosis.  No malalignment. VERTEBRAE/DISCS: Vertebral bodies are intact. Intervertebral disc morphology's and signal are normal. CORD:17 mm segment of myelomalacia at C5, present on prior imaging. Patchy white matter T2  hyperintense signal upper cervical spinal cord, likely within the included thoracic spinal cord though limited by motion. POSTERIOR FOSSA, VERTEBRAL ARTERIES, PARASPINAL TISSUES: No MR findings of ligamentous injury. Vertebral artery flow voids present. Included posterior fossa and paraspinal soft tissues are normal. DISC LEVELS: Limited assessment due to motion degraded examination. Mid cervical facet arthropathy without canal stenosis at any level. Possible mild RIGHT C3-4 neural foraminal narrowing. IMPRESSION: MRI HEAD: Mild to moderately motion degraded examination. Numerous foci of reduced diffusion most consistent with hyperacute/active demyelination, less likely infection/septic emboli. Severe chronic demyelination including supra and infratentorial white matter, cortex and deep gray nuclei lesions. Moderate parenchymal brain volume loss for age. MRI CERVICAL SPINE: Moderately motion degraded examination. C5 myelomalacia, additional spinal cord plaques consistent with chronic demyelination, relatively similar though limited assessment due to patient motion. No canal stenosis. Possible mild RIGHT C3-4 neural foraminal narrowing. Electronically Signed   By: Elon Alas M.D.   On: 08/17/2016 00:32   Dg Chest Port 1 View  Result Date: 08/15/2016 CLINICAL DATA:  Cough and fever.  Left lower lobe pneumonia. EXAM: PORTABLE CHEST 1 VIEW COMPARISON:  08/12/2016 FINDINGS: There has been slight improvement in the left lower lobe consolidation since the prior study. Right lung remains clear. Heart size and vascularity are normal. No acute bone abnormality. IMPRESSION: Partial clearing of  the left lower lobe pneumonia. Electronically Signed   By: Lorriane Shire M.D.   On: 08/15/2016 10:59   Dg Chest Port 1 View  Result Date: 08/12/2016 CLINICAL DATA:  Chest pain, shortness of Breath EXAM: PORTABLE CHEST 1 VIEW COMPARISON:  08/10/2016 FINDINGS: Cardiomegaly. No confluent opacity on the right. Focal airspace  opacity in the left lower lobe. No definite effusion or acute bony abnormality. IMPRESSION: Cardiomegaly. Left lower lobe opacity.  Cannot exclude pneumonia. Electronically Signed   By: Rolm Baptise M.D.   On: 08/12/2016 12:26   Dg Chest Port 1 View  Result Date: 08/10/2016 CLINICAL DATA:  Bilateral pneumonia, cough, congestion EXAM: PORTABLE CHEST 1 VIEW COMPARISON:  08/09/2016 FINDINGS: There is mild bilateral interstitial thickening with significant interval improvement in the left mid and lower lung airspace opacities. There is no pleural effusion or pneumothorax. The heart mediastinum are stable. The osseous structures are unremarkable. IMPRESSION: 1. There is mild bilateral interstitial thickening with significant interval improvement in the left mid and lower lung airspace opacities. Electronically Signed   By: Kathreen Devoid   On: 08/10/2016 07:56   Dg Chest Port 1 View  Result Date: 08/09/2016 CLINICAL DATA:  Cough and congestion EXAM: PORTABLE CHEST 1 VIEW COMPARISON:  August 08, 2016 FINDINGS: There is persistent airspace consolidation throughout the left mid lower lung zones with small left pleural effusion. The right lung is clear. Heart is mildly enlarged with pulmonary vascularity within normal limits. No adenopathy. No bone lesions. IMPRESSION: Persistent extensive airspace consolidation, likely pneumonia, on the left with small left pleural effusion. Right lung clear. Stable cardiac prominence. Followup PA and lateral chest radiographs recommended in 3-4 weeks following trial of antibiotic therapy to ensure resolution and exclude underlying malignancy. Electronically Signed   By: Lowella Grip III M.D.   On: 08/09/2016 09:05   Dg Chest Port 1 View  Result Date: 08/08/2016 CLINICAL DATA:  Hypoxia EXAM: PORTABLE CHEST 1 VIEW COMPARISON:  August 08, 2016 study obtained earlier in the day FINDINGS: There is airspace consolidation throughout the mid and lower lung zones. There has  been partial clearing of infiltrate from the right base. No new opacity is evident. There is cardiomegaly with pulmonary vascularity within normal limits. No adenopathy. No bone lesions. IMPRESSION: Interval partial clearing from right base. Persistent consolidation left mid and lower lung zones. Stable cardiomegaly. Electronically Signed   By: Lowella Grip III M.D.   On: 08/08/2016 15:53   Dg Chest Port 1 View  Result Date: 08/08/2016 CLINICAL DATA:  51 year old female with a history of shortness of breath EXAM: PORTABLE CHEST 1 VIEW COMPARISON:  08/07/2016, 08/04/2016 FINDINGS: Cardiomediastinal silhouette unchanged. Compared to the most recent chest x-ray there is persisting mixed interstitial and airspace opacity of the bilateral mid and lower lobes. Mild interlobular septal thickening. Obscuration of left hemidiaphragm and retrocardiac region. No pneumothorax. IMPRESSION: Persisting bilateral interstitial and airspace opacity, potentially representing a combination of edema, consolidation, atelectasis, and/or small pleural fluid. Signed, Dulcy Fanny. Earleen Newport, DO [facility] Electronically Signed   By: Corrie Mckusick D.O.   On: 08/08/2016 07:53   Dg Chest Port 1 View  Result Date: 08/07/2016 CLINICAL DATA:  Acute onset of hypoxia.  Initial encounter. EXAM: PORTABLE CHEST 1 VIEW COMPARISON:  Chest radiograph performed earlier today at 8:14 a.m. FINDINGS: Worsening bibasilar and bilateral central airspace opacification raises concern for worsening pneumonia, though pulmonary edema might have a similar appearance. A small left pleural effusion is noted.  No pneumothorax is seen. The cardiomediastinal silhouette is mildly enlarged. No acute osseous abnormalities are seen. IMPRESSION: Worsening bibasilar and bilateral central airspace opacification raises concern for worsening pneumonia, though pulmonary edema might have a similar appearance. Small  left pleural effusion noted. Mild cardiomegaly. Electronically Signed   By: Garald Balding M.D.   On: 08/07/2016 18:23   Dg Chest Port 1 View  Result Date: 08/07/2016 CLINICAL DATA:  Congestion and cough. EXAM: PORTABLE CHEST 1 VIEW COMPARISON:  08/04/2016 FINDINGS: Lungs are adequately inflated with persistent airspace opacification over the left mid to lower lung suggesting pneumonia. No definite effusion. Right lung is clear. Mild stable cardiomegaly. Remainder of the exam is unchanged. IMPRESSION: Persistent airspace process over the left mid to lower lung likely a pneumonia. Electronically Signed   By: Marin Olp M.D.   On: 08/07/2016 09:01    Assessment/Plan  Physical deconditioning From generalized weakness. Here for rehabilitation. Will need for her to work with physical therapy and occupational therapy to help restore her functions to baseline prior to hospitalization.  Community-acquired pneumonia Breathing stable at present. Patient remains afebrile. Has completed her antibiotic course. Monitor clinically. Continue Mucinex.  Acute diastolic congestive heart failure Currently on Lasix 40 mg twice a day, bisoprolol 5 mg daily. Monitor clinically. Check BMP.  Major depression Seen by psychiatry service in the hospital. Will get psychiatry team to follow her at the facility. Continue Prozac 40 mg daily, amitriptyline 75 mg daily and Lamictal 200 mg 3 times a day. Continue Trileptal.  Multiple sclerosis With quadriplegia. Patient was treated for acute flareup of multiple sclerosis in the hospital this admission. She is under total care with transfusions. Supportive care to be provided. Continue teriflunomide and follow with neurology.  Quadriplegia with spasticity Continue baclofen current regimen and Percocet 5-3 25 mg 1-2 tablets every 6 hours as needed for pain. Get PMR consult.  Anemia of chronic disease Monitor CBC  Anxiety Continue lorazepam 0.5 mg daily as needed and  get psychiatry consult.  Ulcerative colitis Continue mesalamine and monitor Gastroesophageal reflux disease Continue omeprazole 40 mg twice a day for now. Monitor.  Hypertension Continue bisoprolol 5 mg daily.  Urinary disorder Continue Vesicare. Continue perineal hygiene   Goals of care: short term rehabilitation   Labs/tests ordered: cbc, bmp  Family/ staff Communication: reviewed care plan with patient and nursing supervisor    Blanchie Serve, MD Internal Medicine [place], [place] [address] Cell Phone (Monday-Friday 8 am - 5 pm): [phone] On Call: [phone] and follow prompts after 5 pm and on weekends Office Phone: [phone] Office Fax: [phone]

## 2016-08-24 ENCOUNTER — Telehealth: Payer: Self-pay | Admitting: Neurology

## 2016-08-24 LAB — HEPATIC FUNCTION PANEL
ALK PHOS: 110 U/L (ref 25–125)
ALT: 16 U/L (ref 7–35)
AST: 19 U/L (ref 13–35)
BILIRUBIN, TOTAL: 0.6 mg/dL

## 2016-08-24 LAB — BASIC METABOLIC PANEL
BUN: 17 mg/dL (ref 4–21)
Creatinine: 1 mg/dL (ref 0.5–1.1)
Glucose: 98 mg/dL
Potassium: 3.9 mmol/L (ref 3.4–5.3)
SODIUM: 142 mmol/L (ref 137–147)

## 2016-08-24 LAB — CBC AND DIFFERENTIAL
HCT: 34 % — AB (ref 36–46)
HEMOGLOBIN: 10.7 g/dL — AB (ref 12.0–16.0)
NEUTROS ABS: 5 /uL
Platelets: 170 10*3/uL (ref 150–399)
WBC: 7.4 10^3/mL

## 2016-08-24 NOTE — Telephone Encounter (Signed)
LMOM for husband--pt. was admitted to the hospital, and then to rehab for pneumonia-fmla paperwork will need to go to her pcp or the physician that admitted her to the hospital.  He does not need to return this call unless he has further questions/fim

## 2016-08-24 NOTE — Telephone Encounter (Signed)
Pt's husband called said he will need FMLA forms filled out since the pt was in the hospital and now at [facility]. He was out full time all last week while she was in the hospital (that will be one leave) Intermittent leave will start 08/21/16. Advised he will bring the forms to the office. He is aware of the $50 fee  FYI

## 2016-09-04 ENCOUNTER — Telehealth: Payer: Self-pay | Admitting: *Deleted

## 2016-09-04 NOTE — Telephone Encounter (Signed)
FMLA paperwork for pt's husband completed, and sent up front [place] for form fee to be collected and paperwork to be faxed in/fim

## 2016-09-05 ENCOUNTER — Telehealth: Payer: Self-pay | Admitting: *Deleted

## 2016-09-05 NOTE — Telephone Encounter (Signed)
[employer] PA completed and faxed to [facility] fax# [phone]

## 2016-09-06 ENCOUNTER — Ambulatory Visit: Payer: Managed Care, Other (non HMO) | Admitting: Neurology

## 2016-09-07 ENCOUNTER — Non-Acute Institutional Stay (SKILLED_NURSING_FACILITY): Payer: Commercial Managed Care - PPO | Admitting: Adult Health

## 2016-09-07 ENCOUNTER — Encounter: Payer: Self-pay | Admitting: Adult Health

## 2016-09-07 DIAGNOSIS — K5190 Ulcerative colitis, unspecified, without complications: Secondary | ICD-10-CM

## 2016-09-07 DIAGNOSIS — R5381 Other malaise: Secondary | ICD-10-CM | POA: Diagnosis not present

## 2016-09-07 DIAGNOSIS — K219 Gastro-esophageal reflux disease without esophagitis: Secondary | ICD-10-CM | POA: Diagnosis not present

## 2016-09-07 DIAGNOSIS — E785 Hyperlipidemia, unspecified: Secondary | ICD-10-CM | POA: Diagnosis not present

## 2016-09-07 DIAGNOSIS — I10 Essential (primary) hypertension: Secondary | ICD-10-CM

## 2016-09-07 DIAGNOSIS — N3281 Overactive bladder: Secondary | ICD-10-CM | POA: Diagnosis not present

## 2016-09-07 DIAGNOSIS — I5031 Acute diastolic (congestive) heart failure: Secondary | ICD-10-CM | POA: Diagnosis not present

## 2016-09-07 DIAGNOSIS — J181 Lobar pneumonia, unspecified organism: Secondary | ICD-10-CM | POA: Diagnosis not present

## 2016-09-07 DIAGNOSIS — D638 Anemia in other chronic diseases classified elsewhere: Secondary | ICD-10-CM

## 2016-09-07 DIAGNOSIS — G35 Multiple sclerosis: Secondary | ICD-10-CM

## 2016-09-07 DIAGNOSIS — G35D Multiple sclerosis, unspecified: Secondary | ICD-10-CM

## 2016-09-07 DIAGNOSIS — Z0289 Encounter for other administrative examinations: Secondary | ICD-10-CM

## 2016-09-07 DIAGNOSIS — K5901 Slow transit constipation: Secondary | ICD-10-CM | POA: Diagnosis not present

## 2016-09-07 DIAGNOSIS — F322 Major depressive disorder, single episode, severe without psychotic features: Secondary | ICD-10-CM

## 2016-09-07 DIAGNOSIS — J189 Pneumonia, unspecified organism: Secondary | ICD-10-CM

## 2016-09-07 NOTE — Telephone Encounter (Signed)
Fax received from [place].  [employer] PA approved.  Effective dates: 09-06-16 thru 09-06-18.  PA# [employer] [place] [id number] MB/fim

## 2016-09-07 NOTE — Progress Notes (Signed)
DATE:  09/07/2016   MRN:  [id number]  BIRTHDAY: 1966-04-29  Facility:  Nursing Home Location:  [facility] Room Number: 1108-P  LEVEL OF CARE:  SNF [phone])  Contact Information    Name Relation Home Work [place] Spouse  [phone] [place] Daughter   [phone]       Code Status History    Date Active Date Inactive Code Status Order ID Comments User Context   08/05/2016  2:51 AM 08/19/2016  7:50 PM Full Code [id number]  Edwin Dada, MD ED       Chief Complaint  Patient presents with  . Discharge Note    HISTORY OF PRESENT ILLNESS:  This is a 50-YO female who is for discharge home with Home health PT, OT, CNA, Education officer, museum and Nursing.  She has been admitted to [facility] on 08/19/2016 for short-term rehabilitation following an admission at [facility] from 08/04/16-08/19/16 for cough, confusion, and slurred speech.  Workup was significant for CAP. She was transferred to stepdown on 12/25 due to respiratory distress and worsening hypoxia. Chest x-ray showed worsening bilateral infiltrates versus volume overload. Respiratory status improved with IV diuresis and respiratory panel significant for metapneumovirus. She was treated for a total of 7 days IV Rocephin and azithromycin.  She was recently treated for UTI with Macrobid.  Patient was admitted to this facility for short-term rehabilitation after the patient's recent hospitalization.  Patient has completed SNF rehabilitation and therapy has cleared the patient for discharge.   PAST MEDICAL HISTORY:  Past Medical History:  Diagnosis Date  . Headache   . Hypertension   . Kidney stones   . Movement disorder   . Multiple sclerosis ([place])   . Neuropathy ([place])   . Ulcerative colitis ([place])   . Vision abnormalities      CURRENT MEDICATIONS: Reviewed  Patient's Medications  New Prescriptions   No medications on file  Previous  Medications   ACETAMINOPHEN (TYLENOL) 500 MG TABLET    Take 500 mg by mouth every 6 (six) hours as needed for mild pain or moderate pain.   ACYCLOVIR (ZOVIRAX) 400 MG TABLET    Take 400 mg by mouth daily. Take every day per patient   AMITRIPTYLINE (ELAVIL) 75 MG TABLET    Take 1 tablet (75 mg total) by mouth at bedtime.   ASPIRIN 81 MG CHEWABLE TABLET    Chew 1 tablet (81 mg total) by mouth daily.   ATORVASTATIN (LIPITOR) 20 MG TABLET    Take 1 tablet (20 mg total) by mouth daily at 6 PM.   B COMPLEX VITAMINS TABLET    Take 1 tablet by mouth daily.    BACLOFEN (LIORESAL) 10 MG TABLET    Take 1.5 tablets (15 mg total) by mouth at bedtime.   BACLOFEN (LIORESAL) 10 MG TABLET    Take 10 mg by mouth 3 (three) times daily.   BISOPROLOL (ZEBETA) 5 MG TABLET    Take 1 tablet (5 mg total) by mouth daily.   CHOLECALCIFEROL (VITAMIN D) 1000 UNITS TABLET    Take 1,000 Units by mouth daily.    DANTROLENE (DANTRIUM) 50 MG CAPSULE    Take 50 mg by mouth 3 (three) times daily.   DOCUSATE SODIUM (COLACE) 100 MG CAPSULE    Take 100 mg by mouth daily.   FLUOXETINE (PROZAC) 40 MG CAPSULE    Take 1 capsule (40 mg total) by mouth daily.  FOLIC ACID PO    Take 175 mcg by mouth daily.    FUROSEMIDE (LASIX) 40 MG TABLET    Take 1 tablet (40 mg total) by mouth 2 (two) times daily.   LAMOTRIGINE (LAMICTAL) 200 MG TABLET    Take 1 tablet (200 mg total) by mouth 3 (three) times daily.   MESALAMINE (PENTASA) 500 MG CR CAPSULE    Take 1,000 mg by mouth 3 (three) times daily.    NUTRITIONAL SUPPLEMENT LIQD    Take 120 mLs by mouth 2 (two) times daily. [employer]   OMEPRAZOLE (PRILOSEC) 40 MG CAPSULE    Take 40 mg by mouth 2 (two) times daily.    OXCARBAZEPINE (TRILEPTAL) 150 MG TABLET    Take 1 tablet (150 mg total) by mouth 3 (three) times daily.   POLYETHYLENE GLYCOL (MIRALAX / GLYCOLAX) PACKET    Take 17 g by mouth daily.   SACCHAROMYCES BOULARDII (FLORASTOR) 250 MG CAPSULE    Take 250 mg by mouth 2 (two) times daily.    SODIUM CHLORIDE (OCEAN) 0.65 % SOLN NASAL SPRAY    Place 1 spray into both nostrils as needed for congestion.   SOLIFENACIN (VESICARE) 10 MG TABLET    Take 10 mg by mouth daily.    TERIFLUNOMIDE (AUBAGIO) 14 MG TABS    Take 1 tablet by mouth daily.   ZINC OXIDE 20 % OINTMENT    Apply 1 application topically 3 (three) times daily. Apply to bilateral buttocks every shift as a preventative measure  Modified Medications   No medications on file  Discontinued Medications   BACLOFEN (LIORESAL) 10 MG TABLET    Take 1 tablet (10 mg total) by mouth 2 (two) times daily.   DANTROLENE (DANTRIUM) 50 MG CAPSULE    TAKE 1 CAPSULE 4 TIMES DAILY   DIPHENOXYLATE-ATROPINE (LOMOTIL) 2.5-0.025 MG TABLET    Take 2 tablets by mouth 4 (four) times daily as needed for diarrhea or loose stools.   DOCUSATE SODIUM (COLACE) 100 MG CAPSULE    Take 1 capsule (100 mg total) by mouth 2 (two) times daily.   GUAIFENESIN (MUCINEX) 600 MG 12 HR TABLET    Take 2 tablets (1,200 mg total) by mouth 2 (two) times daily.   IBUPROFEN (ADVIL,MOTRIN) 200 MG TABLET    Take 200 mg by mouth 2 (two) times daily.   LORAZEPAM (ATIVAN) 0.5 MG TABLET    Take 1 tablet (0.5 mg total) by mouth daily as needed for anxiety.   OXYCODONE-ACETAMINOPHEN (PERCOCET/ROXICET) 5-325 MG TABLET    Take 1-2 tablets by mouth every 6 (six) hours as needed for moderate pain or severe pain. DNE 3gm of APAP/24 hours     Allergies  Allergen Reactions  . Penicillins Rash    Has patient had a PCN reaction causing immediate rash, facial/tongue/throat swelling, SOB or lightheadedness with hypotension: NO Has patient had a PCN reaction causing severe rash involving mucus membranes or skin necrosis: NO Has patient had a PCN reaction that required hospitalization NO Has patient had a PCN reaction occurring within the last 10 years:NO If all of the above answers are "NO", then may proceed with Cephalosporin use.     REVIEW OF SYSTEMS:  GENERAL: no change in appetite,  no fatigue, no weight changes, no fever, chills or weakness EYES: Denies change in vision, dry eyes, eye pain, itching or discharge EARS: Denies change in hearing, ringing in ears, or earache NOSE: Denies nasal congestion or epistaxis MOUTH and THROAT: Denies oral discomfort, gingival  pain or bleeding, pain from teeth or hoarseness   RESPIRATORY: no cough, SOB, DOE, wheezing, hemoptysis CARDIAC: no chest pain, edema or palpitations GI: no abdominal pain, diarrhea, constipation, heart burn, nausea or vomiting GU: Denies dysuria, frequency, hematuria, incontinence, or discharge NEUROLOGICAL: Denies dizziness, syncope, or headache PSYCHIATRIC: Denies feeling of depression or anxiety. No report of hallucinations, insomnia, paranoia, or agitation    PHYSICAL EXAMINATION  GENERAL APPEARANCE: Well nourished. In no acute distress. Obese SKIN:  Skin is warm and dry.  HEAD: Normal in size and contour. No evidence of trauma EYES: Lids open and close normally. No blepharitis, entropion or ectropion. PERRL. Conjunctivae are clear and sclerae are white. Lenses are without opacity EARS: Pinnae are normal. Patient hears normal voice tunes of the examiner MOUTH and THROAT: Lips are without lesions. Oral mucosa is moist and without lesions. 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 Affect and behavior are appropriate    LABS/RADIOLOGY: Labs reviewed: Basic Metabolic Panel:  Recent Labs  08/06/16 0416  08/17/16 0314  08/18/16 0536 08/19/16 0557 08/24/16  NA  --   < > 139 137 140 142  K  --   < > 4.1 4.4 4.0 3.9  CL  --   < > 96* 94* 96*  --   CO2  --   < > 33* 31 32  --   GLUCOSE  --   < > 116* 172* 104*  --   BUN  --   < > 30* 29* 33* 17  CREATININE  --   < > 1.19* 1.10* 0.99 1.0  CALCIUM  --   < > 9.2 9.1 9.9  --   MG 2.2  --   --   --   --   --   PHOS 2.4*  --   --   --   --   --   < > = values in this interval not displayed. Liver Function Tests:  Recent Labs  11/09/15 1351 08/04/16 1731 08/24/16  AST 16 28 19   ALT 14 24 16   ALKPHOS 82 67 110  BILITOT 0.4 0.6  --   PROT 6.4 6.0*  --   ALBUMIN 4.0 3.3*  --    CBC:  Recent Labs  11/09/15 1351 08/04/16 1731  08/13/16 0726 08/15/16 1651 08/16/16 0631 08/24/16  WBC 7.6 12.2*  < > 7.8 8.0 8.0 7.4  NEUTROABS 5.3 10.5*  --   --   --   --  5  HGB  --  10.2*  < > 10.5* 10.8* 11.0* 10.7*  HCT 32.7* 32.8*  < > 32.4* 34.2* 35.2* 34*  MCV 101* 101.5*  < > 97.9 100.3* 100.9*  --   PLT 213 147*  < > 200 218 226 170  < > = values in this interval not displayed.  Lipid Panel:  Recent Labs  08/06/16 0416  HDL 35*    CBG:  Recent Labs  08/15/16 0717  GLUCAP 99    Ct Head Wo Contrast  Result Date: 08/15/2016 CLINICAL DATA:  Altered mental status.  History of multiple sclerosis EXAM: CT HEAD WITHOUT CONTRAST TECHNIQUE: Contiguous axial images were obtained from the base of the skull through the vertex without intravenous contrast. COMPARISON:  Head CT August 04, 2016 and brain MRI August 05, 2016 FINDINGS: Brain: Mild diffuse atrophy is stable. There is an apparent arachnoid cyst with remodeling in the anterior left temporal region measuring 3.0 x 1.3 cm, stable. There is no new mass, hemorrhage, extra-axial fluid collection, or midline shift. Extensive decreased attenuation throughout the periventricular white matter remains stable. No new lesion is evident in gray-white compartment regions. Vascular: There is no hyperdense vessel.  There is no appreciable vascular calcification. Skull: The bony calvarium appears intact. Sinuses/Orbits: There are multiple areas of opacification in ethmoid air cells bilaterally. There is a small air-fluid level in the right sphenoid region. Other paranasal sinuses are clear. Orbits appear symmetric bilaterally. Other: Mastoid air cells are clear. IMPRESSION: Widespread decreased attenuation in the supratentorial white matter, likely due to extensive demyelination. There may be a degree of superimposed small vessel disease. At over acute appearing infarct is not evident. No hemorrhage or edema seen. 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 No new mass. There is multifocal paranasal sinus disease with air-fluid level in the right sphenoid sinus. With respect to assessment for potential active foci of demyelinating plaque, MR pre and post-contrast would be the imaging study of choice to further assess. Electronically Signed   By: Lowella Grip III M.D.   On: 08/15/2016 12:29   Mr Jodene Nam Head Wo Contrast  Result Date: 08/17/2016 CLINICAL DATA:  Initial evaluation for acute altered mental status, history of multiple sclerosis, apparent active demyelination on recent brain MRI. EXAM: MRA HEAD WITHOUT CONTRAST MRI HEAD WITH CONTRAST TECHNIQUE: Angiographic images of the Circle of Willis were obtained using MRA technique with intravenous contrast. CONTRAST:  60m MULTIHANCE GADOBENATE DIMEGLUMINE 529 MG/ML IV SOLN COMPARISON:  Comparison made with prior MRI from 08/16/2016, as well as previous MRIs from 08/05/2016. FINDINGS: MRI HEAD FINDINGS: Study mildly degraded by motion artifact. Advanced cerebral atrophy for patient age. Multiple white matter lesions compatible with history of multiple sclerosis again seen, stable from recent examinations. No associated enhancement seen on these postcontrast images to suggest active demyelination. No significant mass effect. No findings to suggest PML.  Previously noted areas of restricted diffusion seen on recent MRI from 08/16/2016 still remains somewhat concerning for small foci of active demyelination. Acute or subacute ischemia could also be considered. No mass lesion, midline shift, or mass effect. Mild ventricular prominence based on global parenchymal volume loss. No hydrocephalus. No extra-axial fluid collection. Major dural sinuses are patent. Arachnoid cyst within the anterior left middle cranial fossa again noted. Major intracranial vascular flow voids maintained. Craniocervical junction within normal limits. Visualized upper cervical spine grossly unremarkable. Bone marrow signal intensity within normal limits. No scalp soft tissue abnormality. Globes and orbital soft tissues within normal limits. Paranasal sinuses and mastoid air cells are clear. MRA HEAD FINDINGS: ANTERIOR CIRCULATION: Study mildly degraded by motion artifact. Distal cervical segments of the internal carotid arteries are patent with antegrade flow. Petrous, cavernous, and supraclinoid segments widely patent without flow-limiting stenosis. A1 segments patent. Right A1 segment slightly hypoplastic. Anterior communicating artery normal. Anterior cerebral arteries patent to their distal aspects. M1 segments patent without stenosis or occlusion. MCA bifurcations normal. No proximal M2 stenosis or occlusion. Distal MCA branches well opacified and symmetric. POSTERIOR CIRCULATION: Vertebral arteries widely patent to the vertebrobasilar junction. Basilar artery widely  patent. Superior cerebellar arteries patent bilaterally. Both of the posterior cerebral arteries arise from the basilar artery and are well opacified to their distal aspects. No aneurysm or vascular malformation. IMPRESSION: MRI HEAD IMPRESSION: Multiple white matter lesions, compatible with history of multiple sclerosis. No abnormal enhancement on this exam to suggest active demyelination. Previously noted diffusion signal  abnormalities may reflect small areas of active demyelination that do not enhance. Acute or subacute ischemia could also be considered, although associated enhancement would be expected in the setting of subacute ischemic disease. MRA HEAD IMPRESSION: Normal intracranial MRA. Negative for large vessel occlusion. No high-grade or correctable stenosis. Electronically Signed   By: Jeannine Boga M.D.   On: 08/17/2016 20:26   Mr Brain Wo Contrast  Result Date: 08/17/2016 CLINICAL DATA:  Altered mental status, history of multiple sclerosis. EXAM: MRI HEAD WITHOUT CONTRAST MRI CERVICAL SPINE WITHOUT CONTRAST TECHNIQUE: Multiplanar, multiecho pulse sequences of the brain and surrounding structures, and cervical spine, to include the craniocervical junction and cervicothoracic junction, were obtained without intravenous contrast. COMPARISON:  MRI head August 05, 2016 and MRI of the cervical spine January 24, 2015 FINDINGS: MRI HEAD FINDINGS- mild to moderately motion degraded examination. BRAIN: Multiple punctate to subcentimeter foci of reduced diffusion largest within RIGHT mesial occipital lobe with rounded morphology, limited assessment on ADC map due to small size with no definite ADC abnormality. Given differences in field strength, these are similar to slightly worse. Numerous white matter ovoid T2 hyperintensities, dominant lesion and LEFT parietal lobe measuring 16 mm. Lesions demonstrate low T1 signal consistent with black holes of demyelination. Subcentimeter bilateral cerebellar lesions. Confluent white matter FLAIR T2 hyperintensities. Cortical RIGHT thalamic and probable bilateral basal ganglia lesions. Moderate ventriculomegaly on the basis of global parenchymal brain volume loss. No susceptibility artifact to suggest hemorrhage. No abnormal extra-axial fluid collections. Small LEFT middle cranial fossa arachnoid cyst. VASCULAR: Normal major intracranial vascular flow voids present at skull base.  SKULL AND UPPER CERVICAL SPINE: No abnormal sellar expansion. No suspicious calvarial bone marrow signal. Craniocervical junction maintained. SINUSES/ORBITS: The mastoid air-cells and included paranasal sinuses are well-aerated. The included ocular globes and orbital contents are non-suspicious. OTHER: None. MRI CERVICAL SPINE FINDINGS- moderately motion degraded examination. ALIGNMENT: Maintain cervical lordosis.  No malalignment. VERTEBRAE/DISCS: Vertebral bodies are intact. Intervertebral disc morphology's and signal are normal. CORD:17 mm segment of myelomalacia at C5, present on prior imaging. Patchy white matter T2 hyperintense signal upper cervical spinal cord, likely within the included thoracic spinal cord though limited by motion. POSTERIOR FOSSA, VERTEBRAL ARTERIES, PARASPINAL TISSUES: No MR findings of ligamentous injury. Vertebral artery flow voids present. Included posterior fossa and paraspinal soft tissues are normal. DISC LEVELS: Limited assessment due to motion degraded examination. Mid cervical facet arthropathy without canal stenosis at any level. Possible mild RIGHT C3-4 neural foraminal narrowing. IMPRESSION: MRI HEAD: Mild to moderately motion degraded examination. Numerous foci of reduced diffusion most consistent with hyperacute/active demyelination, less likely infection/septic emboli. Severe chronic demyelination including supra and infratentorial white matter, cortex and deep gray nuclei lesions. Moderate parenchymal brain volume loss for age. MRI CERVICAL SPINE: Moderately motion degraded examination. C5 myelomalacia, additional spinal cord plaques consistent with chronic demyelination, relatively similar though limited assessment due to patient motion. No canal stenosis. Possible mild RIGHT C3-4 neural foraminal narrowing. Electronically Signed   By: Elon Alas M.D.   On: 08/17/2016 00:32   Mr Brain W Contrast  Result Date: 08/17/2016 CLINICAL DATA:  Initial evaluation for  acute altered mental status,  history of multiple sclerosis, apparent active demyelination on recent brain MRI. EXAM: MRA HEAD WITHOUT CONTRAST MRI HEAD WITH CONTRAST TECHNIQUE: Angiographic images of the Circle of Willis were obtained using MRA technique with intravenous contrast. CONTRAST:  54m MULTIHANCE GADOBENATE DIMEGLUMINE 529 MG/ML IV SOLN COMPARISON:  Comparison made with prior MRI from 08/16/2016, as well as previous MRIs from 08/05/2016. FINDINGS: MRI HEAD FINDINGS: Study mildly degraded by motion artifact. Advanced cerebral atrophy for patient age. Multiple white matter lesions compatible with history of multiple sclerosis again seen, stable from recent examinations. No associated enhancement seen on these postcontrast images to suggest active demyelination. No significant mass effect. No findings to suggest PML. Previously noted areas of restricted diffusion seen on recent MRI from 08/16/2016 still remains somewhat concerning for small foci of active demyelination. Acute or subacute ischemia could also be considered. No mass lesion, midline shift, or mass effect. Mild ventricular prominence based on global parenchymal volume loss. No hydrocephalus. No extra-axial fluid collection. Major dural sinuses are patent. Arachnoid cyst within the anterior left middle cranial fossa again noted. Major intracranial vascular flow voids maintained. Craniocervical junction within normal limits. Visualized upper cervical spine grossly unremarkable. Bone marrow signal intensity within normal limits. No scalp soft tissue abnormality. Globes and orbital soft tissues within normal limits. Paranasal sinuses and mastoid air cells are clear. MRA HEAD FINDINGS: ANTERIOR CIRCULATION: Study mildly degraded by motion artifact. Distal cervical segments of the internal carotid arteries are patent with antegrade flow. Petrous, cavernous, and supraclinoid segments widely patent without flow-limiting stenosis. A1 segments patent.  Right A1 segment slightly hypoplastic. Anterior communicating artery normal. Anterior cerebral arteries patent to their distal aspects. M1 segments patent without stenosis or occlusion. MCA bifurcations normal. No proximal M2 stenosis or occlusion. Distal MCA branches well opacified and symmetric. POSTERIOR CIRCULATION: Vertebral arteries widely patent to the vertebrobasilar junction. Basilar artery widely patent. Superior cerebellar arteries patent bilaterally. Both of the posterior cerebral arteries arise from the basilar artery and are well opacified to their distal aspects. No aneurysm or vascular malformation. IMPRESSION: MRI HEAD IMPRESSION: Multiple white matter lesions, compatible with history of multiple sclerosis. No abnormal enhancement on this exam to suggest active demyelination. Previously noted diffusion signal abnormalities may reflect small areas of active demyelination that do not enhance. Acute or subacute ischemia could also be considered, although associated enhancement would be expected in the setting of subacute ischemic disease. MRA HEAD IMPRESSION: Normal intracranial MRA. Negative for large vessel occlusion. No high-grade or correctable stenosis. Electronically Signed   By: BJeannine BogaM.D.   On: 08/17/2016 20:26   Mr Cervical Spine Wo Contrast  Result Date: 08/17/2016 CLINICAL DATA:  Altered mental status, history of multiple sclerosis. EXAM: MRI HEAD WITHOUT CONTRAST MRI CERVICAL SPINE WITHOUT CONTRAST TECHNIQUE: Multiplanar, multiecho pulse sequences of the brain and surrounding structures, and cervical spine, to include the craniocervical junction and cervicothoracic junction, were obtained without intravenous contrast. COMPARISON:  MRI head August 05, 2016 and MRI of the cervical spine January 24, 2015 FINDINGS: MRI HEAD FINDINGS- mild to moderately motion degraded examination. BRAIN: Multiple punctate to subcentimeter foci of reduced diffusion largest within RIGHT mesial  occipital lobe with rounded morphology, limited assessment on ADC map due to small size with no definite ADC abnormality. Given differences in field strength, these are similar to slightly worse. Numerous white matter ovoid T2 hyperintensities, dominant lesion and LEFT parietal lobe measuring 16 mm. Lesions demonstrate low T1 signal consistent with black holes of demyelination. Subcentimeter bilateral cerebellar lesions. Confluent  white matter FLAIR T2 hyperintensities. Cortical RIGHT thalamic and probable bilateral basal ganglia lesions. Moderate ventriculomegaly on the basis of global parenchymal brain volume loss. No susceptibility artifact to suggest hemorrhage. No abnormal extra-axial fluid collections. Small LEFT middle cranial fossa arachnoid cyst. VASCULAR: Normal major intracranial vascular flow voids present at skull base. SKULL AND UPPER CERVICAL SPINE: No abnormal sellar expansion. No suspicious calvarial bone marrow signal. Craniocervical junction maintained. SINUSES/ORBITS: The mastoid air-cells and included paranasal sinuses are well-aerated. The included ocular globes and orbital contents are non-suspicious. OTHER: None. MRI CERVICAL SPINE FINDINGS- moderately motion degraded examination. ALIGNMENT: Maintain cervical lordosis.  No malalignment. VERTEBRAE/DISCS: Vertebral bodies are intact. Intervertebral disc morphology's and signal are normal. CORD:17 mm segment of myelomalacia at C5, present on prior imaging. Patchy white matter T2 hyperintense signal upper cervical spinal cord, likely within the included thoracic spinal cord though limited by motion. POSTERIOR FOSSA, VERTEBRAL ARTERIES, PARASPINAL TISSUES: No MR findings of ligamentous injury. Vertebral artery flow voids present. Included posterior fossa and paraspinal soft tissues are normal. DISC LEVELS: Limited assessment due to motion degraded examination. Mid cervical facet arthropathy without canal stenosis at any level. Possible mild RIGHT  C3-4 neural foraminal narrowing. IMPRESSION: MRI HEAD: Mild to moderately motion degraded examination. Numerous foci of reduced diffusion most consistent with hyperacute/active demyelination, less likely infection/septic emboli. Severe chronic demyelination including supra and infratentorial white matter, cortex and deep gray nuclei lesions. Moderate parenchymal brain volume loss for age. MRI CERVICAL SPINE: Moderately motion degraded examination. C5 myelomalacia, additional spinal cord plaques consistent with chronic demyelination, relatively similar though limited assessment due to patient motion. No canal stenosis. Possible mild RIGHT C3-4 neural foraminal narrowing. Electronically Signed   By: Elon Alas M.D.   On: 08/17/2016 00:32   Dg Chest Port 1 View  Result Date: 08/15/2016 CLINICAL DATA:  Cough and fever.  Left lower lobe pneumonia. EXAM: PORTABLE CHEST 1 VIEW COMPARISON:  08/12/2016 FINDINGS: There has been slight improvement in the left lower lobe consolidation since the prior study. Right lung remains clear. Heart size and vascularity are normal. No acute bone abnormality. IMPRESSION: Partial clearing of the left lower lobe pneumonia. Electronically Signed   By: Lorriane Shire M.D.   On: 08/15/2016 10:59   Dg Chest Port 1 View  Result Date: 08/12/2016 CLINICAL DATA:  Chest pain, shortness of Breath EXAM: PORTABLE CHEST 1 VIEW COMPARISON:  08/10/2016 FINDINGS: Cardiomegaly. No confluent opacity on the right. Focal airspace opacity in the left lower lobe. No definite effusion or acute bony abnormality. IMPRESSION: Cardiomegaly. Left lower lobe opacity.  Cannot exclude pneumonia. Electronically Signed   By: Rolm Baptise M.D.   On: 08/12/2016 12:26   Dg Chest Port 1 View  Result Date: 08/10/2016 CLINICAL DATA:  Bilateral pneumonia, cough, congestion EXAM: PORTABLE CHEST 1 VIEW COMPARISON:  08/09/2016 FINDINGS: There is mild bilateral interstitial thickening with significant interval  improvement in the left mid and lower lung airspace opacities. There is no pleural effusion or pneumothorax. The heart mediastinum are stable. The osseous structures are unremarkable. IMPRESSION: 1. There is mild bilateral interstitial thickening with significant interval improvement in the left mid and lower lung airspace opacities. Electronically Signed   By: Kathreen Devoid   On: 08/10/2016 07:56   Dg Chest Port 1 View  Result Date: 08/09/2016 CLINICAL DATA:  Cough and congestion EXAM: PORTABLE CHEST 1 VIEW COMPARISON:  August 08, 2016 FINDINGS: There is persistent airspace consolidation throughout the left mid lower lung zones with small left pleural effusion.  The right lung is clear. Heart is mildly enlarged with pulmonary vascularity within normal limits. No adenopathy. No bone lesions. IMPRESSION: Persistent extensive airspace consolidation, likely pneumonia, on the left with small left pleural effusion. Right lung clear. Stable cardiac prominence. Followup PA and lateral chest radiographs recommended in 3-4 weeks following trial of antibiotic therapy to ensure resolution and exclude underlying malignancy. Electronically Signed   By: Lowella Grip III M.D.   On: 08/09/2016 09:05    ASSESSMENT/PLAN:  Physical deconditioning - for Home health PT and OT, for therapeutic strengthening exercises; fall precautions  Community acquired pneumonia - resolved  Acute hypoxic respiratory failure - resolved  Acute diastolic CHF - no SOB; continue Lasix 40 mg 1 tab by mouth twice a day  Multiple Sclerosis with quadriplegia - continue Aubagio 14 mg 1 tab by mouth daily, Dantrium 50 mg 1 capsule by mouth 4 times a day and baclofen 10 mg 3 times a day and 15 mg daily at bedtime; continue acyclovir 400 mg 1 tab by mouth daily; follow-up with Dr. Felecia Shelling, neurology   Major depression -  continue Prozac 40 mg 1 capsule by mouth daily, amitriptyline 75 mg 1 tab by mouth daily at bedtime, Lamictal 200 mg 1 tab  by mouth 3 times a day and Trileptal 150 mg 1 tab by mouth twice a day  Anemia of chronic disease - stable Lab Results  Component Value Date   HGB 10.7 (A) 08/24/2016   Overactive bladder - continue Vesicare 10 mg 1 tab by mouth daily  Hypertension - continue Bisoprolol 5 mg 1 tab by mouth daily  Hyperlipidemia - continue Lipitor 20 mg 1 tab by mouth every 6 p.m.  Ulcerative colitis - continue Pentasa 500 mg 2 capsules = 1000 mg by mouth 3 times a day  GERD - continue omeprazole 40 mg 1 capsule by mouth twice a day  Constipation - continue Colace 100 mg 1 capsule by mouth daily and and change MiraLAX to when necessary     I have filled out patient's discharge paperwork and written prescriptions.  Patient will receive home health PT, OT, SW,  Nursing and CNA.  DME provided:   Clinical research associate  Total discharge time: Greater than 30 minutes Greater than 50% was spent in counseling and coordination of care with the patient.   Discharge time involved coordination of the discharge process with social worker, nursing staff and therapy department. Medical justification for home health services/DME verified.     Cliford Sequeira C. [place]  - NP [employer] [phone]

## 2016-09-10 ENCOUNTER — Other Ambulatory Visit: Payer: Self-pay | Admitting: Neurology

## 2016-09-14 ENCOUNTER — Ambulatory Visit: Payer: Commercial Managed Care - PPO | Attending: Neurology | Admitting: Physical Therapy

## 2016-09-14 DIAGNOSIS — M6281 Muscle weakness (generalized): Secondary | ICD-10-CM

## 2016-09-14 DIAGNOSIS — R2689 Other abnormalities of gait and mobility: Secondary | ICD-10-CM | POA: Diagnosis present

## 2016-09-14 NOTE — Therapy (Signed)
[facility] [address] [place], [place], [address] Phone: [phone]   Fax:  [phone]  Physical Therapy Evaluation  Patient Details  Name: Vanessa Short MRN: [id number] Date of Birth: Aug 09, 1966 Referring Provider: Dr. Arlice Colt  Encounter Date: 09/14/2016      PT End of Session - 09/14/16 1223    Visit Number 1   Number of Visits 1   Authorization Type [employer]   Authorization Time Period 09-14-16 - 10-12-16   PT Start Time 1020   PT Stop Time 1206   PT Time Calculation (min) 106 min      Past Medical History:  Diagnosis Date  . Headache   . Hypertension   . Kidney stones   . Movement disorder   . Multiple sclerosis ([place])   . Neuropathy ([place])   . Ulcerative colitis ([place])   . Vision abnormalities     Past Surgical History:  Procedure Laterality Date  . KIDNEY STONE SURGERY      There were no vitals filed for this visit.       Subjective Assessment - 09/14/16 1221    Subjective Pt presents to PT for power wheelchair evaluation with CNA, Vanessa Short and spouse, Vanessa Short; Erlene Quan, ATP from [employer] present for consult   Pertinent History MS, scoliosis   Currently in Pain? No/denies            [facility] PT Assessment - 09/14/16 0001      Assessment   Medical Diagnosis Multiple Sclerosis   Referring Provider Dr. Arlice Colt   Onset Date/Surgical Date --  1990            Mobility/Seating Evaluation    PATIENT INFORMATION: Name: Vanessa Short DOB: 12-Jun-1966  Sex: F Date seen: 09-14-16 Time: 1015  Address:  [address]                 [place], [place] [address] Physician: Dr. Arlice Colt This evaluation/justification form will serve as the LMN for the following suppliers: __________________________ Supplier: [employer] Contact Person: Deberah Pelton, ATP Phone:  [phone]   Seating Therapist: Guido Sander, PT Phone:   [phone]   Phone: [phone]     Spouse/Parent/Caregiver name: Vanessa Short  Phone number: [phone] Insurance/Payer: [employer]/Medicare A and B     Reason for Referral: power wheelchair evaluation  Patient/Caregiver Goals: obtain new power wheelchair  Patient was seen for face-to-face evaluation for new power wheelchair.  Also present was Deberah Pelton, ATP to discuss recommendations and wheelchair options.  Further paperwork was completed and sent to vendor.  Patient appears to qualify for power mobility device at this time per objective findings.   MEDICAL HISTORY: Diagnosis: Primary Diagnosis: Multiple Sclerosis Onset: 1990 for initial diagnosis Diagnosis: Scoliosis   [x] Progressive Disease Relevant past and future surgeries: None   Height: 5'2" Weight: 170# Explain recent changes or trends in weight: ?????   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 Specific requirements pertaining to mobility ?????  Other: ?????    FUNCTIONAL/SENSORY PROCESSING SKILLS:  Handedness:   [x] Right     [] Left    [] NA  Comments:  ?????  Functional Processing Skills for Wheeled Mobility [x] Processing Skills are adequate for safe wheelchair operation  Areas of concern than may interfere with safe operation of wheelchair Description of problem   []  Attention to environment      [] Judgment       []  Hearing  []  Vision or visual processing      [] Motor Planning  []  Fluctuations in Behavior  ?????    VERBAL COMMUNICATION: [x] WFL receptive [x]  WFL expressive [] Understandable  [] Difficult to understand  [] non-communicative []  Uses an augmented communication device  CURRENT SEATING / MOBILITY: Current Mobility Base:  [] None [] Dependent [] Manual [] Scooter [x] Power  Type of Control: ?????  Manufacturer:  [employer] TDX SP with tilt onlySize:  18 x 18Age: 8-10 yrs  Current Condition of Mobility Base:  chair is in disrepair: change in condition due to progressive MS disease process:  postural abnormality due to trunk  musc. weakness and scoliosis with pt leaning laterally toward left side   Current Wheelchair components:  ?????  Describe posture in present seating system:  ?????      SENSATION and SKIN ISSUES: Sensation [] Intact  [x] Impaired [] Absent  Level of sensation: tingling/nerve pain in Rt shoulder Pressure Relief: Able to perform effective pressure relief :    [] Yes  [x]  No Method: ???? If not, Why?: ?????  Skin Issues/Skin Integrity Current Skin Issues  [] Yes [x] No [] Intact []  Red area[]  Open Area  [] Scar Tissue [] At risk from prolonged sitting Where  ?????  History of Skin Issues  [] Yes [x] No Where  ????? When  ?????  Hx of skin flap surgeries  [] Yes [x] No Where  ????? When  ?????  Limited sitting tolerance [x] Yes [] No Hours spent sitting in wheelchair daily: 8 hours - pt reports sitting in wheelchair is very uncomfortable; has an infinite position recliner which she gets in when pain/edema in feet increases, necessitating change in position  Complaint of Pain:  Please describe: yes - only on coccyx bone- sharp pain per pt's description   Swelling/Edema: in hands and feet    ADL STATUS (in reference to wheelchair use):  Indep Assist Unable Indep with Equip Not assessed Comments  Dressing ????? ????? X ????? ????? performs from shower chair  Eating ????? ????? X  ????? ????? must be fed  Toileting ????? X ????? ????? ????? uses elevated commode seat and sit to stand lift- is dependent for transfers  Bathing ????? ????? X ????? ????? uses a roll- in shower and shower chair  Grooming/Hygiene ????? ????? X ????? ????? ?????  Meal Prep ????? ????? X ????? ????? ?????  IADLS ????? X ????? X ????? now unable to drive power wheelchair due to R hand deficits  Bowel Management: [x] Continent  [] Incontinent  [] Accidents Comments:  ?????  Bladder Management: [x] Continent  [] Incontinent  []   Accidents Comments:  occasional urgency incontinence     WHEELCHAIR SKILLS: Manual w/c Propulsion: [] UE or LE strength and endurance sufficient to participate in ADLs using manual wheelchair Arm : [] left [] right   [] Both      Distance: ????? Foot:  [] left [] right   [] Both  Operate Scooter: []  Strength, hand grip, balance and transfer appropriate for use [] Living environment is accessible for use of scooter  Operate Power w/c:  []  Std. Joystick   [x]  Alternative Controls Indep []  Assist []  Dependent/unable []  N/A []   [] Safe          []  Functional      Distance: ?????  Bed confined without wheelchair [x]  Yes []  No   STRENGTH/RANGE OF MOTION:  PASSIVE Range of Motion Strength  Shoulder L shoulder PROM is WFL's; R shoulder flexion 143 degrees: R shoulder abdct 100 degrees passively (pt has no active movement in RUE or LUE) 0/5 bil. UE's  Elbow Passive flexion/extension WFL's bil. UE's 0-1/5 bil. UE's  Wrist/Hand fingers on both hands are flexed due to flexor tone: passive extension is WFL's: bil. wrist flexion and extenison is WFL's 0/5 bil. UE's  Hip bil. hip PROM is WFL's 0/5 bil. LE's  Knee bil. knee PROM is WFL's 0/5 bil. LE's  Ankle bil. ankle PROM is WFL's 0/5 bil. LE's     MOBILITY/BALANCE:  []  Patient is totally dependent for mobility  ?????    Balance Transfers Ambulation  Sitting Balance: Standing Balance: []  Independent []  Independent/Modified Independent   []  WFL     []  WFL []  Supervision []  Supervision  []  Uses UE for balance  []  Supervision []  Min Assist []  Ambulates with Assist  ?????    []  Min Assist []  Min assist []  Mod Assist []  Ambulates with Device:      []  RW  []  StW  []  Cane  []  ?????  [x]  Mod Assist []  Mod assist []  Max assist   []  Max Assist []  Max assist []  Dependent []  Indep. Short Distance Only  []  Unable [x]  Unable [x]  Lift / Sling Required Distance (in feet)  ?????   []  Sliding board [x]  Unable to Ambulate (see explanation below)  Cardio Status:  [] Intact  [x]  Impaired   []  NA     ?????  Respiratory Status:  [] Intact   [x] Impaired   [] NA     had pneumonia; went to PCP prior to hospital admission and presented with low oxygen saturation rates; was on oxygen during hospital stay (initially 6 hours, decr. to 2L at D/C)   Orthotics/Prosthetics: None  Comments (Address manual vs power w/c vs scooter): Pt is unable to propel a manual wheelchair due to quadriplegia due to MS; pt has no active movement, with exception of shoulder shrug in bil. UE's and no active movement in bil. LE's; pt is unable to operate a scooter due to lack of trunk control and no UE strength (pt is totally dependent with transfers)         Anterior / Posterior Obliquity Rotation-Pelvis Scoliosis is contributing to trunk deformities  PELVIS    []  [x]  []   Neutral Posterior Anterior  []  [x]  []   WFL Rt elev Lt elev  []  [x]  []   WFL Right Left                      Anterior    Anterior     []  Fixed []  Other [x]  Partly Flexible []  Flexible   [x]  Fixed []   Other []  Partly Flexible  []  Flexible  []  Fixed []  Other [x]  Partly Flexible  []  Flexible   TRUNK  []  [x]  []   WFL ? Thoracic ? Lumbar  Kyphosis Lordosis  []  [x]  []   WFL Convex Convex  Right Left [] c-curve [] s-curve [] multiple  []  Neutral []  Left-anterior [x]  Right-anterior     [x]  Fixed []  Flexible []  Partly Flexible []  Other  [x]  Fixed []  Flexible []  Partly Flexible []  Other  []  Fixed              []  Flexible [x]  Partly Flexible []  Other    Position Windswept  RLE is slightly externally rotated  HIPS          []            [x]               []    Neutral       Abduct        ADduct         [x]           []            []   Neutral Right           Left      []  Fixed []  Subluxed [x]  Partly Flexible []  Dislocated []  Flexible  []  Fixed []  Other [x]  Partly Flexible  []  Flexible                 Foot Positioning Knee Positioning  ?????    [x]  WFL  [x] Lt [x] Rt [x]  WFL  [x] Lt [x] Rt    KNEES ROM concerns: ROM concerns:    & Dorsi-Flexed [] Lt [] Rt ?????    FEET Plantar Flexed [] Lt [] Rt      Inversion                 [] Lt [] Rt      Eversion                 [] Lt [] Rt     HEAD []  Functional []  Good Head Control  ?????  & [x]  Flexed         []  Extended [x]  Adequate Head Control    NECK []  Rotated  Lt  [x]  Lat Flexed Lt []  Rotated  Rt []  Lat Flexed Rt []  Limited Head Control     []  Cervical Hyperextension []  Absent  Head Control     SHOULDERS ELBOWS WRIST& HAND bil. fingers are flexed due to spasticity      Left     Right    Left     Right    Left     Right   U/E [x] Functional           [] Functional WFL WFL [x] Fisting             [x] Fisting      [] elev   [] dep      [x] elev   [] dep       [] pro -[] retract     [x] pro  [] retract [] subluxed             [] subluxed           Goals for Wheelchair Mobility  [x]  Independence with mobility in the home with motor related ADLs (MRADLs)  []  Independence with MRADLs in the community []  Provide dependent mobility  [x]  Provide recline     [x] Provide tilt   Goals for Seating system [x]  Optimize pressure distribution [x]  Provide support needed to facilitate  function or safety []  Provide corrective forces to assist with maintaining or improving posture [x]  Accommodate client's posture:   current seated postures and positions are not flexible or will not tolerate corrective forces []  Client to be independent with relieving pressure in the  wheelchair [] Enhance physiological function such as breathing, swallowing, digestion  Simulation ideas/Equipment trials:????? State why other equipment was unsuccessful:?????   MOBILITY BASE RECOMMENDATIONS and JUSTIFICATION: MOBILITY COMPONENT JUSTIFICATION  Manufacturer: PermobilModel: M3   Size: Width 19Seat Depth 22 [x] provide transport from point A to B      [x] promote Indep mobility  [x] is not a safe, functional ambulator [x] walker or cane inadequate [] non-standard width/depth necessary to accommodate anatomical measurement []  ?????  [] Manual Mobility Base [] non-functional ambulator    [] Scooter/POV  [] can safely operate  [] can safely transfer   [] has adequate trunk stability  [] cannot functionally propel manual w/c  [x] Power Mobility Base  [x] non-ambulatory  [x] cannot functionally propel manual wheelchair  [x]  cannot functionally and safely operate scooter/POV [x] can safely operate and willing to  [] Stroller Base [] infant/child  [] unable to propel manual wheelchair [] allows for growth [] non-functional ambulator [] non-functional UE [] Indep mobility is not a goal at this time  [x] Tilt  [] Forward [] Backward [x] Powered tilt  [] Manual tilt  [x] change position against gravitational force on head and shoulders  [x] change position for pressure relief/cannot weight shift [x] transfers  [x] management of tone [x] rest periods [x] control edema [x] facilitate postural control  []  ?????  [x] Recline  [x] Power recline on power base [] Manual recline on manual base  [] accommodate femur to back angle  [x] bring to full recline for ADL care  [x] change position for pressure relief/cannot weight shift [x] rest periods [x] repositioning for transfers or clothing/diaper /catheter changes [] head positioning  [] Lighter weight required [] self- propulsion  [] lifting []  ?????  [] Heavy Duty required [] user weight greater than 250# [] extreme tone/ over active movement [] broken frame on previous  chair []  ?????  [x]  Back  []  Angle Adjustable []  Custom molded OBSS [x] postural control [x] control of tone/spasticity [] accommodation of range of motion [] UE functional control [] accommodation for seating system [x]  custom molded back needed as an off the shelf back will not support her positioning [x] provide lateral trunk support [x] accommodate deformity [x] provide posterior trunk support [x] provide lumbar/sacral support [x] support trunk in midline [x] Pressure relief over spinal processes  [x]  Seat Cushion Comfort Co Aduster X [x] impaired sensation  [] decubitus ulcers present [] history of pressure ulceration [x] prevent pelvic extension [] low maintenance  [x] stabilize pelvis  [x] accommodate obliquity [] accommodate multiple deformity [x] neutralize lower extremity position [x] increase pressure distribution [x]  at risk for skin breakdown  [x]  Pelvic/thigh support  [x]  Lateral thigh guide []  Distal medial pad  []  Distal lateral pad []  pelvis in neutral [] accommodate pelvis [x]  position upper legs [x]  alignment []  accommodate ROM []  decr adduction [] accommodate tone [x] removable for transfers [x] decr abduction  []  Lateral trunk Supports []  Lt     []  Rt [] decrease lateral trunk leaning [] control tone [] contour for increased contact [] safety  [] accommodate asymmetry []  ?????  [x]  Mounting hardware  [x] lateral trunk supports  [x] back   [x] seat [x] headrest      [x]  thigh support [] fixed   [] swing away [x] attach seat platform/cushion to w/c frame [x] attach back cushion to w/c frame [x] mount postural supports [x] mount headrest  [] swing medial thigh support away [x] swing lateral supports away for transfers  []  ?????    Armrests  [] fixed [x] adjustable height [] removable   [] swing away  [x] flip back   [] reclining [x] full length pads [] desk    [] pads tubular  [x] provide support with elbow at 90   []   provide support for w/c tray [x] change of height/angles for variable  activities [x] remove for transfers [] allow to come closer to table top [x] remove for access to tables []  ?????  Hangers/ Leg rests  [] 60 [] 70 [] 90 [x] elevating [] heavy duty  [x] articulating [] fixed [] lift off [] swing away     [] power [] provide LE support  [] accommodate to hamstring tightness [x] elevate legs during recline   [x] provide change in position for Legs [] Maintain placement of feet on footplate [] durability [] enable transfers [x] decrease edema [] Accommodate lower leg length []  ?????  Foot support Footplate    [] Lt  []  Rt  [x]  Center mount [x] flip up     [] depth/angle adjustable [] Amputee adapter    []  Lt     []  Rt [x] provide foot support [] accommodate to ankle ROM [] transfers [] Provide support for residual extremity []  allow foot to go under wheelchair base [x]  decrease tone  []  ?????  []  Ankle strap/heel loops [] support foot on foot support [] decrease extraneous movement [] provide input to heel  [] protect foot  Tires: [] pneumatic  [x] flat free inserts  [] solid  [x] decrease maintenance  [x] prevent frequent flats [] increase shock absorbency [] decrease pain from road shock [] decrease spasms from road shock []  ?????  [x]  Headrest  [x] provide posterior head support [x] provide posterior neck support [] provide lateral head support [] provide anterior head support [x] support during tilt and recline [] improve feeding   [] improve respiration [x] placement of switches [x] safety  [] accommodate ROM  [] accommodate tone [] improve visual orientation  []  Anterior chest strap [x]  Vest []  Shoulder retractors  [x] decrease forward movement of shoulder [] accommodation of TLSO [x] decrease forward movement of trunk [] decrease shoulder elevation [] added abdominal support [] alignment [x] assistance with shoulder control  []  ?????  Pelvic Positioner [x] Belt [] SubASIS bar [] Dual Pull [] stabilize tone [x] decrease falling out of chair/ **will not Decr potential for sliding due to  pelvic tilting [] prevent excessive rotation [] pad for protection over boney prominence [] prominence comfort [] special pull angle to control rotation []  ?????  Upper Extremity Support [] L   []  R [] Arm trough    [] hand support []  tray       [x] full tray [] swivel mount [] decrease edema      [] decrease subluxation   [] control tone   [] placement for AAC/Computer/EADL [x] decrease gravitational pull on shoulders [] provide midline positioning [x] provide support to increase UE function [x] provide hand support in natural position [x] provide work surface   POWER WHEELCHAIR CONTROLS  [x] Proportional  [] Non-Proportional Type head control [] Left  [] Right [x] provides access for controlling wheelchair   [x] lacks motor control to operate proportional drive control [] unable to understand proportional controls  Actuator Control Module  [] Single  [x] Multiple   [x] Allow the client to operate the power seat function(s) through the joystick control   [x] Safety Reset Switches [x] Used to change modes and stop the wheelchair when driving in latch mode    [x] Upgraded Electronics   [] programming for accurate control [] progressive Disease/changing condition [] non-proportional drive control needed [] Needed in order to operate power seat functions through joystick control   [x] Display box [x] Allows user to see in which mode and drive the wheelchair is set  [x] necessary for alternate controls    [] Digital interface electronics [] Allows w/c to operate when using alternative drive controls  [x] ASL Head Array [x] Allows client to operate wheelchair  through switches placed in tri-panel headrest  [] Sip and puff with tubing kit [] needed to operate sip and puff drive controls  [x] Upgraded tracking electronics [x] increase safety when driving [] correct tracking when on uneven surfaces  [x] Mount for switches or joystick [] Attaches switches to w/c  [x] Swing away for  access or transfers [] midline for optimal  placement [] provides for consistent access  [x] Attendant controlled joystick plus mount [x] safety [x] long distance driving [x] operation of seat functions [x] compliance with transportation regulations []  ?????    Rear wheel placement/Axle adjustability [] None [] semi adjustable [] fully adjustable  [] improved UE access to wheels [] improved stability [] changing angle in space for improvement of postural stability [] 1-arm drive access [] amputee pad placement []  ?????  Wheel rims/ hand rims  [] metal  [] plastic coated [] oblique projections [] vertical projections [] Provide ability to propel manual wheelchair  []  Increase self-propulsion with hand weakness/decreased grasp  Push handles [] extended  [] angle adjustable  [] standard [] caregiver access [] caregiver assist [] allows "hooking" to enable increased ability to perform ADLs or maintain balance  One armed device  [] Lt   [] Rt [] enable propulsion of manual wheelchair with one arm   []  ?????   Brake/wheel lock extension []  Lt   []  Rt [] increase indep in applying wheel locks   [] Side guards [] prevent clothing getting caught in wheel or becoming soiled []  prevent skin tears/abrasions  Battery: group 24 - a pair [x] to power wheelchair ?????  Other: Photographer   To be able to access cabinets, mirrors for increased independence with ADL's: Cup holder needed to transport beverage for hydration ?????  The above equipment has a life- long use expectancy. Growth and changes in medical and/or functional conditions would be the exceptions. This is to certify that the therapist has no financial relationship with durable medical provider or manufacturer. The therapist will not receive remuneration of any kind for the equipment recommended in this evaluation.   Patient has mobility limitation that significantly impairs safe, timely participation in one or more mobility related ADL's.  (bathing, toileting, feeding, dressing,  grooming, moving from room to room)                                                             [x]  Yes []  No Will mobility device sufficiently improve ability to participate and/or be aided in participation of MRADL's?         [x]  Yes []  No Can limitation be compensated for with use of a cane or walker?                                                                                []  Yes [x]  No Does patient or caregiver demonstrate ability/potential ability & willingness to safely use the mobility device?   [x]  Yes []  No Does patient's home environment support use of recommended mobility device?                                                    [x]  Yes []  No Does patient have sufficient upper extremity function necessary to functionally propel a  manual wheelchair?    []  Yes [x]  No Does patient have sufficient strength and trunk stability to safely operate a POV (scooter)?                                  []  Yes [x]  No Does patient need additional features/benefits provided by a power wheelchair for MRADL's in the home?       [x]  Yes []  No Does the patient demonstrate the ability to safely use a power wheelchair?                                                              [x]  Yes []  No  Therapist Name Printed: Guido Sander, PT Date: 09-14-16  Therapist's Signature:   Date:   Supplier's Name Printed: Deberah Pelton, ATP Date: 09-14-16  Supplier's Signature:   Date:  Patient/Caregiver Signature:   Date:     This is to certify that I have read this evaluation and do agree with the content within:      Physician's Name Printed: Dr. Arlice Colt  Physician's Signature:  Date:     This is to certify that I, the above signed therapist have the following affiliations: []  This DME provider []  Manufacturer of recommended equipment []  Patient's long term care facility [x]  None of the above                              Plan - 09/14/16 1246    Clinical Impression  Statement Pt seen for power wheelchair evaluation with Deberah Pelton, ATP from [employer] - [employer] M3 with power tilt and recline recommended; pt has quadriplegia due to MS disease process   PT Frequency One time visit   PT Next Visit Plan eval only   Consulted and Agree with Plan of Care Patient;Family member/caregiver   Family Member Consulted spouse Vanessa Short      Patient will benefit from skilled therapeutic intervention in order to improve the following deficits and impairments:  Decreased mobility, Decreased strength  Visit Diagnosis: Other abnormalities of gait and mobility - Plan: PT plan of care cert/re-cert  Muscle weakness (generalized) - Plan: PT plan of care cert/re-cert      G-Codes - 16/10/96 1638    Functional Assessment Tool Used pt is dependent for all mobility   Functional Limitation Mobility: Walking and moving around   Mobility: Walking and Moving Around Current Status [phone]) 100 percent impaired, limited or restricted   Mobility: Walking and Moving Around Goal Status [phone] 100 percent impaired, limited or restricted   Mobility: Walking and Moving Around Discharge Status [phone]) 100 percent impaired, limited or restricted       Problem List Patient Active Problem List   Diagnosis Date Noted  . Acute encephalopathy 08/22/2016  . MDD (major depressive disorder), recurrent severe, without psychosis ([place]) 08/16/2016  . Altered mental state   . Acute respiratory failure with hypoxia ([place]) 08/07/2016  . CAP (community acquired pneumonia) 08/07/2016  . Left lower lobe pneumonia ([place]) 08/05/2016  . Hypokalemia 08/05/2016  . Normocytic anemia 08/05/2016  . Slurred speech 08/05/2016  . Ulcerative colitis ([place]) 08/05/2016  . Essential hypertension 08/05/2016  .  Subacromial bursitis 05/09/2016  . Visual hallucination 03/14/2016  . Other fatigue 11/09/2015  . Urinary disorder 11/09/2015  . Ear ache 10/13/2015  . Sore throat 10/13/2015  . Hand weakness 01/06/2015   . Multiple sclerosis ([place]) 09/07/2014  . 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 Dysesthesia 09/07/2014  . Depression 09/07/2014    Alda Lea, PT 09/14/2016, 4:49 PM  [facility] [address] [place], [place], [address] Phone: [phone]   Fax:  [phone]  Name: Amaka Gluth MRN: [id number] Date of Birth: [birth year]

## 2016-09-27 ENCOUNTER — Encounter: Payer: Self-pay | Admitting: Neurology

## 2016-09-27 ENCOUNTER — Ambulatory Visit (INDEPENDENT_AMBULATORY_CARE_PROVIDER_SITE_OTHER): Payer: Commercial Managed Care - PPO | Admitting: Neurology

## 2016-09-27 DIAGNOSIS — R441 Visual hallucinations: Secondary | ICD-10-CM | POA: Diagnosis not present

## 2016-09-27 DIAGNOSIS — R4182 Altered mental status, unspecified: Secondary | ICD-10-CM | POA: Diagnosis not present

## 2016-09-27 DIAGNOSIS — R5383 Other fatigue: Secondary | ICD-10-CM

## 2016-09-27 DIAGNOSIS — Z79899 Other long term (current) drug therapy: Secondary | ICD-10-CM | POA: Diagnosis not present

## 2016-09-27 DIAGNOSIS — R208 Other disturbances of skin sensation: Secondary | ICD-10-CM | POA: Diagnosis not present

## 2016-09-27 DIAGNOSIS — G8250 Quadriplegia, unspecified: Secondary | ICD-10-CM | POA: Diagnosis not present

## 2016-09-27 DIAGNOSIS — G35 Multiple sclerosis: Secondary | ICD-10-CM

## 2016-09-27 MED ORDER — BUSPIRONE HCL 15 MG PO TABS: 15.0000 mg | ORAL_TABLET | Freq: Two times a day (BID) | ORAL | 5 refills | Status: DC

## 2016-09-27 MED ORDER — AMITRIPTYLINE HCL 25 MG PO TABS: 25.0000 mg | ORAL_TABLET | Freq: Every day | ORAL | 11 refills | Status: DC

## 2016-09-27 MED ORDER — BUSPIRONE HCL 15 MG PO TABS: 15.0000 mg | ORAL_TABLET | Freq: Two times a day (BID) | ORAL | 3 refills | Status: DC

## 2016-09-27 MED ORDER — AMITRIPTYLINE HCL 25 MG PO TABS: 25.0000 mg | ORAL_TABLET | Freq: Every day | ORAL | 3 refills | Status: DC

## 2016-09-27 NOTE — Progress Notes (Signed)
[facility]  [name] DOB: 1965/11/15  REFERRING CLINICIAN: Adah Salvage, [facility] HISTORY FROM: patient  REASON FOR VISIT: MS   HISTORICAL  CHIEF COMPLAINT:  Chief Complaint  Patient presents with  . Multiple Sclerosis    Sts. she continues to tolerate Aubagio well, but feels she continues to decline and would like to discuss other tx options.    Recent hospitalization for flu/pneumonia.  Needs to discuss continued need for a power wheelchair/fim    HISTORY OF PRESENT ILLNESS:  Vanessa Short is a 51 year old woman diagnosed with MS in 1989.     Recent hospitalization:   She had pneumonia and flu right before Christmas leading to a hospitalization.   She had encephalopathy and was confused x several weeks.   She was discharged 08/19/16 and then spent 2 weeks at a SNF getting some PT.     She currently feels her cognition is mostly better - almost baseline.   However, she has more weakness, especially in the right arm.     She has not noted further improvement in strength in the past 2 weeks.     Gait/Strength:  She feels her legs are doing about the same but that she has more weakness in her arms, especially the right arm that had always been her strongest. She is having difficulty to control her electric wheelchair. She notes altered sensation in her arms.     She cannot walk and spends her day in a wheelchair.    She has spasticity in the legs and arms. She is on baclofen 10 mg by mouth 3-5 times a day and Dantrolene 50 mg po tid.   in the past, we tried Botox injections into the left arm but  Botox did not improve functioning.    She feels she cannot assist with transfers as well.   Sensation/pain:  She has right anterior shoulder region dysesthesia but this is milder than last year.  Gabapentin was not tolerated.    Lamotrigine 150 mg po bid with amitriptyline helped with dysesthesia at first but less now.   We just went up on the  lamotrigine.    Vision:   Vision is about the same.  She has mild decreased acuity and occasional diplopia.        Bladder/Bowel:  She has bladder dysfunction with some benefit form Vesicare.   She more incontinence and always uses Depends.  She is on Linzess due to many of her med's making constipation worse.  She also has ulcerative colitis and requires frequent colonoscopies  Fatigue/sleep:   She has daily fatigue .   She has some insomnia at times.     She has nocturia at night and sleep is variable.    Hallucinations:  Prior to the hospitalization, she would get occasional mild visual hallucinations. These are occurring more. When she was at her worse, the last week of December in the first couple days in January, she had more hallucinations and also had severe confusion.    Mood/Cognition     She has mild depression.  She is noting more anxiety.  She has mild cognitive issues.  MS History:  She presented with a Lhermite's syndrome in 1988 or 1989.   MRIs of the head and neck were performed. They showed lesions consistent with the diagnosis of multiple sclerosis. Lumbar puncture was not necessary. Over the next 15 years, she would get some exacerbations and have courses of steroids. However, a disease  modifying therapy was not prescribed. By 2004, she had difficulty with her gait and would often have to lean on somebody for support.  She started Copaxone that year and stayed on it for about one half years. She stopped due to a lot of itching. She then switched to Rebif. While on Copaxone Rebif she continues to have exacerbations and further difficulties with her gait. About 2012 she switched to [facility] and has been on that medicine since. She tolerates Aubagio well but is not sure how well it is working for her.   She was off Aubagio x 2-3 weeks in 2013 and felt she did worse.  Last MRI's about 3 years ago.    REVIEW OF SYSTEMS:  Constitutional: No fevers, chills, sweats, or change in appetite.   Has fatigue and poor slep Eyes: see above.   No eye pain Ear, nose and throat: No hearing loss, ear pain, nasal congestion, sore throat Cardiovascular: No chest pain, palpitations Respiratory:  No shortness of breath at rest or with exertion.   No wheezes GastrointestinaI: Has UC - doing well.   No nausea, vomiting, diarrhea, abdominal pain, fecal incontinence Genitourinary:  see above Musculoskeletal:  No neck pain, back pain Integumentary: No rash, pruritus, skin lesions Neurological: as above Psychiatric: as above. Endocrine: No palpitations, diaphoresis, change in appetite, change in weigh or increased thirst Hematologic/Lymphatic:  No anemia, purpura, petechiae. Allergic/Immunologic: No itchy/runny eyes, nasal congestion, recent allergic reactions, rashes  ALLERGIES: Allergies  Allergen Reactions  . Oxycodone-Acetaminophen Other (See Comments)    Other  . Penicillins Rash    Has patient had a PCN reaction causing immediate rash, facial/tongue/throat swelling, SOB or lightheadedness with hypotension: NO Has patient had a PCN reaction causing severe rash involving mucus membranes or skin necrosis: NO Has patient had a PCN reaction that required hospitalization NO Has patient had a PCN reaction occurring within the last 10 years:NO If all of the above answers are "NO", then may proceed with Cephalosporin use.    HOME MEDICATIONS: Outpatient Medications Prior to Visit  Medication Sig Dispense Refill  . acyclovir (ZOVIRAX) 400 MG tablet Take 400 mg by mouth daily. Take every day per patient  0  . b complex vitamins tablet Take 1 tablet by mouth daily.     . baclofen (LIORESAL) 10 MG tablet Take 1.5 tablets (15 mg total) by mouth at bedtime. 30 each 0  . baclofen (LIORESAL) 10 MG tablet Take 10 mg by mouth 3 (three) times daily.    . cholecalciferol (VITAMIN D) 1000 UNITS tablet Take 1,000 Units by mouth daily.     . dantrolene (DANTRIUM) 50 MG capsule Take 50 mg by mouth 3 (three)  times daily.    Marland Kitchen FLUoxetine (PROZAC) 40 MG capsule Take 1 capsule (40 mg total) by mouth daily. 90 capsule 3  . FOLIC ACID PO Take 025 mcg by mouth daily.     . furosemide (LASIX) 40 MG tablet Take 1 tablet (40 mg total) by mouth 2 (two) times daily. 30 tablet   . lamoTRIgine (LAMICTAL) 200 MG tablet Take 1 tablet (200 mg total) by mouth 3 (three) times daily. 270 tablet 3  . mesalamine (PENTASA) 500 MG CR capsule Take 1,000 mg by mouth 3 (three) times daily.     Marland Kitchen NUTRITIONAL SUPPLEMENT LIQD Take 120 mLs by mouth 2 (two) times daily. MedPass    . omeprazole (PRILOSEC) 40 MG capsule Take 40 mg by mouth 2 (two) times daily.     Marland Kitchen  OXcarbazepine (TRILEPTAL) 150 MG tablet Take 1 tablet (150 mg total) by mouth 3 (three) times daily. 270 tablet 0  . sodium chloride (OCEAN) 0.65 % SOLN nasal spray Place 1 spray into both nostrils as needed for congestion.  0  . solifenacin (VESICARE) 10 MG tablet Take 10 mg by mouth daily.     . Teriflunomide (AUBAGIO) 14 MG TABS Take 1 tablet by mouth daily. 84 tablet 4  . zinc oxide 20 % ointment Apply 1 application topically 3 (three) times daily. Apply to bilateral buttocks every shift as a preventative measure    . amitriptyline (ELAVIL) 75 MG tablet Take 1 tablet (75 mg total) by mouth at bedtime. 90 tablet 1  . acetaminophen (TYLENOL) 500 MG tablet Take 500 mg by mouth every 6 (six) hours as needed for mild pain or moderate pain.    Marland Kitchen aspirin 81 MG chewable tablet Chew 1 tablet (81 mg total) by mouth daily. (Patient not taking: Reported on 09/27/2016)    . atorvastatin (LIPITOR) 20 MG tablet Take 1 tablet (20 mg total) by mouth daily at 6 PM. (Patient not taking: Reported on 09/27/2016)    . bisoprolol (ZEBETA) 5 MG tablet Take 1 tablet (5 mg total) by mouth daily. (Patient not taking: Reported on 09/27/2016)    . docusate sodium (COLACE) 100 MG capsule Take 100 mg by mouth daily.    . polyethylene glycol (MIRALAX / GLYCOLAX) packet Take 17 g by mouth daily.  (Patient not taking: Reported on 09/27/2016) 14 each 0   No facility-administered medications prior to visit.     PAST MEDICAL HISTORY: Past Medical History:  Diagnosis Date  . Headache   . Hypertension   . Kidney stones   . Movement disorder   . Multiple sclerosis ([place])   . Neuropathy ([place])   . Ulcerative colitis ([place])   . Vision abnormalities     PAST SURGICAL HISTORY: Past Surgical History:  Procedure Laterality Date  . KIDNEY STONE SURGERY      FAMILY HISTORY: Family History  Problem Relation Age of Onset  . Dementia Mother   . Hypertension Father   . Hyperlipidemia Father   . Diabetes Father   . Heart disease Father     SOCIAL HISTORY:  Social History   Social History  . Marital status: Married    Spouse name: N/A  . Number of children: N/A  . Years of education: N/A   Occupational History  . Not on file.   Social History Main Topics  . Smoking status: Never Smoker  . Smokeless tobacco: Never Used  . Alcohol use No  . Drug use: No  . Sexual activity: Not on file   Other Topics Concern  . Not on file   Social History Narrative  . No narrative on file     PHYSICAL EXAM  There were no vitals filed for this visit.  There is no height or weight on file to calculate BMI.   General: The patient is well-developed and well-nourished and in no acute distress  Skin: Extremities are without rash.    Her right shoulder is moderately tender over subacromial bursa.     Neurologic Exam  Mental status: The patient is alert and oriented x 3 at the time of the examination. The patient has apparent normal recent and remote memory, with an apparently normal attention span and concentration ability.   Speech is normal.  Cranial nerves: Extraocular movements are full.     There is  good facial sensation to soft touch bilaterally.Facial strength is normal.  Trapezius and sternocleidomastoid strength is normal. No dysarthria is noted.  The tongue is midline, and  the patient has symmetric elevation of the soft palate. No obvious hearing deficits are noted.  Motor:  Muscle bulk is normal .  Tone is increased in left > right arms and legs. Strength is  0/5 in legs, in arms strength is 2-/5 in left arm and 2+ /5 in right arm (was 3/5) with extension > flexion weakness.    Sensory: Sensory testing is intact to pinprick, soft touch in arms and legs   She has decreased vibration sensation in legs  Coordination: Cerebellar testing is poor bilateral due to weakness.  Gait and station: She can not stand or walk.    Reflexes: Deep tendon reflexes show spread at the knees though jerks are poor.      DIAGNOSTIC DATA (LABS, IMAGING, TESTING) - I reviewed patient records, labs, notes, testing and imaging myself where available.  Lab Results  Component Value Date   WBC 7.4 08/24/2016   HGB 10.7 (A) 08/24/2016   HCT 34 (A) 08/24/2016   MCV 100.9 (H) 08/16/2016   PLT 170 08/24/2016      Component Value Date/Time   NA 142 08/24/2016   K 3.9 08/24/2016   CL 96 (L) 08/19/2016 0557   CO2 32 08/19/2016 0557   GLUCOSE 104 (H) 08/19/2016 0557   BUN 17 08/24/2016   CREATININE 1.0 08/24/2016   CREATININE 0.99 08/19/2016 0557   CALCIUM 9.9 08/19/2016 0557   PROT 6.0 (L) 08/04/2016 1731   PROT 6.4 11/09/2015 1351   ALBUMIN 3.3 (L) 08/04/2016 1731   ALBUMIN 4.0 11/09/2015 1351   AST 19 08/24/2016   ALT 16 08/24/2016   ALKPHOS 110 08/24/2016   BILITOT 0.6 08/04/2016 1731   BILITOT 0.4 11/09/2015 1351   GFRNONAA >60 08/19/2016 0557   GFRAA >60 08/19/2016 0557       ASSESSMENT AND PLAN  Multiple sclerosis (HCC) - Plan: Hepatitis B surface antigen, Hepatitis B core antibody, total, Hepatitis B surface antibody, Quantiferon tb gold assay (blood), CBC with Differential/Platelet  Spastic quadriparesis (HCC)  Other fatigue  Altered mental status, unspecified altered mental status type  Visual hallucination  Dysesthesia  High risk medication use -  Plan: Hepatitis B surface antigen, Hepatitis B core antibody, total, Hepatitis B surface antibody, Quantiferon tb gold assay (blood), CBC with Differential/Platelet   1.   Continue 200 mg lamotrigine mg tid for dysesthesia and baclofen/dantrolene for spasticity.   I am reluctant to do Botox in the right arm at this time this could make her weakness worse and not improved functionality. His spasticity worsens improvement of function in the next month, reconsider Botox 2.   We had a long discussion about disease modifying therapies. She is not doing too well on Aubagio and I recommended that she consider a switch to ocrelizumab.  We discussed the advantages and disadvantages. We will check blood work to rule out hepatitis B and TB. 3.   We will reduce the amitriptyline from 75 mg at night to 25 mg at night in the hope that that will reduce her hallucinations.  4.   She will return for follow-up in 3-4 months. Call sooner if  new or worsening neurologic symptoms she should call us sooner. buspar anxiety    50 minute face-to-face evaluation with greater than one half of time counseling or coordinating care about her MS and related  symptoms.  Richard A. Felecia Shelling, MD, PhD 9/98/0699, 9:67 PM Certified in Neurology, Clinical Neurophysiology, Sleep Medicine, Pain Medicine and Neuroimaging  [facility] [address] [place], [place] [address] [phone]

## 2016-09-28 ENCOUNTER — Telehealth: Payer: Self-pay | Admitting: *Deleted

## 2016-09-28 LAB — CBC WITH DIFFERENTIAL/PLATELET
BASOS: 1 %
Basophils Absolute: 0.1 10*3/uL (ref 0.0–0.2)
EOS (ABSOLUTE): 0.5 10*3/uL — AB (ref 0.0–0.4)
Eos: 5 %
Hematocrit: 32.6 % — ABNORMAL LOW (ref 34.0–46.6)
Hemoglobin: 10 g/dL — ABNORMAL LOW (ref 11.1–15.9)
IMMATURE GRANS (ABS): 0.1 10*3/uL (ref 0.0–0.1)
Immature Granulocytes: 1 %
LYMPHS ABS: 1.7 10*3/uL (ref 0.7–3.1)
LYMPHS: 16 %
MCH: 30.3 pg (ref 26.6–33.0)
MCHC: 30.7 g/dL — ABNORMAL LOW (ref 31.5–35.7)
MCV: 99 fL — AB (ref 79–97)
MONOS ABS: 1 10*3/uL — AB (ref 0.1–0.9)
Monocytes: 9 %
NEUTROS ABS: 7.1 10*3/uL — AB (ref 1.4–7.0)
Neutrophils: 68 %
PLATELETS: 232 10*3/uL (ref 150–379)
RBC: 3.3 x10E6/uL — ABNORMAL LOW (ref 3.77–5.28)
RDW: 18 % — ABNORMAL HIGH (ref 12.3–15.4)
WBC: 10.3 10*3/uL (ref 3.4–10.8)

## 2016-09-28 LAB — HEPATITIS B CORE ANTIBODY, TOTAL: HEP B C TOTAL AB: NEGATIVE

## 2016-09-28 LAB — HEPATITIS B SURFACE ANTIGEN: HEP B S AG: NEGATIVE

## 2016-09-28 LAB — HEPATITIS B SURFACE ANTIBODY,QUALITATIVE: Hep B Surface Ab, Qual: NONREACTIVE

## 2016-09-28 NOTE — Telephone Encounter (Signed)
-----   Message from Britt Bottom, MD sent at 09/28/2016  1:05 PM EST ----- Labs are fine, we can send in the [place] form

## 2016-09-28 NOTE — Telephone Encounter (Signed)
I have spoken with Vanessa Short this afternoon, and per RAS, explained that labs done in our office look good--she can start Ocrelizumab.  She sts. she has not had a chance to look over info on Ocrelizumab, will call if she would like to start it./fim

## 2016-09-29 ENCOUNTER — Inpatient Hospital Stay (HOSPITAL_COMMUNITY)
Admission: EM | Admit: 2016-09-29 | Discharge: 2016-10-02 | DRG: 193 | Disposition: A | Discharge status: Home Health | Payer: Commercial Managed Care - PPO | Attending: Internal Medicine | Admitting: Internal Medicine

## 2016-09-29 ENCOUNTER — Emergency Department (HOSPITAL_COMMUNITY): Payer: Commercial Managed Care - PPO

## 2016-09-29 ENCOUNTER — Encounter (HOSPITAL_COMMUNITY): Payer: Self-pay

## 2016-09-29 DIAGNOSIS — J189 Pneumonia, unspecified organism: Secondary | ICD-10-CM | POA: Diagnosis not present

## 2016-09-29 DIAGNOSIS — D638 Anemia in other chronic diseases classified elsewhere: Secondary | ICD-10-CM | POA: Diagnosis present

## 2016-09-29 DIAGNOSIS — E669 Obesity, unspecified: Secondary | ICD-10-CM | POA: Diagnosis present

## 2016-09-29 DIAGNOSIS — G8250 Quadriplegia, unspecified: Secondary | ICD-10-CM | POA: Diagnosis present

## 2016-09-29 DIAGNOSIS — J181 Lobar pneumonia, unspecified organism: Secondary | ICD-10-CM | POA: Diagnosis not present

## 2016-09-29 DIAGNOSIS — I110 Hypertensive heart disease with heart failure: Secondary | ICD-10-CM | POA: Diagnosis present

## 2016-09-29 DIAGNOSIS — I5032 Chronic diastolic (congestive) heart failure: Secondary | ICD-10-CM | POA: Diagnosis present

## 2016-09-29 DIAGNOSIS — R9431 Abnormal electrocardiogram [ECG] [EKG]: Secondary | ICD-10-CM | POA: Diagnosis not present

## 2016-09-29 DIAGNOSIS — Y95 Nosocomial condition: Secondary | ICD-10-CM | POA: Diagnosis present

## 2016-09-29 DIAGNOSIS — Z885 Allergy status to narcotic agent status: Secondary | ICD-10-CM

## 2016-09-29 DIAGNOSIS — Z7982 Long term (current) use of aspirin: Secondary | ICD-10-CM

## 2016-09-29 DIAGNOSIS — G629 Polyneuropathy, unspecified: Secondary | ICD-10-CM | POA: Diagnosis present

## 2016-09-29 DIAGNOSIS — B9689 Other specified bacterial agents as the cause of diseases classified elsewhere: Secondary | ICD-10-CM | POA: Diagnosis present

## 2016-09-29 DIAGNOSIS — D649 Anemia, unspecified: Secondary | ICD-10-CM | POA: Diagnosis present

## 2016-09-29 DIAGNOSIS — Z880 Allergy status to penicillin: Secondary | ICD-10-CM | POA: Diagnosis not present

## 2016-09-29 DIAGNOSIS — G35 Multiple sclerosis: Secondary | ICD-10-CM | POA: Diagnosis present

## 2016-09-29 DIAGNOSIS — R7881 Bacteremia: Secondary | ICD-10-CM | POA: Diagnosis present

## 2016-09-29 DIAGNOSIS — Z993 Dependence on wheelchair: Secondary | ICD-10-CM | POA: Diagnosis not present

## 2016-09-29 DIAGNOSIS — I10 Essential (primary) hypertension: Secondary | ICD-10-CM | POA: Diagnosis not present

## 2016-09-29 DIAGNOSIS — Z6832 Body mass index (BMI) 32.0-32.9, adult: Secondary | ICD-10-CM

## 2016-09-29 DIAGNOSIS — R509 Fever, unspecified: Secondary | ICD-10-CM | POA: Diagnosis present

## 2016-09-29 DIAGNOSIS — R6510 Systemic inflammatory response syndrome (SIRS) of non-infectious origin without acute organ dysfunction: Secondary | ICD-10-CM | POA: Diagnosis present

## 2016-09-29 DIAGNOSIS — E876 Hypokalemia: Secondary | ICD-10-CM | POA: Diagnosis present

## 2016-09-29 DIAGNOSIS — K5190 Ulcerative colitis, unspecified, without complications: Secondary | ICD-10-CM | POA: Diagnosis present

## 2016-09-29 DIAGNOSIS — F332 Major depressive disorder, recurrent severe without psychotic features: Secondary | ICD-10-CM | POA: Diagnosis present

## 2016-09-29 LAB — COMPREHENSIVE METABOLIC PANEL
ALT: 22 U/L (ref 14–54)
AST: 22 U/L (ref 15–41)
Albumin: 3.4 g/dL — ABNORMAL LOW (ref 3.5–5.0)
Alkaline Phosphatase: 84 U/L (ref 38–126)
Anion gap: 13 (ref 5–15)
BUN: 14 mg/dL (ref 6–20)
CHLORIDE: 99 mmol/L — AB (ref 101–111)
CO2: 25 mmol/L (ref 22–32)
CREATININE: 1 mg/dL (ref 0.44–1.00)
Calcium: 8.8 mg/dL — ABNORMAL LOW (ref 8.9–10.3)
GFR calc non Af Amer: >60 mL/min (ref >60)
Glucose, Bld: 148 mg/dL — ABNORMAL HIGH (ref 65–99)
Potassium: 2.5 mmol/L — CL (ref 3.5–5.1)
SODIUM: 137 mmol/L (ref 135–145)
Total Bilirubin: 0.9 mg/dL (ref 0.3–1.2)
Total Protein: 6.2 g/dL — ABNORMAL LOW (ref 6.5–8.1)

## 2016-09-29 LAB — CBC WITH DIFFERENTIAL/PLATELET
BASOS PCT: 0 %
Basophils Absolute: 0 10*3/uL (ref 0.0–0.1)
EOS PCT: 0 %
Eosinophils Absolute: 0 10*3/uL (ref 0.0–0.7)
HCT: 32.1 % — ABNORMAL LOW (ref 36.0–46.0)
Hemoglobin: 10.1 g/dL — ABNORMAL LOW (ref 12.0–15.0)
Lymphocytes Relative: 8 %
Lymphs Abs: 1.6 10*3/uL (ref 0.7–4.0)
MCH: 31.4 pg (ref 26.0–34.0)
MCHC: 31.5 g/dL (ref 30.0–36.0)
MCV: 99.7 fL (ref 78.0–100.0)
MONO ABS: 1.4 10*3/uL — AB (ref 0.1–1.0)
MONOS PCT: 7 %
NEUTROS PCT: 85 %
Neutro Abs: 16.8 10*3/uL — ABNORMAL HIGH (ref 1.7–7.7)
Platelets: 196 10*3/uL (ref 150–400)
RBC: 3.22 MIL/uL — ABNORMAL LOW (ref 3.87–5.11)
RDW: 17.6 % — AB (ref 11.5–15.5)
WBC: 19.8 10*3/uL — ABNORMAL HIGH (ref 4.0–10.5)

## 2016-09-29 LAB — I-STAT CG4 LACTIC ACID, ED: LACTIC ACID, VENOUS: 1.49 mmol/L (ref 0.5–1.9)

## 2016-09-29 LAB — PROTIME-INR
INR: 0.98
Prothrombin Time: 13 seconds (ref 11.4–15.2)

## 2016-09-29 MED ORDER — POTASSIUM CHLORIDE CRYS ER 20 MEQ PO TBCR
40.0000 meq | EXTENDED_RELEASE_TABLET | Freq: Once | ORAL | Status: AC
  Administered 2016-09-29: 40 meq via ORAL
  Filled 2016-09-29: qty 2

## 2016-09-29 MED ORDER — SODIUM CHLORIDE 0.9 % IV SOLN
30.0000 meq | Freq: Once | INTRAVENOUS | Status: AC
  Administered 2016-09-30: 30 meq via INTRAVENOUS
  Filled 2016-09-29 (×2): qty 15

## 2016-09-29 MED ORDER — DEXTROSE 5 % IV SOLN
2.0000 g | Freq: Once | INTRAVENOUS | Status: AC
  Administered 2016-09-30: 2 g via INTRAVENOUS
  Filled 2016-09-29: qty 2

## 2016-09-29 MED ORDER — MAGNESIUM SULFATE 2 GM/50ML IV SOLN
2.0000 g | Freq: Once | INTRAVENOUS | Status: AC
  Administered 2016-09-30: 2 g via INTRAVENOUS
  Filled 2016-09-29: qty 50

## 2016-09-29 MED ORDER — SODIUM CHLORIDE 0.9 % IV SOLN
INTRAVENOUS | Status: DC
  Administered 2016-09-30: via INTRAVENOUS

## 2016-09-29 MED ORDER — OSELTAMIVIR PHOSPHATE 75 MG PO CAPS
75.0000 mg | ORAL_CAPSULE | Freq: Two times a day (BID) | ORAL | Status: DC
  Administered 2016-09-30 (×2): 75 mg via ORAL
  Filled 2016-09-29 (×2): qty 1

## 2016-09-29 MED ORDER — VANCOMYCIN HCL IN DEXTROSE 1-5 GM/200ML-% IV SOLN
1000.0000 mg | Freq: Once | INTRAVENOUS | Status: AC
  Administered 2016-09-30: 1000 mg via INTRAVENOUS
  Filled 2016-09-29: qty 200

## 2016-09-29 MED ORDER — ACETAMINOPHEN 325 MG PO TABS
ORAL_TABLET | ORAL | Status: AC
  Administered 2016-09-29: 650 mg
  Filled 2016-09-29: qty 2

## 2016-09-29 NOTE — ED Notes (Signed)
ED Provider at bedside. 

## 2016-09-29 NOTE — ED Triage Notes (Addendum)
Pt endorses fevers between 101-103 that began this afternoon with congestion. Pt took 644m ibuprofen at 1630. Oral temp in triage 99.8. Pt has MS and is in a wheelchair. Pt had pneumonia in December and was here for 3 weeks.

## 2016-09-29 NOTE — ED Provider Notes (Signed)
By signing my name below, I, Macon Large, attest that this documentation has been prepared under the direction and in the presence of Lutcher, DO. Electronically Signed: Macon Large, ED Scribe. 09/29/16. 11:41 PM.   TIME SEEN: 11:22 PM  CHIEF COMPLAINT: Fever   HPI: HPI Comments: Cherrell Diefendorf is a 51 y.o. female with PMHx of multiple sclerosis, HTN who presents to the Emergency Department complaining of moderate, intermittent, fever onset earlier this afternoon. Pt was last seen in the ED for similar symptoms, she tested negative for the flu, was positive for metapneumovirus and was diagnosed with CAP. Pt was given Ceftriaxone and azithromycin. Was discharge to rehab and is now back at home.  She reports associated dizziness today, fever, cough, nasal congestion. Per pt's spouse, she had a reported Tmax fever of 102.8 (axillary) at home.  She reports taking two ibuprofen at home administered by her nurse aid with minimal relief. Her spouse also notes giving pt Mucinex with no significant relief. Husband reports seeing a "cloudy" urine output at home. Recently treated for UTI but is no longer on antibiotics. Spouse states pt had low sats at home in the upper 80s on RA by their pulse oximeter but is unsure if the machine was picking up correctly as it was measure heart rate in the 40s.  Pt denies oxygen use at home. States that she feels that her MS is at its baseline since her last discharge from the hospital. Reports she has not been able to push herself in a wheelchair since her previous discharge. No new numbness or weakness. Per pt, she denies recent steroid use and hx of PE or DVT. Pt states she is seen at [facility] for her PCP.   ROS: See HPI Constitutional: fever, cough  Eyes: no drainage  ENT:  runny nose   Cardiovascular:  no chest pain  Resp: no SOB  GI: no vomiting GU: no dysuria Integumentary: no rash  Allergy: no hives  Musculoskeletal: no leg  swelling, generalized muscles aches  Neurological:  ROS otherwise negative  PAST MEDICAL HISTORY/PAST SURGICAL HISTORY:  Past Medical History:  Diagnosis Date  . Headache   . Hypertension   . Kidney stones   . Movement disorder   . Multiple sclerosis ([place])   . Neuropathy ([place])   . Ulcerative colitis ([place])   . Vision abnormalities     MEDICATIONS:  Prior to Admission medications   Medication Sig Start Date End Date Taking? Authorizing Provider  acetaminophen (TYLENOL) 500 MG tablet Take 500 mg by mouth every 6 (six) hours as needed for mild pain or moderate pain.    Historical Provider, MD  acyclovir (ZOVIRAX) 400 MG tablet Take 400 mg by mouth daily. Take every day per patient 08/11/14   Historical Provider, MD  amitriptyline (ELAVIL) 25 MG tablet Take 1 tablet (25 mg total) by mouth at bedtime. 09/27/16   Britt Bottom, MD  aspirin 81 MG chewable tablet Chew 1 tablet (81 mg total) by mouth daily. Patient not taking: Reported on 09/27/2016 08/19/16   Ripudeep Krystal Eaton, MD  atorvastatin (LIPITOR) 20 MG tablet Take 1 tablet (20 mg total) by mouth daily at 6 PM. Patient not taking: Reported on 09/27/2016 08/19/16   Ripudeep Krystal Eaton, MD  b complex vitamins tablet Take 1 tablet by mouth daily.     Historical Provider, MD  baclofen (LIORESAL) 10 MG tablet Take 1.5 tablets (15 mg total) by mouth at bedtime. 08/19/16   Ripudeep  Krystal Eaton, MD  baclofen (LIORESAL) 10 MG tablet Take 10 mg by mouth 3 (three) times daily.    Historical Provider, MD  bisoprolol (ZEBETA) 5 MG tablet Take 1 tablet (5 mg total) by mouth daily. Patient not taking: Reported on 09/27/2016 08/19/16   Ripudeep Krystal Eaton, MD  busPIRone (BUSPAR) 15 MG tablet Take 1 tablet (15 mg total) by mouth 2 (two) times daily. 09/27/16   Britt Bottom, MD  cholecalciferol (VITAMIN D) 1000 UNITS tablet Take 1,000 Units by mouth daily.     Historical Provider, MD  dantrolene (DANTRIUM) 50 MG capsule Take 50 mg by mouth 3 (three) times daily.    Historical  Provider, MD  docusate sodium (COLACE) 100 MG capsule Take 100 mg by mouth daily.    Historical Provider, MD  FLUoxetine (PROZAC) 40 MG capsule Take 1 capsule (40 mg total) by mouth daily. 09/07/14   Britt Bottom, MD  FOLIC ACID PO Take 240 mcg by mouth daily.     Historical Provider, MD  furosemide (LASIX) 40 MG tablet Take 1 tablet (40 mg total) by mouth 2 (two) times daily. 08/19/16   Ripudeep Krystal Eaton, MD  lamoTRIgine (LAMICTAL) 200 MG tablet Take 1 tablet (200 mg total) by mouth 3 (three) times daily. 05/09/16   Britt Bottom, MD  mesalamine (PENTASA) 500 MG CR capsule Take 1,000 mg by mouth 3 (three) times daily.     Historical Provider, MD  NUTRITIONAL SUPPLEMENT LIQD Take 120 mLs by mouth 2 (two) times daily. [employer]    Historical Provider, MD  omeprazole (PRILOSEC) 40 MG capsule Take 40 mg by mouth 2 (two) times daily.     Historical Provider, MD  OXcarbazepine (TRILEPTAL) 150 MG tablet Take 1 tablet (150 mg total) by mouth 3 (three) times daily. 06/01/16   Britt Bottom, MD  polyethylene glycol (MIRALAX / GLYCOLAX) packet Take 17 g by mouth daily. Patient not taking: Reported on 09/27/2016 08/19/16   Ripudeep Krystal Eaton, MD  sodium chloride (OCEAN) 0.65 % SOLN nasal spray Place 1 spray into both nostrils as needed for congestion. 08/19/16   Ripudeep Krystal Eaton, MD  solifenacin (VESICARE) 10 MG tablet Take 10 mg by mouth daily.     Historical Provider, MD  Teriflunomide (AUBAGIO) 14 MG TABS Take 1 tablet by mouth daily. 03/14/16   Britt Bottom, MD  zinc oxide 20 % ointment Apply 1 application topically 3 (three) times daily. Apply to bilateral buttocks every shift as a preventative measure    Historical Provider, MD    ALLERGIES:  Allergies  Allergen Reactions  . Oxycodone-Acetaminophen Other (See Comments)    Other  . Penicillins Rash    Has patient had a PCN reaction causing immediate rash, facial/tongue/throat swelling, SOB or lightheadedness with hypotension: NO Has patient had a PCN reaction  causing severe rash involving mucus membranes or skin necrosis: NO Has patient had a PCN reaction that required hospitalization NO Has patient had a PCN reaction occurring within the last 10 years:NO If all of the above answers are "NO", then may proceed with Cephalosporin use.    SOCIAL HISTORY:  Social History  Substance Use Topics  . Smoking status: Never Smoker  . Smokeless tobacco: Never Used  . Alcohol use No    FAMILY HISTORY: Family History  Problem Relation Age of Onset  . Dementia Mother   . Hypertension Father   . Hyperlipidemia Father   . Diabetes Father   . Heart disease Father  EXAM: BP 114/70   Pulse 102   Temp 100.6 F (38.1 C) (Axillary) Comment: Pt drank water in last 15 minutes  Resp 22   Ht 5' 2"  (1.575 m)   Wt 175 lb (79.4 kg)   SpO2 95%   BMI 32.01 kg/m  CONSTITUTIONAL: Alert and oriented and responds appropriately to questions.; well-nourished, Febrile but nontoxic appearing HEAD: Normocephalic EYES: Conjunctivae clear, PERRL, EOMI ENT: normal nose; no rhinorrhea; moist mucous membranes NECK: Supple, no meningismus, no nuchal rigidity, no LAD  CARD: Regular and mildly tachycardiac; S1 and S2 appreciated; no murmurs, no clicks, no rubs, no gallops RESP: Normal chest excursion without splinting or tachypnea; breath sounds clear and equal bilaterally; no wheezes, no rhonchi, no rales, no hypoxia or respiratory distress, speaking full sentences ABD/GI: Normal bowel sounds; non-distended; soft, non-tender, no rebound, no guarding, no peritoneal signs, no hepatosplenomegaly BACK:  The back appears normal and is non-tender to palpation, there is no CVA tenderness EXT: Normal ROM in all joints; non-tender to palpation; no edema; normal capillary refill; no cyanosis, no calf tenderness or swelling    SKIN: Normal color for age and race; warm; no rash NEURO: Normal movement of upper extremities, no movement of bilateral lower extremities, which is  baseline. Normal speech, no facial droop.  PSYCH: The patient's mood and manner are appropriate. Grooming and personal hygiene are appropriate.  MEDICAL DECISION MAKING: Patient here with symptoms of pneumonia, possible influenza. Chest x-ray obtained in triage shows possible new left lower lobe infiltrate. Does have a leukocytosis of 20,000 and is febrile, minimally tachycardic but otherwise hemodynamically stable. Recent admission in the last 90 days therefore will treat for healthcare associated pneumonia. Lactate is normal. Will give broad-spectrum antibody, IV fluids. Given cloudy urine, we'll also obtain urinalysis and urine culture. Potassium here is 2.5 with prolonged QT interval on her EKG. She is not having any chest pain currently, palpitations but did feel dizzy earlier today. Will replace with IV and oral potassium and keep her on a cardiac monitor. I recommend admission to the hospital. Patient is comfortable with this plan. Husband does report that during her last episode of pneumonia she got very sick very quickly and had an MS flare. No new neurologic deficits today from her last admission.  ED PROGRESS: 11:50 PM  D/w Dr. Myna Hidalgo With hospitalist service. He will see patient in the emergency department and place admission orders. We appreciate his help. Patient and wife updated with this plan.  I reviewed all nursing notes, vitals, pertinent old records, EKGs, labs, imaging (as available).    EKG Interpretation  Date/Time:  Friday September 29 2016 23:13:35 EST Ventricular Rate:  102 PR Interval:    QRS Duration: 91 QT Interval:  392 QTC Calculation: 511 R Axis:   15 Text Interpretation:  Sinus tachycardia Low voltage, precordial leads Probable anteroseptal infarct, old Nonspecific repol abnormality, diffuse leads Prolonged QT interval that is new compard to prior Confirmed by Delise Simenson,  DO, Maral Lampe ([id number]) on 09/29/2016 11:29:25 PM        CRITICAL CARE Performed by: Nyra Jabs   Total critical care time: 35 minutes  Critical care time was exclusive of separately billable procedures and treating other patients.  Critical care was necessary to treat or prevent imminent or life-threatening deterioration.  Critical care was time spent personally by me on the following activities: development of treatment plan with patient and/or surrogate as well as nursing, discussions with consultants, evaluation of patient's response to treatment,  examination of patient, obtaining history from patient or surrogate, ordering and performing treatments and interventions, ordering and review of laboratory studies, ordering and review of radiographic studies, pulse oximetry and re-evaluation of patient's condition.   I personally performed the services described in this documentation, which was scribed in my presence. The recorded information has been reviewed and is accurate.     Gardner, DO 09/30/16 0007

## 2016-09-30 DIAGNOSIS — R9431 Abnormal electrocardiogram [ECG] [EKG]: Secondary | ICD-10-CM

## 2016-09-30 DIAGNOSIS — I5032 Chronic diastolic (congestive) heart failure: Secondary | ICD-10-CM | POA: Diagnosis present

## 2016-09-30 LAB — BLOOD CULTURE ID PANEL (REFLEXED)
ACINETOBACTER BAUMANNII: NOT DETECTED
CANDIDA ALBICANS: NOT DETECTED
CANDIDA GLABRATA: NOT DETECTED
CANDIDA KRUSEI: NOT DETECTED
Candida parapsilosis: NOT DETECTED
Candida tropicalis: NOT DETECTED
Carbapenem resistance: NOT DETECTED
ENTEROCOCCUS SPECIES: NOT DETECTED
ESCHERICHIA COLI: NOT DETECTED
Enterobacter cloacae complex: NOT DETECTED
Enterobacteriaceae species: DETECTED — AB
Haemophilus influenzae: NOT DETECTED
KLEBSIELLA OXYTOCA: NOT DETECTED
Klebsiella pneumoniae: NOT DETECTED
LISTERIA MONOCYTOGENES: NOT DETECTED
Neisseria meningitidis: NOT DETECTED
PROTEUS SPECIES: NOT DETECTED
Pseudomonas aeruginosa: NOT DETECTED
SERRATIA MARCESCENS: NOT DETECTED
STAPHYLOCOCCUS SPECIES: NOT DETECTED
STREPTOCOCCUS PYOGENES: NOT DETECTED
Staphylococcus aureus (BCID): NOT DETECTED
Streptococcus agalactiae: NOT DETECTED
Streptococcus pneumoniae: NOT DETECTED
Streptococcus species: NOT DETECTED

## 2016-09-30 LAB — BASIC METABOLIC PANEL
Anion gap: 10 (ref 5–15)
BUN: 17 mg/dL (ref 6–20)
CHLORIDE: 103 mmol/L (ref 101–111)
CO2: 27 mmol/L (ref 22–32)
Calcium: 8.5 mg/dL — ABNORMAL LOW (ref 8.9–10.3)
Creatinine, Ser: 1.03 mg/dL — ABNORMAL HIGH (ref 0.44–1.00)
GFR calc Af Amer: >60 mL/min (ref >60)
GFR calc non Af Amer: >60 mL/min (ref >60)
GLUCOSE: 134 mg/dL — AB (ref 65–99)
Potassium: 2.5 mmol/L — CL (ref 3.5–5.1)
Sodium: 140 mmol/L (ref 135–145)

## 2016-09-30 LAB — CBC WITH DIFFERENTIAL/PLATELET
Basophils Absolute: 0 10*3/uL (ref 0.0–0.1)
Basophils Relative: 0 %
Eosinophils Absolute: 0.1 10*3/uL (ref 0.0–0.7)
Eosinophils Relative: 1 %
HCT: 26.9 % — ABNORMAL LOW (ref 36.0–46.0)
HEMOGLOBIN: 8.4 g/dL — AB (ref 12.0–15.0)
LYMPHS ABS: 1.4 10*3/uL (ref 0.7–4.0)
LYMPHS PCT: 10 %
MCH: 31.5 pg (ref 26.0–34.0)
MCHC: 31.2 g/dL (ref 30.0–36.0)
MCV: 100.7 fL — AB (ref 78.0–100.0)
Monocytes Absolute: 2 10*3/uL — ABNORMAL HIGH (ref 0.1–1.0)
Monocytes Relative: 15 %
NEUTROS PCT: 74 %
Neutro Abs: 9.8 10*3/uL — ABNORMAL HIGH (ref 1.7–7.7)
Platelets: 144 10*3/uL — ABNORMAL LOW (ref 150–400)
RBC: 2.67 MIL/uL — AB (ref 3.87–5.11)
RDW: 18.2 % — ABNORMAL HIGH (ref 11.5–15.5)
WBC: 13.3 10*3/uL — AB (ref 4.0–10.5)

## 2016-09-30 LAB — GLUCOSE, CAPILLARY: GLUCOSE-CAPILLARY: 141 mg/dL — AB (ref 65–99)

## 2016-09-30 LAB — MAGNESIUM: Magnesium: 2.7 mg/dL — ABNORMAL HIGH (ref 1.7–2.4)

## 2016-09-30 LAB — RESPIRATORY PANEL BY PCR
Adenovirus: NOT DETECTED
BORDETELLA PERTUSSIS-RVPCR: NOT DETECTED
CORONAVIRUS HKU1-RVPPCR: NOT DETECTED
Chlamydophila pneumoniae: NOT DETECTED
Coronavirus 229E: NOT DETECTED
Coronavirus NL63: NOT DETECTED
Coronavirus OC43: NOT DETECTED
INFLUENZA B-RVPPCR: NOT DETECTED
Influenza A: NOT DETECTED
METAPNEUMOVIRUS-RVPPCR: NOT DETECTED
Mycoplasma pneumoniae: NOT DETECTED
PARAINFLUENZA VIRUS 2-RVPPCR: NOT DETECTED
PARAINFLUENZA VIRUS 3-RVPPCR: NOT DETECTED
Parainfluenza Virus 1: NOT DETECTED
Parainfluenza Virus 4: NOT DETECTED
RESPIRATORY SYNCYTIAL VIRUS-RVPPCR: NOT DETECTED
RHINOVIRUS / ENTEROVIRUS - RVPPCR: NOT DETECTED

## 2016-09-30 LAB — HIV ANTIBODY (ROUTINE TESTING W REFLEX): HIV Screen 4th Generation wRfx: NONREACTIVE

## 2016-09-30 MED ORDER — ENOXAPARIN SODIUM 40 MG/0.4ML ~~LOC~~ SOLN
40.0000 mg | Freq: Every day | SUBCUTANEOUS | Status: DC
  Administered 2016-09-30 – 2016-10-02 (×3): 40 mg via SUBCUTANEOUS
  Filled 2016-09-30 (×3): qty 0.4

## 2016-09-30 MED ORDER — VANCOMYCIN HCL IN DEXTROSE 1-5 GM/200ML-% IV SOLN
1000.0000 mg | Freq: Two times a day (BID) | INTRAVENOUS | Status: DC
  Administered 2016-09-30: 1000 mg via INTRAVENOUS
  Filled 2016-09-30: qty 200

## 2016-09-30 MED ORDER — FOLIC ACID 1 MG PO TABS
1.0000 mg | ORAL_TABLET | Freq: Every day | ORAL | Status: DC
  Administered 2016-09-30 – 2016-10-02 (×3): 1 mg via ORAL
  Filled 2016-09-30 (×3): qty 1

## 2016-09-30 MED ORDER — SODIUM CHLORIDE 0.9% FLUSH: 3.0000 mL | INTRAVENOUS | Status: DC | PRN

## 2016-09-30 MED ORDER — SODIUM CHLORIDE 0.9 % IV SOLN: 250.0000 mL | INTRAVENOUS | Status: DC | PRN

## 2016-09-30 MED ORDER — PANTOPRAZOLE SODIUM 40 MG PO TBEC
40.0000 mg | DELAYED_RELEASE_TABLET | Freq: Every day | ORAL | Status: DC
  Administered 2016-09-30 – 2016-10-02 (×3): 40 mg via ORAL
  Filled 2016-09-30 (×3): qty 1

## 2016-09-30 MED ORDER — DEXTROSE 5 % IV SOLN
1.0000 g | Freq: Three times a day (TID) | INTRAVENOUS | Status: DC
  Administered 2016-09-30 – 2016-10-02 (×7): 1 g via INTRAVENOUS
  Filled 2016-09-30 (×9): qty 1

## 2016-09-30 MED ORDER — BUSPIRONE HCL 15 MG PO TABS
15.0000 mg | ORAL_TABLET | Freq: Two times a day (BID) | ORAL | Status: DC
  Administered 2016-09-30 – 2016-10-02 (×6): 15 mg via ORAL
  Filled 2016-09-30 (×6): qty 1

## 2016-09-30 MED ORDER — ACETAMINOPHEN 325 MG PO TABS: 650.0000 mg | ORAL_TABLET | Freq: Four times a day (QID) | ORAL | Status: DC | PRN

## 2016-09-30 MED ORDER — OXYMETAZOLINE HCL 0.05 % NA SOLN
1.0000 | Freq: Two times a day (BID) | NASAL | Status: DC
  Administered 2016-09-30 – 2016-10-02 (×5): 1 via NASAL
  Filled 2016-09-30: qty 15

## 2016-09-30 MED ORDER — FENTANYL CITRATE (PF) 100 MCG/2ML IJ SOLN: 12.5000 ug | INTRAMUSCULAR | Status: DC | PRN

## 2016-09-30 MED ORDER — MESALAMINE ER 250 MG PO CPCR
1000.0000 mg | ORAL_CAPSULE | Freq: Three times a day (TID) | ORAL | Status: DC
  Administered 2016-09-30 – 2016-10-02 (×7): 1000 mg via ORAL
  Filled 2016-09-30 (×10): qty 4

## 2016-09-30 MED ORDER — LORATADINE 10 MG PO TABS
10.0000 mg | ORAL_TABLET | Freq: Every day | ORAL | Status: DC
  Administered 2016-09-30 – 2016-10-02 (×3): 10 mg via ORAL
  Filled 2016-09-30 (×3): qty 1

## 2016-09-30 MED ORDER — DOCUSATE SODIUM 100 MG PO CAPS
100.0000 mg | ORAL_CAPSULE | Freq: Every day | ORAL | Status: DC
  Administered 2016-09-30 – 2016-10-01 (×2): 100 mg via ORAL
  Filled 2016-09-30 (×3): qty 1

## 2016-09-30 MED ORDER — LAMOTRIGINE 100 MG PO TABS
200.0000 mg | ORAL_TABLET | Freq: Three times a day (TID) | ORAL | Status: DC
  Administered 2016-09-30 – 2016-10-02 (×8): 200 mg via ORAL
  Filled 2016-09-30 (×7): qty 2

## 2016-09-30 MED ORDER — FLUTICASONE PROPIONATE 50 MCG/ACT NA SUSP
2.0000 | Freq: Every day | NASAL | Status: DC
  Administered 2016-09-30 – 2016-10-02 (×3): 2 via NASAL
  Filled 2016-09-30: qty 16

## 2016-09-30 MED ORDER — SODIUM CHLORIDE 0.9 % IV SOLN
30.0000 meq | Freq: Once | INTRAVENOUS | Status: AC
  Administered 2016-09-30: 30 meq via INTRAVENOUS
  Filled 2016-09-30: qty 15

## 2016-09-30 MED ORDER — DANTROLENE SODIUM 25 MG PO CAPS
50.0000 mg | ORAL_CAPSULE | Freq: Three times a day (TID) | ORAL | Status: DC
  Administered 2016-09-30 – 2016-10-02 (×7): 50 mg via ORAL
  Filled 2016-09-30 (×9): qty 2

## 2016-09-30 MED ORDER — BOOST / RESOURCE BREEZE PO LIQD
240.0000 mL | Freq: Two times a day (BID) | ORAL | Status: DC
  Administered 2016-09-30 – 2016-10-02 (×5): 1 via ORAL

## 2016-09-30 MED ORDER — ACYCLOVIR 400 MG PO TABS
400.0000 mg | ORAL_TABLET | Freq: Every day | ORAL | Status: DC
  Administered 2016-09-30 – 2016-10-02 (×3): 400 mg via ORAL
  Filled 2016-09-30 (×3): qty 1

## 2016-09-30 MED ORDER — BACLOFEN 10 MG PO TABS
15.0000 mg | ORAL_TABLET | Freq: Every day | ORAL | Status: DC
Start: 2016-09-30 — End: 2016-10-02
  Administered 2016-09-30 – 2016-10-01 (×3): 15 mg via ORAL
  Filled 2016-09-30 (×3): qty 2

## 2016-09-30 MED ORDER — BACLOFEN 10 MG PO TABS
10.0000 mg | ORAL_TABLET | Freq: Three times a day (TID) | ORAL | Status: DC
  Administered 2016-09-30 – 2016-10-02 (×7): 10 mg via ORAL
  Filled 2016-09-30 (×7): qty 1

## 2016-09-30 MED ORDER — GUAIFENESIN ER 600 MG PO TB12
600.0000 mg | ORAL_TABLET | Freq: Two times a day (BID) | ORAL | Status: DC
  Administered 2016-09-30 – 2016-10-02 (×6): 600 mg via ORAL
  Filled 2016-09-30 (×6): qty 1

## 2016-09-30 MED ORDER — POTASSIUM CHLORIDE CRYS ER 20 MEQ PO TBCR
40.0000 meq | EXTENDED_RELEASE_TABLET | Freq: Once | ORAL | Status: AC
  Administered 2016-09-30: 40 meq via ORAL
  Filled 2016-09-30: qty 2

## 2016-09-30 MED ORDER — SODIUM CHLORIDE 0.9% FLUSH
3.0000 mL | Freq: Two times a day (BID) | INTRAVENOUS | Status: DC
  Administered 2016-09-30: 3 mL via INTRAVENOUS

## 2016-09-30 MED ORDER — VITAMIN D 1000 UNITS PO TABS
1000.0000 [IU] | ORAL_TABLET | Freq: Every day | ORAL | Status: DC
  Administered 2016-09-30 – 2016-10-02 (×3): 1000 [IU] via ORAL
  Filled 2016-09-30 (×3): qty 1

## 2016-09-30 MED ORDER — DARIFENACIN HYDROBROMIDE ER 15 MG PO TB24
15.0000 mg | ORAL_TABLET | Freq: Every day | ORAL | Status: DC
  Administered 2016-09-30 – 2016-10-02 (×3): 15 mg via ORAL
  Filled 2016-09-30 (×3): qty 1

## 2016-09-30 MED ORDER — OXCARBAZEPINE 300 MG PO TABS
150.0000 mg | ORAL_TABLET | Freq: Three times a day (TID) | ORAL | Status: DC
  Administered 2016-09-30 (×4): 150 mg via ORAL
  Filled 2016-09-30 (×4): qty 1

## 2016-09-30 MED ORDER — PROCHLORPERAZINE EDISYLATE 5 MG/ML IJ SOLN
10.0000 mg | Freq: Four times a day (QID) | INTRAMUSCULAR | Status: DC | PRN
  Administered 2016-09-30: 10 mg via INTRAVENOUS
  Filled 2016-09-30: qty 2

## 2016-09-30 MED ORDER — TERIFLUNOMIDE 14 MG PO TABS: 1.0000 | ORAL_TABLET | Freq: Every day | ORAL | Status: DC

## 2016-09-30 MED ORDER — ZINC OXIDE 20 % EX OINT
1.0000 "application " | TOPICAL_OINTMENT | Freq: Three times a day (TID) | CUTANEOUS | Status: DC
  Administered 2016-09-30 – 2016-10-02 (×7): 1 via TOPICAL
  Filled 2016-09-30: qty 28.35

## 2016-09-30 NOTE — Progress Notes (Signed)
PHARMACY - PHYSICIAN COMMUNICATION CRITICAL VALUE ALERT - BLOOD CULTURE IDENTIFICATION (BCID)  Results for orders placed or performed during the hospital encounter of 09/29/16  Blood Culture ID Panel (Reflexed) (Collected: 09/29/2016  9:37 PM)  Result Value Ref Range   Enterococcus species NOT DETECTED NOT DETECTED   Listeria monocytogenes NOT DETECTED NOT DETECTED   Staphylococcus species NOT DETECTED NOT DETECTED   Staphylococcus aureus NOT DETECTED NOT DETECTED   Streptococcus species NOT DETECTED NOT DETECTED   Streptococcus agalactiae NOT DETECTED NOT DETECTED   Streptococcus pneumoniae NOT DETECTED NOT DETECTED   Streptococcus pyogenes NOT DETECTED NOT DETECTED   Acinetobacter baumannii NOT DETECTED NOT DETECTED   Enterobacteriaceae species DETECTED (A) NOT DETECTED   Enterobacter cloacae complex NOT DETECTED NOT DETECTED   Escherichia coli NOT DETECTED NOT DETECTED   Klebsiella oxytoca NOT DETECTED NOT DETECTED   Klebsiella pneumoniae NOT DETECTED NOT DETECTED   Proteus species NOT DETECTED NOT DETECTED   Serratia marcescens NOT DETECTED NOT DETECTED   Carbapenem resistance NOT DETECTED NOT DETECTED   Haemophilus influenzae NOT DETECTED NOT DETECTED   Neisseria meningitidis NOT DETECTED NOT DETECTED   Pseudomonas aeruginosa NOT DETECTED NOT DETECTED   Candida albicans NOT DETECTED NOT DETECTED   Candida glabrata NOT DETECTED NOT DETECTED   Candida krusei NOT DETECTED NOT DETECTED   Candida parapsilosis NOT DETECTED NOT DETECTED   Candida tropicalis NOT DETECTED NOT DETECTED    Name of physician (or Provider) Contacted: Dr. Sloan Leiter   Changes to prescribed antibiotics required: Patient is currently on vancomycin and cefepime. Recommend discontinuing vancomycin as cefepime should cover the gram negative organism to be identified by traditional culture techniques.  Dimitri Ped, PharmD, BCPS PGY-2 Infectious Diseases Pharmacy Resident Pager: [phone] 09/30/2016  2:29 PM

## 2016-09-30 NOTE — Progress Notes (Signed)
Pharmacy Antibiotic Note  Vanessa Short is a 51 y.o. female admitted on 09/29/2016 with pneumonia.  Pharmacy has been consulted for Vancomycin/Cefepime dosing. WBC is elevated. Renal function ok.   Plan: Vancomycin 1000 mg IV q12h Cefepime 1g IV q8h Trend WBC, temp, renal function  F/U infectious work-up Drug levels as indicated   Height: 5' 2"  (157.5 cm) Weight: 175 lb (79.4 kg) IBW/kg (Calculated) : 50.1  Temp (24hrs), Avg:99.6 F (37.6 C), Min:98.3 F (36.8 C), Max:100.6 F (38.1 C)   Recent Labs Lab 09/27/16 1644 09/29/16 2129 09/29/16 2148  WBC 10.3 19.8*  --   CREATININE  --  1.00  --   LATICACIDVEN  --   --  1.49    Estimated Creatinine Clearance: 64.9 mL/min (by C-G formula based on SCr of 1 mg/dL).    Allergies  Allergen Reactions  . Oxycodone-Acetaminophen Other (See Comments)    Other  . Penicillins Rash    Has patient had a PCN reaction causing immediate rash, facial/tongue/throat swelling, SOB or lightheadedness with hypotension: NO Has patient had a PCN reaction causing severe rash involving mucus membranes or skin necrosis: NO Has patient had a PCN reaction that required hospitalization NO Has patient had a PCN reaction occurring within the last 10 years:NO If all of the above answers are "NO", then may proceed with Cephalosporin use.   Narda Bonds 09/30/2016 2:52 AM

## 2016-09-30 NOTE — Progress Notes (Signed)
Husband( kelly) concerned about patient not getting her MS med Teriflunomide. RN called pharmacy on behalf of husband in regards to medication dosing. On initial call to pharmacy RN was instructed to come down and sign out medication. On arrival to pharmacy, RN was informed medication was on hold per MD request. This was reported to spouse who became upset about  Lack of calrity about med dosing. He states this was not relayed to him by any physician or day shift RN . RN at this point called on call physician Dr Maudie Mercury. Who did review notes and Patient's  med history. He  reiterated  Dr Sloan Leiter notes that  Stated med was to be on hold due to active pneumonia infection. RN did call husband's cell phone and reported conversation with Dr Maudie Mercury to him. RN also offered husband to have neurology  consult it he wished. At this point husband and patient  both declined. He seemed to agree at this point with the decision to hold medication. RN did instruct spouse to voice any continued  Concerns in regards to plan of care  with treatment team.

## 2016-09-30 NOTE — H&P (Signed)
History and Physical    Edell Mesenbrink GUR:[id number] DOB: 05/14/1966 DOA: 09/29/2016  PCP: [place]   Patient coming from: Home  Chief Complaint: Fever, rhinorrhea, dyspnea   HPI: Jatziri Kinderman is a 51 y.o. female with medical history significant for multiple sclerosis with spastic quadriplegia, ulcerative colitis, hypertension, chronic diastolic CHF, and depression who presents to the emergency department for evaluation of dyspnea, fevers, and rhinorrhea. Patient reports a couple days of copious clear rhinorrhea with cough. Today, she has remained febrile despite using 600 mg ibuprofen and she has developed progressive dyspnea. Patient denies any chest pain, palpitations, or lower extremity swelling or tenderness. She has not been vaccinated against seasonal influenza, denies any recent sick contacts, and denies recent long distance travel. Patient reports her cough to be nonproductive. She used Afrin at home with improvement in her rhinorrhea. Her dyspnea continued to progress, however, and she remained febrile, and came into the ED for evaluation of this.    ED Course: Upon arrival to the ED, patient is found to be febrile to 38.1 C, saturating adequately on room air, tachycardic to 110, and with vitals otherwise stable. EKG features a sinus tachycardia with rate 102 and QTc interval prolonged to 511 ms. Chest x-ray is limited by shallow inspiration, but notable for an opacity in the left base concerning for possible infiltrate versus atelectasis. Chemistry panel is notable for a potassium of only 2.5. CBC features a leukocytosis to 19,800 and a stable normocytic anemia with hemoglobin 10.1. Lactic acid is reassuring at 1.49 and INR is within the normal limits. Blood cultures were obtained and the patient was treated with empiric cefepime and vancomycin for suspected HCAP. She was given 40 mEq oral, and 30 mEq IV potassium and the EGD. Urinalysis has been ordered  and remains pending. Respiratory virus panel has been sent. Patient's fever was treated with acetaminophen and her tachycardia resolved. She remained hemodynamically stable and his not in acute respiratory distress at this time, but given her immunocompromised state and critical electrolyte abnormalities, she'll be admitted to the telemetry unit for ongoing evaluation and management of suspected HCAP, possible influenza, and hypokalemia with EKG changes.  Review of Systems:  All other systems reviewed and apart from HPI, are negative.  Past Medical History:  Diagnosis Date  . Headache   . Hypertension   . Kidney stones   . Movement disorder   . Multiple sclerosis ([place])   . Neuropathy ([place])   . Ulcerative colitis ([place])   . Vision abnormalities     Past Surgical History:  Procedure Laterality Date  . KIDNEY STONE SURGERY       reports that she has never smoked. She has never used smokeless tobacco. She reports that she does not drink alcohol or use drugs.  Allergies  Allergen Reactions  . Oxycodone-Acetaminophen Other (See Comments)    Other  . Penicillins Rash    Has patient had a PCN reaction causing immediate rash, facial/tongue/throat swelling, SOB or lightheadedness with hypotension: NO Has patient had a PCN reaction causing severe rash involving mucus membranes or skin necrosis: NO Has patient had a PCN reaction that required hospitalization NO Has patient had a PCN reaction occurring within the last 10 years:NO If all of the above answers are "NO", then may proceed with Cephalosporin use.    Family History  Problem Relation Age of Onset  . Dementia Mother   . Hypertension Father   . Hyperlipidemia Father   . Diabetes Father   .  Heart disease Father      Prior to Admission medications   Medication Sig Start Date End Date Taking? Authorizing Provider  acetaminophen (TYLENOL) 500 MG tablet Take 500 mg by mouth every 6 (six) hours as needed for mild pain or moderate  pain.    Historical Provider, MD  acyclovir (ZOVIRAX) 400 MG tablet Take 400 mg by mouth daily. Take every day per patient 08/11/14   Historical Provider, MD  amitriptyline (ELAVIL) 25 MG tablet Take 1 tablet (25 mg total) by mouth at bedtime. 09/27/16   Britt Bottom, MD  aspirin 81 MG chewable tablet Chew 1 tablet (81 mg total) by mouth daily. Patient not taking: Reported on 09/27/2016 08/19/16   Ripudeep Krystal Eaton, MD  atorvastatin (LIPITOR) 20 MG tablet Take 1 tablet (20 mg total) by mouth daily at 6 PM. Patient not taking: Reported on 09/27/2016 08/19/16   Ripudeep Krystal Eaton, MD  b complex vitamins tablet Take 1 tablet by mouth daily.     Historical Provider, MD  baclofen (LIORESAL) 10 MG tablet Take 1.5 tablets (15 mg total) by mouth at bedtime. 08/19/16   Ripudeep Krystal Eaton, MD  baclofen (LIORESAL) 10 MG tablet Take 10 mg by mouth 3 (three) times daily.    Historical Provider, MD  bisoprolol (ZEBETA) 5 MG tablet Take 1 tablet (5 mg total) by mouth daily. Patient not taking: Reported on 09/27/2016 08/19/16   Ripudeep Krystal Eaton, MD  busPIRone (BUSPAR) 15 MG tablet Take 1 tablet (15 mg total) by mouth 2 (two) times daily. 09/27/16   Britt Bottom, MD  cholecalciferol (VITAMIN D) 1000 UNITS tablet Take 1,000 Units by mouth daily.     Historical Provider, MD  dantrolene (DANTRIUM) 50 MG capsule Take 50 mg by mouth 3 (three) times daily.    Historical Provider, MD  docusate sodium (COLACE) 100 MG capsule Take 100 mg by mouth daily.    Historical Provider, MD  FLUoxetine (PROZAC) 40 MG capsule Take 1 capsule (40 mg total) by mouth daily. 09/07/14   Britt Bottom, MD  FOLIC ACID PO Take 277 mcg by mouth daily.     Historical Provider, MD  furosemide (LASIX) 40 MG tablet Take 1 tablet (40 mg total) by mouth 2 (two) times daily. 08/19/16   Ripudeep Krystal Eaton, MD  lamoTRIgine (LAMICTAL) 200 MG tablet Take 1 tablet (200 mg total) by mouth 3 (three) times daily. 05/09/16   Britt Bottom, MD  mesalamine (PENTASA) 500 MG CR capsule  Take 1,000 mg by mouth 3 (three) times daily.     Historical Provider, MD  NUTRITIONAL SUPPLEMENT LIQD Take 120 mLs by mouth 2 (two) times daily. [employer]    Historical Provider, MD  omeprazole (PRILOSEC) 40 MG capsule Take 40 mg by mouth 2 (two) times daily.     Historical Provider, MD  OXcarbazepine (TRILEPTAL) 150 MG tablet Take 1 tablet (150 mg total) by mouth 3 (three) times daily. 06/01/16   Britt Bottom, MD  polyethylene glycol (MIRALAX / GLYCOLAX) packet Take 17 g by mouth daily. Patient not taking: Reported on 09/27/2016 08/19/16   Ripudeep Krystal Eaton, MD  sodium chloride (OCEAN) 0.65 % SOLN nasal spray Place 1 spray into both nostrils as needed for congestion. 08/19/16   Ripudeep Krystal Eaton, MD  solifenacin (VESICARE) 10 MG tablet Take 10 mg by mouth daily.     Historical Provider, MD  Teriflunomide (AUBAGIO) 14 MG TABS Take 1 tablet by mouth daily. 03/14/16   Richard  A Sater, MD  zinc oxide 20 % ointment Apply 1 application topically 3 (three) times daily. Apply to bilateral buttocks every shift as a preventative measure    Historical Provider, MD    Physical Exam: Vitals:   09/29/16 2121 09/29/16 2315 09/30/16 0016 09/30/16 0111  BP:  114/70 109/62 126/70  Pulse:  102 93 96  Resp:  22 21 18   Temp: 100.6 F (38.1 C)   98.3 F (36.8 C)  TempSrc: Axillary   Oral  SpO2:  95% 95% 100%  Weight:      Height:          Constitutional: NAD, calm, obese Eyes: PERTLA, lids and conjunctivae normal ENMT: Mucous membranes are moist. Posterior pharynx clear of any exudate or lesions.   Neck: normal, supple, no masses, no thyromegaly Respiratory: Coarse upper airways noise, no wheezing, no crackles. Normal respiratory effort.   Cardiovascular: Rate ~110 and regular. Trace bilateral pretibial edema. JVP not well-visualized. Abdomen: No distension, no tenderness, no masses palpated. Bowel sounds normal.  Musculoskeletal: no clubbing / cyanosis. No joint deformity upper and lower extremities.    Skin:  no significant rashes, lesions, ulcers. Warm, dry, well-perfused. Neurologic: CN 2-12 grossly intact. Spastic quadriplegia.   Psychiatric: Normal judgment and insight. Alert and oriented x 3. Normal mood and affect.     Labs on Admission: I have personally reviewed following labs and imaging studies  CBC:  Recent Labs Lab 09/27/16 1644 09/29/16 2129  WBC 10.3 19.8*  NEUTROABS 7.1* 16.8*  HGB  --  10.1*  HCT 32.6* 32.1*  MCV 99* 99.7  PLT 232 035   Basic Metabolic Panel:  Recent Labs Lab 09/29/16 2129  NA 137  K 2.5*  CL 99*  CO2 25  GLUCOSE 148*  BUN 14  CREATININE 1.00  CALCIUM 8.8*   GFR: Estimated Creatinine Clearance: 64.9 mL/min (by C-G formula based on SCr of 1 mg/dL). Liver Function Tests:  Recent Labs Lab 09/29/16 2129  AST 22  ALT 22  ALKPHOS 84  BILITOT 0.9  PROT 6.2*  ALBUMIN 3.4*   No results for input(s): LIPASE, AMYLASE in the last 168 hours. No results for input(s): AMMONIA in the last 168 hours. Coagulation Profile:  Recent Labs Lab 09/29/16 2130  INR 0.98   Cardiac Enzymes: No results for input(s): CKTOTAL, CKMB, CKMBINDEX, TROPONINI in the last 168 hours. BNP (last 3 results) No results for input(s): PROBNP in the last 8760 hours. HbA1C: No results for input(s): HGBA1C in the last 72 hours. CBG: No results for input(s): GLUCAP in the last 168 hours. Lipid Profile: No results for input(s): CHOL, HDL, LDLCALC, TRIG, CHOLHDL, LDLDIRECT in the last 72 hours. Thyroid Function Tests: No results for input(s): TSH, T4TOTAL, FREET4, T3FREE, THYROIDAB in the last 72 hours. Anemia Panel: No results for input(s): VITAMINB12, FOLATE, FERRITIN, TIBC, IRON, RETICCTPCT in the last 72 hours. Urine analysis:    Component Value Date/Time   COLORURINE AMBER (A) 05/31/2013 0237   APPEARANCEUR CLOUDY (A) 05/31/2013 0237   LABSPEC 1.027 05/31/2013 0237   PHURINE 5.5 05/31/2013 0237   GLUCOSEU NEGATIVE 05/31/2013 0237   HGBUR NEGATIVE  05/31/2013 0237   BILIRUBINUR SMALL (A) 05/31/2013 0237   KETONESUR 15 (A) 05/31/2013 0237   PROTEINUR NEGATIVE 05/31/2013 0237   UROBILINOGEN 0.2 05/31/2013 0237   NITRITE NEGATIVE 05/31/2013 0237   LEUKOCYTESUR NEGATIVE 05/31/2013 0237   Sepsis Labs: @LABRCNTIP (procalcitonin:4,lacticidven:4) )No results found for this or any previous visit (from the past 240 hour(s)).  Radiological Exams on Admission: Dg Chest 2 View  Result Date: 09/29/2016 CLINICAL DATA:  Congestion and fevers beginning this afternoon. History of multiple sclerosis. EXAM: CHEST  2 VIEW COMPARISON:  08/15/2016 FINDINGS: Shallow inspiration with atelectasis or infiltration in the left lung base. Pneumonia is not excluded. No pleural effusions. No pneumothorax. Heart size and pulmonary vascularity are normal. Right lung is clear. Calcification of the aorta. IMPRESSION: Shallow inspiration with atelectasis or infiltration in the left lung base. Pneumonia is not excluded. Electronically Signed   By: Lucienne Capers M.D.   On: 09/29/2016 22:05    EKG: Independently reviewed. Sinus tachycardia (rate 102), QTc 511 ms  Assessment/Plan  1. HCAP, ?Flu  - Presents with fevers, rhinorrhea, cough, and SOB  - Found to have marked leukocytosis, tachycardia, opacity in left base on CXR concerning for PNA  - Blood cultures obtained in ED and respiratory virus panel sent; will obtain sputum cultures and check urine for strep pneumo antigen  - She was started on empiric cefepime and vancomycin given recent admission  - Continue empiric abx, add empiric Tamiflu pending resp virus panel, supportive care with supplemental O2 and APAP as needed  2. Hypokalemia   - Serum potassium 2.5 on admission with prolonged QT interval  - Uncertain etiology, likely secondary to Lasix use   - She was given 40 mEq oral, and 30 mEq IV potassium in ED; 2 g IV magnesium given empirically  - She will be monitored on telemetry; repeat chemistries with  mag level in am    3. Chronic diastolic CHF  - Appears to be mildly volume-up on admission  - TTE (08/08/16) with EF 70-62%, grade 2 diastolic dysfunction, moderate concentric LVH, and mild LAE  - She is managed at home with beta-blocker and Lasix 40 mg BID; this is held on admission given acute infection and critical hypokalemia with prolonged QT  - SLIV, fluid-restrict diet, follow daily wts and I/O's    4. Multiple sclerosis, spastic quadriplegia  - Stable on admission  - Continue current management with teriflunomide, dantrolene, baclofen   5. Hypertension  - BP soft on admission and bisoprolol held    6. Normocytic anemia  - Hgb is 10.1 on admission and stable relative to priors with no apparent bleeding  - Monitor    7. Ulcerative colitis  - Stable on admission, will continue mesalamine  8. Depression - Appears to be stable on admission  - Prozac and Elavil held on admission d/t prolonged QTc  - Continue Buspar and Lamictal   9. Prolonged QT interval - QTc 511 ms on admission  - Likely secondary to critical hypokalemia; replaced as above  - Monitor on telemetry and avoid offending agents    DVT prophylaxis: sq Lovenox  Code Status: Full  Family Communication: Husband updated at bedside Disposition Plan: Admit to telemetry Consults called: None Admission status: Inpatient    Vianne Bulls, MD [facility] Pager [phone]  If 7PM-7AM, please contact night-coverage [email or web address] Password [email or web address]  09/30/2016, 1:49 AM

## 2016-09-30 NOTE — Progress Notes (Signed)
Rn did report to Dr Maudie Mercury patients blood pressure manually was 160/72 and that she appeared to have some occasional periods off confusion. No new orders given at this time. RN will continue to monitor patient closely during shift.

## 2016-09-30 NOTE — Progress Notes (Signed)
PROGRESS NOTE        PATIENT DETAILS Name: Vanessa Short Age: 51 y.o. Sex: female Date of Birth: [birth year] Admit Date: 09/29/2016 Admitting Physician Vianne Bulls, MD LEX:[id number]  Brief Narrative: Patient is a 51 y.o. 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 She was found to have significant leukocytosis, and a chest x-ray suggestive of left sided pneumonia. She was subsequently admitted for further evaluation and treatment.  Subjective: Her main issue this morning is nasal congestion-making it difficult for her to take a deep breath. Denies shortness of breath or chest pain to me.  Assessment/Plan: Probable healthcare associated pneumonia: Afebrile overnight, leukocytosis slowly down trending-slowly improving as well. Have added Claritin/Flonase/Afrin to see if we can manage her sinus/nasal congestion. Respiratory virus panel negative including influenza- discontinue Tamiflu. Continue empiric vancomycin and cefepime-await culture data.  Hypokalemia: Likely secondary to Lasix. Continue to replete, recheck electrolytes tomorrow morning.  Normocytic anemia: Drop in hemoglobin likely secondary to IV fluid dilution and acute illness. No evidence of overt blood loss at this time. Trend CBC.  Prolonged QT interval:-QTc 511 ms on admission- likely secondary to hypokalemia; K being repleted-recheck electrolytes and EKG in a.m.  Chronic diastolic heart failure: Weight appears to be lower than her most recent weight on discharge last admission (175 pounds). He does not appear volume overloaded as well. Stop all IV fluids, continue to hold diuretics.  History of Multiple sclerosis with spastic quadriplegia: Followed by neurology as  outpatient-hold teriflunomide given active infection-continue with dantrolene and baclofen   Hypertension:BP is stable but on the lower side-continue to hold bisoprolol for now.-   Ulcerative colitis: Stable-continue mesalamine  Depression: Appears to be stable.Continue Buspar and Lamictal.Prozac and Elavil held on admission d/t prolonged QTc-resume when able.   DVT Prophylaxis: Prophylactic Lovenox  Code Status: Full code   Family Communication: None at bedside  Disposition Plan: Remain inpatient-but will plan on Home health vs SNF on discharge  Antimicrobial agents: Anti-infectives    Start     Dose/Rate Route Frequency Ordered Stop   09/30/16 1000  acyclovir (ZOVIRAX) tablet 400 mg    Comments:  Take every day per patient     400 mg Oral Daily 09/30/16 0149     09/30/16 1000  vancomycin (VANCOCIN) IVPB 1000 mg/200 mL premix     1,000 mg 200 mL/hr over 60 Minutes Intravenous Every 12 hours 09/30/16 0255     09/30/16 0600  ceFEPIme (MAXIPIME) 1 g in dextrose 5 % 50 mL IVPB     1 g 100 mL/hr over 30 Minutes Intravenous Every 8 hours 09/30/16 0255     09/30/16 0000  oseltamivir (TAMIFLU) capsule 75 mg     75 mg Oral 2 times daily 09/29/16 2356 10/04/16 2159   09/29/16 2330  vancomycin (VANCOCIN) IVPB 1000 mg/200 mL premix     1,000 mg 200 mL/hr over 60 Minutes Intravenous  Once 09/29/16 2325 09/30/16 0450   09/29/16 2330  ceFEPIme (MAXIPIME) 2 g in dextrose 5 % 50 mL IVPB     2 g 100 mL/hr over 30 Minutes Intravenous  Once 09/29/16 2325 09/30/16 0045      Procedures: None  CONSULTS:  None  Time  spent: 25 minutes-Greater than 50% of this time was spent in counseling, explanation of diagnosis, planning of further management, and coordination of care.  MEDICATIONS: Scheduled Meds: . acyclovir  400 mg Oral Daily  . baclofen  10 mg Oral TID WC  . baclofen  15 mg Oral QHS  . busPIRone  15 mg Oral BID  . ceFEPime (MAXIPIME) IV  1 g Intravenous Q8H  .  cholecalciferol  1,000 Units Oral Daily  . dantrolene  50 mg Oral TID WC  . darifenacin  15 mg Oral Daily  . docusate sodium  100 mg Oral Daily  . enoxaparin (LOVENOX) injection  40 mg Subcutaneous Daily  . feeding supplement  240 mL Oral BID BM  . fluticasone  2 spray Each Nare Daily  . folic acid  1 mg Oral Daily  . guaiFENesin  600 mg Oral BID  . lamoTRIgine  200 mg Oral TID  . loratadine  10 mg Oral Daily  . mesalamine  1,000 mg Oral TID WC  . oseltamivir  75 mg Oral BID  . OXcarbazepine  150 mg Oral TID  . oxymetazoline  1 spray Each Nare BID  . pantoprazole  40 mg Oral Daily  . sodium chloride flush  3 mL Intravenous Q12H  . Teriflunomide  1 tablet Oral Daily  . vancomycin  1,000 mg Intravenous Q12H  . zinc oxide  1 application Topical TID   Continuous Infusions: PRN Meds:.sodium chloride, acetaminophen, fentaNYL (SUBLIMAZE) injection, sodium chloride flush   PHYSICAL EXAM: Vital signs: Vitals:   09/29/16 2315 09/30/16 0016 09/30/16 0111 09/30/16 0617  BP: 114/70 109/62 126/70 (!) 102/50  Pulse: 102 93 96 85  Resp: 22 21 18 17   Temp:   98.3 F (36.8 C) 98.2 F (36.8 C)  TempSrc:   Oral Oral  SpO2: 95% 95% 100% 94%  Weight:   78.6 kg (173 lb 4.5 oz)   Height:   5' 3"  (1.6 m)    Filed Weights   09/29/16 2117 09/30/16 0111  Weight: 79.4 kg (175 lb) 78.6 kg (173 lb 4.5 oz)   Body mass index is 30.7 kg/m.   General appearance :Awake, alert, not in any distress. Speech Clear. Looks chronically sick appearing. Eyes:, pupils equally reactive to light and accomodation,no scleral icterus HEENT: Atraumatic and Normocephalic Neck: supple, no JVD. No cervical lymphadenopathy.  Resp:Good air entry bilaterally, no added sounds  CVS: S1 S2 regular, no murmurs.  GI: Bowel sounds present, Non tender and not distended with no gaurding, rigidity or rebound.No organomegaly Extremities: B/L Lower Ext shows no edema, both legs are warm to touch Neurology:  speech clear,Non  focal, sensation is grossly intact. Psychiatric: Normal judgment and insight. Alert and oriented x 3. Quadriplegic Musculoskeletal:No digital cyanosis Skin:No Rash, warm and dry Wounds:N/A  I have personally reviewed following labs and imaging studies  LABORATORY DATA: CBC:  Recent Labs Lab 09/27/16 1644 09/29/16 2129 09/30/16 0554  WBC 10.3 19.8* 13.3*  NEUTROABS 7.1* 16.8* 9.8*  HGB  --  10.1* 8.4*  HCT 32.6* 32.1* 26.9*  MCV 99* 99.7 100.7*  PLT 232 196 144*    Basic Metabolic Panel:  Recent Labs Lab 09/29/16 2129 09/30/16 0554  NA 137 140  K 2.5* 2.5*  CL 99* 103  CO2 25 27  GLUCOSE 148* 134*  BUN 14 17  CREATININE 1.00 1.03*  CALCIUM 8.8* 8.5*  MG  --  2.7*    GFR: Estimated Creatinine Clearance: 64.2 mL/min (by C-G formula based  on SCr of 1.03 mg/dL (H)).  Liver Function Tests:  Recent Labs Lab 09/29/16 2129  AST 22  ALT 22  ALKPHOS 84  BILITOT 0.9  PROT 6.2*  ALBUMIN 3.4*   No results for input(s): LIPASE, AMYLASE in the last 168 hours. No results for input(s): AMMONIA in the last 168 hours.  Coagulation Profile:  Recent Labs Lab 09/29/16 2130  INR 0.98    Cardiac Enzymes: No results for input(s): CKTOTAL, CKMB, CKMBINDEX, TROPONINI in the last 168 hours.  BNP (last 3 results) No results for input(s): PROBNP in the last 8760 hours.  HbA1C: No results for input(s): HGBA1C in the last 72 hours.  CBG: No results for input(s): GLUCAP in the last 168 hours.  Lipid Profile: No results for input(s): CHOL, HDL, LDLCALC, TRIG, CHOLHDL, LDLDIRECT in the last 72 hours.  Thyroid Function Tests: No results for input(s): TSH, T4TOTAL, FREET4, T3FREE, THYROIDAB in the last 72 hours.  Anemia Panel: No results for input(s): VITAMINB12, FOLATE, FERRITIN, TIBC, IRON, RETICCTPCT in the last 72 hours.  Urine analysis:    Component Value Date/Time   COLORURINE AMBER (A) 05/31/2013 0237   APPEARANCEUR CLOUDY (A) 05/31/2013 0237   LABSPEC  1.027 05/31/2013 0237   PHURINE 5.5 05/31/2013 0237   GLUCOSEU NEGATIVE 05/31/2013 0237   HGBUR NEGATIVE 05/31/2013 0237   BILIRUBINUR SMALL (A) 05/31/2013 0237   KETONESUR 15 (A) 05/31/2013 0237   PROTEINUR NEGATIVE 05/31/2013 0237   UROBILINOGEN 0.2 05/31/2013 0237   NITRITE NEGATIVE 05/31/2013 0237   LEUKOCYTESUR NEGATIVE 05/31/2013 0237    Sepsis Labs: Lactic Acid, Venous    Component Value Date/Time   LATICACIDVEN 1.49 09/29/2016 2148    MICROBIOLOGY: Recent Results (from the past 240 hour(s))  Culture, blood (Routine x 2)     Status: None (Preliminary result)   Collection Time: 09/29/16  9:30 PM  Result Value Ref Range Status   Specimen Description BLOOD RIGHT ARM  Final   Special Requests BOTTLES DRAWN AEROBIC AND ANAEROBIC 5CC  Final   Culture NO GROWTH < 24 HOURS  Final   Report Status PENDING  Incomplete  Culture, blood (Routine x 2)     Status: None (Preliminary result)   Collection Time: 09/29/16  9:37 PM  Result Value Ref Range Status   Specimen Description BLOOD LEFT ARM  Final   Special Requests IN PEDIATRIC BOTTLE 4CC  Final   Culture  Setup Time   Final    GRAM NEGATIVE RODS IN PEDIATRIC BOTTLE Organism ID to follow    Culture NO GROWTH < 24 HOURS  Final   Report Status PENDING  Incomplete  Respiratory Panel by PCR     Status: None   Collection Time: 09/29/16 11:15 PM  Result Value Ref Range Status   Adenovirus NOT DETECTED NOT DETECTED Final   Coronavirus 229E NOT DETECTED NOT DETECTED Final   Coronavirus HKU1 NOT DETECTED NOT DETECTED Final   Coronavirus NL63 NOT DETECTED NOT DETECTED Final   Coronavirus OC43 NOT DETECTED NOT DETECTED Final   Metapneumovirus NOT DETECTED NOT DETECTED Final   Rhinovirus / Enterovirus NOT DETECTED NOT DETECTED Final   Influenza A NOT DETECTED NOT DETECTED Final   Influenza B NOT DETECTED NOT DETECTED Final   Parainfluenza Virus 1 NOT DETECTED NOT DETECTED Final   Parainfluenza Virus 2 NOT DETECTED NOT DETECTED  Final   Parainfluenza Virus 3 NOT DETECTED NOT DETECTED Final   Parainfluenza Virus 4 NOT DETECTED NOT DETECTED Final   Respiratory Syncytial Virus  NOT DETECTED NOT DETECTED Final   Bordetella pertussis NOT DETECTED NOT DETECTED Final   Chlamydophila pneumoniae NOT DETECTED NOT DETECTED Final   Mycoplasma pneumoniae NOT DETECTED NOT DETECTED Final    RADIOLOGY STUDIES/RESULTS: Dg Chest 2 View  Result Date: 09/29/2016 CLINICAL DATA:  Congestion and fevers beginning this afternoon. History of multiple sclerosis. EXAM: CHEST  2 VIEW COMPARISON:  08/15/2016 FINDINGS: Shallow inspiration with atelectasis or infiltration in the left lung base. Pneumonia is not excluded. No pleural effusions. No pneumothorax. Heart size and pulmonary vascularity are normal. Right lung is clear. Calcification of the aorta. IMPRESSION: Shallow inspiration with atelectasis or infiltration in the left lung base. Pneumonia is not excluded. Electronically Signed   By: Lucienne Capers M.D.   On: 09/29/2016 22:05     LOS: 1 day   Oren Binet, MD  [facility] Pager:[phone]  If 7PM-7AM, please contact night-coverage [email or web address] Password [email or web address] 09/30/2016, 1:51 PM

## 2016-10-01 DIAGNOSIS — I5032 Chronic diastolic (congestive) heart failure: Secondary | ICD-10-CM

## 2016-10-01 LAB — CBC
HCT: 33 % — ABNORMAL LOW (ref 36.0–46.0)
HEMOGLOBIN: 10.4 g/dL — AB (ref 12.0–15.0)
MCH: 31.4 pg (ref 26.0–34.0)
MCHC: 31.5 g/dL (ref 30.0–36.0)
MCV: 99.7 fL (ref 78.0–100.0)
PLATELETS: 210 10*3/uL (ref 150–400)
RBC: 3.31 MIL/uL — AB (ref 3.87–5.11)
RDW: 17.9 % — ABNORMAL HIGH (ref 11.5–15.5)
WBC: 13.3 10*3/uL — AB (ref 4.0–10.5)

## 2016-10-01 LAB — URINALYSIS, ROUTINE W REFLEX MICROSCOPIC
BILIRUBIN URINE: NEGATIVE
GLUCOSE, UA: NEGATIVE mg/dL
KETONES UR: NEGATIVE mg/dL
NITRITE: NEGATIVE
Protein, ur: 30 mg/dL — AB
Specific Gravity, Urine: 1.015 (ref 1.005–1.030)
Squamous Epithelial / LPF: NONE SEEN
pH: 5 (ref 5.0–8.0)

## 2016-10-01 LAB — MAGNESIUM: MAGNESIUM: 2.3 mg/dL (ref 1.7–2.4)

## 2016-10-01 LAB — BASIC METABOLIC PANEL
Anion gap: 11 (ref 5–15)
BUN: 9 mg/dL (ref 6–20)
CHLORIDE: 104 mmol/L (ref 101–111)
CO2: 26 mmol/L (ref 22–32)
Calcium: 9.4 mg/dL (ref 8.9–10.3)
Creatinine, Ser: 0.72 mg/dL (ref 0.44–1.00)
GFR calc Af Amer: >60 mL/min (ref >60)
Glucose, Bld: 108 mg/dL — ABNORMAL HIGH (ref 65–99)
POTASSIUM: 3.8 mmol/L (ref 3.5–5.1)
SODIUM: 141 mmol/L (ref 135–145)

## 2016-10-01 LAB — STREP PNEUMONIAE URINARY ANTIGEN: Strep Pneumo Urinary Antigen: NEGATIVE

## 2016-10-01 MED ORDER — ADULT MULTIVITAMIN W/MINERALS CH
1.0000 | ORAL_TABLET | Freq: Every day | ORAL | Status: DC
  Administered 2016-10-01 – 2016-10-02 (×2): 1 via ORAL
  Filled 2016-10-01 (×3): qty 1

## 2016-10-01 MED ORDER — LABETALOL HCL 100 MG PO TABS
100.0000 mg | ORAL_TABLET | Freq: Every day | ORAL | Status: DC
  Administered 2016-10-01 – 2016-10-02 (×2): 100 mg via ORAL
  Filled 2016-10-01 (×2): qty 1

## 2016-10-01 MED ORDER — ATORVASTATIN CALCIUM 20 MG PO TABS
20.0000 mg | ORAL_TABLET | Freq: Every day | ORAL | Status: DC
  Administered 2016-10-01: 20 mg via ORAL
  Filled 2016-10-01: qty 1

## 2016-10-01 MED ORDER — AMITRIPTYLINE HCL 50 MG PO TABS: 25.0000 mg | ORAL_TABLET | Freq: Every day | ORAL | Status: DC

## 2016-10-01 MED ORDER — FLUOXETINE HCL 20 MG PO CAPS
40.0000 mg | ORAL_CAPSULE | Freq: Every day | ORAL | Status: DC
  Administered 2016-10-01 – 2016-10-02 (×2): 40 mg via ORAL
  Filled 2016-10-01 (×2): qty 2

## 2016-10-01 MED ORDER — MAGNESIUM SULFATE IN D5W 1-5 GM/100ML-% IV SOLN: 1.0000 g | Freq: Once | INTRAVENOUS | Status: DC

## 2016-10-01 NOTE — Progress Notes (Signed)
PROGRESS NOTE        PATIENT DETAILS Name: Vanessa Short Age: 51 y.o. Sex: female Date of Birth: May 27, 1966 Admit Date: 09/29/2016 Admitting Physician Vianne Bulls, MD [id number]  Brief Narrative:  Patient is a 51 y.o. 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 She was found to have significant leukocytosis, and a chest x-ray suggestive of left sided pneumonia. She was subsequently admitted for further evaluation and treatment.  Subjective:  Patient in bed, appears comfortable, feels better, denies any fever or chills or headache, no chest or abdominal pain, no new weakness.  Assessment/Plan:  Pneumonia. Second episode in a few months, bacteremic as well, currently on appropriate antibiotics which is IV cefepime, continue for a total of 10 days, will also have speech evaluate and rule out aspiration, check a UA as well.  Hypokalemia: Replaces & stable.  Normocytic anemia: AOCD + dilution.  Prolonged QT interval:-QTc 511 ms on admission- likely secondary to hypokalemia; K being repleted-recheck EKG.  Chronic diastolic heart failure: Baseline weight 175 pounds, compensated, diuretics on hold.  History of Multiple sclerosis with spastic quadriplegia: Followed by neurology as outpatient-hold teriflunomide given active infection with bacteremia-continue with dantrolene and baclofen   Hypertension:BP is stable resume home labetalol.   Ulcerative colitis: Stable-continue mesalamine.  Depression: Appears to be stable.Continue Buspar and Lamictal.    DVT Prophylaxis:  Prophylactic Lovenox  Code Status:  Full code   Family Communication:  None at bedside  Disposition Plan:  Remain inpatient-but will plan on Home  health vs SNF on discharge  Antimicrobial agents:  Anti-infectives    Start     Dose/Rate Route Frequency Ordered Stop   09/30/16 1000  acyclovir (ZOVIRAX) tablet 400 mg    Comments:  Take every day per patient     400 mg Oral Daily 09/30/16 0149     09/30/16 1000  vancomycin (VANCOCIN) IVPB 1000 mg/200 mL premix  Status:  Discontinued     1,000 mg 200 mL/hr over 60 Minutes Intravenous Every 12 hours 09/30/16 0255 09/30/16 1433   09/30/16 0600  ceFEPIme (MAXIPIME) 1 g in dextrose 5 % 50 mL IVPB     1 g 100 mL/hr over 30 Minutes Intravenous Every 8 hours 09/30/16 0255     09/30/16 0000  oseltamivir (TAMIFLU) capsule 75 mg  Status:  Discontinued     75 mg Oral 2 times daily 09/29/16 2356 09/30/16 1401   09/29/16 2330  vancomycin (VANCOCIN) IVPB 1000 mg/200 mL premix     1,000 mg 200 mL/hr over 60 Minutes Intravenous  Once 09/29/16 2325 09/30/16 0450   09/29/16 2330  ceFEPIme (MAXIPIME) 2 g in dextrose 5 % 50 mL IVPB     2 g 100 mL/hr over 30 Minutes Intravenous  Once 09/29/16 2325 09/30/16 0045      Procedures: None  CONSULTS:  None  Time spent: 25 minutes-Greater than 50% of this time was spent in counseling, explanation of diagnosis, planning of further management, and coordination of care.  MEDICATIONS: Scheduled Meds: . acyclovir  400 mg Oral Daily  . baclofen  10 mg Oral TID WC  . baclofen  15 mg Oral QHS  . busPIRone  15 mg Oral BID  .  ceFEPime (MAXIPIME) IV  1 g Intravenous Q8H  . cholecalciferol  1,000 Units Oral Daily  . dantrolene  50 mg Oral TID WC  . darifenacin  15 mg Oral Daily  . docusate sodium  100 mg Oral Daily  . enoxaparin (LOVENOX) injection  40 mg Subcutaneous Daily  . feeding supplement  240 mL Oral BID BM  . fluticasone  2 spray Each Nare Daily  . folic acid  1 mg Oral Daily  . guaiFENesin  600 mg Oral BID  . lamoTRIgine  200 mg Oral TID  . loratadine  10 mg Oral Daily  . mesalamine  1,000 mg Oral TID WC  . OXcarbazepine  150 mg Oral TID  .  oxymetazoline  1 spray Each Nare BID  . pantoprazole  40 mg Oral Daily  . zinc oxide  1 application Topical TID   Continuous Infusions: PRN Meds:.acetaminophen, prochlorperazine   PHYSICAL EXAM: Vital signs: Vitals:   09/30/16 1442 09/30/16 2113 09/30/16 2200 10/01/16 0527  BP: (!) 144/68 (!) 184/92 (!) 160/72 (!) 175/97  Pulse: 96 (!) 109  98  Resp: 18 18  18   Temp: 98.8 F (37.1 C) 98.3 F (36.8 C)  98.7 F (37.1 C)  TempSrc: Oral Oral  Oral  SpO2: 98% 96%  96%  Weight:      Height:       Filed Weights   09/29/16 2117 09/30/16 0111  Weight: 79.4 kg (175 lb) 78.6 kg (173 lb 4.5 oz)   Body mass index is 30.7 kg/m.   General appearance :Awake, alert, not in any distress. Speech Clear. Looks chronically sick appearing. Eyes:, pupils equally reactive to light and accomodation,no scleral icterus HEENT: Atraumatic and Normocephalic Neck: supple, no JVD. No cervical lymphadenopathy.  Resp:Good air entry bilaterally, no added sounds  CVS: S1 S2 regular, no murmurs.  GI: Bowel sounds present, Non tender and not distended with no gaurding, rigidity or rebound.No organomegaly Extremities: B/L Lower Ext shows no edema, both legs are warm to touch Neurology:  speech clear,Non focal, sensation is grossly intact. Psychiatric: Normal judgment and insight. Alert and oriented x 3. Quadriplegic Musculoskeletal:No digital cyanosis Skin:No Rash, warm and dry Wounds:N/A  I have personally reviewed following labs and imaging studies  LABORATORY DATA: CBC:  Recent Labs Lab 09/27/16 1644 09/29/16 2129 09/30/16 0554 10/01/16 0600  WBC 10.3 19.8* 13.3* 13.3*  NEUTROABS 7.1* 16.8* 9.8*  --   HGB  --  10.1* 8.4* 10.4*  HCT 32.6* 32.1* 26.9* 33.0*  MCV 99* 99.7 100.7* 99.7  PLT 232 196 144* 389    Basic Metabolic Panel:  Recent Labs Lab 09/29/16 2129 09/30/16 0554 10/01/16 0600  NA 137 140 141  K 2.5* 2.5* 3.8  CL 99* 103 104  CO2 25 27 26   GLUCOSE 148* 134* 108*  BUN  14 17 9   CREATININE 1.00 1.03* 0.72  CALCIUM 8.8* 8.5* 9.4  MG  --  2.7* 2.3    GFR: Estimated Creatinine Clearance: 82.6 mL/min (by C-G formula based on SCr of 0.72 mg/dL).  Liver Function Tests:  Recent Labs Lab 09/29/16 2129  AST 22  ALT 22  ALKPHOS 84  BILITOT 0.9  PROT 6.2*  ALBUMIN 3.4*   No results for input(s): LIPASE, AMYLASE in the last 168 hours. No results for input(s): AMMONIA in the last 168 hours.  Coagulation Profile:  Recent Labs Lab 09/29/16 2130  INR 0.98    Cardiac Enzymes: No results for input(s): CKTOTAL, CKMB, CKMBINDEX, TROPONINI in  the last 168 hours.  BNP (last 3 results) No results for input(s): PROBNP in the last 8760 hours.  HbA1C: No results for input(s): HGBA1C in the last 72 hours.  CBG:  Recent Labs Lab 09/30/16 2236  GLUCAP 141*    Lipid Profile: No results for input(s): CHOL, HDL, LDLCALC, TRIG, CHOLHDL, LDLDIRECT in the last 72 hours.  Thyroid Function Tests: No results for input(s): TSH, T4TOTAL, FREET4, T3FREE, THYROIDAB in the last 72 hours.  Anemia Panel: No results for input(s): VITAMINB12, FOLATE, FERRITIN, TIBC, IRON, RETICCTPCT in the last 72 hours.  Urine analysis:    Component Value Date/Time   COLORURINE AMBER (A) 05/31/2013 0237   APPEARANCEUR CLOUDY (A) 05/31/2013 0237   LABSPEC 1.027 05/31/2013 0237   PHURINE 5.5 05/31/2013 0237   GLUCOSEU NEGATIVE 05/31/2013 0237   HGBUR NEGATIVE 05/31/2013 0237   BILIRUBINUR SMALL (A) 05/31/2013 0237   KETONESUR 15 (A) 05/31/2013 0237   PROTEINUR NEGATIVE 05/31/2013 0237   UROBILINOGEN 0.2 05/31/2013 0237   NITRITE NEGATIVE 05/31/2013 0237   LEUKOCYTESUR NEGATIVE 05/31/2013 0237    Sepsis Labs:  Lactic Acid, Venous     Component Value Date/Time   LATICACIDVEN 1.49 09/29/2016 2148    MICROBIOLOGY:  Recent Results (from the past 240 hour(s))  Culture, blood (Routine x 2)     Status: None (Preliminary result)   Collection Time: 09/29/16  9:30 PM    Result Value Ref Range Status   Specimen Description BLOOD RIGHT ARM  Final   Special Requests BOTTLES DRAWN AEROBIC AND ANAEROBIC 5CC  Final   Culture  Setup Time   Final    GRAM NEGATIVE RODS AEROBIC BOTTLE ONLY CRITICAL VALUE NOTED.  VALUE IS CONSISTENT WITH PREVIOUSLY REPORTED AND CALLED VALUE.    Culture GRAM NEGATIVE RODS  Final   Report Status PENDING  Incomplete  Culture, blood (Routine x 2)     Status: None (Preliminary result)   Collection Time: 09/29/16  9:37 PM  Result Value Ref Range Status   Specimen Description BLOOD LEFT ARM  Final   Special Requests IN PEDIATRIC BOTTLE 4CC  Final   Culture  Setup Time   Final    GRAM NEGATIVE RODS IN PEDIATRIC BOTTLE CRITICAL RESULT CALLED TO, READ BACK BY AND VERIFIED WITH: T STONE,PHARMD AT 1429 09/30/16 BY L BENFIELD    Culture GRAM NEGATIVE RODS  Final   Report Status PENDING  Incomplete  Blood Culture ID Panel (Reflexed)     Status: Abnormal   Collection Time: 09/29/16  9:37 PM  Result Value Ref Range Status   Enterococcus species NOT DETECTED NOT DETECTED Final   Listeria monocytogenes NOT DETECTED NOT DETECTED Final   Staphylococcus species NOT DETECTED NOT DETECTED Final   Staphylococcus aureus NOT DETECTED NOT DETECTED Final   Streptococcus species NOT DETECTED NOT DETECTED Final   Streptococcus agalactiae NOT DETECTED NOT DETECTED Final   Streptococcus pneumoniae NOT DETECTED NOT DETECTED Final   Streptococcus pyogenes NOT DETECTED NOT DETECTED Final   Acinetobacter baumannii NOT DETECTED NOT DETECTED Final   Enterobacteriaceae species DETECTED (A) NOT DETECTED Final    Comment: Enterobacteriaceae represent a large family of gram negative bacteria, not a single organism. Refer to culture for further identification. CRITICAL RESULT CALLED TO, READ BACK BY AND VERIFIED WITH: T STONE,PHARMD AT 1429 09/30/16 BY L BENFIELD    Enterobacter cloacae complex NOT DETECTED NOT DETECTED Final   Escherichia coli NOT DETECTED NOT  DETECTED Final   Klebsiella oxytoca NOT DETECTED NOT DETECTED  Final   Klebsiella pneumoniae NOT DETECTED NOT DETECTED Final   Proteus species NOT DETECTED NOT DETECTED Final   Serratia marcescens NOT DETECTED NOT DETECTED Final   Carbapenem resistance NOT DETECTED NOT DETECTED Final   Haemophilus influenzae NOT DETECTED NOT DETECTED Final   Neisseria meningitidis NOT DETECTED NOT DETECTED Final   Pseudomonas aeruginosa NOT DETECTED NOT DETECTED Final   Candida albicans NOT DETECTED NOT DETECTED Final   Candida glabrata NOT DETECTED NOT DETECTED Final   Candida krusei NOT DETECTED NOT DETECTED Final   Candida parapsilosis NOT DETECTED NOT DETECTED Final   Candida tropicalis NOT DETECTED NOT DETECTED Final  Respiratory Panel by PCR     Status: None   Collection Time: 09/29/16 11:15 PM  Result Value Ref Range Status   Adenovirus NOT DETECTED NOT DETECTED Final   Coronavirus 229E NOT DETECTED NOT DETECTED Final   Coronavirus HKU1 NOT DETECTED NOT DETECTED Final   Coronavirus NL63 NOT DETECTED NOT DETECTED Final   Coronavirus OC43 NOT DETECTED NOT DETECTED Final   Metapneumovirus NOT DETECTED NOT DETECTED Final   Rhinovirus / Enterovirus NOT DETECTED NOT DETECTED Final   Influenza A NOT DETECTED NOT DETECTED Final   Influenza B NOT DETECTED NOT DETECTED Final   Parainfluenza Virus 1 NOT DETECTED NOT DETECTED Final   Parainfluenza Virus 2 NOT DETECTED NOT DETECTED Final   Parainfluenza Virus 3 NOT DETECTED NOT DETECTED Final   Parainfluenza Virus 4 NOT DETECTED NOT DETECTED Final   Respiratory Syncytial Virus NOT DETECTED NOT DETECTED Final   Bordetella pertussis NOT DETECTED NOT DETECTED Final   Chlamydophila pneumoniae NOT DETECTED NOT DETECTED Final   Mycoplasma pneumoniae NOT DETECTED NOT DETECTED Final    RADIOLOGY STUDIES/RESULTS: Dg Chest 2 View  Result Date: 09/29/2016 CLINICAL DATA:  Congestion and fevers beginning this afternoon. History of multiple sclerosis. EXAM: CHEST   2 VIEW COMPARISON:  08/15/2016 FINDINGS: Shallow inspiration with atelectasis or infiltration in the left lung base. Pneumonia is not excluded. No pleural effusions. No pneumothorax. Heart size and pulmonary vascularity are normal. Right lung is clear. Calcification of the aorta. IMPRESSION: Shallow inspiration with atelectasis or infiltration in the left lung base. Pneumonia is not excluded. Electronically Signed   By: Lucienne Capers M.D.   On: 09/29/2016 22:05     LOS: 2 days   Thurnell Lose, MD  [facility] Pager:[phone]  If 7PM-7AM, please contact night-coverage [email or web address] Password [facility] 10/01/2016, 9:48 AM

## 2016-10-01 NOTE — Evaluation (Signed)
Clinical/Bedside Swallow Evaluation Patient Details  Name: Vanessa Short MRN: [id number] Date of Birth: 27-Apr-1966  Today's Date: 10/01/2016 Time: SLP Start Time (ACUTE ONLY): 1324 SLP Stop Time (ACUTE ONLY): 1308 SLP Time Calculation (min) (ACUTE ONLY): 12 min  Past Medical History:  Past Medical History:  Diagnosis Date  . Headache   . Hypertension   . Kidney stones   . Movement disorder   . Multiple sclerosis ([place])   . Neuropathy ([place])   . Ulcerative colitis ([place])   . Vision abnormalities    Past Surgical History:  Past Surgical History:  Procedure Laterality Date  . 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 She was found to have significant leukocytosis, and a chest x-ray suggestive of left sided pneumonia. She was subsequently admitted for further evaluation and treatment. Most recent MRI 08/17/16 showed Multiple white matter lesions, compatible with history of multiple sclerosis. No prior history of oropharyngeal dysphagia per chart, however seen by [facility] with diagnosis of GERD, unspecified dysphagia who recommended soft foods not requiring much chewing (10/2015).   Assessment / Plan / Recommendation Clinical Impression  Patient presents with oropharyngeal swallow which appears within functional limits with adequate airway protection at bedside. No overt signs of aspiration observed. Due to MS, patient requires full assistance for feeding. Administered 3 oz water swallow challenge; patient with multiple consecutive swallows via straw with no overt signs of aspiration. Swallow appears timely, vocal quality clear upon phonation. Patient states she coughs occasionally when she takes larger sips of liquids, which occurs about once a week or  less. Provided education regarding swallow physiology and general aspiration precautions including slow rate, small sips, upright posture. She verbalizes understanding. Patient states she typically takes about 15 medications at a time with "lots of water." Provided education regarding taking medications one at a time, though patient states she feels this is not feasible. Recommended attempting fewer medications whole with bites of pureed texture (yogurt or pudding, as pt dislikes applesauce) should this make swallowing medications easier for her. Recommend regular diet with thin liquids. No further SLP needs identified at this time. SLP will s/o.     Aspiration Risk  No limitations    Diet Recommendation Regular;Thin liquid   Liquid Administration via: Straw Medication Administration: Other (Comment) (whole with liquid or in bites of yogurt/pudding if pt prefer) Supervision: Staff to assist with self feeding Compensations: Slow rate;Small sips/bites Postural Changes: Seated upright at 90 degrees    Other  Recommendations Oral Care Recommendations: Oral care BID   Follow up Recommendations None      Frequency and Duration            Prognosis Prognosis for Safe Diet Advancement: Good      Swallow Study   General Date of Onset: 09/29/16 HPI: Patient is a 51 y.o.femalewith history of spastic quadriplegia secondary to multiple sclerosis (bed/wheelchair bound), ulcerative colitis, hypertension and depression presented to the ED for evaluation of fever, rhinorrhea and worsening shortness of breath. She was found to have significant leukocytosis, and a chest x-ray suggestive of left sided pneumonia. She was subsequently admitted for further evaluation and treatment. Most recent MRI 08/17/16 showed Multiple white matter lesions, compatible with history of multiple sclerosis. No prior history of oropharyngeal dysphagia per chart, however seen by [facility] with diagnosis of GERD, unspecified  dysphagia who recommended soft foods not requiring much chewing (10/2015). Type of Study: Bedside Swallow Evaluation Previous Swallow Assessment: none per chart Diet Prior to this Study: Regular;Thin liquids Temperature Spikes Noted: No Respiratory Status: Room air History of Recent Intubation: No Behavior/Cognition: Alert;Cooperative;Pleasant mood Oral Cavity Assessment: Within Functional Limits Oral Care Completed by SLP: No Oral Cavity - Dentition: Adequate natural dentition Vision: Functional for self-feeding Self-Feeding Abilities: Total assist Patient Positioning: Upright in bed Baseline Vocal Quality: Normal Volitional Cough: Strong Volitional Swallow: Able to elicit    Oral/Motor/Sensory Function Overall Oral Motor/Sensory Function: Within functional limits (noted mild tongue fasiculations)   Ice Chips Ice chips: Within functional limits   Thin Liquid Thin Liquid: Within functional limits Presentation: Straw    Nectar Thick Nectar Thick Liquid: Not tested   Honey Thick Honey Thick Liquid: Not tested   Puree Puree: Within functional limits Presentation: Spoon   Solid   GO   Solid: Within functional limits       Deneise Lever, MS CF-SLP Speech-Language Pathologist [phone]  Aliene Altes 10/01/2016,1:24 PM

## 2016-10-02 DIAGNOSIS — J189 Pneumonia, unspecified organism: Principal | ICD-10-CM

## 2016-10-02 DIAGNOSIS — D649 Anemia, unspecified: Secondary | ICD-10-CM

## 2016-10-02 DIAGNOSIS — I10 Essential (primary) hypertension: Secondary | ICD-10-CM

## 2016-10-02 DIAGNOSIS — G35 Multiple sclerosis: Secondary | ICD-10-CM

## 2016-10-02 LAB — QUANTIFERON IN TUBE
QFT TB AG MINUS NIL VALUE: 0.02 IU/mL
QUANTIFERON MITOGEN VALUE: 6.33 IU/mL
QUANTIFERON TB AG VALUE: 0.06 [IU]/mL
QUANTIFERON TB GOLD: NEGATIVE
Quantiferon Nil Value: 0.04 IU/mL

## 2016-10-02 LAB — CULTURE, BLOOD (ROUTINE X 2)

## 2016-10-02 LAB — URINE CULTURE

## 2016-10-02 LAB — QUANTIFERON TB GOLD ASSAY (BLOOD)

## 2016-10-02 MED ORDER — TERIFLUNOMIDE 14 MG PO TABS
1.0000 | ORAL_TABLET | Freq: Every day | ORAL | 4 refills | Status: DC
Start: 2016-10-05 — End: 2017-04-05

## 2016-10-02 MED ORDER — CEFPODOXIME PROXETIL 200 MG PO TABS: 200.0000 mg | ORAL_TABLET | Freq: Two times a day (BID) | ORAL | 0 refills | Status: DC

## 2016-10-02 NOTE — Discharge Instructions (Signed)
Follow with Primary MD [place] in 7 days   Get CBC, CMP, 2 view Chest X ray checked  by Primary MD or SNF MD in 5-7 days ( we routinely change or add medications that can affect your baseline labs and fluid status, therefore we recommend that you get the mentioned basic workup next visit with your PCP, your PCP may decide not to get them or add new tests based on their clinical decision)  Activity: As tolerated with Full fall precautions use walker/cane & assistance as needed  Disposition Home    Diet:   Diet Heart Healthy with Fluid restriction: 1500 mL Fluid/ day   For Heart failure patients - Check your Weight same time everyday, if you gain over 2 pounds, or you develop in leg swelling, experience more shortness of breath or chest pain, call your Primary MD immediately. Follow Cardiac Low Salt Diet and 1.5 lit/day fluid restriction.  On your next visit with your primary care physician please Get Medicines reviewed and adjusted.  Please request your Prim.MD to go over all Hospital Tests and Procedure/Radiological results at the follow up, please get all Hospital records sent to your Prim MD by signing hospital release before you go home.  If you experience worsening of your admission symptoms, develop shortness of breath, life threatening emergency, suicidal or homicidal thoughts you must seek medical attention immediately by calling 911 or calling your MD immediately  if symptoms less severe.  You Must read complete instructions/literature along with all the possible adverse reactions/side effects for all the Medicines you take and that have been prescribed to you. Take any new Medicines after you have completely understood and accpet all the possible adverse reactions/side effects.   Do not drive, operate heavy machinery, perform activities at heights, swimming or participation in water activities or provide baby sitting services if your were admitted for  syncope or siezures until you have seen by Primary MD or a Neurologist and advised to do so again.  Do not drive when taking Pain medications.    Do not take more than prescribed Pain, Sleep and Anxiety Medications  Special Instructions: If you have smoked or chewed Tobacco  in the last 2 yrs please stop smoking, stop any regular Alcohol  and or any Recreational drug use.  Wear Seat belts while driving.   Please note  You were cared for by a hospitalist during your hospital stay. If you have any questions about your discharge medications or the care you received while you were in the hospital after you are discharged, you can call the unit and asked to speak with the hospitalist on call if the hospitalist that took care of you is not available. Once you are discharged, your primary care physician will handle any further medical issues. Please note that NO REFILLS for any discharge medications will be authorized once you are discharged, as it is imperative that you return to your primary care physician (or establish a relationship with a primary care physician if you do not have one) for your aftercare needs so that they can reassess your need for medications and monitor your lab values.

## 2016-10-02 NOTE — Progress Notes (Signed)
Vanessa Short to be D/C'd Home per MD order.  Discussed with the patient and all questions fully answered.  VSS, Skin clean, dry and intact without evidence of skin break down, no evidence of skin tears noted. IV catheter discontinued intact. Site without signs and symptoms of complications. Dressing and pressure applied.  An After Visit Summary was printed and given to the patient. Patient received prescription.  D/c education completed with patient/family including follow up instructions, medication list, d/c activities limitations if indicated, with other d/c instructions as indicated by MD - patient able to verbalize understanding, all questions fully answered.   Patient instructed to return to ED, call 911, or call MD for any changes in condition.   Patient escorted via [place], and D/C home via private auto.  Dorris Carnes 10/02/2016 11:55 AM

## 2016-10-02 NOTE — Discharge Summary (Signed)
Vanessa Short BFX:[id number] DOB: 01-Feb-1966 DOA: 09/29/2016  PCP: [place] date: 09/29/2016  Discharge date: 10/02/2016  Admitted From: Home  Disposition:  Home   Recommendations for Outpatient Follow-up:   Follow up with PCP in 1-2 weeks  PCP Please obtain BMP/CBC, 2 view CXR in 1week,  (see Discharge instructions)   PCP Please follow up on the following pending results: Monitor QTC check EKG next visit, she is on multiple depression medications that can prolong QTC   Home Health: PT,RN   Equipment/Devices: None  Consultations: None Discharge Condition: Stable   CODE STATUS: Full   Diet Recommendation: Diet Heart Healthy with Fluid restriction: 1500 mL Fluid/ day    Chief Complaint  Patient presents with  . Fever     Brief history of present illness from the day of admission and additional interim summary    Patient is a 52 y.o. 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 She was found to have significant leukocytosis, and a chest x-ray suggestive of left sided pneumonia. She was subsequently admitted for further evaluation and treatment.                                                                 Hospital Course    Enterobacter bacteremia. Source thought to be pneumonia, although UA also look suspicious but urine culture inconclusive, does not have history of neurogenic bladder. She was seen and cleared by speech, responded well to empiric IV antibiotics and eventually transitioned to third generation cephalosporin with good effect, complete a total of 10 day course with  oral Vantin. Follow with PCP in a week. Request PCP to check CBC, BMP and a 2 view chest x-ray next visit.  Hypokalemia. Replaced and stable.  Normocytic anemia: AOCD + dilution.  Prolonged QT interval:-QTc 511 ms on admission- likely secondary to hypokalemia; QTC now down to 490 with potassium replacement, request PCP to monitor her depression medications which can cause prolonged QTC as well.  Chronic diastolic heart failure: Baseline weight 175 pounds, compensated, resume home dose diuretics upon discharge.  History of Multiple sclerosis with spastic quadriplegia: Followed by neurology as outpatient-hold teriflunomide given bacteremia for 3 more days, continue with dantrolene and baclofen  Hypertension:BP is stable resumed home labetalol.   Ulcerative colitis: Stable-continue mesalamine.  Depression: Appears to be stable.Continue home regimen, request PCP to check EKG for QTC evaluation next visit.   Discharge diagnosis     Principal Problem:   Left lower lobe pneumonia ([place]) Active Problems:   Multiple sclerosis (HCC)   Spastic quadriparesis (HCC)   Hypokalemia   Normocytic anemia   Ulcerative colitis ([place])  Essential hypertension   MDD (major depressive disorder), recurrent severe, without psychosis ([place])   SIRS (systemic inflammatory response syndrome) (HCC)   HCAP (healthcare-associated pneumonia)   Chronic diastolic CHF (congestive heart failure) (HCC)   Prolonged QT interval    Discharge instructions    Discharge Instructions    Discharge instructions    Complete by:  As directed    Follow with Primary MD [place] in 7 days   Get CBC, CMP, 2 view Chest X ray checked  by Primary MD or SNF MD in 5-7 days ( we routinely change or add medications that can affect your baseline labs and fluid status, therefore we recommend that you get the mentioned basic workup next visit with your PCP, your PCP may decide not to get them or  add new tests based on their clinical decision)  Activity: As tolerated with Full fall precautions use walker/cane & assistance as needed  Disposition Home    Diet:   Diet Heart Healthy with Fluid restriction: 1500 mL Fluid/ day   For Heart failure patients - Check your Weight same time everyday, if you gain over 2 pounds, or you develop in leg swelling, experience more shortness of breath or chest pain, call your Primary MD immediately. Follow Cardiac Low Salt Diet and 1.5 lit/day fluid restriction.  On your next visit with your primary care physician please Get Medicines reviewed and adjusted.  Please request your Prim.MD to go over all Hospital Tests and Procedure/Radiological results at the follow up, please get all Hospital records sent to your Prim MD by signing hospital release before you go home.  If you experience worsening of your admission symptoms, develop shortness of breath, life threatening emergency, suicidal or homicidal thoughts you must seek medical attention immediately by calling 911 or calling your MD immediately  if symptoms less severe.  You Must read complete instructions/literature along with all the possible adverse reactions/side effects for all the Medicines you take and that have been prescribed to you. Take any new Medicines after you have completely understood and accpet all the possible adverse reactions/side effects.   Do not drive, operate heavy machinery, perform activities at heights, swimming or participation in water activities or provide baby sitting services if your were admitted for syncope or siezures until you have seen by Primary MD or a Neurologist and advised to do so again.  Do not drive when taking Pain medications.    Do not take more than prescribed Pain, Sleep and Anxiety Medications  Special Instructions: If you have smoked or chewed Tobacco  in the last 2 yrs please stop smoking, stop any regular Alcohol  and or any Recreational drug  use.  Wear Seat belts while driving.   Please note  You were cared for by a hospitalist during your hospital stay. If you have any questions about your discharge medications or the care you received while you were in the hospital after you are discharged, you can call the unit and asked to speak with the hospitalist on call if the hospitalist that took care of you is not available. Once you are discharged, your primary care physician will handle any further medical issues. Please note that NO REFILLS for any discharge medications will be authorized once you are discharged, as it is imperative that you return to your primary care physician (or establish a relationship with a primary care physician if you do not have one) for your aftercare needs so that they can  reassess your need for medications and monitor your lab values.   Increase activity slowly    Complete by:  As directed       Discharge Medications   Allergies as of 10/02/2016      Reactions   Oxycodone-acetaminophen Other (See Comments)   Other   Penicillins Rash   Has patient had a PCN reaction causing immediate rash, facial/tongue/throat swelling, SOB or lightheadedness with hypotension: NO Has patient had a PCN reaction causing severe rash involving mucus membranes or skin necrosis: NO Has patient had a PCN reaction that required hospitalization NO Has patient had a PCN reaction occurring within the last 10 years:NO If all of the above answers are "NO", then may proceed with Cephalosporin use.      Medication List    TAKE these medications   acetaminophen 500 MG tablet Commonly known as:  TYLENOL Take 500 mg by mouth every 6 (six) hours as needed for mild pain or moderate pain.   acyclovir 400 MG tablet Commonly known as:  ZOVIRAX Take 400 mg by mouth daily. Take every day per patient   amitriptyline 25 MG tablet Commonly known as:  ELAVIL Take 1 tablet (25 mg total) by mouth at bedtime.   atorvastatin 20 MG  tablet Commonly known as:  LIPITOR Take 1 tablet (20 mg total) by mouth daily at 6 PM.   b complex vitamins tablet Take 1 tablet by mouth daily.   baclofen 10 MG tablet Commonly known as:  LIORESAL Take 10 mg by mouth 3 (three) times daily.   baclofen 10 MG tablet Commonly known as:  LIORESAL Take 1.5 tablets (15 mg total) by mouth at bedtime.   busPIRone 15 MG tablet Commonly known as:  BUSPAR Take 1 tablet (15 mg total) by mouth 2 (two) times daily.   cefpodoxime 200 MG tablet Commonly known as:  VANTIN Take 1 tablet (200 mg total) by mouth 2 (two) times daily.   cholecalciferol 1000 units tablet Commonly known as:  VITAMIN D Take 1,000 Units by mouth daily.   dantrolene 50 MG capsule Commonly known as:  DANTRIUM Take 50 mg by mouth 3 (three) times daily.   FLUoxetine 40 MG capsule Commonly known as:  PROZAC Take 1 capsule (40 mg total) by mouth daily.   FOLIC ACID PO Take 138 mcg by mouth daily.   furosemide 40 MG tablet Commonly known as:  LASIX Take 1 tablet (40 mg total) by mouth 2 (two) times daily.   ibuprofen 200 MG tablet Commonly known as:  ADVIL,MOTRIN Take 200 mg by mouth every 6 (six) hours as needed for moderate pain.   labetalol 100 MG tablet Commonly known as:  NORMODYNE Take 100 mg by mouth daily.   lamoTRIgine 200 MG tablet Commonly known as:  LAMICTAL Take 1 tablet (200 mg total) by mouth 3 (three) times daily.   multivitamin tablet Take 1 tablet by mouth daily.   NUTRITIONAL SUPPLEMENT Liqd Take 120 mLs by mouth 2 (two) times daily. [employer]   OXcarbazepine 150 MG tablet Commonly known as:  TRILEPTAL Take 1 tablet (150 mg total) by mouth 3 (three) times daily.   PENTASA 500 MG CR capsule Generic drug:  mesalamine Take 1,000 mg by mouth 3 (three) times daily.   polyethylene glycol packet Commonly known as:  MIRALAX / GLYCOLAX Take 17 g by mouth daily. What changed:  when to take this  reasons to take this   sodium  chloride 0.65 % Soln nasal spray Commonly  known as:  OCEAN Place 1 spray into both nostrils as needed for congestion.   solifenacin 10 MG tablet Commonly known as:  VESICARE Take 10 mg by mouth daily.   Teriflunomide 14 MG Tabs Commonly known as:  AUBAGIO Take 1 tablet by mouth daily. Start taking on:  10/05/2016   zinc oxide 20 % ointment Apply 1 application topically 3 (three) times daily. Apply to bilateral buttocks every shift as a preventative measure       Follow-up Information    [place]. Schedule an appointment as soon as possible for a visit in 1 week(s).   Specialty:  Family Medicine Contact information: [address] [place] [address] [phone]           Major procedures and Radiology Reports - PLEASE review detailed and final reports thoroughly  -      Dg Chest 2 View  Result Date: 09/29/2016 CLINICAL DATA:  Congestion and fevers beginning this afternoon. History of multiple sclerosis. EXAM: CHEST  2 VIEW COMPARISON:  08/15/2016 FINDINGS: Shallow inspiration with atelectasis or infiltration in the left lung base. Pneumonia is not excluded. No pleural effusions. No pneumothorax. Heart size and pulmonary vascularity are normal. Right lung is clear. Calcification of the aorta. IMPRESSION: Shallow inspiration with atelectasis or infiltration in the left lung base. Pneumonia is not excluded. Electronically Signed   By: Lucienne Capers M.D.   On: 09/29/2016 22:05    Micro Results     Recent Results (from the past 240 hour(s))  Culture, blood (Routine x 2)     Status: Abnormal   Collection Time: 09/29/16  9:30 PM  Result Value Ref Range Status   Specimen Description BLOOD RIGHT ARM  Final   Special Requests BOTTLES DRAWN AEROBIC AND ANAEROBIC 5CC  Final   Culture  Setup Time   Final    GRAM NEGATIVE RODS AEROBIC BOTTLE ONLY CRITICAL VALUE NOTED.  VALUE IS CONSISTENT WITH PREVIOUSLY REPORTED AND CALLED VALUE.     Culture (A)  Final    ENTEROBACTER AEROGENES SUSCEPTIBILITIES PERFORMED ON PREVIOUS CULTURE WITHIN THE LAST 5 DAYS.    Report Status 10/02/2016 FINAL  Final  Culture, blood (Routine x 2)     Status: Abnormal   Collection Time: 09/29/16  9:37 PM  Result Value Ref Range Status   Specimen Description BLOOD LEFT ARM  Final   Special Requests IN PEDIATRIC BOTTLE 4CC  Final   Culture  Setup Time   Final    GRAM NEGATIVE RODS IN PEDIATRIC BOTTLE CRITICAL RESULT CALLED TO, READ BACK BY AND VERIFIED WITH: T STONE,PHARMD AT 1429 09/30/16 BY L BENFIELD    Culture ENTEROBACTER AEROGENES (A)  Final   Report Status 10/02/2016 FINAL  Final   Organism ID, Bacteria ENTEROBACTER AEROGENES  Final      Susceptibility   Enterobacter aerogenes - MIC*    CEFAZOLIN >=64 RESISTANT Resistant     CEFEPIME <=1 SENSITIVE Sensitive     CEFTAZIDIME <=1 SENSITIVE Sensitive     CEFTRIAXONE <=1 SENSITIVE Sensitive     CIPROFLOXACIN <=0.25 SENSITIVE Sensitive     GENTAMICIN <=1 SENSITIVE Sensitive     IMIPENEM 0.5 SENSITIVE Sensitive     TRIMETH/SULFA <=20 SENSITIVE Sensitive     PIP/TAZO <=4 SENSITIVE Sensitive     * ENTEROBACTER AEROGENES  Blood Culture ID Panel (Reflexed)     Status: Abnormal   Collection Time: 09/29/16  9:37 PM  Result Value Ref Range Status  Enterococcus species NOT DETECTED NOT DETECTED Final   Listeria monocytogenes NOT DETECTED NOT DETECTED Final   Staphylococcus species NOT DETECTED NOT DETECTED Final   Staphylococcus aureus NOT DETECTED NOT DETECTED Final   Streptococcus species NOT DETECTED NOT DETECTED Final   Streptococcus agalactiae NOT DETECTED NOT DETECTED Final   Streptococcus pneumoniae NOT DETECTED NOT DETECTED Final   Streptococcus pyogenes NOT DETECTED NOT DETECTED Final   Acinetobacter baumannii NOT DETECTED NOT DETECTED Final   Enterobacteriaceae species DETECTED (A) NOT DETECTED Final    Comment: Enterobacteriaceae represent a large family of gram negative  bacteria, not a single organism. Refer to culture for further identification. CRITICAL RESULT CALLED TO, READ BACK BY AND VERIFIED WITH: T STONE,PHARMD AT 1429 09/30/16 BY L BENFIELD    Enterobacter cloacae complex NOT DETECTED NOT DETECTED Final   Escherichia coli NOT DETECTED NOT DETECTED Final   Klebsiella oxytoca NOT DETECTED NOT DETECTED Final   Klebsiella pneumoniae NOT DETECTED NOT DETECTED Final   Proteus species NOT DETECTED NOT DETECTED Final   Serratia marcescens NOT DETECTED NOT DETECTED Final   Carbapenem resistance NOT DETECTED NOT DETECTED Final   Haemophilus influenzae NOT DETECTED NOT DETECTED Final   Neisseria meningitidis NOT DETECTED NOT DETECTED Final   Pseudomonas aeruginosa NOT DETECTED NOT DETECTED Final   Candida albicans NOT DETECTED NOT DETECTED Final   Candida glabrata NOT DETECTED NOT DETECTED Final   Candida krusei NOT DETECTED NOT DETECTED Final   Candida parapsilosis NOT DETECTED NOT DETECTED Final   Candida tropicalis NOT DETECTED NOT DETECTED Final  Respiratory Panel by PCR     Status: None   Collection Time: 09/29/16 11:15 PM  Result Value Ref Range Status   Adenovirus NOT DETECTED NOT DETECTED Final   Coronavirus 229E NOT DETECTED NOT DETECTED Final   Coronavirus HKU1 NOT DETECTED NOT DETECTED Final   Coronavirus NL63 NOT DETECTED NOT DETECTED Final   Coronavirus OC43 NOT DETECTED NOT DETECTED Final   Metapneumovirus NOT DETECTED NOT DETECTED Final   Rhinovirus / Enterovirus NOT DETECTED NOT DETECTED Final   Influenza A NOT DETECTED NOT DETECTED Final   Influenza B NOT DETECTED NOT DETECTED Final   Parainfluenza Virus 1 NOT DETECTED NOT DETECTED Final   Parainfluenza Virus 2 NOT DETECTED NOT DETECTED Final   Parainfluenza Virus 3 NOT DETECTED NOT DETECTED Final   Parainfluenza Virus 4 NOT DETECTED NOT DETECTED Final   Respiratory Syncytial Virus NOT DETECTED NOT DETECTED Final   Bordetella pertussis NOT DETECTED NOT DETECTED Final    Chlamydophila pneumoniae NOT DETECTED NOT DETECTED Final   Mycoplasma pneumoniae NOT DETECTED NOT DETECTED Final  Urine culture     Status: Abnormal   Collection Time: 10/01/16 11:13 AM  Result Value Ref Range Status   Specimen Description URINE, CATHETERIZED  Final   Special Requests NONE  Final   Culture MULTIPLE SPECIES PRESENT, SUGGEST RECOLLECTION (A)  Final   Report Status 10/02/2016 FINAL  Final    Today   Subjective    Vanessa Short today has no headache,no chest abdominal pain,no new weakness tingling or numbness, feels much better wants to go home today.     Objective   Blood pressure 128/69, pulse 91, temperature 98.3 F (36.8 C), temperature source Oral, resp. rate 18, height 5' 3"  (1.6 m), weight 78.6 kg (173 lb 4.5 oz), SpO2 98 %.   Intake/Output Summary (Last 24 hours) at 10/02/16 0935 Last data filed at 10/02/16 0619  Gross per 24 hour  Intake  770 ml  Output                0 ml  Net              770 ml    Exam Awake Alert, Oriented x 3, No new F.N deficits, Chronic functional quadriparesis due to MS, Normal affect [place].AT,PERRAL Supple Neck,No JVD, No cervical lymphadenopathy appriciated.  Symmetrical Chest wall movement, Good air movement bilaterally, CTAB RRR,No Gallops,Rubs or new Murmurs, No Parasternal Heave +ve B.Sounds, Abd Soft, Non tender, No organomegaly appriciated, No rebound -guarding or rigidity. No Cyanosis, Clubbing or edema, No new Rash or bruise   Data Review   CBC w Diff: Lab Results  Component Value Date   WBC 13.3 (H) 10/01/2016   HGB 10.4 (L) 10/01/2016   HCT 33.0 (L) 10/01/2016   HCT 32.6 (L) 09/27/2016   PLT 210 10/01/2016   PLT 232 09/27/2016   LYMPHOPCT 10 09/30/2016   MONOPCT 15 09/30/2016   EOSPCT 1 09/30/2016   BASOPCT 0 09/30/2016    CMP: Lab Results  Component Value Date   NA 141 10/01/2016   NA 142 08/24/2016   K 3.8 10/01/2016   CL 104 10/01/2016   CO2 26 10/01/2016   BUN 9 10/01/2016   BUN  17 08/24/2016   CREATININE 0.72 10/01/2016   GLU 98 08/24/2016   PROT 6.2 (L) 09/29/2016   PROT 6.4 11/09/2015   ALBUMIN 3.4 (L) 09/29/2016   ALBUMIN 4.0 11/09/2015   BILITOT 0.9 09/29/2016   BILITOT 0.4 11/09/2015   ALKPHOS 84 09/29/2016   AST 22 09/29/2016   ALT 22 09/29/2016  .   Total Time in preparing paper work, data evaluation and todays exam - 35 minutes  Thurnell Lose M.D on 10/02/2016 at 9:35 AM  [facility]   Office  [phone]

## 2016-10-03 ENCOUNTER — Telehealth: Payer: Self-pay | Admitting: *Deleted

## 2016-10-03 NOTE — Telephone Encounter (Signed)
-----   Message from Britt Bottom, MD sent at 10/03/2016  8:55 AM EST ----- Labs are fine .   We can send in the ocrelizumab form

## 2016-10-03 NOTE — Telephone Encounter (Signed)
Duplicate--see 3/40/35 phone note/fim

## 2016-10-08 ENCOUNTER — Other Ambulatory Visit: Payer: Self-pay | Admitting: Adult Health

## 2016-10-09 ENCOUNTER — Other Ambulatory Visit: Payer: Self-pay | Admitting: Neurology

## 2016-10-11 ENCOUNTER — Emergency Department (HOSPITAL_COMMUNITY): Payer: Commercial Managed Care - PPO

## 2016-10-11 ENCOUNTER — Emergency Department (HOSPITAL_COMMUNITY)
Admission: EM | Admit: 2016-10-11 | Discharge: 2016-10-11 | Disposition: A | Discharge status: Home / Self Care | Payer: Commercial Managed Care - PPO | Attending: Emergency Medicine | Admitting: Emergency Medicine

## 2016-10-11 ENCOUNTER — Encounter (HOSPITAL_COMMUNITY): Payer: Self-pay | Admitting: *Deleted

## 2016-10-11 DIAGNOSIS — R4789 Other speech disturbances: Secondary | ICD-10-CM | POA: Diagnosis present

## 2016-10-11 DIAGNOSIS — I5032 Chronic diastolic (congestive) heart failure: Secondary | ICD-10-CM | POA: Diagnosis not present

## 2016-10-11 DIAGNOSIS — Z79899 Other long term (current) drug therapy: Secondary | ICD-10-CM | POA: Diagnosis not present

## 2016-10-11 DIAGNOSIS — R531 Weakness: Secondary | ICD-10-CM | POA: Diagnosis not present

## 2016-10-11 DIAGNOSIS — I110 Hypertensive heart disease with heart failure: Secondary | ICD-10-CM | POA: Diagnosis not present

## 2016-10-11 DIAGNOSIS — R479 Unspecified speech disturbances: Secondary | ICD-10-CM

## 2016-10-11 LAB — URINALYSIS, ROUTINE W REFLEX MICROSCOPIC
BILIRUBIN URINE: NEGATIVE
GLUCOSE, UA: NEGATIVE mg/dL
KETONES UR: NEGATIVE mg/dL
NITRITE: NEGATIVE
PH: 5 (ref 5.0–8.0)
PROTEIN: 30 mg/dL — AB
Specific Gravity, Urine: 1.017 (ref 1.005–1.030)

## 2016-10-11 LAB — BASIC METABOLIC PANEL
ANION GAP: 12 (ref 5–15)
BUN: 8 mg/dL (ref 6–20)
CHLORIDE: 102 mmol/L (ref 101–111)
CO2: 31 mmol/L (ref 22–32)
Calcium: 9.6 mg/dL (ref 8.9–10.3)
Creatinine, Ser: 0.88 mg/dL (ref 0.44–1.00)
GFR calc non Af Amer: >60 mL/min (ref >60)
GLUCOSE: 121 mg/dL — AB (ref 65–99)
Potassium: 3.1 mmol/L — ABNORMAL LOW (ref 3.5–5.1)
Sodium: 145 mmol/L (ref 135–145)

## 2016-10-11 LAB — CBC
HEMATOCRIT: 35 % — AB (ref 36.0–46.0)
HEMOGLOBIN: 10.7 g/dL — AB (ref 12.0–15.0)
MCH: 30.8 pg (ref 26.0–34.0)
MCHC: 30.6 g/dL (ref 30.0–36.0)
MCV: 100.9 fL — ABNORMAL HIGH (ref 78.0–100.0)
Platelets: 257 10*3/uL (ref 150–400)
RBC: 3.47 MIL/uL — ABNORMAL LOW (ref 3.87–5.11)
RDW: 17.3 % — AB (ref 11.5–15.5)
WBC: 10 10*3/uL (ref 4.0–10.5)

## 2016-10-11 MED ORDER — GADOBENATE DIMEGLUMINE 529 MG/ML IV SOLN
20.0000 mL | Freq: Once | INTRAVENOUS | Status: AC
  Administered 2016-10-11: 16 mL via INTRAVENOUS

## 2016-10-11 MED ORDER — DIAZEPAM 5 MG PO TABS: 5.0000 mg | ORAL_TABLET | Freq: Three times a day (TID) | ORAL | Status: DC | PRN

## 2016-10-11 MED ORDER — DIAZEPAM 2 MG PO TABS
2.0000 mg | ORAL_TABLET | Freq: Four times a day (QID) | ORAL | Status: DC | PRN
  Administered 2016-10-11: 2 mg via ORAL
  Filled 2016-10-11: qty 1

## 2016-10-11 MED ORDER — LORAZEPAM 2 MG/ML IJ SOLN: 1.0000 mg | INTRAMUSCULAR | Status: DC | PRN

## 2016-10-11 MED ORDER — LORAZEPAM 2 MG/ML IJ SOLN
1.0000 mg | Freq: Once | INTRAMUSCULAR | Status: AC
  Administered 2016-10-11: 1 mg via INTRAVENOUS
  Filled 2016-10-11: qty 1

## 2016-10-11 NOTE — ED Triage Notes (Signed)
Pt and family member reports onset yesterday of difficulty speaking. Pt went to pcp office yesterday. Also had recent pneumonia diagnosis two weeks ago. This am, pt was trying to speak but confused per family. Also had episodes of low spo2 at home last night. Denies recent cough or fever. Pt has hx of MS.

## 2016-10-11 NOTE — ED Notes (Signed)
Pt to xray

## 2016-10-11 NOTE — ED Notes (Signed)
Husband states patient has had MS for 28 years.  Unable to use arms and legs. Wheelchair bound.

## 2016-10-11 NOTE — ED Notes (Signed)
Unable to assess neuro status due to patient being asleep.

## 2016-10-11 NOTE — Discharge Instructions (Signed)
Please read and follow all provided instructions.  Your diagnoses today include:  1. Other fatigue    Tests performed today include: Vital signs. See below for your results today.   Medications prescribed:  Take as prescribed   Home care instructions:  Follow any educational materials contained in this packet.  Follow-up instructions: Please follow-up with your primary care provider for further evaluation of symptoms and treatment   Return instructions:  Please return to the Emergency Department if you do not get better, if you get worse, or new symptoms OR  - Fever (temperature greater than 101.35F)  - Bleeding that does not stop with holding pressure to the area    -Severe pain (please note that you may be more sore the day after your accident)  - Chest Pain  - Difficulty breathing  - Severe nausea or vomiting  - Inability to tolerate food and liquids  - Passing out  - Skin becoming red around your wounds  - Change in mental status (confusion or lethargy)  - New numbness or weakness    Please return if you have any other emergent concerns.  Additional Information:  Your vital signs today were: BP 107/62 (BP Location: Right Arm)    Pulse 71    Temp 97.9 F (36.6 C) (Oral)    Resp 17    Ht 5' 3"  (1.6 m)    Wt 79.4 kg    SpO2 99%    BMI 31.00 kg/m  If your blood pressure (BP) was elevated above 135/85 this visit, please have this repeated by your doctor within one month. ---------------

## 2016-10-11 NOTE — ED Provider Notes (Signed)
[facility] DEPT Provider Note   CSN: [id number] Arrival date & time: 10/11/16  9528     History   Chief Complaint Chief Complaint  Patient presents with  . Altered Mental Status    HPI Vanessa Short is a 51 y.o. female.  HPI  51 y.o. 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 Pt recently diagnosed with PNA x 2 weeks ago that was HAP. Per chart review, pt responded well to empiric IV ABX and as transitioned to 3rd Gen Cephalosporin. Completed course of 10 day Vantin. Follow up with PCP yesterday. Noted confusion at visit with thought to be Buspar related. Told to withhold medication until further evaluation. This AM, pt appeared confused per family with intermittent episodes of low O2 saturations in 80s. Noted confusion last night as well. No recent cough or fever. No CP/SOB/ABD pain. Per family, confusion more slowed speech. Pt follows commands well, but has trouble answering questions fluidly. No active pain. No other symptoms noted.   Neurologist- Dr Felecia Shelling  Past Medical History:  Diagnosis Date  . Headache   . Hypertension   . Kidney stones   . Movement disorder   . Multiple sclerosis ([place])   . Neuropathy ([place])   . Ulcerative colitis ([place])   . Vision abnormalities     Patient Active Problem List   Diagnosis Date Noted  . Chronic diastolic CHF (congestive heart failure) ([place]) 09/30/2016  . Prolonged QT interval   . SIRS (systemic inflammatory response syndrome) ([place]) 09/29/2016  . HCAP (healthcare-associated pneumonia) 09/29/2016  . High risk medication use 09/27/2016  . Acute encephalopathy 08/22/2016  . MDD (major depressive disorder), recurrent severe, without psychosis ([place]) 08/16/2016  . Altered mental state   . Acute respiratory failure with hypoxia ([place])  08/07/2016  . CAP (community acquired pneumonia) 08/07/2016  . Left lower lobe pneumonia ([place]) 08/05/2016  . Hypokalemia 08/05/2016  . Normocytic anemia 08/05/2016  . Slurred speech 08/05/2016  . Ulcerative colitis ([place]) 08/05/2016  . Essential hypertension 08/05/2016  . Subacromial bursitis 05/09/2016  . Visual hallucination 03/14/2016  . Other fatigue 11/09/2015  . Urinary disorder 11/09/2015  . Ear ache 10/13/2015  . Sore throat 10/13/2015  . Hand weakness 01/06/2015  . Multiple sclerosis ([place]) 09/07/2014  . Spastic quadriparesis ([place]) 09/07/2014  . Dysesthesia 09/07/2014  . Depression 09/07/2014    Past Surgical History:  Procedure Laterality Date  . KIDNEY STONE SURGERY      OB History    No data available       Home Medications    Prior to Admission medications   Medication Sig Start Date End Date Taking? Authorizing Provider  acetaminophen (TYLENOL) 500 MG tablet Take 500 mg by mouth every 6 (six) hours as needed for mild pain or moderate pain.    Historical Provider, MD  acyclovir (ZOVIRAX) 400 MG tablet Take 400 mg by mouth daily. Take every day per patient 08/11/14   Historical Provider, MD  amitriptyline (ELAVIL) 25 MG tablet Take 1 tablet (25 mg total) by mouth at bedtime. 09/27/16   Britt Bottom, MD  atorvastatin (LIPITOR) 20 MG tablet Take 1 tablet (20 mg total) by mouth daily at 6 PM. 08/19/16   Ripudeep K Rai, MD  b complex vitamins tablet Take 1 tablet by mouth daily.     Historical Provider, MD  baclofen (LIORESAL) 10 MG  tablet Take 1.5 tablets (15 mg total) by mouth at bedtime. 08/19/16   Ripudeep Krystal Eaton, MD  baclofen (LIORESAL) 10 MG tablet Take 10 mg by mouth 3 (three) times daily.    Historical Provider, MD  busPIRone (BUSPAR) 15 MG tablet Take 1 tablet (15 mg total) by mouth 2 (two) times daily. 09/27/16   Britt Bottom, MD  cefpodoxime (VANTIN) 200 MG tablet Take 1 tablet (200 mg total) by mouth 2 (two) times daily. 10/02/16   Thurnell Lose, MD    cholecalciferol (VITAMIN D) 1000 UNITS tablet Take 1,000 Units by mouth daily.     Historical Provider, MD  dantrolene (DANTRIUM) 50 MG capsule Take 50 mg by mouth 3 (three) times daily.    Historical Provider, MD  FLUoxetine (PROZAC) 40 MG capsule Take 1 capsule (40 mg total) by mouth daily. 09/07/14   Britt Bottom, MD  FOLIC ACID PO Take 283 mcg by mouth daily.     Historical Provider, MD  furosemide (LASIX) 40 MG tablet Take 1 tablet (40 mg total) by mouth 2 (two) times daily. 08/19/16   Ripudeep Krystal Eaton, MD  ibuprofen (ADVIL,MOTRIN) 200 MG tablet Take 200 mg by mouth every 6 (six) hours as needed for moderate pain.    Historical Provider, MD  labetalol (NORMODYNE) 100 MG tablet Take 100 mg by mouth daily.    Historical Provider, MD  lamoTRIgine (LAMICTAL) 200 MG tablet Take 1 tablet (200 mg total) by mouth 3 (three) times daily. 05/09/16   Britt Bottom, MD  mesalamine (PENTASA) 500 MG CR capsule Take 1,000 mg by mouth 3 (three) times daily.     Historical Provider, MD  Multiple Vitamin (MULTIVITAMIN) tablet Take 1 tablet by mouth daily.    Historical Provider, MD  NUTRITIONAL SUPPLEMENT LIQD Take 120 mLs by mouth 2 (two) times daily. [employer]    Historical Provider, MD  OXcarbazepine (TRILEPTAL) 150 MG tablet TAKE 1 TABLET 3 TIMES DAILY 10/09/16   Britt Bottom, MD  polyethylene glycol (MIRALAX / GLYCOLAX) packet Take 17 g by mouth daily. Patient taking differently: Take 17 g by mouth daily as needed for mild constipation.  08/19/16   Ripudeep Krystal Eaton, MD  sodium chloride (OCEAN) 0.65 % SOLN nasal spray Place 1 spray into both nostrils as needed for congestion. 08/19/16   Ripudeep Krystal Eaton, MD  solifenacin (VESICARE) 10 MG tablet Take 10 mg by mouth daily.     Historical Provider, MD  Teriflunomide (AUBAGIO) 14 MG TABS Take 1 tablet by mouth daily. 10/05/16   Thurnell Lose, MD  zinc oxide 20 % ointment Apply 1 application topically 3 (three) times daily. Apply to bilateral buttocks every shift as a  preventative measure    Historical Provider, MD    Family History Family History  Problem Relation Age of Onset  . Dementia Mother   . Hypertension Father   . Hyperlipidemia Father   . Diabetes Father   . Heart disease Father     Social History Social History  Substance Use Topics  . Smoking status: Never Smoker  . Smokeless tobacco: Never Used  . Alcohol use No     Allergies   Oxycodone-acetaminophen and Penicillins   Review of Systems Review of Systems ROS reviewed and all are negative for acute change except as noted in the HPI.  Physical Exam Updated Vital Signs BP 149/76 (BP Location: Left Arm)   Pulse 89   Temp 97.9 F (36.6 C) (Oral)  Resp 16   Ht 5' 3"  (1.6 m)   Wt 79.4 kg   SpO2 100%   BMI 31.00 kg/m   Physical Exam  Constitutional: She is oriented to person, place, and time. Vital signs are normal. She appears well-developed and well-nourished.  HENT:  Head: Normocephalic and atraumatic.  Right Ear: Hearing normal.  Left Ear: Hearing normal.  Eyes: Conjunctivae and EOM are normal. Pupils are equal, round, and reactive to light.  Neck: Normal range of motion. Neck supple.  Cardiovascular: Normal rate, regular rhythm, normal heart sounds and intact distal pulses.   Pulmonary/Chest: Effort normal and breath sounds normal. No respiratory distress.  Abdominal: Soft. Bowel sounds are normal. There is no tenderness.  Musculoskeletal: Normal range of motion.  Neurological: She is alert and oriented to person, place, and time.  Cranial Nerves:  II: Pupils equal, round, reactive to light III,IV, VI: ptosis not present, extra-ocular motions intact bilaterally  V,VII: smile symmetric, facial light touch sensation equal VIII: hearing grossly normal bilaterally  IX,X: midline uvula rise  XI: bilateral shoulder shrug equal and strong XII: midline tongue extension Pt able to follow commands appropriately. EOM intact. Able to answer questions, but appears  slowed. Nods head to questions and is able to follow commands appropriately.   Skin: Skin is warm and dry.  Psychiatric: She has a normal mood and affect. Her speech is normal and behavior is normal. Thought content normal.  Nursing note and vitals reviewed.  ED Treatments / Results  Labs (all labs ordered are listed, but only abnormal results are displayed) Labs Reviewed  CBC - Abnormal; Notable for the following:       Result Value   RBC 3.47 (*)    Hemoglobin 10.7 (*)    HCT 35.0 (*)    MCV 100.9 (*)    RDW 17.3 (*)    All other components within normal limits  BASIC METABOLIC PANEL - Abnormal; Notable for the following:    Potassium 3.1 (*)    Glucose, Bld 121 (*)    All other components within normal limits  URINALYSIS, ROUTINE W REFLEX MICROSCOPIC - Abnormal; Notable for the following:    APPearance HAZY (*)    Hgb urine dipstick SMALL (*)    Protein, ur 30 (*)    Leukocytes, UA MODERATE (*)    Bacteria, UA RARE (*)    Squamous Epithelial / LPF 0-5 (*)    All other components within normal limits    EKG  EKG Interpretation None       Radiology Dg Chest 2 View  Result Date: 10/11/2016 CLINICAL DATA:  Several days of cough and weakness. History of MS, hypertension, kidney stones, spastic quadriparesis, previous episodes of community-acquired pneumonia and acute respiratory failure. EXAM: CHEST  2 VIEW COMPARISON:  Chest x-ray of September 29, 2016 FINDINGS: The lungs are mildly hypo inflated. There is subtle increased density at the right lung base posteriorly. The left lung is grossly clear. There is no pleural effusion. The cardiac silhouette is enlarged. The pulmonary vascularity is not engorged. The mediastinum is normal in width. The bony thorax exhibits no acute abnormality. IMPRESSION: Subtle increased density in the right lower lobe suggests atelectasis or early pneumonia. Followup PA and lateral chest X-ray is recommended in 3-4 weeks following trial of antibiotic  therapy to ensure resolution and exclude underlying malignancy. Electronically Signed   By: David  Martinique M.D.   On: 10/11/2016 10:18   Mr Jeri Cos And Wo Contrast  Result  Date: 10/11/2016 CLINICAL DATA:  Multiple sclerosis diagnosed more than 30 years ago. Acute onset of confusion and speech disturbance last night. EXAM: MRI HEAD WITHOUT AND WITH CONTRAST TECHNIQUE: Multiplanar, multiecho pulse sequences of the brain and surrounding structures were obtained without and with intravenous contrast. CONTRAST:  43m MULTIHANCE GADOBENATE DIMEGLUMINE 529 MG/ML IV SOLN COMPARISON:  08/17/2016 and multiple previous including 08/05/2016 and 01/24/2015. FINDINGS: Brain: Re- demonstrated are widespread confluent abnormal FLAIR and T2 signal lesions throughout the cerebral hemispheric white matter consistent with the clinical diagnosis of chronic multiple sclerosis. There is deep in subcortical involvement. There are no newly developed foci when compared to the previous studies. Again, several of the areas show restricted diffusion, most notable in the right occipital subcortical white matter and posterior limb internal capsule on the right. No evidence of mass lesion, hemorrhage or obstructive hydrocephalus. Chronic ventricular prominence relates to central atrophy. After contrast administration, no abnormal enhancement occurs. Incidental anterior middle cranial fossa arachnoid cysts, left larger than right as seen previously. Vascular: Major vessels at the base of the brain show flow. Skull and upper cervical spine: Negative Sinuses/Orbits: Clear/normal Other: None significant IMPRESSION: No change since January. Confluent white matter disease throughout the cerebral hemispheres consistent with chronic multiple sclerosis. Several foci show restricted diffusion, but no contrast enhancement. Whereas theoretically ischemic infarction could be hidden within this extensive demyelinating disease, there is no specific reason to  suggest that based on these images. Electronically Signed   By: MNelson ChimesM.D.   On: 10/11/2016 12:06    Procedures Procedures (including critical care time)  Medications Ordered in ED Medications - No data to display   Initial Impression / Assessment and Plan / ED Course  I have reviewed the triage vital signs and the nursing notes.  Pertinent labs & imaging results that were available during my care of the patient were reviewed by me and considered in my medical decision making (see chart for details).  Final Clinical Impressions(s) / ED Diagnoses  {I have reviewed and evaluated the relevant laboratory values. {I have reviewed and evaluated the relevant imaging studies. {I have interpreted the relevant EKG. {I have reviewed the relevant previous healthcare records.  {I obtained HPI from historian. {Patient discussed with supervising physician.  ED Course:  Assessment: Pt is a 51y.o. 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 Answers questions appropriately, but slowed. Follows commands appropriately. No pain currently. Noted hypoxia at home in low 80s, but 100s on RA in ED. Family notes hx same in December. Chart review shows likely psychogenic. MRIs negative at the time. Noted recent admission for HAP 2 weeks ago. Seen by PCP yesterday for follow up with fatigue. Told to DC Buspar that was Rxed by Neurologist during recent admission. On exam, pt in NAD. Nontoxic/nonseptic appearing. VSS. Afebrile. Lungs CTA. Heart RRR. Abdomen nontender soft. CN evaluated and unremarkable. CBC unremarkable. BMP unremarkable. UA unremarkable. CXR unremarkable. Discussed with supervising physician. Obtained MR Brain w/ w/o for further evaluation, which was unchanged since January. Possible psychogenic component as noted with similar episode in December with  previous admission. Possible Buspar involvement. Continue to DC medication. Plan is to DC home with close follow up to PCP. Return precautions given. Pt seen by supervising physician who agrees.. At time of discharge, Patient is in no acute distress. Vital Signs are stable. Patient able to tolerate PO.   Disposition/Plan:  DC Home Additional Verbal discharge instructions given and discussed with patient.  Pt Instructed to f/u with PCP in the next week for evaluation and treatment of symptoms. Return precautions given Pt acknowledges and agrees with plan  Supervising Physician Fatima Blank, MD  Final diagnoses:  Weakness  Difficulty with speech    New Prescriptions New Prescriptions   No medications on file       Shary Decamp, [name] 10/11/16 [place], MD 10/11/16 1736

## 2016-10-11 NOTE — ED Notes (Signed)
Pt to MRI

## 2016-10-11 NOTE — ED Notes (Signed)
Pt states that the valium isn't working.

## 2016-10-17 ENCOUNTER — Telehealth: Payer: Self-pay | Admitting: Neurology

## 2016-10-17 NOTE — Telephone Encounter (Signed)
I have spoken with Lurena Joiner this afternoon.  He will drop paperwork off tomorrow/fim

## 2016-10-17 NOTE — Telephone Encounter (Signed)
Ma Hillock with [employer] called office in reference to paperwork for a powered wheel chair for patient.  Requesting to come in to complete paperwork vs. Faxing back and forth.  Please call

## 2016-10-30 ENCOUNTER — Other Ambulatory Visit: Payer: Self-pay | Admitting: Adult Health

## 2016-11-06 NOTE — Telephone Encounter (Signed)
Patient called office to see if paperwork has been signed and completed.  Please call

## 2016-11-06 NOTE — Telephone Encounter (Signed)
I have spoken with Vanessa Short this afternoon.  She is inquiring about [employer] paperwork.  I have advised that I received paperwork and faxed it back on 10-18-16.  Copy of paperwork mailed to Vanessa Short at her home address per her request/fim

## 2016-12-18 ENCOUNTER — Other Ambulatory Visit: Payer: Self-pay | Admitting: Adult Health

## 2016-12-19 ENCOUNTER — Telehealth: Payer: Self-pay | Admitting: Neurology

## 2016-12-19 MED ORDER — DANTROLENE SODIUM 50 MG PO CAPS: 50.0000 mg | ORAL_CAPSULE | Freq: Three times a day (TID) | ORAL | 3 refills | Status: DC

## 2016-12-19 NOTE — Telephone Encounter (Signed)
Pt request refill for dantrolene (DANTRIUM) 50 MG capsule sent to [facility]. Pt is out of the medication as of today

## 2016-12-19 NOTE — Telephone Encounter (Signed)
Dantrolene escribed to [facility] as requested/fim

## 2017-01-15 ENCOUNTER — Other Ambulatory Visit: Payer: Self-pay | Admitting: Neurology

## 2017-01-26 ENCOUNTER — Other Ambulatory Visit: Payer: Self-pay | Admitting: Neurology

## 2017-02-27 ENCOUNTER — Ambulatory Visit (INDEPENDENT_AMBULATORY_CARE_PROVIDER_SITE_OTHER): Payer: Commercial Managed Care - PPO | Admitting: Neurology

## 2017-02-27 ENCOUNTER — Encounter: Payer: Self-pay | Admitting: Neurology

## 2017-02-27 VITALS — BP 145/81 | HR 73 | Resp 20 | Ht 63.0 in | Wt 175.0 lb

## 2017-02-27 DIAGNOSIS — G8250 Quadriplegia, unspecified: Secondary | ICD-10-CM | POA: Diagnosis not present

## 2017-02-27 DIAGNOSIS — F32A Depression, unspecified: Secondary | ICD-10-CM

## 2017-02-27 DIAGNOSIS — R5383 Other fatigue: Secondary | ICD-10-CM

## 2017-02-27 DIAGNOSIS — G35 Multiple sclerosis: Secondary | ICD-10-CM | POA: Diagnosis not present

## 2017-02-27 DIAGNOSIS — N399 Disorder of urinary system, unspecified: Secondary | ICD-10-CM | POA: Diagnosis not present

## 2017-02-27 DIAGNOSIS — F329 Major depressive disorder, single episode, unspecified: Secondary | ICD-10-CM | POA: Diagnosis not present

## 2017-02-27 DIAGNOSIS — Z993 Dependence on wheelchair: Secondary | ICD-10-CM

## 2017-02-27 DIAGNOSIS — Z79899 Other long term (current) drug therapy: Secondary | ICD-10-CM | POA: Diagnosis not present

## 2017-02-27 MED ORDER — AMITRIPTYLINE HCL 10 MG PO TABS: ORAL_TABLET | ORAL | 11 refills | Status: DC

## 2017-02-27 MED ORDER — DRONABINOL 2.5 MG PO CAPS: 2.5000 mg | ORAL_CAPSULE | Freq: Two times a day (BID) | ORAL | 5 refills | Status: DC

## 2017-02-27 MED ORDER — BACLOFEN 10 MG PO TABS: ORAL_TABLET | ORAL | 5 refills | Status: DC

## 2017-02-27 NOTE — Progress Notes (Signed)
[facility]  [name] DOB: 05-Jun-1966  REFERRING CLINICIAN: Adah Salvage, [facility] HISTORY FROM: patient  REASON FOR VISIT: MS   HISTORICAL  CHIEF COMPLAINT:  Chief Complaint  Patient presents with  . Multiple Sclerosis    Sts. she continues to tolerate Aubagio well.  Has not made decision on Ocrevus. Hallucinations resolved after decreasing Amitriptyline to 91m qhs. /[place]   HISTORY OF PRESENT ILLNESS:  Vanessa Short a 51year old woman diagnosed with MS in 1989.     MS:   She is on Aubagio,  she tolerates it well. She has noted some worsening while on the medication clinically. In the past, we have discussed ocrelizumab.  Gait/Strength/sensation:  Legs are doing the same with severe weakness and spasticity.  Dantrolene has not helped much for spasticity.   She takes baclofen 4.5 pills a day and we discussed increasing this and decreasing the dantrolene.   Spasms often occur when she lays down in bed.   She has dysesthetic nerve pain in her legs, helped by amitriptyline and lamotrigine.     In the past, we tried Botox injections into the left arm but  Botox did not improve functioning.   Writing has become more difficulty. She also has had more problems controlling her wheelchair and has had control on her current wheelchair.    Sensation/pain:  She has right anterior shoulder region dysesthesia but this is milder than last year.  Gabapentin was not tolerated.    Lamotrigine 150 mg po bid with amitriptyline helped with dysesthesia at first but less now.   We just went up on the lamotrigine.    Vision:   Vision is about the same.  She has mild decreased acuity and occasional diplopia.        Bladder/Bowel:  She has bladder dysfunction with some benefit form Vesicare.   She more incontinence and always uses Depends.  She is on Linzess due to many of her med's making constipation worse.  She also has ulcerative colitis and  requires frequent colonoscopies  Fatigue/sleep:   She has daily fatigue .   She has some insomnia at times.     She has nocturia at night and sleep is variable.    Hallucinations:  Prior to the hospitalization, she would get occasional mild visual hallucinations. These are occurring more. When she was at her worse, the last week of December in the first couple days in January, she had more hallucinations and also had severe confusion.    Mood/Cognition     she notes a mild depression. She has some increased stress with some family issues such as being the executor of her father's will and  having a teenage daughter. There is mild anxiety. Earlier this year, in association with a severe infection requiring hospitalization, she had cognitive issues. Back to baseline and currently she is doing well with mild reduced attention..  MS History:  She presented with a Lhermite's syndrome in 1988 or 1989.   MRIs of the head and neck were performed. They showed lesions consistent with the diagnosis of multiple sclerosis. Lumbar puncture was not necessary. Over the next 15 years, she would get some exacerbations and have courses of steroids. However, a disease modifying therapy was not prescribed. By 2004, she had difficulty with her gait and would often have to lean on somebody for support.  She started Copaxone that year and stayed on it for about one half years. She stopped due to  a lot of itching. She then switched to Rebif. While on Copaxone Rebif she continues to have exacerbations and further difficulties with her gait. About 2012 she switched to [facility] and has been on that medicine since. She tolerates Aubagio well but is not sure how well it is working for her.   She was off Aubagio x 2-3 weeks in 2013 and felt she did worse.  Last MRI's about 3 years ago.    REVIEW OF SYSTEMS:  Constitutional: No fevers, chills, sweats, or change in appetite.  Has fatigue and poor slep Eyes: see above.   No eye  pain Ear, nose and throat: No hearing loss, ear pain, nasal congestion, sore throat Cardiovascular: No chest pain, palpitations Respiratory:  No shortness of breath at rest or with exertion.   No wheezes GastrointestinaI: Has UC - doing well.   No nausea, vomiting, diarrhea, abdominal pain, fecal incontinence Genitourinary:  see above Musculoskeletal:  No neck pain, back pain Integumentary: No rash, pruritus, skin lesions Neurological: as above Psychiatric: as above. Endocrine: No palpitations, diaphoresis, change in appetite, change in weigh or increased thirst Hematologic/Lymphatic:  No anemia, purpura, petechiae. Allergic/Immunologic: No itchy/runny eyes, nasal congestion, recent allergic reactions, rashes  ALLERGIES: Allergies  Allergen Reactions  . Oxycodone-Acetaminophen Other (See Comments)    Other  . Penicillins Rash    Has patient had a PCN reaction causing immediate rash, facial/tongue/throat swelling, SOB or lightheadedness with hypotension: NO Has patient had a PCN reaction causing severe rash involving mucus membranes or skin necrosis: NO Has patient had a PCN reaction that required hospitalization NO Has patient had a PCN reaction occurring within the last 10 years:NO If all of the above answers are "NO", then may proceed with Cephalosporin use.    HOME MEDICATIONS: Outpatient Medications Prior to Visit  Medication Sig Dispense Refill  . acetaminophen (TYLENOL) 500 MG tablet Take 500 mg by mouth every 6 (six) hours as needed for mild pain or moderate pain.    Marland Kitchen acyclovir (ZOVIRAX) 400 MG tablet Take 400 mg by mouth daily. Take every day per patient  0  . atorvastatin (LIPITOR) 20 MG tablet Take 1 tablet (20 mg total) by mouth daily at 6 PM.    . b complex vitamins tablet Take 1 tablet by mouth daily.     . cholecalciferol (VITAMIN D) 1000 UNITS tablet Take 1,000 Units by mouth daily.     . dantrolene (DANTRIUM) 50 MG capsule Take 1 capsule (50 mg total) by mouth 3  (three) times daily. 270 capsule 3  . FLUoxetine (PROZAC) 40 MG capsule Take 1 capsule (40 mg total) by mouth daily. 90 capsule 3  . FOLIC ACID PO Take 267 mcg by mouth daily.     . furosemide (LASIX) 40 MG tablet Take 1 tablet (40 mg total) by mouth 2 (two) times daily. 30 tablet   . ibuprofen (ADVIL,MOTRIN) 200 MG tablet Take 200 mg by mouth every 6 (six) hours as needed for moderate pain.    Marland Kitchen labetalol (NORMODYNE) 100 MG tablet Take 100 mg by mouth daily.    Marland Kitchen lamoTRIgine (LAMICTAL) 200 MG tablet Take 1 tablet (200 mg total) by mouth 3 (three) times daily. 270 tablet 3  . mesalamine (PENTASA) 500 MG CR capsule Take 1,000 mg by mouth 3 (three) times daily.     . Multiple Vitamin (MULTIVITAMIN) tablet Take 1 tablet by mouth daily.    Marland Kitchen NUTRITIONAL SUPPLEMENT LIQD Take 120 mLs by mouth 2 (two) times daily. MedPass    .  OXcarbazepine (TRILEPTAL) 150 MG tablet TAKE 1 TABLET 3 TIMES DAILY 270 tablet 0  . sodium chloride (OCEAN) 0.65 % SOLN nasal spray Place 1 spray into both nostrils as needed for congestion.  0  . solifenacin (VESICARE) 10 MG tablet Take 10 mg by mouth daily.     . Teriflunomide (AUBAGIO) 14 MG TABS Take 1 tablet by mouth daily. 84 tablet 4  . zinc oxide 20 % ointment Apply 1 application topically 3 (three) times daily. Apply to bilateral buttocks every shift as a preventative measure    . amitriptyline (ELAVIL) 25 MG tablet Take 1 tablet (25 mg total) by mouth at bedtime. 90 tablet 3  . baclofen (LIORESAL) 10 MG tablet Take 10-15 mg by mouth 3 (three) times daily. 10 mg twice daily and 15 mg at bedtime    . polyethylene glycol (MIRALAX / GLYCOLAX) packet Take 17 g by mouth daily. (Patient taking differently: Take 17 g by mouth daily as needed for mild constipation. ) 14 each 0  . baclofen (LIORESAL) 10 MG tablet Take 1.5 tablets (15 mg total) by mouth at bedtime. 30 each 0  . busPIRone (BUSPAR) 15 MG tablet Take 1 tablet (15 mg total) by mouth 2 (two) times daily. (Patient not  taking: Reported on 02/27/2017) 180 tablet 3  . cefpodoxime (VANTIN) 200 MG tablet Take 1 tablet (200 mg total) by mouth 2 (two) times daily. 14 tablet 0   No facility-administered medications prior to visit.     PAST MEDICAL HISTORY: Past Medical History:  Diagnosis Date  . Headache   . Hypertension   . Kidney stones   . Movement disorder   . Multiple sclerosis ([place])   . Neuropathy   . Ulcerative colitis ([place])   . Vision abnormalities     PAST SURGICAL HISTORY: Past Surgical History:  Procedure Laterality Date  . KIDNEY STONE SURGERY      FAMILY HISTORY: Family History  Problem Relation Age of Onset  . Dementia Mother   . Hypertension Father   . Hyperlipidemia Father   . Diabetes Father   . Heart disease Father     SOCIAL HISTORY:  Social History   Social History  . Marital status: Married    Spouse name: N/A  . Number of children: N/A  . Years of education: N/A   Occupational History  . Not on file.   Social History Main Topics  . Smoking status: Never Smoker  . Smokeless tobacco: Never Used  . Alcohol use No  . Drug use: No  . Sexual activity: Not on file   Other Topics Concern  . Not on file   Social History Narrative  . No narrative on file     PHYSICAL EXAM  Vitals:   02/27/17 1530  BP: (!) 145/81  Pulse: 73  Resp: 20  Weight: 175 lb (79.4 kg)  Height: 5' 3"  (1.6 m)    Body mass index is 31 kg/m.   General: The patient is well-developed and well-nourished and in no acute distress  Skin: Extremities are without rash.    Her right shoulder is moderately tender over subacromial bursa.     Neurologic Exam  Mental status: The patient is alert and oriented x 3 at the time of the examination. The patient has apparent normal recent and remote memory, with an apparently normal attention span and concentration ability.   Speech is normal.  Cranial nerves: Extraocular movements are full.    Facial strength and sensation is  normal.  Trapezius strength is normal.  No dysarthria is noted.  The tongue is midline, and the patient has symmetric elevation of the soft palate. No obvious hearing deficits are noted.  Motor:  Muscle bulk is normal .  Tone is increased in the legs, right greater than left. Tone is also increased in the arms, a little more left than right.   Strength is  0/5 in legs, in arms strength is 2-/5 in left arm and 2+ to 3 /5 in right arm  with extension > flexion weakness.    Sensory: Sensory testing is intact to pinprick, soft touch in arms and legs.  She has decreased vibration sensation in the legs. s  Coordination: Cerebellar testing is poor bilateral due to weakness.  Gait and station: She can not stand or walk.    Reflexes: Deep tendon reflexes show spread at the knees though jerks are poor.      DIAGNOSTIC DATA (LABS, IMAGING, TESTING) - I reviewed patient records, labs, notes, testing and imaging myself where available.  Lab Results  Component Value Date   WBC 10.0 10/11/2016   HGB 10.7 (L) 10/11/2016   HCT 35.0 (L) 10/11/2016   MCV 100.9 (H) 10/11/2016   PLT 257 10/11/2016      Component Value Date/Time   NA 145 10/11/2016 0932   NA 142 08/24/2016   K 3.1 (L) 10/11/2016 0932   CL 102 10/11/2016 0932   CO2 31 10/11/2016 0932   GLUCOSE 121 (H) 10/11/2016 0932   BUN 8 10/11/2016 0932   BUN 17 08/24/2016   CREATININE 0.88 10/11/2016 0932   CALCIUM 9.6 10/11/2016 0932   PROT 6.2 (L) 09/29/2016 2129   PROT 6.4 11/09/2015 1351   ALBUMIN 3.4 (L) 09/29/2016 2129   ALBUMIN 4.0 11/09/2015 1351   AST 22 09/29/2016 2129   ALT 22 09/29/2016 2129   ALKPHOS 84 09/29/2016 2129   BILITOT 0.9 09/29/2016 2129   BILITOT 0.4 11/09/2015 1351   GFRNONAA >60 10/11/2016 0932   GFRAA >60 10/11/2016 0932       ASSESSMENT AND PLAN  Multiple sclerosis ([place]) - Plan: CBC with Differential/Platelet, COMPLETE METABOLIC PANEL WITH GFR  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   For her spasticity, she will continue baclofen and I will increase the dose to 60 mg. She will stop dantrolene. We also discussed adding Marinol at a low dose of 2.5 mg twice a day to help with her spasticity.    She understands MS spasticity is an off label use and she may have to pay cash for the medication at a pharmacy.  2.   For now, she wishes to stay on Aubagio. We have discussed switching to ocrelizumab and she will consider this in the future.  3.   I will try to further reduce the amitriptyline first to 20 mg and then to 10 mg at night. She could consider stopping it  4.   She will return for follow-up in 4 months. Call sooner if  new or worsening neurologic symptoms she should call us sooner. buspar anxiety   This was a 45 minute face-to-face evaluation with greater than one half of time counseling or coordinating care about her MS and related symptoms.  Richard A. Felecia Shelling, MD, PhD 0/96/0454, 0:98 PM Certified in Neurology, Clinical Neurophysiology, Sleep Medicine, Pain Medicine and Neuroimaging  [facility] [phone]  [address] [place], [place] [address] [phone]

## 2017-02-28 LAB — CBC WITH DIFFERENTIAL/PLATELET
BASOS: 1 %
Basophils Absolute: 0.1 10*3/uL (ref 0.0–0.2)
EOS (ABSOLUTE): 0.4 10*3/uL (ref 0.0–0.4)
Eos: 5 %
Hematocrit: 32.9 % — ABNORMAL LOW (ref 34.0–46.6)
Hemoglobin: 10.1 g/dL — ABNORMAL LOW (ref 11.1–15.9)
IMMATURE GRANS (ABS): 0 10*3/uL (ref 0.0–0.1)
IMMATURE GRANULOCYTES: 0 %
LYMPHS: 13 %
Lymphocytes Absolute: 1 10*3/uL (ref 0.7–3.1)
MCH: 29.4 pg (ref 26.6–33.0)
MCHC: 30.7 g/dL — ABNORMAL LOW (ref 31.5–35.7)
MCV: 96 fL (ref 79–97)
Monocytes Absolute: 1.1 10*3/uL — ABNORMAL HIGH (ref 0.1–0.9)
Monocytes: 15 %
NEUTROS PCT: 66 %
Neutrophils Absolute: 5 10*3/uL (ref 1.4–7.0)
PLATELETS: 236 10*3/uL (ref 150–379)
RBC: 3.43 x10E6/uL — AB (ref 3.77–5.28)
RDW: 14.5 % (ref 12.3–15.4)
WBC: 7.5 10*3/uL (ref 3.4–10.8)

## 2017-02-28 LAB — COMPREHENSIVE METABOLIC PANEL
A/G RATIO: 2 (ref 1.2–2.2)
ALT: 16 IU/L (ref 0–32)
AST: 14 IU/L (ref 0–40)
Albumin: 4.3 g/dL (ref 3.5–5.5)
Alkaline Phosphatase: 120 IU/L — ABNORMAL HIGH (ref 39–117)
BILIRUBIN TOTAL: 0.3 mg/dL (ref 0.0–1.2)
BUN/Creatinine Ratio: 14 (ref 9–23)
BUN: 13 mg/dL (ref 6–24)
CALCIUM: 8.6 mg/dL — AB (ref 8.7–10.2)
CHLORIDE: 103 mmol/L (ref 96–106)
CO2: 24 mmol/L (ref 20–29)
Creatinine, Ser: 0.96 mg/dL (ref 0.57–1.00)
GFR calc Af Amer: 79 mL/min/{1.73_m2} (ref >59)
GFR, EST NON AFRICAN AMERICAN: 68 mL/min/{1.73_m2} (ref >59)
GLUCOSE: 108 mg/dL — AB (ref 65–99)
Globulin, Total: 2.2 g/dL (ref 1.5–4.5)
POTASSIUM: 3.3 mmol/L — AB (ref 3.5–5.2)
Sodium: 145 mmol/L — ABNORMAL HIGH (ref 134–144)
Total Protein: 6.5 g/dL (ref 6.0–8.5)

## 2017-03-01 ENCOUNTER — Telehealth: Payer: Self-pay | Admitting: *Deleted

## 2017-03-01 NOTE — Telephone Encounter (Signed)
-----   Message from Britt Bottom, MD sent at 03/01/2017  9:02 AM EDT ----- Please let the patient know that the lab work is ok.   She has mild anemia and could take OTC iron and folic acid .

## 2017-03-01 NOTE — Telephone Encounter (Signed)
I have spoken with Vanessa Short this morning and per RAS, reviewed lab results and rec. otc Fe and Folic acid supplements.  She verbalized understanding of same/fim

## 2017-03-22 ENCOUNTER — Telehealth: Payer: Self-pay | Admitting: Neurology

## 2017-03-22 NOTE — Telephone Encounter (Signed)
Message sent to Dr.Sater per his request.

## 2017-03-22 NOTE — Telephone Encounter (Signed)
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 It should state that she is competitive to run the trust without any issues. The pt said her siblings are trying to take over the estate. They have sent affidavit to the bank.  [place]  [address] [place] [address] [phone]

## 2017-03-23 ENCOUNTER — Encounter: Payer: Self-pay | Admitting: Neurology

## 2017-03-23 NOTE — Telephone Encounter (Signed)
Short letter is printed out and signed and can be sent or picked up

## 2017-03-26 NOTE — Telephone Encounter (Signed)
LMOM (identied vm) that letter has been faxed to [facility] and [place] as requested.  She does not need to return this call unless she has questions/fim

## 2017-04-05 ENCOUNTER — Other Ambulatory Visit: Payer: Self-pay | Admitting: Neurology

## 2017-04-14 ENCOUNTER — Other Ambulatory Visit: Payer: Self-pay | Admitting: Neurology

## 2017-04-26 ENCOUNTER — Other Ambulatory Visit: Payer: Self-pay | Admitting: Family Medicine

## 2017-04-26 DIAGNOSIS — Z1231 Encounter for screening mammogram for malignant neoplasm of breast: Secondary | ICD-10-CM

## 2017-05-09 ENCOUNTER — Other Ambulatory Visit: Payer: Self-pay | Admitting: Neurology

## 2017-05-24 ENCOUNTER — Telehealth: Payer: Self-pay

## 2017-05-24 MED ORDER — AMITRIPTYLINE HCL 10 MG PO TABS: ORAL_TABLET | ORAL | 3 refills | Status: DC

## 2017-05-24 NOTE — Telephone Encounter (Signed)
Received refill request for a 90 day supply on patients amitriptyline. New Rx sent to [facility]

## 2017-06-01 ENCOUNTER — Ambulatory Visit
Admission: RE | Admit: 2017-06-01 | Discharge: 2017-06-01 | Disposition: A | Discharge status: Home / Self Care | Payer: Commercial Managed Care - PPO | Source: Ambulatory Visit | Attending: Family Medicine | Admitting: Family Medicine

## 2017-06-01 DIAGNOSIS — Z1231 Encounter for screening mammogram for malignant neoplasm of breast: Secondary | ICD-10-CM

## 2017-07-03 ENCOUNTER — Other Ambulatory Visit: Payer: Self-pay

## 2017-07-03 ENCOUNTER — Encounter (INDEPENDENT_AMBULATORY_CARE_PROVIDER_SITE_OTHER): Payer: Self-pay

## 2017-07-03 ENCOUNTER — Ambulatory Visit (INDEPENDENT_AMBULATORY_CARE_PROVIDER_SITE_OTHER): Payer: Commercial Managed Care - PPO | Admitting: Neurology

## 2017-07-03 ENCOUNTER — Encounter: Payer: Self-pay | Admitting: Neurology

## 2017-07-03 VITALS — BP 137/87 | HR 72

## 2017-07-03 DIAGNOSIS — G35 Multiple sclerosis: Secondary | ICD-10-CM

## 2017-07-03 DIAGNOSIS — G8250 Quadriplegia, unspecified: Secondary | ICD-10-CM

## 2017-07-03 DIAGNOSIS — R5383 Other fatigue: Secondary | ICD-10-CM

## 2017-07-03 DIAGNOSIS — F329 Major depressive disorder, single episode, unspecified: Secondary | ICD-10-CM

## 2017-07-03 DIAGNOSIS — R208 Other disturbances of skin sensation: Secondary | ICD-10-CM | POA: Diagnosis not present

## 2017-07-03 DIAGNOSIS — Z79899 Other long term (current) drug therapy: Secondary | ICD-10-CM | POA: Diagnosis not present

## 2017-07-03 DIAGNOSIS — F32A Depression, unspecified: Secondary | ICD-10-CM

## 2017-07-03 DIAGNOSIS — R441 Visual hallucinations: Secondary | ICD-10-CM

## 2017-07-03 MED ORDER — DRONABINOL 2.5 MG PO CAPS: 2.5000 mg | ORAL_CAPSULE | Freq: Two times a day (BID) | ORAL | 5 refills | Status: DC

## 2017-07-03 NOTE — Progress Notes (Signed)
[facility]  [name] DOB: February 26, 1966  REFERRING CLINICIAN: Adah Salvage, [facility] HISTORY FROM: patient  REASON FOR VISIT: MS   HISTORICAL  CHIEF COMPLAINT:  Chief Complaint  Patient presents with  . Multiple Sclerosis    She c/o progression of sx. despite compliance with Aubagio. Sts. weakness, fatigue, spasms in legs, arms, hands are worse/fim    HISTORY OF PRESENT ILLNESS:  Vanessa Short is a 51 year old woman diagnosed with MS in 1989.     Update 07/03/2017:    Her MS is doing about the same.   She does not note any definite new symptoms or exacerbation but she does feel that her arm strength, much worse with her pneumonia and has not returned to baseline. Spasticity is still a problem in the legs.     For spasticity,she is on baclofen 10 mg po 5-6 pills a day.      She was taking Marinol 2.5 mg bid but felt a little out of it and backed off to just once a day which is more beneficial for her.   Bladder is doing ok on Vesicare.   When she looks to the far left she notes diplopia and she shuts her left eye to avoid.    She notes some fatigue but this is about the same as at the last visit. She is sleeping well most nights but will wake up more if her husbands snoring is loud.    She has depression. Vanessa feels this is stable patient does well. She is not having as many hallucinations.  From 02/27/2017: MS:   She is on Aubagio,  she tolerates it well. She has noted some worsening while on the medication clinically. In the past, we have discussed ocrelizumab.  Gait/Strength/sensation:  Legs are doing the same with severe weakness and spasticity.  Dantrolene has not helped much for spasticity.   She takes baclofen 4.5 pills a day and we discussed increasing this and decreasing the dantrolene.   Spasms often occur when she lays down in bed.   She has dysesthetic nerve pain in her legs, helped by amitriptyline and lamotrigine.     In the  past, we tried Botox injections into the left arm but  Botox did not improve functioning.   Writing has become more difficulty. She also has had more problems controlling her wheelchair and has had control on her current wheelchair.    Sensation/pain:  She has right anterior shoulder region dysesthesia but this is milder than last year.  Gabapentin was not tolerated.    Lamotrigine 150 mg po bid with amitriptyline helped with dysesthesia at first but less now.   We just went up on the lamotrigine.    Vision:   Vision is about the same.  She has mild decreased acuity and occasional diplopia.        Bladder/Bowel:  She has bladder dysfunction with some benefit form Vesicare.   She more incontinence and always uses Depends.  She is on Linzess due to many of her med's making constipation worse.  She also has ulcerative colitis and requires frequent colonoscopies  Fatigue/sleep:   She has daily fatigue .   She has some insomnia at times.     She has nocturia at night and sleep is variable.    Hallucinations:  Prior to the hospitalization, she would get occasional mild visual hallucinations. These are occurring more. When she was at her worse, the last week of December in  the first couple days in January, she had more hallucinations and also had severe confusion.    Mood/Cognition     she notes a mild depression. She has some increased stress with some family issues such as being the executor of her father's will and  having a teenage daughter. There is mild anxiety. Earlier this year, in association with a severe infection requiring hospitalization, she had cognitive issues. Back to baseline and currently she is doing well with mild reduced attention..  MS History:  She presented with a Lhermite's syndrome in 1988 or 1989.   MRIs of the head and neck were performed. They showed lesions consistent with the diagnosis of multiple sclerosis. Lumbar puncture was not necessary. Over the next 15 years, she would  get some exacerbations and have courses of steroids. However, a disease modifying therapy was not prescribed. By 2004, she had difficulty with her gait and would often have to lean on somebody for support.  She started Copaxone that year and stayed on it for about one half years. She stopped due to a lot of itching. She then switched to Rebif. While on Copaxone Rebif she continues to have exacerbations and further difficulties with her gait. About 2012 she switched to [facility] and has been on that medicine since. She tolerates Aubagio well but is not sure how well it is working for her.   She was off Aubagio x 2-3 weeks in 2013 and felt she did worse.  Last MRI's about 3 years ago.    REVIEW OF SYSTEMS:  Constitutional: No fevers, chills, sweats, or change in appetite.  Has fatigue and poor slep Eyes: see above.   No eye pain Ear, nose and throat: No hearing loss, ear pain, nasal congestion, sore throat Cardiovascular: No chest pain, palpitations Respiratory:  No shortness of breath at rest or with exertion.   No wheezes GastrointestinaI: Has UC - doing well.   No nausea, vomiting, diarrhea, abdominal pain, fecal incontinence Genitourinary:  see above Musculoskeletal:  No neck pain, back pain Integumentary: No rash, pruritus, skin lesions Neurological: as above Psychiatric: as above. Endocrine: No palpitations, diaphoresis, change in appetite, change in weigh or increased thirst Hematologic/Lymphatic:  No anemia, purpura, petechiae. Allergic/Immunologic: No itchy/runny eyes, nasal congestion, recent allergic reactions, rashes  ALLERGIES: Allergies  Allergen Reactions  . Oxycodone-Acetaminophen Other (See Comments)    Other  . Penicillins Rash    Has patient had a PCN reaction causing immediate rash, facial/tongue/throat swelling, SOB or lightheadedness with hypotension: NO Has patient had a PCN reaction causing severe rash involving mucus membranes or skin necrosis: NO Has patient had a PCN  reaction that required hospitalization NO Has patient had a PCN reaction occurring within the last 10 years:NO If all of the above answers are "NO", then may proceed with Cephalosporin use.    HOME MEDICATIONS: Outpatient Medications Prior to Visit  Medication Sig Dispense Refill  . acetaminophen (TYLENOL) 500 MG tablet Take 500 mg by mouth every 6 (six) hours as needed for mild pain or moderate pain.    Marland Kitchen acyclovir (ZOVIRAX) 400 MG tablet Take 400 mg by mouth daily. Take every day per patient  0  . amitriptyline (ELAVIL) 10 MG tablet One or two at bedtime 180 tablet 3  . AUBAGIO 14 MG TABS TAKE ONE TABLET (14 MG) BY MOUTH ONCE DAILY. MAY BE TAKEN WITH OR WITHOUT FOOD. STORE AT ROOM TEMPERATURE. 84 tablet 3  . b complex vitamins tablet Take 1 tablet by mouth daily.     Marland Kitchen  baclofen (LIORESAL) 10 MG tablet 10 mg up to 6 a day 180 each 5  . cholecalciferol (VITAMIN D) 1000 UNITS tablet Take 1,000 Units by mouth daily.     . dantrolene (DANTRIUM) 50 MG capsule Take 1 capsule (50 mg total) by mouth 3 (three) times daily. 270 capsule 3  . FLUoxetine (PROZAC) 40 MG capsule Take 1 capsule (40 mg total) by mouth daily. 90 capsule 3  . FOLIC ACID PO Take 056 mcg by mouth daily.     . furosemide (LASIX) 40 MG tablet Take 1 tablet (40 mg total) by mouth 2 (two) times daily. 30 tablet   . ibuprofen (ADVIL,MOTRIN) 200 MG tablet Take 200 mg by mouth every 6 (six) hours as needed for moderate pain.    Marland Kitchen labetalol (NORMODYNE) 100 MG tablet Take 100 mg by mouth daily.    Marland Kitchen lamoTRIgine (LAMICTAL) 200 MG tablet TAKE 1 TABLET (200 MG TOTAL) BY MOUTH 3 (THREE) TIMES DAILY. 270 tablet 3  . mesalamine (PENTASA) 500 MG CR capsule Take 1,000 mg by mouth 3 (three) times daily.     . Multiple Vitamin (MULTIVITAMIN) tablet Take 1 tablet by mouth daily.    Marland Kitchen NUTRITIONAL SUPPLEMENT LIQD Take 120 mLs by mouth 2 (two) times daily. MedPass    . OXcarbazepine (TRILEPTAL) 150 MG tablet TAKE 1 TABLET 3 TIMES DAILY 270 tablet 0    . sodium chloride (OCEAN) 0.65 % SOLN nasal spray Place 1 spray into both nostrils as needed for congestion.  0  . solifenacin (VESICARE) 10 MG tablet Take 10 mg by mouth daily.     Marland Kitchen zinc oxide 20 % ointment Apply 1 application topically 3 (three) times daily. Apply to bilateral buttocks every shift as a preventative measure    . dronabinol (MARINOL) 2.5 MG capsule Take 1 capsule (2.5 mg total) by mouth 2 (two) times daily before a meal. 60 capsule 5  . atorvastatin (LIPITOR) 20 MG tablet Take 1 tablet (20 mg total) by mouth daily at 6 PM. (Patient not taking: Reported on 07/03/2017)     No facility-administered medications prior to visit.     PAST MEDICAL HISTORY: Past Medical History:  Diagnosis Date  . Headache   . Hypertension   . Kidney stones   . Movement disorder   . Multiple sclerosis ([place])   . Neuropathy   . Ulcerative colitis ([place])   . Vision abnormalities     PAST SURGICAL HISTORY: Past Surgical History:  Procedure Laterality Date  . KIDNEY STONE SURGERY      FAMILY HISTORY: Family History  Problem Relation Age of Onset  . Dementia Mother   . Hypertension Father   . Hyperlipidemia Father   . Diabetes Father   . Heart disease Father   . Breast cancer Neg Hx     SOCIAL HISTORY:  Social History   Socioeconomic History  . Marital status: Married    Spouse name: Not on file  . Number of children: Not on file  . Years of education: Not on file  . Highest education level: Not on file  Social Needs  . Financial resource strain: Not on file  . Food insecurity - worry: Not on file  . Food insecurity - inability: Not on file  . Transportation needs - medical: Not on file  . Transportation needs - non-medical: Not on file  Occupational History  . Not on file  Tobacco Use  . Smoking status: Never Smoker  . Smokeless tobacco: Never Used  Substance and Sexual Activity  . Alcohol use: No  . Drug use: No  . Sexual activity: Not on file  Other Topics  Concern  . Not on file  Social History Narrative  . Not on file     PHYSICAL EXAM  Vitals:   07/03/17 1529  BP: 137/87  Pulse: 72    There is no height or weight on file to calculate BMI.   General: The patient is well-developed and well-nourished and in no acute distress  Skin: Extremities are without rash.    Her right shoulder is moderately tender over subacromial bursa.     Neurologic Exam  Mental status: The patient is alert and oriented x 3 at the time of the examination. The patient has apparent normal recent and remote memory, with an apparently normal attention span and concentration ability.   Speech is normal.  Cranial nerves: Extraocular movements are full.   Facial strength and sensation is normal. Trapezius strength is strong. There is no dysarthria.  No obvious hearing deficits are noted.  Motor:  Muscle bulk is normal . Tone is increased in the legs, right greater than left. Tone is also increase in the arms, slightly more on the right. Strength is/5 in the legs. Strength is 2 minus/5 in the left arm and 2+/5-3/5 in the right arm. Tone is increased in the legs, right greater than left. Tone is also increased in the arms, a little more left than right.   Strength is  0/5 in legs, in arms strength is 2 /5 in left arm and 2+ to 3 /5 in right arm  with extension > flexion weakness.    Sensory: Sensory testing is intact to pinprick, soft touch in arms and legs.  Vibration sensation is a little decrease in the legs.  Coordination: Cerebellar testing is difficult to test due to weakness.  Gait and station: She can not stand or walk.    Reflexes: Deep tendon reflexes show spread at the knees.  No ankle clonus.      DIAGNOSTIC DATA (LABS, IMAGING, TESTING) - I reviewed patient records, labs, notes, testing and imaging myself where available.  Lab Results  Component Value Date   WBC 7.5 02/27/2017   HGB 10.1 (L) 02/27/2017   HCT 32.9 (L) 02/27/2017   MCV 96  02/27/2017   PLT 236 02/27/2017      Component Value Date/Time   NA 145 (H) 02/27/2017 1618   K 3.3 (L) 02/27/2017 1618   CL 103 02/27/2017 1618   CO2 24 02/27/2017 1618   GLUCOSE 108 (H) 02/27/2017 1618   GLUCOSE 121 (H) 10/11/2016 0932   BUN 13 02/27/2017 1618   CREATININE 0.96 02/27/2017 1618   CALCIUM 8.6 (L) 02/27/2017 1618   PROT 6.5 02/27/2017 1618   ALBUMIN 4.3 02/27/2017 1618   AST 14 02/27/2017 1618   ALT 16 02/27/2017 1618   ALKPHOS 120 (H) 02/27/2017 1618   BILITOT 0.3 02/27/2017 1618   GFRNONAA 68 02/27/2017 1618   GFRAA 79 02/27/2017 1618       ASSESSMENT AND PLAN  Multiple sclerosis ([place]) - Plan: Stratify JCV Antibody Test ([facility]), CBC with Differential/Platelet, Comprehensive metabolic panel  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   Continue baclofen 50 of 60 mg daily and there are Marinol 2.5 mg once  or twice a day.  2.   She would like to consider different medication than Aubagio. Due to her pneumonia this year, I would be reluctant to place her on ocrelizumab. We will see if she is JCV antibody negative. If so she could be a candidate for Tysabri. Otherwise, we might want to wait till cladribine is available. QT interval was prolonged on a couple EKG so she is probably not a good Gilenya candidate.   3  She will return for follow-up in 4 months. Call sooner if  new or worsening neurologic symptoms she should call us sooner. buspar anxiety   42 minutes face-to-face evaluation with greater than one half of the time counseling or coordinating care about her MS and related symptoms.  Jaiveer Panas A. Felecia Shelling, MD, PhD 12/22/209, 1:73 PM Certified in Neurology, Clinical Neurophysiology, Sleep Medicine, Pain Medicine and Neuroimaging  [facility]  [facility] [address] [place], [place] [address] [phone]

## 2017-07-04 LAB — CBC WITH DIFFERENTIAL/PLATELET
BASOS: 1 %
Basophils Absolute: 0 10*3/uL (ref 0.0–0.2)
EOS (ABSOLUTE): 0.3 10*3/uL (ref 0.0–0.4)
EOS: 4 %
HEMATOCRIT: 36.6 % (ref 34.0–46.6)
HEMOGLOBIN: 11.5 g/dL (ref 11.1–15.9)
IMMATURE GRANS (ABS): 0 10*3/uL (ref 0.0–0.1)
IMMATURE GRANULOCYTES: 0 %
LYMPHS: 11 %
Lymphocytes Absolute: 0.8 10*3/uL (ref 0.7–3.1)
MCH: 30.7 pg (ref 26.6–33.0)
MCHC: 31.4 g/dL — ABNORMAL LOW (ref 31.5–35.7)
MCV: 98 fL — AB (ref 79–97)
Monocytes Absolute: 1 10*3/uL — ABNORMAL HIGH (ref 0.1–0.9)
Monocytes: 14 %
NEUTROS PCT: 70 %
Neutrophils Absolute: 5.1 10*3/uL (ref 1.4–7.0)
Platelets: 245 10*3/uL (ref 150–379)
RBC: 3.75 x10E6/uL — ABNORMAL LOW (ref 3.77–5.28)
RDW: 15.5 % — AB (ref 12.3–15.4)
WBC: 7.2 10*3/uL (ref 3.4–10.8)

## 2017-07-04 LAB — COMPREHENSIVE METABOLIC PANEL
A/G RATIO: 1.8 (ref 1.2–2.2)
ALBUMIN: 4.5 g/dL (ref 3.5–5.5)
ALT: 12 IU/L (ref 0–32)
AST: 17 IU/L (ref 0–40)
Alkaline Phosphatase: 94 IU/L (ref 39–117)
BUN / CREAT RATIO: 16 (ref 9–23)
BUN: 15 mg/dL (ref 6–24)
Bilirubin Total: 0.3 mg/dL (ref 0.0–1.2)
CALCIUM: 9.5 mg/dL (ref 8.7–10.2)
CO2: 25 mmol/L (ref 20–29)
CREATININE: 0.96 mg/dL (ref 0.57–1.00)
Chloride: 100 mmol/L (ref 96–106)
GFR, EST AFRICAN AMERICAN: 79 mL/min/{1.73_m2} (ref >59)
GFR, EST NON AFRICAN AMERICAN: 68 mL/min/{1.73_m2} (ref >59)
GLOBULIN, TOTAL: 2.5 g/dL (ref 1.5–4.5)
Glucose: 89 mg/dL (ref 65–99)
POTASSIUM: 3.3 mmol/L — AB (ref 3.5–5.2)
SODIUM: 143 mmol/L (ref 134–144)
TOTAL PROTEIN: 7 g/dL (ref 6.0–8.5)

## 2017-07-11 ENCOUNTER — Telehealth: Payer: Self-pay | Admitting: *Deleted

## 2017-07-11 NOTE — Telephone Encounter (Signed)
Spoke with Vanessa Short this afternoon.  She is switching from [place] to [place].  Her JCV ab is indeterminate at 0.35.  Assay is Positive.  Per RAS, ok to proceed with starting Tysabri, and he will monitor JCV ab.  If at some point the index value increases, he may need to switch her to a different dmt.  Vanessa Short verbalized understanding of same, would like to discuss with her husband, then will call me back with a decision./fim

## 2017-07-12 ENCOUNTER — Telehealth: Payer: Self-pay | Admitting: *Deleted

## 2017-07-12 MED ORDER — TRAMADOL HCL 50 MG PO TABS: 50.0000 mg | ORAL_TABLET | Freq: Two times a day (BID) | ORAL | 0 refills | Status: DC | PRN

## 2017-07-12 NOTE — Telephone Encounter (Signed)
Rx. [facility]ed to [facility] per faxed request from them/fim

## 2017-07-16 ENCOUNTER — Telehealth: Payer: Self-pay

## 2017-07-16 ENCOUNTER — Other Ambulatory Visit: Payer: Self-pay | Admitting: Neurology

## 2017-07-16 NOTE — Telephone Encounter (Signed)
Tramadol required a PA. It has been APPROVED  for the following time period: 06/16/2017 - 07/16/2018

## 2017-07-17 NOTE — Telephone Encounter (Signed)
PA# Gustavus Bryant of [place] [id number] SS/fim

## 2017-07-25 ENCOUNTER — Telehealth: Payer: Self-pay | Admitting: Neurology

## 2017-07-25 NOTE — Telephone Encounter (Signed)
Spoke with Vanessa Short.  She sts. she has decided she would like to start Tysabri, as discussed with RAS at last ov.  She is aware JCV ab is positive, that this will be monitored and after a certain amt. of time on Tysabri, or if index continues to rise, RAS will want to switch her to a different dmt with lower risk for PML.  Ty srf completed and given to [facility] in the infusion suite/fim

## 2017-07-25 NOTE — Telephone Encounter (Signed)
Patient calling to discuss taking Tysabri. [facility] uses [facility] [place].

## 2017-09-05 ENCOUNTER — Telehealth: Payer: Self-pay | Admitting: Neurology

## 2017-09-05 NOTE — Telephone Encounter (Signed)
Pt is needing a Tysabri injection and wanting a call back to discuss

## 2017-09-05 NOTE — Telephone Encounter (Signed)
Spoke  with Jerriah.  Ty srf was turned in to the infusion suite 07/25/17.  I will pass this message along to Infusion suite staff so they can call her back.  Norely also sts. she is considering having a PAC placed, b/c of poor vascular access, and wanted to know if blood for lab work can be drawn from [facility] as well, and I have advised it can be.  Just first 5-10 cc has to be wasted, than labs drawn, so Heparin doesn't alter results/fim

## 2017-09-13 ENCOUNTER — Other Ambulatory Visit: Payer: Self-pay | Admitting: Neurology

## 2017-09-14 ENCOUNTER — Other Ambulatory Visit: Payer: Self-pay | Admitting: Neurology

## 2017-09-24 NOTE — Telephone Encounter (Signed)
Spoke with [employer] and explained that [facility] completes the PA's for our patients infused here in the office.  The [facility] admin staff that do the PA's are not located in this office, and I don't have access to their emr to check status of a PA.  She may reach them at [phone]/fim

## 2017-09-24 NOTE — Telephone Encounter (Signed)
Cassandra with [employer] is requesting a call back at [phone] to discuss the PA for Tysabri

## 2017-09-26 ENCOUNTER — Telehealth: Payer: Self-pay | Admitting: Neurology

## 2017-09-26 DIAGNOSIS — G35 Multiple sclerosis: Secondary | ICD-10-CM

## 2017-09-26 DIAGNOSIS — I878 Other specified disorders of veins: Secondary | ICD-10-CM

## 2017-09-26 NOTE — Telephone Encounter (Signed)
Spoke with Tiffany.  She req. referral to surgery for consult for [facility] placement. She will be receiving long term IV meds for MS, and peripheral venous access is poor. No preference as to whom she sees.  Urgent referral placed, but pt. is aware that [facility] will require consult, then procedure will be scheduled, and this will not likely be done prior to first infusion, although will try./fim

## 2017-09-26 NOTE — Telephone Encounter (Signed)
Patient is calling requesting an order be put in for a porte. She needs this before infusion on 10-02-17 because she has trouble with her veins. Please call and discuss.

## 2017-09-26 NOTE — Addendum Note (Signed)
Addended by: France Ravens I on: 09/26/2017 10:17 AM   Modules accepted: Orders

## 2017-10-02 ENCOUNTER — Encounter: Payer: Self-pay | Admitting: *Deleted

## 2017-10-10 ENCOUNTER — Ambulatory Visit: Payer: Self-pay | Admitting: General Surgery

## 2017-10-10 HISTORY — PX: ENDOMETRIAL ABLATION: SHX621

## 2017-10-10 NOTE — Telephone Encounter (Signed)
Error

## 2017-10-11 ENCOUNTER — Telehealth: Payer: Self-pay | Admitting: *Deleted

## 2017-10-11 MED ORDER — OXCARBAZEPINE 150 MG PO TABS: 150.0000 mg | ORAL_TABLET | Freq: Three times a day (TID) | ORAL | 0 refills | Status: DC

## 2017-10-11 NOTE — Telephone Encounter (Signed)
[facility]ep[facility] escrib[facility] per faxed request from them/fim

## 2017-10-12 ENCOUNTER — Other Ambulatory Visit: Payer: Self-pay

## 2017-10-12 ENCOUNTER — Encounter (HOSPITAL_COMMUNITY): Payer: Self-pay | Admitting: *Deleted

## 2017-10-12 NOTE — Progress Notes (Addendum)
NO SOLID FOOD AFTER MIDNIGHT THE NIGHT PRIOR TO SURGERY. NOTHING BY MOUTH EXCEPT CLEAR LIQUIDS UNTIL 3 HOURS PRIOR TO [place] SURGERY. PLEASE FINISH ENSURE DRINK PER SURGEON ORDER 3 HOURS PRIOR TO SCHEDULED SURGERY TIME WHICH NEEDS TO BE COMPLETED AT 0930 am.   [facility] - Preparing for Surgery Before surgery, you can play an important role.  Because skin is not sterile, your skin needs to be as free of germs as possible.  You can reduce the number of germs on your skin by washing with CHG (chlorahexidine gluconate) soap before surgery.  CHG is an antiseptic cleaner which kills germs and bonds with the skin to continue killing germs even after washing. Please DO NOT use if you have an allergy to CHG or antibacterial soaps.  If your skin becomes reddened/irritated stop using the CHG and inform your nurse when you arrive at Short Stay. Do not shave (including legs and underarms) for at least 48 hours prior to the first CHG shower.  You may shave your face/neck. Please follow these instructions carefully:  1.  Shower with CHG Soap the night before surgery and the  morning of Surgery.  2.  If you choose to wash your hair, wash your hair first as usual with your  normal  shampoo.  3.  After you shampoo, rinse your hair and body thoroughly to remove the  shampoo.                           4.  Use CHG as you would any other liquid soap.  You can apply chg directly  to the skin and wash                       Gently with a scrungie or clean washcloth.  5.  Apply the CHG Soap to your body ONLY FROM THE NECK DOWN.   Do not use on face/ open                           Wound or open sores. Avoid contact with eyes, ears mouth and genitals (private parts).                       Wash face,  Genitals (private parts) with your normal soap.             6.  Wash thoroughly, paying special attention to the area where your surgery  will be performed.  7.  Thoroughly rinse your body with warm water from the neck  down.  8.  DO NOT shower/wash with your normal soap after using and rinsing off  the CHG Soap.                9.  Pat yourself dry with a clean towel.            10.  Wear clean pajamas.            11.  Place clean sheets on your bed the night of your first shower and do not  sleep with pets. Day of Surgery : Do not apply any lotions/deodorants the morning of surgery.  Please wear clean clothes to the hospital/surgery center.  Patient picked up information on 10/15/2017 at 220 pm  And verbalized understanding of carbohydrate drink and hibiclens wash.

## 2017-10-15 ENCOUNTER — Inpatient Hospital Stay (HOSPITAL_COMMUNITY): Admission: RE | Admit: 2017-10-15 | Payer: Self-pay | Source: Ambulatory Visit

## 2017-10-16 ENCOUNTER — Ambulatory Visit (HOSPITAL_COMMUNITY): Payer: Commercial Managed Care - PPO | Admitting: Anesthesiology

## 2017-10-16 ENCOUNTER — Other Ambulatory Visit: Payer: Self-pay

## 2017-10-16 ENCOUNTER — Encounter (HOSPITAL_COMMUNITY)
Admission: RE | Disposition: A | Discharge status: Home / Self Care | Payer: Self-pay | Source: Ambulatory Visit | Attending: General Surgery

## 2017-10-16 ENCOUNTER — Ambulatory Visit (HOSPITAL_COMMUNITY): Payer: Commercial Managed Care - PPO

## 2017-10-16 ENCOUNTER — Encounter (HOSPITAL_COMMUNITY): Payer: Self-pay | Admitting: *Deleted

## 2017-10-16 ENCOUNTER — Ambulatory Visit (HOSPITAL_COMMUNITY)
Admission: RE | Admit: 2017-10-16 | Discharge: 2017-10-16 | Disposition: A | Discharge status: Home / Self Care | Payer: Commercial Managed Care - PPO | Source: Ambulatory Visit | Attending: General Surgery | Admitting: General Surgery

## 2017-10-16 DIAGNOSIS — Z880 Allergy status to penicillin: Secondary | ICD-10-CM | POA: Diagnosis not present

## 2017-10-16 DIAGNOSIS — G629 Polyneuropathy, unspecified: Secondary | ICD-10-CM | POA: Diagnosis not present

## 2017-10-16 DIAGNOSIS — I10 Essential (primary) hypertension: Secondary | ICD-10-CM | POA: Diagnosis not present

## 2017-10-16 DIAGNOSIS — Z95828 Presence of other vascular implants and grafts: Secondary | ICD-10-CM

## 2017-10-16 DIAGNOSIS — G35 Multiple sclerosis: Secondary | ICD-10-CM | POA: Diagnosis present

## 2017-10-16 DIAGNOSIS — Z79899 Other long term (current) drug therapy: Secondary | ICD-10-CM | POA: Diagnosis not present

## 2017-10-16 DIAGNOSIS — Z7982 Long term (current) use of aspirin: Secondary | ICD-10-CM | POA: Diagnosis not present

## 2017-10-16 HISTORY — DX: Personal history of urinary calculi: Z87.442

## 2017-10-16 HISTORY — PX: PORTACATH PLACEMENT: SHX2246

## 2017-10-16 SURGERY — INSERTION, TUNNELED CENTRAL VENOUS DEVICE, WITH PORT
Anesthesia: General | Site: Chest

## 2017-10-16 MED ORDER — EPHEDRINE SULFATE 50 MG/ML IJ SOLN
INTRAMUSCULAR | Status: DC | PRN
  Administered 2017-10-16: 10 mg via INTRAVENOUS
  Administered 2017-10-16: 15 mg via INTRAVENOUS
  Administered 2017-10-16: 10 mg via INTRAVENOUS
  Administered 2017-10-16 (×2): 5 mg via INTRAVENOUS

## 2017-10-16 MED ORDER — OXCARBAZEPINE 150 MG PO TABS
150.0000 mg | ORAL_TABLET | Freq: Once | ORAL | Status: DC
  Filled 2017-10-16: qty 1

## 2017-10-16 MED ORDER — LAMOTRIGINE 100 MG PO TABS
200.0000 mg | ORAL_TABLET | Freq: Once | ORAL | Status: DC
  Filled 2017-10-16: qty 2

## 2017-10-16 MED ORDER — BUPIVACAINE-EPINEPHRINE (PF) 0.5% -1:200000 IJ SOLN
INTRAMUSCULAR | Status: AC
  Filled 2017-10-16: qty 30

## 2017-10-16 MED ORDER — 0.9 % SODIUM CHLORIDE (POUR BTL) OPTIME
TOPICAL | Status: DC | PRN
  Administered 2017-10-16: 1000 mL

## 2017-10-16 MED ORDER — FENTANYL CITRATE (PF) 100 MCG/2ML IJ SOLN
INTRAMUSCULAR | Status: AC
  Filled 2017-10-16: qty 2

## 2017-10-16 MED ORDER — PHENYLEPHRINE 40 MCG/ML (10ML) SYRINGE FOR IV PUSH (FOR BLOOD PRESSURE SUPPORT)
PREFILLED_SYRINGE | INTRAVENOUS | Status: AC
  Filled 2017-10-16: qty 10

## 2017-10-16 MED ORDER — ACETAMINOPHEN 500 MG PO TABS: 1000.0000 mg | ORAL_TABLET | ORAL | Status: AC

## 2017-10-16 MED ORDER — PHENYLEPHRINE HCL 10 MG/ML IJ SOLN
INTRAMUSCULAR | Status: DC | PRN
  Administered 2017-10-16: 80 ug via INTRAVENOUS
  Administered 2017-10-16: 40 ug via INTRAVENOUS
  Administered 2017-10-16: 120 ug via INTRAVENOUS
  Administered 2017-10-16 (×2): 80 ug via INTRAVENOUS
  Administered 2017-10-16: 40 ug via INTRAVENOUS
  Administered 2017-10-16 (×2): 80 ug via INTRAVENOUS
  Administered 2017-10-16: 40 ug via INTRAVENOUS

## 2017-10-16 MED ORDER — DEXAMETHASONE SODIUM PHOSPHATE 4 MG/ML IJ SOLN
INTRAMUSCULAR | Status: DC | PRN
  Administered 2017-10-16: 10 mg via INTRAVENOUS

## 2017-10-16 MED ORDER — CIPROFLOXACIN IN D5W 400 MG/200ML IV SOLN
400.0000 mg | INTRAVENOUS | Status: AC
  Administered 2017-10-16: 400 mg via INTRAVENOUS
  Filled 2017-10-16: qty 200

## 2017-10-16 MED ORDER — HEPARIN SOD (PORK) LOCK FLUSH 100 UNIT/ML IV SOLN
INTRAVENOUS | Status: DC | PRN
  Administered 2017-10-16: 500 [IU] via INTRAVENOUS

## 2017-10-16 MED ORDER — CHLORHEXIDINE GLUCONATE CLOTH 2 % EX PADS: 6.0000 | MEDICATED_PAD | Freq: Once | CUTANEOUS | Status: DC

## 2017-10-16 MED ORDER — MIDAZOLAM HCL 5 MG/5ML IJ SOLN
INTRAMUSCULAR | Status: DC | PRN
  Administered 2017-10-16: 2 mg via INTRAVENOUS

## 2017-10-16 MED ORDER — DEXAMETHASONE SODIUM PHOSPHATE 10 MG/ML IJ SOLN
INTRAMUSCULAR | Status: AC
  Filled 2017-10-16: qty 1

## 2017-10-16 MED ORDER — GABAPENTIN 300 MG PO CAPS: 300.0000 mg | ORAL_CAPSULE | ORAL | Status: AC

## 2017-10-16 MED ORDER — FENTANYL CITRATE (PF) 100 MCG/2ML IJ SOLN
INTRAMUSCULAR | Status: DC | PRN
  Administered 2017-10-16: 50 ug via INTRAVENOUS

## 2017-10-16 MED ORDER — EPHEDRINE 5 MG/ML INJ
INTRAVENOUS | Status: AC
  Filled 2017-10-16: qty 10

## 2017-10-16 MED ORDER — MIDAZOLAM HCL 2 MG/2ML IJ SOLN
INTRAMUSCULAR | Status: AC
  Filled 2017-10-16: qty 2

## 2017-10-16 MED ORDER — LIDOCAINE 2% (20 MG/ML) 5 ML SYRINGE
INTRAMUSCULAR | Status: AC
  Filled 2017-10-16: qty 5

## 2017-10-16 MED ORDER — HEPARIN SOD (PORK) LOCK FLUSH 100 UNIT/ML IV SOLN
INTRAVENOUS | Status: AC
  Filled 2017-10-16: qty 5

## 2017-10-16 MED ORDER — PROPOFOL 10 MG/ML IV BOLUS
INTRAVENOUS | Status: AC
  Filled 2017-10-16: qty 20

## 2017-10-16 MED ORDER — LIDOCAINE-EPINEPHRINE 1 %-1:100000 IJ SOLN
INTRAMUSCULAR | Status: AC
  Filled 2017-10-16: qty 1

## 2017-10-16 MED ORDER — LIDOCAINE HCL (CARDIAC) 20 MG/ML IV SOLN
INTRAVENOUS | Status: DC | PRN
  Administered 2017-10-16: 50 mg via INTRAVENOUS

## 2017-10-16 MED ORDER — TRAMADOL HCL 50 MG PO TABS: 50.0000 mg | ORAL_TABLET | Freq: Three times a day (TID) | ORAL | 0 refills | Status: DC | PRN

## 2017-10-16 MED ORDER — SODIUM CHLORIDE 0.9 % IV SOLN
Freq: Once | INTRAVENOUS | Status: AC
Start: 2017-10-16 — End: 2017-10-16
  Administered 2017-10-16: 14:00:00
  Filled 2017-10-16: qty 1.2

## 2017-10-16 MED ORDER — PROPOFOL 10 MG/ML IV BOLUS
INTRAVENOUS | Status: DC | PRN
  Administered 2017-10-16: 150 mg via INTRAVENOUS

## 2017-10-16 MED ORDER — ONDANSETRON HCL 4 MG/2ML IJ SOLN
INTRAMUSCULAR | Status: AC
  Filled 2017-10-16: qty 2

## 2017-10-16 MED ORDER — GLYCOPYRROLATE 0.2 MG/ML IJ SOLN
INTRAMUSCULAR | Status: DC | PRN
  Administered 2017-10-16: 0.2 mg via INTRAVENOUS
  Administered 2017-10-16: 0.1 mg via INTRAVENOUS

## 2017-10-16 MED ORDER — LACTATED RINGERS IV SOLN
INTRAVENOUS | Status: DC
  Administered 2017-10-16: 11:00:00 via INTRAVENOUS

## 2017-10-16 MED ORDER — BACLOFEN 5 MG HALF TABLET
15.0000 mg | ORAL_TABLET | Freq: Once | ORAL | Status: DC
  Filled 2017-10-16: qty 1

## 2017-10-16 MED ORDER — GLYCOPYRROLATE 0.2 MG/ML IV SOSY
PREFILLED_SYRINGE | INTRAVENOUS | Status: AC
  Filled 2017-10-16: qty 5

## 2017-10-16 MED ORDER — BUPIVACAINE-EPINEPHRINE (PF) 0.5% -1:200000 IJ SOLN
INTRAMUSCULAR | Status: DC | PRN
  Administered 2017-10-16: 10 mL via PERINEURAL

## 2017-10-16 MED ORDER — FENTANYL CITRATE (PF) 100 MCG/2ML IJ SOLN
25.0000 ug | INTRAMUSCULAR | Status: DC | PRN
  Administered 2017-10-16 (×2): 50 ug via INTRAVENOUS

## 2017-10-16 MED ORDER — ENSURE PRE-SURGERY PO LIQD
296.0000 mL | Freq: Once | ORAL | Status: DC
  Filled 2017-10-16: qty 296

## 2017-10-16 SURGICAL SUPPLY — 35 items
BAG DECANTER FOR FLEXI CONT (MISCELLANEOUS) ×2 IMPLANT
BENZOIN TINCTURE PRP APPL 2/3 (GAUZE/BANDAGES/DRESSINGS) ×2 IMPLANT
BLADE SURG 15 STRL LF DISP TIS (BLADE) ×1 IMPLANT
BLADE SURG 15 STRL SS (BLADE) ×1
BLADE SURG SZ11 CARB STEEL (BLADE) ×2 IMPLANT
CHLORAPREP W/TINT 26ML (MISCELLANEOUS) ×2 IMPLANT
COVER SURGICAL LIGHT HANDLE (MISCELLANEOUS) ×2 IMPLANT
DECANTER SPIKE VIAL GLASS SM (MISCELLANEOUS) ×2 IMPLANT
DERMABOND ADVANCED (GAUZE/BANDAGES/DRESSINGS) ×1
DERMABOND ADVANCED .7 DNX12 (GAUZE/BANDAGES/DRESSINGS) ×1 IMPLANT
DRAPE C-ARM 42X120 X-RAY (DRAPES) ×2 IMPLANT
DRAPE LAPAROSCOPIC ABDOMINAL (DRAPES) ×2 IMPLANT
ELECT PENCIL ROCKER SW 15FT (MISCELLANEOUS) ×2 IMPLANT
ELECT REM PT RETURN 15FT ADLT (MISCELLANEOUS) ×2 IMPLANT
GAUZE SPONGE 4X4 12PLY STRL (GAUZE/BANDAGES/DRESSINGS) IMPLANT
GAUZE SPONGE 4X4 16PLY XRAY LF (GAUZE/BANDAGES/DRESSINGS) ×2 IMPLANT
GLOVE BIOGEL PI IND STRL 7.0 (GLOVE) ×1 IMPLANT
GLOVE BIOGEL PI INDICATOR 7.0 (GLOVE) ×1
GLOVE SURG SS PI 7.0 STRL IVOR (GLOVE) ×2 IMPLANT
GOWN STRL REUS W/TWL LRG LVL3 (GOWN DISPOSABLE) ×2 IMPLANT
GOWN STRL REUS W/TWL XL LVL3 (GOWN DISPOSABLE) ×4 IMPLANT
KIT BASIN OR (CUSTOM PROCEDURE TRAY) ×2 IMPLANT
KIT PORT POWER 8FR ISP CVUE (Miscellaneous) ×2 IMPLANT
NEEDLE HYPO 22GX1.5 SAFETY (NEEDLE) ×2 IMPLANT
PACK BASIC VI WITH GOWN DISP (CUSTOM PROCEDURE TRAY) ×2 IMPLANT
SUT MNCRL AB 4-0 PS2 18 (SUTURE) ×2 IMPLANT
SUT VIC AB 2-0 SH 18 (SUTURE) IMPLANT
SUT VIC AB 2-0 SH 27 (SUTURE)
SUT VIC AB 2-0 SH 27X BRD (SUTURE) IMPLANT
SUT VIC AB 3-0 SH 27 (SUTURE) ×1
SUT VIC AB 3-0 SH 27XBRD (SUTURE) ×1 IMPLANT
SYR 10ML LL (SYRINGE) ×2 IMPLANT
SYR 20CC LL (SYRINGE) ×2 IMPLANT
TOWEL OR 17X26 10 PK STRL BLUE (TOWEL DISPOSABLE) ×2 IMPLANT
TOWEL OR NON WOVEN STRL DISP B (DISPOSABLE) ×2 IMPLANT

## 2017-10-16 NOTE — Transfer of Care (Addendum)
Immediate Anesthesia Transfer of Care Note  [name]  Procedure(s) Performed: Procedure(s): ULTRA SOUND GUIDED INSERTION PORT-A-CATH ERAS PATHWAY (N/A)  Patient Location: PACU  Anesthesia Type:General  Level of Consciousness: Patient easily awoken, sedated, comfortable, cooperative, following commands, responds to stimulation.   Airway & Oxygen Therapy: Patient spontaneously breathing, ventilating well, oxygen via simple oxygen mask.  Post-op Assessment: Report given to PACU RN, vital signs reviewed and stable.   Post vital signs: Reviewed and stable.  Complications: No apparent anesthesia complications  Last Vitals:  Vitals:   10/16/17 1012  BP: 140/77  Pulse: 66  Resp: 16  Temp: 36.9 C  SpO2: 99%    Last Pain:  Vitals:   10/16/17 1012  TempSrc: Oral      Patients Stated Pain Goal: 3 (44/69/50 7225)  Complications: No apparent anesthesia complications

## 2017-10-16 NOTE — Anesthesia Postprocedure Evaluation (Signed)
Anesthesia Post Note  [name]  Procedure(s) Performed: ULTRA SOUND GUIDED INSERTION PORT-A-CATH ERAS PATHWAY (N/A Chest)     Patient location during evaluation: PACU Anesthesia Type: General Level of consciousness: awake and alert Pain management: pain level controlled Vital Signs Assessment: post-procedure vital signs reviewed and stable Respiratory status: spontaneous breathing, nonlabored ventilation, respiratory function stable and patient connected to nasal cannula oxygen Cardiovascular status: blood pressure returned to baseline and stable Postop Assessment: no apparent nausea or vomiting Anesthetic complications: no    Last Vitals:  Vitals:   10/16/17 1530 10/16/17 1545  BP: (!) 144/86 (!) 142/80  Pulse: 85 79  Resp: 15 13  Temp:    SpO2: 100% 100%    Last Pain:  Vitals:   10/16/17 1012  TempSrc: Oral                 Dmonte Maher EDWARD

## 2017-10-16 NOTE — Anesthesia Procedure Notes (Signed)
Procedure Name: LMA Insertion Date/Time: 10/16/2017 1:41 PM Performed by: Deliah Boston, CRNA Pre-anesthesia Checklist: Patient identified, Emergency Drugs available, Suction available and Patient being monitored Patient Re-evaluated:Patient Re-evaluated prior to induction Oxygen Delivery Method: Circle system utilized Preoxygenation: Pre-oxygenation with 100% oxygen Induction Type: IV induction Ventilation: Mask ventilation without difficulty LMA: LMA with gastric port inserted LMA Size: 4.0 Number of attempts: 1 Placement Confirmation: positive ETCO2 and breath sounds checked- equal and bilateral Tube secured with: Tape Dental Injury: Teeth and Oropharynx as per pre-operative assessment

## 2017-10-16 NOTE — Anesthesia Preprocedure Evaluation (Signed)
Anesthesia Evaluation  Patient identified by MRN, date of birth, ID band Patient awake    Reviewed: Allergy & Precautions, H&P , Patient's Chart, lab work & pertinent test results, reviewed documented beta blocker date and time   Airway Mallampati: II  TM Distance: >3 FB Neck ROM: full    Dental no notable dental hx.    Pulmonary    Pulmonary exam normal breath sounds clear to auscultation       Cardiovascular hypertension,  Rhythm:regular Rate:Normal     Neuro/Psych  Neuromuscular disease    GI/Hepatic   Endo/Other    Renal/GU      Musculoskeletal   Abdominal   Peds  Hematology   Anesthesia Other Findings MS with spastic quadraparesis HTN Hx of CHF   Reproductive/Obstetrics                             Anesthesia Physical Anesthesia Plan  ASA: III  Anesthesia Plan: General   Post-op Pain Management:    Induction: Intravenous  PONV Risk Score and Plan: 2 and Treatment may vary due to age or medical condition, Dexamethasone and Ondansetron  Airway Management Planned: LMA  Additional Equipment:   Intra-op Plan:   Post-operative Plan:   Informed Consent: I have reviewed the patients History and Physical, chart, labs and discussed the procedure including the risks, benefits and alternatives for the proposed anesthesia with the patient or authorized representative who has indicated his/her understanding and acceptance.   Dental Advisory Given  Plan Discussed with: CRNA and Surgeon  Anesthesia Plan Comments: ( )        Anesthesia Quick Evaluation

## 2017-10-16 NOTE — Op Note (Signed)
Preoperative diagnosis: multiple sclerosis  Postoperative diagnosis: multiple sclerosis  Procedure: insertion of right internal jugular port-a-cath with ultrasound and fluoro guidance  Surgeon: Gurney Maxin, M.D.  Asst: none  Anesthesia: gen   Indications for procedure: 52 yo female with difficult IV access is starting a new infusion based medication  Description of procedure: The patient was brought into the operative suite, anesthesia was administered with LMA, both arms were tucked with offloading foam over all pressure points. The patient was prepped and draped in the usual sterile fashion. Next WHO checklist was completed. The patient was put in trendelenberg, the [place] was used to assess anatomy and the RIJ was large and lay lateral to the common carotid artery A 18ga needle was used to gain access to the RIJ using ultrasound guidance. Nonpulsatile flow was seen and the J-wire was advanced without tension. XR was used to confirm placement within the venous system. No arrthymias were seen, the wire was clamped in place. The percutaneous access site was widened with a 11 blade. Local anesthesia was used to anesthetize the space inferior to the right clavicle. Next a 3cm incision was made 3cm inferior to the clavicle. Cautery was used to create a pocket along the fascia inferior to incision. The tunneling device was used to make a wide turn from the pocket up to the perc access site. The introducer and sheath were then thread over the J wire in seldinger technique and wire was removed. Next the catheter was thread from the incision to the access site and inserted into the sheath after the introducer was removed. Sheath was then pulled and removed. XR showed the tip of catheter to be at the atrial-caval junction. The port was then attached to the catheter cutting the catheter at appropriate length. The port was then placed in pocket and sewed to the fascia in two places with a 2-0 prolene. The port  was accessed with huber needel and flushed and drew easily and the port was flushed with concentrated heparin. The incision was closed with 3-0 vicryl followed with 4-0 monocryl in running subcu fashion. A single 4-0 was used to close the access site. Dermabond was placed for dressing. Patient was extubated and brought to pacu in stable condition.  Findings: patent RIJ, catheter tip at the SVC atrial junction  Specimen: none  Blood loss: 10cc  Local anesthesia: 10cc 0.25% marcaine w epi  Complications: none  Gurney Maxin, M.D. General, Bariatric, & Minimally Invasive Surgery [facility]

## 2017-10-16 NOTE — H&P (Signed)
Vanessa Short is an 52 y.o. female.   Chief Complaint: Multiple sclerosis HPI: 52 yo female with long history of MS presents for port to begin new infusion medication. She has had no VTEs or CVLs in the past.  Past Medical History:  Diagnosis Date  . Headache   . History of kidney stones   . Hypertension   . Kidney stones   . Movement disorder   . Multiple sclerosis ([place])   . Neuropathy   . Ulcerative colitis ([place])   . Vision abnormalities     Past Surgical History:  Procedure Laterality Date  . ENDOMETRIAL ABLATION  10/10/2017  . KIDNEY STONE SURGERY      Family History  Problem Relation Age of Onset  . Dementia Mother   . Hypertension Father   . Hyperlipidemia Father   . Diabetes Father   . Heart disease Father   . Breast cancer Neg Hx    Social History:  reports that  has never smoked. she has never used smokeless tobacco. She reports that she does not drink alcohol or use drugs.  Allergies:  Allergies  Allergen Reactions  . Oxycodone-Acetaminophen Other (See Comments)    Other  . Penicillins Rash    Has patient had a PCN reaction causing immediate rash, facial/tongue/throat swelling, SOB or lightheadedness with hypotension: NO Has patient had a PCN reaction causing severe rash involving mucus membranes or skin necrosis: NO Has patient had a PCN reaction that required hospitalization NO Has patient had a PCN reaction occurring within the last 10 years:NO If all of the above answers are "NO", then may proceed with Cephalosporin use.    Medications Prior to Admission  Medication Sig Dispense Refill  . acetaminophen (TYLENOL) 500 MG tablet Take 500 mg by mouth every 6 (six) hours as needed for mild pain or moderate pain.    Marland Kitchen acyclovir (ZOVIRAX) 400 MG tablet Take 400 mg by mouth daily. Take every day per patient  0  . amitriptyline (ELAVIL) 10 MG tablet One or two at bedtime (Patient taking differently: Take 10 mg by mouth at bedtime. ) 180 tablet 3  . aspirin EC  81 MG tablet Take 81 mg by mouth daily.    Marland Kitchen b complex vitamins tablet Take 1 tablet by mouth daily.     . baclofen (LIORESAL) 10 MG tablet TAKE 1 TABLET UP TO 6 TIMES DAILY (Patient taking differently: Take 15-20 mg by mouth See admin instructions. 15 mg three times daily, and 20 mg at bedtime) 180 tablet 1  . cholecalciferol (VITAMIN D) 1000 UNITS tablet Take 1,000 Units by mouth daily.     . dantrolene (DANTRIUM) 50 MG capsule Take 1 capsule (50 mg total) by mouth 3 (three) times daily. 270 capsule 3  . dronabinol (MARINOL) 2.5 MG capsule Take 1 capsule (2.5 mg total) by mouth 2 (two) times daily before a meal. 60 capsule 5  . FLUoxetine (PROZAC) 40 MG capsule Take 1 capsule (40 mg total) by mouth daily. 90 capsule 3  . folic acid (FOLVITE) 809 MCG tablet Take 400 mcg by mouth daily.     . furosemide (LASIX) 40 MG tablet Take 1 tablet (40 mg total) by mouth 2 (two) times daily. 30 tablet   . hydrocortisone cream 1 % Apply 1 application topically 2 (two) times daily as needed for itching.    Marland Kitchen ibuprofen (ADVIL,MOTRIN) 200 MG tablet Take 200 mg by mouth 3 (three) times daily.     Marland Kitchen labetalol (NORMODYNE)  100 MG tablet Take 100 mg by mouth daily.    Marland Kitchen lamoTRIgine (LAMICTAL) 200 MG tablet TAKE 1 TABLET (200 MG TOTAL) BY MOUTH 3 (THREE) TIMES DAILY. 270 tablet 3  . mesalamine (PENTASA) 500 MG CR capsule Take 1,000 mg by mouth 3 (three) times daily.     . Multiple Vitamin (MULTIVITAMIN) tablet Take 1 tablet by mouth daily.    . natalizumab (TYSABRI) 300 MG/15ML injection Inject 300 mg into the vein every 28 (twenty-eight) days.    Marland Kitchen omeprazole (PRILOSEC) 40 MG capsule Take 40 mg by mouth 2 (two) times daily.    . OXcarbazepine (TRILEPTAL) 150 MG tablet Take 1 tablet (150 mg total) by mouth 3 (three) times daily. 270 tablet 0  . sodium chloride (OCEAN) 0.65 % SOLN nasal spray Place 1 spray into both nostrils as needed for congestion.  0  . solifenacin (VESICARE) 10 MG tablet Take 10 mg by mouth daily.      Marland Kitchen zinc oxide 20 % ointment Apply 1 application topically 3 (three) times daily. Apply to bilateral buttocks every shift as a preventative measure      No results found for this or any previous visit (from the past 48 hour(s)). No results found.  Review of Systems  Constitutional: Negative for chills and fever.  HENT: Negative for hearing loss.   Eyes: Negative for blurred vision and double vision.  Respiratory: Negative for cough and hemoptysis.   Cardiovascular: Negative for chest pain and palpitations.  Gastrointestinal: Negative for abdominal pain, nausea and vomiting.  Genitourinary: Negative for dysuria and urgency.  Musculoskeletal: Positive for myalgias. Negative for neck pain.  Skin: Negative for itching and rash.  Neurological: Positive for focal weakness. Negative for dizziness, tingling and headaches.  Endo/Heme/Allergies: Does not bruise/bleed easily.  Psychiatric/Behavioral: Negative for depression and suicidal ideas.    Blood pressure 140/77, pulse 66, temperature 98.5 F (36.9 C), temperature source Oral, resp. rate 16, height 5' 3"  (1.6 m), weight 79.4 kg (175 lb), SpO2 99 %. Physical Exam  Vitals reviewed. Constitutional: She is oriented to person, place, and time. She appears well-developed and well-nourished.  HENT:  Head: Normocephalic and atraumatic.  Eyes: Conjunctivae and EOM are normal. Pupils are equal, round, and reactive to light.  Neck: Normal range of motion. Neck supple.  Cardiovascular: Normal rate and regular rhythm.  Respiratory: Effort normal and breath sounds normal.  GI: Soft. Bowel sounds are normal. She exhibits no distension. There is no tenderness.  Musculoskeletal:  Weakness bilateral lower extremities, upper extremities limited to extension of shoulders and extension of hands  Neurological: She is alert and oriented to person, place, and time.  Skin: Skin is warm and dry.  Psychiatric: She has a normal mood and affect. Her behavior is  normal.     Assessment/Plan 52 yo female with MS -port o cath insertion  Mickeal Skinner, MD 10/16/2017, 12:22 PM

## 2017-10-17 ENCOUNTER — Telehealth: Payer: Self-pay | Admitting: *Deleted

## 2017-10-17 ENCOUNTER — Encounter (HOSPITAL_COMMUNITY): Payer: Self-pay | Admitting: General Surgery

## 2017-10-17 MED ORDER — BACLOFEN 10 MG PO TABS: ORAL_TABLET | ORAL | 1 refills | Status: DC

## 2017-10-17 NOTE — Telephone Encounter (Signed)
Baclofen escribed toi [facility] in response to faxed request from them/fim

## 2017-10-19 ENCOUNTER — Other Ambulatory Visit: Payer: Self-pay | Admitting: Neurology

## 2017-10-25 IMAGING — MR MR HEAD WO/W CM
11 of 15 series · 28 of 48 positions shown · IV contrast (Yes   MULTIHANCE)
Comparison: 08/17/2016 and multiple previous including 08/05/2016
and 01/24/2015.

CLINICAL DATA: Multiple sclerosis diagnosed more than 30 years ago.
Acute onset of confusion and speech disturbance last night.

EXAM:
MRI HEAD WITHOUT AND WITH CONTRAST
TECHNIQUE: Multiplanar, multiecho pulse sequences of the brain and surrounding
structures were obtained without and with intravenous contrast.
CONTRAST:  16mL MULTIHANCE GADOBENATE DIMEGLUMINE 529 MG/ML IV SOLN

[Series 3: T1 · sagittal · 5.0mm · 0.47mm/px · 1 of 24 slices shown]
[im 1/24]
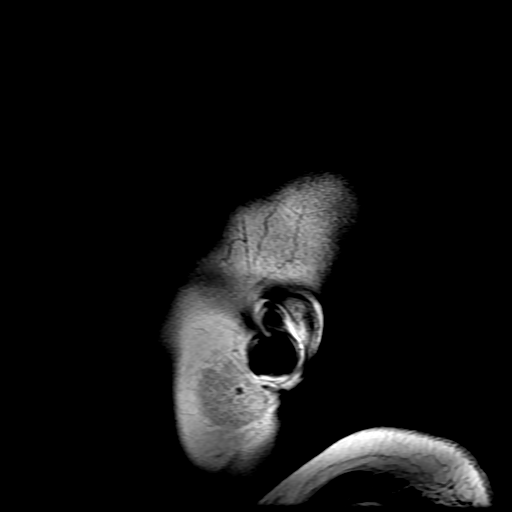

[Series 4: DWI · axial · 3.0mm · 1.09mm/px · z∈[-57,+74]mm · 5 of 90 slices shown (1 of 4)]
[im 1/90]
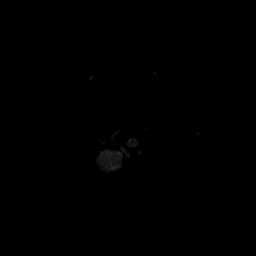
[im 23/90]
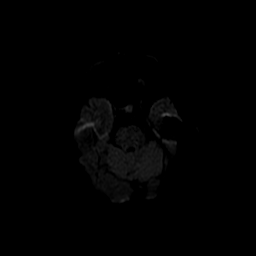
[im 45/90]
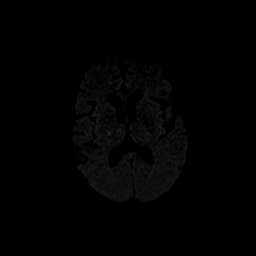
[im 67/90]
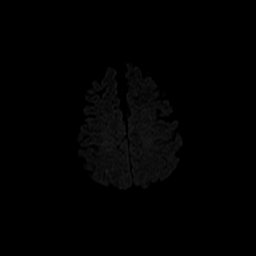
[im 90/90]
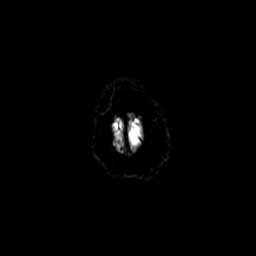

[Series 5: T2 · axial · 5.0mm · 0.43mm/px · 1 of 22 slices shown]
[im 1/22]
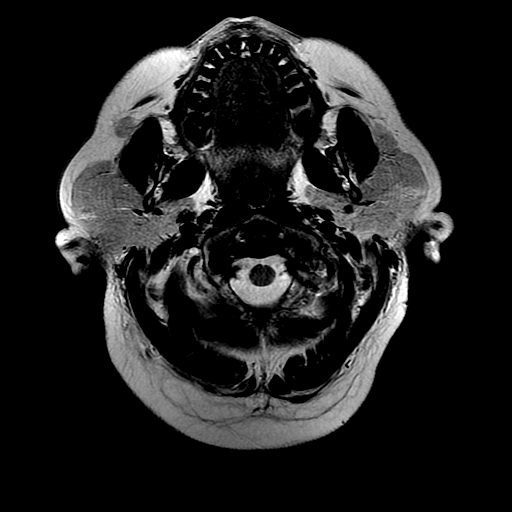

[Series 6: FLAIR · axial · 5.0mm · 0.43mm/px · 1 of 22 slices shown (1 of 2)]
[im 1/22]
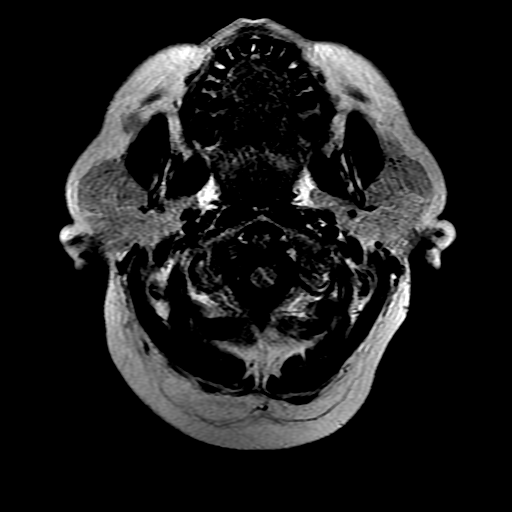

[Series 7: FLAIR · sagittal · 1.2mm · 0.49mm/px · 9 of 272 slices shown (2 of 2)]
[im 1/272]
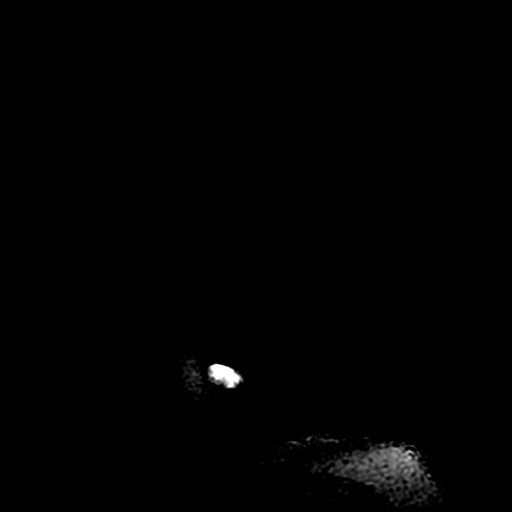
[im 39/272]
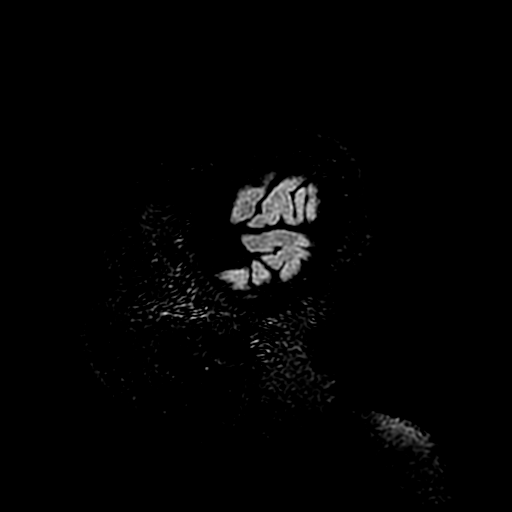
[im 78/272]
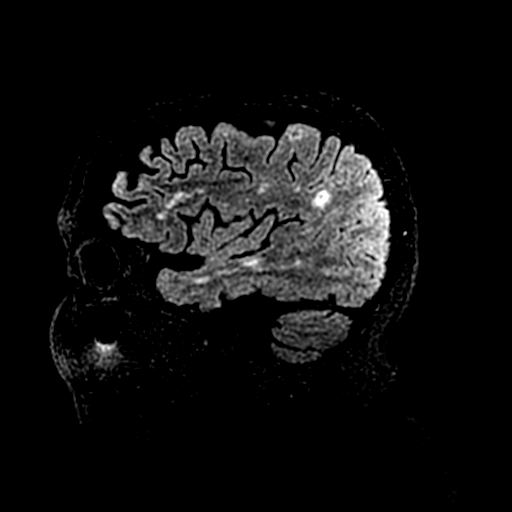
[im 117/272]
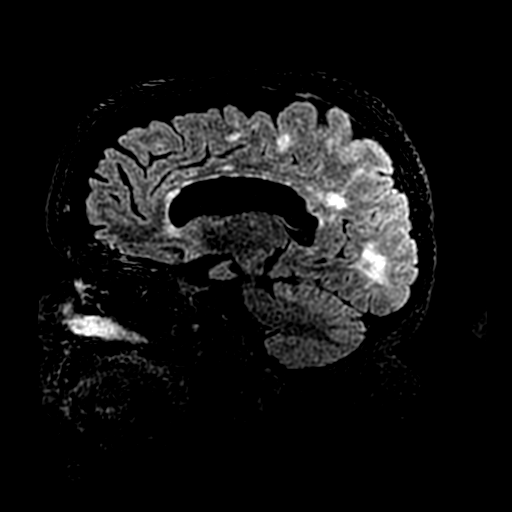
[im 136/272]
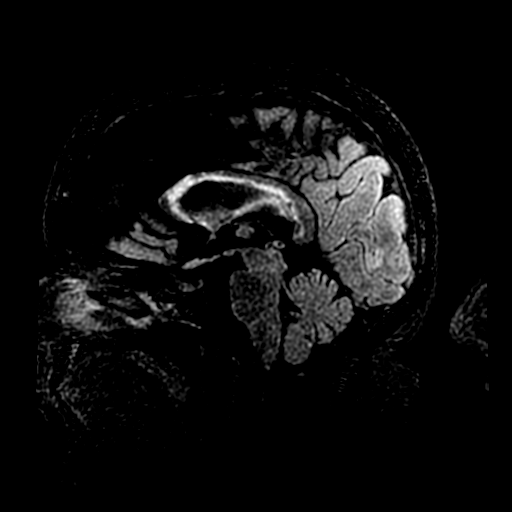
[im 155/272]
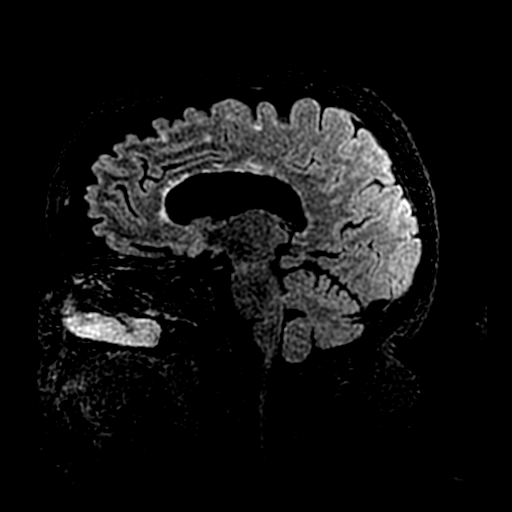
[im 194/272]
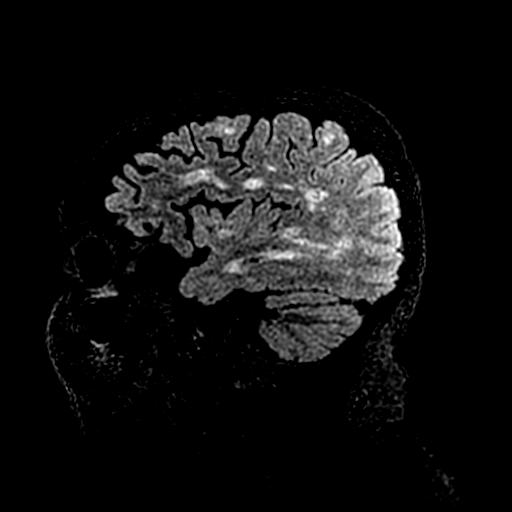
[im 233/272]
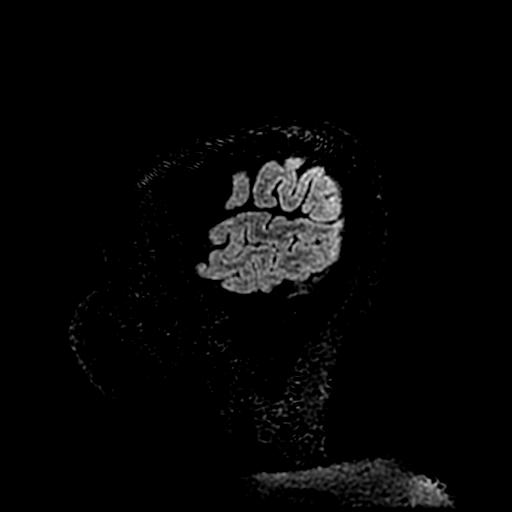
[im 272/272]
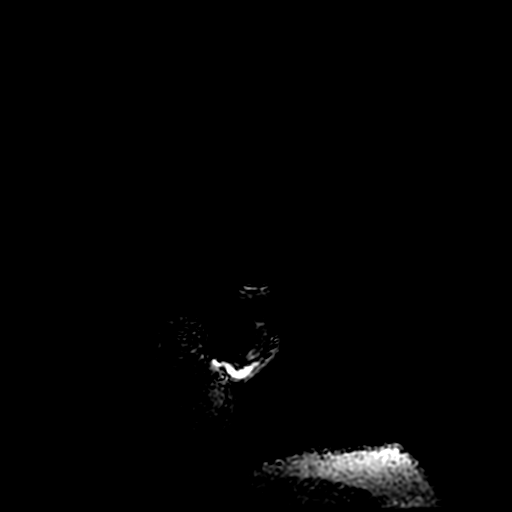

[Series 8: DWI · coronal · 5.0mm · 1.09mm/px · 4 of 66 slices shown (2 of 4)]
[im 1/66]
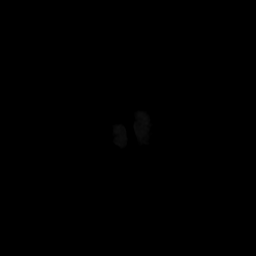
[im 22/66]
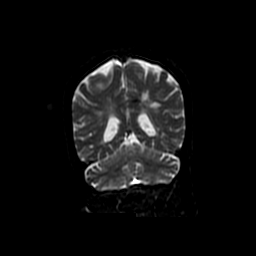
[im 44/66]
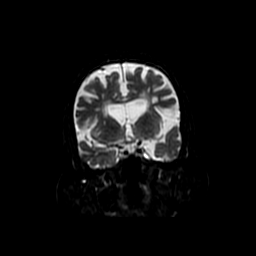
[im 66/66]
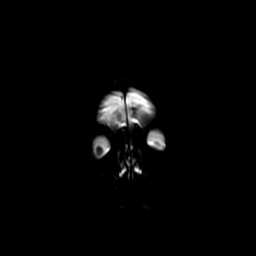

[Series 9: ax mpgr · axial · 5.0mm · 0.43mm/px · 1 of 22 slices shown]
[im 1/22]
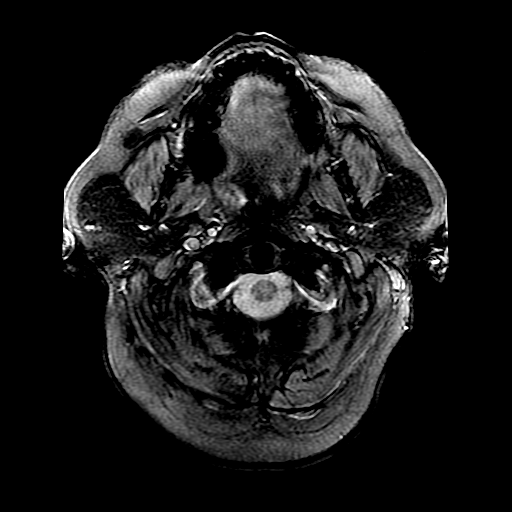

[Series 11: T2 post-contrast · coronal · 5.0mm · 0.39mm/px · 1 of 27 slices shown]
[im 1/27]
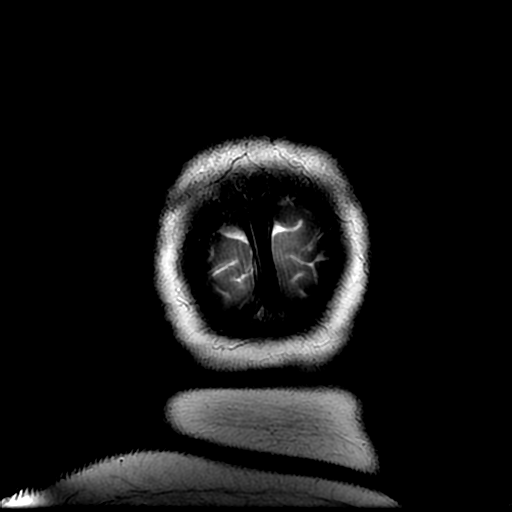

[Series 13: T1 post-contrast · coronal · 5.0mm · 0.43mm/px · 1 of 25 slices shown]
[im 1/25]
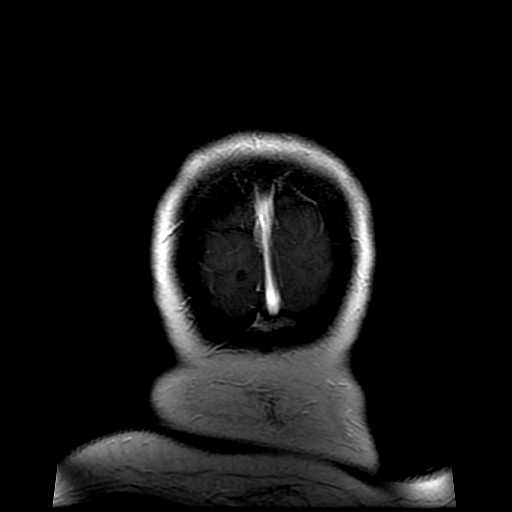

[Series 400: DWI · axial · 3.0mm · 1.09mm/px · z∈[-57,+74]mm · 2 of 45 slices shown (3 of 4)]
[im 1/45]
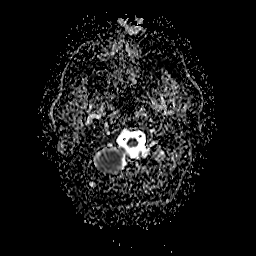
[im 45/45]
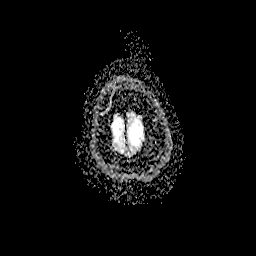

[Series 800: DWI · coronal · 5.0mm · 1.09mm/px · 2 of 33 slices shown (4 of 4)]
[im 1/33]
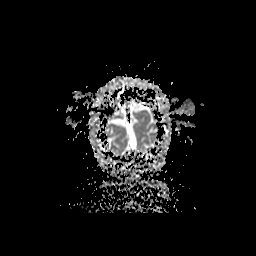
[im 33/33]
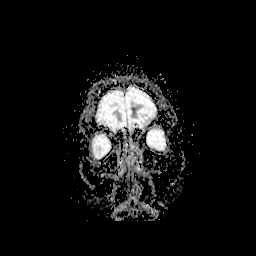

[28 of 48 positions shown; findings below may reference images not displayed]

FINDINGS: Brain: Re- demonstrated are widespread confluent abnormal FLAIR and
T2 signal lesions throughout the cerebral hemispheric white matter
consistent with the clinical diagnosis of chronic multiple
sclerosis. There is deep in subcortical involvement. There are no
newly developed foci when compared to the previous studies. Again,
several of the areas show restricted diffusion, most notable in the
right occipital subcortical white matter and posterior limb internal
capsule on the right. No evidence of mass lesion, hemorrhage or
obstructive hydrocephalus. Chronic ventricular prominence relates to
central atrophy. After contrast administration, no abnormal
enhancement occurs. Incidental anterior middle cranial fossa
arachnoid cysts, left larger than right as seen previously.

Vascular: Major vessels at the base of the brain show flow.

Skull and upper cervical spine: Negative

Sinuses/Orbits: Clear/normal

Other: None significant
IMPRESSION: No change since [REDACTED]. Confluent white matter disease throughout
the cerebral hemispheres consistent with chronic multiple sclerosis.
Several foci show restricted diffusion, but no contrast enhancement.
Whereas theoretically ischemic infarction could be hidden within
this extensive demyelinating disease, there is no specific reason to
suggest that based on these images.

## 2017-10-31 ENCOUNTER — Other Ambulatory Visit: Payer: Self-pay

## 2017-10-31 ENCOUNTER — Encounter: Payer: Self-pay | Admitting: Neurology

## 2017-10-31 ENCOUNTER — Ambulatory Visit (INDEPENDENT_AMBULATORY_CARE_PROVIDER_SITE_OTHER): Payer: Commercial Managed Care - PPO | Admitting: Neurology

## 2017-10-31 VITALS — BP 155/94 | HR 77 | Resp 16 | Ht 63.0 in

## 2017-10-31 DIAGNOSIS — R208 Other disturbances of skin sensation: Secondary | ICD-10-CM | POA: Diagnosis not present

## 2017-10-31 DIAGNOSIS — G35 Multiple sclerosis: Secondary | ICD-10-CM | POA: Diagnosis not present

## 2017-10-31 DIAGNOSIS — F329 Major depressive disorder, single episode, unspecified: Secondary | ICD-10-CM | POA: Diagnosis not present

## 2017-10-31 DIAGNOSIS — Z79899 Other long term (current) drug therapy: Secondary | ICD-10-CM

## 2017-10-31 DIAGNOSIS — G8250 Quadriplegia, unspecified: Secondary | ICD-10-CM

## 2017-10-31 DIAGNOSIS — F32A Depression, unspecified: Secondary | ICD-10-CM

## 2017-10-31 DIAGNOSIS — R441 Visual hallucinations: Secondary | ICD-10-CM

## 2017-10-31 DIAGNOSIS — Z993 Dependence on wheelchair: Secondary | ICD-10-CM

## 2017-10-31 DIAGNOSIS — R5383 Other fatigue: Secondary | ICD-10-CM | POA: Diagnosis not present

## 2017-10-31 MED ORDER — OXCARBAZEPINE 150 MG PO TABS: 150.0000 mg | ORAL_TABLET | Freq: Three times a day (TID) | ORAL | 3 refills | Status: DC

## 2017-10-31 MED ORDER — FLUOXETINE HCL 40 MG PO CAPS: 40.0000 mg | ORAL_CAPSULE | Freq: Every day | ORAL | 3 refills | Status: DC

## 2017-10-31 MED ORDER — BACLOFEN 10 MG PO TABS: ORAL_TABLET | ORAL | 3 refills | Status: DC

## 2017-10-31 NOTE — Progress Notes (Signed)
[facility]  [name] DOB: April 09, 1966  REFERRING CLINICIAN: Adah Salvage, [facility] HISTORY FROM: patient  REASON FOR VISIT: MS   HISTORICAL  CHIEF COMPLAINT:  Chief Complaint  Patient presents with  . Multiple Sclerosis    Has had 2 Tysabri infusions (last one 10/30/17).  JCV ab last checked 07/03/17 and was indeterminate at 0.35.  Inhibition assay Positive.  Sts. she has more movement in her hands, less fatigue--doesn't nod off as much during the day/fim    HISTORY OF PRESENT ILLNESS:  [name] is a 52 year old woman diagnosed with MS in 1989.     Update 10/31/2017: She feels her MS is stable. .  She has had 2 Tysabri infusions with the first 1 in February and the last one yesterday.  JCV antibody is indeterminate at 0.35 but the inhibition assay was positive.  We have discussed that there is a small risk of PML that could increase the longer she is on the medication.  She does feel that her fatigue has done better since starting Tysabri and she feels that her hands are doing better as well.    She has severe weakness in the legs with spasticity. Spasticity is worse when anxious.    She is taking baclofen 5-6 pills (10 mg each) daily. She took Marinol 2.5 mg but had excessive lethargy and quit.   That has helped her some.  Dysesthesias are much better and she is only on Trileptal now (stopped lamotrigine and amitriptyline).    Bladder function is doing well on Vesicare.  Amitiza helps constipation.   She denies any major problems with her vision but will sometimes have diplopia when she looks to the left.  She has had issues with fatigue though it is better on the Tysabri.  Most nights, she will fall asleep well but will often wake up.  This is worse since her GYN surgery and going through menopause.  She has had some depression that is fairly stable.  In the past she was having visual hallucinations sometimes seeing her husband when  he is not around.      Update 07/03/2017:    Her MS is doing about the same.   She does not note any definite new symptoms or exacerbation but she does feel that her arm strength, much worse with her pneumonia and has not returned to baseline. Spasticity is still a problem in the legs.     For spasticity,she is on baclofen 10 mg po 5-6 pills a day.      She was taking Marinol 2.5 mg bid but felt a little out of it and backed off to just once a day which is more beneficial for her.   Bladder is doing ok on Vesicare.   When she looks to the far left she notes diplopia and she shuts her left eye to avoid.    She notes some fatigue but this is about the same as at the last visit. She is sleeping well most nights but will wake up more if her husbands snoring is loud.    She has depression. He feels this is stable patient does well. She is not having as many hallucinations.  From 02/27/2017: MS:   She is on Aubagio,  she tolerates it well. She has noted some worsening while on the medication clinically. In the past, we have discussed ocrelizumab.  Gait/Strength/sensation:  Legs are doing the same with severe weakness and spasticity.  Dantrolene has  not helped much for spasticity.   She takes baclofen 4.5 pills a day and we discussed increasing this and decreasing the dantrolene.   Spasms often occur when she lays down in bed.   She has dysesthetic nerve pain in her legs, helped by amitriptyline and lamotrigine.     In the past, we tried Botox injections into the left arm but  Botox did not improve functioning.   Writing has become more difficulty. She also has had more problems controlling her wheelchair and has had control on her current wheelchair.    Sensation/pain:  She has right anterior shoulder region dysesthesia but this is milder than last year.  Gabapentin was not tolerated.    Lamotrigine 150 mg po bid with amitriptyline helped with dysesthesia at first but less now.   We just went up on the  lamotrigine.    Vision:   Vision is about the same.  She has mild decreased acuity and occasional diplopia.        Bladder/Bowel:  She has bladder dysfunction with some benefit form Vesicare.   She more incontinence and always uses Depends.  She is on Linzess due to many of her med's making constipation worse.  She also has ulcerative colitis and requires frequent colonoscopies  Fatigue/sleep:   She has daily fatigue .   She has some insomnia at times.     She has nocturia at night and sleep is variable.    Hallucinations:  Prior to the hospitalization, she would get occasional mild visual hallucinations. These are occurring more. When she was at her worse, the last week of December in the first couple days in January, she had more hallucinations and also had severe confusion.    Mood/Cognition     she notes a mild depression. She has some increased stress with some family issues such as being the executor of her father's will and  having a teenage daughter. There is mild anxiety. Earlier this year, in association with a severe infection requiring hospitalization, she had cognitive issues. Back to baseline and currently she is doing well with mild reduced attention..  MS History:  She presented with a Lhermite's syndrome in 1988 or 1989.   MRIs of the head and neck were performed. They showed lesions consistent with the diagnosis of multiple sclerosis. Lumbar puncture was not necessary. Over the next 15 years, she would get some exacerbations and have courses of steroids. However, a disease modifying therapy was not prescribed. By 2004, she had difficulty with her gait and would often have to lean on somebody for support.  She started Copaxone that year and stayed on it for about one half years. She stopped due to a lot of itching. She then switched to Rebif. While on Copaxone Rebif she continues to have exacerbations and further difficulties with her gait. About 2012 she switched to [facility] and has been  on that medicine since. She tolerates Aubagio well but is not sure how well it is working for her.   She was off Aubagio x 2-3 weeks in 2013 and felt she did worse.  Last MRI's about 3 years ago.    REVIEW OF SYSTEMS:  Constitutional: No fevers, chills, sweats, or change in appetite.  Has fatigue and poor slep Eyes: see above.   No eye pain Ear, nose and throat: No hearing loss, ear pain, nasal congestion, sore throat Cardiovascular: No chest pain, palpitations Respiratory:  No shortness of breath at rest or with exertion.  No wheezes GastrointestinaI: Has UC - doing well.   No nausea, vomiting, diarrhea, abdominal pain, fecal incontinence Genitourinary:  see above Musculoskeletal:  No neck pain, back pain Integumentary: No rash, pruritus, skin lesions Neurological: as above Psychiatric: as above. Endocrine: No palpitations, diaphoresis, change in appetite, change in weigh or increased thirst Hematologic/Lymphatic:  No anemia, purpura, petechiae. Allergic/Immunologic: No itchy/runny eyes, nasal congestion, recent allergic reactions, rashes  ALLERGIES: Allergies  Allergen Reactions  . Oxycodone-Acetaminophen Other (See Comments)    Other  . Penicillins Rash    Has patient had a PCN reaction causing immediate rash, facial/tongue/throat swelling, SOB or lightheadedness with hypotension: NO Has patient had a PCN reaction causing severe rash involving mucus membranes or skin necrosis: NO Has patient had a PCN reaction that required hospitalization NO Has patient had a PCN reaction occurring within the last 10 years:NO If all of the above answers are "NO", then may proceed with Cephalosporin use.    HOME MEDICATIONS: Outpatient Medications Prior to Visit  Medication Sig Dispense Refill  . acyclovir (ZOVIRAX) 400 MG tablet Take 400 mg by mouth daily. Take every day per patient  0  . aspirin EC 81 MG tablet Take 81 mg by mouth daily.    Marland Kitchen b complex vitamins tablet Take 1 tablet by mouth  daily.     . cholecalciferol (VITAMIN D) 1000 UNITS tablet Take 1,000 Units by mouth daily.     . dantrolene (DANTRIUM) 50 MG capsule TAKE 1 CAPSULE (50 MG TOTAL) BY MOUTH 3 (THREE) TIMES DAILY. 659 capsule 3  . folic acid (FOLVITE) 935 MCG tablet Take 400 mcg by mouth daily.     . furosemide (LASIX) 40 MG tablet Take 1 tablet (40 mg total) by mouth 2 (two) times daily. 30 tablet   . hydrocortisone cream 1 % Apply 1 application topically 2 (two) times daily as needed for itching.    Marland Kitchen ibuprofen (ADVIL,MOTRIN) 200 MG tablet Take 200 mg by mouth 3 (three) times daily.     Marland Kitchen labetalol (NORMODYNE) 100 MG tablet Take 100 mg by mouth daily.    . mesalamine (PENTASA) 500 MG CR capsule Take 1,000 mg by mouth 3 (three) times daily.     . Multiple Vitamin (MULTIVITAMIN) tablet Take 1 tablet by mouth daily.    . natalizumab (TYSABRI) 300 MG/15ML injection Inject 300 mg into the vein every 28 (twenty-eight) days.    Marland Kitchen omeprazole (PRILOSEC) 40 MG capsule Take 40 mg by mouth 2 (two) times daily.    . sodium chloride (OCEAN) 0.65 % SOLN nasal spray Place 1 spray into both nostrils as needed for congestion.  0  . solifenacin (VESICARE) 10 MG tablet Take 10 mg by mouth daily.     Marland Kitchen zinc oxide 20 % ointment Apply 1 application topically 3 (three) times daily. Apply to bilateral buttocks every shift as a preventative measure    . baclofen (LIORESAL) 10 MG tablet TAKE 1 TABLET UP TO 6 TIMES DAILY 540 tablet 1  . FLUoxetine (PROZAC) 40 MG capsule Take 1 capsule (40 mg total) by mouth daily. 90 capsule 3  . OXcarbazepine (TRILEPTAL) 150 MG tablet Take 1 tablet (150 mg total) by mouth 3 (three) times daily. 270 tablet 0  . acetaminophen (TYLENOL) 500 MG tablet Take 500 mg by mouth every 6 (six) hours as needed for mild pain or moderate pain.    Marland Kitchen amitriptyline (ELAVIL) 10 MG tablet One or two at bedtime (Patient not taking: Reported on 10/31/2017) 180  tablet 3  . dronabinol (MARINOL) 2.5 MG capsule Take 1 capsule (2.5  mg total) by mouth 2 (two) times daily before a meal. (Patient not taking: Reported on 10/31/2017) 60 capsule 5  . lamoTRIgine (LAMICTAL) 200 MG tablet TAKE 1 TABLET (200 MG TOTAL) BY MOUTH 3 (THREE) TIMES DAILY. (Patient not taking: Reported on 10/31/2017) 270 tablet 3  . traMADol (ULTRAM) 50 MG tablet Take 1 tablet (50 mg total) by mouth every 8 (eight) hours as needed for up to 30 doses. (Patient not taking: Reported on 10/31/2017) 20 tablet 0   No facility-administered medications prior to visit.     PAST MEDICAL HISTORY: Past Medical History:  Diagnosis Date  . Headache   . History of kidney stones   . Hypertension   . Kidney stones   . Movement disorder   . Multiple sclerosis ([place])   . Neuropathy   . Ulcerative colitis ([place])   . Vision abnormalities     PAST SURGICAL HISTORY: Past Surgical History:  Procedure Laterality Date  . ENDOMETRIAL ABLATION  10/10/2017  . KIDNEY STONE SURGERY    . PORTACATH PLACEMENT N/A 10/16/2017   Procedure: ULTRA SOUND GUIDED INSERTION PORT-A-CATH ERAS PATHWAY;  Surgeon: Kieth Brightly Arta Bruce, MD;  Location: [facility];  Service: General;  Laterality: N/A;    FAMILY HISTORY: Family History  Problem Relation Age of Onset  . Dementia Mother   . Hypertension Father   . Hyperlipidemia Father   . Diabetes Father   . Heart disease Father   . Breast cancer Neg Hx     SOCIAL HISTORY:  Social History   Socioeconomic History  . Marital status: Married    Spouse name: Not on file  . Number of children: Not on file  . Years of education: Not on file  . Highest education level: Not on file  Social Needs  . Financial resource strain: Not on file  . Food insecurity - worry: Not on file  . Food insecurity - inability: Not on file  . Transportation needs - medical: Not on file  . Transportation needs - non-medical: Not on file  Occupational History  . Not on file  Tobacco Use  . Smoking status: Never Smoker  . Smokeless tobacco: Never Used    Substance and Sexual Activity  . Alcohol use: No  . Drug use: No  . Sexual activity: Not on file  Other Topics Concern  . Not on file  Social History Narrative  . Not on file     PHYSICAL EXAM  Vitals:   10/31/17 1444  BP: (!) 155/94  Pulse: 77  Resp: 16  Height: 5' 3"  (1.6 m)    Body mass index is 31 kg/m.   General: The patient is well-developed and well-nourished and in no acute distress  Skin: Extremities are without rash.        Neurologic Exam  Mental status: The patient is alert and oriented x 3 at the time of the examination.  She appears to have normal short and long-term memory and fairly normal focus and attention.  Speech is normal.  Cranial nerves: Extraocular movements are full.   Facial strength and sensation is normal. Trapezius strength is strong. There is no dysarthria.  No obvious hearing deficits are noted.  Motor:  Muscle bulk is normal . Tone is increased in the legs, right greater than left. Tone is also increase in the arms, slightly more on the right. Strength is/5 in the legs. Strength is 2  minus/5 in the left arm and 2+/5-3/5 in the right arm. Tone is increased in the legs, right greater than left. Tone is also increased in the arms, a little more left than right.   Strength is  0/5 in legs, in arms strength is 2 /5 in left arm and 2+ to 3 /5 in right arm  with extension > flexion weakness.    Sensory: Sensory testing is intact to pinprick, soft touch in arms and legs.  She has weak reduced vibration sensation in the legs, worse on the left.  Coordination: Cerebellar testing is difficult to test due to weakness.     Gait and station: She can not stand or walk.    Reflexes: Deep tendon reflexes show spread at the knees.  No ankle clonus.      DIAGNOSTIC DATA (LABS, IMAGING, TESTING) - I reviewed patient records, labs, notes, testing and imaging myself where available.  Lab Results  Component Value Date   WBC 7.2 07/03/2017   HGB 11.5  07/03/2017   HCT 36.6 07/03/2017   MCV 98 (H) 07/03/2017   PLT 245 07/03/2017      Component Value Date/Time   NA 143 07/03/2017 1615   K 3.3 (L) 07/03/2017 1615   CL 100 07/03/2017 1615   CO2 25 07/03/2017 1615   GLUCOSE 89 07/03/2017 1615   GLUCOSE 121 (H) 10/11/2016 0932   BUN 15 07/03/2017 1615   CREATININE 0.96 07/03/2017 1615   CALCIUM 9.5 07/03/2017 1615   PROT 7.0 07/03/2017 1615   ALBUMIN 4.5 07/03/2017 1615   AST 17 07/03/2017 1615   ALT 12 07/03/2017 1615   ALKPHOS 94 07/03/2017 1615   BILITOT 0.3 07/03/2017 1615   GFRNONAA 68 07/03/2017 1615   GFRAA 79 07/03/2017 1615       ASSESSMENT AND PLAN  Multiple sclerosis (HCC)  Spastic quadriparesis (HCC)  Wheelchair bound  High risk medication use  Visual hallucination  Other fatigue  Dysesthesia  Depression, unspecified depression type   1.   Tysabri 300 mg every 4 weeks.  We will recheck the JCV antibody in 3-4 months.  She is low titer positive implying a 1: 2000 risk of PML after 2 or more years.   2.    Continue oxcarbazepine for dysesthesias.  Continue baclofen for spasticity.   3  She will return for follow-up in 4 months. Call sooner if  new or worsening neurologic symptoms she should call us sooner. buspar anxiety    Richard A. Felecia Shelling, MD, PhD 7/82/9562, 1:30 PM Certified in Neurology, Clinical Neurophysiology, Sleep Medicine, Pain Medicine and Neuroimaging  [facility] [address] [place], [place] [address] [phone]

## 2017-12-25 ENCOUNTER — Other Ambulatory Visit: Payer: Self-pay | Admitting: Neurology

## 2017-12-25 MED ORDER — LIDOCAINE-PRILOCAINE 2.5-2.5 % EX CREA: TOPICAL_CREAM | CUTANEOUS | 3 refills | Status: DC

## 2018-03-06 ENCOUNTER — Encounter: Payer: Self-pay | Admitting: Neurology

## 2018-03-06 ENCOUNTER — Other Ambulatory Visit: Payer: Self-pay

## 2018-03-06 ENCOUNTER — Ambulatory Visit (INDEPENDENT_AMBULATORY_CARE_PROVIDER_SITE_OTHER): Payer: Commercial Managed Care - PPO | Admitting: Neurology

## 2018-03-06 VITALS — BP 131/78 | HR 66 | Resp 18

## 2018-03-06 DIAGNOSIS — Z993 Dependence on wheelchair: Secondary | ICD-10-CM

## 2018-03-06 DIAGNOSIS — G8250 Quadriplegia, unspecified: Secondary | ICD-10-CM | POA: Diagnosis not present

## 2018-03-06 DIAGNOSIS — G35 Multiple sclerosis: Secondary | ICD-10-CM

## 2018-03-06 DIAGNOSIS — R5383 Other fatigue: Secondary | ICD-10-CM

## 2018-03-06 DIAGNOSIS — R208 Other disturbances of skin sensation: Secondary | ICD-10-CM

## 2018-03-06 DIAGNOSIS — R441 Visual hallucinations: Secondary | ICD-10-CM

## 2018-03-06 DIAGNOSIS — Z79899 Other long term (current) drug therapy: Secondary | ICD-10-CM

## 2018-03-06 MED ORDER — BACLOFEN 20 MG PO TABS
ORAL_TABLET | ORAL | 4 refills | Status: DC
Start: 2018-03-06 — End: 2019-05-06

## 2018-03-06 NOTE — Progress Notes (Signed)
[facility]  [name] DOB: 12-26-1965  REFERRING CLINICIAN: Adah Salvage, [facility] HISTORY FROM: patient  REASON FOR VISIT: MS   HISTORICAL  CHIEF COMPLAINT:  Chief Complaint  Patient presents with  . Multiple Sclerosis    Sts. she continues to tolerate Tysabri well.  JCV ab last checked 07/03/17 was Indeterminate at 0.35. Assay was Positive.  Sts. is currently taking Baclofen 110m 1 and 1/2 tabs tid and 2 tabs qhs/fim    HISTORY OF PRESENT ILLNESS:  Vanessa Short a 52year old woman diagnosed with MS in 1989.   Update 03/06/2018: She continues on Tysabri and she tolerates it well.  She feels the MS has been stable.  She has had no exacerbations.  When last checked, she had a low positive JCV antibody implying a 1: 2000 risk of PML after 2 years.  She has severe weakness in her legs associated with spasticity and is in a wheelchair.  She takes baclofen (60-70 mg daily) and dantrolene 50 mg tid to help with some of the phasic spasm episodes.    She has weakness in her hands and feels strength has improved in her hands on Tysabri.   She also can feed herself better.   She has mild dysphagia but voice is normal.   She has had dysesthesias and they are helped by Trileptal.   Her left vision is reduced and she will sometimes have diplopia looking left.  She feels her bladder function is doing well on Vesicare.  She has constipation helped by Amitiza.      Fatigue has done better on Tysabri.  She usually falls asleep easily but will have sleep maintenance insomnia.  She denies any more issues with hallucinations.  Update 10/31/2017: She feels her MS is stable. .  She has had 2 Tysabri infusions with the first 1 in February and the last one yesterday.  JCV antibody is indeterminate at 0.35 but the inhibition assay was positive.  We have discussed that there is a small risk of PML that could increase the longer she is on the medication.   She does feel that her fatigue has done better since starting Tysabri and she feels that her hands are doing better as well.    She has severe weakness in the legs with spasticity. Spasticity is worse when anxious.    She is taking baclofen 5-6 pills (10 mg each) daily. She took Marinol 2.5 mg but had excessive lethargy and quit.   That has helped her some.  Dysesthesias are much better and she is only on Trileptal now (stopped lamotrigine and amitriptyline).    Bladder function is doing well on Vesicare.  Amitiza helps constipation.   She denies any major problems with her vision but will sometimes have diplopia when she looks to the left.  She has had issues with fatigue though it is better on the Tysabri.  Most nights, she will fall asleep well but will often wake up.  This is worse since her GYN surgery and going through menopause.  She has had some depression that is fairly stable.  In the past she was having visual hallucinations sometimes seeing her husband when he is not around.      Update 07/03/2017:    Her MS is doing about the same.   She does not note any definite new symptoms or exacerbation but she does feel that her arm strength, much worse with her pneumonia and has not returned to  baseline. Spasticity is still a problem in the legs.     For spasticity,she is on baclofen 10 mg po 5-6 pills a day.      She was taking Marinol 2.5 mg bid but felt a little out of it and backed off to just once a day which is more beneficial for her.   Bladder is doing ok on Vesicare.   When she looks to the far left she notes diplopia and she shuts her left eye to avoid.    She notes some fatigue but this is about the same as at the last visit. She is sleeping well most nights but will wake up more if her husbands snoring is loud.    She has depression. He feels this is stable patient does well. She is not having as many hallucinations.  From 02/27/2017: MS:   She is on Aubagio,  she tolerates it well. She has  noted some worsening while on the medication clinically. In the past, we have discussed ocrelizumab.  Gait/Strength/sensation:  Legs are doing the same with severe weakness and spasticity.  Dantrolene has not helped much for spasticity.   She takes baclofen 4.5 pills a day and we discussed increasing this and decreasing the dantrolene.   Spasms often occur when she lays down in bed.   She has dysesthetic nerve pain in her legs, helped by amitriptyline and lamotrigine.     In the past, we tried Botox injections into the left arm but  Botox did not improve functioning.   Writing has become more difficulty. She also has had more problems controlling her wheelchair and has had control on her current wheelchair.    Sensation/pain:  She has right anterior shoulder region dysesthesia but this is milder than last year.  Gabapentin was not tolerated.    Lamotrigine 150 mg po bid with amitriptyline helped with dysesthesia at first but less now.   We just went up on the lamotrigine.    Vision:   Vision is about the same.  She has mild decreased acuity and occasional diplopia.        Bladder/Bowel:  She has bladder dysfunction with some benefit form Vesicare.   She more incontinence and always uses Depends.  She is on Linzess due to many of her med's making constipation worse.  She also has ulcerative colitis and requires frequent colonoscopies  Fatigue/sleep:   She has daily fatigue .   She has some insomnia at times.     She has nocturia at night and sleep is variable.    Hallucinations:  Prior to the hospitalization, she would get occasional mild visual hallucinations. These are occurring more. When she was at her worse, the last week of December in the first couple days in January, she had more hallucinations and also had severe confusion.    Mood/Cognition     she notes a mild depression. She has some increased stress with some family issues such as being the executor of her father's will and  having a  teenage daughter. There is mild anxiety. Earlier this year, in association with a severe infection requiring hospitalization, she had cognitive issues. Back to baseline and currently she is doing well with mild reduced attention..  MS History:  She presented with a Lhermite's syndrome in 1988 or 1989.   MRIs of the head and neck were performed. They showed lesions consistent with the diagnosis of multiple sclerosis. Lumbar puncture was not necessary. Over the next 15 years, she  would get some exacerbations and have courses of steroids. However, a disease modifying therapy was not prescribed. By 2004, she had difficulty with her gait and would often have to lean on somebody for support.  She started Copaxone that year and stayed on it for about one half years. She stopped due to a lot of itching. She then switched to Rebif. While on Copaxone Rebif she continues to have exacerbations and further difficulties with her gait. About 2012 she switched to [facility] and has been on that medicine since. She tolerates Aubagio well but is not sure how well it is working for her.   She was off Aubagio x 2-3 weeks in 2013 and felt she did worse.  Last MRI's about 3 years ago.    REVIEW OF SYSTEMS:  Constitutional: No fevers, chills, sweats, or change in appetite.  Has fatigue and poor slep Eyes: see above.   No eye pain Ear, nose and throat: No hearing loss, ear pain, nasal congestion, sore throat Cardiovascular: No chest pain, palpitations Respiratory:  No shortness of breath at rest or with exertion.   No wheezes GastrointestinaI: Has UC - doing well.   No nausea, vomiting, diarrhea, abdominal pain, fecal incontinence Genitourinary:  see above Musculoskeletal:  No neck pain, back pain Integumentary: No rash, pruritus, skin lesions Neurological: as above Psychiatric: as above. Endocrine: No palpitations, diaphoresis, change in appetite, change in weigh or increased thirst Hematologic/Lymphatic:  No anemia,  purpura, petechiae. Allergic/Immunologic: No itchy/runny eyes, nasal congestion, recent allergic reactions, rashes  ALLERGIES: Allergies  Allergen Reactions  . Oxycodone-Acetaminophen Other (See Comments)    Other  . Penicillins Rash    Has patient had a PCN reaction causing immediate rash, facial/tongue/throat swelling, SOB or lightheadedness with hypotension: NO Has patient had a PCN reaction causing severe rash involving mucus membranes or skin necrosis: NO Has patient had a PCN reaction that required hospitalization NO Has patient had a PCN reaction occurring within the last 10 years:NO If all of the above answers are "NO", then may proceed with Cephalosporin use.    HOME MEDICATIONS: Outpatient Medications Prior to Visit  Medication Sig Dispense Refill  . acyclovir (ZOVIRAX) 400 MG tablet Take 400 mg by mouth daily. Take every day per patient  0  . aspirin EC 81 MG tablet Take 81 mg by mouth daily.    Marland Kitchen b complex vitamins tablet Take 1 tablet by mouth daily.     . cholecalciferol (VITAMIN D) 1000 UNITS tablet Take 1,000 Units by mouth daily.     . clobetasol cream (TEMOVATE) 0.05 % APPLY TO NAILS DAILY  2  . dantrolene (DANTRIUM) 50 MG capsule TAKE 1 CAPSULE (50 MG TOTAL) BY MOUTH 3 (THREE) TIMES DAILY. 270 capsule 3  . FLUoxetine (PROZAC) 40 MG capsule Take 1 capsule (40 mg total) by mouth daily. 90 capsule 3  . folic acid (FOLVITE) 366 MCG tablet Take 400 mcg by mouth daily.     . furosemide (LASIX) 40 MG tablet Take 1 tablet (40 mg total) by mouth 2 (two) times daily. 30 tablet   . hydrocortisone cream 1 % Apply 1 application topically 2 (two) times daily as needed for itching.    Marland Kitchen ibuprofen (ADVIL,MOTRIN) 200 MG tablet Take 200 mg by mouth 3 (three) times daily.     Marland Kitchen labetalol (NORMODYNE) 100 MG tablet Take 100 mg by mouth daily.    Marland Kitchen lidocaine (XYLOCAINE) 2 % solution Lidocaine Viscous 2 % mucosal solution    . lidocaine-prilocaine (  EMLA) cream Apply one inch before port-a  cath access prn 30 g 3  . mesalamine (PENTASA) 500 MG CR capsule Take 1,000 mg by mouth 3 (three) times daily.     . Multiple Vitamin (MULTIVITAMIN) tablet Take 1 tablet by mouth daily.    . natalizumab (TYSABRI) 300 MG/15ML injection Inject 300 mg into the vein every 28 (twenty-eight) days.    Marland Kitchen omeprazole (PRILOSEC) 40 MG capsule Take 40 mg by mouth 2 (two) times daily.    . OXcarbazepine (TRILEPTAL) 150 MG tablet Take 1 tablet (150 mg total) by mouth 3 (three) times daily. 270 tablet 3  . sodium chloride (OCEAN) 0.65 % SOLN nasal spray Place 1 spray into both nostrils as needed for congestion.  0  . solifenacin (VESICARE) 10 MG tablet Take 10 mg by mouth daily.     Marland Kitchen zinc oxide 20 % ointment Apply 1 application topically 3 (three) times daily. Apply to bilateral buttocks every shift as a preventative measure    . baclofen (LIORESAL) 10 MG tablet TAKE 1 TABLET UP TO 6 TIMES DAILY 540 tablet 3  . acetaminophen (TYLENOL) 500 MG tablet Take 500 mg by mouth every 6 (six) hours as needed for mild pain or moderate pain.     No facility-administered medications prior to visit.     PAST MEDICAL HISTORY: Past Medical History:  Diagnosis Date  . Headache   . History of kidney stones   . Hypertension   . Kidney stones   . Movement disorder   . Multiple sclerosis ([place])   . Neuropathy   . Ulcerative colitis ([place])   . Vision abnormalities     PAST SURGICAL HISTORY: Past Surgical History:  Procedure Laterality Date  . ENDOMETRIAL ABLATION  10/10/2017  . KIDNEY STONE SURGERY    . PORTACATH PLACEMENT N/A 10/16/2017   Procedure: ULTRA SOUND GUIDED INSERTION PORT-A-CATH ERAS PATHWAY;  Surgeon: Kieth Brightly Arta Bruce, MD;  Location: [facility];  Service: General;  Laterality: N/A;    FAMILY HISTORY: Family History  Problem Relation Age of Onset  . Dementia Mother   . Hypertension Father   . Hyperlipidemia Father   . Diabetes Father   . Heart disease Father   . Breast cancer Neg Hx     SOCIAL  HISTORY:  Social History   Socioeconomic History  . Marital status: Married    Spouse name: Not on file  . Number of children: Not on file  . Years of education: Not on file  . Highest education level: Not on file  Occupational History  . Not on file  Social Needs  . Financial resource strain: Not on file  . Food insecurity:    Worry: Not on file    Inability: Not on file  . Transportation needs:    Medical: Not on file    Non-medical: Not on file  Tobacco Use  . Smoking status: Never Smoker  . Smokeless tobacco: Never Used  Substance and Sexual Activity  . Alcohol use: No  . Drug use: No  . Sexual activity: Not on file  Lifestyle  . Physical activity:    Days per week: Not on file    Minutes per session: Not on file  . Stress: Not on file  Relationships  . Social connections:    Talks on phone: Not on file    Gets together: Not on file    Attends religious service: Not on file    Active member of club or organization:  Not on file    Attends meetings of clubs or organizations: Not on file    Relationship status: Not on file  . Intimate partner violence:    Fear of current or ex partner: Not on file    Emotionally abused: Not on file    Physically abused: Not on file    Forced sexual activity: Not on file  Other Topics Concern  . Not on file  Social History Narrative  . Not on file     PHYSICAL EXAM  Vitals:   03/06/18 1300  BP: 131/78  Pulse: 66  Resp: 18    There is no height or weight on file to calculate BMI.   General: The patient is well-developed and well-nourished and in no acute distress  Skin: Extremities are without rash.  Changes in and around 5th digit nail on the right.       Neurologic Exam  Mental status: The patient is alert and oriented x 3 at the time of the examination.  She appears to have normal short and long-term memory and fairly normal focus and attention.  Speech is normal.  Cranial nerves: Extraocular movements are  full.   Facial strength and sensation is normal.  Trapezius strength is normal.  There is no dysarthria.  No obvious hearing deficits are noted.  Motor:  Muscle bulk is normal . Tone is increased in the legs, right greater than left. Tone is also increase in the arms, slightly more on the right. Strength is/5 in the legs. Strength is 2 minus/5 in the left arm and 2+/5-3/5 in the right arm. Tone is increased in the legs, right greater than left. Tone is also increased in the arms, a little more left than right.   Strength is  0/5 in legs, in arms strength is 2 /5 in left arm and 2+ to 3 /5 in right arm  with extension > flexion weakness.    Sensory: Sensory testing is intact to pinprick, soft touch in arms and legs.  Vibration sensation is mildly reduced in the legs.  Coordination: Cerebellar testing is difficult to test due to weakness.     Gait and station: She can not stand or walk.    Reflexes: She has increased reflexes in the legs with crossed adductor responses.  No ankle clonus.      DIAGNOSTIC DATA (LABS, IMAGING, TESTING) - I reviewed patient records, labs, notes, testing and imaging myself where available.  Lab Results  Component Value Date   WBC 7.2 07/03/2017   HGB 11.5 07/03/2017   HCT 36.6 07/03/2017   MCV 98 (H) 07/03/2017   PLT 245 07/03/2017      Component Value Date/Time   NA 143 07/03/2017 1615   K 3.3 (L) 07/03/2017 1615   CL 100 07/03/2017 1615   CO2 25 07/03/2017 1615   GLUCOSE 89 07/03/2017 1615   GLUCOSE 121 (H) 10/11/2016 0932   BUN 15 07/03/2017 1615   CREATININE 0.96 07/03/2017 1615   CALCIUM 9.5 07/03/2017 1615   PROT 7.0 07/03/2017 1615   ALBUMIN 4.5 07/03/2017 1615   AST 17 07/03/2017 1615   ALT 12 07/03/2017 1615   ALKPHOS 94 07/03/2017 1615   BILITOT 0.3 07/03/2017 1615   GFRNONAA 68 07/03/2017 1615   GFRAA 79 07/03/2017 1615       ASSESSMENT AND PLAN  Multiple sclerosis (HCC) - Plan: Stratify JCV Antibody Test ([facility]), CBC with  Differential/Platelet  Spastic quadriparesis (HCC)  Other fatigue  Dysesthesia  Visual hallucination  High risk medication use - Plan: Stratify JCV Antibody Test ([facility]), CBC with Differential/Platelet  Wheelchair bound   1.   Tysabri 300 mg every 4 weeks.  We will recheck the JCV antibody in 3-4 months.  She is low titer positive implying a 1: 2000 risk of PML after 2 or more years.     Check labs 2.   Dysesthesias are doing well and she would like to try to go off the oxarbazepine.   She will slow;ly taper it off over a few weeks.   3.   Baclofen change to 20 mg qid.   4.   Advised to discuss nail changes with PCP or dermatology She will return for follow-up in 6 months. Call sooner if  new or worsening neurologic symptoms   40 minute face to face interaction with > 1/2 the time counseling and coordinating care related to her MS and MS related issues.        Latyra Jaye A. Felecia Shelling, MD, PhD 9/67/8938, 1:01 PM Certified in Neurology, Clinical Neurophysiology, Sleep Medicine, Pain Medicine and Neuroimaging  [facility] [address] [place], [place] [address] [phone]

## 2018-03-12 ENCOUNTER — Telehealth: Payer: Self-pay | Admitting: *Deleted

## 2018-03-12 NOTE — Telephone Encounter (Signed)
Faxed signed order back to [employer] re: PWC modifications. Included recent office note. Fax: [phone]. Received fax confirmation.

## 2018-03-19 ENCOUNTER — Encounter: Payer: Self-pay | Admitting: Neurology

## 2018-03-26 ENCOUNTER — Encounter: Payer: Self-pay | Admitting: *Deleted

## 2018-04-08 ENCOUNTER — Telehealth: Payer: Self-pay | Admitting: Neurology

## 2018-04-08 NOTE — Telephone Encounter (Signed)
Spoke with Tiffany.  Her JCV ab went from 0.35 (assay positive) to 0.74.  She is trying to decide if she should stay on Tysabri/fim

## 2018-04-08 NOTE — Telephone Encounter (Signed)
LMOM for Vanessa Short.  Dr. Felecia Shelling feels it would be prudent for her to have Tysabri infusions every 6 wks instead of every 4; if JCV ab titer continues to rise, then will discuss changing dmt's/fim

## 2018-04-08 NOTE — Telephone Encounter (Signed)
Pt request lab results. She also is having trouble with staying warm and keeping cool. Please call to advise

## 2018-05-06 ENCOUNTER — Other Ambulatory Visit: Payer: Self-pay | Admitting: Family Medicine

## 2018-05-06 DIAGNOSIS — Z1231 Encounter for screening mammogram for malignant neoplasm of breast: Secondary | ICD-10-CM

## 2018-06-03 ENCOUNTER — Ambulatory Visit
Admission: RE | Admit: 2018-06-03 | Discharge: 2018-06-03 | Disposition: A | Discharge status: Home / Self Care | Payer: Commercial Managed Care - PPO | Source: Ambulatory Visit | Attending: Family Medicine | Admitting: Family Medicine

## 2018-06-03 DIAGNOSIS — Z1231 Encounter for screening mammogram for malignant neoplasm of breast: Secondary | ICD-10-CM

## 2018-06-23 ENCOUNTER — Emergency Department (HOSPITAL_COMMUNITY)
Admission: EM | Admit: 2018-06-23 | Discharge: 2018-06-23 | Disposition: A | Discharge status: Home / Self Care | Payer: Commercial Managed Care - PPO | Attending: Emergency Medicine | Admitting: Emergency Medicine

## 2018-06-23 ENCOUNTER — Encounter (HOSPITAL_COMMUNITY): Payer: Self-pay | Admitting: Emergency Medicine

## 2018-06-23 ENCOUNTER — Emergency Department (HOSPITAL_COMMUNITY): Payer: Commercial Managed Care - PPO

## 2018-06-23 ENCOUNTER — Other Ambulatory Visit: Payer: Self-pay

## 2018-06-23 DIAGNOSIS — I5032 Chronic diastolic (congestive) heart failure: Secondary | ICD-10-CM | POA: Diagnosis not present

## 2018-06-23 DIAGNOSIS — I110 Hypertensive heart disease with heart failure: Secondary | ICD-10-CM | POA: Diagnosis not present

## 2018-06-23 DIAGNOSIS — E876 Hypokalemia: Secondary | ICD-10-CM | POA: Diagnosis not present

## 2018-06-23 DIAGNOSIS — D649 Anemia, unspecified: Secondary | ICD-10-CM | POA: Diagnosis not present

## 2018-06-23 DIAGNOSIS — Z7982 Long term (current) use of aspirin: Secondary | ICD-10-CM | POA: Diagnosis not present

## 2018-06-23 DIAGNOSIS — Z79899 Other long term (current) drug therapy: Secondary | ICD-10-CM | POA: Insufficient documentation

## 2018-06-23 DIAGNOSIS — R443 Hallucinations, unspecified: Secondary | ICD-10-CM

## 2018-06-23 LAB — BASIC METABOLIC PANEL
Anion gap: 10 (ref 5–15)
BUN: 21 mg/dL — AB (ref 6–20)
CALCIUM: 9.3 mg/dL (ref 8.9–10.3)
CHLORIDE: 104 mmol/L (ref 98–111)
CO2: 27 mmol/L (ref 22–32)
CREATININE: 0.86 mg/dL (ref 0.44–1.00)
Glucose, Bld: 112 mg/dL — ABNORMAL HIGH (ref 70–99)
POTASSIUM: 3.4 mmol/L — AB (ref 3.5–5.1)
SODIUM: 141 mmol/L (ref 135–145)

## 2018-06-23 LAB — CBC WITH DIFFERENTIAL/PLATELET
Abs Immature Granulocytes: 0.37 10*3/uL — ABNORMAL HIGH (ref 0.00–0.07)
BASOS PCT: 1 %
Basophils Absolute: 0.1 10*3/uL (ref 0.0–0.1)
EOS ABS: 0.1 10*3/uL (ref 0.0–0.5)
EOS PCT: 1 %
HCT: 36.1 % (ref 36.0–46.0)
Hemoglobin: 11.4 g/dL — ABNORMAL LOW (ref 12.0–15.0)
Immature Granulocytes: 4 %
Lymphocytes Relative: 16 %
Lymphs Abs: 1.6 10*3/uL (ref 0.7–4.0)
MCH: 31.1 pg (ref 26.0–34.0)
MCHC: 31.6 g/dL (ref 30.0–36.0)
MCV: 98.4 fL (ref 80.0–100.0)
MONO ABS: 1 10*3/uL (ref 0.1–1.0)
Monocytes Relative: 10 %
Neutro Abs: 7 10*3/uL (ref 1.7–7.7)
Neutrophils Relative %: 68 %
PLATELETS: 208 10*3/uL (ref 150–400)
RBC: 3.67 MIL/uL — AB (ref 3.87–5.11)
RDW: 16.8 % — ABNORMAL HIGH (ref 11.5–15.5)
WBC: 10.1 10*3/uL (ref 4.0–10.5)
nRBC: 1.1 % — ABNORMAL HIGH (ref 0.0–0.2)

## 2018-06-23 LAB — URINALYSIS, ROUTINE W REFLEX MICROSCOPIC
BILIRUBIN URINE: NEGATIVE
GLUCOSE, UA: NEGATIVE mg/dL
HGB URINE DIPSTICK: NEGATIVE
Ketones, ur: NEGATIVE mg/dL
Leukocytes, UA: NEGATIVE
Nitrite: NEGATIVE
PROTEIN: NEGATIVE mg/dL
Specific Gravity, Urine: 1.015 (ref 1.005–1.030)
pH: 5 (ref 5.0–8.0)

## 2018-06-23 MED ORDER — POTASSIUM CHLORIDE CRYS ER 20 MEQ PO TBCR
40.0000 meq | EXTENDED_RELEASE_TABLET | Freq: Once | ORAL | Status: AC
  Administered 2018-06-23: 40 meq via ORAL
  Filled 2018-06-23: qty 2

## 2018-06-23 MED ORDER — HEPARIN SOD (PORK) LOCK FLUSH 100 UNIT/ML IV SOLN
500.0000 [IU] | Freq: Once | INTRAVENOUS | Status: AC
  Administered 2018-06-23: 500 [IU]
  Filled 2018-06-23: qty 5

## 2018-06-23 NOTE — ED Triage Notes (Signed)
Per EMS: Pt from home with c/o lethargy and hallucinations.  Caregiver reports AMS from baseline.  At baseline pt is A/O x4.  On arrival today A/O x3.  Pt has MS and is non-ambulatory.

## 2018-06-23 NOTE — ED Notes (Signed)
Patient transported to X-ray 

## 2018-06-23 NOTE — ED Notes (Signed)
Pt unable to sign full name on signature pad due to [place].

## 2018-06-23 NOTE — Discharge Instructions (Addendum)
Discontinue prednisone.  Continue azithromycin as prescribed.  Call your neurologist to schedule an appointment regarding your recent hallucinations.  Return if concern for any reason

## 2018-06-23 NOTE — ED Provider Notes (Signed)
[facility] EMERGENCY DEPARTMENT Provider Note   CSN: [id number] Arrival date & time: 06/23/18  1123     History   Chief Complaint Chief Complaint  Patient presents with  . Lethargic    HPI Vanessa Short is a 52 y.o. female.  HPI History is obtained from patient and from her aide Vanessa Short who accompanies her. She has had a "cold" with cough and sneeze for approximately the past week.  Within the past week she has began to hallucinate, seeing dogs that were not there and she was repeating sentences over and over again yesterday.  This morning her mental status is normal per Vanessa Short.  Patient is asymptomatic and states "I feel fine" she went to [facility] yesterday was diagnosed with pneumonia, treated with azithromycin and prednisone taper.  She is taking the first dose.  She has had no fever.  No other associated symptoms.  Denies shortness of breath. Past Medical History:  Diagnosis Date  . Headache   . History of kidney stones   . Hypertension   . Kidney stones   . Movement disorder   . Multiple sclerosis ([place])   . Neuropathy   . Ulcerative colitis ([place])   . Vision abnormalities     Patient Active Problem List   Diagnosis Date Noted  . Wheelchair bound 02/27/2017  . Chronic diastolic CHF (congestive heart failure) ([place]) 09/30/2016  . Prolonged QT interval   . SIRS (systemic inflammatory response syndrome) ([employer]) 09/29/2016  . HCAP (healthcare-associated pneumonia) 09/29/2016  . High risk medication use 09/27/2016  . Acute encephalopathy 08/22/2016  . MDD (major depressive disorder), recurrent severe, without psychosis ([place]) 08/16/2016  . Altered mental state   . Acute respiratory failure with hypoxia ([place]) 08/07/2016  . CAP (community acquired pneumonia) 08/07/2016  . Left lower lobe pneumonia ([place]) 08/05/2016  . Hypokalemia 08/05/2016  . Normocytic anemia 08/05/2016  . Slurred speech 08/05/2016  . Ulcerative colitis  ([place]) 08/05/2016  . Essential hypertension 08/05/2016  . Subacromial bursitis 05/09/2016  . Visual hallucination 03/14/2016  . Other fatigue 11/09/2015  . Urinary disorder 11/09/2015  . Ear ache 10/13/2015  . Sore throat 10/13/2015  . Hand weakness 01/06/2015  . Multiple sclerosis ([place]) 09/07/2014  . 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 Dysesthesia 09/07/2014  . Depression 09/07/2014    Past Surgical History:  Procedure Laterality Date  . ENDOMETRIAL ABLATION  10/10/2017  . KIDNEY STONE SURGERY    . PORTACATH PLACEMENT N/A 10/16/2017   Procedure: ULTRA SOUND GUIDED INSERTION PORT-A-CATH ERAS PATHWAY;  Surgeon: Kieth Brightly Arta Bruce, MD;  Location: [facility] ORS;  Service: General;  Laterality: N/A;     OB History   None      Home Medications    Prior to Admission medications   Medication Sig Start Date End Date Taking? Authorizing Provider  acyclovir (ZOVIRAX) 400 MG tablet Take 400 mg by mouth daily. Take every day per patient 08/11/14   [provider]  aspirin EC 81 MG tablet Take 81 mg by mouth daily.    [provider]  b complex vitamins tablet Take 1 tablet by mouth daily.     [provider]  baclofen (LIORESAL) 20 MG tablet TAKE 1 TABLET UP TO 4 TIMES DAILY 03/06/18   Sater, Nanine Means, MD  cholecalciferol (VITAMIN D) 1000 UNITS tablet Take 1,000 Units by mouth daily.     [provider]  clobetasol cream (TEMOVATE) 0.05 % APPLY TO NAILS DAILY 02/06/18  [provider]  dantrolene (DANTRIUM) 50 MG capsule TAKE 1 CAPSULE (50 MG TOTAL) BY MOUTH 3 (THREE) TIMES DAILY. 10/22/17   Sater, Nanine Means, MD  FLUoxetine (PROZAC) 40 MG capsule Take 1 capsule (40 mg total) by mouth daily. 10/31/17   Sater, Nanine Means, MD  folic acid (FOLVITE) 818 MCG tablet Take 400 mcg by mouth daily.     [provider]  furosemide (LASIX) 40 MG tablet Take 1 tablet (40 mg total) by mouth 2 (two) times daily. 08/19/16   Rai, Vernelle Emerald, MD    hydrocortisone cream 1 % Apply 1 application topically 2 (two) times daily as needed for itching.    [provider]  ibuprofen (ADVIL,MOTRIN) 200 MG tablet Take 200 mg by mouth 3 (three) times daily.     [provider]  labetalol (NORMODYNE) 100 MG tablet Take 100 mg by mouth daily.    [provider]  lidocaine (XYLOCAINE) 2 % solution Lidocaine Viscous 2 % mucosal solution    [provider]  lidocaine-prilocaine (EMLA) cream Apply one inch before port-a cath access prn 12/25/17   Sater, Nanine Means, MD  mesalamine (PENTASA) 500 MG CR capsule Take 1,000 mg by mouth 3 (three) times daily.     [provider]  Multiple Vitamin (MULTIVITAMIN) tablet Take 1 tablet by mouth daily.    [provider]  natalizumab (TYSABRI) 300 MG/15ML injection Inject 300 mg into the vein every 28 (twenty-eight) days.    [provider]  omeprazole (PRILOSEC) 40 MG capsule Take 40 mg by mouth 2 (two) times daily.    [provider]  OXcarbazepine (TRILEPTAL) 150 MG tablet Take 1 tablet (150 mg total) by mouth 3 (three) times daily. 10/31/17   Sater, Nanine Means, MD  sodium chloride (OCEAN) 0.65 % SOLN nasal spray Place 1 spray into both nostrils as needed for congestion. 08/19/16   Rai, Ripudeep Raliegh Ip, MD  solifenacin (VESICARE) 10 MG tablet Take 10 mg by mouth daily.     [provider]  zinc oxide 20 % ointment Apply 1 application topically 3 (three) times daily. Apply to bilateral buttocks every shift as a preventative measure    [provider]    Family History Family History  Problem Relation Age of Onset  . Dementia Mother   . Hypertension Father   . Hyperlipidemia Father   . Diabetes Father   . Heart disease Father   . Breast cancer Neg Hx     Social History Social History   Tobacco Use  . Smoking status: Never Smoker  . Smokeless tobacco: Never Used  Substance Use Topics  . Alcohol use: No  . Drug use: No      Allergies   Oxycodone-acetaminophen and Penicillins   Review of Systems Review of Systems  HENT: Positive for sneezing.   Respiratory: Positive for cough.   Musculoskeletal:       Wheelchair-bound,   Neurological:       Quadriplegic  Psychiatric/Behavioral: Positive for hallucinations.  All other systems reviewed and are negative.    Physical Exam Updated Vital Signs BP (!) 143/75 (BP Location: Right Arm)   Pulse 72   Temp 98.4 F (36.9 C) (Oral)   Resp 19   Ht 5' 3"  (1.6 m)   SpO2 98%   BMI 31.00 kg/m   Physical Exam  Constitutional: She is oriented to person, place, and time. She appears well-developed and well-nourished. No distress.  HENT:  Head: Normocephalic and  atraumatic.  Eyes: Pupils are equal, round, and reactive to light. Conjunctivae are normal.  Neck: Neck supple. No tracheal deviation present. No thyromegaly present.  Cardiovascular: Normal rate and regular rhythm.  No murmur heard. Pulmonary/Chest: Effort normal and breath sounds normal.  Abdominal: Soft. Bowel sounds are normal. She exhibits no distension. There is no tenderness.  Musculoskeletal: Normal range of motion. She exhibits no edema or tenderness.  Flexion contractures of both upper extremities.  Atrophy of all 4 extremities  Neurological: She is alert and oriented to person, place, and time. Coordination normal.  Chest questions appropriately.  Nerves II through XII grossly intact.  Glasgow Coma Score 15.  Follows simple commands.  Motor strength of bilateral upper extremities 1/5 motor strength of bilateral lower extremities 0/5  Skin: Skin is warm and dry. No rash noted.  Psychiatric: She has a normal mood and affect.  Nursing note and vitals reviewed.    ED Treatments / Results  Labs (all labs ordered are listed, but only abnormal results are displayed) Labs Reviewed  BASIC METABOLIC PANEL  CBC WITH DIFFERENTIAL/PLATELET  URINALYSIS, ROUTINE W REFLEX MICROSCOPIC     EKG None  Radiology No results found.  Procedures Procedures (including critical care time)  Medications Ordered in ED Medications - No data to display  Results for orders placed or performed during the hospital encounter of 10/93/23  Basic metabolic panel  Result Value Ref Range   Sodium 141 135 - 145 mmol/L   Potassium 3.4 (L) 3.5 - 5.1 mmol/L   Chloride 104 98 - 111 mmol/L   CO2 27 22 - 32 mmol/L   Glucose, Bld 112 (H) 70 - 99 mg/dL   BUN 21 (H) 6 - 20 mg/dL   Creatinine, Ser 0.86 0.44 - 1.00 mg/dL   Calcium 9.3 8.9 - 10.3 mg/dL   GFR calc non Af Amer >60 >60 mL/min   GFR calc Af Amer >60 >60 mL/min   Anion gap 10 5 - 15  CBC with Differential/Platelet  Result Value Ref Range   WBC 10.1 4.0 - 10.5 K/uL   RBC 3.67 (L) 3.87 - 5.11 MIL/uL   Hemoglobin 11.4 (L) 12.0 - 15.0 g/dL   HCT 36.1 36.0 - 46.0 %   MCV 98.4 80.0 - 100.0 fL   MCH 31.1 26.0 - 34.0 pg   MCHC 31.6 30.0 - 36.0 g/dL   RDW 16.8 (H) 11.5 - 15.5 %   Platelets 208 150 - 400 K/uL   nRBC 1.1 (H) 0.0 - 0.2 %   Neutrophils Relative % 68 %   Neutro Abs 7.0 1.7 - 7.7 K/uL   Lymphocytes Relative 16 %   Lymphs Abs 1.6 0.7 - 4.0 K/uL   Monocytes Relative 10 %   Monocytes Absolute 1.0 0.1 - 1.0 K/uL   Eosinophils Relative 1 %   Eosinophils Absolute 0.1 0.0 - 0.5 K/uL   Basophils Relative 1 %   Basophils Absolute 0.1 0.0 - 0.1 K/uL   Immature Granulocytes 4 %   Abs Immature Granulocytes 0.37 (H) 0.00 - 0.07 K/uL  Urinalysis, Routine w reflex microscopic  Result Value Ref Range   Color, Urine YELLOW YELLOW   APPearance CLEAR CLEAR   Specific Gravity, Urine 1.015 1.005 - 1.030   pH 5.0 5.0 - 8.0   Glucose, UA NEGATIVE NEGATIVE mg/dL   Hgb urine dipstick NEGATIVE NEGATIVE   Bilirubin Urine NEGATIVE NEGATIVE   Ketones, ur NEGATIVE NEGATIVE mg/dL   Protein, ur NEGATIVE NEGATIVE mg/dL  Nitrite NEGATIVE NEGATIVE   Leukocytes, UA NEGATIVE NEGATIVE   Dg Chest 2 View  Result Date: 06/23/2018 CLINICAL  DATA:  Lethargy.  Hallucinations. EXAM: CHEST - 2 VIEW COMPARISON:  10/16/2017 FINDINGS: Power port tip is in the SVC above the right atrium. Artifact overlies the chest. Heart size is normal. The right lung is clear. Mild density at the left lung base, similar to previous studies. This could be scarring or mild active atelectasis or left base pneumonia. No effusion. IMPRESSION: Mild chronic patchy density at the left lung base. This could be scarring. Cannot completely rule out a degree of active left base atelectasis or infiltrate. Electronically Signed   By: Nelson Chimes M.D.   On: 06/23/2018 12:43   Mm Digital Screening Bilateral  Result Date: 06/04/2018 CLINICAL DATA:  Screening. EXAM: DIGITAL SCREENING BILATERAL MAMMOGRAM WITH CAD COMPARISON:  Previous exam(s). ACR Breast Density Category c: The breast tissue is heterogeneously dense, which may obscure small masses. FINDINGS: There are no findings suspicious for malignancy. Images were processed with CAD. IMPRESSION: No mammographic evidence of malignancy. A result letter of this screening mammogram will be mailed directly to the patient. RECOMMENDATION: Screening mammogram in one year. (Code:SM-B-01Y) BI-RADS CATEGORY  1: Negative. Electronically Signed   By: Lajean Manes M.D.   On: 06/04/2018 08:24  She will receive oral potassium supplementation prior to discharge. Initial Impression / Assessment and Plan / ED Course  I have reviewed the triage vital signs and the nursing notes.  Pertinent labs & imaging results that were available during my care of the patient were reviewed by me and considered in my medical decision making (see chart for details).     Chest x-ray viewed by me.  Clinical suspicion of pneumonia is low and that she has no fever no leukocytosis no shortness of breath lungs clear to auscultation.  Will discontinue prednisone.Will continue azithromycon in abundance of caution.  Discussed with Dr.Arora with any illness person with  MS can have transient delirium.  Patient has remained of normal sensorium since arrival here.  She will be discharged.  Follow-up with neurologist as an outpatient  Lab work consistent with mild hypokalemia and mild anemia.  She will receive oral potassium supplementation prior to discharge Final Clinical Impressions(s) / ED Diagnoses  Diagnosis #1 hallucinations #2 hypokalemia #3 anemia Final diagnoses:  None    ED Discharge Orders    None       Orlie Dakin, MD 06/23/18 1531

## 2018-06-23 NOTE — ED Notes (Signed)
Flushed pt's port with Heparin.  Port is now deaccessed.

## 2018-06-25 ENCOUNTER — Telehealth: Payer: Self-pay | Admitting: Neurology

## 2018-06-25 NOTE — Telephone Encounter (Signed)
Dr. Felecia Shelling- FYI only.   Called pt back. Scheduled appt for 07/08/18 at 2pm w/ Dr. Felecia Shelling. Advised her to f/u with PCP if needed and go back to ED if sx worsen or she has new sx. She is feeling better since seen in ED. Still on azithromycin.

## 2018-06-25 NOTE — Telephone Encounter (Signed)
Patient was seen in the ED on 06-23-18 for possible pneumonia and was told to f/u with Dr. Felecia Shelling for this and elevated liver enzymes. I advised Dr. Felecia Shelling is out of the office and will send to his nurse.

## 2018-07-03 NOTE — Telephone Encounter (Signed)
Called, LVM for pt to call about upcoming appt on 07/08/18. Asking if she can come at 1pm, check in 1230pm instead of 2pm for appt.

## 2018-07-03 NOTE — Telephone Encounter (Signed)
Pt called and spoke with phone staff. Agreeable to come at 1pm instead, check in 1230pm. I changed her appt time.

## 2018-07-08 ENCOUNTER — Encounter: Payer: Self-pay | Admitting: Neurology

## 2018-07-08 ENCOUNTER — Ambulatory Visit (INDEPENDENT_AMBULATORY_CARE_PROVIDER_SITE_OTHER): Payer: Commercial Managed Care - PPO | Admitting: Neurology

## 2018-07-08 ENCOUNTER — Ambulatory Visit: Payer: Self-pay | Admitting: Neurology

## 2018-07-08 VITALS — BP 101/65 | HR 76

## 2018-07-08 DIAGNOSIS — G35 Multiple sclerosis: Secondary | ICD-10-CM

## 2018-07-08 DIAGNOSIS — R441 Visual hallucinations: Secondary | ICD-10-CM

## 2018-07-08 DIAGNOSIS — G8250 Quadriplegia, unspecified: Secondary | ICD-10-CM | POA: Diagnosis not present

## 2018-07-08 DIAGNOSIS — R4182 Altered mental status, unspecified: Secondary | ICD-10-CM

## 2018-07-08 DIAGNOSIS — N399 Disorder of urinary system, unspecified: Secondary | ICD-10-CM | POA: Diagnosis not present

## 2018-07-08 DIAGNOSIS — Z79899 Other long term (current) drug therapy: Secondary | ICD-10-CM

## 2018-07-08 DIAGNOSIS — Z993 Dependence on wheelchair: Secondary | ICD-10-CM

## 2018-07-08 DIAGNOSIS — R5383 Other fatigue: Secondary | ICD-10-CM

## 2018-07-08 NOTE — Progress Notes (Signed)
[facility]  [name] DOB: November 11, 1965  REFERRING CLINICIAN: Adah Salvage, [facility] HISTORY FROM: patient  REASON FOR VISIT: MS   HISTORICAL  CHIEF COMPLAINT:  Chief Complaint  Patient presents with  . Follow-up    RM 12 with husband and caretaker, Hildred Laser. Last seen 03/06/18. Here to f/u on elevated liver enzymes.   . Multiple Sclerosis    On Tysabri. Last infusion: Mid September.   Last JCV: 03/19/18 JCV ab Positive 0.74  . Gait Problem    Non-ambulatory. In electric wheelchair in office today.     HISTORY OF PRESENT ILLNESS:  Vanessa Short is a 52 y.o. woman diagnosed with MS in 1989.   Update 07/08/2018: Her last JCV Ab was positive at 0.74 so we changed the Tysabri from every 4 weeks to 6 weeks.    Her last infusion was 8 weeks ago as she had an infection and ws on antibiotics a few weeks ago.    She had a respiratory infection last month and was prescribed antibiotics and a a steroid.   She went to the ED and received a new prescription, doxycycline (she just completed).     She had no fever but she perceived that the temperature ws up and down.   She has more hallucinations since the recent infection.   She took one risperidone but it made her very lethargic.    No coughing, urine is not cloudy.     CXR 11/10 showed some left lower patchiness but no clear pneumonia.      CT scan had shown gallstones.  She is not c/o pain.      Currently, she is very lethargic, more so than typical.   She is weaker in hands...legs always weak.     She is on baclofen 20 mg po qid and dantrolene tid.     Update 03/06/2018: She continues on Tysabri and she tolerates it well.  She feels the MS has been stable.  She has had no exacerbations.  When last checked, she had a low positive JCV antibody implying a 1: 2000 risk of PML after 2 years.  She has severe weakness in her legs associated with spasticity and is in a wheelchair.  She takes baclofen  (60-70 mg daily) and dantrolene 50 mg tid to help with some of the phasic spasm episodes.    She has weakness in her hands and feels strength has improved in her hands on Tysabri.   She also can feed herself better.   She has mild dysphagia but voice is normal.   She has had dysesthesias and they are helped by Trileptal.   Her left vision is reduced and she will sometimes have diplopia looking left.  She feels her bladder function is doing well on Vesicare.  She has constipation helped by Amitiza.      Fatigue has done better on Tysabri.  She usually falls asleep easily but will have sleep maintenance insomnia.  She denies any more issues with hallucinations.  Update 10/31/2017: She feels her MS is stable. .  She has had 2 Tysabri infusions with the first 1 in February and the last one yesterday.  JCV antibody is indeterminate at 0.35 but the inhibition assay was positive.  We have discussed that there is a small risk of PML that could increase the longer she is on the medication.  She does feel that her fatigue has done better since starting Tysabri and she feels that  her hands are doing better as well.    She has severe weakness in the legs with spasticity. Spasticity is worse when anxious.    She is taking baclofen 5-6 pills (10 mg each) daily. She took Marinol 2.5 mg but had excessive lethargy and quit.   That has helped her some.  Dysesthesias are much better and she is only on Trileptal now (stopped lamotrigine and amitriptyline).    Bladder function is doing well on Vesicare.  Amitiza helps constipation.   She denies any major problems with her vision but will sometimes have diplopia when she looks to the left.  She has had issues with fatigue though it is better on the Tysabri.  Most nights, she will fall asleep well but will often wake up.  This is worse since her GYN surgery and going through menopause.  She has had some depression that is fairly stable.  In the past she was having visual  hallucinations sometimes seeing her husband when he is not around.      Update 07/03/2017:    Her MS is doing about the same.   She does not note any definite new symptoms or exacerbation but she does feel that her arm strength, much worse with her pneumonia and has not returned to baseline. Spasticity is still a problem in the legs.     For spasticity,she is on baclofen 10 mg po 5-6 pills a day.      She was taking Marinol 2.5 mg bid but felt a little out of it and backed off to just once a day which is more beneficial for her.   Bladder is doing ok on Vesicare.   When she looks to the far left she notes diplopia and she shuts her left eye to avoid.    She notes some fatigue but this is about the same as at the last visit. She is sleeping well most nights but will wake up more if her husbands snoring is loud.    She has depression. He feels this is stable patient does well. She is not having as many hallucinations.  From 02/27/2017: MS:   She is on Aubagio,  she tolerates it well. She has noted some worsening while on the medication clinically. In the past, we have discussed ocrelizumab.  Gait/Strength/sensation:  Legs are doing the same with severe weakness and spasticity.  Dantrolene has not helped much for spasticity.   She takes baclofen 4.5 pills a day and we discussed increasing this and decreasing the dantrolene.   Spasms often occur when she lays down in bed.   She has dysesthetic nerve pain in her legs, helped by amitriptyline and lamotrigine.     In the past, we tried Botox injections into the left arm but  Botox did not improve functioning.   Writing has become more difficulty. She also has had more problems controlling her wheelchair and has had control on her current wheelchair.    Sensation/pain:  She has right anterior shoulder region dysesthesia but this is milder than last year.  Gabapentin was not tolerated.    Lamotrigine 150 mg po bid with amitriptyline helped with dysesthesia at first  but less now.   We just went up on the lamotrigine.    Vision:   Vision is about the same.  She has mild decreased acuity and occasional diplopia.        Bladder/Bowel:  She has bladder dysfunction with some benefit form Vesicare.   She more  incontinence and always uses Depends.  She is on Linzess due to many of her med's making constipation worse.  She also has ulcerative colitis and requires frequent colonoscopies  Fatigue/sleep:   She has daily fatigue .   She has some insomnia at times.     She has nocturia at night and sleep is variable.    Hallucinations:  Prior to the hospitalization, she would get occasional mild visual hallucinations. These are occurring more. When she was at her worse, the last week of December in the first couple days in January, she had more hallucinations and also had severe confusion.    Mood/Cognition     she notes a mild depression. She has some increased stress with some family issues such as being the executor of her father's will and  having a teenage daughter. There is mild anxiety. Earlier this year, in association with a severe infection requiring hospitalization, she had cognitive issues. Back to baseline and currently she is doing well with mild reduced attention..  MS History:  She presented with a Lhermite's syndrome in 1988 or 1989.   MRIs of the head and neck were performed. They showed lesions consistent with the diagnosis of multiple sclerosis. Lumbar puncture was not necessary. Over the next 15 years, she would get some exacerbations and have courses of steroids. However, a disease modifying therapy was not prescribed. By 2004, she had difficulty with her gait and would often have to lean on somebody for support.  She started Copaxone that year and stayed on it for about one half years. She stopped due to a lot of itching. She then switched to Rebif. While on Copaxone Rebif she continues to have exacerbations and further difficulties with her gait. About  2012 she switched to [facility] and has been on that medicine since. She tolerates Aubagio well but is not sure how well it is working for her.   She was off Aubagio x 2-3 weeks in 2013 and felt she did worse.  Last MRI's about 3 years ago.    REVIEW OF SYSTEMS:  Constitutional: No fevers, chills, sweats, or change in appetite.  Has fatigue and poor slep Eyes: see above.   No eye pain Ear, nose and throat: No hearing loss, ear pain, nasal congestion, sore throat Cardiovascular: No chest pain, palpitations Respiratory:  No shortness of breath at rest or with exertion.   No wheezes GastrointestinaI: Has UC - doing well.   No nausea, vomiting, diarrhea, abdominal pain, fecal incontinence Genitourinary:  see above Musculoskeletal:  No neck pain, back pain Integumentary: No rash, pruritus, skin lesions Neurological: as above Psychiatric: as above. Endocrine: No palpitations, diaphoresis, change in appetite, change in weigh or increased thirst Hematologic/Lymphatic:  No anemia, purpura, petechiae. Allergic/Immunologic: No itchy/runny eyes, nasal congestion, recent allergic reactions, rashes  ALLERGIES: Allergies  Allergen Reactions  . Oxycodone-Acetaminophen Other (See Comments)    Other  . Risperidone And Related     Became very lethargic  . Penicillins Rash    Has patient had a PCN reaction causing immediate rash, facial/tongue/throat swelling, SOB or lightheadedness with hypotension: NO Has patient had a PCN reaction causing severe rash involving mucus membranes or skin necrosis: NO Has patient had a PCN reaction that required hospitalization NO Has patient had a PCN reaction occurring within the last 10 years:NO If all of the above answers are "NO", then may proceed with Cephalosporin use.    HOME MEDICATIONS: Outpatient Medications Prior to Visit  Medication Sig Dispense Refill  .  acyclovir (ZOVIRAX) 400 MG tablet Take 400 mg by mouth daily. Take every day per patient  0  . aspirin  EC 81 MG tablet Take 81 mg by mouth daily.    Marland Kitchen b complex vitamins tablet Take 1 tablet by mouth daily.     . baclofen (LIORESAL) 20 MG tablet TAKE 1 TABLET UP TO 4 TIMES DAILY 360 tablet 4  . cholecalciferol (VITAMIN D) 1000 UNITS tablet Take 1,000 Units by mouth daily.     . clobetasol cream (TEMOVATE) 0.05 % APPLY TO NAILS DAILY  2  . dantrolene (DANTRIUM) 50 MG capsule TAKE 1 CAPSULE (50 MG TOTAL) BY MOUTH 3 (THREE) TIMES DAILY. 270 capsule 3  . FLUoxetine (PROZAC) 40 MG capsule Take 1 capsule (40 mg total) by mouth daily. 90 capsule 3  . folic acid (FOLVITE) 568 MCG tablet Take 400 mcg by mouth daily.     . furosemide (LASIX) 40 MG tablet Take 1 tablet (40 mg total) by mouth 2 (two) times daily. 30 tablet   . hydrocortisone cream 1 % Apply 1 application topically 2 (two) times daily as needed for itching.    Marland Kitchen ibuprofen (ADVIL,MOTRIN) 200 MG tablet Take 200 mg by mouth 3 (three) times daily.     Marland Kitchen labetalol (NORMODYNE) 100 MG tablet Take 100 mg by mouth daily.    Marland Kitchen lamoTRIgine (LAMICTAL) 200 MG tablet Take 200 mg by mouth 3 (three) times daily.    Marland Kitchen lidocaine (XYLOCAINE) 2 % solution Lidocaine Viscous 2 % mucosal solution    . lidocaine-prilocaine (EMLA) cream Apply one inch before port-a cath access prn 30 g 3  . mesalamine (PENTASA) 500 MG CR capsule Take 1,000 mg by mouth 3 (three) times daily.     . Multiple Vitamin (MULTIVITAMIN) tablet Take 1 tablet by mouth daily.    . natalizumab (TYSABRI) 300 MG/15ML injection Inject 300 mg into the vein every 28 (twenty-eight) days.    Marland Kitchen omeprazole (PRILOSEC) 40 MG capsule Take 40 mg by mouth 2 (two) times daily.    . OXcarbazepine (TRILEPTAL) 150 MG tablet Take 1 tablet (150 mg total) by mouth 3 (three) times daily. 270 tablet 3  . sodium chloride (OCEAN) 0.65 % SOLN nasal spray Place 1 spray into both nostrils as needed for congestion.  0  . solifenacin (VESICARE) 10 MG tablet Take 10 mg by mouth daily.     Marland Kitchen zinc oxide 20 % ointment Apply 1  application topically 3 (three) times daily. Apply to bilateral buttocks every shift as a preventative measure     No facility-administered medications prior to visit.     PAST MEDICAL HISTORY: Past Medical History:  Diagnosis Date  . Headache   . History of kidney stones   . Hypertension   . Kidney stones   . Movement disorder   . Multiple sclerosis ([place])   . Neuropathy   . Ulcerative colitis ([place])   . Vision abnormalities     PAST SURGICAL HISTORY: Past Surgical History:  Procedure Laterality Date  . ENDOMETRIAL ABLATION  10/10/2017  . KIDNEY STONE SURGERY    . PORTACATH PLACEMENT N/A 10/16/2017   Procedure: ULTRA SOUND GUIDED INSERTION PORT-A-CATH ERAS PATHWAY;  Surgeon: Kieth Brightly Arta Bruce, MD;  Location: [facility];  Service: General;  Laterality: N/A;    FAMILY HISTORY: Family History  Problem Relation Age of Onset  . Dementia Mother   . Hypertension Father   . Hyperlipidemia Father   . Diabetes Father   . Heart  disease Father   . Breast cancer Neg Hx     SOCIAL HISTORY:  Social History   Socioeconomic History  . Marital status: Married    Spouse name: Not on file  . Number of children: Not on file  . Years of education: Not on file  . Highest education level: Not on file  Occupational History  . Not on file  Social Needs  . Financial resource strain: Not on file  . Food insecurity:    Worry: Not on file    Inability: Not on file  . Transportation needs:    Medical: Not on file    Non-medical: Not on file  Tobacco Use  . Smoking status: Never Smoker  . Smokeless tobacco: Never Used  Substance and Sexual Activity  . Alcohol use: No  . Drug use: No  . Sexual activity: Not on file  Lifestyle  . Physical activity:    Days per week: Not on file    Minutes per session: Not on file  . Stress: Not on file  Relationships  . Social connections:    Talks on phone: Not on file    Gets together: Not on file    Attends religious service: Not on file     Active member of club or organization: Not on file    Attends meetings of clubs or organizations: Not on file    Relationship status: Not on file  . Intimate partner violence:    Fear of current or ex partner: Not on file    Emotionally abused: Not on file    Physically abused: Not on file    Forced sexual activity: Not on file  Other Topics Concern  . Not on file  Social History Narrative  . Not on file     PHYSICAL EXAM  Vitals:   07/08/18 1259  BP: 101/65  Pulse: 76  SpO2: 98%    There is no height or weight on file to calculate BMI.   General: The patient is well-developed and well-nourished and in no acute distress  Skin: Extremities are without rash.  Changes in and around 5th digit nail on the right.       Neurologic Exam  Mental status: The patient is lethargic and oriented x 2 at the time of the examination.  Focus and attention was reduced.  Speech is normal.  Cranial nerves: Extraocular movements are full.   Facial strength and sensation is normal.  Trapezius strength is normal.  There is no dysarthria.  No obvious hearing deficits are noted.  Motor:  Muscle bulk is normal . Tone is increased in the legs, right greater than left. Tone is also increase in the arms, slightly more on the right.  She has no definite strength in the legs.. Strength is 2-/5 in the left arm and 2/5 in the right arm. Tone is increased in the legs, right greater than left.  She also has increased tone in the arms   Sensory: She appears to have symmetric vibration sensation in the arms and reduced vibration sensation at the knees.  Gait and station: She can not stand or walk.    Reflexes: She has increased reflexes in her legs.    DIAGNOSTIC DATA (LABS, IMAGING, TESTING) - I reviewed patient records, labs, notes, testing and imaging myself where available.  Lab Results  Component Value Date   WBC 10.1 06/23/2018   HGB 11.4 (L) 06/23/2018   HCT 36.1 06/23/2018   MCV 98.4  06/23/2018   PLT 208 06/23/2018      Component Value Date/Time   NA 141 06/23/2018 1259   NA 143 07/03/2017 1615   K 3.4 (L) 06/23/2018 1259   CL 104 06/23/2018 1259   CO2 27 06/23/2018 1259   GLUCOSE 112 (H) 06/23/2018 1259   BUN 21 (H) 06/23/2018 1259   BUN 15 07/03/2017 1615   CREATININE 0.86 06/23/2018 1259   CALCIUM 9.3 06/23/2018 1259   PROT 7.0 07/03/2017 1615   ALBUMIN 4.5 07/03/2017 1615   AST 17 07/03/2017 1615   ALT 12 07/03/2017 1615   ALKPHOS 94 07/03/2017 1615   BILITOT 0.3 07/03/2017 1615   GFRNONAA >60 06/23/2018 1259   GFRAA >60 06/23/2018 1259       ASSESSMENT AND PLAN  Multiple sclerosis (HCC) - Plan: CBC with Differential/Platelet, Comprehensive metabolic panel, Thyroid Panel With TSH  Altered mental status, unspecified altered mental status type - Plan: CBC with Differential/Platelet, Comprehensive metabolic panel, Thyroid Panel With TSH  Spastic quadriparesis (HCC)  Urinary disorder  Other fatigue  Visual hallucination  High risk medication use  Wheelchair bound   1.   I am concerned that she might be having an infection explaining her lethargy.  We will check a CBC with differential, CMP (reportedly had increased liver function test recently) and thyroid panel.  She is advised to go to the emergency room if the lethargy gets worse or any fevers as she needs to be catheterized to get the urine sample and has difficulty getting a chest x-ray as an outpatient.   2.   I like to get her back on the Tysabri as she is a couple weeks late.  Due to the elevated JCV antibody we will do every 6 weeks.   3.   Hold to reduce baclofen if lethargic. 4.   Return to see me in 4 months or sooner if new or worsening neurologic symptoms.     Chalice Philbert A. Felecia Shelling, MD, PhD 70/62/3762, 8:31 PM Certified in Neurology, Clinical Neurophysiology, Sleep Medicine, Pain Medicine and Neuroimaging  [facility] [address], [place], [place]  [address] [phone]

## 2018-07-09 ENCOUNTER — Telehealth: Payer: Self-pay | Admitting: Neurology

## 2018-07-09 LAB — CBC WITH DIFFERENTIAL/PLATELET
BASOS: 1 %
Basophils Absolute: 0.1 10*3/uL (ref 0.0–0.2)
EOS (ABSOLUTE): 0.6 10*3/uL — AB (ref 0.0–0.4)
Eos: 4 %
HEMOGLOBIN: 10.6 g/dL — AB (ref 11.1–15.9)
Hematocrit: 31.5 % — ABNORMAL LOW (ref 34.0–46.6)
IMMATURE GRANS (ABS): 0.1 10*3/uL (ref 0.0–0.1)
Immature Granulocytes: 1 %
LYMPHS: 15 %
Lymphocytes Absolute: 1.9 10*3/uL (ref 0.7–3.1)
MCH: 31.5 pg (ref 26.6–33.0)
MCHC: 33.7 g/dL (ref 31.5–35.7)
MCV: 94 fL (ref 79–97)
MONOCYTES: 10 %
Monocytes Absolute: 1.3 10*3/uL — ABNORMAL HIGH (ref 0.1–0.9)
NEUTROS ABS: 9.1 10*3/uL — AB (ref 1.4–7.0)
Neutrophils: 69 %
Platelets: 160 10*3/uL (ref 150–450)
RBC: 3.36 x10E6/uL — ABNORMAL LOW (ref 3.77–5.28)
RDW: 14.9 % (ref 12.3–15.4)
WBC: 13.1 10*3/uL — ABNORMAL HIGH (ref 3.4–10.8)

## 2018-07-09 LAB — COMPREHENSIVE METABOLIC PANEL
A/G RATIO: 2 (ref 1.2–2.2)
ALBUMIN: 3.9 g/dL (ref 3.5–5.5)
ALT: 19 IU/L (ref 0–32)
AST: 11 IU/L (ref 0–40)
Alkaline Phosphatase: 112 IU/L (ref 39–117)
BILIRUBIN TOTAL: 0.4 mg/dL (ref 0.0–1.2)
BUN/Creatinine Ratio: 37 — ABNORMAL HIGH (ref 9–23)
BUN: 31 mg/dL — ABNORMAL HIGH (ref 6–24)
CHLORIDE: 106 mmol/L (ref 96–106)
CO2: 25 mmol/L (ref 20–29)
Calcium: 9.3 mg/dL (ref 8.7–10.2)
Creatinine, Ser: 0.83 mg/dL (ref 0.57–1.00)
GFR calc non Af Amer: 81 mL/min/{1.73_m2} (ref >59)
GFR, EST AFRICAN AMERICAN: 93 mL/min/{1.73_m2} (ref >59)
Globulin, Total: 2 g/dL (ref 1.5–4.5)
Glucose: 93 mg/dL (ref 65–99)
Potassium: 3.3 mmol/L — ABNORMAL LOW (ref 3.5–5.2)
SODIUM: 149 mmol/L — AB (ref 134–144)
TOTAL PROTEIN: 5.9 g/dL — AB (ref 6.0–8.5)

## 2018-07-09 LAB — THYROID PANEL WITH TSH
FREE THYROXINE INDEX: 1.2 (ref 1.2–4.9)
T3 Uptake Ratio: 23 % — ABNORMAL LOW (ref 24–39)
T4, Total: 5 ug/dL (ref 4.5–12.0)
TSH: 4.12 u[IU]/mL (ref 0.450–4.500)

## 2018-07-09 NOTE — Telephone Encounter (Signed)
I called Vanessa Short let her know the results of the lab work.  She sounds much better on the telephone today than she did in the office yesterday.  The BUN/creatinine ratio was elevated so she may have been a little dehydrated yesterday.  Additionally, the white blood cell count was mildly elevated.   She did denies any fevers.  Advised that if she does note any change in her urine or if she has coughing or fever that she should be checked out for infection.

## 2018-07-19 ENCOUNTER — Telehealth: Payer: Self-pay | Admitting: Neurology

## 2018-07-19 NOTE — Telephone Encounter (Signed)
Pt requesting a call stating she is needing to know when her next infusion appt can be scheduled.

## 2018-07-19 NOTE — Telephone Encounter (Signed)
Given to intrafusion to contact patient about scheduling her next infusion.

## 2018-07-29 ENCOUNTER — Inpatient Hospital Stay (HOSPITAL_COMMUNITY)
Admission: EM | Admit: 2018-07-29 | Discharge: 2018-07-31 | DRG: 058 | Disposition: A | Discharge status: Home / Self Care | Payer: Commercial Managed Care - PPO | Attending: Family Medicine | Admitting: Family Medicine

## 2018-07-29 ENCOUNTER — Encounter (HOSPITAL_COMMUNITY): Payer: Self-pay | Admitting: Emergency Medicine

## 2018-07-29 ENCOUNTER — Emergency Department (HOSPITAL_COMMUNITY): Payer: Commercial Managed Care - PPO

## 2018-07-29 ENCOUNTER — Other Ambulatory Visit: Payer: Self-pay

## 2018-07-29 DIAGNOSIS — F329 Major depressive disorder, single episode, unspecified: Secondary | ICD-10-CM | POA: Diagnosis present

## 2018-07-29 DIAGNOSIS — R29898 Other symptoms and signs involving the musculoskeletal system: Secondary | ICD-10-CM | POA: Diagnosis present

## 2018-07-29 DIAGNOSIS — G629 Polyneuropathy, unspecified: Secondary | ICD-10-CM | POA: Diagnosis present

## 2018-07-29 DIAGNOSIS — F419 Anxiety disorder, unspecified: Secondary | ICD-10-CM | POA: Diagnosis present

## 2018-07-29 DIAGNOSIS — G35 Multiple sclerosis: Secondary | ICD-10-CM | POA: Diagnosis present

## 2018-07-29 DIAGNOSIS — Z79899 Other long term (current) drug therapy: Secondary | ICD-10-CM | POA: Diagnosis not present

## 2018-07-29 DIAGNOSIS — Z23 Encounter for immunization: Secondary | ICD-10-CM

## 2018-07-29 DIAGNOSIS — F332 Major depressive disorder, recurrent severe without psychotic features: Secondary | ICD-10-CM | POA: Diagnosis not present

## 2018-07-29 DIAGNOSIS — Z886 Allergy status to analgesic agent status: Secondary | ICD-10-CM

## 2018-07-29 DIAGNOSIS — G8220 Paraplegia, unspecified: Secondary | ICD-10-CM

## 2018-07-29 DIAGNOSIS — G259 Extrapyramidal and movement disorder, unspecified: Secondary | ICD-10-CM | POA: Diagnosis present

## 2018-07-29 DIAGNOSIS — Z95828 Presence of other vascular implants and grafts: Secondary | ICD-10-CM | POA: Diagnosis not present

## 2018-07-29 DIAGNOSIS — R8281 Pyuria: Secondary | ICD-10-CM | POA: Diagnosis present

## 2018-07-29 DIAGNOSIS — Z885 Allergy status to narcotic agent status: Secondary | ICD-10-CM

## 2018-07-29 DIAGNOSIS — R441 Visual hallucinations: Secondary | ICD-10-CM | POA: Diagnosis not present

## 2018-07-29 DIAGNOSIS — R402140 Coma scale, eyes open, spontaneous, unspecified time: Secondary | ICD-10-CM | POA: Diagnosis present

## 2018-07-29 DIAGNOSIS — Z791 Long term (current) use of non-steroidal anti-inflammatories (NSAID): Secondary | ICD-10-CM

## 2018-07-29 DIAGNOSIS — R4182 Altered mental status, unspecified: Secondary | ICD-10-CM | POA: Diagnosis present

## 2018-07-29 DIAGNOSIS — Z880 Allergy status to penicillin: Secondary | ICD-10-CM

## 2018-07-29 DIAGNOSIS — G8250 Quadriplegia, unspecified: Secondary | ICD-10-CM | POA: Diagnosis present

## 2018-07-29 DIAGNOSIS — R442 Other hallucinations: Secondary | ICD-10-CM | POA: Diagnosis present

## 2018-07-29 DIAGNOSIS — K5900 Constipation, unspecified: Secondary | ICD-10-CM | POA: Diagnosis present

## 2018-07-29 DIAGNOSIS — R208 Other disturbances of skin sensation: Secondary | ICD-10-CM | POA: Diagnosis present

## 2018-07-29 DIAGNOSIS — I110 Hypertensive heart disease with heart failure: Secondary | ICD-10-CM | POA: Diagnosis present

## 2018-07-29 DIAGNOSIS — Z993 Dependence on wheelchair: Secondary | ICD-10-CM | POA: Diagnosis not present

## 2018-07-29 DIAGNOSIS — I5032 Chronic diastolic (congestive) heart failure: Secondary | ICD-10-CM | POA: Diagnosis present

## 2018-07-29 DIAGNOSIS — Z888 Allergy status to other drugs, medicaments and biological substances status: Secondary | ICD-10-CM

## 2018-07-29 DIAGNOSIS — R440 Auditory hallucinations: Secondary | ICD-10-CM | POA: Diagnosis present

## 2018-07-29 DIAGNOSIS — N3281 Overactive bladder: Secondary | ICD-10-CM | POA: Diagnosis present

## 2018-07-29 DIAGNOSIS — R443 Hallucinations, unspecified: Secondary | ICD-10-CM | POA: Diagnosis present

## 2018-07-29 DIAGNOSIS — R402250 Coma scale, best verbal response, oriented, unspecified time: Secondary | ICD-10-CM | POA: Diagnosis present

## 2018-07-29 DIAGNOSIS — R402360 Coma scale, best motor response, obeys commands, unspecified time: Secondary | ICD-10-CM | POA: Diagnosis present

## 2018-07-29 DIAGNOSIS — T50996A Underdosing of other drugs, medicaments and biological substances, initial encounter: Secondary | ICD-10-CM | POA: Diagnosis present

## 2018-07-29 DIAGNOSIS — R404 Transient alteration of awareness: Secondary | ICD-10-CM

## 2018-07-29 DIAGNOSIS — I10 Essential (primary) hypertension: Secondary | ICD-10-CM | POA: Diagnosis not present

## 2018-07-29 DIAGNOSIS — K5190 Ulcerative colitis, unspecified, without complications: Secondary | ICD-10-CM | POA: Diagnosis present

## 2018-07-29 DIAGNOSIS — Z87442 Personal history of urinary calculi: Secondary | ICD-10-CM

## 2018-07-29 LAB — URINALYSIS, ROUTINE W REFLEX MICROSCOPIC
Bacteria, UA: NONE SEEN
Bilirubin Urine: NEGATIVE
Glucose, UA: NEGATIVE mg/dL
KETONES UR: NEGATIVE mg/dL
Nitrite: NEGATIVE
PROTEIN: NEGATIVE mg/dL
Specific Gravity, Urine: 1.017 (ref 1.005–1.030)
pH: 6 (ref 5.0–8.0)

## 2018-07-29 LAB — COMPREHENSIVE METABOLIC PANEL
ALT: 24 U/L (ref 0–44)
AST: 21 U/L (ref 15–41)
Albumin: 3.5 g/dL (ref 3.5–5.0)
Alkaline Phosphatase: 91 U/L (ref 38–126)
Anion gap: 10 (ref 5–15)
BILIRUBIN TOTAL: 0.5 mg/dL (ref 0.3–1.2)
BUN: 27 mg/dL — ABNORMAL HIGH (ref 6–20)
CHLORIDE: 103 mmol/L (ref 98–111)
CO2: 29 mmol/L (ref 22–32)
CREATININE: 0.97 mg/dL (ref 0.44–1.00)
Calcium: 9.7 mg/dL (ref 8.9–10.3)
GFR calc Af Amer: >60 mL/min (ref >60)
Glucose, Bld: 101 mg/dL — ABNORMAL HIGH (ref 70–99)
POTASSIUM: 4.2 mmol/L (ref 3.5–5.1)
Sodium: 142 mmol/L (ref 135–145)
TOTAL PROTEIN: 6.8 g/dL (ref 6.5–8.1)

## 2018-07-29 LAB — CBC
HCT: 35.8 % — ABNORMAL LOW (ref 36.0–46.0)
Hemoglobin: 10.8 g/dL — ABNORMAL LOW (ref 12.0–15.0)
MCH: 31.7 pg (ref 26.0–34.0)
MCHC: 30.2 g/dL (ref 30.0–36.0)
MCV: 105 fL — AB (ref 80.0–100.0)
NRBC: 0.4 % — AB (ref 0.0–0.2)
PLATELETS: 212 10*3/uL (ref 150–400)
RBC: 3.41 MIL/uL — ABNORMAL LOW (ref 3.87–5.11)
RDW: 16.2 % — ABNORMAL HIGH (ref 11.5–15.5)
WBC: 10.3 10*3/uL (ref 4.0–10.5)

## 2018-07-29 LAB — RAPID URINE DRUG SCREEN, HOSP PERFORMED
Amphetamines: NOT DETECTED
Barbiturates: NOT DETECTED
Benzodiazepines: NOT DETECTED
Cocaine: NOT DETECTED
Opiates: NOT DETECTED
Tetrahydrocannabinol: NOT DETECTED

## 2018-07-29 LAB — VITAMIN B12: Vitamin B-12: 641 pg/mL (ref 180–914)

## 2018-07-29 LAB — TSH: TSH: 7.993 u[IU]/mL — ABNORMAL HIGH (ref 0.350–4.500)

## 2018-07-29 LAB — ETHANOL: Alcohol, Ethyl (B): <10 mg/dL (ref <10)

## 2018-07-29 MED ORDER — ENOXAPARIN SODIUM 40 MG/0.4ML ~~LOC~~ SOLN
40.0000 mg | SUBCUTANEOUS | Status: DC
  Administered 2018-07-30 – 2018-07-31 (×2): 40 mg via SUBCUTANEOUS
  Filled 2018-07-29 (×3): qty 0.4

## 2018-07-29 MED ORDER — FUROSEMIDE 40 MG PO TABS
40.0000 mg | ORAL_TABLET | Freq: Two times a day (BID) | ORAL | Status: DC
  Administered 2018-07-29 – 2018-07-31 (×4): 40 mg via ORAL
  Filled 2018-07-29 (×2): qty 1
  Filled 2018-07-29: qty 2
  Filled 2018-07-29: qty 1

## 2018-07-29 MED ORDER — GADOBUTROL 1 MMOL/ML IV SOLN
8.0000 mL | Freq: Once | INTRAVENOUS | Status: AC | PRN
  Administered 2018-07-29: 7.5 mL via INTRAVENOUS

## 2018-07-29 MED ORDER — LABETALOL HCL 100 MG PO TABS
100.0000 mg | ORAL_TABLET | Freq: Every day | ORAL | Status: DC
  Administered 2018-07-29 – 2018-07-31 (×3): 100 mg via ORAL
  Filled 2018-07-29 (×3): qty 1

## 2018-07-29 MED ORDER — OLANZAPINE 5 MG PO TABS
5.0000 mg | ORAL_TABLET | Freq: Every day | ORAL | Status: DC
  Administered 2018-07-29 – 2018-07-30 (×2): 5 mg via ORAL
  Filled 2018-07-29 (×2): qty 1

## 2018-07-29 MED ORDER — IBUPROFEN 200 MG PO TABS
200.0000 mg | ORAL_TABLET | Freq: Three times a day (TID) | ORAL | Status: DC
  Administered 2018-07-29 – 2018-07-31 (×5): 200 mg via ORAL
  Filled 2018-07-29 (×5): qty 1

## 2018-07-29 MED ORDER — OXCARBAZEPINE 150 MG PO TABS
150.0000 mg | ORAL_TABLET | Freq: Three times a day (TID) | ORAL | Status: DC
  Administered 2018-07-29 – 2018-07-31 (×5): 150 mg via ORAL
  Filled 2018-07-29 (×7): qty 1

## 2018-07-29 MED ORDER — MESALAMINE ER 250 MG PO CPCR
1000.0000 mg | ORAL_CAPSULE | Freq: Three times a day (TID) | ORAL | Status: DC
  Administered 2018-07-29 – 2018-07-31 (×5): 1000 mg via ORAL
  Filled 2018-07-29 (×7): qty 4

## 2018-07-29 MED ORDER — LIDOCAINE-PRILOCAINE 2.5-2.5 % EX CREA
TOPICAL_CREAM | Freq: Once | CUTANEOUS | Status: AC
  Administered 2018-07-29: 07:00:00 via TOPICAL
  Filled 2018-07-29 (×2): qty 5

## 2018-07-29 MED ORDER — BACLOFEN 20 MG PO TABS
20.0000 mg | ORAL_TABLET | Freq: Four times a day (QID) | ORAL | Status: DC
  Administered 2018-07-29 – 2018-07-31 (×7): 20 mg via ORAL
  Filled 2018-07-29 (×8): qty 1

## 2018-07-29 MED ORDER — LORAZEPAM 2 MG/ML IJ SOLN
1.0000 mg | Freq: Once | INTRAMUSCULAR | Status: AC
  Administered 2018-07-29: 1 mg via INTRAVENOUS
  Filled 2018-07-29: qty 1

## 2018-07-29 MED ORDER — VANCOMYCIN HCL IN DEXTROSE 1-5 GM/200ML-% IV SOLN
1000.0000 mg | Freq: Once | INTRAVENOUS | Status: AC
  Administered 2018-07-29: 1000 mg via INTRAVENOUS
  Filled 2018-07-29: qty 200

## 2018-07-29 MED ORDER — FLUOXETINE HCL 20 MG PO CAPS
40.0000 mg | ORAL_CAPSULE | Freq: Every day | ORAL | Status: DC
  Administered 2018-07-29 – 2018-07-30 (×2): 40 mg via ORAL
  Filled 2018-07-29 (×2): qty 2

## 2018-07-29 MED ORDER — LAMOTRIGINE 100 MG PO TABS: 200.0000 mg | ORAL_TABLET | Freq: Three times a day (TID) | ORAL | Status: DC

## 2018-07-29 MED ORDER — ASPIRIN EC 81 MG PO TBEC
81.0000 mg | DELAYED_RELEASE_TABLET | Freq: Every day | ORAL | Status: DC
  Administered 2018-07-29 – 2018-07-31 (×3): 81 mg via ORAL
  Filled 2018-07-29 (×3): qty 1

## 2018-07-29 MED ORDER — PANTOPRAZOLE SODIUM 40 MG PO TBEC
40.0000 mg | DELAYED_RELEASE_TABLET | Freq: Every day | ORAL | Status: DC
  Administered 2018-07-29 – 2018-07-31 (×3): 40 mg via ORAL
  Filled 2018-07-29 (×3): qty 1

## 2018-07-29 MED ORDER — SODIUM CHLORIDE 0.9 % IV SOLN
2.0000 g | Freq: Once | INTRAVENOUS | Status: AC
  Administered 2018-07-29: 2 g via INTRAVENOUS
  Filled 2018-07-29: qty 2

## 2018-07-29 MED ORDER — ACYCLOVIR 400 MG PO TABS
400.0000 mg | ORAL_TABLET | Freq: Every day | ORAL | Status: DC
  Administered 2018-07-29 – 2018-07-31 (×3): 400 mg via ORAL
  Filled 2018-07-29 (×3): qty 1

## 2018-07-29 MED ORDER — METRONIDAZOLE IN NACL 5-0.79 MG/ML-% IV SOLN
500.0000 mg | Freq: Three times a day (TID) | INTRAVENOUS | Status: DC
  Administered 2018-07-29 (×2): 500 mg via INTRAVENOUS
  Filled 2018-07-29 (×2): qty 100

## 2018-07-29 MED ORDER — LUBIPROSTONE 8 MCG PO CAPS
8.0000 ug | ORAL_CAPSULE | Freq: Two times a day (BID) | ORAL | Status: DC
  Administered 2018-07-29 – 2018-07-31 (×4): 8 ug via ORAL
  Filled 2018-07-29 (×5): qty 1

## 2018-07-29 NOTE — ED Notes (Signed)
Patient transported to MRI 

## 2018-07-29 NOTE — ED Provider Notes (Signed)
[facility] EMERGENCY DEPARTMENT Provider Note   CSN: [id number] Arrival date & time: 07/29/18  0228     History   Chief Complaint Chief Complaint  Patient presents with  . Hallucinations    HPI Vanessa Short is a 52 y.o. female.  HPI  52 year old female comes in with chief complaint of hallucinations.  Patient has history of MS, ulcerative colitis.  According to the patient, over the past month she has been having hallucinations.  She normally has been seeing 2 or 3 people and also hearing 2 voices.  Typically she sees her husband or her son.  Her symptoms have progressed over the last 3 days therefore she decided to come to the ER.  She has had hallucinations in the past associated with infection and with pain medications.  At the moment she is not on any pain medication and denies any nausea, vomiting, fevers, chills.  She does not have UTI-like symptoms.  Patient states that her infusion for MS was discontinued 2 months ago because of her pneumonia.  She was started on  Past Medical History:  Diagnosis Date  . Headache   . History of kidney stones   . Hypertension   . Kidney stones   . Movement disorder   . Multiple sclerosis ([place])   . Neuropathy   . Ulcerative colitis ([place])   . Vision abnormalities     Patient Active Problem List   Diagnosis Date Noted  . Wheelchair bound 02/27/2017  . Chronic diastolic CHF (congestive heart failure) ([place]) 09/30/2016  . Prolonged QT interval   . SIRS (systemic inflammatory response syndrome) ([place]) 09/29/2016  . HCAP (healthcare-associated pneumonia) 09/29/2016  . High risk medication use 09/27/2016  . Acute encephalopathy 08/22/2016  . 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 Altered mental state   . Acute respiratory failure with hypoxia ([place]) 08/07/2016  . CAP (community acquired pneumonia) 08/07/2016  . Left lower lobe pneumonia ([place]) 08/05/2016  .  Hypokalemia 08/05/2016  . Normocytic anemia 08/05/2016  . Slurred speech 08/05/2016  . Ulcerative colitis ([place]) 08/05/2016  . Essential hypertension 08/05/2016  . Subacromial bursitis 05/09/2016  . Visual hallucination 03/14/2016  . Other fatigue 11/09/2015  . Urinary disorder 11/09/2015  . Ear ache 10/13/2015  . Sore throat 10/13/2015  . Hand weakness 01/06/2015  . Multiple sclerosis ([place]) 09/07/2014  . 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 Dysesthesia 09/07/2014  . Depression 09/07/2014    Past Surgical History:  Procedure Laterality Date  . ENDOMETRIAL ABLATION  10/10/2017  . KIDNEY STONE SURGERY    . PORTACATH PLACEMENT N/A 10/16/2017   Procedure: ULTRA SOUND GUIDED INSERTION PORT-A-CATH ERAS PATHWAY;  Surgeon: Kieth Brightly Arta Bruce, MD;  Location: [facility];  Service: General;  Laterality: N/A;     OB History   No obstetric history on file.      Home Medications    Prior to Admission medications   Medication Sig Start Date End Date Taking? Authorizing Provider  acyclovir (ZOVIRAX) 400 MG tablet Take 400 mg by mouth daily. Take every day per patient 08/11/14  Yes [provider]  AMITIZA 8 MCG capsule Take 8 mcg by mouth 2 (two) times daily. 06/11/18  Yes [provider]  aspirin EC 81 MG tablet Take 81 mg by mouth daily.   Yes [provider]  b complex vitamins tablet Take 1 tablet by mouth daily.    Yes [provider]  baclofen (LIORESAL) 20 MG tablet  TAKE 1 TABLET UP TO 4 TIMES DAILY Patient taking differently: Take 20 mg by mouth 4 (four) times daily.  03/06/18  Yes Sater, Nanine Means, MD  cholecalciferol (VITAMIN D) 1000 UNITS tablet Take 1,000 Units by mouth daily.    Yes [provider]  dantrolene (DANTRIUM) 50 MG capsule TAKE 1 CAPSULE (50 MG TOTAL) BY MOUTH 3 (THREE) TIMES DAILY. 10/22/17  Yes Sater, Nanine Means, MD  FLUoxetine (PROZAC) 40 MG capsule Take 1 capsule (40 mg total) by mouth daily. 10/31/17  Yes Sater,  Nanine Means, MD  folic acid (FOLVITE) 779 MCG tablet Take 400 mcg by mouth daily.    Yes [provider]  furosemide (LASIX) 40 MG tablet Take 1 tablet (40 mg total) by mouth 2 (two) times daily. 08/19/16  Yes Rai, Ripudeep K, MD  ibuprofen (ADVIL,MOTRIN) 200 MG tablet Take 200 mg by mouth 3 (three) times daily.    Yes [provider]  labetalol (NORMODYNE) 100 MG tablet Take 100 mg by mouth daily.   Yes [provider]  lamoTRIgine (LAMICTAL) 200 MG tablet Take 200 mg by mouth 3 (three) times daily.   Yes [provider]  mesalamine (PENTASA) 500 MG CR capsule Take 1,000 mg by mouth 3 (three) times daily.    Yes [provider]  Multiple Vitamin (MULTIVITAMIN) tablet Take 1 tablet by mouth daily.   Yes [provider]  natalizumab (TYSABRI) 300 MG/15ML injection Inject 300 mg into the vein every 6 (six) weeks.    Yes [provider]  OLANZapine (ZYPREXA) 5 MG tablet Take 5 mg by mouth at bedtime. 07/19/18 08/18/18 Yes [provider]  omeprazole (PRILOSEC) 40 MG capsule Take 40 mg by mouth 2 (two) times daily.   Yes [provider]  OXcarbazepine (TRILEPTAL) 150 MG tablet Take 1 tablet (150 mg total) by mouth 3 (three) times daily. 10/31/17  Yes Sater, Nanine Means, MD  solifenacin (VESICARE) 10 MG tablet Take 10 mg by mouth daily.    Yes [provider]  tolterodine (DETROL) 2 MG tablet Take 2 mg by mouth every evening. 05/09/18  Yes [provider]  lidocaine-prilocaine (EMLA) cream Apply one inch before port-a cath access prn Patient not taking: Reported on 07/29/2018 12/25/17   Sater, Nanine Means, MD  sodium chloride (OCEAN) 0.65 % SOLN nasal spray Place 1 spray into both nostrils as needed for congestion. Patient not taking: Reported on 07/29/2018 08/19/16   Mendel Corning, MD    Family History Family History  Problem Relation Age of Onset  . Dementia Mother   . Hypertension Father   . Hyperlipidemia  Father   . Diabetes Father   . Heart disease Father   . Breast cancer Neg Hx     Social History Social History   Tobacco Use  . Smoking status: Never Smoker  . Smokeless tobacco: Never Used  Substance Use Topics  . Alcohol use: No  . Drug use: No     Allergies   Oxycodone-acetaminophen; Risperidone and related; and Penicillins   Review of Systems Review of Systems  Constitutional: Positive for activity change.  Respiratory: Negative for cough.   Gastrointestinal: Negative for abdominal pain, nausea and vomiting.  Genitourinary: Negative for flank pain.  Psychiatric/Behavioral: Positive for hallucinations. Negative for suicidal ideas.  All other systems reviewed and are negative.    Physical Exam Updated Vital Signs BP 137/66   Pulse 91   Temp 98.8 F (37.1 C) (Oral)   Resp 16  SpO2 100%   Physical Exam Vitals signs and nursing note reviewed.  Constitutional:      Appearance: She is well-developed.  HENT:     Head: Normocephalic and atraumatic.  Neck:     Musculoskeletal: Normal range of motion and neck supple.  Cardiovascular:     Rate and Rhythm: Normal rate.  Pulmonary:     Effort: Pulmonary effort is normal.  Abdominal:     General: Bowel sounds are normal.     Tenderness: There is no abdominal tenderness.  Skin:    General: Skin is warm and dry.  Neurological:     Mental Status: She is alert and oriented to person, place, and time.  Psychiatric:        Mood and Affect: Mood normal.        Behavior: Behavior normal.        Thought Content: Thought content normal.        Judgment: Judgment normal.      ED Treatments / Results  Labs (all labs ordered are listed, but only abnormal results are displayed) Labs Reviewed  COMPREHENSIVE METABOLIC PANEL - Abnormal; Notable for the following components:      Result Value   Glucose, Bld 101 (*)    BUN 27 (*)    All other components within normal limits  CBC - Abnormal; Notable for the following  components:   RBC 3.41 (*)    Hemoglobin 10.8 (*)    HCT 35.8 (*)    MCV 105.0 (*)    RDW 16.2 (*)    nRBC 0.4 (*)    All other components within normal limits  URINALYSIS, ROUTINE W REFLEX MICROSCOPIC - Abnormal; Notable for the following components:   APPearance HAZY (*)    Hgb urine dipstick MODERATE (*)    Leukocytes, UA TRACE (*)    RBC / HPF >50 (*)    All other components within normal limits  URINE CULTURE    EKG None  Radiology No results found.  Procedures Procedures (including critical care time)  Medications Ordered in ED Medications  metroNIDAZOLE (FLAGYL) IVPB 500 mg (500 mg Intravenous New Bag/Given 07/29/18 0825)  vancomycin (VANCOCIN) IVPB 1000 mg/200 mL premix (has no administration in time range)  LORazepam (ATIVAN) injection 1 mg (has no administration in time range)  lidocaine-prilocaine (EMLA) cream ( Topical Given 07/29/18 0631)  ceFEPIme (MAXIPIME) 2 g in sodium chloride 0.9 % 100 mL IVPB (0 g Intravenous Stopped 07/29/18 0813)     Initial Impression / Assessment and Plan / ED Course  I have reviewed the triage vital signs and the nursing notes.  Pertinent labs & imaging results that were available during my care of the patient were reviewed by me and considered in my medical decision making (see chart for details).     Patient comes in with chief complaint of hallucinations.  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  She does not have any history of psychiatry conditions.  It appears that she has been having hallucinations for the last 1 month.  Hallucinations are both auditory and visual.  Patient according to the husband has also been acting little bit more paranoid.  In the past she has had hallucinations with pain medications and with UTI infection.  When patient had UTI she did not really have any symptoms.  I discussed the case with the neurology.  The plan for now is to get urine analysis and MRI with and without contrast. If the  UA is positive for infection then we will start her on ceftriaxone. If the UA and the MRI are negative, then patient will need psychiatric consultation. Neurology did not think they will be necessary in patient's care if the MRI with and without contrast is normal.  Signing care out to Dr. Tamera Punt will follow up on the UA and the MRI. Plan discussed with the patient's family.  Final Clinical Impressions(s) / ED Diagnoses   Final diagnoses:  None    ED Discharge Orders    None       Varney Biles, MD 07/29/18 [phone]

## 2018-07-29 NOTE — BH Assessment (Signed)
Tele Assessment Note   Patient Name: Vanessa Short MRN: [id number] Referring Physician: Tamera Punt Location of Patient: [facility] Location of Provider: [place]  Illyria Brymer is an 52 y.o. female who presented in the ED with her husband with c/o experiencing hallucinations.  Patient states that she experienced her first hallucinations two years ago when she had a UTI.  She states that after the infection went away so did the hallucinations.  Patient states that she started experiencing hallucinations again while she was at the extended care facility where she was being treated when she had pneumonia and since she has returned home that they have gotten worse.  Patient states that she has never really had a mental health history except for some anxiety.  Patient's hallucinations are more visual, but she is experiencing some tactile disturbances at times.  Patient denies any history of SI/HI and she has never received any mental health treatment on an inpatient or outpatient basis.  Patient states that her PCP prescribes her anxiety/depression medications.  Patient denies any history of SI/HI. Patient denies any SA issues other than an occasional glass of wine. Patient states that she has not been sleeping, but a couple hours per night, but states that her appetite has been good.  TTS spoke with patient's husband Rosmery Duggin) who is in the ED with patient.  He indicated that patient has only struggled with hallucinations after she had an infection.  He stated that patient has no history of mental illness other than her anxiety.  Husband stated that the hallucinations re-occurred he thinks as a result of her pneumonia.  He stated that they started when her O2 level dropped.  He stated that because of her pneumonia that he feels her missing her infusion also complicated the situation. Husband stated that she is not a danger to herself or others and could not hurt herself even if she wanted to  because of her MS complications.  Patient presented as alert and oriented.  Her mood depressed and her affect was flat.  Patient thoughts were organized and her memory was intact.  Patient did not appear to be responding to any internal stimuli.  Her insight, judgment and impulse control did not appear to be impaired. Patient was cooperative with the assessment process.  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 Headache   . History of kidney stones   . Hypertension   . Kidney stones   . Movement disorder   . Multiple sclerosis ([place])   . Neuropathy   . Ulcerative colitis ([place])   . Vision abnormalities     Past Surgical History:  Procedure Laterality Date  . ENDOMETRIAL ABLATION  10/10/2017  . KIDNEY STONE SURGERY    . PORTACATH PLACEMENT N/A 10/16/2017   Procedure: ULTRA SOUND GUIDED INSERTION PORT-A-CATH ERAS PATHWAY;  Surgeon: Kieth Brightly Arta Bruce, MD;  Location: [facility];  Service: General;  Laterality: N/A;    Family History:  Family History  Problem Relation Age of Onset  . Dementia Mother   . Hypertension Father   . Hyperlipidemia Father   . Diabetes Father   . Heart disease Father   . Breast cancer Neg Hx     Social History:  reports that she has never smoked. She has never used smokeless tobacco. She reports current alcohol use. She reports that she does not use drugs.  Additional Social History:  Alcohol / Drug Use Pain Medications:  see MAR Prescriptions: see MAR Over the Counter: see MAR History of alcohol / drug use?: Yes Longest period of sobriety (when/how long): N/A Substance #1 Name of Substance 1: alcohol/patient states that she has an occasional glass of wine  CIWA: CIWA-Ar BP: 130/62 Pulse Rate: 91 COWS:    Allergies:  Allergies  Allergen Reactions  . Oxycodone-Acetaminophen Other (See Comments)    Other  . Risperidone And Related     Became very lethargic  . Penicillins Rash     Has patient had a PCN reaction causing immediate rash, facial/tongue/throat swelling, SOB or lightheadedness with hypotension: NO Has patient had a PCN reaction causing severe rash involving mucus membranes or skin necrosis: NO Has patient had a PCN reaction that required hospitalization NO Has patient had a PCN reaction occurring within the last 10 years:NO If all of the above answers are "NO", then may proceed with Cephalosporin use.    Home Medications: (Not in a hospital admission)   OB/GYN Status:  No LMP recorded. Patient has had an ablation.  General Assessment Data Location of Assessment: [facility] ED TTS Assessment: In system Is this a Tele or Face-to-Face Assessment?: Tele Assessment Is this an Initial Assessment or a Re-assessment for this encounter?: Initial Assessment Patient Accompanied by:: N/A Living Arrangements: Other (Comment)(lives with husband) What gender do you identify as?: Female Marital status: Married Pharmacist, community name: Social worker) Living Arrangements: Spouse/significant other Can pt return to current living arrangement?: Yes Admission Status: Voluntary Is patient capable of signing voluntary admission?: Yes Referral Source: Self/Family/Friend Insurance type: ([place] / [facility])     Crisis Care Plan Living Arrangements: Spouse/significant other Legal Guardian: Other:(self) Name of Psychiatrist: none Name of Therapist: none     Risk to self with the past 6 months Suicidal Ideation: No Has patient been a risk to self within the past 6 months prior to admission? : No Suicidal Intent: No Has patient had any suicidal intent within the past 6 months prior to admission? : No Is patient at risk for suicide?: No Suicidal Plan?: No Has patient had any suicidal plan within the past 6 months prior to admission? : No Access to Means: No What has been your use of drugs/alcohol within the last 12 months?: occasional alcohol use Previous Attempts/Gestures:  No How many times?: 0 Other Self Harm Risks: none Triggers for Past Attempts: None known Intentional Self Injurious Behavior: None Family Suicide History: No Recent stressful life event(s): Other (Comment)(medical issues) Persecutory voices/beliefs?: No Depression: Yes Depression Symptoms: Insomnia, Loss of interest in usual pleasures, Feeling worthless/self pity Substance abuse history and/or treatment for substance abuse?: No Suicide prevention information given to non-admitted patients: Not applicable  Risk to Others within the past 6 months Homicidal Ideation: No-Not Currently/Within Last 6 Months Does patient have any lifetime risk of violence toward others beyond the six months prior to admission? : No Thoughts of Harm to Others: No Current Homicidal Intent: No Current Homicidal Plan: No Access to Homicidal Means: No Identified Victim: (none) History of harm to others?: No Assessment of Violence: None Noted Violent Behavior Description: (none) Does patient have access to weapons?: No Criminal Charges Pending?: No Does patient have a court date: No Is patient on probation?: No  Psychosis Hallucinations: Auditory, Visual Delusions: None noted  Mental Status Report Appearance/Hygiene: Unremarkable Eye Contact: Good Motor Activity: Psychomotor retardation Speech: Soft, Slow Level of Consciousness: Alert Mood: Anxious Affect: Flat Anxiety Level: Severe Thought Processes: Coherent Judgement: Unimpaired Orientation: Person, Place, Time, Situation Obsessive  Compulsive Thoughts/Behaviors: None  Cognitive Functioning Concentration: Normal Memory: Recent Intact, Remote Intact Is patient IDD: No Insight: Fair Impulse Control: Fair Appetite: Good Have you had any weight changes? : No Change Sleep: Decreased Total Hours of Sleep: 3  ADLScreening [facility] Assessment Services) Patient's cognitive ability adequate to safely complete daily activities?: Yes Patient able to  express need for assistance with ADLs?: Yes Independently performs ADLs?: No  Prior Inpatient Therapy Prior Inpatient Therapy: No  Prior Outpatient Therapy Prior Outpatient Therapy: No Does patient have an ACCT team?: No Does patient have Intensive In-House Services?  : No Does patient have [facility] services? : No Does patient have P4CC services?: No  ADL Screening (condition at time of admission) Patient's cognitive ability adequate to safely complete daily activities?: Yes Is the patient deaf or have difficulty hearing?: No Does the patient have difficulty seeing, even when wearing glasses/contacts?: No Does the patient have difficulty concentrating, remembering, or making decisions?: No Patient able to express need for assistance with ADLs?: Yes Does the patient have difficulty dressing or bathing?: Yes Independently performs ADLs?: No Communication: Independent with device (comment) Dressing (OT): Needs assistance Is this a change from baseline?: Pre-admission baseline Grooming: Needs assistance Is this a change from baseline?: Pre-admission baseline Feeding: Independent Bathing: Needs assistance Is this a change from baseline?: Pre-admission baseline Toileting: Needs assistance Is this a change from baseline?: Pre-admission baseline In/Out Bed: Needs assistance Is this a change from baseline?: Pre-admission baseline Walks in Home: Independent with device (comment) Does the patient have difficulty walking or climbing stairs?: No Weakness of Legs: Both Weakness of Arms/Hands: Both  Home Assistive Devices/Equipment Home Assistive Devices/Equipment: Wheelchair  Therapy Consults (therapy consults require a physician order) PT Evaluation Needed: No OT Evalulation Needed: No Abuse/Neglect Assessment (Assessment to be complete while patient is alone) Abuse/Neglect Assessment Can Be Completed: Yes Physical Abuse: Denies Verbal Abuse: Denies Sexual Abuse:  Denies Exploitation of patient/patient's resources: Denies Self-Neglect: Denies Values / Beliefs Cultural Requests During Hospitalization: None Spiritual Requests During Hospitalization: None Consults Spiritual Care Consult Needed: No Social Work Consult Needed: No Regulatory affairs officer (For Healthcare) Does Patient Have a Medical Advance Directive?: No Would patient like information on creating a medical advance directive?: No - Patient declined Nutrition Screen- [facility] Has the patient recently lost weight without trying?: No Has the patient been eating poorly because of a decreased appetite?: No Malnutrition Screening Tool Score: 0        Disposition: Per Elmarie Shiley, NP and Dr Dwyane Dee, patient does not meet inpatient admission criteria.  Patient's hallucinations are most likely attributed to a medical issue and are not common to a schizoid disorder. Disposition Initial Assessment Completed for this Encounter: Yes Patient referred to: (patient does not meet inpatient psychiatric criteria.)  This service was provided via telemedicine using a 2-way, interactive audio and video technology.  Names of all persons participating in this telemedicine service and their role in this encounter. Name: Eryn Marandola Role: patient  Name: Kasandra Knudsen Rivka Baune Role: TTS  Name: Elmarie Shiley Role: FNP  Name:  Role:     Reatha Armour 07/29/2018 12:44 PM

## 2018-07-29 NOTE — H&P (Addendum)
[facility] Admission History and Physical Service Pager: [phone]  Patient name: Vanessa Short Medical record number: [id number] Date of birth: 1966-04-11 Age: 52 y.o. Gender: female  Primary Care Provider: [facility] Consultants: neuro, psych Code Status: full  Chief Complaint: hallucinations  Assessment and Plan: Vanessa Short is a 52 y.o. female presenting with worsening hallucinations for the past month.  Past medical history is significant for progressive MS, quadriparesis, dysesthesia, ulcerative colitis, major depression.   Worsening hallucinations -patient and husband report no psychiatric history before 1 month ago.  In that time she has had worsening auditory, visual, and tactile hallucinations, worst in the evening.   She notes that she does have some mild nasal congestion and cough though she denies fever, dysuria, SOB.  On admission, vitals were all within normal limits with the exception of blood pressure of 151/89.  Admission labs are unremarkable, creatinine 0.97, WBC 10.3, hemoglobin 10.8.  UA with trace leukocytes, negative ketones, moderate hemoglobin, no bacteria.  MR head shows no acute changes with extensive white matter changes consistent with MS.  Differential for hallucinations includes worsening degenerative changes secondary to MS, infection associated psychosis, medication adverse effect, ingestion of illicit substances, psychogenic cause.  Infectious cause is unlikely at this time based on unremarkable vitals, normal WBC and UA/history but does not support UTI/pneumonia/other infectious etiology, ROS did not reveal any obvious sources of infection. Toxicology labs pending.  Medication-induced psychosis was on the differential during her ED visit on 11/10, steroids were discontinued at that time.  Adverse effects of medications may be contributing to the situation.  Progression of her multiple sclerosis  remains highest on the differential at this time.  The primary team will work to rule out medical causes of psychosis while consulting neurology and psychiatry to assess causes of psychosis/hallucinations from their perspectives. -Admit to family medicine teaching service, attending Dr. McDiarmid -Consult neurology -Consult psychiatry -Follow-up UDS, TSH, EtOH - RPR, B12 to  Complete AMS workup -Continue olanzapine (started 12/6 for hallucinations) -Monitor vitals - consider safety sitter if needed for agitation overnight  Mild pyuria -on presentation to the ED, patient did not endorse any symptoms of dysuria, polyuria, suprapubic pain, fever.  UA showed trace leukocytes, no nitrites, moderate hemoglobin, no bacteria.  In the ED, she was treated with vancomycin, cefepime, metronidazole. -Discontinue antibiotics -Continue to monitor symptoms  Multiple sclerosis -She was first diagnosed in 1989.  Since that time she has had a progressive degeneration.  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  In the past year, neurology has started her on Tysabri.  They noted significant improvements following initiation of this medication.  Specifically her right arm regained significant function and she had a motorized chair appropriated with her right hand control.  In the past week or 2 she has lost the regained function of her right hand.  Due to her recent infections and antibiotic use, they had to put off her next infusion of Tysabri.  In office visit note from 05/11/2016 notes that she is on acyclovir for reasons related to her MS. -continue Tysabri (natalizumab) -Continue acyclovir -Consult neurology as noted above  Diastolic heart failure-Echo from 08/08/2016 shows grade 2 diastolic dysfunction with an EF of 55 to 60%.  Her home medications include furosemide 40 mg BID, labetalol 100 mg daily. She appears euvolemic on admission. 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  Her home medications include baclofen, dantrolene. She has 2/5 strength in left upper extremity, 3/5 strength in right upper extremity. 0/5 strength bil LE on admit. -baclofen -dantrolene   Dysesthesia - stable, chronic. secondary to her MS.  Her home medications include oxcarbazepine and the Motrin. -continue oxcarbazepine -continue lamotrigine   Major depressive disorder -she has a history of severe depression.  We did not assess for depressive symptoms during her admissions interview.  Per chart review, she has not experienced psychosis with depression in the past.  Medications include fluoxetine 40 mg daily. -fluoxetine  Ulcerative colitis - chronic, stable  she follows with GI for ulcerative colitis.  Her medications at home currently include mesalamine. No diarrhea or abdominal pain currently. Last BM was yesterday and it was normal. -Mesalamine   HTN -chronic, stable, blood pressure on admission was 151/89.  Medications include labetalol 124m, furosemide 40 mg. - continue labetalol 1035m- furosemide 40 mg  Overactive bladder -possibly related to her MS.  Typical medications due to anticholinergic side effects contributing to her hallucinations. -holding solifenacin -holding Tolterodine  Constipation - managed by her GI doctor.  Her home medication includes amitiza. -Lubiprostone(amitiza)  FEN/GI: Pantoprazole, general diet Prophylaxis: Lovenox  Disposition:  DC pending neuro and psych eval  History of Present Illness:  Vanessa Short a [age].o. female presenting with worsening visual and tactile hallucinations for the past month.  Past medical history is significant for progressive MS, quadriparesis, dysesthesia, ulcerative colitis, major depression.  History was obtained from patient and husband (KLillyn Wieczorek  They noted that her hallucinations started for the first time about 1 month ago  following the diagnosis of urinary tract infection.  Following resolution of this infection, her hallucinations appear to improve.  Shortly after resolution of this UTI, she is diagnosed with pneumonia at an urgent care visit.  In the week following this urgent care visit she had progressively worsened hallucinations which led to an ED visit on 06/23/2008.  During that visit, her steroids were discontinued while her antibiotics were continued.  Since that visit, her husband reports that her hallucinations have been slowly worsening.  In the past week she has had her most severe hallucinations in the evenings. These hallucinations seem to involve site, sound and sensation.  She will sometimes feel someone touching her leg who is not there or making changes to her chair while no one is around.  She is typically aware when she is hallucinating that when she sees/hears/feels is not real.  She and her husband decided to come to the emergency room last night because her hallucinations were becoming increasingly severe. Additionally there have been behavioral disturbances that have made the husband concerned about her safety at home with the hallucinations. He also notes that stress may be contributing to the hallucinations onset because her husband generally takes care of her and he was out of town over the past week.  At baseline, the patient is able to lift objects with her right hand, moves her left hand slightly. She has not had her natalizumab injection (usually q4 weeks, but spaced due to recent abx use per husband and now she has not had the injection in 8 weeks) and since she has been off of the injection her right hand function has decreased.   In the ED, she was found to have a UA with few leukocytes and no bacteria.  She was treated with cefepime, metronidazole and vancomycin.  MRI brain showed no acute  changes with extensive white matter disease consistent with MS.  Neurology and psychiatry were both  consulted in the ED both consults stated the patient did not meet admission criteria.   Review Of Systems: Per HPI with the following additions:   Review of Systems  Constitutional: Negative for fever.  HENT: Positive for congestion. Negative for sore throat.   Respiratory: Positive for cough. Negative for shortness of breath.   Cardiovascular: Negative for chest pain and palpitations.  Gastrointestinal: Negative for nausea and vomiting.  Genitourinary: Negative for dysuria.  Neurological: Positive for tremors (normal at baseline). Negative for headaches.  Psychiatric/Behavioral: Positive for hallucinations. The patient is nervous/anxious.     Patient Active Problem List   Diagnosis Date Noted  . Altered mental status 07/29/2018  . Wheelchair bound 02/27/2017  . Chronic diastolic CHF (congestive heart failure) ([place]) 09/30/2016  . Prolonged QT interval   . High risk medication use 09/27/2016  . MDD (major depressive disorder), recurrent severe, without psychosis ([place]) 08/16/2016  . Altered mental state   . CAP (community acquired pneumonia) 08/07/2016  . Left lower lobe pneumonia ([place]) 08/05/2016  . Normocytic anemia 08/05/2016  . Slurred speech 08/05/2016  . Ulcerative colitis ([place]) 08/05/2016  . Essential hypertension 08/05/2016  . Subacromial bursitis 05/09/2016  . Visual hallucination 03/14/2016  . Urinary disorder 11/09/2015  . Hand weakness 01/06/2015  . Multiple sclerosis ([place]) 09/07/2014  . Spastic quadriparesis ([place]) 09/07/2014  . Dysesthesia 09/07/2014  . Depression 09/07/2014    Past Medical History: Past Medical History:  Diagnosis Date  . Headache   . History of kidney stones   . Hypertension   . Kidney stones   . Movement disorder   . Multiple sclerosis ([place])   . Neuropathy   . Ulcerative colitis ([place])   . Vision abnormalities     Past Surgical History: Past Surgical History:  Procedure Laterality Date  . ENDOMETRIAL ABLATION  10/10/2017  .  KIDNEY STONE SURGERY    . PORTACATH PLACEMENT N/A 10/16/2017   Procedure: ULTRA SOUND GUIDED INSERTION PORT-A-CATH ERAS PATHWAY;  Surgeon: Kieth Brightly Arta Bruce, MD;  Location: [facility] ORS;  Service: General;  Laterality: N/A;    Social History: Social History   Tobacco Use  . Smoking status: Never Smoker  . Smokeless tobacco: Never Used  Substance Use Topics  . Alcohol use: Yes    Comment: occasional glass of wine  . Drug use: No   Additional social history: She now lives at home with her husband and has a specially outfitted chair for sleeping. Please also refer to relevant sections of EMR.  Family History: Family History  Problem Relation Age of Onset  . Dementia Mother   . Hypertension Father   . Hyperlipidemia Father   . Diabetes Father   . Heart disease Father   . Breast cancer Neg Hx    No known family history of psychiatric disorders.  Multiple people in the family with Alzheimer's.  Allergies and Medications: Allergies  Allergen Reactions  . Oxycodone-Acetaminophen Other (See Comments)    Other  . Risperidone And Related     Became very lethargic  . Penicillins Rash    Has patient had a PCN reaction causing immediate rash, facial/tongue/throat swelling, SOB or lightheadedness with hypotension: NO Has patient had a PCN reaction causing severe rash involving mucus membranes or skin necrosis: NO Has patient had a PCN reaction that required hospitalization NO Has patient had a PCN reaction occurring within the last 10 years:NO If all of  the above answers are "NO", then may proceed with Cephalosporin use.   No current facility-administered medications on file prior to encounter.    Current Outpatient Medications on File Prior to Encounter  Medication Sig Dispense Refill  . acyclovir (ZOVIRAX) 400 MG tablet Take 400 mg by mouth daily. Take every day per patient  0  . AMITIZA 8 MCG capsule Take 8 mcg by mouth 2 (two) times daily.  1  . aspirin EC 81 MG tablet Take 81 mg  by mouth daily.    Marland Kitchen b complex vitamins tablet Take 1 tablet by mouth daily.     . baclofen (LIORESAL) 20 MG tablet TAKE 1 TABLET UP TO 4 TIMES DAILY (Patient taking differently: Take 20 mg by mouth 4 (four) times daily. ) 360 tablet 4  . cholecalciferol (VITAMIN D) 1000 UNITS tablet Take 1,000 Units by mouth daily.     . dantrolene (DANTRIUM) 50 MG capsule TAKE 1 CAPSULE (50 MG TOTAL) BY MOUTH 3 (THREE) TIMES DAILY. 270 capsule 3  . FLUoxetine (PROZAC) 40 MG capsule Take 1 capsule (40 mg total) by mouth daily. 90 capsule 3  . folic acid (FOLVITE) 500 MCG tablet Take 400 mcg by mouth daily.     . furosemide (LASIX) 40 MG tablet Take 1 tablet (40 mg total) by mouth 2 (two) times daily. 30 tablet   . ibuprofen (ADVIL,MOTRIN) 200 MG tablet Take 200 mg by mouth 3 (three) times daily.     Marland Kitchen labetalol (NORMODYNE) 100 MG tablet Take 100 mg by mouth daily.    Marland Kitchen lamoTRIgine (LAMICTAL) 200 MG tablet Take 200 mg by mouth 3 (three) times daily.    . mesalamine (PENTASA) 500 MG CR capsule Take 1,000 mg by mouth 3 (three) times daily.     . Multiple Vitamin (MULTIVITAMIN) tablet Take 1 tablet by mouth daily.    . natalizumab (TYSABRI) 300 MG/15ML injection Inject 300 mg into the vein every 6 (six) weeks.     Marland Kitchen OLANZapine (ZYPREXA) 5 MG tablet Take 5 mg by mouth at bedtime.    Marland Kitchen omeprazole (PRILOSEC) 40 MG capsule Take 40 mg by mouth 2 (two) times daily.    . OXcarbazepine (TRILEPTAL) 150 MG tablet Take 1 tablet (150 mg total) by mouth 3 (three) times daily. 270 tablet 3  . solifenacin (VESICARE) 10 MG tablet Take 10 mg by mouth daily.     Marland Kitchen tolterodine (DETROL) 2 MG tablet Take 2 mg by mouth every evening.  1  . lidocaine-prilocaine (EMLA) cream Apply one inch before port-a cath access prn (Patient not taking: Reported on 07/29/2018) 30 g 3  . sodium chloride (OCEAN) 0.65 % SOLN nasal spray Place 1 spray into both nostrils as needed for congestion. (Patient not taking: Reported on 07/29/2018)  0     Objective: BP (!) 183/89 (BP Location: Right Arm)   Pulse 87   Temp 98.8 F (37.1 C) (Oral)   Resp 17   SpO2 99%  Exam: Physical Exam Constitutional:      General: She is not in acute distress.    Appearance: Normal appearance. She is not ill-appearing, toxic-appearing or diaphoretic.  Eyes:     Extraocular Movements: Extraocular movements intact.     Conjunctiva/sclera: Conjunctivae normal.     Pupils: Pupils are equal, round, and reactive to light.  Cardiovascular:     Rate and Rhythm: Normal rate and regular rhythm.     Heart sounds: Normal heart sounds. No murmur. No friction rub. No  gallop.   Pulmonary:     Effort: Pulmonary effort is normal.     Breath sounds: Normal breath sounds.     Comments: To anterior auscultation Abdominal:     Palpations: Abdomen is soft.  Skin:    General: Skin is warm and dry.     Coloration: Skin is not jaundiced or pale.  Neurological:     General: No focal deficit present.     Mental Status: She is alert and oriented to person, place, and time. Mental status is at baseline.     GCS: GCS eye subscore is 4. GCS verbal subscore is 5. GCS motor subscore is 6.     Cranial Nerves: No cranial nerve deficit.     Sensory: No sensory deficit.     Motor: Weakness present.     Comments: 3/5 strength in right upper extremity, 2/5 strength in left upper extremity.  0/5 strength in lower extremities bilaterally.  Sensation to light touch intact  Psychiatric:        Attention and Perception: Attention and perception normal.        Mood and Affect: Mood and affect normal.        Speech: Speech normal.        Behavior: Behavior normal. Behavior is cooperative.        Thought Content: Thought content normal.        Cognition and Memory: Cognition and memory normal.        Judgment: Judgment normal.      Labs and Imaging: CBC BMET  Recent Labs  Lab 07/29/18 0630  WBC 10.3  HGB 10.8*  HCT 35.8*  PLT 212   Recent Labs  Lab 07/29/18 0630   NA 142  K 4.2  CL 103  CO2 29  BUN 27*  CREATININE 0.97  GLUCOSE 101*  CALCIUM 9.7     Mr Brain W And Wo Contrast  Result Date: 07/29/2018  IMPRESSION: Unchanged extensive white matter disease consistent with chronic multiple sclerosis. No evidence of active demyelination or other acute intracranial abnormality. Electronically Signed   By: Logan Bores M.D.   On: 07/29/2018 10:28   Everrett Coombe, MD 07/29/2018, 6:14 PM PGY-1, [place] Intern pager: [phone], text pages welcome   I have separately seen and examined the patient. I have discussed the findings and exam with Dr. Pilar Plate and agree with the above note.  My changes/additions are outlined in BLUE.   Everrett Coombe, MD PGY-3 [facility]

## 2018-07-29 NOTE — ED Provider Notes (Signed)
Patient is a 52 year old female who was seen by Dr. Kathrynn Humble.  She has MS and is been having increased hallucinations which has happened previously with UTIs.  Her urine today is equivocal for infection.  Her MRI shows no acute change in her MS. she had fairly significant hallucinations and paranoia over the last 2 nights and has not been getting any sleep.  I spoke with psychiatry who did an assessment and feels that this is a medical issue rather than psychiatric and they did not have any input.  I spoke with Dr. 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  He feels that psychiatry needs to have been put into her medication management.  She was started on Zyprexa about a week ago for the symptoms but it has not been helping.  Given this and the fact that she is decompensated so much, I did discuss her situation with family medicine who will admit the patient for further stabilization of her psychotic symptoms.   Malvin Johns, MD 07/29/18 [phone]

## 2018-07-29 NOTE — ED Notes (Signed)
Pt states she has a port, refused phlebotomy blood draw.

## 2018-07-29 NOTE — Progress Notes (Signed)
Patient with longstanding history of MS, depression presents with worsening hallucinations x 1 month.  MRI brain obtained shows stable MS compared to prior scan. Infectious and metabolic workup negative so far.  I suspect hallucinations likely from MS related depression and psychosis. Patient already on antipsychotic and Trileptal ( started for dysesthesia) which is also a mood stabilizer.I do not have further recommendations from a neurological workup and patient would benefit from psychiatry input in addressing her behaviourial symptoms of MS.

## 2018-07-29 NOTE — ED Notes (Signed)
Pt denies SI/HI

## 2018-07-29 NOTE — ED Triage Notes (Signed)
Pt states she has been having hallucinations, recent pneumonia diagnosis/treatment. Hx MS, pt currently taking meds for her hallucinations, but just started last week. Husband reports that pt's LFTs have been elevated and gallstones.

## 2018-07-30 ENCOUNTER — Other Ambulatory Visit: Payer: Self-pay

## 2018-07-30 DIAGNOSIS — R768 Other specified abnormal immunological findings in serum: Secondary | ICD-10-CM | POA: Insufficient documentation

## 2018-07-30 DIAGNOSIS — R443 Hallucinations, unspecified: Secondary | ICD-10-CM | POA: Diagnosis present

## 2018-07-30 DIAGNOSIS — F940 Selective mutism: Secondary | ICD-10-CM | POA: Insufficient documentation

## 2018-07-30 DIAGNOSIS — R440 Auditory hallucinations: Secondary | ICD-10-CM | POA: Diagnosis present

## 2018-07-30 DIAGNOSIS — R441 Visual hallucinations: Secondary | ICD-10-CM

## 2018-07-30 DIAGNOSIS — F332 Major depressive disorder, recurrent severe without psychotic features: Secondary | ICD-10-CM

## 2018-07-30 DIAGNOSIS — R442 Other hallucinations: Secondary | ICD-10-CM | POA: Diagnosis present

## 2018-07-30 DIAGNOSIS — R208 Other disturbances of skin sensation: Secondary | ICD-10-CM

## 2018-07-30 LAB — URINE CULTURE: Culture: NO GROWTH

## 2018-07-30 LAB — CBC
HCT: 32.8 % — ABNORMAL LOW (ref 36.0–46.0)
Hemoglobin: 9.8 g/dL — ABNORMAL LOW (ref 12.0–15.0)
MCH: 30.9 pg (ref 26.0–34.0)
MCHC: 29.9 g/dL — ABNORMAL LOW (ref 30.0–36.0)
MCV: 103.5 fL — ABNORMAL HIGH (ref 80.0–100.0)
Platelets: 189 10*3/uL (ref 150–400)
RBC: 3.17 MIL/uL — ABNORMAL LOW (ref 3.87–5.11)
RDW: 16 % — ABNORMAL HIGH (ref 11.5–15.5)
WBC: 8.2 10*3/uL (ref 4.0–10.5)
nRBC: 0.2 % (ref 0.0–0.2)

## 2018-07-30 LAB — COMPREHENSIVE METABOLIC PANEL
ALT: 23 U/L (ref 0–44)
AST: 19 U/L (ref 15–41)
Albumin: 3.3 g/dL — ABNORMAL LOW (ref 3.5–5.0)
Alkaline Phosphatase: 79 U/L (ref 38–126)
Anion gap: 12 (ref 5–15)
BUN: 21 mg/dL — ABNORMAL HIGH (ref 6–20)
CO2: 28 mmol/L (ref 22–32)
Calcium: 9.3 mg/dL (ref 8.9–10.3)
Chloride: 103 mmol/L (ref 98–111)
Creatinine, Ser: 0.9 mg/dL (ref 0.44–1.00)
GFR calc Af Amer: >60 mL/min (ref >60)
GFR calc non Af Amer: >60 mL/min (ref >60)
GLUCOSE: 94 mg/dL (ref 70–99)
Potassium: 3.5 mmol/L (ref 3.5–5.1)
Sodium: 143 mmol/L (ref 135–145)
TOTAL PROTEIN: 5.6 g/dL — AB (ref 6.5–8.1)
Total Bilirubin: 0.7 mg/dL (ref 0.3–1.2)

## 2018-07-30 LAB — HIV ANTIBODY (ROUTINE TESTING W REFLEX): HIV Screen 4th Generation wRfx: NONREACTIVE

## 2018-07-30 LAB — RPR: RPR Ser Ql: NONREACTIVE

## 2018-07-30 MED ORDER — SODIUM CHLORIDE 0.9% FLUSH
10.0000 mL | INTRAVENOUS | Status: DC | PRN
  Administered 2018-07-31: 20 mL
  Administered 2018-07-31: 10 mL
  Filled 2018-07-30 (×2): qty 40

## 2018-07-30 MED ORDER — FLUOXETINE HCL 20 MG PO CAPS
50.0000 mg | ORAL_CAPSULE | Freq: Every day | ORAL | Status: DC
  Administered 2018-07-31: 50 mg via ORAL
  Filled 2018-07-30: qty 1

## 2018-07-30 MED ORDER — INFLUENZA VAC SPLIT QUAD 0.5 ML IM SUSY
0.5000 mL | PREFILLED_SYRINGE | INTRAMUSCULAR | Status: AC
  Administered 2018-07-31: 0.5 mL via INTRAMUSCULAR
  Filled 2018-07-30: qty 0.5

## 2018-07-30 MED ORDER — QUETIAPINE FUMARATE 25 MG PO TABS
25.0000 mg | ORAL_TABLET | Freq: Every day | ORAL | Status: DC
  Administered 2018-07-30: 25 mg via ORAL
  Filled 2018-07-30: qty 1

## 2018-07-30 MED ORDER — PNEUMOCOCCAL VAC POLYVALENT 25 MCG/0.5ML IJ INJ
0.5000 mL | INJECTION | INTRAMUSCULAR | Status: AC
  Administered 2018-07-31: 0.5 mL via INTRAMUSCULAR
  Filled 2018-07-30: qty 0.5

## 2018-07-30 NOTE — Progress Notes (Signed)
CSW received consult regarding psych outpatient resources. Follow up information placed on patient's AVS.   Cedric Fishman LCSW [phone]

## 2018-07-30 NOTE — Progress Notes (Addendum)
Family Medicine Teaching Service Daily Progress Note Intern Pager: [phone]  Patient name: Vanessa Short Medical record number: [id number] Date of birth: 08/18/1965 Age: 52 y.o. Gender: female  [facility]: [facility] Consultants: Neuro, Psychiatry Code Status: full  Pt Overview and Major Events to Date:  12/16 - admitted for worsening hallucination with unsafe dispo plan  Assessment and Plan: Vanessa Short is a 52 y.o. female presenting with worsening hallucinations for the past month.  Past medical history is significant for progressive MS, quadriparesis, dysesthesia, ulcerative colitis, major depression.   Worsening hallucinations - TSH: 7.9, B12 641, RPR non reactive, EtOh <10, UDS negative.  Neurology notes that this is likely related to progression of her MS and it would be most beneficial to have psychiatry recs for better management of psychosis. -f/u T4 -Consult psychiatry -Continue olanzapine (started 12/6 for hallucinations)  Mild pyuria -s/p vancomycin, cefepime, metronidazole in ED. -Discontinue antibiotics -Continue to monitor symptoms  Multiple sclerosis - -continue Tysabri (natalizumab) -Continue acyclovir  Diastolic heart failure-not currently endorsing any sxs of CHF. Will caontinue home meds. -Continue labetalol 100 mg -Continue furosemide 40 mg twice daily  Spastic quadriparesis -Continue home medications. -baclofen -dantrolene   Dysesthesia - stable, chronic. secondary to her MS.  Her home medications include oxcarbazepine and the Motrin. -continue oxcarbazepine -continue lamotrigine   Major depressive disorder -she has a history of severe depression.  We did not assess for depressive symptoms during her admissions interview.  Per chart review, she has not experienced psychosis with depression in the past.  Medications include fluoxetine 40 mg daily. -fluoxetine  Ulcerative colitis - chronic, stable   she follows with GI for ulcerative colitis.  Her medications at home currently include mesalamine. No diarrhea or abdominal pain currently. Last BM was yesterday and it was normal. -Mesalamine   HTN -chronic, stable, blood pressure on admission was 151/89.  Medications include labetalol 173m, furosemide 40 mg. - continue labetalol 1055m- furosemide 40 mg  Overactive bladder -  holding medications due to anticholinergic side effects contributing to her hallucinations. -holding solifenacin -holding Tolterodine  Constipation - managed by her GI doctor.  Her home medication includes amitiza. -Lubiprostone(amitiza)  FEN/GI: Pantoprazole, general diet Prophylaxis: Lovenox  Disposition: DC pending workup for hallucinations  Subjective:  She reports a difficult night of sleep.  She believes that she continue to experience hallucinations overnight.  She denied hallucinations this morning.  No additional complaints this morning.  Objective: Temp:  [98 F (36.7 C)-98.4 F (36.9 C)] 98.4 F (36.9 C) (12/17 0258) Pulse Rate:  [70-91] 83 (12/17 0258) Resp:  [16-18] 18 (12/16 2201) BP: (116-183)/(58-89) 128/61 (12/17 0258) SpO2:  [99 %-100 %] 100 % (12/17 0258)  Physical Exam: General: Alert and cooperative and appears to be in no acute distress Cardio: Normal A1 and S2, no S3 or S4. Rhythm is regular. No murmurs or rubs.   Pulm: Clear to auscultation bilaterally anteriorly, no crackles, wheezing, or diminished breath sounds. Normal respiratory effort Abdomen: Bowel sounds normal. Abdomen soft and non-tender.  Extremities: No peripheral edema. Warm/ well perfused.  Strong radial and pedal pulses. Neuro: Cranial nerves grossly intact. Quadriparesis, with minimal use of upper and lower extremities.  Laboratory: Recent Labs  Lab 07/29/18 0630 07/30/18 0550  WBC 10.3 8.2  HGB 10.8* 9.8*  HCT 35.8* 32.8*  PLT 212 189   Recent Labs  Lab 07/29/18 0630  NA 142  K 4.2  CL 103   CO2 29  BUN 27*  CREATININE 0.97  CALCIUM 9.7  PROT 6.8  BILITOT 0.5  ALKPHOS 91  ALT 24  AST 21  GLUCOSE 101*   Imaging/Diagnostic Tests: No new imaging  Matilde Haymaker, MD 07/30/2018, 6:42 AM PGY-1, [place] Intern pager: [phone], text pages welcome

## 2018-07-30 NOTE — Consult Note (Addendum)
[facility] Face-to-Face Psychiatry Consult   Reason for Consult:  Hallucinations  Referring Physician:  Dr. McDiarmid  Patient Identification: Vanessa Short MRN:  [id number] Principal Diagnosis: Hallucinations Diagnosis:  Principal Problem:   Hallucination, visual Active Problems:   Multiple sclerosis (HCC)   Spastic quadriparesis (HCC)   Hypertension   Altered mental status   JC virus antibody positive   Auditory hallucinations   Tactile hallucinations   Total Time spent with patient: 1 hour  Subjective:   Vanessa Short is a 52 y.o. female patient admitted with worsening hallucinations in the setting of progressive multiple sclerosis.  HPI:   Per chart review, patient was admitted with worsening hallucinations over the past month in the setting of progressive multiple sclerosis. Medical history is also significant for quadriparesis and dysesthesia. She has a history of MDD. Neurology was consulted and feel like her worsening hallucinations are due to progression of her MS. Psychiatry was consulted for management of hallucinations. She is prescribed Zyprexa 5 mg qhs and Prozac 40 mg daily. She is also prescribed Trileptal 150 mg TID. TSH is 7.993 and T4 is 5. BAL and UDS were negative on admission. Brain MRI is unremarkable for active demyelination or other acute intracranial abnormality.  On interview, Vanessa Short reports poorly managed anxiety and depression. She is prescribed Prozac. It was started a year ago and she does not believe that she has had recent medication adjustments. She reports weight gain but is unsure how much. She reports worsening hallucinations over the past month. She also reports perceptual disturbances which she attributes to double vision. She reports intermittent hallucinations on a weekly basis. She reports seeing her son hanging on to the [place] and pushing in the window on her drive to the hospital. She also reports AH where she sometimes hears the home alarm system  when it is not making any sound. She reports that her hallucinations can be distressing. She took Zyprexa for 1-2 days but believes that it exacerbated her hallucinations. She has not tried other antipsychotic medications. Her medications are managed by her PCP. She denies SI or HI.   Past Psychiatric History: MDD  Risk to Self: Suicidal Ideation: No Suicidal Intent: No Is patient at risk for suicide?: No Suicidal Plan?: No Access to Means: No What has been your use of drugs/alcohol within the last 12 months?: occasional alcohol use How many times?: 0 Other Self Harm Risks: none Triggers for Past Attempts: None known Intentional Self Injurious Behavior: None Risk to Others: Homicidal Ideation: No-Not Currently/Within Last 6 Months Thoughts of Harm to Others: No Current Homicidal Intent: No Current Homicidal Plan: No Access to Homicidal Means: No Identified Victim: (none) History of harm to others?: No Assessment of Violence: None Noted Violent Behavior Description: (none) Does patient have access to weapons?: No Criminal Charges Pending?: No Does patient have a court date: No Prior Inpatient Therapy: Prior Inpatient Therapy: No Prior Outpatient Therapy: Prior Outpatient Therapy: No Does patient have an ACCT team?: No Does patient have Intensive In-House Services?  : No Does patient have [facility] services? : No Does patient have P4CC services?: No  Past Medical History:  Past Medical History:  Diagnosis Date  . Headache   . History of kidney stones   . Hypertension   . Kidney stones   . Movement disorder   . Multiple sclerosis ([place])   . Neuropathy   . Ulcerative colitis ([place])   . Vision abnormalities     Past Surgical History:  Procedure Laterality Date  .  ENDOMETRIAL ABLATION  10/10/2017  . KIDNEY STONE SURGERY    . PORTACATH PLACEMENT N/A 10/16/2017   Procedure: ULTRA SOUND GUIDED INSERTION PORT-A-CATH ERAS PATHWAY;  Surgeon: Kieth Brightly Arta Bruce, MD;  Location: [facility]  ORS;  Service: General;  Laterality: N/A;   Family History:  Family History  Problem Relation Age of Onset  . Dementia Mother   . Hypertension Father   . Hyperlipidemia Father   . Diabetes Father   . Heart disease Father   . Breast cancer Neg Hx    Family Psychiatric  History: Mother-dementia and maternal grandmother-dementia.   Social History:  Social History   Substance and Sexual Activity  Alcohol Use Yes   Comment: occasional glass of wine     Social History   Substance and Sexual Activity  Drug Use No    Social History   Socioeconomic History  . Marital status: Married    Spouse name: Not on file  . Number of children: Not on file  . Years of education: Not on file  . Highest education level: Not on file  Occupational History  . Not on file  Social Needs  . Financial resource strain: Not on file  . Food insecurity:    Worry: Not on file    Inability: Not on file  . Transportation needs:    Medical: Not on file    Non-medical: Not on file  Tobacco Use  . Smoking status: Never Smoker  . Smokeless tobacco: Never Used  Substance and Sexual Activity  . Alcohol use: Yes    Comment: occasional glass of wine  . Drug use: No  . Sexual activity: Not on file  Lifestyle  . Physical activity:    Days per week: Not on file    Minutes per session: Not on file  . Stress: Not on file  Relationships  . Social connections:    Talks on phone: Not on file    Gets together: Not on file    Attends religious service: Not on file    Active member of club or organization: Not on file    Attends meetings of clubs or organizations: Not on file    Relationship status: Not on file  Other Topics Concern  . Not on file  Social History Narrative  . Not on file   Additional Social History: She lives at home with her husband. They have 2 adult children (17 y/o daughter and 65 y/o son). They do not live at home. She reports rare alcohol use and denies illicit substance use.      Allergies:   Allergies  Allergen Reactions  . Oxycodone-Acetaminophen Other (See Comments)    Other  . Risperidone And Related     Became very lethargic  . Penicillins Rash    Has patient had a PCN reaction causing immediate rash, facial/tongue/throat swelling, SOB or lightheadedness with hypotension: NO Has patient had a PCN reaction causing severe rash involving mucus membranes or skin necrosis: NO Has patient had a PCN reaction that required hospitalization NO Has patient had a PCN reaction occurring within the last 10 years:NO If all of the above answers are "NO", then may proceed with Cephalosporin use.    Labs:  Results for orders placed or performed during the hospital encounter of 07/29/18 (from the past 48 hour(s))  Urine culture     Status: None   Collection Time: 07/29/18  5:31 AM  Result Value Ref Range   Specimen Description URINE, RANDOM  Special Requests NONE    Culture      NO GROWTH Performed at [facility], [place] [address], [place], [place] [address]    Report Status 07/30/2018 FINAL   Comprehensive metabolic panel     Status: Abnormal   Collection Time: 07/29/18  6:30 AM  Result Value Ref Range   Sodium 142 135 - 145 mmol/L   Potassium 4.2 3.5 - 5.1 mmol/L   Chloride 103 98 - 111 mmol/L   CO2 29 22 - 32 mmol/L   Glucose, Bld 101 (H) 70 - 99 mg/dL   BUN 27 (H) 6 - 20 mg/dL   Creatinine, Ser 0.97 0.44 - 1.00 mg/dL   Calcium 9.7 8.9 - 10.3 mg/dL   Total Protein 6.8 6.5 - 8.1 g/dL   Albumin 3.5 3.5 - 5.0 g/dL   AST 21 15 - 41 U/L   ALT 24 0 - 44 U/L   Alkaline Phosphatase 91 38 - 126 U/L   Total Bilirubin 0.5 0.3 - 1.2 mg/dL   GFR calc non Af Amer >60 >60 mL/min   GFR calc Af Amer >60 >60 mL/min   Anion gap 10 5 - 15    Comment: Performed at [facility], [address], [place], [place] [address]  cbc     Status: Abnormal   Collection Time: 07/29/18  6:30 AM  Result Value Ref Range   WBC 10.3 4.0 - 10.5 K/uL   RBC 3.41 (L) 3.87 -  5.11 MIL/uL   Hemoglobin 10.8 (L) 12.0 - 15.0 g/dL   HCT 35.8 (L) 36.0 - 46.0 %   MCV 105.0 (H) 80.0 - 100.0 fL   MCH 31.7 26.0 - 34.0 pg   MCHC 30.2 30.0 - 36.0 g/dL   RDW 16.2 (H) 11.5 - 15.5 %   Platelets 212 150 - 400 K/uL   nRBC 0.4 (H) 0.0 - 0.2 %    Comment: Performed at [facility] [address], [place], [place] [address]  Urinalysis, Routine w reflex microscopic     Status: Abnormal   Collection Time: 07/29/18  6:52 AM  Result Value Ref Range   Color, Urine YELLOW YELLOW   APPearance HAZY (A) CLEAR   Specific Gravity, Urine 1.017 1.005 - 1.030   pH 6.0 5.0 - 8.0   Glucose, UA NEGATIVE NEGATIVE mg/dL   Hgb urine dipstick MODERATE (A) NEGATIVE   Bilirubin Urine NEGATIVE NEGATIVE   Ketones, ur NEGATIVE NEGATIVE mg/dL   Protein, ur NEGATIVE NEGATIVE mg/dL   Nitrite NEGATIVE NEGATIVE   Leukocytes, UA TRACE (A) NEGATIVE   RBC / HPF >50 (H) 0 - 5 RBC/hpf   WBC, UA 11-20 0 - 5 WBC/hpf   Bacteria, UA NONE SEEN NONE SEEN   Squamous Epithelial / LPF 0-5 0 - 5   Hyaline Casts, UA PRESENT     Comment: Performed at [facility], [place] [address], [place], [place] [address]  Ethanol     Status: None   Collection Time: 07/29/18  4:22 PM  Result Value Ref Range   Alcohol, Ethyl (B) <10 <10 mg/dL    Comment: (NOTE) Lowest detectable limit for serum alcohol is 10 mg/dL. For medical purposes only. Performed at [facility], [place] [address] [place], [place] [address]   HIV antibody (Routine Testing)     Status: None   Collection Time: 07/29/18  5:05 PM  Result Value Ref Range   HIV Screen 4th Generation wRfx  Non Reactive Non Reactive    Comment: (NOTE) Performed At: [facility] [place], [place] [id number] Rush Farmer MD QQ:[id number]   RPR     Status: None   Collection Time: 07/29/18  5:05 PM  Result Value Ref Range   RPR Ser Ql Non Reactive Non Reactive    Comment: (NOTE) Performed At: [facility] [place], [place] [id number] Rush Farmer MD IP:[id number]   Vitamin B12     Status: None   Collection Time: 07/29/18  5:05 PM  Result Value Ref Range   Vitamin B-12 641 180 - 914 pg/mL    Comment: (NOTE) This assay is not validated for testing neonatal or myeloproliferative syndrome specimens for Vitamin B12 levels. Performed at [facility], [place] [address] [place], [place] [address]   TSH     Status: Abnormal   Collection Time: 07/29/18  5:20 PM  Result Value Ref Range   TSH 7.993 (H) 0.350 - 4.500 uIU/mL    Comment: Performed by a 3rd Generation assay with a functional sensitivity of <=0.01 uIU/mL. Performed at [facility], [place] [address] [place], [place] [address]   Urine rapid drug screen (hosp performed)     Status: None   Collection Time: 07/29/18  9:00 PM  Result Value Ref Range   Opiates NONE DETECTED NONE DETECTED   Cocaine NONE DETECTED NONE DETECTED   Benzodiazepines NONE DETECTED NONE DETECTED   Amphetamines NONE DETECTED NONE DETECTED   Tetrahydrocannabinol NONE DETECTED NONE DETECTED   Barbiturates NONE DETECTED NONE DETECTED    Comment: (NOTE) DRUG SCREEN FOR MEDICAL PURPOSES ONLY.  IF CONFIRMATION IS NEEDED FOR ANY PURPOSE, NOTIFY LAB WITHIN 5 DAYS. LOWEST DETECTABLE LIMITS FOR URINE DRUG SCREEN Drug Class                     Cutoff (ng/mL) Amphetamine and metabolites    1000 Barbiturate and metabolites    200 Benzodiazepine                 409 Tricyclics and metabolites     300 Opiates and metabolites        300 Cocaine and metabolites        300 THC                            50 Performed at [facility], [place] [address] [place], [place] [address]   Comprehensive metabolic panel     Status: Abnormal   Collection Time: 07/30/18  5:50 AM  Result Value Ref Range   Sodium 143 135 - 145 mmol/L   Potassium 3.5 3.5 - 5.1 mmol/L   Chloride 103 98 - 111 mmol/L   CO2 28 22 - 32 mmol/L   Glucose, Bld 94 70 - 99 mg/dL   BUN 21 (H) 6 -  20 mg/dL   Creatinine, Ser 0.90 0.44 - 1.00 mg/dL   Calcium 9.3 8.9 - 10.3 mg/dL   Total Protein 5.6 (L) 6.5 - 8.1 g/dL   Albumin 3.3 (L) 3.5 - 5.0 g/dL   AST 19 15 - 41 U/L   ALT 23 0 - 44 U/L   Alkaline Phosphatase 79 38 - 126 U/L   Total Bilirubin 0.7 0.3 - 1.2 mg/dL   GFR calc non Af Amer >60 >60 mL/min   GFR calc Af Amer >60 >60 mL/min   Anion gap 12  5 - 15    Comment: Performed at [facility], [place] [address] [place], [place] [address]  CBC     Status: Abnormal   Collection Time: 07/30/18  5:50 AM  Result Value Ref Range   WBC 8.2 4.0 - 10.5 K/uL   RBC 3.17 (L) 3.87 - 5.11 MIL/uL   Hemoglobin 9.8 (L) 12.0 - 15.0 g/dL   HCT 32.8 (L) 36.0 - 46.0 %   MCV 103.5 (H) 80.0 - 100.0 fL   MCH 30.9 26.0 - 34.0 pg   MCHC 29.9 (L) 30.0 - 36.0 g/dL   RDW 16.0 (H) 11.5 - 15.5 %   Platelets 189 150 - 400 K/uL   nRBC 0.2 0.0 - 0.2 %    Comment: Performed at [facility], [place] [address], [place], [place] [address]    Current Facility-Administered Medications  Medication Dose Route Frequency Provider Last Rate Last Dose  . acyclovir (ZOVIRAX) tablet 400 mg  400 mg Oral Daily Everrett Coombe, MD   400 mg at 07/30/18 1030  . aspirin EC tablet 81 mg  81 mg Oral Daily Everrett Coombe, MD   81 mg at 07/30/18 1025  . baclofen (LIORESAL) tablet 20 mg  20 mg Oral QID Everrett Coombe, MD   20 mg at 07/30/18 1025  . enoxaparin (LOVENOX) injection 40 mg  40 mg Subcutaneous Q24H Everrett Coombe, MD   40 mg at 07/30/18 1025  . FLUoxetine (PROZAC) capsule 40 mg  40 mg Oral Daily Everrett Coombe, MD   40 mg at 07/30/18 1025  . furosemide (LASIX) tablet 40 mg  40 mg Oral BID Everrett Coombe, MD   40 mg at 07/30/18 1025  . ibuprofen (ADVIL,MOTRIN) tablet 200 mg  200 mg Oral TID Everrett Coombe, MD   200 mg at 07/30/18 1025  . [START ON 07/31/2018] Influenza vac split quadrivalent PF (FLUARIX) injection 0.5 mL  0.5 mL Intramuscular Tomorrow-1000 McDiarmid, Blane Ohara, MD      . labetalol (NORMODYNE) tablet 100 mg  100  mg Oral Daily Everrett Coombe, MD   100 mg at 07/30/18 1025  . lubiprostone (AMITIZA) capsule 8 mcg  8 mcg Oral BID Everrett Coombe, MD   8 mcg at 07/30/18 1030  . mesalamine (PENTASA) CR capsule 1,000 mg  1,000 mg Oral TID Everrett Coombe, MD   1,000 mg at 07/30/18 1030  . OLANZapine (ZYPREXA) tablet 5 mg  5 mg Oral QHS Everrett Coombe, MD   5 mg at 07/29/18 2204  . OXcarbazepine (TRILEPTAL) tablet 150 mg  150 mg Oral TID Everrett Coombe, MD   150 mg at 07/30/18 1030  . pantoprazole (PROTONIX) EC tablet 40 mg  40 mg Oral Daily Everrett Coombe, MD   40 mg at 07/30/18 1024  . [START ON 07/31/2018] pneumococcal 23 valent vaccine (PNU-IMMUNE) injection 0.5 mL  0.5 mL Intramuscular Tomorrow-1000 McDiarmid, Blane Ohara, MD      . sodium chloride flush (NS) 0.9 % injection 10-40 mL  10-40 mL Intracatheter PRN McDiarmid, Blane Ohara, MD        Musculoskeletal: Strength & Muscle Tone: Patient has quadriparesis.  Gait & Station: unable to stand Patient leans: N/A  Psychiatric Specialty Exam: Physical Exam  Nursing note and vitals reviewed. Constitutional: She is oriented to person, place, and time. She appears well-developed and well-nourished.  HENT:  Head: Normocephalic and atraumatic.  Neck: Normal range of motion.  Respiratory: Effort normal.  Musculoskeletal:     Comments: Patient has quadriparesis.  Neurological: She is alert and oriented to person, place, and time.  Psychiatric: She has a normal mood and affect. Her speech is normal and behavior is normal. Judgment and thought content normal. Cognition and memory are normal.    Review of Systems  Cardiovascular: Negative for chest pain.  Gastrointestinal: Positive for constipation. Negative for abdominal pain, diarrhea, nausea and vomiting.  Psychiatric/Behavioral: Positive for depression. Negative for suicidal ideas. The patient is nervous/anxious.   All other systems reviewed and are negative.   Blood pressure 115/60, pulse 78, temperature 98.4 F (36.9 C),  temperature source Oral, resp. rate 18, SpO2 100 %.There is no height or weight on file to calculate BMI.  General Appearance: Fairly Groomed, overweight, middle aged, Caucasian female, wearing a hospital gown with corrective lenses and dry skin who is lying in bed. NAD.   Eye Contact:  Good  Speech:  Clear and Coherent and Normal Rate  Volume:  Normal  Mood:  Anxious and Depressed  Affect:  Appropriate and Full Range  Thought Process:  Goal Directed, Linear and Descriptions of Associations: Intact  Orientation:  Full (Time, Place, and Person)  Thought Content:  Logical  Suicidal Thoughts:  No  Homicidal Thoughts:  No  Memory:  Immediate;   Good Recent;   Good Remote;   Good  Judgement:  Fair  Insight:  Fair  Psychomotor Activity:  Normal  Concentration:  Concentration: Good and Attention Span: Good  Recall:  Good  Fund of Knowledge:  Good  Language:  Good  Akathisia:  No  Handed:  Right  AIMS (if indicated):   N/A  Assets:  Communication Skills Desire for Improvement Housing Resilience Social Support  ADL's:  Impaired  Cognition:  WNL  Sleep:   N/A   Assessment:  Vanessa Short is a 52 y.o. female who was admitted with worsening hallucinations in the setting of progressive multiple sclerosis. She reports intermittent AVH that are distressing to her. She reports side effects from Zyprexa and discontinued use after 1-2 days. She is agreeable to trying a small dose of Seroquel for hallucinations. She also reports poorly managed anxiety and depression so recommend increasing Prozac. She denies SI or AVH. Patient will need outpatient mental health resources for further medication management.    Treatment Plan Summary: -Increase Prozac 40 mg daily to 50 mg daily for mood and anxiety. -Start Seroquel 25 mg qhs for hallucinations and 12.5 mg BID PRN for anxiety. Can decrease to 12.5 mg qhs if too sedating. -Recommend obtaining EKG to monitor for QTc prolongation when starting or  increasing QTc prolonging agents.  -Please have SW provide patient with outpatient mental health resources for further medication management.  -Patient should follow up regarding abnormal TSH in the setting of mood disorder.  -Psychiatry will sign off on patient at this time. Please consult psychiatry again as needed.    Disposition: No evidence of imminent risk to self or others at present.   Patient does not meet criteria for psychiatric inpatient admission.  Faythe Dingwall, DO 07/30/2018 12:56 PM

## 2018-07-31 LAB — CBC WITH DIFFERENTIAL/PLATELET
ABS IMMATURE GRANULOCYTES: 0.09 10*3/uL — AB (ref 0.00–0.07)
Basophils Absolute: 0.1 10*3/uL (ref 0.0–0.1)
Basophils Relative: 1 %
Eosinophils Absolute: 0.7 10*3/uL — ABNORMAL HIGH (ref 0.0–0.5)
Eosinophils Relative: 9 %
HCT: 32.9 % — ABNORMAL LOW (ref 36.0–46.0)
HEMOGLOBIN: 9.9 g/dL — AB (ref 12.0–15.0)
Immature Granulocytes: 1 %
LYMPHS ABS: 2 10*3/uL (ref 0.7–4.0)
LYMPHS PCT: 25 %
MCH: 31.4 pg (ref 26.0–34.0)
MCHC: 30.1 g/dL (ref 30.0–36.0)
MCV: 104.4 fL — ABNORMAL HIGH (ref 80.0–100.0)
Monocytes Absolute: 1.1 10*3/uL — ABNORMAL HIGH (ref 0.1–1.0)
Monocytes Relative: 14 %
Neutro Abs: 3.9 10*3/uL (ref 1.7–7.7)
Neutrophils Relative %: 50 %
Platelets: 187 10*3/uL (ref 150–400)
RBC: 3.15 MIL/uL — ABNORMAL LOW (ref 3.87–5.11)
RDW: 16 % — ABNORMAL HIGH (ref 11.5–15.5)
WBC: 7.9 10*3/uL (ref 4.0–10.5)
nRBC: 0.5 % — ABNORMAL HIGH (ref 0.0–0.2)

## 2018-07-31 LAB — FOLATE RBC
Folate, Hemolysate: >620 ng/mL
Folate, RBC: >1994 ng/mL (ref >498)
Hematocrit: 31.1 % — ABNORMAL LOW (ref 34.0–46.6)

## 2018-07-31 MED ORDER — FLUOXETINE HCL 10 MG PO CAPS: 50.0000 mg | ORAL_CAPSULE | Freq: Every day | ORAL | 0 refills | Status: DC

## 2018-07-31 MED ORDER — QUETIAPINE FUMARATE 25 MG PO TABS: 12.5000 mg | ORAL_TABLET | Freq: Every day | ORAL | 0 refills | Status: DC | PRN

## 2018-07-31 MED ORDER — QUETIAPINE FUMARATE 25 MG PO TABS: 25.0000 mg | ORAL_TABLET | Freq: Every day | ORAL | 0 refills | Status: DC

## 2018-07-31 MED ORDER — QUETIAPINE FUMARATE 25 MG PO TABS: 12.5000 mg | ORAL_TABLET | Freq: Every day | ORAL | Status: DC | PRN

## 2018-07-31 MED ORDER — HEPARIN SOD (PORK) LOCK FLUSH 100 UNIT/ML IV SOLN
500.0000 [IU] | INTRAVENOUS | Status: AC | PRN
  Administered 2018-07-31: 500 [IU]

## 2018-07-31 NOTE — Progress Notes (Signed)
Family Medicine Teaching Service Daily Progress Note Intern Pager: [phone]  Patient name: Vanessa Short Medical record number: [id number] Date of birth: 1966/01/23 Age: 52 y.o. Gender: female  Primary Care Provider: [facility] Consultants: Neuro, Psychiatry Code Status: full  Pt Overview and Major Events to Date:  12/16 - admitted for worsening hallucination with unsafe dispo plan  Assessment and Plan: Vanessa Short is a 52 y.o. female presenting with worsening hallucinations for the past month.  Past medical history is significant for progressive MS, quadriparesis, dysesthesia, ulcerative colitis, major depression.   Worsening hallucinations  Likely progression of MS Neurology notes that this is likely progression of her MS.  No other clear etiology for recent worsening hallucinations.  Psychiatry recommends starting Seroquel 25 mg nightly to aid with hallucinations and psychiatry follow-up outpatient. -start quetiapine 25 mg HS for hallucinations and 12.5 PRN for anxiety -Social work provided information about outpatient psychiatric care -DC olazipine, patient reports not taking at home  Mild pyuria -s/p vancomycin, cefepime, metronidazole in ED. -Discontinue antibiotics -Continue to monitor symptoms  Multiple sclerosis - -continue Tysabri (natalizumab) -Continue acyclovir  Diastolic heart failure-not currently endorsing any sxs of CHF. Will continue home meds. -Continue labetalol 100 mg -Continue furosemide 40 mg twice daily  Spastic quadriparesis -Continue home medications. -baclofen -dantrolene   Dysesthesia - stable, chronic. secondary to her MS.  Her home medications include oxcarbazepine and the Motrin. -continue oxcarbazepine -continue lamotrigine   Major depressive disorder -  Medications include fluoxetine 40 mg daily.  Increasing to 50 mg daily per psych recommendations. -Increase fluoxetine to 50 mg  daily  Ulcerative colitis - chronic, stable  she follows with GI for ulcerative colitis.  Her medications at home currently include mesalamine. No diarrhea or abdominal pain currently. Last BM was yesterday and it was normal. -Mesalamine   HTN -chronic, stable, blood pressure on admission was 151/89.  Medications include labetalol 111m, furosemide 40 mg. - continue labetalol 1033m- furosemide 40 mg  Overactive bladder -  holding medications due to anticholinergic side effects contributing to her hallucinations. -holding solifenacin -holding Tolterodine  Constipation - managed by her GI doctor.  Her home medication includes amitiza. -Lubiprostone(amitiza)  FEN/GI: Pantoprazole, general diet Prophylaxis: Lovenox  Disposition: DC pending workup for hallucinations  Subjective:  Patient reports one hallucination overnight, a visual hallucination that appeared to be a transparent checkerboard that she could pass through.  She reported no significant grogginess/sedation from her Seroquel last night.  She has no new complaints this morning.  Objective: Temp:  [98 F (36.7 C)-98.9 F (37.2 C)] 98 F (36.7 C) (12/17 2227) Pulse Rate:  [69-82] 69 (12/17 2227) Resp:  [18] 18 (12/17 1548) BP: (115-122)/(60-66) 122/60 (12/17 2227) SpO2:  [99 %] 99 % (12/17 2227)  Physical Exam: General: Alert and cooperative and appears to be in no acute distress HEENT: Neck non-tender without lymphadenopathy, masses or thyromegaly Cardio: Normal A1 and S2, no S3 or S4. Rhythm is regular. No murmurs or rubs.   Pulm: Clear to auscultation bilaterally, no crackles, wheezing, or diminished breath sounds. Normal respiratory effort Abdomen: Bowel sounds normal. Abdomen soft and non-tender.  Extremities: No peripheral edema. Warm/ well perfused.  Strong radial and pedal pulses. Neuro: Cranial nerves grossly intact  Laboratory: Recent Labs  Lab 07/29/18 0630 07/30/18 0550 07/31/18 0410  WBC 10.3 8.2  7.9  HGB 10.8* 9.8* 9.9*  HCT 35.8* 32.8* 32.9*  PLT 212 189 187   Recent Labs  Lab 07/29/18 0630 07/30/18 0550  NA 142 143  K 4.2 3.5  CL 103 103  CO2 29 28  BUN 27* 21*  CREATININE 0.97 0.90  CALCIUM 9.7 9.3  PROT 6.8 5.6*  BILITOT 0.5 0.7  ALKPHOS 91 79  ALT 24 23  AST 21 19  GLUCOSE 101* 94   Imaging/Diagnostic Tests: No new imaging  Matilde Haymaker, MD 07/31/2018, 5:49 AM PGY-1, [place] Intern pager: [phone], text pages welcome

## 2018-07-31 NOTE — Progress Notes (Signed)
Vanessa Short to be D/C'd per MD order.  Discussed with the patient and all questions fully answered.  VSS, Skin clean, dry and intact without evidence of skin break down, no evidence of skin tears noted. IV catheter discontinued intact. Site without signs and symptoms of complications. Dressing and pressure applied.  An After Visit Summary was printed and given to the patient. Patient received prescription.  D/c education completed with patient/family including follow up instructions, medication list, d/c activities limitations if indicated, with other d/c instructions as indicated by MD - patient able to verbalize understanding, all questions fully answered.   Patient instructed to return to ED, call 911, or call MD for any changes in condition.   Patient escorted via [place], and D/C home via private auto.   Lorenza Evangelist [facility] 07/31/2018 6:03 PM

## 2018-07-31 NOTE — Discharge Summary (Signed)
[facility] Discharge Summary  Patient name: Vanessa Short Medical record number: [id number] Date of birth: [birth year] Age: 52 y.o. Gender: female Date of Admission: 07/29/2018  Date of Discharge: 07/31/2018 Admitting Physician: Vanessa Coombe, MD  Primary Care Provider: [facility] Consultants: neuro, psych  Indication for Hospitalization: hallucinations with unsafe dispo  Discharge Diagnoses/Problem List:  Worsening hallucinations, worsening of multiple sclerosis Multiple sclerosis Mild pyuria Diastolic heart failure Spastic quadriparesis Dysesthesia MDD Ulcerative colitis HTN Overactive bladder Constipation  Disposition: Discharged home  Discharge Condition: Stable  Discharge Exam:  General: Alert and cooperative and appears to be in no acute distress HEENT: Neck non-tender without lymphadenopathy, masses or thyromegaly Cardio: Normal A1 and S2, no S3 or S4. Rhythm is regular. No murmurs or rubs.   Pulm: Clear to auscultation bilaterally, no crackles, wheezing, or diminished breath sounds. Normal respiratory effort Abdomen: Bowel sounds normal. Abdomen soft and non-tender.  Extremities: No peripheral edema. Warm/ well perfused.  Strong radial and pedal pulses. Neuro: Cranial nerves grossly intact  Brief Hospital Course:  Vanessa Short is a 52 year old woman who presented with hallucinations that have been worsening for about 1 month.  Her previous medical history is significant for progressive MS, quadriparesis, dysesthesia, ulcerative colitis, major depression.  On admission, she reported worsening hallucinations (audible, visual and tactile) typically in the evening which had been occurring for about the previous month.  She was admitted for evaluation of these hallucinations to rule out medical causes.  During her hospitalization, reversible causes of her hallucinations/psychosis were ruled out and neurology reported  that this is likely due to progression of her multiple sclerosis.  Psychiatry was consulted to evaluate recommend appropriate treatment.  Psychiatry recommendations included Seroquel 25 mg nightly to aid with her hallucinations (with reduction to 12.5 if the initial dose is too sedating) with an additional 12.5 mg of Seroquel available during the day for anxiety.  He also recommended increasing fluoxetine to 50 mg daily.  On the morning of 12/18, she was found to be medically stable and discharged home.  Issues for Follow Up:  1. 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 2. Follow-up with PCP regarding depression and appropriate management moving forward. 3. Olanzapine was discontinued during this hospitalization due to worsening hallucinations.  Significant Procedures: none  Significant Labs and Imaging:  Recent Labs  Lab 07/29/18 0630 07/30/18 0550 07/30/18 0645 07/31/18 0410  WBC 10.3 8.2  --  7.9  HGB 10.8* 9.8*  --  9.9*  HCT 35.8* 32.8* 31.1* 32.9*  PLT 212 189  --  187   Recent Labs  Lab 07/29/18 0630 07/30/18 0550  NA 142 143  K 4.2 3.5  CL 103 103  CO2 29 28  GLUCOSE 101* 94  BUN 27* 21*  CREATININE 0.97 0.90  CALCIUM 9.7 9.3  ALKPHOS 91 79  AST 21 19  ALT 24 23  ALBUMIN 3.5 3.3*    Results/Tests Pending at Time of Discharge: none  Discharge Medications:  Allergies as of 07/31/2018      Reactions   Oxycodone-acetaminophen Other (See Comments)   Other   Risperidone And Related    Became very lethargic   Penicillins Rash   Has patient had a PCN reaction causing immediate rash, facial/tongue/throat swelling, SOB or lightheadedness with hypotension: NO Has patient had a PCN reaction causing severe rash involving mucus membranes or skin necrosis: NO Has patient had a PCN reaction that required hospitalization NO Has  patient had a PCN reaction occurring within the last 10 years:NO If all of the above answers are  "NO", then may proceed with Cephalosporin use.      Medication List    STOP taking these medications   OLANZapine 5 MG tablet Commonly known as:  ZYPREXA     TAKE these medications   acyclovir 400 MG tablet Commonly known as:  ZOVIRAX Take 400 mg by mouth daily. Take every day per patient   AMITIZA 8 MCG capsule Generic drug:  lubiprostone Take 8 mcg by mouth 2 (two) times daily.   aspirin EC 81 MG tablet Take 81 mg by mouth daily.   b complex vitamins tablet Take 1 tablet by mouth daily.   baclofen 20 MG tablet Commonly known as:  LIORESAL TAKE 1 TABLET UP TO 4 TIMES DAILY What changed:    how much to take  how to take this  when to take this  additional instructions   cholecalciferol 1000 units tablet Commonly known as:  VITAMIN D Take 1,000 Units by mouth daily.   dantrolene 50 MG capsule Commonly known as:  DANTRIUM TAKE 1 CAPSULE (50 MG TOTAL) BY MOUTH 3 (THREE) TIMES DAILY.   FLUoxetine 10 MG capsule Commonly known as:  PROZAC Take 5 capsules (50 mg total) by mouth daily. Start taking on:  August 01, 2018 What changed:    medication strength  how much to take   folic acid 174 MCG tablet Commonly known as:  FOLVITE Take 400 mcg by mouth daily.   furosemide 40 MG tablet Commonly known as:  LASIX Take 1 tablet (40 mg total) by mouth 2 (two) times daily.   ibuprofen 200 MG tablet Commonly known as:  ADVIL,MOTRIN Take 200 mg by mouth 3 (three) times daily.   labetalol 100 MG tablet Commonly known as:  NORMODYNE Take 100 mg by mouth daily.   lidocaine-prilocaine cream Commonly known as:  EMLA Apply one inch before port-a cath access prn   multivitamin tablet Take 1 tablet by mouth daily.   natalizumab 300 MG/15ML injection Commonly known as:  TYSABRI Inject 300 mg into the vein every 6 (six) weeks.   omeprazole 40 MG capsule Commonly known as:  PRILOSEC Take 40 mg by mouth 2 (two) times daily.   OXcarbazepine 150 MG  tablet Commonly known as:  TRILEPTAL Take 1 tablet (150 mg total) by mouth 3 (three) times daily.   PENTASA 500 MG CR capsule Generic drug:  mesalamine Take 1,000 mg by mouth 3 (three) times daily.   QUEtiapine 25 MG tablet Commonly known as:  SEROQUEL Take 0.5 tablets (12.5 mg total) by mouth daily as needed (anxiety).   QUEtiapine 25 MG tablet Commonly known as:  SEROQUEL Take 1 tablet (25 mg total) by mouth at bedtime.   sodium chloride 0.65 % Soln nasal spray Commonly known as:  OCEAN Place 1 spray into both nostrils as needed for congestion.   solifenacin 10 MG tablet Commonly known as:  VESICARE Take 10 mg by mouth daily.   tolterodine 2 MG tablet Commonly known as:  DETROL Take 2 mg by mouth every evening.       Discharge Instructions: Please refer to Patient Instructions section of EMR for full details.  Patient was counseled important signs and symptoms that should prompt return to medical care, changes in medications, dietary instructions, activity restrictions, and follow up appointments.   Follow-Up Appointments: Follow-up [facility]. Call.  Specialty:  Professional Counselor Why:  Call first to make sure they accept your insurance. Walk in for first appointment.  Contact information: [facility] [place] [phone]        [place], [place] [facility]. Schedule an appointment as soon as possible for a visit.   Specialty:  Family Medicine Contact information: [address] [place] [address] [phone]           Matilde Haymaker, MD 07/31/2018, 5:35 PM PGY-1, [place]

## 2018-08-16 ENCOUNTER — Other Ambulatory Visit (HOSPITAL_COMMUNITY): Payer: Self-pay | Admitting: Student in an Organized Health Care Education/Training Program

## 2018-08-20 ENCOUNTER — Telehealth: Payer: Self-pay | Admitting: Neurology

## 2018-08-20 NOTE — Telephone Encounter (Signed)
Pt called wanting to know if there is any place that is recommended to get compression hoses and also if her Infusion appts have been scheduled.  Transferred call to Infusion Suite

## 2018-08-20 NOTE — Telephone Encounter (Signed)
Called pt back. She wants to know if she can get fitted for compression stockings. She has bought some from local store but she feels they do not fit appropriately.   Placed pt on hold and called [facility] to see if this was a service they provide. [facility] stated they have several OTC options and they will measure her for appropriate compression sock.  [facility] phone: [phone].   Another option is [facility]. Address: [address], [place], [place]. Phone: [phone].   She also wanted to know if she has been scheduled for her Tysabri infusions. She has left 3 messages and has not heard back. Advised Tina left for the day but I will check with her in the am and call back to let her know tomorrow. Pt verbalized understanding.

## 2018-08-22 ENCOUNTER — Other Ambulatory Visit (HOSPITAL_COMMUNITY): Payer: Self-pay | Admitting: Student in an Organized Health Care Education/Training Program

## 2018-08-22 NOTE — Telephone Encounter (Signed)
Spoke with Otila Kluver. She spoke with patient yesterday and scheduled her Tysabri infusion for 08/26/2018 at 1:00pm.

## 2018-08-30 ENCOUNTER — Other Ambulatory Visit (HOSPITAL_COMMUNITY): Payer: Self-pay | Admitting: Student in an Organized Health Care Education/Training Program

## 2018-09-09 ENCOUNTER — Telehealth: Payer: Self-pay | Admitting: *Deleted

## 2018-09-09 NOTE — Telephone Encounter (Signed)
Faxed completed/signed Tysabri pt status report and reauth questionnaire to [facility] at [phone]. Received confirmation.  Received fax notification back that pt authorized 09/09/2018-04/03/2019. Pt enrollment number: [id number]. Account: [facility]. Site auth #: [id number].

## 2018-09-12 ENCOUNTER — Ambulatory Visit: Payer: Medicare Other | Admitting: Neurology

## 2018-09-18 ENCOUNTER — Other Ambulatory Visit: Payer: Self-pay | Admitting: *Deleted

## 2018-09-18 ENCOUNTER — Telehealth: Payer: Self-pay | Admitting: *Deleted

## 2018-09-18 DIAGNOSIS — Z79899 Other long term (current) drug therapy: Secondary | ICD-10-CM

## 2018-09-18 DIAGNOSIS — G35 Multiple sclerosis: Secondary | ICD-10-CM

## 2018-09-18 NOTE — Telephone Encounter (Signed)
Called pt. Advised she is due to have JCV ab lab repeated. Last done 03/2018. She has next infusion 10/07/18 at 1:00pm and will have labwork done while she is here that day. I placed future lab order and placed her on lab schedule.

## 2018-10-07 ENCOUNTER — Telehealth: Payer: Self-pay | Admitting: *Deleted

## 2018-10-07 ENCOUNTER — Encounter: Payer: Self-pay | Admitting: Neurology

## 2018-10-07 ENCOUNTER — Other Ambulatory Visit: Payer: Self-pay | Admitting: Neurology

## 2018-10-07 ENCOUNTER — Other Ambulatory Visit: Payer: Self-pay | Admitting: *Deleted

## 2018-10-07 ENCOUNTER — Other Ambulatory Visit: Payer: Self-pay

## 2018-10-07 DIAGNOSIS — Z79899 Other long term (current) drug therapy: Secondary | ICD-10-CM

## 2018-10-07 DIAGNOSIS — G35 Multiple sclerosis: Secondary | ICD-10-CM

## 2018-10-07 NOTE — Telephone Encounter (Signed)
Placed JCV lab in [facility] lock box for routine lab pick up.

## 2018-10-08 ENCOUNTER — Telehealth: Payer: Self-pay | Admitting: *Deleted

## 2018-10-08 LAB — COMPREHENSIVE METABOLIC PANEL
ALT: 34 IU/L — AB (ref 0–32)
AST: 19 IU/L (ref 0–40)
Albumin/Globulin Ratio: 2.3 — ABNORMAL HIGH (ref 1.2–2.2)
Albumin: 4.2 g/dL (ref 3.8–4.9)
Alkaline Phosphatase: 98 IU/L (ref 39–117)
BUN/Creatinine Ratio: 24 — ABNORMAL HIGH (ref 9–23)
BUN: 20 mg/dL (ref 6–24)
Bilirubin Total: 0.3 mg/dL (ref 0.0–1.2)
CALCIUM: 8.6 mg/dL — AB (ref 8.7–10.2)
CO2: 26 mmol/L (ref 20–29)
Chloride: 108 mmol/L — ABNORMAL HIGH (ref 96–106)
Creatinine, Ser: 0.83 mg/dL (ref 0.57–1.00)
GFR calc Af Amer: 93 mL/min/{1.73_m2} (ref >59)
GFR calc non Af Amer: 81 mL/min/{1.73_m2} (ref >59)
Globulin, Total: 1.8 g/dL (ref 1.5–4.5)
Glucose: 101 mg/dL — ABNORMAL HIGH (ref 65–99)
Potassium: 3.5 mmol/L (ref 3.5–5.2)
Sodium: 150 mmol/L — ABNORMAL HIGH (ref 134–144)
Total Protein: 6 g/dL (ref 6.0–8.5)

## 2018-10-08 NOTE — Telephone Encounter (Signed)
-----   Message from Britt Bottom, MD sent at 10/08/2018 11:49 AM EST ----- Please let the patient know that the lab work is fine.

## 2018-10-08 NOTE — Telephone Encounter (Signed)
I called and spoke with pt about lab results per Dr. Felecia Shelling note. She verbalized understanding.

## 2018-10-09 ENCOUNTER — Other Ambulatory Visit: Payer: Self-pay | Admitting: Neurology

## 2018-10-16 NOTE — Telephone Encounter (Signed)
JCV ab indeterminate, index: 0.30. Inhibition assay positive. Gave to Dr. Felecia Shelling to review.

## 2018-12-08 ENCOUNTER — Other Ambulatory Visit: Payer: Self-pay | Admitting: Neurology

## 2018-12-10 ENCOUNTER — Telehealth: Payer: Self-pay | Admitting: *Deleted

## 2018-12-10 NOTE — Telephone Encounter (Signed)
Called pt. She needed f/u for MS. Has none scheduled, last seen 06/2018 and Dr. Felecia Shelling wanted her to f/u in 4 months. I scheduled f/u for 12/16/18 at 1:30pm. She could not do appt tomorrow morning, she wanted afternoon appt.  I sent email for [email or web address] at [email or web address] She confirmed receipt and was able to trouble shoot and join waiting room. She is aware to save email for Monday and do the same thing about 15 min before appt.  I updated medication list/pharmacy/allergies on file.   Last Tysabri infusion: 11/19/18 and next infusion scheduled for 12/31/18. She gets infusions q 6 weeks.

## 2018-12-16 ENCOUNTER — Other Ambulatory Visit: Payer: Self-pay

## 2018-12-16 ENCOUNTER — Ambulatory Visit (INDEPENDENT_AMBULATORY_CARE_PROVIDER_SITE_OTHER): Payer: Commercial Managed Care - PPO | Admitting: Neurology

## 2018-12-16 ENCOUNTER — Telehealth: Payer: Self-pay

## 2018-12-16 ENCOUNTER — Encounter: Payer: Self-pay | Admitting: Neurology

## 2018-12-16 DIAGNOSIS — R29898 Other symptoms and signs involving the musculoskeletal system: Secondary | ICD-10-CM | POA: Insufficient documentation

## 2018-12-16 DIAGNOSIS — R208 Other disturbances of skin sensation: Secondary | ICD-10-CM

## 2018-12-16 DIAGNOSIS — Z993 Dependence on wheelchair: Secondary | ICD-10-CM

## 2018-12-16 DIAGNOSIS — G8250 Quadriplegia, unspecified: Secondary | ICD-10-CM | POA: Diagnosis not present

## 2018-12-16 DIAGNOSIS — Z79899 Other long term (current) drug therapy: Secondary | ICD-10-CM

## 2018-12-16 DIAGNOSIS — G35 Multiple sclerosis: Secondary | ICD-10-CM

## 2018-12-16 DIAGNOSIS — R443 Hallucinations, unspecified: Secondary | ICD-10-CM

## 2018-12-16 DIAGNOSIS — R768 Other specified abnormal immunological findings in serum: Secondary | ICD-10-CM

## 2018-12-16 MED ORDER — METHYLPREDNISOLONE 4 MG PO TABS: ORAL_TABLET | ORAL | 0 refills | Status: DC

## 2018-12-16 MED ORDER — FAMOTIDINE 40 MG PO TABS: 40.0000 mg | ORAL_TABLET | Freq: Two times a day (BID) | ORAL | 11 refills | Status: DC

## 2018-12-16 MED ORDER — ARMODAFINIL 200 MG PO TABS: ORAL_TABLET | ORAL | 5 refills | Status: DC

## 2018-12-16 NOTE — Telephone Encounter (Signed)
We received a prior authorization request for Armodafinil 232m. I have completed and submitted the PA on Cover My Meds and should have a determination within 48-72 hours.  Cover My Meds Key: [id number] - PA Case ID: [id number]- Rx #: [id number]

## 2018-12-16 NOTE — Progress Notes (Signed)
[facility]  [name] DOB: Feb 25, 1966  REFERRING CLINICIAN: Adah Salvage, [facility] HISTORY FROM: patient  REASON FOR VISIT: MS   HISTORICAL  CHIEF COMPLAINT:  Chief Complaint  Patient presents with   Multiple Sclerosis    On Tysabri    HISTORY OF PRESENT ILLNESS:  [name] is a 53 y.o. woman diagnosed with MS in 1989.  I will get a Update 12/16/2018: Virtual Visit via Video Note I connected with Marayah Hegner on 12/16/18 at  1:30 PM EDT by a video enabled telemedicine application and verified that I am speaking with the correct person.  I discussed the limitations of evaluation and management by telemedicine and the availability of in person appointments. The patient expressed understanding and agreed to proceed.  History of Present Illness: She is on Tysabri every 6 weeks due to being JCV Ab positive.  Her last infusion was about a week ago.    She did note an improvement in her hands after starting Tysabri.  The latest JCV antibody is still positive though with an improved titer of 0.30 (was 0.74).  She is wheelchair bound.   She gets phasic spasms in her back, oftenrelated to moving.    She has arm weakness, left > right and her fingers sometimes curl up.     She has an electric like sensation in the left upper arm.   Moving intensifies the dysesthesia.   The fingers have tingling but not actual numbness.   The thumb is involved more than the rest of the hand.    This seemed to come on fairly suddenly a couple weeks ago and she has also noted left arm weakness.   She denies neck pain but has some discomfort -- unchanged since before the new symptoms.     At the last visit, she was very lethargic but has been much better.   She still has some lethargy and sleepiness.   She notes waking up with a snort some times.    She dozes off when in the recliner but not in her wheelchair.     She occasioanlly has visual  hallucinations but they are not bothering her much.    Her omeprazole was stopped due to low iron   Observations/Objective She is a well-developed well-nourished woman in no acute distress.  The head is normocephalic and atraumatic.  Sclera are anicteric.  Visible skin appears normal.  The neck has a good range of motion.  Pharynx and tongue have normal appearance.  She is alert and fully oriented with fluent speech and good attention, knowledge and memory.  Extraocular muscles are intact.  Facial strength is normal.  Palatal elevation and tongue protrusion are midline.  Strength appeared to be 3/5 in the right arm, 2/5 with left grip and 0/5 with left wrist and finger extension.  Rapid altering movements were performed better on the right than the left.  I coached her through sensory exam.  She is much more numb in the dorsal radial hand than the other distributions.  Assessment and Plan: Multiple sclerosis ([place]) - Plan: Stratify JCV Antibody Test ([facility]), CBC with Differential/Platelet  Spastic quadriparesis (HCC)  High risk medication use - Plan: Stratify JCV Antibody Test ([facility]), CBC with Differential/Platelet  Wheelchair bound  JC virus antibody positive  Hallucinations  Dysesthesia - Plan: NCV with EMG(electromyography)  Left arm weakness - Plan: NCV with EMG(electromyography)   1.   She will continue Tysabri.  Her last JCV antibody  was actually better at 0.3 (though still positive) compared to the 0.74 last year.  We will recheck the antibodies when she comes back in. 2.   The new symptoms in the left arm are more likely to be due to a radial nerve palsy then to her MS.  To better evaluate she will come in and have a nerve conduction study and EMG performed.  I will also send in a steroid Dosepak to see if that helps some of the pain in the upper arm (likely at the spiral groove) 3.   Nuvigil for fatigue and sleepiness.  She will ask her husband if she has any gasping or snorting at  night or pauses in her breathing.  If any of these is occurring we will need to check a sleep study for possible sleep apnea. 4.   I will see her when she comes in for the NCV/EMG.  Additionally, she will return to see me in 5 to 6 months for her MS follow-up or sooner if there are new or worsening neurologic symptoms.  Follow Up Instructions: I discussed the assessment and treatment plan with the patient. The patient was provided an opportunity to ask questions and all were answered. The patient agreed with the plan and demonstrated an understanding of the instructions.    The patient was advised to call back or seek an in-person evaluation if the symptoms worsen or if the condition fails to improve as anticipated.  I provided 30 minutes of non-face-to-face time during this encounter.  ___________________________ From prior visits Update 07/08/2018: Her last JCV Ab was positive at 0.74 so we changed the Tysabri from every 4 weeks to 6 weeks.    Her last infusion was 8 weeks ago as she had an infection and ws on antibiotics a few weeks ago.    She had a respiratory infection last month and was prescribed antibiotics and a a steroid.   She went to the ED and received a new prescription, doxycycline (she just completed).     She had no fever but she perceived that the temperature ws up and down.   She has more hallucinations since the recent infection.   She took one risperidone but it made her very lethargic.    No coughing, urine is not cloudy.     CXR 11/10 showed some left lower patchiness but no clear pneumonia.      CT scan had shown gallstones.  She is not c/o pain.      Currently, she is very lethargic, more so than typical.   She is weaker in hands...legs always weak.     She is on baclofen 20 mg po qid and dantrolene tid.     Update 03/06/2018: She continues on Tysabri and she tolerates it well.  She feels the MS has been stable.  She has had no exacerbations.  When last checked, she had a low  positive JCV antibody implying a 1: 2000 risk of PML after 2 years.  She has severe weakness in her legs associated with spasticity and is in a wheelchair.  She takes baclofen (60-70 mg daily) and dantrolene 50 mg tid to help with some of the phasic spasm episodes.    She has weakness in her hands and feels strength has improved in her hands on Tysabri.   She also can feed herself better.   She has mild dysphagia but voice is normal.   She has had dysesthesias and they are helped by  Trileptal.   Her left vision is reduced and she will sometimes have diplopia looking left.  She feels her bladder function is doing well on Vesicare.  She has constipation helped by Amitiza.      Fatigue has done better on Tysabri.  She usually falls asleep easily but will have sleep maintenance insomnia.  She denies any more issues with hallucinations.  Update 10/31/2017: She feels her MS is stable. .  She has had 2 Tysabri infusions with the first 1 in February and the last one yesterday.  JCV antibody is indeterminate at 0.35 but the inhibition assay was positive.  We have discussed that there is a small risk of PML that could increase the longer she is on the medication.  She does feel that her fatigue has done better since starting Tysabri and she feels that her hands are doing better as well.    She has severe weakness in the legs with spasticity. Spasticity is worse when anxious.    She is taking baclofen 5-6 pills (10 mg each) daily. She took Marinol 2.5 mg but had excessive lethargy and quit.   That has helped her some.  Dysesthesias are much better and she is only on Trileptal now (stopped lamotrigine and amitriptyline).    Bladder function is doing well on Vesicare.  Amitiza helps constipation.   She denies any major problems with her vision but will sometimes have diplopia when she looks to the left.  She has had issues with fatigue though it is better on the Tysabri.  Most nights, she will fall asleep well but will  often wake up.  This is worse since her GYN surgery and going through menopause.  She has had some depression that is fairly stable.  In the past she was having visual hallucinations sometimes seeing her husband when he is not around.      Update 07/03/2017:    Her MS is doing about the same.   She does not note any definite new symptoms or exacerbation but she does feel that her arm strength, much worse with her pneumonia and has not returned to baseline. Spasticity is still a problem in the legs.     For spasticity,she is on baclofen 10 mg po 5-6 pills a day.      She was taking Marinol 2.5 mg bid but felt a little out of it and backed off to just once a day which is more beneficial for her.   Bladder is doing ok on Vesicare.   When she looks to the far left she notes diplopia and she shuts her left eye to avoid.    She notes some fatigue but this is about the same as at the last visit. She is sleeping well most nights but will wake up more if her husbands snoring is loud.    She has depression. He feels this is stable patient does well. She is not having as many hallucinations.  From 02/27/2017: MS:   She is on Aubagio,  she tolerates it well. She has noted some worsening while on the medication clinically. In the past, we have discussed ocrelizumab.  Gait/Strength/sensation:  Legs are doing the same with severe weakness and spasticity.  Dantrolene has not helped much for spasticity.   She takes baclofen 4.5 pills a day and we discussed increasing this and decreasing the dantrolene.   Spasms often occur when she lays down in bed.   She has dysesthetic nerve pain in her legs,  helped by amitriptyline and lamotrigine.     In the past, we tried Botox injections into the left arm but  Botox did not improve functioning.   Writing has become more difficulty. She also has had more problems controlling her wheelchair and has had control on her current wheelchair.    Sensation/pain:  She has right anterior  shoulder region dysesthesia but this is milder than last year.  Gabapentin was not tolerated.    Lamotrigine 150 mg po bid with amitriptyline helped with dysesthesia at first but less now.   We just went up on the lamotrigine.    Vision:   Vision is about the same.  She has mild decreased acuity and occasional diplopia.        Bladder/Bowel:  She has bladder dysfunction with some benefit form Vesicare.   She more incontinence and always uses Depends.  She is on Linzess due to many of her med's making constipation worse.  She also has ulcerative colitis and requires frequent colonoscopies  Fatigue/sleep:   She has daily fatigue .   She has some insomnia at times.     She has nocturia at night and sleep is variable.    Hallucinations:  Prior to the hospitalization, she would get occasional mild visual hallucinations. These are occurring more. When she was at her worse, the last week of December in the first couple days in January, she had more hallucinations and also had severe confusion.    Mood/Cognition     she notes a mild depression. She has some increased stress with some family issues such as being the executor of her father's will and  having a teenage daughter. There is mild anxiety. Earlier this year, in association with a severe infection requiring hospitalization, she had cognitive issues. Back to baseline and currently she is doing well with mild reduced attention..  MS History:  She presented with a Lhermite's syndrome in 1988 or 1989.   MRIs of the head and neck were performed. They showed lesions consistent with the diagnosis of multiple sclerosis. Lumbar puncture was not necessary. Over the next 15 years, she would get some exacerbations and have courses of steroids. However, a disease modifying therapy was not prescribed. By 2004, she had difficulty with her gait and would often have to lean on somebody for support.  She started Copaxone that year and stayed on it for about one half  years. She stopped due to a lot of itching. She then switched to Rebif. While on Copaxone Rebif she continues to have exacerbations and further difficulties with her gait. About 2012 she switched to [facility] and has been on that medicine since. She tolerates Aubagio well but is not sure how well it is working for her.   She was off Aubagio x 2-3 weeks in 2013 and felt she did worse.  Last MRI's about 3 years ago.    REVIEW OF SYSTEMS:  Constitutional: No fevers, chills, sweats, or change in appetite.  Has fatigue and poor slep Eyes: see above.   No eye pain Ear, nose and throat: No hearing loss, ear pain, nasal congestion, sore throat Cardiovascular: No chest pain, palpitations Respiratory:  No shortness of breath at rest or with exertion.   No wheezes GastrointestinaI: Has UC - doing well.   No nausea, vomiting, diarrhea, abdominal pain, fecal incontinence Genitourinary:  see above Musculoskeletal:  No neck pain, back pain Integumentary: No rash, pruritus, skin lesions Neurological: as above Psychiatric: as above. Endocrine: No palpitations,  diaphoresis, change in appetite, change in weigh or increased thirst Hematologic/Lymphatic:  No anemia, purpura, petechiae. Allergic/Immunologic: No itchy/runny eyes, nasal congestion, recent allergic reactions, rashes  ALLERGIES: Allergies  Allergen Reactions   Oxycodone-Acetaminophen Other (See Comments)    Other   Risperidone And Related     Became very lethargic   Penicillins Rash    Has patient had a PCN reaction causing immediate rash, facial/tongue/throat swelling, SOB or lightheadedness with hypotension: NO Has patient had a PCN reaction causing severe rash involving mucus membranes or skin necrosis: NO Has patient had a PCN reaction that required hospitalization NO Has patient had a PCN reaction occurring within the last 10 years:NO If all of the above answers are "NO", then may proceed with Cephalosporin use.    HOME  MEDICATIONS: Outpatient Medications Prior to Visit  Medication Sig Dispense Refill   acyclovir (ZOVIRAX) 400 MG tablet Take 400 mg by mouth daily. Take every day per patient  0   AMITIZA 8 MCG capsule Take 8 mcg by mouth 2 (two) times daily. GI MD prescribing- will take 2 caps BID prn  1   aspirin EC 81 MG tablet Take 81 mg by mouth daily.     b complex vitamins tablet Take 1 tablet by mouth daily.      baclofen (LIORESAL) 20 MG tablet TAKE 1 TABLET UP TO 4 TIMES DAILY (Patient taking differently: Take 20 mg by mouth 4 (four) times daily. ) 360 tablet 4   cholecalciferol (VITAMIN D) 1000 UNITS tablet Take 1,000 Units by mouth daily.      dantrolene (DANTRIUM) 50 MG capsule TAKE 1 CAPSULE (50 MG TOTAL) BY MOUTH 3 (THREE) TIMES DAILY. 270 capsule 3   FLUoxetine (PROZAC) 10 MG capsule Take 5 capsules (50 mg total) by mouth daily. 542 capsule 0   folic acid (FOLVITE) 706 MCG tablet Take 400 mcg by mouth daily.      furosemide (LASIX) 40 MG tablet Take 1 tablet (40 mg total) by mouth 2 (two) times daily. 30 tablet    ibuprofen (ADVIL,MOTRIN) 200 MG tablet Take 200 mg by mouth 3 (three) times daily.      labetalol (NORMODYNE) 100 MG tablet Take 100 mg by mouth daily.     lidocaine-prilocaine (EMLA) cream Apply one inch before port-a cath access prn 30 g 3   mesalamine (PENTASA) 500 MG CR capsule Take 1,000 mg by mouth 3 (three) times daily.      Multiple Vitamin (MULTIVITAMIN) tablet Take 1 tablet by mouth daily.     natalizumab (TYSABRI) 300 MG/15ML injection Inject 300 mg into the vein every 6 (six) weeks.      OXcarbazepine (TRILEPTAL) 150 MG tablet TAKE 1 TABLET BY MOUTH 3 TIMES A DAY 270 tablet 3   QUEtiapine (SEROQUEL) 25 MG tablet Take 0.5 tablets (12.5 mg total) by mouth daily as needed (anxiety). 60 tablet 0   QUEtiapine (SEROQUEL) 25 MG tablet Take 1 tablet (25 mg total) by mouth at bedtime. 30 tablet 0   sodium chloride (OCEAN) 0.65 % SOLN nasal spray Place 1 spray into  both nostrils as needed for congestion.  0   tolterodine (DETROL) 2 MG tablet Take 2 mg by mouth every evening.  1   omeprazole (PRILOSEC) 40 MG capsule Take 40 mg by mouth 2 (two) times daily.     No facility-administered medications prior to visit.     PAST MEDICAL HISTORY: Past Medical History:  Diagnosis Date   Headache    History  of kidney stones    Hypertension    Kidney stones    Movement disorder    Multiple sclerosis ([place])    Neuropathy    Ulcerative colitis ([place])    Vision abnormalities     PAST SURGICAL HISTORY: Past Surgical History:  Procedure Laterality Date   ENDOMETRIAL ABLATION  10/10/2017   KIDNEY STONE SURGERY     PORTACATH PLACEMENT N/A 10/16/2017   Procedure: ULTRA SOUND GUIDED INSERTION PORT-A-CATH ERAS PATHWAY;  Surgeon: Mickeal Skinner, MD;  Location: [facility];  Service: General;  Laterality: N/A;    FAMILY HISTORY: Family History  Problem Relation Age of Onset   Dementia Mother    Hypertension Father    Hyperlipidemia Father    Diabetes Father    Heart disease Father    Breast cancer Neg Hx     SOCIAL HISTORY:  Social History   Socioeconomic History   Marital status: Married    Spouse name: Not on file   Number of children: Not on file   Years of education: Not on file   Highest education level: Not on file  Occupational History   Not on file  Social Needs   Financial resource strain: Not on file   Food insecurity:    Worry: Not on file    Inability: Not on file   Transportation needs:    Medical: Not on file    Non-medical: Not on file  Tobacco Use   Smoking status: Never Smoker   Smokeless tobacco: Never Used  Substance and Sexual Activity   Alcohol use: Yes    Comment: occasional glass of wine   Drug use: No   Sexual activity: Not on file  Lifestyle   Physical activity:    Days per week: Not on file    Minutes per session: Not on file   Stress: Not on file  Relationships    Social connections:    Talks on phone: Not on file    Gets together: Not on file    Attends religious service: Not on file    Active member of club or organization: Not on file    Attends meetings of clubs or organizations: Not on file    Relationship status: Not on file   Intimate partner violence:    Fear of current or ex partner: Not on file    Emotionally abused: Not on file    Physically abused: Not on file    Forced sexual activity: Not on file  Other Topics Concern   Not on file  Social History Narrative   Not on file     PHYSICAL EXAM  There were no vitals filed for this visit.  There is no height or weight on file to calculate BMI.   General: The patient is well-developed and well-nourished and in no acute distress  Skin: Extremities are without rash.  Changes in and around 5th digit nail on the right.       Neurologic Exam  Mental status: The patient is lethargic and oriented x 2 at the time of the examination.  Focus and attention was reduced.  Speech is normal.  Cranial nerves: Extraocular movements are full.   Facial strength and sensation is normal.  Trapezius strength is normal.  There is no dysarthria.  No obvious hearing deficits are noted.  Motor:  Muscle bulk is normal . Tone is increased in the legs, right greater than left. Tone is also increase in the arms, slightly more on  the right.  She has no definite strength in the legs.. Strength is 2-/5 in the left arm and 2/5 in the right arm. Tone is increased in the legs, right greater than left.  She also has increased tone in the arms   Sensory: She appears to have symmetric vibration sensation in the arms and reduced vibration sensation at the knees.  Gait and station: She can not stand or walk.    Reflexes: She has increased reflexes in her legs.    DIAGNOSTIC DATA (LABS, IMAGING, TESTING) - I reviewed patient records, labs, notes, testing and imaging myself where available.  Lab Results   Component Value Date   WBC 7.9 07/31/2018   HGB 9.9 (L) 07/31/2018   HCT 32.9 (L) 07/31/2018   MCV 104.4 (H) 07/31/2018   PLT 187 07/31/2018      Component Value Date/Time   NA 150 (H) 10/07/2018 1459   K 3.5 10/07/2018 1459   CL 108 (H) 10/07/2018 1459   CO2 26 10/07/2018 1459   GLUCOSE 101 (H) 10/07/2018 1459   GLUCOSE 94 07/30/2018 0550   BUN 20 10/07/2018 1459   CREATININE 0.83 10/07/2018 1459   CALCIUM 8.6 (L) 10/07/2018 1459   PROT 6.0 10/07/2018 1459   ALBUMIN 4.2 10/07/2018 1459   AST 19 10/07/2018 1459   ALT 34 (H) 10/07/2018 1459   ALKPHOS 98 10/07/2018 1459   BILITOT 0.3 10/07/2018 1459   GFRNONAA 81 10/07/2018 1459   GFRAA 93 10/07/2018 1459       ASSESSMENT AND PLAN  Multiple sclerosis ([facility]CC) - Plan: Stratify JCV Antibody Test ([facility]), CBC with Differential/Platelet  Spastic quadriparesis (HCC)  High risk medication use - Plan: Stratify JCV Antibody Test ([facility]), CBC with Differential/Platelet  Wheelchair bound  JC virus antibody positive  Hallucinations  Dysesthesia - Plan: NCV with EMG(electromyography)  Left arm weakness - Plan: NCV with EMG(electromyography)    Cassandre Oleksy A. Felecia Shelling, MD, PhD 04/15/7638, 4:32 PM Certified in Neurology, Clinical Neurophysiology, Sleep Medicine, Pain Medicine and Neuroimaging  [facility] [address], [place], [place] [address] [phone]

## 2018-12-17 ENCOUNTER — Telehealth: Payer: Self-pay | Admitting: *Deleted

## 2018-12-17 NOTE — Telephone Encounter (Signed)
Gave new orders for Tysabri to intrafusion Graylon Gunning, Therapist, sports) with recent OV notes.

## 2018-12-17 NOTE — Telephone Encounter (Signed)
Ok... I discussed the dysesthetic sensation with her extensively at the virtual visit yesterday.  That is why we are ordering the NCV/EMG.  Vanessa Short can we see if we can get the EMG done next week?

## 2018-12-17 NOTE — Telephone Encounter (Signed)
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 Felecia Shelling. Pt can utilize goodrx coupon towards prescription.  I called pt and relayed above information. She is going to hold off on taking medication right now. She decided against it at this time.  She wanted me to let Dr. Felecia Shelling know that she is having an electric feeling in left arm between elbow and shoulder. This is continuing to get worse per pt. Occurs more with movement. It does resolve when she rests her arm.

## 2018-12-18 NOTE — Telephone Encounter (Signed)
Tried contacting patient to set up NCV/EMG but when number is dialed it goes straight to a busy signal. I will try again shortly.

## 2018-12-23 NOTE — Telephone Encounter (Signed)
Patient is scheduled for May 19th

## 2018-12-23 NOTE — Telephone Encounter (Signed)
Called pt. She is unable to do test this week. I transferred call to Elmyra Ricks to work out time to schedule test.

## 2018-12-23 NOTE — Telephone Encounter (Signed)
I tried contacting the number on file again but it goes straight to a quick busy signal.

## 2018-12-30 NOTE — Telephone Encounter (Signed)
Pt is calling in to cancel NCS for tomorrow//pt is wanting to r/s//

## 2018-12-31 ENCOUNTER — Telehealth: Payer: Self-pay | Admitting: *Deleted

## 2018-12-31 ENCOUNTER — Encounter: Payer: Self-pay | Admitting: Neurology

## 2018-12-31 NOTE — Telephone Encounter (Signed)
Placed JCV lab in [facility] lock box for routine lab pick up. Results pending. 

## 2018-12-31 NOTE — Addendum Note (Signed)
Addended by: Inis Sizer D on: 12/31/2018 02:52 PM   Modules accepted: Orders

## 2019-01-01 LAB — CBC WITH DIFFERENTIAL/PLATELET
Basophils Absolute: 0.1 10*3/uL (ref 0.0–0.2)
Basos: 1 %
EOS (ABSOLUTE): 1.1 10*3/uL — ABNORMAL HIGH (ref 0.0–0.4)
Eos: 11 %
Hematocrit: 35.7 % (ref 34.0–46.6)
Hemoglobin: 12.3 g/dL (ref 11.1–15.9)
Immature Grans (Abs): 0.1 10*3/uL (ref 0.0–0.1)
Immature Granulocytes: 1 %
Lymphocytes Absolute: 2.3 10*3/uL (ref 0.7–3.1)
Lymphs: 23 %
MCH: 32.4 pg (ref 26.6–33.0)
MCHC: 34.5 g/dL (ref 31.5–35.7)
MCV: 94 fL (ref 79–97)
Monocytes Absolute: 0.8 10*3/uL (ref 0.1–0.9)
Monocytes: 8 %
Neutrophils Absolute: 5.8 10*3/uL (ref 1.4–7.0)
Neutrophils: 56 %
Platelets: 135 10*3/uL — ABNORMAL LOW (ref 150–450)
RBC: 3.8 x10E6/uL (ref 3.77–5.28)
RDW: 13.8 % (ref 11.7–15.4)
WBC: 10.1 10*3/uL (ref 3.4–10.8)

## 2019-01-09 NOTE — Telephone Encounter (Signed)
JCV ab drawn on 12/31/18 indeterminate, index: 0.26. Inhibition assay positive. Gave to Dr. Felecia Shelling to review.

## 2019-01-14 ENCOUNTER — Telehealth: Payer: Self-pay

## 2019-01-14 NOTE — Telephone Encounter (Signed)
Received request for PA for ar[facility]afinil from [facility]. Completed pa via covermymeds. Key: [id number]. Sent to [facility]. Should have a determination in 3-5 business days.

## 2019-01-14 NOTE — Telephone Encounter (Signed)
PA for armodafinil was denied by [facility] since pt does not have osa, narcolepsy, or shift work disorder.  I have written an appeal letter, given to Dr. Felecia Shelling for review.

## 2019-01-15 NOTE — Telephone Encounter (Signed)
Faxed completed/signed appeal letter to Prescription claim appeals [place] [facility] at [id number]. Received fax confirmation. Waiting on determination.

## 2019-01-16 NOTE — Telephone Encounter (Signed)
Appeal denied via [facility]. States armodafinil not medically necessary. Only allows coverage for the following: narcolepsy confirmed by sleep lab eval, dx of shift work disorder, dx of OSA confirmed by polysomnography. Will speak with Dr. Felecia Shelling about next steps.

## 2019-01-16 NOTE — Telephone Encounter (Signed)
Called and spoke with pt. Advised we can send rx to [facility] or [facility] near her instead for her to use goodrx coupon. It would be about 23 dollars. She declined. She is going to hold off on starting medication for now. She asked pharmacy to cx prescription.   She may have to cx EMG/NCS on 01/21/19. She will call back tomorrow to let us know for sure. I did let pt know we are closed on Friday's currently d/t covid-19 but phone staff still answering calls.

## 2019-01-21 ENCOUNTER — Encounter: Payer: Self-pay | Admitting: Neurology

## 2019-03-03 ENCOUNTER — Telehealth: Payer: Self-pay | Admitting: *Deleted

## 2019-03-03 NOTE — Telephone Encounter (Signed)
Faxed completed/signed Tysabri pt status report and reauth questionnaire to [facility] at [phone]. Received confirmation.  

## 2019-03-03 NOTE — Telephone Encounter (Signed)
Received fax notification from touch prescribing program that pt re-authorized for Tysabri from 03/03/19-10/03/19. Pt enrollment number: [id number]. Account: [facility]. Site auth number: [id number].

## 2019-03-04 ENCOUNTER — Encounter (INDEPENDENT_AMBULATORY_CARE_PROVIDER_SITE_OTHER): Payer: Medicare Other | Admitting: Neurology

## 2019-03-04 ENCOUNTER — Other Ambulatory Visit: Payer: Self-pay

## 2019-03-04 ENCOUNTER — Ambulatory Visit (INDEPENDENT_AMBULATORY_CARE_PROVIDER_SITE_OTHER): Payer: Commercial Managed Care - PPO | Admitting: Neurology

## 2019-03-04 ENCOUNTER — Other Ambulatory Visit: Payer: Self-pay | Admitting: Neurology

## 2019-03-04 DIAGNOSIS — R29898 Other symptoms and signs involving the musculoskeletal system: Secondary | ICD-10-CM | POA: Diagnosis not present

## 2019-03-04 DIAGNOSIS — R208 Other disturbances of skin sensation: Secondary | ICD-10-CM | POA: Diagnosis not present

## 2019-03-04 DIAGNOSIS — Z0289 Encounter for other administrative examinations: Secondary | ICD-10-CM

## 2019-03-04 MED ORDER — HYDROXYZINE HCL 25 MG PO TABS: 25.0000 mg | ORAL_TABLET | Freq: Three times a day (TID) | ORAL | 5 refills | Status: DC | PRN

## 2019-03-04 NOTE — Progress Notes (Signed)
Full Name: Irving Lubbers Gender: Female MRN #: [id number] Date of Birth: Oct 10, 1965    Visit Date: 03/04/2019 13:36 Age: 53 Years 72 Months Old Examining Physician: Arlice Colt, MD  Referring Physician: Arlice Colt, MD    History:  Ms. Rayburn is a 53 year old woman with multiple sclerosis who has had progressive weakness in the left arm and to a much lesser extent in the right arm.  The weakness seems especially severe in the radially innervated muscles.    Nerve conduction studies:  Bilateral median and ulnar motor responses were normal.  Bilateral radial motor responses were minimally slowed across the spiral groove but had normal amplitudes and distal latencies.  Bilateral median, ulnar and radial sensory responses were normal.  Needle EMG: EMG of selected muscles of the left arm was performed.  Motor unit morphology and recruitment was normal in the deltoid, triceps and biceps.  She had no spontaneous motor units in the [facility] or FDIO muscle but no acute denervation.  Impression: This NCV/EMG study shows the following: 1.   There is no evidence of a neuropathy involving the arms. 2.   Absence of motor unit firing in some of the muscles of the left arm is consistent with a central process such as MS.  Miles Leyda A. Felecia Shelling, MD, PhD, FAAN Certified in Neurology, Clinical Neurophysiology, Sleep Medicine, Pain Medicine and Neuroimaging Director, [facility] [address] [place], [place] [address] [phone]         [facility]    Nerve / Sites Muscle Latency Ref. Amplitude Ref. Rel Amp Segments Distance Velocity Ref. Area    ms ms mV mV %  cm m/s m/s mVms  L Median - APB     Wrist APB 3.6 ?4.4 5.7 ?4.0 100 Wrist - APB 7   23.2     Upper arm APB 7.6  5.6  97.6 Upper arm - Wrist 20 50 ?49 21.6  R Median - APB     Wrist APB 4.1 ?4.4 9.7 ?4.0 100 Wrist - APB 7   45.4     Upper arm APB 7.8   7.0  71.9 Upper arm - Wrist 18 49 ?49 33.1  L Ulnar - ADM     Wrist ADM 3.0 ?3.3 10.1 ?6.0 100 Wrist - ADM 7   40.8     B.Elbow ADM 6.5  9.8  97.3 B.Elbow - Wrist 17 49 ?49 38.5     A.Elbow ADM 8.5  9.6  97.5 A.Elbow - B.Elbow 10 49 ?49 38.8         A.Elbow - Wrist      R Ulnar - ADM     Wrist ADM 3.0 ?3.3 11.7 ?6.0 100 Wrist - ADM 7   47.3     B.Elbow ADM 6.1  11.4  97.8 B.Elbow - Wrist 16 51 ?49 45.9     A.Elbow ADM 8.1  10.8  94.2 A.Elbow - B.Elbow 10 49 ?49 43.1         A.Elbow - Wrist      L Radial - EIP     Forearm EIP 2.0 ?2.9 2.7 ?2.0 100 Forearm - EIP 4  ?49 18.2     Elbow EIP 4.7  2.7  100 Elbow - Forearm 15 55  20.8     Spiral Gr EIP 6.9  2.5  92.4 Spiral Gr - Elbow 10 46  19.7  R  Radial - EIP     Forearm EIP 1.8 ?2.9 3.3 ?2.0 100 Forearm - EIP 4  ?49 27.7     Elbow EIP 4.4  3.1  94.3 Elbow - Forearm 15 58  28.4     Spiral Gr EIP 6.6  2.6  84.3 Spiral Gr - Elbow 10 45  23.6                      SNC    Nerve / Sites Rec. Site Peak Lat Ref.  Amp Ref. Segments Distance    ms ms V V  cm  L Radial - Anatomical snuff box (Forearm)     Forearm Wrist 2.4 ?2.9 31 ?15 Forearm - Wrist 10  R Radial - Anatomical snuff box (Forearm)     Forearm Wrist 2.7 ?2.9 25 ?15 Forearm - Wrist 10  L Median - Orthodromic (Dig II, Mid palm)     Dig II Wrist 3.0 ?3.4 15 ?10 Dig II - Wrist 13  R Median - Orthodromic (Dig II, Mid palm)     Dig II Wrist 3.2 ?3.4 15 ?10 Dig II - Wrist 13  L Ulnar - Orthodromic, (Dig V, Mid palm)     Dig V Wrist 3.0 ?3.1 9 ?5 Dig V - Wrist 11  R Ulnar - Orthodromic, (Dig V, Mid palm)     Dig V Wrist 3.1 ?3.1 6 ?5 Dig V - Wrist 61                 F  Wave    Nerve F Lat Ref.   ms ms  L Ulnar - ADM 27.7 ?32.0  R Ulnar - ADM 27.2 ?32.0         EMG full       EMG Summary Table    Spontaneous MUAP Recruitment  Muscle IA Fib PSW Fasc Other Amp Dur. Poly Pattern  L. Deltoid Normal None None None _______ Normal Normal Normal Normal  L. Triceps brachii Normal  None None None _______ Normal Normal Normal Normal  L. Biceps brachii Normal None None None cramp Normal Normal Normal Normal  L. Extensor digitorum communis Normal None None None _______    No Activity  L. First dorsal interosseous Normal None None None _______    No Activity

## 2019-04-07 ENCOUNTER — Telehealth: Payer: Self-pay | Admitting: Neurology

## 2019-04-07 DIAGNOSIS — G8250 Quadriplegia, unspecified: Secondary | ICD-10-CM

## 2019-04-07 DIAGNOSIS — Z993 Dependence on wheelchair: Secondary | ICD-10-CM

## 2019-04-07 DIAGNOSIS — G35 Multiple sclerosis: Secondary | ICD-10-CM

## 2019-04-07 NOTE — Telephone Encounter (Signed)
Dr. Felecia Shelling- would you like to write rx's or refer to PT for eval?

## 2019-04-07 NOTE — Telephone Encounter (Signed)
Pt is need of a sit to stand lift and also a script for a boot to keep her R leg propped up right. Please advise.

## 2019-04-15 NOTE — Telephone Encounter (Signed)
Faxed order for sit/stand lift to [facility] at [phone]. Received fax confirmation. Placed rx in mail to pt for boot.

## 2019-04-15 NOTE — Addendum Note (Signed)
Addended by: Hope Pigeon on: 04/15/2019 09:40 AM   Modules accepted: Orders

## 2019-04-15 NOTE — Telephone Encounter (Signed)
I called pt. She would like sit/stand lift rx sent to [facility]. Current one broken and unable to fix. She already has boot for R leg but would like rx to submit for tax purposes. She would like this mailed. I verified address on file. She will call back if anything further needed.

## 2019-04-15 NOTE — Telephone Encounter (Signed)
I will write a script

## 2019-05-06 ENCOUNTER — Other Ambulatory Visit: Payer: Self-pay | Admitting: Neurology

## 2019-05-08 ENCOUNTER — Other Ambulatory Visit: Payer: Self-pay | Admitting: Family Medicine

## 2019-05-08 DIAGNOSIS — Z1231 Encounter for screening mammogram for malignant neoplasm of breast: Secondary | ICD-10-CM

## 2019-05-22 ENCOUNTER — Other Ambulatory Visit: Payer: Self-pay | Admitting: Family Medicine

## 2019-05-26 ENCOUNTER — Other Ambulatory Visit (HOSPITAL_COMMUNITY): Payer: Self-pay | Admitting: Family Medicine

## 2019-05-26 DIAGNOSIS — G35 Multiple sclerosis: Secondary | ICD-10-CM

## 2019-05-26 DIAGNOSIS — S79922A Unspecified injury of left thigh, initial encounter: Secondary | ICD-10-CM

## 2019-05-26 DIAGNOSIS — S79912A Unspecified injury of left hip, initial encounter: Secondary | ICD-10-CM

## 2019-06-05 ENCOUNTER — Ambulatory Visit (HOSPITAL_COMMUNITY)
Admission: RE | Admit: 2019-06-05 | Discharge: 2019-06-05 | Disposition: A | Discharge status: Home / Self Care | Payer: Commercial Managed Care - PPO | Source: Ambulatory Visit | Attending: Family Medicine | Admitting: Family Medicine

## 2019-06-05 ENCOUNTER — Other Ambulatory Visit: Payer: Self-pay

## 2019-06-05 DIAGNOSIS — G35 Multiple sclerosis: Secondary | ICD-10-CM | POA: Insufficient documentation

## 2019-06-05 DIAGNOSIS — S79912A Unspecified injury of left hip, initial encounter: Secondary | ICD-10-CM | POA: Diagnosis present

## 2019-06-05 DIAGNOSIS — S79922A Unspecified injury of left thigh, initial encounter: Secondary | ICD-10-CM | POA: Insufficient documentation

## 2019-06-24 ENCOUNTER — Ambulatory Visit (INDEPENDENT_AMBULATORY_CARE_PROVIDER_SITE_OTHER): Payer: Commercial Managed Care - PPO | Admitting: Neurology

## 2019-06-24 ENCOUNTER — Encounter: Payer: Self-pay | Admitting: Neurology

## 2019-06-24 ENCOUNTER — Ambulatory Visit: Payer: Commercial Managed Care - PPO

## 2019-06-24 ENCOUNTER — Telehealth: Payer: Self-pay | Admitting: *Deleted

## 2019-06-24 VITALS — BP 132/73 | HR 61 | Temp 98.2°F | Ht 63.0 in

## 2019-06-24 DIAGNOSIS — G35 Multiple sclerosis: Secondary | ICD-10-CM

## 2019-06-24 DIAGNOSIS — G8250 Quadriplegia, unspecified: Secondary | ICD-10-CM | POA: Diagnosis not present

## 2019-06-24 DIAGNOSIS — Z79899 Other long term (current) drug therapy: Secondary | ICD-10-CM | POA: Diagnosis not present

## 2019-06-24 NOTE — Progress Notes (Addendum)
[facility]  [name] DOB: [birth year]  REFERRING CLINICIAN: Adah Salvage, [facility] HISTORY FROM: patient  REASON FOR VISIT: MS   HISTORICAL  CHIEF COMPLAINT:  Chief Complaint  Patient presents with  . Follow-up    Rm 13, with caretaker. Here for infusion today. MD agreed to do f/u while she was here. She is in electric WC. Next infusion: 08/05/2019. Appt reminder card given.    HISTORY OF PRESENT ILLNESS:  Vanessa Short is a 53 y.o. woman diagnosed with MS in 1989.   She a relapsing/active form of second progressive MS  Update 06/24/2019: She is on Tysabri and notes no exacerbation but her left side continues to slowly worsen.   Her JCV Ab is 0.26.  She is wheelchair-bound.  Spasticity is doing a little bit better but she still will have occasional spasms in the back and legs with movement at times.  We discussed referral to physical therapy.  She felt she received a benefit but stopped due to COVID-19.  Due to the worsening weakness, we did a nerve conduction and EMG study couple months ago.  It was more consistent with a central cause of her weakness rather than a neuropathy.  She is sleeping well most nights but does still have some daytime somnolence.  She still has occasional visual hallucination though it is not troublesome.    Update 12/16/2018 (virtual) She is on Tysabri every 6 weeks due to being JCV Ab positive.  Her last infusion was about a week ago.    She did note an improvement in her hands after starting Tysabri.  The latest JCV antibody is still positive though with an improved titer of 0.30 (was 0.74).  She is wheelchair bound.   She gets phasic spasms in her back, oftenrelated to moving.    She has arm weakness, left > right and her fingers sometimes curl up.     She has an electric like sensation in the left upper arm.   Moving intensifies the dysesthesia.   The fingers have tingling but not actual numbness.    The thumb is involved more than the rest of the hand.    This seemed to come on fairly suddenly a couple weeks ago and she has also noted left arm weakness.   She denies neck pain but has some discomfort -- unchanged since before the new symptoms.     At the last visit, she was very lethargic but has been much better.   She still has some lethargy and sleepiness.   She notes waking up with a snort some times.    She dozes off when in the recliner but not in her wheelchair.     She occasioanlly has visual hallucinations but they are not bothering her much.    Her omeprazole was stopped due to low iron    Update 07/08/2018: Her last JCV Ab was positive at 0.74 so we changed the Tysabri from every 4 weeks to 6 weeks.    Her last infusion was 8 weeks ago as she had an infection and ws on antibiotics a few weeks ago.    She had a respiratory infection last month and was prescribed antibiotics and a a steroid.   She went to the ED and received a new prescription, doxycycline (she just completed).     She had no fever but she perceived that the temperature ws up and down.   She has more hallucinations since the recent  infection.   She took one risperidone but it made her very lethargic.    No coughing, urine is not cloudy.     CXR 11/10 showed some left lower patchiness but no clear pneumonia.      CT scan had shown gallstones.  She is not c/o pain.      Currently, she is very lethargic, more so than typical.   She is weaker in hands...legs always weak.     She is on baclofen 20 mg po qid and dantrolene tid.     Update 03/06/2018: She continues on Tysabri and she tolerates it well.  She feels the MS has been stable.  She has had no exacerbations.  When last checked, she had a low positive JCV antibody implying a 1: 2000 risk of PML after 2 years.  She has severe weakness in her legs associated with spasticity and is in a wheelchair.  She takes baclofen (60-70 mg daily) and dantrolene 50 mg tid to help with  some of the phasic spasm episodes.    She has weakness in her hands and feels strength has improved in her hands on Tysabri.   She also can feed herself better.   She has mild dysphagia but voice is normal.   She has had dysesthesias and they are helped by Trileptal.   Her left vision is reduced and she will sometimes have diplopia looking left.  She feels her bladder function is doing well on Vesicare.  She has constipation helped by Amitiza.      Fatigue has done better on Tysabri.  She usually falls asleep easily but will have sleep maintenance insomnia.  She denies any more issues with hallucinations.  Update 10/31/2017: She feels her MS is stable. .  She has had 2 Tysabri infusions with the first 1 in February and the last one yesterday.  JCV antibody is indeterminate at 0.35 but the inhibition assay was positive.  We have discussed that there is a small risk of PML that could increase the longer she is on the medication.  She does feel that her fatigue has done better since starting Tysabri and she feels that her hands are doing better as well.    She has severe weakness in the legs with spasticity. Spasticity is worse when anxious.    She is taking baclofen 5-6 pills (10 mg each) daily. She took Marinol 2.5 mg but had excessive lethargy and quit.   That has helped her some.  Dysesthesias are much better and she is only on Trileptal now (stopped lamotrigine and amitriptyline).    Bladder function is doing well on Vesicare.  Amitiza helps constipation.   She denies any major problems with her vision but will sometimes have diplopia when she looks to the left.  She has had issues with fatigue though it is better on the Tysabri.  Most nights, she will fall asleep well but will often wake up.  This is worse since her GYN surgery and going through menopause.  She has had some depression that is fairly stable.  In the past she was having visual hallucinations sometimes seeing her husband when he is not around.       Update 07/03/2017:    Her MS is doing about the same.   She does not note any definite new symptoms or exacerbation but she does feel that her arm strength, much worse with her pneumonia and has not returned to baseline. Spasticity is still a problem in  the legs.     For spasticity,she is on baclofen 10 mg po 5-6 pills a day.      She was taking Marinol 2.5 mg bid but felt a little out of it and backed off to just once a day which is more beneficial for her.   Bladder is doing ok on Vesicare.   When she looks to the far left she notes diplopia and she shuts her left eye to avoid.    She notes some fatigue but this is about the same as at the last visit. She is sleeping well most nights but will wake up more if her husbands snoring is loud.    She has depression. He feels this is stable patient does well. She is not having as many hallucinations.  From 02/27/2017: MS:   She is on Aubagio,  she tolerates it well. She has noted some worsening while on the medication clinically. In the past, we have discussed ocrelizumab.  Gait/Strength/sensation:  Legs are doing the same with severe weakness and spasticity.  Dantrolene has not helped much for spasticity.   She takes baclofen 4.5 pills a day and we discussed increasing this and decreasing the dantrolene.   Spasms often occur when she lays down in bed.   She has dysesthetic nerve pain in her legs, helped by amitriptyline and lamotrigine.     In the past, we tried Botox injections into the left arm but  Botox did not improve functioning.   Writing has become more difficulty. She also has had more problems controlling her wheelchair and has had control on her current wheelchair.    Sensation/pain:  She has right anterior shoulder region dysesthesia but this is milder than last year.  Gabapentin was not tolerated.    Lamotrigine 150 mg po bid with amitriptyline helped with dysesthesia at first but less now.   We just went up on the lamotrigine.    Vision:    Vision is about the same.  She has mild decreased acuity and occasional diplopia.        Bladder/Bowel:  She has bladder dysfunction with some benefit form Vesicare.   She more incontinence and always uses Depends.  She is on Linzess due to many of her med's making constipation worse.  She also has ulcerative colitis and requires frequent colonoscopies  Fatigue/sleep:   She has daily fatigue .   She has some insomnia at times.     She has nocturia at night and sleep is variable.    Hallucinations:  Prior to the hospitalization, she would get occasional mild visual hallucinations. These are occurring more. When she was at her worse, the last week of December in the first couple days in January, she had more hallucinations and also had severe confusion.    Mood/Cognition     she notes a mild depression. She has some increased stress with some family issues such as being the executor of her father's will and  having a teenage daughter. There is mild anxiety. Earlier this year, in association with a severe infection requiring hospitalization, she had cognitive issues. Back to baseline and currently she is doing well with mild reduced attention..  MS History:  She presented with a Lhermite's syndrome in 1988 or 1989.   MRIs of the head and neck were performed. They showed lesions consistent with the diagnosis of multiple sclerosis. Lumbar puncture was not necessary. Over the next 15 years, she would get some exacerbations and have courses of  steroids. However, a disease modifying therapy was not prescribed. By 2004, she had difficulty with her gait and would often have to lean on somebody for support.  She started Copaxone that year and stayed on it for about one half years. She stopped due to a lot of itching. She then switched to Rebif. While on Copaxone Rebif she continues to have exacerbations and further difficulties with her gait. About 2012 she switched to [facility] and has been on that medicine since. She  tolerates Aubagio well but is not sure how well it is working for her.   She was off Aubagio x 2-3 weeks in 2013 and felt she did worse.  Last MRI's about 3 years ago.    REVIEW OF SYSTEMS:  Constitutional: No fevers, chills, sweats, or change in appetite.  Has fatigue and poor slep Eyes: see above.   No eye pain Ear, nose and throat: No hearing loss, ear pain, nasal congestion, sore throat Cardiovascular: No chest pain, palpitations Respiratory:  No shortness of breath at rest or with exertion.   No wheezes GastrointestinaI: Has UC - doing well.   No nausea, vomiting, diarrhea, abdominal pain, fecal incontinence Genitourinary:  see above Musculoskeletal:  No neck pain, back pain Integumentary: No rash, pruritus, skin lesions Neurological: as above Psychiatric: as above. Endocrine: No palpitations, diaphoresis, change in appetite, change in weigh or increased thirst Hematologic/Lymphatic:  No anemia, purpura, petechiae. Allergic/Immunologic: No itchy/runny eyes, nasal congestion, recent allergic reactions, rashes  ALLERGIES: Allergies  Allergen Reactions  . Oxycodone-Acetaminophen Other (See Comments)    Other  . Risperidone And Related     Became very lethargic  . Penicillins Rash    Has patient had a PCN reaction causing immediate rash, facial/tongue/throat swelling, SOB or lightheadedness with hypotension: NO Has patient had a PCN reaction causing severe rash involving mucus membranes or skin necrosis: NO Has patient had a PCN reaction that required hospitalization NO Has patient had a PCN reaction occurring within the last 10 years:NO If all of the above answers are "NO", then may proceed with Cephalosporin use.    HOME MEDICATIONS: Outpatient Medications Prior to Visit  Medication Sig Dispense Refill  . acyclovir (ZOVIRAX) 400 MG tablet Take 400 mg by mouth daily. Take every day per patient  0  . AMITIZA 8 MCG capsule Take 8 mcg by mouth 3 (three) times daily. GI MD  prescribing- will take 2 caps BID prn  1  . aspirin EC 81 MG tablet Take 81 mg by mouth daily.    Marland Kitchen b complex vitamins tablet Take 1 tablet by mouth daily.     . baclofen (LIORESAL) 20 MG tablet TAKE 1 TABLET BY MOUTH UP TO 4 TIMES DAILY 360 tablet 4  . cholecalciferol (VITAMIN D) 1000 UNITS tablet Take 1,000 Units by mouth daily.     . dantrolene (DANTRIUM) 50 MG capsule TAKE 1 CAPSULE (50 MG TOTAL) BY MOUTH 3 (THREE) TIMES DAILY. 270 capsule 3  . famotidine (PEPCID) 40 MG tablet Take 1 tablet (40 mg total) by mouth 2 (two) times daily. 60 tablet 11  . FLUoxetine (PROZAC) 10 MG capsule Take 5 capsules (50 mg total) by mouth daily. 094 capsule 0  . folic acid (FOLVITE) 709 MCG tablet Take 400 mcg by mouth daily.     . furosemide (LASIX) 40 MG tablet Take 1 tablet (40 mg total) by mouth 2 (two) times daily. 30 tablet   . ibuprofen (ADVIL,MOTRIN) 200 MG tablet Take 200 mg by mouth  3 (three) times daily.     Marland Kitchen labetalol (NORMODYNE) 100 MG tablet Take 100 mg by mouth daily.    Marland Kitchen lidocaine-prilocaine (EMLA) cream Apply one inch before port-a cath access prn 30 g 3  . mesalamine (PENTASA) 500 MG CR capsule Take 1,000 mg by mouth 3 (three) times daily.     . Multiple Vitamin (MULTIVITAMIN) tablet Take 1 tablet by mouth daily.    . natalizumab (TYSABRI) 300 MG/15ML injection Inject 300 mg into the vein every 6 (six) weeks.     . OXcarbazepine (TRILEPTAL) 150 MG tablet TAKE 1 TABLET BY MOUTH 3 TIMES A DAY 270 tablet 3  . QUEtiapine (SEROQUEL) 25 MG tablet Take 1 tablet (25 mg total) by mouth at bedtime. 30 tablet 0  . sodium chloride (OCEAN) 0.65 % SOLN nasal spray Place 1 spray into both nostrils as needed for congestion.  0  . tolterodine (DETROL) 2 MG tablet Take 2 mg by mouth 2 (two) times daily.   1  . hydrOXYzine (ATARAX/VISTARIL) 25 MG tablet Take 1 tablet (25 mg total) by mouth every 8 (eight) hours as needed. 90 tablet 5  . QUEtiapine (SEROQUEL) 25 MG tablet Take 0.5 tablets (12.5 mg total) by  mouth daily as needed (anxiety). 60 tablet 0  . Armodafinil 200 MG TABS 1/2 to one po qAM (Patient not taking: Reported on 06/24/2019) 30 tablet 5  . methylPREDNISolone (MEDROL) 4 MG tablet Taper from 6 pills po for one day to 1 pill po the last day over 6 days 21 tablet 0   No facility-administered medications prior to visit.     PAST MEDICAL HISTORY: Past Medical History:  Diagnosis Date  . Headache   . History of kidney stones   . Hypertension   . Kidney stones   . Movement disorder   . Multiple sclerosis ([place])   . Neuropathy   . Ulcerative colitis ([place])   . Vision abnormalities     PAST SURGICAL HISTORY: Past Surgical History:  Procedure Laterality Date  . ENDOMETRIAL ABLATION  10/10/2017  . KIDNEY STONE SURGERY    . PORTACATH PLACEMENT N/A 10/16/2017   Procedure: ULTRA SOUND GUIDED INSERTION PORT-A-CATH ERAS PATHWAY;  Surgeon: Kieth Brightly Arta Bruce, MD;  Location: [facility];  Service: General;  Laterality: N/A;    FAMILY HISTORY: Family History  Problem Relation Age of Onset  . Dementia Mother   . Hypertension Father   . Hyperlipidemia Father   . Diabetes Father   . Heart disease Father   . Breast cancer Neg Hx     SOCIAL HISTORY:  Social History   Socioeconomic History  . Marital status: Married    Spouse name: Not on file  . Number of children: Not on file  . Years of education: Not on file  . Highest education level: Not on file  Occupational History  . Not on file  Social Needs  . Financial resource strain: Not on file  . Food insecurity    Worry: Not on file    Inability: Not on file  . Transportation needs    Medical: Not on file    Non-medical: Not on file  Tobacco Use  . Smoking status: Never Smoker  . Smokeless tobacco: Never Used  Substance and Sexual Activity  . Alcohol use: Yes    Comment: occasional glass of wine  . Drug use: No  . Sexual activity: Not on file  Lifestyle  . Physical activity    Days per week: Not  on file    Minutes  per session: Not on file  . Stress: Not on file  Relationships  . Social Herbalist on phone: Not on file    Gets together: Not on file    Attends religious service: Not on file    Active member of club or organization: Not on file    Attends meetings of clubs or organizations: Not on file    Relationship status: Not on file  . Intimate partner violence    Fear of current or ex partner: Not on file    Emotionally abused: Not on file    Physically abused: Not on file    Forced sexual activity: Not on file  Other Topics Concern  . Not on file  Social History Narrative  . Not on file     PHYSICAL EXAM  Vitals:   06/24/19 1538  BP: 132/73  Pulse: 61  Temp: 98.2 F (36.8 C)  Height: 5' 3"  (1.6 m)    Body mass index is 31 kg/m.   General: The patient is well-developed and well-nourished and in no acute distress  Skin: Extremities are without rash.  Changes in and around 5th digit nail on the right.       Neurologic Exam  Mental status: The patient is lethargic and oriented x 2 at the time of the examination.  Focus and attention was reduced.  Speech is normal.  Cranial nerves: Extraocular movements are full.   Facial strength and sensation is normal.  Trapezius strength is normal.  There is no dysarthria.  No obvious hearing deficits are noted.  Motor:  Muscle bulk is normal .  Muscle tone is increased in the legs, right greater than left.  She has increased muscle tone in the right arm   She has no definite strength in the legs.. Strength is 2-/5 in the left arm and 2/5 in the right arm. Tone is increased in the legs, right greater than left.  She also has increased tone in the arms   Sensory: She appears to have symmetric vibration sensation in the arms and reduced vibration sensation at the knees.  Gait and station: She can not stand or walk.    Reflexes: She has increased reflexes in her legs.    DIAGNOSTIC DATA (LABS, IMAGING, TESTING) - I reviewed  patient records, labs, notes, testing and imaging myself where available.  Lab Results  Component Value Date   WBC 10.1 12/31/2018   HGB 12.3 12/31/2018   HCT 35.7 12/31/2018   MCV 94 12/31/2018   PLT 135 (L) 12/31/2018      Component Value Date/Time   NA 150 (H) 10/07/2018 1459   K 3.5 10/07/2018 1459   CL 108 (H) 10/07/2018 1459   CO2 26 10/07/2018 1459   GLUCOSE 101 (H) 10/07/2018 1459   GLUCOSE 94 07/30/2018 0550   BUN 20 10/07/2018 1459   CREATININE 0.83 10/07/2018 1459   CALCIUM 8.6 (L) 10/07/2018 1459   PROT 6.0 10/07/2018 1459   ALBUMIN 4.2 10/07/2018 1459   AST 19 10/07/2018 1459   ALT 34 (H) 10/07/2018 1459   ALKPHOS 98 10/07/2018 1459   BILITOT 0.3 10/07/2018 1459   GFRNONAA 81 10/07/2018 1459   GFRAA 93 10/07/2018 1459       ASSESSMENT AND PLAN  Multiple sclerosis (HCC) - Plan: CBC with Differential/Platelets, Stratify JCV Antibody Test ([facility]), Hepatic Function Panel, Ambulatory referral to Physical Therapy  High risk medication use - Plan:  CBC with Differential/Platelets, Stratify JCV Antibody Test ([facility]), Hepatic Function Panel  Spastic quadriparesis ([place]) - Plan: Ambulatory referral to Physical Therapy   1.   Continue Tysabri for active SPMS.  We had a conversation at her request about switching to [place].  Although theoretically it is possible that she could do better with Ocrevus and Tysabri, I am concerned that her risks might be quite a bit more with Ocrevus due to the higher possibility of respiratory infections, especially during Covid.  Even without COVID-19, she did have pneumonia twice in the last few years.  Therefore, at least I would like her to hold off till spring and will read discussed with her that time.  I will check blood work.  If she does convert to high positive JCV antibody we will need to consider a different disease modifying therapy. 2.   Try to stay active as possible. 3.   Referral to physical therapy. 4.   Continue other  medications. Jonas Goh A. Felecia Shelling, MD, PhD 06/10/2535, 64:40 PM Certified in Neurology, Clinical Neurophysiology, Sleep Medicine, Pain Medicine and Neuroimaging  [facility] [address], [place], [place] [address] [phone]

## 2019-06-24 NOTE — Telephone Encounter (Signed)
Placed JCV lab in [facility] lock box for routine lab pick up. Results pending. 

## 2019-06-25 ENCOUNTER — Other Ambulatory Visit: Payer: Self-pay | Admitting: Neurology

## 2019-06-25 DIAGNOSIS — Z79899 Other long term (current) drug therapy: Secondary | ICD-10-CM

## 2019-06-25 DIAGNOSIS — G35 Multiple sclerosis: Secondary | ICD-10-CM

## 2019-06-25 LAB — CBC WITH DIFFERENTIAL/PLATELET
Basophils Absolute: 0.1 10*3/uL (ref 0.0–0.2)
Basos: 1 %
EOS (ABSOLUTE): 0.7 10*3/uL — ABNORMAL HIGH (ref 0.0–0.4)
Eos: 9 %
Hematocrit: 35.5 % (ref 34.0–46.6)
Hemoglobin: 12 g/dL (ref 11.1–15.9)
Immature Grans (Abs): 0 10*3/uL (ref 0.0–0.1)
Immature Granulocytes: 0 %
Lymphocytes Absolute: 1.6 10*3/uL (ref 0.7–3.1)
Lymphs: 22 %
MCH: 31.7 pg (ref 26.6–33.0)
MCHC: 33.8 g/dL (ref 31.5–35.7)
MCV: 94 fL (ref 79–97)
Monocytes Absolute: 0.8 10*3/uL (ref 0.1–0.9)
Monocytes: 11 %
Neutrophils Absolute: 4.2 10*3/uL (ref 1.4–7.0)
Neutrophils: 57 %
Platelets: 101 10*3/uL — ABNORMAL LOW (ref 150–450)
RBC: 3.78 x10E6/uL (ref 3.77–5.28)
RDW: 13.6 % (ref 11.7–15.4)
WBC: 7.4 10*3/uL (ref 3.4–10.8)

## 2019-06-25 LAB — HEPATIC FUNCTION PANEL
ALT: 22 IU/L (ref 0–32)
AST: 14 IU/L (ref 0–40)
Albumin: 4.5 g/dL (ref 3.8–4.9)
Alkaline Phosphatase: 85 IU/L (ref 39–117)
Bilirubin Total: 0.3 mg/dL (ref 0.0–1.2)
Bilirubin, Direct: 0.1 mg/dL (ref 0.00–0.40)
Total Protein: 6 g/dL (ref 6.0–8.5)

## 2019-06-26 ENCOUNTER — Telehealth: Payer: Self-pay | Admitting: *Deleted

## 2019-06-26 DIAGNOSIS — G35 Multiple sclerosis: Secondary | ICD-10-CM

## 2019-06-26 DIAGNOSIS — G8250 Quadriplegia, unspecified: Secondary | ICD-10-CM

## 2019-06-26 DIAGNOSIS — Z993 Dependence on wheelchair: Secondary | ICD-10-CM

## 2019-06-26 NOTE — Telephone Encounter (Signed)
Spoke with patient and informed her that in her labs the platelet count was a little bit low. The liver function tests were fine. Dr Felecia Shelling wants to check the CBC again in a few weeks. I advised he placed a future order. He stated that she is going to do physical therapy and if she does PT in neuro rehab we can just check it when she comes in. She stated that she had mentioned she did not want PT in neuro rehab; she wants PT in her home. She also stated that her lab needs to be drawn from her port. I advised I'll let Dr Felecia Shelling know she wants in home PT.  I requested she call or send my chart a few days before she plans to come in for lab draw, so the infusion nurses can be advised. She verbalized understanding, appreciation.

## 2019-06-26 NOTE — Telephone Encounter (Signed)
Referral placed for in home physical therapy. Dx: MS, wheelchair bound, spastic quadriparesis.

## 2019-06-26 NOTE — Telephone Encounter (Signed)
Yes, lets go ahead and do this

## 2019-07-04 ENCOUNTER — Other Ambulatory Visit: Payer: Self-pay

## 2019-07-04 ENCOUNTER — Ambulatory Visit
Admission: RE | Admit: 2019-07-04 | Discharge: 2019-07-04 | Disposition: A | Discharge status: Home / Self Care | Payer: Commercial Managed Care - PPO | Source: Ambulatory Visit | Attending: Family Medicine | Admitting: Family Medicine

## 2019-07-04 DIAGNOSIS — Z1231 Encounter for screening mammogram for malignant neoplasm of breast: Secondary | ICD-10-CM

## 2019-07-07 NOTE — Telephone Encounter (Signed)
JCV result received. Index value was 0.49 and JCV antibody was positive. Paper fax given to MD to review.

## 2019-07-09 NOTE — Telephone Encounter (Signed)
Dr. Felecia Shelling requesting patient be contacted and informed that her JCV test is low positive, similar to last time.  I called her and left a detailed message on her voicemail (ok per DPR).  Provided our number to call back with any questions.

## 2019-07-14 ENCOUNTER — Other Ambulatory Visit: Payer: Self-pay

## 2019-07-14 DIAGNOSIS — G35 Multiple sclerosis: Secondary | ICD-10-CM

## 2019-07-14 DIAGNOSIS — Z79899 Other long term (current) drug therapy: Secondary | ICD-10-CM

## 2019-07-15 ENCOUNTER — Telehealth: Payer: Self-pay | Admitting: *Deleted

## 2019-07-15 LAB — CBC WITH DIFFERENTIAL/PLATELET
Basophils Absolute: 0.1 10*3/uL (ref 0.0–0.2)
Basos: 1 %
EOS (ABSOLUTE): 0.7 10*3/uL — ABNORMAL HIGH (ref 0.0–0.4)
Eos: 8 %
Hematocrit: 36.9 % (ref 34.0–46.6)
Hemoglobin: 12.3 g/dL (ref 11.1–15.9)
Immature Grans (Abs): 0 10*3/uL (ref 0.0–0.1)
Immature Granulocytes: 1 %
Lymphocytes Absolute: 1.9 10*3/uL (ref 0.7–3.1)
Lymphs: 23 %
MCH: 32.1 pg (ref 26.6–33.0)
MCHC: 33.3 g/dL (ref 31.5–35.7)
MCV: 96 fL (ref 79–97)
Monocytes Absolute: 0.7 10*3/uL (ref 0.1–0.9)
Monocytes: 8 %
NRBC: 1 % — ABNORMAL HIGH (ref 0–0)
Neutrophils Absolute: 5 10*3/uL (ref 1.4–7.0)
Neutrophils: 59 %
Platelets: 126 10*3/uL — ABNORMAL LOW (ref 150–450)
RBC: 3.83 x10E6/uL (ref 3.77–5.28)
RDW: 14 % (ref 11.7–15.4)
WBC: 8.4 10*3/uL (ref 3.4–10.8)

## 2019-07-15 NOTE — Telephone Encounter (Signed)
-----   Message from Britt Bottom, MD sent at 07/15/2019  9:29 AM EST ----- Please let her know that the platelet count looks better.  It is now just a little bit low and higher than it was 3 weeks ago.

## 2019-09-01 ENCOUNTER — Telehealth: Payer: Self-pay | Admitting: *Deleted

## 2019-09-01 NOTE — Telephone Encounter (Signed)
Received fax from touch prescribing program for Tysabri that pt re-authorized from 09/01/19-04/03/2020. Pt enrollment # [id number]. Account: [facility]. Site auth # [id number].

## 2019-09-01 NOTE — Telephone Encounter (Signed)
Faxed completed/signed Tysabri pt status report and reauth questionnaire to [facility] at [phone]. Received confirmation.  

## 2019-09-15 ENCOUNTER — Telehealth: Payer: Self-pay | Admitting: Neurology

## 2019-09-15 NOTE — Telephone Encounter (Signed)
I have it I had to call her Christella Scheuermann ID [id number] Group [id number] . [phone]

## 2019-09-18 ENCOUNTER — Telehealth: Payer: Self-pay | Admitting: Neurology

## 2019-09-18 DIAGNOSIS — Z95828 Presence of other vascular implants and grafts: Secondary | ICD-10-CM

## 2019-09-18 MED ORDER — LIDOCAINE-PRILOCAINE 2.5-2.5 % EX CREA: TOPICAL_CREAM | CUTANEOUS | 3 refills | Status: DC

## 2019-09-18 NOTE — Telephone Encounter (Signed)
Pt called wanting to know if it will be ok for her to get the COVID Vaccine and she also wanted to ask if she can get more numbing cream for the port for her infusions. Please advise.

## 2019-09-18 NOTE — Telephone Encounter (Signed)
Called pt back. Advised per Dr. Felecia Shelling that she can get either moderna or pfizer covid-19 vaccine.  Advised we will send in emla cream for her to [facility]. She verbalized understanding.

## 2019-11-02 ENCOUNTER — Other Ambulatory Visit: Payer: Self-pay | Admitting: Neurology

## 2019-11-24 ENCOUNTER — Encounter (HOSPITAL_BASED_OUTPATIENT_CLINIC_OR_DEPARTMENT_OTHER): Payer: Medicare Other | Admitting: Internal Medicine

## 2019-11-25 ENCOUNTER — Other Ambulatory Visit: Payer: Self-pay | Admitting: *Deleted

## 2019-11-25 ENCOUNTER — Telehealth: Payer: Self-pay | Admitting: *Deleted

## 2019-11-25 ENCOUNTER — Telehealth: Payer: Self-pay | Admitting: Neurology

## 2019-11-25 DIAGNOSIS — Z79899 Other long term (current) drug therapy: Secondary | ICD-10-CM

## 2019-11-25 DIAGNOSIS — G35 Multiple sclerosis: Secondary | ICD-10-CM

## 2019-11-25 NOTE — Progress Notes (Signed)
Pt here for infusion today with intrafusion. She is due soon for labs. She will complete today, orders placed. She also did not have a 6 month f/u. Scheduled one with Dr. Felecia Shelling for 12/22/19 at 1:30pm. Gave pt appt card as a reminder.

## 2019-11-25 NOTE — Telephone Encounter (Signed)
Placed JCV lab in [facility] lock box for routine lab pick up. Results pending. 

## 2019-11-25 NOTE — Telephone Encounter (Signed)
She is noting more weakness, especially in the left arm.  In general, the left arm is weaker than the right arm.  However she is having more difficulty using it over the last few weeks.  Her caretaker has noted some bumps and possible mild skin breakdown in the sacral area and she is scheduled to see the wound center early next week.  I discussed with her that infections including skin breakdown can make a person with MS feel weaker and it is important for her to follow through with the appointment.  Unfortunately, there is no medicine that would help with the strength.  She is currently on Tysabri.  She has not had any exacerbations while on it but has had some continued slow progression.  We discussed that Ocrevus would likely be similar but have a higher risk of infection

## 2019-11-25 NOTE — Addendum Note (Signed)
Addended by: Inis Sizer D on: 11/25/2019 02:30 PM   Modules accepted: Orders

## 2019-11-26 LAB — CBC WITH DIFFERENTIAL/PLATELET
Basophils Absolute: 0.1 10*3/uL (ref 0.0–0.2)
Basos: 1 %
EOS (ABSOLUTE): 0.6 10*3/uL — ABNORMAL HIGH (ref 0.0–0.4)
Eos: 6 %
Hematocrit: 36.1 % (ref 34.0–46.6)
Hemoglobin: 12.1 g/dL (ref 11.1–15.9)
Immature Grans (Abs): 0.1 10*3/uL (ref 0.0–0.1)
Immature Granulocytes: 1 %
Lymphocytes Absolute: 2 10*3/uL (ref 0.7–3.1)
Lymphs: 23 %
MCH: 32.4 pg (ref 26.6–33.0)
MCHC: 33.5 g/dL (ref 31.5–35.7)
MCV: 97 fL (ref 79–97)
Monocytes Absolute: 0.9 10*3/uL (ref 0.1–0.9)
Monocytes: 10 %
Neutrophils Absolute: 5.1 10*3/uL (ref 1.4–7.0)
Neutrophils: 59 %
Platelets: 144 10*3/uL — ABNORMAL LOW (ref 150–450)
RBC: 3.74 x10E6/uL — ABNORMAL LOW (ref 3.77–5.28)
RDW: 13.1 % (ref 11.7–15.4)
WBC: 8.7 10*3/uL (ref 3.4–10.8)

## 2019-12-02 ENCOUNTER — Encounter (HOSPITAL_BASED_OUTPATIENT_CLINIC_OR_DEPARTMENT_OTHER): Payer: Managed Care, Other (non HMO) | Attending: Internal Medicine | Admitting: Internal Medicine

## 2019-12-02 ENCOUNTER — Other Ambulatory Visit: Payer: Self-pay

## 2019-12-02 DIAGNOSIS — G8250 Quadriplegia, unspecified: Secondary | ICD-10-CM | POA: Diagnosis not present

## 2019-12-02 DIAGNOSIS — I110 Hypertensive heart disease with heart failure: Secondary | ICD-10-CM | POA: Insufficient documentation

## 2019-12-02 DIAGNOSIS — L89150 Pressure ulcer of sacral region, unstageable: Secondary | ICD-10-CM | POA: Diagnosis not present

## 2019-12-02 DIAGNOSIS — Z993 Dependence on wheelchair: Secondary | ICD-10-CM | POA: Insufficient documentation

## 2019-12-02 DIAGNOSIS — G35 Multiple sclerosis: Secondary | ICD-10-CM | POA: Diagnosis not present

## 2019-12-02 DIAGNOSIS — I509 Heart failure, unspecified: Secondary | ICD-10-CM | POA: Insufficient documentation

## 2019-12-02 NOTE — Progress Notes (Signed)
[facility], [place] ([id number]) Visit Report for 12/02/2019 Abuse/Suicide Risk Screen Details Patient Name: Vanessa Short, Vanessa Short 12/02/2019 2:45 Date of Service: PM Medical Record [id number] Number: Patient Account Number: [id number] Date of Birth/Sex: Treating RN: July 31, 1966 (54 y.o. Nancy Fetter Other Clinician: [place]MER[place], Primary Care Alaira Level: CORNERSTONE Treating Linton Ham Jaedyn Marrufo/Extender: [place], Referring Pavielle Biggar: CORNERSTONE Weeks in Treatment: 0 Abuse/Suicide Risk Screen Items Answer ABUSE RISK SCREEN: Has anyone close to you tried to hurt or harm you recentlyo No Do you feel uncomfortable with anyone in your familyo No Has anyone forced you do things that you didnt want to doo No Electronic Signature(s) Signed: 12/02/2019 6:13:01 PM By: Levan Hurst RN, BSN Entered By: Levan Hurst on 12/02/2019 15:41:34 -------------------------------------------------------------------------------- Activities of Daily Living Details Patient Name: [facility] 12/02/2019 2:45 Date of Service: PM Medical Record [id number] Number: Patient Account Number: [id number] Date of Birth/Sex: Treating RN: 06/28/1966 (54 y.o. Nancy Fetter Other Clinician: Primary Care Jaedin Trumbo: Sharmaine Base, CORNERSTONE Treating Linton Ham Dmario Russom/Extender: [place], Referring Margree Gimbel: CORNERSTONE Weeks in Treatment: 0 Activities of Daily Living Items Answer Activities of Daily Living (Please select one for each item) Drive Automobile Not Able Take Medications Need Assistance Use Telephone Need Assistance Care for Appearance Need Assistance Use Toilet Need Assistance Bath / Shower Need Assistance Dress Self Need Assistance Feed Self Need Assistance Walk Not Able Get In / Out Bed Need Assistance Housework Need Assistance Prepare Meals Need Assistance Handle Money Need Assistance Shop for Self Need Assistance Electronic Signature(s) Signed: 12/02/2019 6:13:01 PM By:  Levan Hurst RN, BSN Entered By: Levan Hurst on 12/02/2019 15:42:18 -------------------------------------------------------------------------------- Education Screening Details Patient Name: [facility] 12/02/2019 2:45 Date of Service: PM Medical Record [id number] Number: Patient Account Number: [id number] Date of Birth/Sex: Treating RN: August 01, 1966 (54 y.o. Nancy Fetter Other Clinician: Primary Care Katryna Tschirhart: Sharmaine Base, CORNERSTONE Treating Linton Ham Kenika Sahm/Extender: [place], Referring Hawkins Seaman: CORNERSTONE Weeks in Treatment: 0 Primary Learner Assessed: Patient Learning Preferences/Education Level/Primary Language Learning Preference: Explanation, Demonstration, Printed Material Highest Education Level: College or Above Preferred Language: English Cognitive Barrier Language Barrier: No Translator Needed: No Memory Deficit: No Emotional Barrier: No Cultural/Religious Beliefs Affecting Medical Care: No Physical Barrier Impaired Vision: No Impaired Hearing: No Decreased Hand dexterity: No Knowledge/Comprehension Knowledge Level: High Comprehension Level: High Ability to understand written Ability to understand written High instructions: Ability to understand verbal High instructions: Motivation Anxiety Level: Calm Cooperation: Cooperative Education Importance: Acknowledges Need Interest in Health Problems: Asks Questions Perception: Coherent Willingness to Engage in Self- High Management Activities: Readiness to Engage in Self- High Management Activities: Electronic Signature(s) Signed: 12/02/2019 6:13:01 PM By: Levan Hurst RN, BSN Entered By: Levan Hurst on 12/02/2019 15:42:36 -------------------------------------------------------------------------------- Fall Risk Assessment Details Patient Name: [name] [place] 12/02/2019 2:45 Date of Service: PM Medical Record [id number] Number: Patient Account Number: [id number] Date  of Birth/Sex: Treating RN: Jan 06, 1966 (54 y.o. Nancy Fetter Other Clinician: Primary Care Sunny Gains: Sharmaine Base, CORNERSTONE Treating Linton Ham Merlinda Wrubel/Extender: [place], Referring Franziska Podgurski: CORNERSTONE Weeks in Treatment: 0 Fall Risk Assessment Items Have you had 2 or more falls in the last 12 monthso 0 No Have you had any fall that resulted in injury in the last 12 monthso 0 No FALLS RISK SCREEN History of falling - immediate or within 3 months 0 No Secondary diagnosis (Do you have 2 or more medical diagnoseso) 15 Yes Ambulatory aid None/bed rest/wheelchair/nurse 0 Yes Crutches/cane/walker 0 No Furniture 0 No Intravenous therapy Access/Saline/Heparin Lock 0 No Weak (short steps with or without shuffle, stooped but able to lift head 0 No while  walking, may seek support from furniture) Impaired (short steps with shuffle, may have difficulty arising from chair, 0 No head down, impaired balance) Mental Status Oriented to own ability 0 Yes Overestimates or forgets limitations 0 No Risk Level: Low Risk Score: 15 Electronic Signature(s) Signed: 12/02/2019 6:13:01 PM By: Levan Hurst RN, BSN Entered By: Levan Hurst on 12/02/2019 15:42:51 -------------------------------------------------------------------------------- Nutrition Risk Screening Details Patient Name: [facility] 12/02/2019 2:45 Date of Service: PM Medical Record [id number] Number: Patient Account Number: [id number] Date of Birth/Sex: Treating RN: Feb 04, 1966 (54 y.o. Nancy Fetter Other Clinician: SUMMERFIELD, Primary Care Danell Vazquez: CORNERSTONE Treating Linton Ham Takeshi Teasdale/Extender: [place], Referring Raynesha Tiedt: CORNERSTONE Weeks in Treatment: 0 Height (in): 63 Weight (lbs): 160 Body Mass Index (BMI): 28.3 Nutrition Risk Screening Items Score Screening NUTRITION RISK SCREEN: I have an illness or condition that made me change the kind and/or 0 No amount of food I  eat I eat fewer than two meals per day 0 No I eat few fruits and vegetables, or milk products 0 No I have three or more drinks of beer, liquor or wine almost every day 0 No I have tooth or mouth problems that make it hard for me to eat 0 No I don't always have enough money to buy the food I need 0 No I eat alone most of the time 0 No I take three or more different prescribed or over-the-counter drugs a day 1 Yes 0 No Without wanting to, I have lost or gained 10 pounds in the last six months I am not always physically able to shop, cook and/or feed myself 2 Yes Nutrition Protocols Good Risk Protocol Provide education on Moderate Risk Protocol 0 nutrition High Risk Proctocol Risk Level: Moderate Risk Score: 3 Electronic Signature(s) Signed: 12/02/2019 6:13:01 PM By: Levan Hurst RN, BSN Entered By: Levan Hurst on 12/02/2019 15:43:04

## 2019-12-02 NOTE — Telephone Encounter (Signed)
Called [facility] at [phone] account# [id number] to check on status of results. Spoke with [employer]. She states test results are still pending.

## 2019-12-03 NOTE — Progress Notes (Signed)
[facility], [place] ([id number]) Visit Report for 12/02/2019 Chief Complaint Document Details Patient Name: Vanessa Short, Vanessa Short 12/02/2019 2:45 Date of Service: PM Medical Record [id number] Number: Patient Account Number: [id number] Date of Birth/Sex: Treating RN: Jan 05, 1966 (54 y.o. Vanessa Short Other Clinician: SUMMERFIELD, Primary Care Provider: CORNERSTONE Treating Linton Ham Provider/Extender: [place], Referring Provider: CORNERSTONE Weeks in Treatment: 0 Information Obtained from: Patient Chief Complaint 12/02/2019; patient was sent here for review of potential decubitus ulcers by primary care. Electronic Signature(s) Signed: 12/02/2019 5:47:38 PM By: Linton Ham MD Entered By: Linton Ham on 12/02/2019 16:54:06 -------------------------------------------------------------------------------- HPI Details Patient Name: [name] 12/02/2019 2:45 Date of Service: PM Medical Record [id number] Number: Patient Account Number: [id number] Date of Birth/Sex: Treating RN: 1966/03/05 (55 y.o. Vanessa Short Other Clinician: Primary Care Provider: Sharmaine Base, CORNERSTONE Treating Linton Ham Provider/Extender: [place], Referring Provider: CORNERSTONE Weeks in Treatment: 0 History of Present Illness HPI Description: ADMISSION 12/02/2019 This is a 54 year old woman with advanced multiple sclerosis. She is nonambulatory wheelchair dependent. She sleeps in a recliner at night. Apparently for the last 3 months or so her husband has noticed raised hyperkeratotic areas on her sacrum and mid buttock area left greater than right. They were seen last week by primary care felt to have possible decubitus ulcers and was sent here for review. She also has an appointment with dermatology next week. They were prescribed a course of Duricef and Bactroban which they have been using. I am really not sure about her wound history she says she may have had wounds in the past  but is not really sure when. Past medical history; advanced multiple sclerosis, hypertension, CHF greater trochanter fracture Electronic Signature(s) Signed: 12/02/2019 5:47:38 PM By: Linton Ham MD Entered By: Linton Ham on 12/02/2019 16:56:23 -------------------------------------------------------------------------------- Physical Exam Details Patient Name: [name] 12/02/2019 2:45 Date of Service: PM Medical Record [id number] Number: Patient Account Number: [id number] Date of Birth/Sex: Treating RN: 07/19/1966 (54 y.o. Vanessa Short Other Clinician: Primary Care Provider: Sharmaine Base, CORNERSTONE Treating Linton Ham Provider/Extender: [place], Referring Provider: CORNERSTONE Weeks in Treatment: 0 Constitutional Patient is hypertensive.. Pulse regular and within target range for patient.Marland Kitchen Respirations regular, non-labored and within target range.. Temperature is normal and within the target range for the patient.Marland Kitchen Appears in no distress. Integumentary (Hair, Skin) Skin and subcutaneous tissue without rashes, lesions. Psychiatric appears at normal baseline. Notes Wound exam; the patient has no open wound. On her upper buttocks bilaterally and coccyx area there is raised hyperkeratotic nodular areas. These have length. The skin between over the coccyx I do not think it is normal either. She has similar areas on the left greater than the right in the mid buttock area. There is no surrounding erythema here. No evidence of fungal or bacterial infection. Electronic Signature(s) Signed: 12/02/2019 5:47:38 PM By: Linton Ham MD Entered By: Linton Ham on 12/02/2019 16:58:21 -------------------------------------------------------------------------------- Physician Orders Details Patient Name: [name] 12/02/2019 2:45 Date of Service: PM Medical Record [id number] Number: Patient Account Number: [id number] Date of Birth/Sex: Treating  RN: June 01, 1966 (54 y.o. Vanessa Short Other Clinician: SUMMER[place], Primary Care Provider: CORNERSTONE Treating Linton Ham Provider/Extender: [place], Referring Provider: CORNERSTONE Weeks in Treatment: 0 Verbal / Phone Orders: No Diagnosis Coding Discharge From [facility] Discharge from [place] - follow up with dermatology Electronic Signature(s) Signed: 12/02/2019 5:39:56 PM By: Carlene Coria RN Signed: 12/02/2019 5:47:38 PM By: Linton Ham MD Entered By: Carlene Coria on 12/02/2019 16:06:55 -------------------------------------------------------------------------------- Problem List Details Patient Name: [name] 12/02/2019 2:45 Date of Service: PM Medical  Record [id number] Number: Patient Account Number: [id number] Date of Birth/Sex: Treating RN: [birth year] (54 y.o. Vanessa Short Other Clinician: Primary Care Provider: Sharmaine Base, CORNERSTONE Treating Linton Ham Provider/Extender: [place], Referring Provider: CORNERSTONE Weeks in Treatment: 0 Active Problems ICD-10 Evaluated Encounter Code Description Active Date Today Diagnosis L89.150 Pressure ulcer of sacral region, unstageable 12/02/2019 No Yes B07.9 Viral wart, unspecified 12/02/2019 No Yes G35 Multiple sclerosis 12/02/2019 No Yes Inactive Problems Resolved Problems Electronic Signature(s) Signed: 12/02/2019 5:47:38 PM By: Linton Ham MD Entered By: Linton Ham on 12/02/2019 16:56:52 -------------------------------------------------------------------------------- Progress Note Details Patient Name: [name] 12/02/2019 2:45 Date of Service: PM Medical Record [id number] Number: Patient Account Number: [id number] Date of Birth/Sex: Treating RN: [birth year] (54 y.o. Vanessa Short Other Clinician: SUMMERFIELD, Primary Care Provider: CORNERSTONE Treating Linton Ham Provider/Extender: [place], Referring Provider: CORNERSTONE Weeks in  Treatment: 0 Subjective Chief Complaint Information obtained from Patient 12/02/2019; patient was sent here for review of potential decubitus ulcers by primary care. History of Present Illness (HPI) ADMISSION 12/02/2019 This is a 54 year old woman with advanced multiple sclerosis. She is nonambulatory wheelchair dependent. She sleeps in a recliner at night. Apparently for the last 3 months or so her husband has noticed raised hyperkeratotic areas on her sacrum and mid buttock area left greater than right. They were seen last week by primary care felt to have possible decubitus ulcers and was sent here for review. She also has an appointment with dermatology next week. They were prescribed a course of Duricef and Bactroban which they have been using. I am really not sure about her wound history she says she may have had wounds in the past but is not really sure when. Past medical history; advanced multiple sclerosis, hypertension, CHF greater trochanter fracture Patient History Information obtained from Patient. Allergies risperidone (Severity: Severe, Reaction: hallucinations), Percocet (Severity: Severe, Reaction: hallucinations), penicillin (Severity: Moderate, Reaction: hives) Family History Unknown History. Social History Never smoker, Marital Status - Married, Alcohol Use - Rarely, Drug Use - No History, Caffeine Use - Moderate - Soda. Medical History Hematologic/Lymphatic Patient has history of Anemia - normocytic Cardiovascular Patient has history of Congestive Heart Failure, Hypertension Neurologic Patient has history of Quadriplegia - Spastic Medical And Surgical History Notes Gastrointestinal Ulcerative colitis Neurologic Multiple Sclerosis Psychiatric Major Depressive Disorder Review of Systems (ROS) Constitutional Symptoms (General Health) Denies complaints or symptoms of Fatigue, Fever, Chills, Marked Weight Change. Eyes Complains or has symptoms of Glasses /  Contacts - glasses. Ear/Nose/Mouth/Throat Denies complaints or symptoms of Chronic sinus problems or rhinitis. Respiratory Denies complaints or symptoms of Chronic or frequent coughs, Shortness of Breath. Gastrointestinal Denies complaints or symptoms of Frequent diarrhea, Nausea, Vomiting. Endocrine Denies complaints or symptoms of Heat/cold intolerance. Genitourinary Denies complaints or symptoms of Frequent urination. Integumentary (Skin) Denies complaints or symptoms of Wounds. Musculoskeletal Denies complaints or symptoms of Muscle Pain, Muscle Weakness. Objective Constitutional Patient is hypertensive.. Pulse regular and within target range for patient.Marland Kitchen Respirations regular, non-labored and within target range.. Temperature is normal and within the target range for the patient.Marland Kitchen Appears in no distress. Vitals Time Taken: 3:10 PM, Height: 63 in, Source: Stated, Weight: 160 lbs, Source: Stated, BMI: 28.3, Temperature: 98.0 F, Pulse: 59 bpm, Respiratory Rate: 18 breaths/min, Blood Pressure: 155/79 mmHg. Psychiatric appears at normal baseline. General Notes: Wound exam; the patient has no open wound. On her upper buttocks bilaterally and coccyx area there is raised hyperkeratotic nodular areas. These have length. The skin between over the coccyx I do not think it is normal  either. She has similar areas on the left greater than the right in the mid buttock area. There is no surrounding erythema here. No evidence of fungal or bacterial infection. Integumentary (Hair, Skin) Skin and subcutaneous tissue without rashes, lesions. Assessment Active Problems ICD-10 Pressure ulcer of sacral region, unstageable Viral wart, unspecified Multiple sclerosis Plan Discharge From [facility] Services: Discharge from [place] - follow up with dermatology 1. The patient does not have an open wound, she does not need to be followed here. 2. She has an appointment with dermatology next week  they have pictures on their phone to show the dermatologist. 3. I wondered whether this was a wart. The question would be whether this needs to be biopsied or empirically treated with salicylic acid. I would be interested in the dermatologist opinions 4. There is certainly no evidence of infection and she does not have a decubitus ulcer. Electronic Signature(s) Signed: 12/02/2019 5:47:38 PM By: Linton Ham MD Entered By: Linton Ham on 12/02/2019 17:00:04 -------------------------------------------------------------------------------- HxROS Details Patient Name: [facility] 12/02/2019 2:45 Date of Service: PM Medical Record [id number] Number: Patient Account Number: [id number] Date of Birth/Sex: Treating RN: [birth year] (54 y.o. Nancy Fetter Other Clinician: SUMMERFIELD, Primary Care Provider: CORNERSTONE Treating Linton Ham Provider/Extender: [place], Referring Provider: CORNERSTONE Weeks in Treatment: 0 Information Obtained From Patient Constitutional Symptoms (General Health) Complaints and Symptoms: Negative for: Fatigue; Fever; Chills; Marked Weight Change Eyes Complaints and Symptoms: Positive for: Glasses / Contacts - glasses Ear/Nose/Mouth/Throat Complaints and Symptoms: Negative for: Chronic sinus problems or rhinitis Respiratory Complaints and Symptoms: Negative for: Chronic or frequent coughs; Shortness of Breath Gastrointestinal Complaints and Symptoms: Negative for: Frequent diarrhea; Nausea; Vomiting Medical History: Past Medical History Notes: Ulcerative colitis Endocrine Complaints and Symptoms: Negative for: Heat/cold intolerance Genitourinary Complaints and Symptoms: Negative for: Frequent urination Integumentary (Skin) Complaints and Symptoms: Negative for: Wounds Musculoskeletal Complaints and Symptoms: Negative for: Muscle Pain; Muscle Weakness Hematologic/Lymphatic Medical History: Positive for: Anemia -  normocytic Cardiovascular Medical History: Positive for: Congestive Heart Failure; Hypertension Immunological Neurologic Medical History: Positive for: Quadriplegia - Spastic Past Medical History Notes: Multiple Sclerosis Oncologic Psychiatric Medical History: Past Medical History Notes: Major Depressive Disorder Immunizations Pneumococcal Vaccine: Received Pneumococcal Vaccination: Yes Implantable Devices None Family and Social History Unknown History: Yes; Never smoker; Marital Status - Married; Alcohol Use: Rarely; Drug Use: No History; Caffeine Use: Moderate - Soda; Financial Concerns: No; Food, Clothing or Shelter Needs: No; Support System Lacking: No; Transportation Concerns: No Engineer, maintenance) Signed: 12/02/2019 5:47:38 PM By: Linton Ham MD Signed: 12/02/2019 6:13:01 PM By: Levan Hurst RN, BSN Entered By: Levan Hurst on 12/02/2019 15:41:28 -------------------------------------------------------------------------------- [place] Details Patient Name: Date of Service: [facility] 12/02/2019 Medical Record [id number] Patient Account Number: [id number] Date of Birth/Sex: Treating RN: 06-Oct-1965 (54 y.o. Vanessa Short Primary Care Provider: Sharmaine Base, CORNERSTONE Other Clinician: Referring Provider: Treating Provider/Extender:Flem Enderle, Francetta Found, CORNERSTONE Weeks in Treatment: 0 Diagnosis Coding ICD-10 Codes Code Description L89.150 Pressure ulcer of sacral region, unstageable B07.9 Viral wart, unspecified G35 Multiple sclerosis Facility Procedures CPT4 Code: [id number] Description: 06301 - WOUND CARE VISIT-LEV 3 EST PT Modifier: Quantity: 1 Physician Procedures Electronic Signature(s) Signed: 12/02/2019 5:47:38 PM By: Linton Ham MD Signed: 12/03/2019 10:46:53 AM By: Carlene Coria RN Entered By: Carlene Coria on 12/02/2019 17:41:26

## 2019-12-04 LAB — STRATIFY JCV AB (W/ INDEX) W/ RFLX
Index Value: 1.03 — ABNORMAL HIGH
Stratify JCV (TM) Ab w/Reflex Inhibition: POSITIVE — AB

## 2019-12-08 ENCOUNTER — Telehealth: Payer: Self-pay | Admitting: *Deleted

## 2019-12-08 NOTE — Telephone Encounter (Signed)
Called pt and relayed results per Dr. Felecia Shelling note. She verbalized understanding. She will keep appt for 12/22/19 at 1:30pm with Dr. Felecia Shelling.  She received Wynetta Emery and [employer] covid-19 vaccine on 11/13/19.

## 2019-12-08 NOTE — Telephone Encounter (Signed)
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  She has an appointment to see me in 2 weeks and I will discuss this further at that time.

## 2019-12-22 ENCOUNTER — Ambulatory Visit (INDEPENDENT_AMBULATORY_CARE_PROVIDER_SITE_OTHER): Payer: Managed Care, Other (non HMO) | Admitting: Neurology

## 2019-12-22 ENCOUNTER — Encounter: Payer: Self-pay | Admitting: Neurology

## 2019-12-22 ENCOUNTER — Telehealth: Payer: Self-pay | Admitting: *Deleted

## 2019-12-22 ENCOUNTER — Other Ambulatory Visit: Payer: Self-pay

## 2019-12-22 VITALS — BP 119/70 | HR 66 | Temp 97.3°F | Ht 63.0 in

## 2019-12-22 DIAGNOSIS — Z79899 Other long term (current) drug therapy: Secondary | ICD-10-CM | POA: Diagnosis not present

## 2019-12-22 DIAGNOSIS — Z993 Dependence on wheelchair: Secondary | ICD-10-CM | POA: Diagnosis not present

## 2019-12-22 DIAGNOSIS — G35 Multiple sclerosis: Secondary | ICD-10-CM | POA: Diagnosis not present

## 2019-12-22 DIAGNOSIS — G8250 Quadriplegia, unspecified: Secondary | ICD-10-CM

## 2019-12-22 DIAGNOSIS — Z95828 Presence of other vascular implants and grafts: Secondary | ICD-10-CM

## 2019-12-22 DIAGNOSIS — R768 Other specified abnormal immunological findings in serum: Secondary | ICD-10-CM

## 2019-12-22 NOTE — Progress Notes (Addendum)
[facility]  [name] DOB: [birth year]  REFERRING CLINICIAN: Adah Salvage, [facility] HISTORY FROM: patient  REASON FOR VISIT: MS   HISTORICAL  CHIEF COMPLAINT:  Chief Complaint  Patient presents with  . Follow-up    RM 13. Last seen 06/24/2019.  . Multiple Sclerosis    On Tysabri. Last infusion 11/25/19 and next infusion 01/06/20. Last JCV positive at 1.03-drawn on 11/25/19. Here to discuss other DMT options.     HISTORY OF PRESENT ILLNESS:  Vanessa Short is a 54 y.o. woman diagnosed with relapsing remitting MS in 1989.   She currently has a relapsing/active form of second progressive MS  Update 12/22/19: She is on Tysabri and is tolerating it well.   She denies any new MS MS or neurologic symptoms.  She has just become JCV Ab mid positive at 1.03 -- was low positive 0.49.  A year ago, she was borderline positive and before that was negative.  While on Tysabri, she did not have any relapses though she has had some slow progression of weakness, more noticeable in the arms.   She had previously been on Rebif, Copaxone and Aubagio but had breakthrough disease including relapses and MRI activity on these medications  She sleeps well most nights.  She has noted mild progressive cognitive changes over the last few years and occasionally has had visual hallucinations.  She had her JNJ vaccination and tolerated it well.     She is wheelchair bound and needs a new seat cushion   Update 06/24/2019: She is on Tysabri and notes no exacerbation but her left side continues to slowly worsen.   Her JCV Ab is 0.26.  She is wheelchair-bound.  Spasticity is doing a little bit better but she still will have occasional spasms in the back and legs with movement at times.  We discussed referral to physical therapy.  She felt she received a benefit but stopped due to COVID-19.  Due to the worsening weakness, we did a nerve conduction and EMG study couple  months ago.  It was more consistent with a central cause of her weakness rather than a neuropathy.  She is sleeping well most nights but does still have some daytime somnolence.  She still has occasional visual hallucination though it is not troublesome.    Update 12/16/2018 (virtual) She is on Tysabri every 6 weeks due to being JCV Ab positive.  Her last infusion was about a week ago.    She did note an improvement in her hands after starting Tysabri.  The latest JCV antibody is still positive though with an improved titer of 0.30 (was 0.74).  She is wheelchair bound.   She gets phasic spasms in her back, oftenrelated to moving.    She has arm weakness, left > right and her fingers sometimes curl up.     She has an electric like sensation in the left upper arm.   Moving intensifies the dysesthesia.   The fingers have tingling but not actual numbness.   The thumb is involved more than the rest of the hand.    This seemed to come on fairly suddenly a couple weeks ago and she has also noted left arm weakness.   She denies neck pain but has some discomfort -- unchanged since before the new symptoms.     At the last visit, she was very lethargic but has been much better.   She still has some lethargy and sleepiness.   She  notes waking up with a snort some times.    She dozes off when in the recliner but not in her wheelchair.     She occasioanlly has visual hallucinations but they are not bothering her much.    Her omeprazole was stopped due to low iron    Update 07/08/2018: Her last JCV Ab was positive at 0.74 so we changed the Tysabri from every 4 weeks to 6 weeks.    Her last infusion was 8 weeks ago as she had an infection and ws on antibiotics a few weeks ago.    She had a respiratory infection last month and was prescribed antibiotics and a a steroid.   She went to the ED and received a new prescription, doxycycline (she just completed).     She had no fever but she perceived that the temperature ws  up and down.   She has more hallucinations since the recent infection.   She took one risperidone but it made her very lethargic.    No coughing, urine is not cloudy.     CXR 11/10 showed some left lower patchiness but no clear pneumonia.      CT scan had shown gallstones.  She is not c/o pain.      Currently, she is very lethargic, more so than typical.   She is weaker in hands...legs always weak.     She is on baclofen 20 mg po qid and dantrolene tid.     Update 03/06/2018: She continues on Tysabri and she tolerates it well.  She feels the MS has been stable.  She has had no exacerbations.  When last checked, she had a low positive JCV antibody implying a 1: 2000 risk of PML after 2 years.  She has severe weakness in her legs associated with spasticity and is in a wheelchair.  She takes baclofen (60-70 mg daily) and dantrolene 50 mg tid to help with some of the phasic spasm episodes.    She has weakness in her hands and feels strength has improved in her hands on Tysabri.   She also can feed herself better.   She has mild dysphagia but voice is normal.   She has had dysesthesias and they are helped by Trileptal.   Her left vision is reduced and she will sometimes have diplopia looking left.  She feels her bladder function is doing well on Vesicare.  She has constipation helped by Amitiza.      Fatigue has done better on Tysabri.  She usually falls asleep easily but will have sleep maintenance insomnia.  She denies any more issues with hallucinations.  Update 10/31/2017: She feels her MS is stable. .  She has had 2 Tysabri infusions with the first 1 in February and the last one yesterday.  JCV antibody is indeterminate at 0.35 but the inhibition assay was positive.  We have discussed that there is a small risk of PML that could increase the longer she is on the medication.  She does feel that her fatigue has done better since starting Tysabri and she feels that her hands are doing better as well.    She  has severe weakness in the legs with spasticity. Spasticity is worse when anxious.    She is taking baclofen 5-6 pills (10 mg each) daily. She took Marinol 2.5 mg but had excessive lethargy and quit.   That has helped her some.  Dysesthesias are much better and she is only on Trileptal now (stopped  lamotrigine and amitriptyline).    Bladder function is doing well on Vesicare.  Amitiza helps constipation.   She denies any major problems with her vision but will sometimes have diplopia when she looks to the left.  She has had issues with fatigue though it is better on the Tysabri.  Most nights, she will fall asleep well but will often wake up.  This is worse since her GYN surgery and going through menopause.  She has had some depression that is fairly stable.  In the past she was having visual hallucinations sometimes seeing her husband when he is not around.      Update 07/03/2017:    Her MS is doing about the same.   She does not note any definite new symptoms or exacerbation but she does feel that her arm strength, much worse with her pneumonia and has not returned to baseline. Spasticity is still a problem in the legs.     For spasticity,she is on baclofen 10 mg po 5-6 pills a day.      She was taking Marinol 2.5 mg bid but felt a little out of it and backed off to just once a day which is more beneficial for her.   Bladder is doing ok on Vesicare.   When she looks to the far left she notes diplopia and she shuts her left eye to avoid.    She notes some fatigue but this is about the same as at the last visit. She is sleeping well most nights but will wake up more if her husbands snoring is loud.    She has depression. He feels this is stable patient does well. She is not having as many hallucinations.  From 02/27/2017: MS:   She is on Aubagio,  she tolerates it well. She has noted some worsening while on the medication clinically. In the past, we have discussed ocrelizumab.  Gait/Strength/sensation:  Legs  are doing the same with severe weakness and spasticity.  Dantrolene has not helped much for spasticity.   She takes baclofen 4.5 pills a day and we discussed increasing this and decreasing the dantrolene.   Spasms often occur when she lays down in bed.   She has dysesthetic nerve pain in her legs, helped by amitriptyline and lamotrigine.     In the past, we tried Botox injections into the left arm but  Botox did not improve functioning.   Writing has become more difficulty. She also has had more problems controlling her wheelchair and has had control on her current wheelchair.    Sensation/pain:  She has right anterior shoulder region dysesthesia but this is milder than last year.  Gabapentin was not tolerated.    Lamotrigine 150 mg po bid with amitriptyline helped with dysesthesia at first but less now.   We just went up on the lamotrigine.    Vision:   Vision is about the same.  She has mild decreased acuity and occasional diplopia.        Bladder/Bowel:  She has bladder dysfunction with some benefit form Vesicare.   She more incontinence and always uses Depends.  She is on Linzess due to many of her med's making constipation worse.  She also has ulcerative colitis and requires frequent colonoscopies  Fatigue/sleep:   She has daily fatigue .   She has some insomnia at times.     She has nocturia at night and sleep is variable.    Hallucinations:  Prior to the hospitalization, she  would get occasional mild visual hallucinations. These are occurring more. When she was at her worse, the last week of December in the first couple days in January, she had more hallucinations and also had severe confusion.    Mood/Cognition     she notes a mild depression. She has some increased stress with some family issues such as being the executor of her father's will and  having a teenage daughter. There is mild anxiety. Earlier this year, in association with a severe infection requiring hospitalization, she had  cognitive issues. Back to baseline and currently she is doing well with mild reduced attention..  MS History:  She presented with a Lhermite's syndrome in 1988 or 1989.   MRIs of the head and neck were performed. They showed lesions consistent with the diagnosis of multiple sclerosis. Lumbar puncture was not necessary. Over the next 15 years, she would get some exacerbations and have courses of steroids. However, a disease modifying therapy was not prescribed. By 2004, she had difficulty with her gait and would often have to lean on somebody for support.  She started Copaxone that year and stayed on it for about one half years. She stopped due to a lot of itching. She then switched to Rebif. While on Copaxone Rebif she continues to have exacerbations and further difficulties with her gait. About 2012 she switched to [facility] and has been on that medicine since. She tolerates Aubagio well but is not sure how well it is working for her.   She was off Aubagio x 2-3 weeks in 2013 and felt she did worse.  Last MRI's about 3 years ago.    REVIEW OF SYSTEMS:  Constitutional: No fevers, chills, sweats, or change in appetite.  Has fatigue and poor slep Eyes: see above.   No eye pain Ear, nose and throat: No hearing loss, ear pain, nasal congestion, sore throat Cardiovascular: No chest pain, palpitations Respiratory:  No shortness of breath at rest or with exertion.   No wheezes GastrointestinaI: Has UC - doing well.   No nausea, vomiting, diarrhea, abdominal pain, fecal incontinence Genitourinary:  see above Musculoskeletal:  No neck pain, back pain Integumentary: No rash, pruritus, skin lesions Neurological: as above Psychiatric: as above. Endocrine: No palpitations, diaphoresis, change in appetite, change in weigh or increased thirst Hematologic/Lymphatic:  No anemia, purpura, petechiae. Allergic/Immunologic: No itchy/runny eyes, nasal congestion, recent allergic reactions, rashes  ALLERGIES: Allergies   Allergen Reactions  . Oxycodone-Acetaminophen Other (See Comments)    Other  . Risperidone And Related     Became very lethargic  . Penicillins Rash    Has patient had a PCN reaction causing immediate rash, facial/tongue/throat swelling, SOB or lightheadedness with hypotension: NO Has patient had a PCN reaction causing severe rash involving mucus membranes or skin necrosis: NO Has patient had a PCN reaction that required hospitalization NO Has patient had a PCN reaction occurring within the last 10 years:NO If all of the above answers are "NO", then may proceed with Cephalosporin use.    HOME MEDICATIONS: Outpatient Medications Prior to Visit  Medication Sig Dispense Refill  . acyclovir (ZOVIRAX) 400 MG tablet Take 400 mg by mouth daily. Take every day per patient  0  . AMITIZA 8 MCG capsule Take 8 mcg by mouth 3 (three) times daily. GI MD prescribing- will take 2 caps BID prn  1  . Armodafinil 200 MG TABS 1/2 to one po qAM 30 tablet 5  . aspirin EC 81 MG tablet Take  81 mg by mouth daily.    Marland Kitchen b complex vitamins tablet Take 1 tablet by mouth daily.     . baclofen (LIORESAL) 20 MG tablet TAKE 1 TABLET BY MOUTH UP TO 4 TIMES DAILY 360 tablet 4  . cholecalciferol (VITAMIN D) 1000 UNITS tablet Take 1,000 Units by mouth daily.     . dantrolene (DANTRIUM) 50 MG capsule TAKE 1 CAPSULE (50 MG TOTAL) BY MOUTH 3 (THREE) TIMES DAILY. 270 capsule 3  . famotidine (PEPCID) 40 MG tablet Take 1 tablet (40 mg total) by mouth 2 (two) times daily. 60 tablet 11  . FLUoxetine (PROZAC) 10 MG capsule Take 5 capsules (50 mg total) by mouth daily. 734 capsule 0  . folic acid (FOLVITE) 287 MCG tablet Take 400 mcg by mouth daily.     . furosemide (LASIX) 40 MG tablet Take 1 tablet (40 mg total) by mouth 2 (two) times daily. 30 tablet   . ibuprofen (ADVIL,MOTRIN) 200 MG tablet Take 200 mg by mouth 3 (three) times daily.     Marland Kitchen labetalol (NORMODYNE) 100 MG tablet Take 100 mg by mouth daily.    Marland Kitchen  lidocaine-prilocaine (EMLA) cream Apply one inch before port-a cath access prn 30 g 3  . mesalamine (PENTASA) 500 MG CR capsule Take 1,000 mg by mouth 3 (three) times daily.     . mometasone (ELOCON) 0.1 % ointment Apply 1 application topically 2 (two) times daily.    . Multiple Vitamin (MULTIVITAMIN) tablet Take 1 tablet by mouth daily.    . natalizumab (TYSABRI) 300 MG/15ML injection Inject 300 mg into the vein every 6 (six) weeks.     . OXcarbazepine (TRILEPTAL) 150 MG tablet TAKE 1 TABLET BY MOUTH 3 TIMES A DAY 270 tablet 0  . QUEtiapine (SEROQUEL) 25 MG tablet Take 1 tablet (25 mg total) by mouth at bedtime. 30 tablet 0  . sodium chloride (OCEAN) 0.65 % SOLN nasal spray Place 1 spray into both nostrils as needed for congestion.  0  . tolterodine (DETROL) 2 MG tablet Take 2 mg by mouth 2 (two) times daily.   1   No facility-administered medications prior to visit.    PAST MEDICAL HISTORY: Past Medical History:  Diagnosis Date  . Headache   . History of kidney stones   . Hypertension   . Kidney stones   . Movement disorder   . Multiple sclerosis ([place])   . Neuropathy   . Ulcerative colitis ([place])   . Vision abnormalities     PAST SURGICAL HISTORY: Past Surgical History:  Procedure Laterality Date  . ENDOMETRIAL ABLATION  10/10/2017  . KIDNEY STONE SURGERY    . PORTACATH PLACEMENT N/A 10/16/2017   Procedure: ULTRA SOUND GUIDED INSERTION PORT-A-CATH ERAS PATHWAY;  Surgeon: Kieth Brightly Arta Bruce, MD;  Location: [facility];  Service: General;  Laterality: N/A;    FAMILY HISTORY: Family History  Problem Relation Age of Onset  . Dementia Mother   . Hypertension Father   . Hyperlipidemia Father   . Diabetes Father   . Heart disease Father   . Breast cancer Neg Hx     SOCIAL HISTORY:  Social History   Socioeconomic History  . Marital status: Married    Spouse name: Not on file  . Number of children: Not on file  . Years of education: Not on file  . Highest education level:  Not on file  Occupational History  . Not on file  Tobacco Use  . Smoking status: Never Smoker  .  Smokeless tobacco: Never Used  Substance and Sexual Activity  . Alcohol use: Yes    Comment: occasional glass of wine  . Drug use: No  . Sexual activity: Not on file  Other Topics Concern  . Not on file  Social History Narrative  . Not on file   Social Determinants of Health   Financial Resource Strain:   . Difficulty of Paying Living Expenses:   Food Insecurity:   . Worried About Charity fundraiser in the Last Year:   . Arboriculturist in the Last Year:   Transportation Needs:   . Film/video editor (Medical):   Marland Kitchen Lack of Transportation (Non-Medical):   Physical Activity:   . Days of Exercise per Week:   . Minutes of Exercise per Session:   Stress:   . Feeling of Stress :   Social Connections:   . Frequency of Communication with Friends and Family:   . Frequency of Social Gatherings with Friends and Family:   . Attends Religious Services:   . Active Member of Clubs or Organizations:   . Attends Archivist Meetings:   Marland Kitchen Marital Status:   Intimate Partner Violence:   . Fear of Current or Ex-Partner:   . Emotionally Abused:   Marland Kitchen Physically Abused:   . Sexually Abused:      PHYSICAL EXAM  Vitals:   12/22/19 1328  BP: 119/70  Pulse: 66  Temp: (!) 97.3 F (36.3 C)  SpO2: 97%  Height: 5' 3"  (1.6 m)    Body mass index is 31 kg/m.   General: The patient is well-developed and well-nourished and in no acute distress  Skin: Extremities are without rash.  Changes in and around 5th digit nail on the right.       Neurologic Exam  Mental status: The patient is lethargic and oriented x 2 at the time of the examination.  Focus and attention was reduced.  Speech is normal.  Cranial nerves: Extraocular movements are full.   Facial strength and sensation is normal.  Trapezius strength is normal.  There is no dysarthria.  No obvious hearing deficits are  noted.  Motor:  Muscle bulk is normal .  Muscle tone is increased in the legs, right greater than left.  She has increased muscle tone in the arms, right greater than left.   She has no definite strength in the legs.. Strength is 2-/5 in the left arm and 2/5 in the right arm.  She cannot write but is able to operate a joystick.   Sensory: She appears to have symmetric vibration sensation in the arms and reduced vibration sensation at the knees.  Gait and station: She can not stand or walk.    Reflexes: She has increased reflexes in her legs.    DIAGNOSTIC DATA (LABS, IMAGING, TESTING) - I reviewed patient records, labs, notes, testing and imaging myself where available.  Lab Results  Component Value Date   WBC 8.7 11/25/2019   HGB 12.1 11/25/2019   HCT 36.1 11/25/2019   MCV 97 11/25/2019   PLT 144 (L) 11/25/2019      Component Value Date/Time   NA 150 (H) 10/07/2018 1459   K 3.5 10/07/2018 1459   CL 108 (H) 10/07/2018 1459   CO2 26 10/07/2018 1459   GLUCOSE 101 (H) 10/07/2018 1459   GLUCOSE 94 07/30/2018 0550   BUN 20 10/07/2018 1459   CREATININE 0.83 10/07/2018 1459   CALCIUM 8.6 (L) 10/07/2018 1459  PROT 6.0 06/24/2019 1546   ALBUMIN 4.5 06/24/2019 1546   AST 14 06/24/2019 1546   ALT 22 06/24/2019 1546   ALKPHOS 85 06/24/2019 1546   BILITOT 0.3 06/24/2019 1546   GFRNONAA 81 10/07/2018 1459   GFRAA 93 10/07/2018 1459       ASSESSMENT AND PLAN  Multiple sclerosis (HCC)  High risk medication use  Port-A-Cath in place  Wheelchair bound  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   Long discussion about her positive JCV antibody status.  She is now middle positive.  This implies a risk of about 1: 300 when Tysabri is infused every 4 weeks.  It is probable that every 6-week Tysabri has a lower risk but the risk is still most likely greater than 1: 1000.  We discussed options including continuing on Tysabri with the added risk, or switching  to a different medication.  She would like to switch to a different medication.  Although Ocrevus has a similar efficacy as Tysabri, her risks might be higher especially respiratory infections.  Therefore, a switch to Vumerity would be safer.   2.   She will get the port flush today and will need to have it flushed about every 6 weeks. 3.   Continue other medications and therapies 4.   She is wheelchair bound and needs a new cushion 5.   rtc 6 months, sooner if ne or worsening neurologic issues Jamyson Jirak A. Felecia Shelling, MD, PhD 1/61/0960, 4:54 PM Certified in Neurology, Clinical Neurophysiology, Sleep Medicine, Pain Medicine and Neuroimaging  [facility] [address] [place],  [address] [phone]

## 2019-12-22 NOTE — Telephone Encounter (Addendum)
Faxed completed/signed Vumerity start form to [place] at [phone]. Received fax confirmation.  Submitted PA Vumerity start pack on CMM. Key: [id number]. Waiting on determination from [place].

## 2019-12-22 NOTE — Telephone Encounter (Signed)
Gave completed/signed port-a-cath flush order to be done q month to intrafusion. Pt getting port flushed today while here in the office.

## 2019-12-24 NOTE — Progress Notes (Signed)
[facility] [place], [place] ([id number]) Visit Report for 12/02/2019 Allergy List Details Patient Name: Date of Service: Vanessa Short, [name] 12/02/2019 2:45 PM Medical Record Number: [id number] Patient Account Number: [id number] Date of Birth/Sex: Treating Short: 16-Jun-1966 (54 y.o. Female) Vanessa Short Primary Care Vanessa Short Moder: Vanessa Short Vanessa Short Other Clinician: Referring Vanessa Short: Treating Vanessa Short/Extender: Vanessa Short, [place] Vanessa Short Weeks in Treatment: 0 Allergies Active Allergies risperidone Reaction: hallucinations Severity: Severe Percocet Reaction: hallucinations Severity: Severe penicillin Reaction: hives Severity: Moderate Allergy Notes Electronic Signature(s) Signed: 12/02/2019 6:13:01 PM By: Vanessa Short Entered By: Vanessa Short on 12/02/2019 15:35:50 -------------------------------------------------------------------------------- Arrival Information Details Patient Name: Date of Service: Vanessa Short, [name] 12/02/2019 2:45 PM Medical Record Number: [id number] Patient Account Number: [id number] Date of Birth/Sex: Treating Short: [birth year] (54 y.o. Female) Vanessa Short Primary Care Vanessa Short: Vanessa Short, [place] Vanessa Short Other Clinician: Referring Vanessa Short: Treating Vanessa Short/Extender: Vanessa Short, [place] Vanessa Short Weeks in Treatment: 0 Visit Information Patient Arrived: Wheel Chair Arrival Time: 15:09 Accompanied By: caregiver Transfer Assistance: None Patient Identification Verified: Yes Secondary Verification Process Completed: Yes Patient Requires Transmission-Based Precautions: No Patient Has Alerts: No Electronic Signature(s) Signed: 12/02/2019 6:13:01 PM By: Vanessa Short Entered By: Vanessa Short on 12/02/2019 15:34:47 -------------------------------------------------------------------------------- Clinic Level of Care Assessment Details Patient Name: Date of Service: Vanessa Short, [name] 12/02/2019 2:45 PM Medical Record  Number: [id number] Patient Account Number: [id number] Date of Birth/Sex: Treating Short: 06-Nov-1965 (54 y.o. Female) Vanessa Short Primary Care Vanessa Short: Vanessa Short, [place] Vanessa Short Other Clinician: Referring Vanessa Short Weeks in Treatment: 0 Clinic Level of Care Assessment Items TOOL 2 Quantity Score X- 1 0 Use when only an EandM is performed on the INITIAL visit ASSESSMENTS - Nursing Assessment / Reassessment X- 1 20 General Physical Exam (combine w/ comprehensive assessment (listed just below) when performed on new pt. evals) X- 1 25 Comprehensive Assessment (HX, ROS, Risk Assessments, Wounds Hx, etc.) ASSESSMENTS - Wound and Skin A ssessment / Reassessment []  - 0 Simple Wound Assessment / Reassessment - one wound []  - 0 Complex Wound Assessment / Reassessment - multiple wounds []  - 0 Dermatologic / Skin Assessment (not related to wound area) ASSESSMENTS - Ostomy and/or Continence Assessment and Care []  - 0 Incontinence Assessment and Management []  - 0 Ostomy Care Assessment and Management (repouching, etc.) PROCESS - Coordination of Care []  - 0 Simple Patient / Family Education for ongoing care []  - 0 Complex (extensive) Patient / Family Education for ongoing care X- 1 10 Staff obtains Programmer, systems, Records, T Results / Process Orders est []  - 0 Staff telephones HHA, Nursing Homes / Clarify orders / etc []  - 0 Routine Transfer to another Facility (non-emergent condition) []  - 0 Routine Hospital Admission (non-emergent condition) X- 1 15 New Admissions / Biomedical engineer / Ordering NPWT Apligraf, etc. , []  - 0 Emergency Hospital Admission (emergent condition) X- 1 10 Simple Discharge Coordination []  - 0 Complex (extensive) Discharge Coordination PROCESS - Special Needs []  - 0 Pediatric / Minor Patient Management []  - 0 Isolation Patient Management []  - 0 Hearing / Language / Visual special  needs []  - 0 Assessment of Community assistance (transportation, D/C planning, etc.) []  - 0 Additional assistance / Altered mentation []  - 0 Support Surface(s) Assessment (bed, cushion, seat, etc.) INTERVENTIONS - Wound Cleansing / Measurement []  - 0 Wound Imaging (photographs - any number of wounds) []  - 0 Wound Tracing (instead of photographs) []  - 0 Simple Wound Measurement - one wound []  -  0 Complex Wound Measurement - multiple wounds []  - 0 Simple Wound Cleansing - one wound []  - 0 Complex Wound Cleansing - multiple wounds INTERVENTIONS - Wound Dressings []  - 0 Small Wound Dressing one or multiple wounds []  - 0 Medium Wound Dressing one or multiple wounds []  - 0 Large Wound Dressing one or multiple wounds []  - 0 Application of Medications - injection INTERVENTIONS - Miscellaneous []  - 0 External ear exam []  - 0 Specimen Collection (cultures, biopsies, blood, body fluids, etc.) []  - 0 Specimen(s) / Culture(s) sent or taken to Lab for analysis []  - 0 Patient Transfer (multiple staff / Civil Service fast streamer / Similar devices) []  - 0 Simple Staple / Suture removal (25 or less) []  - 0 Complex Staple / Suture removal (26 or more) []  - 0 Hypo / Hyperglycemic Management (close monitor of Blood Glucose) []  - 0 Ankle / Brachial Index (ABI) - do not check if billed separately Has the patient been seen at the hospital within the last three years: Yes Total Score: 80 Level Of Care: New/Established - Level 3 Electronic Signature(s) Signed: 12/02/2019 5:39:56 PM By: Vanessa Short Entered By: Vanessa Short on 12/02/2019 16:20:05 -------------------------------------------------------------------------------- Encounter Discharge Information Details Patient Name: Date of Service: [facility] [name] 12/02/2019 2:45 PM Medical Record Number: [id number] Patient Account Number: [id number] Date of Birth/Sex: Treating Short: May 31, 1966 (54 y.o. Female) Vanessa Short Primary Care Vanessa Short:  Vanessa Short Vanessa Short Other Clinician: Referring Zhoey Blackstock: Treating Jurni Cesaro/Extender: Vanessa Short, [place] Vanessa Short Weeks in Treatment: 0 Encounter Discharge Information Items Discharge Condition: Stable Ambulatory Status: Wheelchair Discharge Destination: Home Transportation: Other Accompanied By: caregiver Schedule Follow-up Appointment: Yes Clinical Summary of Care: Patient Declined Electronic Signature(s) Signed: 12/02/2019 5:57:12 PM By: Vanessa Short Entered By: Vanessa Short on 12/02/2019 16:44:32 -------------------------------------------------------------------------------- Multi-Disciplinary Care Plan Details Patient Name: Date of Service: [facility], [place] 12/02/2019 2:45 PM Medical Record Number: [id number] Patient Account Number: [id number] Date of Birth/Sex: Treating Short: 1966-02-06 (54 y.o. Female) Vanessa Short Primary Care Jenica Costilow: Vanessa Short Vanessa Short Other Clinician: Referring Trinette Vera: Treating Jakaiden Fill/Extender: Vanessa Short, [place] Vanessa Short Weeks in Treatment: 0 Active Inactive Electronic Signature(s) Signed: 12/02/2019 5:39:56 PM By: Vanessa Short Entered By: Vanessa Short on 12/02/2019 16:07:43 -------------------------------------------------------------------------------- Pain Assessment Details Patient Name: Date of Service: [place], [place] 12/02/2019 2:45 PM Medical Record Number: [id number] Patient Account Number: [id number] Date of Birth/Sex: Treating Short: 06/20/1966 (54 y.o. Female) Vanessa Short Primary Care Glorianna Gott: Vanessa Short Vanessa Short Other Clinician: Referring Lionell Matuszak: Treating Geovany Trudo/Extender: Vanessa Short, [place] Vanessa Short Weeks in Treatment: 0 Active Problems Location of Pain Severity and Description of Pain Patient Has Paino No Site Locations Pain Management and Medication Current Pain Management: Electronic Signature(s) Signed: 12/02/2019 6:13:01 PM By: Vanessa Short Entered By: Vanessa Short on 12/02/2019 15:43:14 -------------------------------------------------------------------------------- Patient/Caregiver Education Details Patient Name: Date of Service: [facility] NIE 4/20/2021andnbsp2:45 PM Medical Record Number: [id number] Patient Account Number: [id number] Date of Birth/Gender: Treating Short: April 13, 1966 (54 y.o. Female) Vanessa Short Primary Care Physician: Vanessa Short Vanessa Short Other Clinician: Referring Physician: Treating Physician/Extender: Vanessa Short, [place] Vanessa Short Weeks in Treatment: 0 Education Assessment Education Provided To: Patient Education Topics Provided Wound/Skin Impairment: Methods: Explain/Verbal Responses: State content correctly Electronic Signature(s) Signed: 12/02/2019 5:39:56 PM By: Vanessa Short Entered By: Vanessa Short on 12/02/2019 16:07:56 -------------------------------------------------------------------------------- [place] Details Patient Name: Date of Service: [facility], [place] 12/02/2019 2:45 PM Medical Record Number: [id number] Patient Account Number: [id number] Date of Birth/Sex: Treating Short: Jun 22, 1966 (  54 y.o. Female) [place], [place] Primary Care Kathrina Crosley: Vanessa Short, [place] Vanessa Short Other Clinician: Referring Lanecia Sliva: Treating Emilio Baylock/Extender: Vanessa Short, [place] Vanessa Short Weeks in Treatment: 0 Vital Signs Time Taken: 15:10 Temperature (F): 98.0 Height (in): 63 Pulse (bpm): 59 Source: Stated Respiratory Rate (breaths/min): 18 Weight (lbs): 160 Blood Pressure (mmHg): 155/79 Source: Stated Reference Range: 80 - 120 mg / dl Body Mass Index (BMI): 28.3 Electronic Signature(s) Signed: 12/24/2019 9:10:01 AM By: Sandre Kitty Entered By: Sandre Kitty on 12/02/2019 15:10:43

## 2019-12-24 NOTE — Telephone Encounter (Signed)
Called [employer] at [phone] to check to see if they received PA form we sent in yesterday. Spokew with Sharyn Lull in pharmacy services. She confirmed they received it and it is under review right now.

## 2019-12-25 ENCOUNTER — Telehealth: Payer: Self-pay | Admitting: Neurology

## 2019-12-25 NOTE — Addendum Note (Signed)
Addended by: Wyvonnia Lora on: 12/25/2019 07:12 AM   Modules accepted: Orders

## 2019-12-25 NOTE — Telephone Encounter (Signed)
Called pt back. Relayed Dr. Garth Bigness message. She verbalized understanding. She will receive Vumerity in the mail tomorrow. She will call back if she has any further questions/concerns.

## 2019-12-25 NOTE — Telephone Encounter (Signed)
UUHCCE:[id number];OJIZQU:[id number];Review Type:Prior Auth;Coverage Start Date:12/22/2019;Coverage End Date:12/23/2020;

## 2019-12-25 NOTE — Telephone Encounter (Signed)
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

## 2019-12-25 NOTE — Telephone Encounter (Signed)
Pt has called asking Emma,RN to call her re: the percentage with PML and JVC  Level please call

## 2019-12-29 ENCOUNTER — Telehealth: Payer: Self-pay | Admitting: Neurology

## 2019-12-29 NOTE — Telephone Encounter (Signed)
Received fax from [place] that [facility] has confirmed that Vumerity has been shipped. Phone: [phone]. Fax: [phone].

## 2019-12-29 NOTE — Telephone Encounter (Signed)
Called pt back. 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 I cleared this up and advised this was if she stayed on Tysabri. She received Vumerity and will go ahead and start this. She will keep f/u 06/23/20 at 1pm with Dr. Felecia Shelling and knows labs will be checked then. Nothing further needed.

## 2019-12-29 NOTE — Telephone Encounter (Signed)
Phone rep checked office voicemail's, at 12:08 pt left message asking if she can go back on Aubagio instead of this new medication being pursued.  Please call.

## 2020-01-01 ENCOUNTER — Other Ambulatory Visit: Payer: Self-pay | Admitting: Neurology

## 2020-01-05 ENCOUNTER — Telehealth: Payer: Self-pay | Admitting: Neurology

## 2020-01-05 NOTE — Telephone Encounter (Signed)
Call patient to let her know per Dr. Felecia Shelling that she can take 69m ASA with each dose of Vumerity. Advised her to call back if this does not help with the flushing. She verbalized understanding.

## 2020-01-05 NOTE — Telephone Encounter (Signed)
Phone rep checked office voicemail's,@ 9:31 pt stated she would like to know what mg of Aspirin can she take with the new MS medication because she is experiencing extreme flushing.  Please call

## 2020-01-14 ENCOUNTER — Telehealth: Payer: Self-pay | Admitting: Neurology

## 2020-01-14 NOTE — Telephone Encounter (Signed)
Spoke with [place], this is an Pharmacist, hospital

## 2020-01-14 NOTE — Telephone Encounter (Signed)
Pt called to advise OV have been sent stating pt had bleeding from the vaginal area but it is coming from the urine and will be contacting PCP and providing a urine sample for additional testing

## 2020-01-16 ENCOUNTER — Telehealth: Payer: Self-pay | Admitting: Neurology

## 2020-01-16 NOTE — Telephone Encounter (Signed)
Nurse Educator Santiago Glad Harris(on behalf of [facility]) has called re: pt taking Vumerity.  Pt informed Santiago Glad that she had blood in her urine which was very concerning to pt.  Santiago Glad wanted to Dr Felecia Shelling to be aware of this, she can be called at [phone]

## 2020-01-19 ENCOUNTER — Telehealth: Payer: Self-pay | Admitting: Neurology

## 2020-01-19 NOTE — Telephone Encounter (Signed)
Called, LVM for pt returning her call.

## 2020-01-19 NOTE — Telephone Encounter (Signed)
Pt called stating that she does not think her current medications are working for her and she is wanting to know if something else can be tried. Pt understands that the provider is out till Wednesday.

## 2020-01-19 NOTE — Telephone Encounter (Addendum)
Called Santiago Glad back and advised we received her message and will f/u with pt. Pt did call 01/14/20 and advised she was going to f/u with her PCP on this. I called and LVM for pt to call back to let us know if she f/u with PCP on this. Advised. Dr. Felecia Shelling out until this Wednesday.

## 2020-01-20 ENCOUNTER — Other Ambulatory Visit: Payer: Self-pay | Admitting: *Deleted

## 2020-01-20 NOTE — Telephone Encounter (Signed)
Its ok for her tto take Vumerity,   A UTI can make her MS syptoms worse so hopefully she will feel better after it is cleared

## 2020-01-20 NOTE — Telephone Encounter (Signed)
Dr. Felecia Shelling- Please advise  Took call from phone staff and spoke with pt. She confirmed she does have a UTI that PCP is treating. Her medical assistant is going to pick up Macrobid 140m po BID x10days today for her to start. Blood has resolved. Urine still cloudy. Once she finishes antibiotic, PCP will retest urine to make sure infection has cleared. Advised UTI could be exacerbating MS sx and she should make sure this gets cleared up first. She is wanting to know if she should take Vumerity while she has current UTI/on antibiotics. Advised Dr. SFelecia Shellingout until tomorrow. I will forward message to him and call her back once he responds. She verbalized understanding.

## 2020-01-20 NOTE — Telephone Encounter (Signed)
Called pt. She is going to contact PCP about blackout spells since being on macrobid. She reported this to [place], RN in the infusion suite. Relayed Dr. Garth Bigness message. She verbalized understanding.

## 2020-01-31 ENCOUNTER — Other Ambulatory Visit: Payer: Self-pay | Admitting: Neurology

## 2020-03-16 ENCOUNTER — Other Ambulatory Visit: Payer: Self-pay | Admitting: *Deleted

## 2020-03-16 DIAGNOSIS — G35 Multiple sclerosis: Secondary | ICD-10-CM

## 2020-03-16 DIAGNOSIS — Z79899 Other long term (current) drug therapy: Secondary | ICD-10-CM

## 2020-03-16 NOTE — Addendum Note (Signed)
Addended by: Inis Sizer D on: 03/16/2020 03:01 PM   Modules accepted: Orders

## 2020-03-17 ENCOUNTER — Telehealth: Payer: Self-pay | Admitting: *Deleted

## 2020-03-17 LAB — CBC WITH DIFFERENTIAL/PLATELET
Basophils Absolute: 0.1 10*3/uL (ref 0.0–0.2)
Basos: 1 %
EOS (ABSOLUTE): 0.8 10*3/uL — ABNORMAL HIGH (ref 0.0–0.4)
Eos: 9 %
Hematocrit: 42.1 % (ref 34.0–46.6)
Hemoglobin: 13.6 g/dL (ref 11.1–15.9)
Immature Grans (Abs): 0 10*3/uL (ref 0.0–0.1)
Immature Granulocytes: 0 %
Lymphocytes Absolute: 1.6 10*3/uL (ref 0.7–3.1)
Lymphs: 18 %
MCH: 31.5 pg (ref 26.6–33.0)
MCHC: 32.3 g/dL (ref 31.5–35.7)
MCV: 98 fL — ABNORMAL HIGH (ref 79–97)
Monocytes Absolute: 0.8 10*3/uL (ref 0.1–0.9)
Monocytes: 10 %
Neutrophils Absolute: 5.4 10*3/uL (ref 1.4–7.0)
Neutrophils: 62 %
Platelets: 135 10*3/uL — ABNORMAL LOW (ref 150–450)
RBC: 4.32 x10E6/uL (ref 3.77–5.28)
RDW: 12.4 % (ref 11.7–15.4)
WBC: 8.7 10*3/uL (ref 3.4–10.8)

## 2020-03-17 LAB — COMPREHENSIVE METABOLIC PANEL
ALT: 24 IU/L (ref 0–32)
AST: 15 IU/L (ref 0–40)
Albumin/Globulin Ratio: 2.2 (ref 1.2–2.2)
Albumin: 4.7 g/dL (ref 3.8–4.9)
Alkaline Phosphatase: 96 IU/L (ref 48–121)
BUN/Creatinine Ratio: 28 — ABNORMAL HIGH (ref 9–23)
BUN: 26 mg/dL — ABNORMAL HIGH (ref 6–24)
Bilirubin Total: <0.2 mg/dL (ref 0.0–1.2)
CO2: 24 mmol/L (ref 20–29)
Calcium: 10.1 mg/dL (ref 8.7–10.2)
Chloride: 100 mmol/L (ref 96–106)
Creatinine, Ser: 0.94 mg/dL (ref 0.57–1.00)
GFR calc Af Amer: 79 mL/min/{1.73_m2} (ref >59)
GFR calc non Af Amer: 69 mL/min/{1.73_m2} (ref >59)
Globulin, Total: 2.1 g/dL (ref 1.5–4.5)
Glucose: 169 mg/dL — ABNORMAL HIGH (ref 65–99)
Potassium: 3.9 mmol/L (ref 3.5–5.2)
Sodium: 142 mmol/L (ref 134–144)
Total Protein: 6.8 g/dL (ref 6.0–8.5)

## 2020-03-17 LAB — TSH: TSH: 5.64 u[IU]/mL — ABNORMAL HIGH (ref 0.450–4.500)

## 2020-03-17 NOTE — Telephone Encounter (Signed)
Called and LVM for pt about results per Dr. Felecia Shelling note. Advised her to f/u with PCP about elevated TSH level. I forwarded results to PCP via epic. Advised her to call back if she has further questions/concerns.

## 2020-03-17 NOTE — Telephone Encounter (Signed)
-----   Message from Britt Bottom, MD sent at 03/17/2020  3:37 PM EDT ----- Most labs are fine.   Was not fasting so glucose was fine.   TSH is mildly elevated.   We can forward labs to PCP

## 2020-04-15 ENCOUNTER — Other Ambulatory Visit: Payer: Self-pay | Admitting: *Deleted

## 2020-04-15 DIAGNOSIS — Z95828 Presence of other vascular implants and grafts: Secondary | ICD-10-CM

## 2020-04-15 MED ORDER — LIDOCAINE-PRILOCAINE 2.5-2.5 % EX CREA: TOPICAL_CREAM | CUTANEOUS | 3 refills | Status: DC

## 2020-04-29 ENCOUNTER — Telehealth: Payer: Self-pay | Admitting: Neurology

## 2020-04-29 NOTE — Telephone Encounter (Signed)
Noted  

## 2020-04-29 NOTE — Telephone Encounter (Signed)
Pt has called asking that Dr Felecia Shelling and Laurence Ferrari be aware that [employer] will soon send notification of the modifications needed for her.  No call back requested, this is [place]

## 2020-05-24 ENCOUNTER — Other Ambulatory Visit: Payer: Self-pay | Admitting: Neurology

## 2020-05-26 NOTE — Telephone Encounter (Signed)
Faxed addended notes from 12/22/19 that include: she is wheelchair bound and needs new seat cushion to [employer] at [phone]. Received fax confirmation.

## 2020-06-23 ENCOUNTER — Telehealth: Payer: Self-pay | Admitting: *Deleted

## 2020-06-23 ENCOUNTER — Ambulatory Visit: Payer: Medicare Other | Admitting: Neurology

## 2020-06-23 NOTE — Telephone Encounter (Signed)
Late entry from 06/22/2020: faxed completed/signed order back to [employer] for repair to power wheelchair. Fax: [phone].  I called today to ask if letter of medical necessity was needed per request on paper. They advised this was not needed d/t only being a repair. They confirmed they received signed order back, nothing further needed.

## 2020-07-02 ENCOUNTER — Encounter: Payer: Self-pay | Admitting: Neurology

## 2020-07-02 ENCOUNTER — Other Ambulatory Visit: Payer: Self-pay | Admitting: Family Medicine

## 2020-07-02 ENCOUNTER — Ambulatory Visit (INDEPENDENT_AMBULATORY_CARE_PROVIDER_SITE_OTHER): Payer: Managed Care, Other (non HMO) | Admitting: Neurology

## 2020-07-02 VITALS — BP 106/66 | HR 64

## 2020-07-02 DIAGNOSIS — Z7401 Bed confinement status: Secondary | ICD-10-CM

## 2020-07-02 DIAGNOSIS — G35 Multiple sclerosis: Secondary | ICD-10-CM | POA: Diagnosis not present

## 2020-07-02 DIAGNOSIS — Z1231 Encounter for screening mammogram for malignant neoplasm of breast: Secondary | ICD-10-CM

## 2020-07-02 DIAGNOSIS — R29898 Other symptoms and signs involving the musculoskeletal system: Secondary | ICD-10-CM

## 2020-07-02 DIAGNOSIS — G8250 Quadriplegia, unspecified: Secondary | ICD-10-CM | POA: Diagnosis not present

## 2020-07-02 DIAGNOSIS — Z79899 Other long term (current) drug therapy: Secondary | ICD-10-CM

## 2020-07-02 DIAGNOSIS — R32 Unspecified urinary incontinence: Secondary | ICD-10-CM

## 2020-07-02 NOTE — Progress Notes (Addendum)
[facility]  [name] DOB: 1966/06/05  REFERRING CLINICIAN: Adah Salvage, [facility] HISTORY FROM: patient  REASON FOR VISIT: MS   HISTORICAL  CHIEF COMPLAINT:  Chief Complaint  Patient presents with  . Multiple Sclerosis    rm 13, 6 month FU caregiver- Natalie    HISTORY OF PRESENT ILLNESS:  Vanessa Short is a 54 y.o. woman diagnosed with relapsing remitting MS in 1989.   She currently has a relapsing/active form of second progressive MS  Update 07/02/2020:   She switched to Vumerity from [place].    She has not had any exacerbation but has felt weaker in her left hand/arm.   She notes no difference in her legs.   She assists with transfers.     She has had an area on the sacral area that is raw but not broken down.   She will be getting a new cushion for her wheelchair.   She has noted more edema in her legs.  She uses compression stocking and we discussed intermittent pneumatic compression.  Spasticity has been a problem and she is on Dantrium 50 mg 3 times a day and baclofen 20 mg several times a day.  She is essentially bedbound spending some time in her chair but the majority of the day in bed.  She has urinary incontinence.  There has been mild cognitive dysfunction, most notable when she has had infections but only mild at baseline.  Vision is symmetric.  She notes some fatigue.  She denies depression at this time  MS History:  She presented with a Lhermite's syndrome in 1988 or 1989.   MRIs of the head and neck were performed. They showed lesions consistent with the diagnosis of multiple sclerosis. Lumbar puncture was not necessary. Over the next 15 years, she would get some exacerbations and have courses of steroids. However, a disease modifying therapy was not prescribed. By 2004, she had difficulty with her gait and would often have to lean on somebody for support.  She started Copaxone that year and stayed on it for about  one half years. She stopped due to a lot of itching. She then switched to Rebif. While on Copaxone Rebif she continues to have exacerbations and further difficulties with her gait. About 2012 she switched to Aubagio. She tolerated Aubagio well but progressed.  Tysabri was started in February 2019 after relapse.  Unfortunately she converted from JCV negative to positive.  She felt she did well on Tysabri with some slowing of her progression as well as improved fatigue.  Due to the elevated JCV, she switched to Vumerity in 2021.  MRI 10/11/2016 showed:  Confluent white matter disease throughout the cerebral hemispheres consistent with chronic multiple sclerosis. Several foci show restricted diffusion, but no contrast enhancement.  No new lesions compared to January 2018.  MRI cervical spine 08/16/2016 showed:  C5 myelomalacia, additional spinal cord plaques consistent with chronic demyelination, relatively similar though limited assessment due to patient motion.  MRI brain 08/16/2016 showed:  Numerous foci of reduced diffusion most consistent with hyperacute/active demyelination, less likely infection/septic emboli. Severe chronic demyelination including supra and infratentorial white matter, cortex and deep gray nuclei lesions.  Moderate parenchymal brain volume loss for age.  REVIEW OF SYSTEMS:  Constitutional: No fevers, chills, sweats, or change in appetite.  Has fatigue and poor slep Eyes: see above.   No eye pain Ear, nose and throat: No hearing loss, ear pain, nasal congestion, sore throat Cardiovascular: No chest  pain, palpitations Respiratory:  No shortness of breath at rest or with exertion.   No wheezes GastrointestinaI: Has UC - doing well.   No nausea, vomiting, diarrhea, abdominal pain, fecal incontinence Genitourinary:  see above Musculoskeletal:  No neck pain, back pain Integumentary: No rash, pruritus, skin lesions Neurological: as above Psychiatric: as above. Endocrine: No  palpitations, diaphoresis, change in appetite, change in weigh or increased thirst Hematologic/Lymphatic:  No anemia, purpura, petechiae. Allergic/Immunologic: No itchy/runny eyes, nasal congestion, recent allergic reactions, rashes  ALLERGIES: Allergies  Allergen Reactions  . Nitrofurantoin Other (See Comments)    syncope  . Oxycodone-Acetaminophen Other (See Comments)    Other  . Risperidone And Related     Became very lethargic  . Penicillins Rash    Has patient had a PCN reaction causing immediate rash, facial/tongue/throat swelling, SOB or lightheadedness with hypotension: NO Has patient had a PCN reaction causing severe rash involving mucus membranes or skin necrosis: NO Has patient had a PCN reaction that required hospitalization NO Has patient had a PCN reaction occurring within the last 10 years:NO If all of the above answers are "NO", then may proceed with Cephalosporin use.    HOME MEDICATIONS: Outpatient Medications Prior to Visit  Medication Sig Dispense Refill  . acyclovir (ZOVIRAX) 400 MG tablet Take 400 mg by mouth daily. Take every day per patient  0  . AMITIZA 8 MCG capsule Take 8 mcg by mouth 3 (three) times daily. GI MD prescribing- will take 2 caps BID prn  1  . Armodafinil 200 MG TABS 1/2 to one po qAM 30 tablet 5  . aspirin EC 81 MG tablet Take 81 mg by mouth daily.    Marland Kitchen b complex vitamins tablet Take 1 tablet by mouth daily.     . baclofen (LIORESAL) 20 MG tablet TAKE 1 TABLET BY MOUTH UP TO 4 TIMES DAILY 360 tablet 4  . betamethasone dipropionate (DIPROLENE) 0.05 % ointment Apply topically.    . cholecalciferol (VITAMIN D) 1000 UNITS tablet Take 1,000 Units by mouth daily.     . dantrolene (DANTRIUM) 50 MG capsule TAKE 1 CAPSULE (50 MG TOTAL) BY MOUTH 3 (THREE) TIMES DAILY. 270 capsule 3  . Diroximel Fumarate (VUMERITY) 231 MG CPDR Take by mouth.    . famotidine (PEPCID) 40 MG tablet Take 1 tablet (40 mg total) by mouth 2 (two) times daily. 60 tablet 11  .  FLUoxetine (PROZAC) 10 MG capsule Take 5 capsules (50 mg total) by mouth daily. 623 capsule 0  . folic acid (FOLVITE) 762 MCG tablet Take 400 mcg by mouth daily.     . furosemide (LASIX) 40 MG tablet Take 1 tablet (40 mg total) by mouth 2 (two) times daily. 30 tablet   . ibuprofen (ADVIL,MOTRIN) 200 MG tablet Take 200 mg by mouth 3 (three) times daily.     Marland Kitchen labetalol (NORMODYNE) 100 MG tablet Take 100 mg by mouth daily.    Marland Kitchen lidocaine-prilocaine (EMLA) cream Apply one inch before port-a cath access prn 30 g 3  . mesalamine (PENTASA) 500 MG CR capsule Take 1,000 mg by mouth 3 (three) times daily.     . mometasone (ELOCON) 0.1 % ointment Apply 1 application topically 2 (two) times daily.    . Multiple Vitamin (MULTIVITAMIN) tablet Take 1 tablet by mouth daily.    . OXcarbazepine (TRILEPTAL) 150 MG tablet TAKE 1 TABLET BY MOUTH THREE TIMES A DAY 270 tablet 1  . POTASSIUM CHLORIDE ER PO Take by mouth. Dose  unknown    . QUEtiapine (SEROQUEL) 25 MG tablet Take 1 tablet (25 mg total) by mouth at bedtime. 30 tablet 0  . sodium chloride (OCEAN) 0.65 % SOLN nasal spray Place 1 spray into both nostrils as needed for congestion.  0  . tolterodine (DETROL) 2 MG tablet Take 2 mg by mouth 2 (two) times daily.   1   No facility-administered medications prior to visit.    PAST MEDICAL HISTORY: Past Medical History:  Diagnosis Date  . Headache   . History of kidney stones   . Hypertension   . Kidney stones   . Movement disorder   . Multiple sclerosis ([place])   . Neuropathy   . Ulcerative colitis ([place])   . Vision abnormalities     PAST SURGICAL HISTORY: Past Surgical History:  Procedure Laterality Date  . ENDOMETRIAL ABLATION  10/10/2017  . KIDNEY STONE SURGERY    . PORTACATH PLACEMENT N/A 10/16/2017   Procedure: ULTRA SOUND GUIDED INSERTION PORT-A-CATH ERAS PATHWAY;  Surgeon: Kieth Brightly Arta Bruce, MD;  Location: [facility];  Service: General;  Laterality: N/A;    FAMILY HISTORY: Family History   Problem Relation Age of Onset  . Dementia Mother   . Hypertension Father   . Hyperlipidemia Father   . Diabetes Father   . Heart disease Father   . Breast cancer Neg Hx     SOCIAL HISTORY:  Social History   Socioeconomic History  . Marital status: Married    Spouse name: Not on file  . Number of children: Not on file  . Years of education: Not on file  . Highest education level: Not on file  Occupational History  . Not on file  Tobacco Use  . Smoking status: Never Smoker  . Smokeless tobacco: Never Used  Vaping Use  . Vaping Use: Never used  Substance and Sexual Activity  . Alcohol use: Yes    Comment: occasional glass of wine  . Drug use: No  . Sexual activity: Not on file  Other Topics Concern  . Not on file  Social History Narrative  . Not on file   Social Determinants of Health   Financial Resource Strain:   . Difficulty of Paying Living Expenses: Not on file  Food Insecurity:   . Worried About Charity fundraiser in the Last Year: Not on file  . Ran Out of Food in the Last Year: Not on file  Transportation Needs:   . Lack of Transportation (Medical): Not on file  . Lack of Transportation (Non-Medical): Not on file  Physical Activity:   . Days of Exercise per Week: Not on file  . Minutes of Exercise per Session: Not on file  Stress:   . Feeling of Stress : Not on file  Social Connections:   . Frequency of Communication with Friends and Family: Not on file  . Frequency of Social Gatherings with Friends and Family: Not on file  . Attends Religious Services: Not on file  . Active Member of Clubs or Organizations: Not on file  . Attends Archivist Meetings: Not on file  . Marital Status: Not on file  Intimate Partner Violence:   . Fear of Current or Ex-Partner: Not on file  . Emotionally Abused: Not on file  . Physically Abused: Not on file  . Sexually Abused: Not on file     PHYSICAL EXAM  Vitals:   07/02/20 1051  BP: 106/66  Pulse:  64    There  is no height or weight on file to calculate BMI.   General: The patient is well-developed and well-nourished and in no acute distress  Skin/extremities: Extremities are without rash.  She has edema at the ankles  Neurologic Exam  Mental status: The patient is lethargic and oriented x 2 at the time of the examination.  Focus and attention was reduced.  Speech is normal.  Cranial nerves: Extraocular movements are full.   Facial strength and sensation is normal.  Trapezius strength is normal.  There is no dysarthria.  No obvious hearing deficits are noted.  Motor:  Muscle bulk is normal .  Muscle tone is increased in the legs, right greater than left.  She has increased muscle tone in the arms, right greater than left.   She has no definite strength in the legs.. Strength is 2-/5 in the left arm and 2/5 in the right arm.  She cannot write but is able to operate a joystick.   Sensory: She appears to have symmetric vibration sensation in the arms and reduced vibration sensation at the knees.  Gait and station: She can not stand or walk.    Reflexes: She has increased reflexes in her legs.    DIAGNOSTIC DATA (LABS, IMAGING, TESTING) - I reviewed patient records, labs, notes, testing and imaging myself where available.  Lab Results  Component Value Date   WBC 8.7 03/16/2020   HGB 13.6 03/16/2020   HCT 42.1 03/16/2020   MCV 98 (H) 03/16/2020   PLT 135 (L) 03/16/2020      Component Value Date/Time   NA 142 03/16/2020 1502   K 3.9 03/16/2020 1502   CL 100 03/16/2020 1502   CO2 24 03/16/2020 1502   GLUCOSE 169 (H) 03/16/2020 1502   GLUCOSE 94 07/30/2018 0550   BUN 26 (H) 03/16/2020 1502   CREATININE 0.94 03/16/2020 1502   CALCIUM 10.1 03/16/2020 1502   PROT 6.8 03/16/2020 1502   ALBUMIN 4.7 03/16/2020 1502   AST 15 03/16/2020 1502   ALT 24 03/16/2020 1502   ALKPHOS 96 03/16/2020 1502   BILITOT <0.2 03/16/2020 1502   GFRNONAA 69 03/16/2020 1502   GFRAA 79  03/16/2020 1502       ASSESSMENT AND PLAN  Multiple sclerosis (HCC)  Spastic quadriparesis (HCC)  Hand weakness  Bedbound  Urinary incontinence, unspecified type   1.  She will continue on Vumerity.  We will check blood work when she get her port flushed next.  I had previously discussed Ocrevus but I am reluctant to switch to this due to her risk of infection.   2.   Port will be flushed about every 6 weeks. 3.   Continue other medications and therapies 4.   She is wheelchair bound and bedbound.  I will write a prescription for intermittent pneumatic compression and the [facility] external female catheter system.  Hopefully this will help to avoid DVTs and infection/skin breakdown  5.   rtc 6 months, sooner if ne or worsening neurologic issues  45-minute office visit with the majority of the time spent face-to-face for history and physical, discussion/counseling and decision-making.  Additional time with record review and documentation.  Breanna Shorkey A. Felecia Shelling, MD, PhD 01/65/5374, 8:27 PM Certified in Neurology, Clinical Neurophysiology, Sleep Medicine, Pain Medicine and Neuroimaging  [facility] [address], [place], [place] [address] [phone]  Addendum 09/08/20:  Fixed error in history - correct statement should be she switched from Tysabri to [place]. -- RAS

## 2020-07-06 NOTE — Addendum Note (Signed)
Addended by: Inis Sizer D on: 07/06/2020 02:52 PM   Modules accepted: Orders

## 2020-07-07 ENCOUNTER — Telehealth: Payer: Self-pay | Admitting: *Deleted

## 2020-07-07 LAB — CBC WITH DIFFERENTIAL/PLATELET
Basophils Absolute: 0.1 10*3/uL (ref 0.0–0.2)
Basos: 1 %
EOS (ABSOLUTE): 0.3 10*3/uL (ref 0.0–0.4)
Eos: 6 %
Hematocrit: 42 % (ref 34.0–46.6)
Hemoglobin: 14.1 g/dL (ref 11.1–15.9)
Immature Grans (Abs): 0 10*3/uL (ref 0.0–0.1)
Immature Granulocytes: 1 %
Lymphocytes Absolute: 0.6 10*3/uL — ABNORMAL LOW (ref 0.7–3.1)
Lymphs: 10 %
MCH: 31.8 pg (ref 26.6–33.0)
MCHC: 33.6 g/dL (ref 31.5–35.7)
MCV: 95 fL (ref 79–97)
Monocytes Absolute: 0.5 10*3/uL (ref 0.1–0.9)
Monocytes: 9 %
Neutrophils Absolute: 4.4 10*3/uL (ref 1.4–7.0)
Neutrophils: 73 %
Platelets: 129 10*3/uL — ABNORMAL LOW (ref 150–450)
RBC: 4.43 x10E6/uL (ref 3.77–5.28)
RDW: 12.5 % (ref 11.7–15.4)
WBC: 5.9 10*3/uL (ref 3.4–10.8)

## 2020-07-07 NOTE — Telephone Encounter (Signed)
-----   Message from Britt Bottom, MD sent at 07/07/2020 10:34 AM EST ----- The lymphocyte count is borderline so I would like to recheck in 2-3 months

## 2020-07-12 ENCOUNTER — Other Ambulatory Visit: Payer: Self-pay | Admitting: *Deleted

## 2020-07-12 DIAGNOSIS — Z79899 Other long term (current) drug therapy: Secondary | ICD-10-CM

## 2020-07-12 DIAGNOSIS — G35 Multiple sclerosis: Secondary | ICD-10-CM

## 2020-07-31 ENCOUNTER — Ambulatory Visit: Payer: Medicare Other | Attending: Internal Medicine

## 2020-07-31 DIAGNOSIS — Z23 Encounter for immunization: Secondary | ICD-10-CM

## 2020-07-31 NOTE — Progress Notes (Signed)
   Covid-19 Vaccination Clinic  Name:  Raysha Tilmon    MRN: [id number] DOB: 10/14/1965  07/31/2020  Ms. Erck was observed post Covid-19 immunization for 15 minutes without incident. She was provided with Vaccine Information Sheet and instruction to access the V-Safe system.   Ms. Hulsebus was instructed to call 911 with any severe reactions post vaccine: Marland Kitchen Difficulty breathing  . Swelling of face and throat  . A fast heartbeat  . A bad rash all over body  . Dizziness and weakness   Immunizations Administered    Name Date Dose VIS Date Route   JANSSEN COVID-19 VACCINE 07/31/2020 11:15 AM 0.5 mL 06/02/2020 Intramuscular   Manufacturer: [employer]   Lot: [id number]   [place]: [phone]

## 2020-08-03 ENCOUNTER — Other Ambulatory Visit (INDEPENDENT_AMBULATORY_CARE_PROVIDER_SITE_OTHER): Payer: Self-pay

## 2020-08-03 DIAGNOSIS — Z0289 Encounter for other administrative examinations: Secondary | ICD-10-CM

## 2020-08-03 NOTE — Addendum Note (Signed)
Addended by: Inis Sizer D on: 08/03/2020 02:27 PM   Modules accepted: Orders

## 2020-08-04 ENCOUNTER — Other Ambulatory Visit: Payer: Self-pay | Admitting: Neurology

## 2020-08-04 DIAGNOSIS — G35 Multiple sclerosis: Secondary | ICD-10-CM

## 2020-08-04 DIAGNOSIS — D72810 Lymphocytopenia: Secondary | ICD-10-CM

## 2020-08-04 LAB — CBC WITH DIFFERENTIAL/PLATELET
Basophils Absolute: 0 10*3/uL (ref 0.0–0.2)
Basos: 1 %
EOS (ABSOLUTE): 0.2 10*3/uL (ref 0.0–0.4)
Eos: 4 %
Hematocrit: 39.9 % (ref 34.0–46.6)
Hemoglobin: 13.2 g/dL (ref 11.1–15.9)
Immature Grans (Abs): 0 10*3/uL (ref 0.0–0.1)
Immature Granulocytes: 0 %
Lymphocytes Absolute: 0.4 10*3/uL — ABNORMAL LOW (ref 0.7–3.1)
Lymphs: 8 %
MCH: 31.7 pg (ref 26.6–33.0)
MCHC: 33.1 g/dL (ref 31.5–35.7)
MCV: 96 fL (ref 79–97)
Monocytes Absolute: 0.7 10*3/uL (ref 0.1–0.9)
Monocytes: 12 %
Neutrophils Absolute: 4.1 10*3/uL (ref 1.4–7.0)
Neutrophils: 75 %
Platelets: 119 10*3/uL — ABNORMAL LOW (ref 150–450)
RBC: 4.16 x10E6/uL (ref 3.77–5.28)
RDW: 12.7 % (ref 11.7–15.4)
WBC: 5.5 10*3/uL (ref 3.4–10.8)

## 2020-08-16 ENCOUNTER — Other Ambulatory Visit: Payer: Self-pay | Admitting: Neurology

## 2020-08-19 ENCOUNTER — Ambulatory Visit: Payer: Medicare Other

## 2020-08-25 ENCOUNTER — Other Ambulatory Visit: Payer: Self-pay | Admitting: Neurology

## 2020-08-25 DIAGNOSIS — Z79899 Other long term (current) drug therapy: Secondary | ICD-10-CM

## 2020-08-25 DIAGNOSIS — G35 Multiple sclerosis: Secondary | ICD-10-CM

## 2020-08-31 ENCOUNTER — Telehealth: Payer: Self-pay | Admitting: *Deleted

## 2020-08-31 NOTE — Telephone Encounter (Signed)
Faxed printed/signed PA Vumerity to [place] at [phone]. Received fax confirmation, waiting on determination.

## 2020-09-06 NOTE — Telephone Encounter (Signed)
Received a PA approval from [facility] for Vumerity 231 mg capsule from August 31, 2020 through September 06, 2021.  Crystal at [facility] has been informed.

## 2020-09-07 ENCOUNTER — Other Ambulatory Visit: Payer: Self-pay

## 2020-09-08 ENCOUNTER — Other Ambulatory Visit: Payer: Self-pay | Admitting: *Deleted

## 2020-09-08 ENCOUNTER — Telehealth: Payer: Self-pay | Admitting: *Deleted

## 2020-09-08 DIAGNOSIS — G35 Multiple sclerosis: Secondary | ICD-10-CM

## 2020-09-08 DIAGNOSIS — Z79899 Other long term (current) drug therapy: Secondary | ICD-10-CM

## 2020-09-08 LAB — CBC WITH DIFFERENTIAL/PLATELET
Basophils Absolute: 0 10*3/uL (ref 0.0–0.2)
Basos: 1 %
EOS (ABSOLUTE): 0.2 10*3/uL (ref 0.0–0.4)
Eos: 4 %
Hematocrit: 40.4 % (ref 34.0–46.6)
Hemoglobin: 13.6 g/dL (ref 11.1–15.9)
Immature Grans (Abs): 0 10*3/uL (ref 0.0–0.1)
Immature Granulocytes: 0 %
Lymphocytes Absolute: 0.4 10*3/uL — ABNORMAL LOW (ref 0.7–3.1)
Lymphs: 7 %
MCH: 31.4 pg (ref 26.6–33.0)
MCHC: 33.7 g/dL (ref 31.5–35.7)
MCV: 93 fL (ref 79–97)
Monocytes Absolute: 0.6 10*3/uL (ref 0.1–0.9)
Monocytes: 10 %
Neutrophils Absolute: 4.7 10*3/uL (ref 1.4–7.0)
Neutrophils: 78 %
Platelets: 126 10*3/uL — ABNORMAL LOW (ref 150–450)
RBC: 4.33 x10E6/uL (ref 3.77–5.28)
RDW: 12.5 % (ref 11.7–15.4)
WBC: 6 10*3/uL (ref 3.4–10.8)

## 2020-09-08 NOTE — Telephone Encounter (Signed)
-----   Message from Britt Bottom, MD sent at 09/08/2020 11:15 AM EST ----- Please let her know that the lymphocyte count is still low.  Therefore, I want her to take no vumerity for one week and then resume the Vumerity at only 1 pill twice a day and we will recheck next month - if still low we may need another medication

## 2020-09-16 ENCOUNTER — Telehealth: Payer: Self-pay | Admitting: Neurology

## 2020-09-16 NOTE — Telephone Encounter (Signed)
Called pt back. She states specialty pharmacy has been calling her daily wanting new rx called in for reduced dose that Dr. Felecia Shelling instructed her to do. Advised that since she went off of med for a week and will resume Vumerity at 1 cap po BID this Sunday for 1 month and then have labs checked, she may go back to full dose if labs ok. No need to send in altered rx right now. Advised she can have them call office for [place] to explain if need be. She has refills. Nothing further needed.

## 2020-09-16 NOTE — Telephone Encounter (Signed)
Pt has asked that Dr Felecia Shelling be made aware that she needs a new prescription for her Diroximel Fumarate (VUMERITY) 231 MG CPDR.  Pt is asking it be noted that she stopped taking the Diroximel Fumarate (VUMERITY) 231 MG CPDR on Sunday and will start back next Saturday, pt is asking that when doing the new prescription it be noted that her prescription has changed, please call pt to discuss further.

## 2020-09-17 ENCOUNTER — Ambulatory Visit
Admission: RE | Admit: 2020-09-17 | Discharge: 2020-09-17 | Disposition: A | Discharge status: Home / Self Care | Payer: Medicare Other | Source: Ambulatory Visit | Attending: Family Medicine | Admitting: Family Medicine

## 2020-09-17 ENCOUNTER — Other Ambulatory Visit: Payer: Self-pay

## 2020-09-17 DIAGNOSIS — Z1231 Encounter for screening mammogram for malignant neoplasm of breast: Secondary | ICD-10-CM

## 2020-10-05 ENCOUNTER — Other Ambulatory Visit: Payer: Self-pay

## 2020-10-19 NOTE — Addendum Note (Signed)
Addended by: Inis Sizer D on: 10/19/2020 03:03 PM   Modules accepted: Orders

## 2020-10-20 ENCOUNTER — Telehealth: Payer: Self-pay | Admitting: *Deleted

## 2020-10-20 LAB — CBC WITH DIFFERENTIAL/PLATELET
Basophils Absolute: 0.1 10*3/uL (ref 0.0–0.2)
Basos: 1 %
EOS (ABSOLUTE): 0.3 10*3/uL (ref 0.0–0.4)
Eos: 5 %
Hematocrit: 40.5 % (ref 34.0–46.6)
Hemoglobin: 13.4 g/dL (ref 11.1–15.9)
Immature Grans (Abs): 0 10*3/uL (ref 0.0–0.1)
Immature Granulocytes: 0 %
Lymphocytes Absolute: 0.4 10*3/uL — ABNORMAL LOW (ref 0.7–3.1)
Lymphs: 7 %
MCH: 31.3 pg (ref 26.6–33.0)
MCHC: 33.1 g/dL (ref 31.5–35.7)
MCV: 95 fL (ref 79–97)
Monocytes Absolute: 0.5 10*3/uL (ref 0.1–0.9)
Monocytes: 10 %
Neutrophils Absolute: 4.1 10*3/uL (ref 1.4–7.0)
Neutrophils: 77 %
Platelets: 136 10*3/uL — ABNORMAL LOW (ref 150–450)
RBC: 4.28 x10E6/uL (ref 3.77–5.28)
RDW: 12.5 % (ref 11.7–15.4)
WBC: 5.3 10*3/uL (ref 3.4–10.8)

## 2020-10-20 NOTE — Telephone Encounter (Signed)
She needs to stop as the longer her lymphocytes remain low the higher the risk of PML  We need to see her for a visit then and switch to a a different medication

## 2020-10-20 NOTE — Telephone Encounter (Signed)
Called pt back. Relayed Dr. Garth Bigness message. Scheduled work in visit for 10/27/20 at 1:30pm. She will make sure to bring insurance cards.

## 2020-10-20 NOTE — Telephone Encounter (Signed)
-----   Message from Britt Bottom, MD sent at 10/20/2020 10:47 AM EST ----- The lymphocyte count continues to be too low.  I want her to change the Vumerity to 1 pill twice a day.  We can recheck the CBC with differential in about 6 weeks.  If it is not back into the normal range by then we will need to change medications.

## 2020-10-27 ENCOUNTER — Other Ambulatory Visit: Payer: Self-pay

## 2020-10-27 ENCOUNTER — Ambulatory Visit (INDEPENDENT_AMBULATORY_CARE_PROVIDER_SITE_OTHER): Payer: Managed Care, Other (non HMO) | Admitting: Neurology

## 2020-10-27 ENCOUNTER — Encounter: Payer: Self-pay | Admitting: Neurology

## 2020-10-27 VITALS — BP 119/76 | HR 64 | Ht 63.0 in

## 2020-10-27 DIAGNOSIS — G35 Multiple sclerosis: Secondary | ICD-10-CM | POA: Diagnosis not present

## 2020-10-27 DIAGNOSIS — Z95828 Presence of other vascular implants and grafts: Secondary | ICD-10-CM

## 2020-10-27 DIAGNOSIS — G8250 Quadriplegia, unspecified: Secondary | ICD-10-CM | POA: Diagnosis not present

## 2020-10-27 DIAGNOSIS — D72810 Lymphocytopenia: Secondary | ICD-10-CM

## 2020-10-27 DIAGNOSIS — Z7401 Bed confinement status: Secondary | ICD-10-CM | POA: Insufficient documentation

## 2020-10-27 DIAGNOSIS — Z79899 Other long term (current) drug therapy: Secondary | ICD-10-CM

## 2020-10-27 NOTE — Progress Notes (Signed)
[facility]  [name] DOB: 09-Jul-1966  REFERRING CLINICIAN: Adah Salvage, [facility] HISTORY FROM: patient  REASON FOR VISIT: MS   HISTORICAL  CHIEF COMPLAINT:  Chief Complaint  Patient presents with  . Follow-up    RM 13. Last seen 07/02/2020. Here to discuss other DMT options d/t having low lymphocyte count on Vumerity. In electric WC in office today.    HISTORY OF PRESENT ILLNESS:  Vanessa Short is a 55 y.o. Short diagnosed with relapsing remitting MS in 1989.   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    She has not had any exacerbation but has felt weaker in her left hand/arm.     Legs have almost no strength.  She uses an Clinical research associate.  She is essentially bedbound spending some time in her chair but the majority of the day in bed.  She has urinary incontinence.   Vision is symmetric.Marland Kitchen  Spasticity has been a problem and she is on Dantrium 50 mg 3 times a day and baclofen 20 mg several times a day.  .  She notes some fatigue.  She denies depression at this time.  Cognition is usually fine but has had issues when she has an infection.   She had the Covid vaccinations and boosters.  MS History:  She presented with a Lhermite's syndrome in 1988 or 1989.   MRIs of the head and neck were performed. They showed lesions consistent with the diagnosis of multiple sclerosis. Lumbar puncture was not necessary. Over the next 15 years, she would get some exacerbations and have courses of steroids. However, a disease modifying therapy was not prescribed. By 2004, she had difficulty with her gait and would often have to lean on somebody for support.  She started Copaxone that year and stayed on it for about one half years. She stopped due to a lot of itching. She then switched to Rebif. While on Copaxone  Rebif she continues to have exacerbations and further difficulties with her gait. About 2012 she switched to Aubagio. She tolerated Aubagio well but progressed.  Tysabri was started in February 2019 after relapse.  Unfortunately she converted from JCV negative to positive.  She felt she did well on Tysabri with some slowing of her progression as well as improved fatigue.  Due to the elevated JCV, she switched to Vumerity in 2021.  MRI 10/11/2016 showed:  Confluent white matter disease throughout the cerebral hemispheres consistent with chronic multiple sclerosis. Several foci show restricted diffusion, but no contrast enhancement.  No new lesions compared to January 2018.  MRI cervical spine 08/16/2016 showed:  C5 myelomalacia, additional spinal cord plaques consistent with chronic demyelination, relatively similar though limited assessment due to patient motion.  MRI brain 08/16/2016 showed:  Numerous foci of reduced diffusion most consistent with hyperacute/active demyelination, less likely infection/septic emboli. Severe chronic demyelination including supra and infratentorial white matter, cortex and deep gray nuclei lesions.  Moderate parenchymal brain volume loss for age.  REVIEW OF SYSTEMS:  Constitutional: No fevers, chills, sweats, or change in appetite.  Has fatigue and poor slep Eyes: see above.   No eye pain Ear, nose and throat: No hearing loss, ear pain, nasal congestion, sore throat Cardiovascular: No chest pain, palpitations Respiratory:  No shortness of breath at rest or with exertion.   No wheezes GastrointestinaI: Has  UC - doing well.   No nausea, vomiting, diarrhea, abdominal pain, fecal incontinence Genitourinary:  see above Musculoskeletal:  No neck pain, back pain Integumentary: No rash, pruritus, skin lesions Neurological: as above Psychiatric: as above. Endocrine: No palpitations, diaphoresis, change in appetite, change in weigh or increased thirst Hematologic/Lymphatic:   No anemia, purpura, petechiae. Allergic/Immunologic: No itchy/runny eyes, nasal congestion, recent allergic reactions, rashes  ALLERGIES: Allergies  Allergen Reactions  . Nitrofurantoin Other (See Comments)    syncope  . Oxycodone-Acetaminophen Other (See Comments)    Other  . Risperidone And Related     Became very lethargic  . Penicillins Rash    Has patient had a PCN reaction causing immediate rash, facial/tongue/throat swelling, SOB or lightheadedness with hypotension: NO Has patient had a PCN reaction causing severe rash involving mucus membranes or skin necrosis: NO Has patient had a PCN reaction that required hospitalization NO Has patient had a PCN reaction occurring within the last 10 years:NO If all of the above answers are "NO", then may proceed with Cephalosporin use.    HOME MEDICATIONS: Outpatient Medications Prior to Visit  Medication Sig Dispense Refill  . acyclovir (ZOVIRAX) 400 MG tablet Take 400 mg by mouth daily. Take every day per patient  0  . AMITIZA 8 MCG capsule Take 8 mcg by mouth 3 (three) times daily. GI MD prescribing- will take 2 caps BID prn  1  . Armodafinil 200 MG TABS 1/2 to one po qAM 30 tablet 5  . aspirin EC 81 MG tablet Take 81 mg by mouth daily.    Marland Kitchen b complex vitamins tablet Take 1 tablet by mouth daily.     . baclofen (LIORESAL) 20 MG tablet TAKE 1 TABLET BY MOUTH UP TO 4 TIMES DAILY 360 tablet 4  . betamethasone dipropionate (DIPROLENE) 0.05 % ointment Apply topically.    . cholecalciferol (VITAMIN D) 1000 UNITS tablet Take 1,000 Units by mouth daily.     . dantrolene (DANTRIUM) 50 MG capsule TAKE 1 CAPSULE (50 MG TOTAL) BY MOUTH 3 (THREE) TIMES DAILY. 270 capsule 3  . famotidine (PEPCID) 40 MG tablet Take 1 tablet (40 mg total) by mouth 2 (two) times daily. 60 tablet 11  . FLUoxetine (PROZAC) 10 MG capsule Take 5 capsules (50 mg total) by mouth daily. 967 capsule 0  . folic acid (FOLVITE) 893 MCG tablet Take 400 mcg by mouth daily.     .  furosemide (LASIX) 40 MG tablet Take 1 tablet (40 mg total) by mouth 2 (two) times daily. 30 tablet   . ibuprofen (ADVIL,MOTRIN) 200 MG tablet Take 200 mg by mouth 3 (three) times daily.    Marland Kitchen labetalol (NORMODYNE) 100 MG tablet Take 100 mg by mouth daily.    Marland Kitchen lidocaine-prilocaine (EMLA) cream Apply one inch before port-a cath access prn 30 g 3  . mesalamine (PENTASA) 500 MG CR capsule Take 1,000 mg by mouth 3 (three) times daily.     . mometasone (ELOCON) 0.1 % ointment Apply 1 application topically 2 (two) times daily.    . Multiple Vitamin (MULTIVITAMIN) tablet Take 1 tablet by mouth daily.    . OXcarbazepine (TRILEPTAL) 150 MG tablet TAKE 1 TABLET BY MOUTH THREE TIMES A DAY 270 tablet 1  . POTASSIUM CHLORIDE ER PO Take by mouth. Dose unknown    . QUEtiapine (SEROQUEL) 25 MG tablet Take 1 tablet (25 mg total) by mouth at bedtime. 30 tablet 0  . sodium chloride (OCEAN) 0.65 % SOLN nasal spray  Place 1 spray into both nostrils as needed for congestion.  0  . tolterodine (DETROL) 2 MG tablet Take 2 mg by mouth 2 (two) times daily.   1   No facility-administered medications prior to visit.    PAST MEDICAL HISTORY: Past Medical History:  Diagnosis Date  . Headache   . History of kidney stones   . Hypertension   . Kidney stones   . Movement disorder   . Multiple sclerosis ([place])   . Neuropathy   . Ulcerative colitis ([place])   . Vision abnormalities     PAST SURGICAL HISTORY: Past Surgical History:  Procedure Laterality Date  . ENDOMETRIAL ABLATION  10/10/2017  . KIDNEY STONE SURGERY    . PORTACATH PLACEMENT N/A 10/16/2017   Procedure: ULTRA SOUND GUIDED INSERTION PORT-A-CATH ERAS PATHWAY;  Surgeon: Kieth Brightly Arta Bruce, MD;  Location: [facility];  Service: General;  Laterality: N/A;    FAMILY HISTORY: Family History  Problem Relation Age of Onset  . Dementia Mother   . Hypertension Father   . Hyperlipidemia Father   . Diabetes Father   . Heart disease Father   . Breast cancer Neg  Hx     SOCIAL HISTORY:  Social History   Socioeconomic History  . Marital status: Married    Spouse name: Not on file  . Number of children: Not on file  . Years of education: Not on file  . Highest education level: Not on file  Occupational History  . Not on file  Tobacco Use  . Smoking status: Never Smoker  . Smokeless tobacco: Never Used  Vaping Use  . Vaping Use: Never used  Substance and Sexual Activity  . Alcohol use: Yes    Comment: occasional glass of wine  . Drug use: No  . Sexual activity: Not on file  Other Topics Concern  . Not on file  Social History Narrative  . Not on file   Social Determinants of Health   Financial Resource Strain: Not on file  Food Insecurity: Not on file  Transportation Needs: Not on file  Physical Activity: Not on file  Stress: Not on file  Social Connections: Not on file  Intimate Partner Violence: Not on file     PHYSICAL EXAM  Vitals:   10/27/20 1307  BP: 119/76  Pulse: 64  Height: 5' 3"  (1.6 m)    Body mass index is 31 kg/m.   General: The patient is well-developed and well-nourished and in no acute distress.  She is in her electric wheelchair.  Skin/extremities: Extremities are without rash.  She has edema at the ankles  Neurologic Exam  Mental status: The patient is lethargic and oriented x 2 at the time of the examination.  Focus and attention was reduced.  Speech is normal.  Cranial nerves: Extraocular movements are full.   Facial strength and sensation is normal.  Trapezius strength is normal.  There is no dysarthria.  No obvious hearing deficits are noted.  Motor:  Muscle bulk is normal .  Muscle tone is increased in the legs, right greater than left.  She has increased muscle tone in the arms, right greater than left.   She has 0/5 strength in the legs.. Strength is 2-/5 in the left arm and 2/5 in the right arm.  She cannot write but is able to operate a joystick.   Sensory: She appears to have symmetric  vibration sensation in the arms and reduced vibration sensation at the knees.  Gait and  station: She can not stand or walk.    Reflexes: She has increased reflexes in her legs.    DIAGNOSTIC DATA (LABS, IMAGING, TESTING) - I reviewed patient records, labs, notes, testing and imaging myself where available.  Lab Results  Component Value Date   WBC 5.3 10/19/2020   HGB 13.4 10/19/2020   HCT 40.5 10/19/2020   MCV 95 10/19/2020   PLT 136 (L) 10/19/2020      Component Value Date/Time   NA 142 03/16/2020 1502   K 3.9 03/16/2020 1502   CL 100 03/16/2020 1502   CO2 24 03/16/2020 1502   GLUCOSE 169 (H) 03/16/2020 1502   GLUCOSE 94 07/30/2018 0550   BUN 26 (H) 03/16/2020 1502   CREATININE 0.94 03/16/2020 1502   CALCIUM 10.1 03/16/2020 1502   PROT 6.8 03/16/2020 1502   ALBUMIN 4.7 03/16/2020 1502   AST 15 03/16/2020 1502   ALT 24 03/16/2020 1502   ALKPHOS 96 03/16/2020 1502   BILITOT <0.2 03/16/2020 1502   GFRNONAA 69 03/16/2020 1502   GFRAA 79 03/16/2020 1502       ASSESSMENT AND PLAN  Multiple sclerosis (HCC) - Plan: MR BRAIN WO CONTRAST, Hepatitis B surface antibody,qualitative, Hepatitis B core antibody, total, Hepatitis B surface antigen, QuantiFERON-TB Gold Plus, CBC with Differential/Platelet, Varicella zoster antibody, IgG, Hepatic function panel  High risk medication use - Plan: MR BRAIN WO CONTRAST, Hepatitis B surface antibody,qualitative, Hepatitis B core antibody, total, Hepatitis B surface antigen, QuantiFERON-TB Gold Plus, CBC with Differential/Platelet, Varicella zoster antibody, IgG, Hepatic function panel  Spastic quadriparesis (HCC)  Port-A-Cath in place  Bedbound  Lymphopenia   1.  Due to low lymphocyte counts we will stop the Vumerity.  We discussed options including Mavenclad and Kesimpta.  We will check some blood work today..   2.   Port will be flushed about every 6 weeks. 3.   Continue other medications and therapies 4.   rtc 6 months, sooner  if ne or worsening neurologic issues  40-minute office visit with the majority of the time spent face-to-face for history and physical, discussion/counseling and decision-making.  Additional time with record review and documentation.  Vanessa Short A. Felecia Shelling, MD, PhD 08/22/3233, 5:73 PM Certified in Neurology, Clinical Neurophysiology, Sleep Medicine, Pain Medicine and Neuroimaging  [facility] [address], [place], [place] [address] [phone]  Addendum 09/08/20:  Fixed error in history - correct statement should be she switched from Tysabri to [place]. -- RAS

## 2020-10-28 ENCOUNTER — Telehealth: Payer: Self-pay | Admitting: Neurology

## 2020-10-28 NOTE — Telephone Encounter (Signed)
Medicare/cigna order sent to GI. They will obtain the auth for cigna and reach out to the patient to schedule.

## 2020-11-01 ENCOUNTER — Telehealth: Payer: Self-pay

## 2020-11-01 DIAGNOSIS — G35 Multiple sclerosis: Secondary | ICD-10-CM

## 2020-11-01 DIAGNOSIS — Z79899 Other long term (current) drug therapy: Secondary | ICD-10-CM

## 2020-11-01 DIAGNOSIS — G35D Multiple sclerosis, unspecified: Secondary | ICD-10-CM

## 2020-11-01 LAB — HEPATITIS B SURFACE ANTIGEN: Hepatitis B Surface Ag: NEGATIVE

## 2020-11-01 LAB — CBC WITH DIFFERENTIAL/PLATELET
Basophils Absolute: 0 10*3/uL (ref 0.0–0.2)
Basos: 1 %
EOS (ABSOLUTE): 0.3 10*3/uL (ref 0.0–0.4)
Eos: 4 %
Hematocrit: 41.7 % (ref 34.0–46.6)
Hemoglobin: 13.6 g/dL (ref 11.1–15.9)
Immature Grans (Abs): 0 10*3/uL (ref 0.0–0.1)
Immature Granulocytes: 0 %
Lymphocytes Absolute: 0.5 10*3/uL — ABNORMAL LOW (ref 0.7–3.1)
Lymphs: 8 %
MCH: 30.9 pg (ref 26.6–33.0)
MCHC: 32.6 g/dL (ref 31.5–35.7)
MCV: 95 fL (ref 79–97)
Monocytes Absolute: 0.6 10*3/uL (ref 0.1–0.9)
Monocytes: 9 %
Neutrophils Absolute: 5.4 10*3/uL (ref 1.4–7.0)
Neutrophils: 78 %
Platelets: 112 10*3/uL — ABNORMAL LOW (ref 150–450)
RBC: 4.4 x10E6/uL (ref 3.77–5.28)
RDW: 12.3 % (ref 11.7–15.4)
WBC: 6.8 10*3/uL (ref 3.4–10.8)

## 2020-11-01 LAB — HEPATIC FUNCTION PANEL
ALT: 25 IU/L (ref 0–32)
AST: 17 IU/L (ref 0–40)
Albumin: 4.5 g/dL (ref 3.8–4.9)
Alkaline Phosphatase: 96 IU/L (ref 44–121)
Bilirubin Total: 0.3 mg/dL (ref 0.0–1.2)
Bilirubin, Direct: <0.1 mg/dL (ref 0.00–0.40)
Total Protein: 6.6 g/dL (ref 6.0–8.5)

## 2020-11-01 LAB — VARICELLA ZOSTER ANTIBODY, IGG: Varicella zoster IgG: 415 index (ref >165)

## 2020-11-01 LAB — QUANTIFERON-TB GOLD PLUS

## 2020-11-01 LAB — HEPATITIS B SURFACE ANTIBODY,QUALITATIVE: Hep B Surface Ab, Qual: NONREACTIVE

## 2020-11-01 LAB — HEPATITIS B CORE ANTIBODY, TOTAL: Hep B Core Total Ab: NEGATIVE

## 2020-11-01 MED ORDER — DIAZEPAM 2 MG PO TABS: ORAL_TABLET | ORAL | 0 refills | Status: DC

## 2020-11-01 NOTE — Telephone Encounter (Addendum)
I spoke with Dr. Felecia Shelling.  Patient may start [place] or Kesimpta.  She will need to be off Vumerity for 1 month, recheck a CBC with differential to make sure lymphocyte count is better before starting new DMT.  She then may start new DMT after those results are reviewed.  Of note, the TB test was canceled by [facility].  I called patient.  She would like to start [place].  She comes every 6 weeks to our office for a port-a-cath flush. She would be agreeable to having blood work done at that appointment.  She is due to come in 3 weeks.  She has been off of Vumerity for at least 1 week.  She did not sign a Mavenclad start form at her appointment.  She is agreeable to me sending the start form to her MyChart account for signature and return.  Her MRI is scheduled for November 12, 2020.  She is asking if Dr. Felecia Shelling will call in [place] for her MRI.  Her pharmacy is [facility] in [place].

## 2020-11-02 NOTE — Telephone Encounter (Signed)
Patient returned my call.  I advised her that to start [place] she will need to have been updated weight.  She reports that she has not been weighed in about 2 and half years since she was in the hospital.  She is unable to stand.  She has no scale or way to measure her weight at home.  I discussed starting Kesimpta instead of [place].  Patient would rather not start Kesimpta because of the injections.  She was on Copaxone and Rebif in the past and has scarring from those injections.  I will discuss this with Dr. Felecia Shelling and call patient back with further updates.

## 2020-11-02 NOTE — Telephone Encounter (Signed)
I called patient to discuss.  She is busy and will call me back later.

## 2020-11-02 NOTE — Telephone Encounter (Addendum)
I spoke with Dr. Felecia Shelling.  He is agreeable to using whichever weight we can find within the past 2 to 3 years.  I found a weight of 175 pounds at a hospital visit in March 2019.  I called to find out if patient thinks this is around what she is now.  No answer left a voicemail asking her to call me back.

## 2020-11-02 NOTE — Telephone Encounter (Signed)
Pt returned call. Please call back when available. 

## 2020-11-02 NOTE — Telephone Encounter (Addendum)
I called patient.  She reports that 175 pounds is accurate.  I advised her that we will use the mavenclad dosing of 175 pounds for her.  Patient verbalized understanding.  I spoke with with Dr. Felecia Shelling and he has completed the [facility] start form. We are waiting on the pt to complete her part. She will need her labs before starting mavenclad as well.

## 2020-11-08 NOTE — Telephone Encounter (Signed)
Mavenclad start form faxed to [place].  Received a receipt of confirmation.

## 2020-11-08 NOTE — Telephone Encounter (Signed)
Completed PA for Mavenclad via [place].  Sent to [employer].  Should have a determination within 3-5 business days. Key: [id number]

## 2020-11-11 NOTE — Telephone Encounter (Signed)
Patient is scheduled at [facility] for 11/29/20 to arrive at 11:30 am. [facility] only [facility] that has a lift that helps with wheelchairs. I sent the patient a mychart message about her appointment info. I also left their number of [phone] incase she needed to r/s.  Medicare/Cigna Josem Kaufmann: [id number] (exp. 11/11/20 to 02/09/21)

## 2020-11-12 ENCOUNTER — Other Ambulatory Visit: Payer: Medicare Other

## 2020-11-15 NOTE — Telephone Encounter (Signed)
PA for [facility] was approved by Cigna:  "XLLIYI:[id number];JUDILO:[id number];Review Type:Prior Auth;Coverage Start Date:10/25/2020;Coverage End Date:11/12/2021;"

## 2020-11-15 NOTE — Telephone Encounter (Addendum)
I called [facility].  I advised Andee Poles that the PA for [facility] was approved.  She will fax me the [facility] clearance form.  We are still waiting on lab work or patient to be cleared for [facility].  She should be coming in for this in the last week or 2.

## 2020-11-16 NOTE — Addendum Note (Signed)
Addended by: Inis Sizer D on: 11/16/2020 02:22 PM   Modules accepted: Orders

## 2020-11-19 LAB — CBC WITH DIFFERENTIAL/PLATELET
Basophils Absolute: 0.1 10*3/uL (ref 0.0–0.2)
Basos: 1 %
EOS (ABSOLUTE): 0.3 10*3/uL (ref 0.0–0.4)
Eos: 5 %
Hematocrit: 38.8 % (ref 34.0–46.6)
Hemoglobin: 13 g/dL (ref 11.1–15.9)
Immature Grans (Abs): 0 10*3/uL (ref 0.0–0.1)
Immature Granulocytes: 0 %
Lymphocytes Absolute: 0.6 10*3/uL — ABNORMAL LOW (ref 0.7–3.1)
Lymphs: 10 %
MCH: 31 pg (ref 26.6–33.0)
MCHC: 33.5 g/dL (ref 31.5–35.7)
MCV: 92 fL (ref 79–97)
Monocytes Absolute: 0.6 10*3/uL (ref 0.1–0.9)
Monocytes: 10 %
Neutrophils Absolute: 4.2 10*3/uL (ref 1.4–7.0)
Neutrophils: 74 %
Platelets: 139 10*3/uL — ABNORMAL LOW (ref 150–450)
RBC: 4.2 x10E6/uL (ref 3.77–5.28)
RDW: 12.4 % (ref 11.7–15.4)
WBC: 5.7 10*3/uL (ref 3.4–10.8)

## 2020-11-19 LAB — QUANTIFERON-TB GOLD PLUS
QuantiFERON Mitogen Value: 1.06 IU/mL
QuantiFERON Nil Value: 0.02 IU/mL
QuantiFERON TB1 Ag Value: 0.02 IU/mL
QuantiFERON TB2 Ag Value: 0.02 IU/mL
QuantiFERON-TB Gold Plus: NEGATIVE

## 2020-11-22 ENCOUNTER — Telehealth: Payer: Self-pay | Admitting: *Deleted

## 2020-11-22 ENCOUNTER — Other Ambulatory Visit: Payer: Self-pay

## 2020-11-22 DIAGNOSIS — Z79899 Other long term (current) drug therapy: Secondary | ICD-10-CM

## 2020-11-22 DIAGNOSIS — G35 Multiple sclerosis: Secondary | ICD-10-CM

## 2020-11-22 NOTE — Telephone Encounter (Signed)
I called patient.  I discussed her lab results.  Since her lymphocyte count is not above 0.8 Dr. Felecia Shelling would like to delay starting Mavenclad, until it is 0.8 or above.  He would like to recheck a CBC differential in the next 2 weeks.  Patient is agreeable to this.  She is coming in in about 3 weeks for an appointment with our infusion suite to have her port flushed.  She would like the blood drawn at that appointment.  I will give the CBC differential order to our infusion suite.  Patient verbalized understanding.

## 2020-11-22 NOTE — Telephone Encounter (Signed)
-----   Message from Britt Bottom, MD sent at 11/22/2020  9:47 AM EDT ----- The lymphocyte count looks better (0.6).  It was 0.5 on the previous 1  however, I would like it to be 0.8 or above before we start [place].  Lets check another CBC in about 2 weeks (either in the lab or closer to her home would be fine).  Hopefully the lymphocytes will be at a high enough level.

## 2020-11-23 NOTE — Telephone Encounter (Signed)
Pt returned call. Please call back when available. 

## 2020-11-23 NOTE — Telephone Encounter (Signed)
Called pt. She was at [facility] at time of call. She will call back to go over lab results.

## 2020-11-23 NOTE — Telephone Encounter (Signed)
Called and spoke with pt. She already spoke about lab results with KD,RN. Scheduled port flush in 3 weeks and will get labs then. I placed her on hold and verified with MD that this was ok. Nothing further needed.

## 2020-11-29 ENCOUNTER — Ambulatory Visit (HOSPITAL_COMMUNITY)
Admission: RE | Admit: 2020-11-29 | Discharge: 2020-11-29 | Disposition: A | Discharge status: Home / Self Care | Payer: Managed Care, Other (non HMO) | Source: Ambulatory Visit | Attending: Neurology | Admitting: Neurology

## 2020-11-29 ENCOUNTER — Other Ambulatory Visit: Payer: Self-pay

## 2020-11-29 DIAGNOSIS — G35 Multiple sclerosis: Secondary | ICD-10-CM | POA: Diagnosis present

## 2020-11-29 DIAGNOSIS — Z79899 Other long term (current) drug therapy: Secondary | ICD-10-CM

## 2020-12-07 NOTE — Telephone Encounter (Addendum)
Patient is scheduled 12/14/20 at 2:30 for her port flush.  The infusion suite also has her CBC differential lab order to be checked at that appointment as well.

## 2020-12-13 ENCOUNTER — Other Ambulatory Visit: Payer: Self-pay | Admitting: *Deleted

## 2020-12-13 DIAGNOSIS — Z79899 Other long term (current) drug therapy: Secondary | ICD-10-CM

## 2020-12-13 DIAGNOSIS — G35 Multiple sclerosis: Secondary | ICD-10-CM

## 2020-12-14 LAB — CBC WITH DIFFERENTIAL/PLATELET
Basophils Absolute: 0.1 10*3/uL (ref 0.0–0.2)
Basos: 1 %
EOS (ABSOLUTE): 0.3 10*3/uL (ref 0.0–0.4)
Eos: 5 %
Hematocrit: 39.2 % (ref 34.0–46.6)
Hemoglobin: 13.3 g/dL (ref 11.1–15.9)
Immature Grans (Abs): 0 10*3/uL (ref 0.0–0.1)
Immature Granulocytes: 0 %
Lymphocytes Absolute: 0.6 10*3/uL — ABNORMAL LOW (ref 0.7–3.1)
Lymphs: 10 %
MCH: 32.2 pg (ref 26.6–33.0)
MCHC: 33.9 g/dL (ref 31.5–35.7)
MCV: 95 fL (ref 79–97)
Monocytes Absolute: 0.4 10*3/uL (ref 0.1–0.9)
Monocytes: 8 %
Neutrophils Absolute: 4.4 10*3/uL (ref 1.4–7.0)
Neutrophils: 76 %
Platelets: 139 10*3/uL — ABNORMAL LOW (ref 150–450)
RBC: 4.13 x10E6/uL (ref 3.77–5.28)
RDW: 12.9 % (ref 11.7–15.4)
WBC: 5.8 10*3/uL (ref 3.4–10.8)

## 2020-12-16 ENCOUNTER — Other Ambulatory Visit: Payer: Self-pay | Admitting: Neurology

## 2020-12-21 ENCOUNTER — Other Ambulatory Visit: Payer: Self-pay

## 2020-12-21 ENCOUNTER — Telehealth: Payer: Self-pay

## 2020-12-21 DIAGNOSIS — Z79899 Other long term (current) drug therapy: Secondary | ICD-10-CM

## 2020-12-21 NOTE — Telephone Encounter (Signed)
-----   Message from Britt Bottom, MD sent at 12/20/2020  5:44 PM EDT ----- Her lymphocyte count is still low (she was 0.6 and I would like to see 0.8 or higher).  Therefore, we need to hold off on starting [place] a little bit longer.  We also need to recheck a CBC with differential.  This can be done when she next comes in for her port flush.

## 2020-12-21 NOTE — Telephone Encounter (Signed)
I called patient.  I discussed her lab work results.  Patient is agreeable to holding off on starting [place] a little bit longer.  She is coming in in about 4 weeks for a port flush.  I will give our infusion suite for CBC with differential lab order to be drawn at that appointment.  Patient verbalized understanding.

## 2020-12-27 NOTE — Telephone Encounter (Signed)
Patient will be coming in June for repeat CBC differential and to have her port flushed.

## 2021-01-06 ENCOUNTER — Ambulatory Visit: Payer: Medicare Other | Admitting: Neurology

## 2021-01-24 ENCOUNTER — Other Ambulatory Visit: Payer: Self-pay

## 2021-01-25 LAB — CBC WITH DIFFERENTIAL/PLATELET
Basophils Absolute: 0.1 10*3/uL (ref 0.0–0.2)
Basos: 1 %
EOS (ABSOLUTE): 0.2 10*3/uL (ref 0.0–0.4)
Eos: 3 %
Hematocrit: 41.5 % (ref 34.0–46.6)
Hemoglobin: 13.6 g/dL (ref 11.1–15.9)
Immature Grans (Abs): 0 10*3/uL (ref 0.0–0.1)
Immature Granulocytes: 0 %
Lymphocytes Absolute: 0.6 10*3/uL — ABNORMAL LOW (ref 0.7–3.1)
Lymphs: 9 %
MCH: 31.8 pg (ref 26.6–33.0)
MCHC: 32.8 g/dL (ref 31.5–35.7)
MCV: 97 fL (ref 79–97)
Monocytes Absolute: 0.5 10*3/uL (ref 0.1–0.9)
Monocytes: 8 %
Neutrophils Absolute: 5 10*3/uL (ref 1.4–7.0)
Neutrophils: 79 %
Platelets: 137 10*3/uL — ABNORMAL LOW (ref 150–450)
RBC: 4.28 x10E6/uL (ref 3.77–5.28)
RDW: 12.8 % (ref 11.7–15.4)
WBC: 6.4 10*3/uL (ref 3.4–10.8)

## 2021-01-26 ENCOUNTER — Telehealth: Payer: Self-pay | Admitting: *Deleted

## 2021-01-26 ENCOUNTER — Other Ambulatory Visit: Payer: Self-pay | Admitting: *Deleted

## 2021-01-26 DIAGNOSIS — Z79899 Other long term (current) drug therapy: Secondary | ICD-10-CM

## 2021-01-26 DIAGNOSIS — G35 Multiple sclerosis: Secondary | ICD-10-CM

## 2021-01-26 DIAGNOSIS — Z95828 Presence of other vascular implants and grafts: Secondary | ICD-10-CM

## 2021-01-26 NOTE — Telephone Encounter (Signed)
-----   Message from Britt Bottom, MD sent at 01/25/2021  5:17 PM EDT ----- The lymphocytes are still a little bit low, same as the last 2 months but better than they were 4 months ago.  Since he is still low, we need to continue to hold off on the [facility].  We can recheck the CBC with differential in about a month

## 2021-01-31 NOTE — Telephone Encounter (Signed)
Lymphocyte count is still low on 01/25/21. Will do CBC again on 7/11 during port flush.

## 2021-02-01 ENCOUNTER — Encounter: Payer: Self-pay | Admitting: Neurology

## 2021-02-21 ENCOUNTER — Other Ambulatory Visit: Payer: Self-pay

## 2021-02-22 LAB — CBC WITH DIFFERENTIAL/PLATELET
Basophils Absolute: 0.1 10*3/uL (ref 0.0–0.2)
Basos: 1 %
EOS (ABSOLUTE): 0.2 10*3/uL (ref 0.0–0.4)
Eos: 3 %
Hematocrit: 41.7 % (ref 34.0–46.6)
Hemoglobin: 14 g/dL (ref 11.1–15.9)
Immature Grans (Abs): 0 10*3/uL (ref 0.0–0.1)
Immature Granulocytes: 0 %
Lymphocytes Absolute: 0.6 10*3/uL — ABNORMAL LOW (ref 0.7–3.1)
Lymphs: 7 %
MCH: 32.5 pg (ref 26.6–33.0)
MCHC: 33.6 g/dL (ref 31.5–35.7)
MCV: 97 fL (ref 79–97)
Monocytes Absolute: 0.6 10*3/uL (ref 0.1–0.9)
Monocytes: 8 %
Neutrophils Absolute: 6.4 10*3/uL (ref 1.4–7.0)
Neutrophils: 81 %
Platelets: 133 10*3/uL — ABNORMAL LOW (ref 150–450)
RBC: 4.31 x10E6/uL (ref 3.77–5.28)
RDW: 12.1 % (ref 11.7–15.4)
WBC: 7.9 10*3/uL (ref 3.4–10.8)

## 2021-02-24 ENCOUNTER — Telehealth: Payer: Self-pay | Admitting: *Deleted

## 2021-02-24 NOTE — Telephone Encounter (Signed)
-----   Message from Britt Bottom, MD sent at 02/24/2021  8:25 AM EDT ----- Her lymphocyte count continues to be mildly low.  We cannot do the [place] until the lymphocyte count is 0.8 or higher.  I would like to recheck a CBC with differential in about 6 weeks but that can be moved up or down a couple weeks if she is getting a port flush

## 2021-02-28 NOTE — Telephone Encounter (Signed)
Lymphocytes still low. Needs repeat CBC in 4-6 weeks.

## 2021-03-15 ENCOUNTER — Telehealth: Payer: Self-pay | Admitting: Neurology

## 2021-03-15 NOTE — Telephone Encounter (Signed)
Pt states she had some blood work done, PCP doctor needed some blood work at the same time we did blood work. However pt states that the doctor is not able to see the results. Would like for [place] to send over her chart to PCP about her blood work. Pt says she has an appt with her later this afternoon. Please advise, requesting a call back.

## 2021-03-15 NOTE — Telephone Encounter (Signed)
Hilda Blades- can you please help w/ this request? Thank you

## 2021-03-17 ENCOUNTER — Other Ambulatory Visit: Payer: Self-pay | Admitting: *Deleted

## 2021-03-17 ENCOUNTER — Telehealth: Payer: Self-pay | Admitting: Neurology

## 2021-03-17 DIAGNOSIS — Z79899 Other long term (current) drug therapy: Secondary | ICD-10-CM

## 2021-03-17 DIAGNOSIS — G35 Multiple sclerosis: Secondary | ICD-10-CM

## 2021-03-17 NOTE — Telephone Encounter (Signed)
Called back, advised pt not cleared for therapy yet. Getting repeat CBC w/ diff aroud 04/04/21. I called pt because she did not have lab appt scheduled. She is actually coming 03/21/21 for port flush and will get labs completed when she comes then. I placed her on lab scheduled and placed future order.

## 2021-03-17 NOTE — Telephone Encounter (Signed)
Anderson Malta with [facility] is asking if pt is ready to begin [facility], please call.

## 2021-03-21 ENCOUNTER — Ambulatory Visit (INDEPENDENT_AMBULATORY_CARE_PROVIDER_SITE_OTHER): Payer: Medicare Other

## 2021-03-21 DIAGNOSIS — Z0289 Encounter for other administrative examinations: Secondary | ICD-10-CM

## 2021-03-21 DIAGNOSIS — Z79899 Other long term (current) drug therapy: Secondary | ICD-10-CM

## 2021-03-21 DIAGNOSIS — G35 Multiple sclerosis: Secondary | ICD-10-CM

## 2021-03-22 LAB — CBC WITH DIFFERENTIAL/PLATELET
Basophils Absolute: 0.1 x10E3/uL (ref 0.0–0.2)
Basos: 1 %
EOS (ABSOLUTE): 0.2 x10E3/uL (ref 0.0–0.4)
Eos: 3 %
Hematocrit: 42.7 % (ref 34.0–46.6)
Hemoglobin: 14.1 g/dL (ref 11.1–15.9)
Immature Grans (Abs): 0 x10E3/uL (ref 0.0–0.1)
Immature Granulocytes: 0 %
Lymphocytes Absolute: 0.6 x10E3/uL — ABNORMAL LOW (ref 0.7–3.1)
Lymphs: 10 %
MCH: 32.1 pg (ref 26.6–33.0)
MCHC: 33 g/dL (ref 31.5–35.7)
MCV: 97 fL (ref 79–97)
Monocytes Absolute: 0.6 x10E3/uL (ref 0.1–0.9)
Monocytes: 9 %
Neutrophils Absolute: 5 x10E3/uL (ref 1.4–7.0)
Neutrophils: 77 %
Platelets: 149 x10E3/uL — ABNORMAL LOW (ref 150–450)
RBC: 4.39 x10E6/uL (ref 3.77–5.28)
RDW: 12.3 % (ref 11.7–15.4)
WBC: 6.5 x10E3/uL (ref 3.4–10.8)

## 2021-03-23 ENCOUNTER — Other Ambulatory Visit: Payer: Self-pay | Admitting: Neurology

## 2021-03-23 DIAGNOSIS — G35 Multiple sclerosis: Secondary | ICD-10-CM

## 2021-03-23 DIAGNOSIS — Z95828 Presence of other vascular implants and grafts: Secondary | ICD-10-CM

## 2021-03-23 MED ORDER — LIDOCAINE-PRILOCAINE 2.5-2.5 % EX CREA: TOPICAL_CREAM | CUTANEOUS | 3 refills | Status: AC

## 2021-03-23 NOTE — Progress Notes (Signed)
cbc

## 2021-03-28 NOTE — Telephone Encounter (Signed)
Lymphocyte count on 03/23/21 is still too low to start mavenclad (0.6). Dr. Felecia Shelling recommends a repeat count in 6-8 weeks.

## 2021-04-10 ENCOUNTER — Other Ambulatory Visit: Payer: Self-pay | Admitting: Neurology

## 2021-04-11 NOTE — Telephone Encounter (Signed)
I called Anderson Malta with [facility].  Mavenclad is still on hold.  Patient's lymphocyte count is not yet high enough.  We need to call [facility] when patient is ready to start [place].

## 2021-04-11 NOTE — Telephone Encounter (Signed)
Jennifer with [facility] called regarding pt if she has began the [facility]. Anderson Malta is requesting a call back at [phone]

## 2021-05-09 ENCOUNTER — Ambulatory Visit (INDEPENDENT_AMBULATORY_CARE_PROVIDER_SITE_OTHER): Payer: Managed Care, Other (non HMO) | Admitting: Neurology

## 2021-05-09 ENCOUNTER — Encounter: Payer: Self-pay | Admitting: Neurology

## 2021-05-09 VITALS — BP 127/77 | HR 66 | Ht 63.0 in | Wt 175.0 lb

## 2021-05-09 DIAGNOSIS — D72810 Lymphocytopenia: Secondary | ICD-10-CM | POA: Diagnosis not present

## 2021-05-09 DIAGNOSIS — G8250 Quadriplegia, unspecified: Secondary | ICD-10-CM | POA: Diagnosis not present

## 2021-05-09 DIAGNOSIS — Z95828 Presence of other vascular implants and grafts: Secondary | ICD-10-CM | POA: Diagnosis not present

## 2021-05-09 DIAGNOSIS — R32 Unspecified urinary incontinence: Secondary | ICD-10-CM

## 2021-05-09 DIAGNOSIS — G35 Multiple sclerosis: Secondary | ICD-10-CM | POA: Diagnosis not present

## 2021-05-09 NOTE — Progress Notes (Signed)
[facility]  [name] DOB: 10-Jun-1966  REFERRING CLINICIAN: Adah Salvage, [facility] HISTORY FROM: patient  REASON FOR VISIT: MS   HISTORICAL  CHIEF COMPLAINT:  Chief Complaint  Patient presents with   Follow-up    Rm 1, w her aide/caretaker . Here for 6 month MS f/u. Pt has been struggling with getting an aide to help w her care. Pt would like to discuss seeking care at the [facility] clinic vs going to different clinics for each condition.     HISTORY OF PRESENT ILLNESS:  [name] is a 55 y.o. woman diagnosed with relapsing remitting MS in 1989.   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    She has not had any exacerbation but has felt weaker in her left hand/arm.     She has blacked out several times on the [place] lift while in a sit to stand lift, especially off the toilet.   She feels she is about to pass out  Potassium supplements did not help (she in on lasix for edema)   She also on labetalol x 9 years.     Legs have almost no strength.  She uses an Clinical research associate.  She is up in her wheelchair or recliner most of the day.   She spends the night in the recliner but that is sometimes uncomfortable.    She has spasticity that is helped by baclofen and dantrolene.  Movements or being dehydrated worsens the spasms.   Some phasic spasms involving the right ankle.     She has urinary incontinence.   Vision is symmetric.Marland Kitchen  Spasticity has been a problem and she is on Dantrium 50 mg 3 times a day and baclofen 20 mg several times a day.  .  She notes some fatigue.  She denies depression at this time.  Cognition is usually fine but has had issues when she has an infection.    She takes fluoxetine 60 mg daily.    She had the Covid vaccinations and boosters.    MS History:  She presented with a  Lhermite's syndrome in 1988 or 1989.   MRIs of the head and neck were performed. They showed lesions consistent with the diagnosis of multiple sclerosis. Lumbar puncture was not necessary. Over the next 15 years, she would get some exacerbations and have courses of steroids. However, a disease modifying therapy was not prescribed. By 2004, she had difficulty with her gait and would often have to lean on somebody for support.  She started Copaxone that year and stayed on it for about one half years. She stopped due to a lot of itching. She then switched to Rebif. While on Copaxone Rebif she continues to have exacerbations and further difficulties with her gait. About 2012 she switched to Aubagio. She tolerated Aubagio well but progressed.  Tysabri was started in February 2019 after relapse.  Unfortunately she converted from JCV negative to positive.  She felt she did well on Tysabri with some slowing of her progression as well as improved fatigue.  Due to the elevated JCV, she switched to Vumerity in 2021.  MRI 10/11/2016 showed:  Confluent white matter disease throughout the cerebral hemispheres consistent with chronic multiple sclerosis. Several foci show restricted diffusion, but no contrast enhancement.  No new lesions compared to January 2018.  MRI  cervical spine 08/16/2016 showed:  C5 myelomalacia, additional spinal cord plaques consistent with chronic demyelination, relatively similar though limited assessment due to patient motion.  MRI brain 08/16/2016 showed:  Numerous foci of reduced diffusion most consistent with hyperacute/active demyelination, less likely infection/septic emboli. Severe chronic demyelination including supra and infratentorial white matter, cortex and deep gray nuclei lesions.  Moderate parenchymal brain volume loss for age.  REVIEW OF SYSTEMS:  Constitutional: No fevers, chills, sweats, or change in appetite.  Has fatigue and poor slep Eyes: see above.   No eye pain Ear, nose  and throat: No hearing loss, ear pain, nasal congestion, sore throat Cardiovascular: No chest pain, palpitations Respiratory:  No shortness of breath at rest or with exertion.   No wheezes GastrointestinaI: Has UC - doing well.   No nausea, vomiting, diarrhea, abdominal pain, fecal incontinence Genitourinary:  see above Musculoskeletal:  No neck pain, back pain Integumentary: No rash, pruritus, skin lesions Neurological: as above Psychiatric: as above. Endocrine: No palpitations, diaphoresis, change in appetite, change in weigh or increased thirst Hematologic/Lymphatic:  No anemia, purpura, petechiae. Allergic/Immunologic: No itchy/runny eyes, nasal congestion, recent allergic reactions, rashes  ALLERGIES: Allergies  Allergen Reactions   Nitrofurantoin Other (See Comments)    syncope   Oxycodone-Acetaminophen Other (See Comments)    Other   Risperidone And Related     Became very lethargic   Penicillins Rash    Has patient had a PCN reaction causing immediate rash, facial/tongue/throat swelling, SOB or lightheadedness with hypotension: NO Has patient had a PCN reaction causing severe rash involving mucus membranes or skin necrosis: NO Has patient had a PCN reaction that required hospitalization NO Has patient had a PCN reaction occurring within the last 10 years:NO If all of the above answers are "NO", then may proceed with Cephalosporin use.    HOME MEDICATIONS: Outpatient Medications Prior to Visit  Medication Sig Dispense Refill   acetaminophen (TYLENOL) 500 MG tablet Take 1 tablet by mouth as needed.     acyclovir (ZOVIRAX) 400 MG tablet Take 400 mg by mouth daily. Take every day per patient  0   AMITIZA 8 MCG capsule Take 8 mcg by mouth 3 (three) times daily. GI MD prescribing- will take 2 caps BID prn  1   aspirin EC 81 MG tablet Take 81 mg by mouth daily.     b complex vitamins tablet Take 1 tablet by mouth daily.      baclofen (LIORESAL) 20 MG tablet TAKE 1 TABLET BY  MOUTH UP TO 4 TIMES DAILY 360 tablet 4   betamethasone dipropionate (DIPROLENE) 0.05 % ointment Apply topically.     cholecalciferol (VITAMIN D) 1000 UNITS tablet Take 1,000 Units by mouth daily.      dantrolene (DANTRIUM) 50 MG capsule TAKE 1 CAPSULE (50 MG TOTAL) BY MOUTH 3 (THREE) TIMES DAILY. 270 capsule 3   famotidine (PEPCID) 20 MG tablet Take 40 mg by mouth 2 (two) times daily.     FLUoxetine (PROZAC) 10 MG capsule Take 5 capsules (50 mg total) by mouth daily. 324 capsule 0   folic acid (FOLVITE) 401 MCG tablet Take 400 mcg by mouth daily.      furosemide (LASIX) 40 MG tablet Take 1 tablet (40 mg total) by mouth 2 (two) times daily. 30 tablet    labetalol (NORMODYNE) 100 MG tablet Take 100 mg by mouth daily.     lidocaine-prilocaine (EMLA) cream Apply one inch before port-a cath access prn 30 g 3   mesalamine (  PENTASA) 500 MG CR capsule Take 1,000 mg by mouth 3 (three) times daily.      mometasone (ELOCON) 0.1 % ointment Apply 1 application topically 2 (two) times daily.     Multiple Vitamin (MULTIVITAMIN) tablet Take 1 tablet by mouth daily.     OXcarbazepine (TRILEPTAL) 150 MG tablet TAKE 1 TABLET BY MOUTH THREE TIMES A DAY 270 tablet 1   POTASSIUM CHLORIDE ER PO Take by mouth. Dose unknown     QUEtiapine (SEROQUEL) 25 MG tablet Take 1 tablet (25 mg total) by mouth at bedtime. 30 tablet 0   sodium chloride (OCEAN) 0.65 % SOLN nasal spray Place 1 spray into both nostrils as needed for congestion.  0   tolterodine (DETROL) 2 MG tablet Take 2 mg by mouth 2 (two) times daily.   1   Armodafinil 200 MG TABS 1/2 to one po qAM 30 tablet 5   diazepam (VALIUM) 2 MG tablet Take 2 or 3 p.o. before MRI. 3 tablet 0   famotidine (PEPCID) 40 MG tablet Take 1 tablet (40 mg total) by mouth 2 (two) times daily. 60 tablet 11   ibuprofen (ADVIL,MOTRIN) 200 MG tablet Take 200 mg by mouth 3 (three) times daily.     No facility-administered medications prior to visit.    PAST MEDICAL HISTORY: Past  Medical History:  Diagnosis Date   Headache    History of kidney stones    Hypertension    Kidney stones    Movement disorder    Multiple sclerosis ([place])    Neuropathy    Ulcerative colitis ([place])    Vision abnormalities     PAST SURGICAL HISTORY: Past Surgical History:  Procedure Laterality Date   ENDOMETRIAL ABLATION  10/10/2017   KIDNEY STONE SURGERY     PORTACATH PLACEMENT N/A 10/16/2017   Procedure: ULTRA SOUND GUIDED INSERTION PORT-A-CATH ERAS PATHWAY;  Surgeon: Mickeal Skinner, MD;  Location: [facility];  Service: General;  Laterality: N/A;    FAMILY HISTORY: Family History  Problem Relation Age of Onset   Dementia Mother    Hypertension Father    Hyperlipidemia Father    Diabetes Father    Heart disease Father    Breast cancer Neg Hx     SOCIAL HISTORY:  Social History   Socioeconomic History   Marital status: Married    Spouse name: Not on file   Number of children: Not on file   Years of education: Not on file   Highest education level: Not on file  Occupational History   Not on file  Tobacco Use   Smoking status: Never   Smokeless tobacco: Never  Vaping Use   Vaping Use: Never used  Substance and Sexual Activity   Alcohol use: Yes    Comment: occasional glass of wine   Drug use: No   Sexual activity: Not on file  Other Topics Concern   Not on file  Social History Narrative   ** Merged History Encounter **       Social Determinants of Health   Financial Resource Strain: Not on file  Food Insecurity: Not on file  Transportation Needs: Not on file  Physical Activity: Not on file  Stress: Not on file  Social Connections: Not on file  Intimate Partner Violence: Not on file     PHYSICAL EXAM  Vitals:   05/09/21 1252  BP: 127/77  Pulse: 66  Weight: 175 lb (79.4 kg)  Height: 5' 3"  (1.6 m)    Body  mass index is 31 kg/m.   General: The patient is well-developed and well-nourished and in no acute distress.  She is in her electric  wheelchair.  Skin/extremities: Extremities are without rash.  She has edema at the ankles  Neurologic Exam  Mental status: The patient is alert and oriented.  Focus and attention was reduced.  Speech is normal.  Cranial nerves: Extraocular movements are full.   Facial strength and sensation is normal.  Trapezius strength is normal.  There is no dysarthria.  No obvious hearing deficits are noted.  Motor:  Muscle bulk is normal .  Muscle tone is mildly increased in the legs, right greater than left.  She has increased muscle tone in the arms, right greater than left.   She has 0/5 strength in the legs.. Strength is 2-/5 in the left arm and 2+ to 3/5 in the right arm.  She cannot write but is able to operate a joystick.   Sensory: She appears to have symmetric vibration sensation in the arms and reduced vibration sensation at the knees.  Gait and station: She can not stand or walk.    Reflexes: She has increased reflexes in her legs.    DIAGNOSTIC DATA (LABS, IMAGING, TESTING) - I reviewed patient records, labs, notes, testing and imaging myself where available.  Lab Results  Component Value Date   WBC 6.5 03/21/2021   HGB 14.1 03/21/2021   HCT 42.7 03/21/2021   MCV 97 03/21/2021   PLT 149 (L) 03/21/2021      Component Value Date/Time   NA 142 03/16/2020 1502   K 3.9 03/16/2020 1502   CL 100 03/16/2020 1502   CO2 24 03/16/2020 1502   GLUCOSE 169 (H) 03/16/2020 1502   GLUCOSE 94 07/30/2018 0550   BUN 26 (H) 03/16/2020 1502   CREATININE 0.94 03/16/2020 1502   CALCIUM 10.1 03/16/2020 1502   PROT 6.6 10/27/2020 1411   ALBUMIN 4.5 10/27/2020 1411   AST 17 10/27/2020 1411   ALT 25 10/27/2020 1411   ALKPHOS 96 10/27/2020 1411   BILITOT 0.3 10/27/2020 1411   GFRNONAA 69 03/16/2020 1502   GFRAA 79 03/16/2020 1502       ASSESSMENT AND PLAN  Multiple sclerosis (HCC)  Spastic quadriparesis (HCC)  Port-A-Cath in place  Lymphopenia  Urinary incontinence, unspecified  type   1.  Due to low lymphocyte counts we stopped  Vumerity.   Her lymphocytes are still low (0.6) so we are holding off starting anything ew until it is 0.8 or above.    We discussed options including [place] and Kesimpta.  We will check some blood work today..   2.   Port will be flushed about every 6 weeks. 3.   Cut labetalol in 1/2 to 50 mg daily.    Stop oxcarbazepine.   If presyncopal spells not better then reduce Lasix.   Consider Mestinon,   Continue dantrolene and baclofen for spasticity.   4.   We discussed the pure wick urinary device  5.   rtc 6 months, sooner if ne or worsening neurologic issues  45-minute office visit with the majority of the time spent face-to-face for history and physical, discussion/counseling and decision-making.  Additional time with record review and documentation.  Joyelle Siedlecki A. Felecia Shelling, MD, PhD 01/28/736, 1:06 PM Certified in Neurology, Clinical Neurophysiology, Sleep Medicine, Pain Medicine and Neuroimaging  [facility] [address], [place], [place] [address] [phone]  Addendum 09/08/20:  Fixed error in history - correct  statement should be she switched from Tysabri to Vumerity. -- RAS

## 2021-05-21 ENCOUNTER — Other Ambulatory Visit: Payer: Self-pay | Admitting: Neurology

## 2021-05-21 DIAGNOSIS — G35 Multiple sclerosis: Secondary | ICD-10-CM

## 2021-05-21 DIAGNOSIS — Z79899 Other long term (current) drug therapy: Secondary | ICD-10-CM

## 2021-05-21 DIAGNOSIS — E785 Hyperlipidemia, unspecified: Secondary | ICD-10-CM

## 2021-05-21 DIAGNOSIS — E559 Vitamin D deficiency, unspecified: Secondary | ICD-10-CM

## 2021-06-06 ENCOUNTER — Other Ambulatory Visit: Payer: Self-pay | Admitting: Neurology

## 2021-06-09 ENCOUNTER — Other Ambulatory Visit (INDEPENDENT_AMBULATORY_CARE_PROVIDER_SITE_OTHER): Payer: Self-pay

## 2021-06-09 ENCOUNTER — Other Ambulatory Visit: Payer: Self-pay | Admitting: Neurology

## 2021-06-09 DIAGNOSIS — Z0289 Encounter for other administrative examinations: Secondary | ICD-10-CM

## 2021-06-09 DIAGNOSIS — Z79899 Other long term (current) drug therapy: Secondary | ICD-10-CM

## 2021-06-09 DIAGNOSIS — G35 Multiple sclerosis: Secondary | ICD-10-CM

## 2021-06-09 DIAGNOSIS — E559 Vitamin D deficiency, unspecified: Secondary | ICD-10-CM

## 2021-06-09 DIAGNOSIS — E785 Hyperlipidemia, unspecified: Secondary | ICD-10-CM

## 2021-06-10 LAB — COMPREHENSIVE METABOLIC PANEL
ALT: 26 IU/L (ref 0–32)
AST: 23 IU/L (ref 0–40)
Albumin/Globulin Ratio: 2.3 — ABNORMAL HIGH (ref 1.2–2.2)
Albumin: 4.5 g/dL (ref 3.8–4.9)
Alkaline Phosphatase: 104 IU/L (ref 44–121)
BUN/Creatinine Ratio: 23 (ref 9–23)
BUN: 24 mg/dL (ref 6–24)
Bilirubin Total: 0.4 mg/dL (ref 0.0–1.2)
CO2: 20 mmol/L (ref 20–29)
Calcium: 9.7 mg/dL (ref 8.7–10.2)
Chloride: 103 mmol/L (ref 96–106)
Creatinine, Ser: 1.03 mg/dL — ABNORMAL HIGH (ref 0.57–1.00)
Globulin, Total: 2 g/dL (ref 1.5–4.5)
Glucose: 115 mg/dL — ABNORMAL HIGH (ref 70–99)
Potassium: 4.5 mmol/L (ref 3.5–5.2)
Sodium: 142 mmol/L (ref 134–144)
Total Protein: 6.5 g/dL (ref 6.0–8.5)
eGFR: 64 mL/min/{1.73_m2} (ref >59)

## 2021-06-10 LAB — CBC WITH DIFFERENTIAL/PLATELET
Basophils Absolute: 0.1 10*3/uL (ref 0.0–0.2)
Basos: 1 %
EOS (ABSOLUTE): 0.2 10*3/uL (ref 0.0–0.4)
Eos: 3 %
Hematocrit: 44.9 % (ref 34.0–46.6)
Hemoglobin: 14.8 g/dL (ref 11.1–15.9)
Immature Grans (Abs): 0 10*3/uL (ref 0.0–0.1)
Immature Granulocytes: 1 %
Lymphocytes Absolute: 0.8 10*3/uL (ref 0.7–3.1)
Lymphs: 11 %
MCH: 32.7 pg (ref 26.6–33.0)
MCHC: 33 g/dL (ref 31.5–35.7)
MCV: 99 fL — ABNORMAL HIGH (ref 79–97)
Monocytes Absolute: 0.8 10*3/uL (ref 0.1–0.9)
Monocytes: 10 %
Neutrophils Absolute: 5.7 10*3/uL (ref 1.4–7.0)
Neutrophils: 74 %
Platelets: 131 10*3/uL — ABNORMAL LOW (ref 150–450)
RBC: 4.53 x10E6/uL (ref 3.77–5.28)
RDW: 12.1 % (ref 11.7–15.4)
WBC: 7.5 10*3/uL (ref 3.4–10.8)

## 2021-06-10 LAB — LIPID PANEL
Chol/HDL Ratio: 2.8 ratio (ref 0.0–4.4)
Cholesterol, Total: 141 mg/dL (ref 100–199)
HDL: 51 mg/dL (ref >39)
LDL Chol Calc (NIH): 65 mg/dL (ref 0–99)
Triglycerides: 143 mg/dL (ref 0–149)
VLDL Cholesterol Cal: 25 mg/dL (ref 5–40)

## 2021-06-10 LAB — TSH: TSH: 4.52 u[IU]/mL — ABNORMAL HIGH (ref 0.450–4.500)

## 2021-06-10 LAB — HEMOGLOBIN A1C
Est. average glucose Bld gHb Est-mCnc: 105 mg/dL
Hgb A1c MFr Bld: 5.3 % (ref 4.8–5.6)

## 2021-06-10 LAB — VITAMIN D 25 HYDROXY (VIT D DEFICIENCY, FRACTURES): Vit D, 25-Hydroxy: 36.8 ng/mL (ref 30.0–100.0)

## 2021-06-29 ENCOUNTER — Other Ambulatory Visit: Payer: Self-pay

## 2021-06-29 DIAGNOSIS — G35 Multiple sclerosis: Secondary | ICD-10-CM

## 2021-06-29 DIAGNOSIS — Z79899 Other long term (current) drug therapy: Secondary | ICD-10-CM

## 2021-06-29 NOTE — Telephone Encounter (Signed)
Dr. Felecia Shelling would like to repeat CBC at patient's port flush appointment on 07/14/21. This order has been placed and given to the [facility]fusion [facility].

## 2021-07-14 ENCOUNTER — Other Ambulatory Visit (INDEPENDENT_AMBULATORY_CARE_PROVIDER_SITE_OTHER): Payer: Self-pay

## 2021-07-14 DIAGNOSIS — Z79899 Other long term (current) drug therapy: Secondary | ICD-10-CM

## 2021-07-14 DIAGNOSIS — G35 Multiple sclerosis: Secondary | ICD-10-CM

## 2021-07-14 DIAGNOSIS — Z0289 Encounter for other administrative examinations: Secondary | ICD-10-CM

## 2021-07-15 LAB — CBC WITH DIFFERENTIAL/PLATELET
Basophils Absolute: 0 10*3/uL (ref 0.0–0.2)
Basos: 1 %
EOS (ABSOLUTE): 0.2 10*3/uL (ref 0.0–0.4)
Eos: 4 %
Hematocrit: 42.7 % (ref 34.0–46.6)
Hemoglobin: 14 g/dL (ref 11.1–15.9)
Immature Grans (Abs): 0 10*3/uL (ref 0.0–0.1)
Immature Granulocytes: 0 %
Lymphocytes Absolute: 0.6 10*3/uL — ABNORMAL LOW (ref 0.7–3.1)
Lymphs: 11 %
MCH: 32 pg (ref 26.6–33.0)
MCHC: 32.8 g/dL (ref 31.5–35.7)
MCV: 98 fL — ABNORMAL HIGH (ref 79–97)
Monocytes Absolute: 0.4 10*3/uL (ref 0.1–0.9)
Monocytes: 7 %
Neutrophils Absolute: 4.5 10*3/uL (ref 1.4–7.0)
Neutrophils: 77 %
Platelets: 126 10*3/uL — ABNORMAL LOW (ref 150–450)
RBC: 4.38 x10E6/uL (ref 3.77–5.28)
RDW: 12.1 % (ref 11.7–15.4)
WBC: 5.8 10*3/uL (ref 3.4–10.8)

## 2021-07-18 ENCOUNTER — Other Ambulatory Visit: Payer: Self-pay

## 2021-07-18 DIAGNOSIS — Z79899 Other long term (current) drug therapy: Secondary | ICD-10-CM

## 2021-07-18 NOTE — Progress Notes (Signed)
CBC diff order given to [facility]fusion [facility].

## 2021-08-11 ENCOUNTER — Other Ambulatory Visit (INDEPENDENT_AMBULATORY_CARE_PROVIDER_SITE_OTHER): Payer: Self-pay

## 2021-08-11 DIAGNOSIS — G35 Multiple sclerosis: Secondary | ICD-10-CM

## 2021-08-11 DIAGNOSIS — Z0289 Encounter for other administrative examinations: Secondary | ICD-10-CM

## 2021-08-11 DIAGNOSIS — Z79899 Other long term (current) drug therapy: Secondary | ICD-10-CM

## 2021-08-12 LAB — CBC WITH DIFFERENTIAL/PLATELET
Basophils Absolute: 0 10*3/uL (ref 0.0–0.2)
Basos: 1 %
EOS (ABSOLUTE): 0.2 10*3/uL (ref 0.0–0.4)
Eos: 3 %
Hematocrit: 42.4 % (ref 34.0–46.6)
Hemoglobin: 14.2 g/dL (ref 11.1–15.9)
Immature Grans (Abs): 0 10*3/uL (ref 0.0–0.1)
Immature Granulocytes: 1 %
Lymphocytes Absolute: 0.7 10*3/uL (ref 0.7–3.1)
Lymphs: 11 %
MCH: 32.5 pg (ref 26.6–33.0)
MCHC: 33.5 g/dL (ref 31.5–35.7)
MCV: 97 fL (ref 79–97)
Monocytes Absolute: 0.5 10*3/uL (ref 0.1–0.9)
Monocytes: 7 %
Neutrophils Absolute: 4.8 10*3/uL (ref 1.4–7.0)
Neutrophils: 77 %
Platelets: 112 10*3/uL — ABNORMAL LOW (ref 150–450)
RBC: 4.37 x10E6/uL (ref 3.77–5.28)
RDW: 12.7 % (ref 11.7–15.4)
WBC: 6.2 10*3/uL (ref 3.4–10.8)

## 2021-08-19 ENCOUNTER — Other Ambulatory Visit: Payer: Self-pay | Admitting: Family Medicine

## 2021-08-19 DIAGNOSIS — Z1231 Encounter for screening mammogram for malignant neoplasm of breast: Secondary | ICD-10-CM

## 2021-09-19 ENCOUNTER — Other Ambulatory Visit: Payer: Self-pay

## 2021-09-19 ENCOUNTER — Ambulatory Visit
Admission: RE | Admit: 2021-09-19 | Discharge: 2021-09-19 | Disposition: A | Discharge status: Home / Self Care | Payer: Managed Care, Other (non HMO) | Source: Ambulatory Visit | Attending: Family Medicine | Admitting: Family Medicine

## 2021-09-19 DIAGNOSIS — Z1231 Encounter for screening mammogram for malignant neoplasm of breast: Secondary | ICD-10-CM

## 2021-10-06 ENCOUNTER — Other Ambulatory Visit: Payer: Self-pay | Admitting: Neurology

## 2021-11-07 ENCOUNTER — Encounter: Payer: Self-pay | Admitting: Neurology

## 2021-11-07 ENCOUNTER — Other Ambulatory Visit: Payer: Self-pay

## 2021-11-07 ENCOUNTER — Ambulatory Visit (INDEPENDENT_AMBULATORY_CARE_PROVIDER_SITE_OTHER): Payer: Managed Care, Other (non HMO) | Admitting: Neurology

## 2021-11-07 VITALS — BP 135/84 | HR 62 | Ht 63.0 in

## 2021-11-07 DIAGNOSIS — G35 Multiple sclerosis: Secondary | ICD-10-CM | POA: Diagnosis not present

## 2021-11-07 DIAGNOSIS — Z79899 Other long term (current) drug therapy: Secondary | ICD-10-CM | POA: Diagnosis not present

## 2021-11-07 DIAGNOSIS — G8250 Quadriplegia, unspecified: Secondary | ICD-10-CM | POA: Diagnosis not present

## 2021-11-07 DIAGNOSIS — R29898 Other symptoms and signs involving the musculoskeletal system: Secondary | ICD-10-CM

## 2021-11-07 DIAGNOSIS — D72810 Lymphocytopenia: Secondary | ICD-10-CM

## 2021-11-07 DIAGNOSIS — Z95828 Presence of other vascular implants and grafts: Secondary | ICD-10-CM | POA: Diagnosis not present

## 2021-11-07 DIAGNOSIS — R32 Unspecified urinary incontinence: Secondary | ICD-10-CM

## 2021-11-07 DIAGNOSIS — R55 Syncope and collapse: Secondary | ICD-10-CM

## 2021-11-07 MED ORDER — LABETALOL HCL 100 MG PO TABS: 50.0000 mg | ORAL_TABLET | Freq: Every day | ORAL | 3 refills | Status: DC

## 2021-11-07 MED ORDER — DANTROLENE SODIUM 50 MG PO CAPS: 50.0000 mg | ORAL_CAPSULE | Freq: Four times a day (QID) | ORAL | 3 refills | Status: DC

## 2021-11-07 MED ORDER — BACLOFEN 20 MG PO TABS: ORAL_TABLET | ORAL | 3 refills | Status: DC

## 2021-11-07 NOTE — Progress Notes (Signed)
? ?[facility] ? ?[name] ?DOB: 1965/10/12 ? ?REFERRING CLINICIAN: Adah Salvage, [facility] ?HISTORY FROM: patient  ? ? ?REASON FOR VISIT: MS ? ? ?HISTORICAL ? ?CHIEF COMPLAINT:  ?Chief Complaint  ?Patient presents with  ? Follow-up  ?  Rm 16, w cargiver. Here for 6 month MS f/u. Not on any DMT due to lymphocyte count. MS stable. Pt tried to back off oxycarbazapine and had to go back to due to increase in tingling in L arm. Pt states spine is becoming more "crooked" .   ? ? ?HISTORY OF PRESENT ILLNESS:  ?Vanessa Short is a 56 y.o. woman diagnosed with relapsing remitting MS in 1989.   She currently has a relapsing/active form of second progressive MS ? ?Update 11/07/2021:   ?Last year, she needed to stop Vumerity due to lymphopenia (several readings were 0.4).    Last lymphocytes in December 2022 was 0.7.  She has not had any exacerbation but has felt weaker in her left hand/arm.    ? ?She uses a sit to stand lift, and is only able to minimally assist when her knees are locked.     She has occasioanl syncope during transfers.   She also nods off to sleep now and then.    ? ?She uses an electric wheelchair as legs are 0-1/5 strength.  She is up in her wheelchair or recliner most of the day.   She spends the night in the recliner but that is sometimes uncomfortable.    She has spasticity that is helped by baclofen and dantrolene.  Movements, esp in AM, or being dehydrated worsens the spasms.   Movements sometimes associated with phasic spasms involving the legs.    She also has left > right arm/hand weakness,    The left arm is 2-/5 mostly and right is 4-/5 mostly.   ? ?She has urinary incontinence but has some voluntary control at times.   Vision is symmetric..   ? ?For spasticity has been a problem and she is on Dantrium 50 mg 3 times a day and baclofen 20 mg 4 times a day.  ? ?She notes some fatigue.  She denies depression at this time.  Cognition is usually fine  but has had issues when she has an infection.    She takes fluoxetine 60 mg daily.   ? ?She had the Covid vaccinations and boosters. ? ? ? ?MS History:  She presented with a Lhermite's syndrome in 1988 or 1989.   MRIs of the head and neck were performed. They showed lesions consistent with the diagnosis of multiple sclerosis. Lumbar puncture was not necessary. Over the next 15 years, she would get some exacerbations and have courses of steroids. However, a disease modifying therapy was not prescribed. By 2004, she had difficulty with her gait and would often have to lean on somebody for support.  She started Copaxone that year and stayed on it for about one half years. She stopped due to a lot of itching. She then switched to Rebif. While on Copaxone Rebif she continues to have exacerbations and further difficulties with her gait. About 2012 she switched to Aubagio. She tolerated Aubagio well but progressed.  Tysabri was started in February 2019 after relapse.  Unfortunately she converted from JCV negative to positive.  She felt she did well on Tysabri with some slowing of her progression as well as improved fatigue.  Due to the elevated JCV, she switched to Vumerity in 2021. ? ?  MRI 10/11/2016 showed:  Confluent white matter disease throughout the cerebral hemispheres consistent with chronic multiple sclerosis. ?Several foci show restricted diffusion, but no contrast enhancement.  No new lesions compared to January 2018. ? ?MRI cervical spine 08/16/2016 showed:  C5 myelomalacia, additional spinal cord plaques consistent with chronic ?demyelination, relatively similar though limited assessment due to patient motion. ? ?MRI brain 08/16/2016 showed:  Numerous foci of reduced diffusion most consistent with hyperacute/active ?demyelination, less likely infection/septic emboli. Severe chronic demyelination including supra and infratentorial white matter, ?cortex and deep gray nuclei lesions.  Moderate parenchymal brain volume  loss for age. ? ?REVIEW OF SYSTEMS:  ?Constitutional: No fevers, chills, sweats, or change in appetite.  Has fatigue and poor slep ?Eyes: see above.   No eye pain ?Ear, nose and throat: No hearing loss, ear pain, nasal congestion, sore throat ?Cardiovascular: No chest pain, palpitations ?Respiratory:  No shortness of breath at rest or with exertion.   No wheezes ?GastrointestinaI: Has UC - doing well.   No nausea, vomiting, diarrhea, abdominal pain, fecal incontinence ?Genitourinary:  see above ?Musculoskeletal:  No neck pain, back pain ?Integumentary: No rash, pruritus, skin lesions ?Neurological: as above ?Psychiatric: as above. ?Endocrine: No palpitations, diaphoresis, change in appetite, change in weigh or increased thirst ?Hematologic/Lymphatic:  No anemia, purpura, petechiae. ?Allergic/Immunologic: No itchy/runny eyes, nasal congestion, recent allergic reactions, rashes ? ?ALLERGIES: ?Allergies  ?Allergen Reactions  ? Nitrofurantoin Other (See Comments)  ?  syncope  ? Oxycodone-Acetaminophen Other (See Comments)  ?  Other  ? Risperidone And Related   ?  Became very lethargic  ? Penicillins Rash  ?  Has patient had a PCN reaction causing immediate rash, facial/tongue/throat swelling, SOB or lightheadedness with hypotension: NO ?Has patient had a PCN reaction causing severe rash involving mucus membranes or skin necrosis: NO ?Has patient had a PCN reaction that required hospitalization NO ?Has patient had a PCN reaction occurring within the last 10 years:NO ?If all of the above answers are "NO", then may proceed with Cephalosporin use.  ? ? ?HOME MEDICATIONS: ?Outpatient Medications Prior to Visit  ?Medication Sig Dispense Refill  ? acetaminophen (TYLENOL) 500 MG tablet Take 1 tablet by mouth as needed.    ? acyclovir (ZOVIRAX) 400 MG tablet Take 400 mg by mouth daily. Take every day per patient  0  ? AMITIZA 8 MCG capsule Take 8 mcg by mouth 3 (three) times daily. GI MD prescribing- will take 2 caps BID prn  1   ? aspirin EC 81 MG tablet Take 81 mg by mouth daily.    ? b complex vitamins tablet Take 1 tablet by mouth daily.     ? baclofen (LIORESAL) 20 MG tablet TAKE 1 TABLET BY MOUTH UP TO 4 TIMES DAILY 360 tablet 1  ? cholecalciferol (VITAMIN D) 1000 UNITS tablet Take 1,000 Units by mouth daily.     ? dantrolene (DANTRIUM) 50 MG capsule TAKE 1 CAPSULE (50 MG TOTAL) BY MOUTH 3 (THREE) TIMES DAILY. 270 capsule 3  ? famotidine (PEPCID) 20 MG tablet Take 40 mg by mouth 2 (two) times daily.    ? FLUoxetine (PROZAC) 10 MG capsule Take 5 capsules (50 mg total) by mouth daily. 841 capsule 0  ? folic acid (FOLVITE) 660 MCG tablet Take 400 mcg by mouth daily.     ? furosemide (LASIX) 40 MG tablet Take 1 tablet (40 mg total) by mouth 2 (two) times daily. 30 tablet   ? labetalol (NORMODYNE) 100 MG tablet Take 100 mg  by mouth daily.    ? lidocaine-prilocaine (EMLA) cream Apply one inch before port-a cath access prn 30 g 3  ? mesalamine (PENTASA) 500 MG CR capsule Take 1,000 mg by mouth 3 (three) times daily.     ? mometasone (ELOCON) 0.1 % ointment Apply 1 application topically 2 (two) times daily.    ? Multiple Vitamin (MULTIVITAMIN) tablet Take 1 tablet by mouth daily.    ? OXcarbazepine (TRILEPTAL) 150 MG tablet TAKE 1 TABLET BY MOUTH THREE TIMES A DAY 270 tablet 1  ? potassium chloride (KLOR-CON) 10 MEQ tablet Take 10 mEq by mouth daily.    ? QUEtiapine (SEROQUEL) 25 MG tablet Take 1 tablet (25 mg total) by mouth at bedtime. 30 tablet 0  ? sodium chloride (OCEAN) 0.65 % SOLN nasal spray Place 1 spray into both nostrils as needed for congestion.  0  ? tolterodine (DETROL) 2 MG tablet Take 2 mg by mouth 2 (two) times daily.   1  ? betamethasone dipropionate (DIPROLENE) 0.05 % ointment Apply topically.    ? POTASSIUM CHLORIDE ER PO Take by mouth. Dose unknown    ? ?No facility-administered medications prior to visit.  ? ? ?PAST MEDICAL HISTORY: ?Past Medical History:  ?Diagnosis Date  ? Headache   ? History of kidney stones   ?  Hypertension   ? Kidney stones   ? Movement disorder   ? Multiple sclerosis ([place])   ? Neuropathy   ? Ulcerative colitis ([place])   ? Vision abnormalities   ? ? ?PAST SURGICAL HISTORY: ?Past Surgical History

## 2021-11-15 ENCOUNTER — Telehealth: Payer: Self-pay | Admitting: *Deleted

## 2021-11-15 NOTE — Telephone Encounter (Signed)
Per Kim/[facility]fusion: they had trouble accessing pt port today. Was able to pull back and waste but nothing after that. annot Pt saw Dr. Felecia Shelling 11/07/21 who ordered labs to determine DMT therpy. Maudie Mercury states they have to resubmit auth after today for port flushes. She should be able to come back in a couple weeks to retry. Pt does not want to be stuck peripherally per Maudie Mercury. ? ?Dr. Felecia Shelling approved pt to come back in two weeks to try for redraw. ?

## 2021-11-22 ENCOUNTER — Other Ambulatory Visit: Payer: Self-pay | Admitting: Neurology

## 2021-11-30 ENCOUNTER — Other Ambulatory Visit: Payer: Self-pay | Admitting: *Deleted

## 2021-11-30 ENCOUNTER — Other Ambulatory Visit (INDEPENDENT_AMBULATORY_CARE_PROVIDER_SITE_OTHER): Payer: Self-pay

## 2021-11-30 DIAGNOSIS — Z79899 Other long term (current) drug therapy: Secondary | ICD-10-CM

## 2021-11-30 DIAGNOSIS — R29898 Other symptoms and signs involving the musculoskeletal system: Secondary | ICD-10-CM

## 2021-11-30 DIAGNOSIS — Z0289 Encounter for other administrative examinations: Secondary | ICD-10-CM

## 2021-11-30 DIAGNOSIS — G35 Multiple sclerosis: Secondary | ICD-10-CM

## 2021-11-30 DIAGNOSIS — E559 Vitamin D deficiency, unspecified: Secondary | ICD-10-CM

## 2021-11-30 DIAGNOSIS — E785 Hyperlipidemia, unspecified: Secondary | ICD-10-CM

## 2021-12-01 LAB — COMPREHENSIVE METABOLIC PANEL
ALT: 28 IU/L (ref 0–32)
AST: 15 IU/L (ref 0–40)
Albumin/Globulin Ratio: 1.9 (ref 1.2–2.2)
Albumin: 4.2 g/dL (ref 3.8–4.9)
Alkaline Phosphatase: 113 IU/L (ref 44–121)
BUN/Creatinine Ratio: 19 (ref 9–23)
BUN: 17 mg/dL (ref 6–24)
Bilirubin Total: 0.3 mg/dL (ref 0.0–1.2)
CO2: 23 mmol/L (ref 20–29)
Calcium: 9.3 mg/dL (ref 8.7–10.2)
Chloride: 106 mmol/L (ref 96–106)
Creatinine, Ser: 0.9 mg/dL (ref 0.57–1.00)
Globulin, Total: 2.2 g/dL (ref 1.5–4.5)
Glucose: 119 mg/dL — ABNORMAL HIGH (ref 70–99)
Potassium: 4.5 mmol/L (ref 3.5–5.2)
Sodium: 146 mmol/L — ABNORMAL HIGH (ref 134–144)
Total Protein: 6.4 g/dL (ref 6.0–8.5)
eGFR: 75 mL/min/{1.73_m2} (ref >59)

## 2021-12-01 LAB — CBC WITH DIFFERENTIAL/PLATELET
Basophils Absolute: 0.1 10*3/uL (ref 0.0–0.2)
Basos: 1 %
EOS (ABSOLUTE): 0.2 10*3/uL (ref 0.0–0.4)
Eos: 3 %
Hematocrit: 40.9 % (ref 34.0–46.6)
Hemoglobin: 14.1 g/dL (ref 11.1–15.9)
Immature Grans (Abs): 0 10*3/uL (ref 0.0–0.1)
Immature Granulocytes: 0 %
Lymphocytes Absolute: 0.7 10*3/uL (ref 0.7–3.1)
Lymphs: 10 %
MCH: 32.9 pg (ref 26.6–33.0)
MCHC: 34.5 g/dL (ref 31.5–35.7)
MCV: 96 fL (ref 79–97)
Monocytes Absolute: 0.8 10*3/uL (ref 0.1–0.9)
Monocytes: 11 %
Neutrophils Absolute: 5.4 10*3/uL (ref 1.4–7.0)
Neutrophils: 75 %
Platelets: 130 10*3/uL — ABNORMAL LOW (ref 150–450)
RBC: 4.28 x10E6/uL (ref 3.77–5.28)
RDW: 12.2 % (ref 11.7–15.4)
WBC: 7.1 10*3/uL (ref 3.4–10.8)

## 2021-12-01 LAB — HEMOGLOBIN A1C
Est. average glucose Bld gHb Est-mCnc: 100 mg/dL
Hgb A1c MFr Bld: 5.1 % (ref 4.8–5.6)

## 2021-12-01 LAB — LIPID PANEL
Chol/HDL Ratio: 3.1 ratio (ref 0.0–4.4)
Cholesterol, Total: 154 mg/dL (ref 100–199)
HDL: 50 mg/dL (ref >39)
LDL Chol Calc (NIH): 81 mg/dL (ref 0–99)
Triglycerides: 132 mg/dL (ref 0–149)
VLDL Cholesterol Cal: 23 mg/dL (ref 5–40)

## 2021-12-01 LAB — TSH: TSH: 4.99 u[IU]/mL — ABNORMAL HIGH (ref 0.450–4.500)

## 2021-12-01 LAB — VITAMIN D 25 HYDROXY (VIT D DEFICIENCY, FRACTURES): Vit D, 25-Hydroxy: 32.5 ng/mL (ref 30.0–100.0)

## 2022-01-30 ENCOUNTER — Telehealth: Payer: Self-pay | Admitting: Neurology

## 2022-01-30 NOTE — Telephone Encounter (Signed)
Rescheduled 10/5 appt with pt over the phone- Dr. Felecia Shelling out.

## 2022-02-28 ENCOUNTER — Encounter: Payer: Self-pay | Admitting: Neurology

## 2022-02-28 ENCOUNTER — Ambulatory Visit (INDEPENDENT_AMBULATORY_CARE_PROVIDER_SITE_OTHER): Payer: Managed Care, Other (non HMO) | Admitting: Neurology

## 2022-02-28 VITALS — BP 125/73 | HR 74 | Ht 63.0 in

## 2022-02-28 DIAGNOSIS — R29898 Other symptoms and signs involving the musculoskeletal system: Secondary | ICD-10-CM

## 2022-02-28 DIAGNOSIS — G8250 Quadriplegia, unspecified: Secondary | ICD-10-CM | POA: Diagnosis not present

## 2022-02-28 DIAGNOSIS — G35 Multiple sclerosis: Secondary | ICD-10-CM | POA: Diagnosis not present

## 2022-02-28 DIAGNOSIS — D72810 Lymphocytopenia: Secondary | ICD-10-CM

## 2022-02-28 DIAGNOSIS — R262 Difficulty in walking, not elsewhere classified: Secondary | ICD-10-CM | POA: Diagnosis not present

## 2022-02-28 DIAGNOSIS — Z95828 Presence of other vascular implants and grafts: Secondary | ICD-10-CM

## 2022-02-28 DIAGNOSIS — Z79899 Other long term (current) drug therapy: Secondary | ICD-10-CM

## 2022-02-28 NOTE — Addendum Note (Signed)
Addended by: Britt Bottom on: 02/28/2022 05:41 PM   Modules accepted: Orders

## 2022-02-28 NOTE — Progress Notes (Addendum)
[facility]  [name] DOB: Jul 14, 1966  REFERRING CLINICIAN: Adah Salvage, [facility] HISTORY FROM: patient    REASON FOR VISIT: MS   HISTORICAL  CHIEF COMPLAINT:  Chief Complaint  Patient presents with   Follow-up    RM 1. Last seen 11/07/21. Needing new PWC. Current one 10/57 yr old. Has new insurance this yr.     HISTORY OF PRESENT ILLNESS:  Vanessa Short is a 56 y.o. woman diagnosed with relapsing remitting MS in 1989.   She currently has a relapsing/active form of second progressive MS  Update 02/28/2022: She is here today for a mobility evaluation: Current mobility impairment:  She has severe weakness due to her multiple sclerosis.  She has almost complete loss of strength in her legs as well as significant weakness in the left arm.  Due to the severe weakness, she cannot use a cane or walker.  She is unable to self propel in a manual wheelchair due to the weakness in the arms.  Currently she is in a power wheelchair with recline/tilt function and lift/elevation mode with power leg elevation.    Due to left > right arm weakness the joystick is on the right.  The chair also has back controls for a caregiver to help with her mobility.  Her current wheelchair is showing signs of wear.  Activities of daily living: She has difficulty with multiple ADL's due to poor mobility.  Her ability to do these tasks is helped by having a power wheelchair with additional features (tilt/recline, lift, leg elevation).      Without a power wheelchair she is unable to go room to room for self care and simple chores.     With the chair, she can go from room to room within the house to compress her activities of daily living such as meal prep, bathroom and simple chores.  Need for additional features: She is in her chair all day and in a recliner to sleep at night .  She is unable to take any steps but can transfer with assistance using a sit to  stand apparatus.  Because she spends the entire day in the wheelchair, it is necessary for her to have tilt/recline function and leg elevation features.  As she sometimes needs to take naps she needs to be able to recline flat.  This also is beneficial when she goes to doctor appointments to assist with the examination.  She needs a lift function to do chores at different heights.  MS: She was diagnosed with MS 1989.  Unfortunately, she has had progressive difficulties with weakness in both legs and in the left arm more than the right arm..  She has not had any recent exacerbation.  Due to her low lymphocyte count her Vumerity is currently being held.  Currently, strength is 0-1/5 in the legs and 2/5 in the left arm and 2+ to 4 -/5 in the right arm. She has urinary incontinence but has some voluntary control at times.   Vision is symmetric..  For spasticity has been a problem and she is on Dantrium 50 mg 3 times a day and baclofen 20 mg 4 times a day.   She notes some fatigue.  She denies depression at this time.  Cognition is usually fine but has had issues when she has an infection.    She takes fluoxetine 60 mg daily.      MS History:  She presented with a Lhermite's syndrome in  1988 or 1989.   MRIs of the head and neck were performed. They showed lesions consistent with the diagnosis of multiple sclerosis. Lumbar puncture was not necessary. Over the next 15 years, she would get some exacerbations and have courses of steroids. However, a disease modifying therapy was not prescribed. By 2004, she had difficulty with her gait and would often have to lean on somebody for support.  She started Copaxone that year and stayed on it for about one half years. She stopped due to a lot of itching. She then switched to Rebif. While on Copaxone Rebif she continues to have exacerbations and further difficulties with her gait. About 2012 she switched to Aubagio. She tolerated Aubagio well but progressed.  Tysabri was  started in February 2019 after relapse.  Unfortunately she converted from JCV negative to positive.  She felt she did well on Tysabri with some slowing of her progression as well as improved fatigue.  Due to the elevated JCV, she switched to Vumerity in 2021.  MRI 10/11/2016 showed:  Confluent white matter disease throughout the cerebral hemispheres consistent with chronic multiple sclerosis. Several foci show restricted diffusion, but no contrast enhancement.  No new lesions compared to January 2018.  MRI cervical spine 08/16/2016 showed:  C5 myelomalacia, additional spinal cord plaques consistent with chronic demyelination, relatively similar though limited assessment due to patient motion.  MRI brain 08/16/2016 showed:  Numerous foci of reduced diffusion most consistent with hyperacute/active demyelination, less likely infection/septic emboli. Severe chronic demyelination including supra and infratentorial white matter, cortex and deep gray nuclei lesions.  Moderate parenchymal brain volume loss for age.  REVIEW OF SYSTEMS:  Constitutional: No fevers, chills, sweats, or change in appetite.  Has fatigue and poor slep Eyes: see above.   No eye pain Ear, nose and throat: No hearing loss, ear pain, nasal congestion, sore throat Cardiovascular: No chest pain, palpitations Respiratory:  No shortness of breath at rest or with exertion.   No wheezes GastrointestinaI: Has UC - doing well.   No nausea, vomiting, diarrhea, abdominal pain, fecal incontinence Genitourinary:  see above Musculoskeletal:  No neck pain, back pain Integumentary: No rash, pruritus, skin lesions Neurological: as above Psychiatric: as above. Endocrine: No palpitations, diaphoresis, change in appetite, change in weigh or increased thirst Hematologic/Lymphatic:  No anemia, purpura, petechiae. Allergic/Immunologic: No itchy/runny eyes, nasal congestion, recent allergic reactions, rashes  ALLERGIES: Allergies  Allergen Reactions    Nitrofurantoin Other (See Comments)    syncope   Oxycodone-Acetaminophen Other (See Comments)    Other   Risperidone And Related     Became very lethargic   Penicillins Rash    Has patient had a PCN reaction causing immediate rash, facial/tongue/throat swelling, SOB or lightheadedness with hypotension: NO Has patient had a PCN reaction causing severe rash involving mucus membranes or skin necrosis: NO Has patient had a PCN reaction that required hospitalization NO Has patient had a PCN reaction occurring within the last 10 years:NO If all of the above answers are "NO", then may proceed with Cephalosporin use.    HOME MEDICATIONS: Outpatient Medications Prior to Visit  Medication Sig Dispense Refill   acetaminophen (TYLENOL) 500 MG tablet Take 1,000 mg by mouth at bedtime.     acyclovir (ZOVIRAX) 400 MG tablet Take 400 mg by mouth daily. Take every day per patient  0   AMITIZA 8 MCG capsule Take 8 mcg by mouth 3 (three) times daily. GI MD prescribing- will take 2 caps BID prn  1  aspirin EC 81 MG tablet Take 81 mg by mouth daily.     b complex vitamins tablet Take 1 tablet by mouth daily.      baclofen (LIORESAL) 20 MG tablet One po qid 360 tablet 3   cholecalciferol (VITAMIN D) 1000 UNITS tablet Take 1,000 Units by mouth daily.      dantrolene (DANTRIUM) 50 MG capsule Take 1 capsule (50 mg total) by mouth in the morning, at noon, in the evening, and at bedtime. 360 capsule 3   famotidine (PEPCID) 20 MG tablet Take 40 mg by mouth 2 (two) times daily.     FLUoxetine (PROZAC) 10 MG capsule Take 5 capsules (50 mg total) by mouth daily. 017 capsule 0   folic acid (FOLVITE) 510 MCG tablet Take 400 mcg by mouth daily.      furosemide (LASIX) 40 MG tablet Take 1 tablet (40 mg total) by mouth 2 (two) times daily. 30 tablet    labetalol (NORMODYNE) 100 MG tablet Take 0.5 tablets (50 mg total) by mouth daily. 45 tablet 3   lidocaine-prilocaine (EMLA) cream Apply one inch before port-a cath  access prn 30 g 3   mesalamine (PENTASA) 500 MG CR capsule Take 1,000 mg by mouth 4 (four) times daily.     mometasone (ELOCON) 0.1 % ointment Apply 1 application topically 2 (two) times daily.     Multiple Vitamin (MULTIVITAMIN) tablet Take 1 tablet by mouth daily.     OXcarbazepine (TRILEPTAL) 150 MG tablet Take 1 tablet (150 mg total) by mouth 3 (three) times daily. 270 tablet 3   potassium chloride (KLOR-CON) 10 MEQ tablet Take 10 mEq by mouth daily.     QUEtiapine (SEROQUEL) 25 MG tablet Take 1 tablet (25 mg total) by mouth at bedtime. 30 tablet 0   sodium chloride (OCEAN) 0.65 % SOLN nasal spray Place 1 spray into both nostrils as needed for congestion.  0   tolterodine (DETROL) 2 MG tablet Take 2 mg by mouth 2 (two) times daily.   1   No facility-administered medications prior to visit.    PAST MEDICAL HISTORY: Past Medical History:  Diagnosis Date   Headache    History of kidney stones    Hypertension    Kidney stones    Movement disorder    Multiple sclerosis ([place])    Neuropathy    Ulcerative colitis ([place])    Vision abnormalities     PAST SURGICAL HISTORY: Past Surgical History:  Procedure Laterality Date   ENDOMETRIAL ABLATION  10/10/2017   KIDNEY STONE SURGERY     PORTACATH PLACEMENT N/A 10/16/2017   Procedure: ULTRA SOUND GUIDED INSERTION PORT-A-CATH ERAS PATHWAY;  Surgeon: Mickeal Skinner, MD;  Location: [facility];  Service: General;  Laterality: N/A;    FAMILY HISTORY: Family History  Problem Relation Age of Onset   Dementia Mother    Hypertension Father    Hyperlipidemia Father    Diabetes Father    Heart disease Father    Breast cancer Neg Hx     SOCIAL HISTORY:  Social History   Socioeconomic History   Marital status: Married    Spouse name: Not on file   Number of children: Not on file   Years of education: Not on file   Highest education level: Not on file  Occupational History   Not on file  Tobacco Use   Smoking status: Never    Smokeless tobacco: Never  Vaping Use   Vaping Use: Never used  Substance  and Sexual Activity   Alcohol use: Yes    Comment: occasional glass of wine   Drug use: No   Sexual activity: Not on file  Other Topics Concern   Not on file  Social History Narrative   ** Merged History Encounter **       Social Determinants of Health   Financial Resource Strain: Not on file  Food Insecurity: Not on file  Transportation Needs: Not on file  Physical Activity: Not on file  Stress: Not on file  Social Connections: Not on file  Intimate Partner Violence: Not on file     PHYSICAL EXAM  Vitals:   02/28/22 1254  BP: 125/73  Pulse: 74  SpO2: 97%  Height: 5' 3"  (1.6 m)   Weight = 175 pounds.   Patient is right handed.     Body mass index is 31 kg/m.   General: The patient is well-developed and well-nourished and in no acute distress.  She is in her electric wheelchair.  Skin/extremities: Extremities are without rash.  She has edema at the ankles  Neurologic Exam  Mental status: The patient is alert and oriented.  Focus and attention was reduced.  Speech is normal.  Cranial nerves: Extraocular movements are full.   Facial strength and sensation is normal.  Trapezius strength is normal.  There is no dysarthria.  No obvious hearing deficits are noted.  Motor:  Muscle bulk is normal .  Muscle tone is mildly increased in the legs, right greater than left.  She has increased muscle tone in the arms, left >>right   She has 0/5 strength in the legs.. Strength is 1 to 2-/5 in the left arm and 2+ to 3/5 in the right arm proximal arm and 4/5 finger and wrist flexion.  She is unable to write.  She is able to operate her joystick on the right..   Sensory: She appears to have symmetric vibration sensation in the arms and reduced vibration sensation at the knees.  Gait and station: She can not stand or walk.    Reflexes: She has increased reflexes in her legs.    DIAGNOSTIC DATA (LABS,  IMAGING, TESTING) - I reviewed patient records, labs, notes, testing and imaging myself where available.  Lab Results  Component Value Date   WBC 7.1 11/30/2021   HGB 14.1 11/30/2021   HCT 40.9 11/30/2021   MCV 96 11/30/2021   PLT 130 (L) 11/30/2021      Component Value Date/Time   NA 146 (H) 11/30/2021 1341   K 4.5 11/30/2021 1341   CL 106 11/30/2021 1341   CO2 23 11/30/2021 1341   GLUCOSE 119 (H) 11/30/2021 1341   GLUCOSE 94 07/30/2018 0550   BUN 17 11/30/2021 1341   CREATININE 0.90 11/30/2021 1341   CALCIUM 9.3 11/30/2021 1341   PROT 6.4 11/30/2021 1341   ALBUMIN 4.2 11/30/2021 1341   AST 15 11/30/2021 1341   ALT 28 11/30/2021 1341   ALKPHOS 113 11/30/2021 1341   BILITOT 0.3 11/30/2021 1341   GFRNONAA 69 03/16/2020 1502   GFRAA 79 03/16/2020 1502       ASSESSMENT AND PLAN  Multiple sclerosis (HCC) - Plan: CBC with Differential/Platelet, Comprehensive metabolic panel  Spastic quadriparesis (HCC)  Unable to walk  Hand weakness  High risk medication use - Plan: CBC with Differential/Platelet, Comprehensive metabolic panel  Port-A-Cath in place  Lymphopenia   Due to severe impairments of mobility, she needs an electric wheelchair.  She does not have the  leg strength to use a cane or walker.  She is unable to self propel a manual wheelchair due to left greater than right arm weakness.  She cannot use a scooter due to need for joystick control, smaller turning radius and advanced features of active wheelchair..  She has inability to operate this type of electric wheelchair with right hand control.  She is motivated to do so. I recommend that she obtain a wheelchair similar to her current chair but also with lay flat ability in case unable to transfer or needed for doctor's appt.   Patient comes back to have her port flush we will check the CBC with differential and determine if we can restart a disease modifying therapy.   Port will be flushed about every 4-6 weeks.   4.   Oxcarbazepine for dysesthesias 5.   rtc 6 months, sooner if ne or worsening neurologic issues  42-minute office visit with the majority of the time spent face-to-face for history and physical, discussion/counseling and decision-making.  Additional time with record review and documentation of mobility visit. Nanine Means. Felecia Shelling, MD, PhD 8/46/9629, 5:28 PM Certified in Neurology, Clinical Neurophysiology, Sleep Medicine, Pain Medicine and Neuroimaging  [facility] [address], [place], [place] [address] [phone]  Addendum 09/08/20:  Fixed error in history - correct statement should be she switched from Tysabri to [place]. -- RAS

## 2022-03-01 NOTE — Progress Notes (Signed)
Faxed order for new PWC/office notes to [employer], attn: Deberah Pelton at [phone]. Received fax confirmation.

## 2022-03-22 ENCOUNTER — Other Ambulatory Visit: Payer: Self-pay | Admitting: *Deleted

## 2022-03-22 ENCOUNTER — Other Ambulatory Visit (INDEPENDENT_AMBULATORY_CARE_PROVIDER_SITE_OTHER): Payer: Self-pay

## 2022-03-22 DIAGNOSIS — G35D Multiple sclerosis, unspecified: Secondary | ICD-10-CM

## 2022-03-22 DIAGNOSIS — R899 Unspecified abnormal finding in specimens from other organs, systems and tissues: Secondary | ICD-10-CM

## 2022-03-22 DIAGNOSIS — Z0289 Encounter for other administrative examinations: Secondary | ICD-10-CM

## 2022-03-22 DIAGNOSIS — G35 Multiple sclerosis: Secondary | ICD-10-CM

## 2022-03-22 DIAGNOSIS — Z79899 Other long term (current) drug therapy: Secondary | ICD-10-CM

## 2022-03-23 LAB — CBC WITH DIFFERENTIAL/PLATELET
Basophils Absolute: 0.1 10*3/uL (ref 0.0–0.2)
Basos: 1 %
EOS (ABSOLUTE): 0.2 10*3/uL (ref 0.0–0.4)
Eos: 2 %
Hematocrit: 40 % (ref 34.0–46.6)
Hemoglobin: 13.7 g/dL (ref 11.1–15.9)
Immature Grans (Abs): 0 10*3/uL (ref 0.0–0.1)
Immature Granulocytes: 0 %
Lymphocytes Absolute: 0.6 10*3/uL — ABNORMAL LOW (ref 0.7–3.1)
Lymphs: 7 %
MCH: 32.4 pg (ref 26.6–33.0)
MCHC: 34.3 g/dL (ref 31.5–35.7)
MCV: 95 fL (ref 79–97)
Monocytes Absolute: 0.6 10*3/uL (ref 0.1–0.9)
Monocytes: 8 %
Neutrophils Absolute: 6.4 10*3/uL (ref 1.4–7.0)
Neutrophils: 82 %
Platelets: 114 10*3/uL — ABNORMAL LOW (ref 150–450)
RBC: 4.23 x10E6/uL (ref 3.77–5.28)
RDW: 12 % (ref 11.7–15.4)
WBC: 7.8 10*3/uL (ref 3.4–10.8)

## 2022-03-23 LAB — LIPID PANEL
Chol/HDL Ratio: 3.1 ratio (ref 0.0–4.4)
Cholesterol, Total: 137 mg/dL (ref 100–199)
HDL: 44 mg/dL (ref >39)
LDL Chol Calc (NIH): 65 mg/dL (ref 0–99)
Triglycerides: 165 mg/dL — ABNORMAL HIGH (ref 0–149)
VLDL Cholesterol Cal: 28 mg/dL (ref 5–40)

## 2022-03-23 LAB — COMPREHENSIVE METABOLIC PANEL
ALT: 22 IU/L (ref 0–32)
AST: 16 IU/L (ref 0–40)
Albumin/Globulin Ratio: 2.4 — ABNORMAL HIGH (ref 1.2–2.2)
Albumin: 4.3 g/dL (ref 3.8–4.9)
Alkaline Phosphatase: 98 IU/L (ref 44–121)
BUN/Creatinine Ratio: 22 (ref 9–23)
BUN: 21 mg/dL (ref 6–24)
Bilirubin Total: 0.3 mg/dL (ref 0.0–1.2)
CO2: 21 mmol/L (ref 20–29)
Calcium: 9 mg/dL (ref 8.7–10.2)
Chloride: 104 mmol/L (ref 96–106)
Creatinine, Ser: 0.95 mg/dL (ref 0.57–1.00)
Globulin, Total: 1.8 g/dL (ref 1.5–4.5)
Glucose: 182 mg/dL — ABNORMAL HIGH (ref 70–99)
Potassium: 3.6 mmol/L (ref 3.5–5.2)
Sodium: 142 mmol/L (ref 134–144)
Total Protein: 6.1 g/dL (ref 6.0–8.5)
eGFR: 70 mL/min/{1.73_m2} (ref >59)

## 2022-03-23 LAB — HEMOGLOBIN A1C
Est. average glucose Bld gHb Est-mCnc: 100 mg/dL
Hgb A1c MFr Bld: 5.1 % (ref 4.8–5.6)

## 2022-03-23 LAB — TSH: TSH: 4.32 u[IU]/mL (ref 0.450–4.500)

## 2022-03-27 ENCOUNTER — Encounter: Payer: Self-pay | Admitting: Neurology

## 2022-05-17 ENCOUNTER — Ambulatory Visit: Payer: Medicare Other | Admitting: Neurology

## 2022-05-18 ENCOUNTER — Ambulatory Visit: Payer: Medicare Other | Admitting: Neurology

## 2022-06-14 NOTE — Telephone Encounter (Signed)
Faxed signed orders for electric wheelchair back to [employer] at [phone]. Received fax confirmation.

## 2022-07-31 ENCOUNTER — Emergency Department (HOSPITAL_COMMUNITY)
Admission: EM | Admit: 2022-07-31 | Discharge: 2022-07-31 | Disposition: A | Discharge status: Home / Self Care | Payer: Managed Care, Other (non HMO) | Attending: Emergency Medicine | Admitting: Emergency Medicine

## 2022-07-31 ENCOUNTER — Other Ambulatory Visit: Payer: Self-pay

## 2022-07-31 ENCOUNTER — Emergency Department (HOSPITAL_COMMUNITY): Payer: Managed Care, Other (non HMO)

## 2022-07-31 DIAGNOSIS — I509 Heart failure, unspecified: Secondary | ICD-10-CM | POA: Insufficient documentation

## 2022-07-31 DIAGNOSIS — M25512 Pain in left shoulder: Secondary | ICD-10-CM | POA: Diagnosis not present

## 2022-07-31 DIAGNOSIS — M25522 Pain in left elbow: Secondary | ICD-10-CM | POA: Diagnosis not present

## 2022-07-31 DIAGNOSIS — M79604 Pain in right leg: Secondary | ICD-10-CM | POA: Insufficient documentation

## 2022-07-31 DIAGNOSIS — W050XXA Fall from non-moving wheelchair, initial encounter: Secondary | ICD-10-CM | POA: Insufficient documentation

## 2022-07-31 DIAGNOSIS — M79602 Pain in left arm: Secondary | ICD-10-CM | POA: Diagnosis not present

## 2022-07-31 DIAGNOSIS — Y92009 Unspecified place in unspecified non-institutional (private) residence as the place of occurrence of the external cause: Secondary | ICD-10-CM | POA: Insufficient documentation

## 2022-07-31 DIAGNOSIS — Z79899 Other long term (current) drug therapy: Secondary | ICD-10-CM | POA: Diagnosis not present

## 2022-07-31 DIAGNOSIS — S42292A Other displaced fracture of upper end of left humerus, initial encounter for closed fracture: Secondary | ICD-10-CM

## 2022-07-31 DIAGNOSIS — S82831A Other fracture of upper and lower end of right fibula, initial encounter for closed fracture: Secondary | ICD-10-CM

## 2022-07-31 DIAGNOSIS — M25532 Pain in left wrist: Secondary | ICD-10-CM | POA: Diagnosis not present

## 2022-07-31 DIAGNOSIS — W19XXXA Unspecified fall, initial encounter: Secondary | ICD-10-CM

## 2022-07-31 DIAGNOSIS — Z7982 Long term (current) use of aspirin: Secondary | ICD-10-CM | POA: Diagnosis not present

## 2022-07-31 MED ORDER — TRAMADOL HCL 50 MG PO TABS
50.0000 mg | ORAL_TABLET | Freq: Once | ORAL | Status: AC
  Administered 2022-07-31: 50 mg via ORAL
  Filled 2022-07-31: qty 1

## 2022-07-31 MED ORDER — ACETAMINOPHEN 500 MG PO TABS
1000.0000 mg | ORAL_TABLET | Freq: Once | ORAL | Status: AC
  Administered 2022-07-31: 1000 mg via ORAL
  Filled 2022-07-31: qty 2

## 2022-07-31 MED ORDER — TRAMADOL HCL 50 MG PO TABS: 50.0000 mg | ORAL_TABLET | Freq: Four times a day (QID) | ORAL | 0 refills | Status: DC | PRN

## 2022-07-31 MED ORDER — DIAZEPAM 2 MG PO TABS
2.0000 mg | ORAL_TABLET | Freq: Once | ORAL | Status: AC
  Administered 2022-07-31: 2 mg via ORAL
  Filled 2022-07-31: qty 1

## 2022-07-31 NOTE — ED Provider Notes (Signed)
Care handoff from [facility], [name] at shift change. Please see their note for further information.    HPI:  Vanessa Short is a 56 y.o. female with h/o MS, spastic quadriparesis wheelchair-bound, ulcerative colitis, CHF presents the emergency department today after a fall.  Patient is wheelchair-bound but majority of the time.  She has been using a wheelchair for the past 14 years.  Patient was on the commode when her aide was using a sit to stand device to help transfer her back to the wheelchair.  She reports that she felt that something was off.  She reports that her left side of her body was off the wheelchair and she slowly slid to the ground against the wheelchair sitting up landing on the ground with her bottom.  She reports that her left arm was held above her head which is abnormal for her as she does not have this type of range of motion.  She reports that her right leg was bent behind her bottom as well.  She is complaining of pain to the right lower extremity and pain to the left upper extremity.  She reports that she heard and felt a "crack and pop".  She denies hitting her head.  Denies any loss of consciousness.  No blood thinner use.  At baseline, she does not have range of motion of her extremities.    Physical Exam  BP 124/60 (BP Location: Right Arm)   Pulse 72   Temp 98.1 F (36.7 C) (Oral)   Resp 16   Ht 5' 4"  (1.626 m)   Wt 79.4 kg   SpO2 100%   BMI 30.04 kg/m   Physical Exam Vitals and nursing note reviewed.  Constitutional:      General: She is not in acute distress.    Appearance: Normal appearance. She is normal weight. She is not ill-appearing, toxic-appearing or diaphoretic.  HENT:     Head: Normocephalic and atraumatic.  Eyes:     Extraocular Movements: Extraocular movements intact.     Pupils: Pupils are equal, round, and reactive to light.  Cardiovascular:     Rate and Rhythm: Normal rate.  Pulmonary:     Effort: Pulmonary effort is normal. No  respiratory distress.  Abdominal:     General: Abdomen is flat.     Palpations: Abdomen is soft.     Tenderness: There is no abdominal tenderness.  Musculoskeletal:        General: Normal range of motion.     Cervical back: Normal range of motion.     Comments: No tenderness to palpation of cervical, thoracic, or lumbar spine.  No step-offs, lesions, overlying skin changes, or deformity.  Radial, DP, and PT pulses intact and 2+ in bilateral upper and lower extremities.  Pain with ROM but no focal bony tenderness to the left humerus.  No crepitus or overlying skin changes.  Pain with ROM to the right lower leg as well as tenderness to palpation.  No overlying skin changes.  Skin:    General: Skin is warm and dry.  Neurological:     General: No focal deficit present.     Mental Status: She is alert.  Psychiatric:        Mood and Affect: Mood normal.        Behavior: Behavior normal.        ED Course / MDM    Medical Decision Making Amount and/or Complexity of Data Reviewed Radiology: ordered.  Risk OTC drugs.  Prescription drug management.   Plan: X-ray imaging pending at shift change. After these result patient will need more thorough exam that was initially unable to be performed due to patient being in a hallway bed.   X-ray imaging of the right ankle, right tibia/fibula, right knee, right femur, left shoulder, left humerus, left elbow, left forearm, left wrist, pelvis reveals:  DG right knee:   1. Displaced transverse fracture proximal fibula. 2. Transverse fracture of the proximal tibial metaphysis.  DG left shoulder:  Mild cortical regularity of the lateral aspect of the humeral head and humeral neck which may represent a nondisplaced fracture in this location.  No other acute findings on imaging.  I have personally reviewed and interpreted this imaging and agree with radiology interpretation.  Discussed findings with orthopedics on call Dr. Alvan Dame who reviewed the  patients imaging and recommends knee immobilizer with cast padding and ace wrap with extra padding focused on the posterior calf to avoid pressure sores. Order placed for same.  Patient placed in knee immobilizer and left shoulder sling. Given referral to ortho outpatient. Patient does have a oxycodone intolerance, will send for Tramadol. PDMP reviewed. Offered admission or TOC consult, however patient's pain is controlled and husband states they have hoyer lift at home to help with transfers and he feels they should be able to manage at home. She is therefore stable for discharge, educated on red flag symptoms that would prompt immediate return. Patient understanding and amenable with plan, discharged in stable condition.   This is a shared visit with supervising physician Dr. Francia Greaves who has independently evaluated patient & provided guidance in evaluation/management/disposition, in agreement with care        Bud Face, [name] 07/31/22 1903    Valarie Merino, MD 07/31/22 [phone]

## 2022-07-31 NOTE — ED Provider Notes (Signed)
[facility] Provider Note   CSN: [id number] Arrival date & time: 07/31/22  1329     History Chief Complaint  Patient presents with   Fall    [facility] is a 56 y.o. female with h/o MS, spastic quadriparesis wheelchair-bound, ulcerative colitis, CHF presents the emergency department today after a fall.  Patient is wheelchair-bound but majority of the time.  She has been using a wheelchair for the past 14 years.  Patient was on the commode when her aide was using a sit to stand device to help transfer her back to the wheelchair.  She reports that she felt that something was off.  She reports that her left side of her body was off the wheelchair and she slowly slid to the ground against the wheelchair sitting up landing on the ground with her bottom.  She reports that her left arm was held above her head which is abnormal for her as she does not have this type of range of motion.  She reports that her right leg was bent behind her bottom as well.  She is complaining of pain to the right lower extremity and pain to the left upper extremity.  She reports that she heard and felt a "crack and pop".  She denies hitting her head.  Denies any loss of consciousness.  No blood thinner use.  At baseline, she does not have range of motion of her extremities.   Fall Pertinent negatives include no chest pain, no abdominal pain, no headaches and no shortness of breath.       Home Medications Prior to Admission medications   Medication Sig Start Date End Date Taking? Authorizing Provider  acetaminophen (TYLENOL) 500 MG tablet Take 1,000 mg by mouth at bedtime.    [provider]  acyclovir (ZOVIRAX) 400 MG tablet Take 400 mg by mouth daily. Take every day per patient 08/11/14   [provider]  AMITIZA 8 MCG capsule Take 8 mcg by mouth 3 (three) times daily. GI MD prescribing- will take 2 caps BID prn 06/11/18   [provider]   aspirin EC 81 MG tablet Take 81 mg by mouth daily.    [provider]  b complex vitamins tablet Take 1 tablet by mouth daily.     [provider]  baclofen (LIORESAL) 20 MG tablet One po qid 11/07/21   Sater, Nanine Means, MD  cholecalciferol (VITAMIN D) 1000 UNITS tablet Take 1,000 Units by mouth daily.     [provider]  dantrolene (DANTRIUM) 50 MG capsule Take 1 capsule (50 mg total) by mouth in the morning, at noon, in the evening, and at bedtime. 11/07/21   Sater, Nanine Means, MD  famotidine (PEPCID) 20 MG tablet Take 40 mg by mouth 2 (two) times daily.    [provider]  FLUoxetine (PROZAC) 10 MG capsule Take 5 capsules (50 mg total) by mouth daily. 08/01/18   Everrett Coombe, MD  folic acid (FOLVITE) 099 MCG tablet Take 400 mcg by mouth daily.     [provider]  furosemide (LASIX) 40 MG tablet Take 1 tablet (40 mg total) by mouth 2 (two) times daily. 08/19/16   Rai, Ripudeep K, MD  labetalol (NORMODYNE) 100 MG tablet Take 0.5 tablets (50 mg total) by mouth daily. 11/07/21   Sater, Nanine Means, MD  lidocaine-prilocaine (EMLA) cream Apply one inch before port-a cath access prn 03/23/21   Sater, Nanine Means, MD  mesalamine [facility])  500 MG CR capsule Take 1,000 mg by mouth 4 (four) times daily.    [provider]  mometasone (ELOCON) 0.1 % ointment Apply 1 application topically 2 (two) times daily.    [provider]  Multiple Vitamin (MULTIVITAMIN) tablet Take 1 tablet by mouth daily.    [provider]  OXcarbazepine (TRILEPTAL) 150 MG tablet Take 1 tablet (150 mg total) by mouth 3 (three) times daily. 11/22/21   Sater, Nanine Means, MD  potassium chloride (KLOR-CON) 10 MEQ tablet Take 10 mEq by mouth daily. 08/13/21   [provider]  QUEtiapine (SEROQUEL) 25 MG tablet Take 1 tablet (25 mg total) by mouth at bedtime. 07/31/18   Everrett Coombe, MD  sodium chloride (OCEAN) 0.65 % SOLN nasal spray Place 1 spray into both nostrils  as needed for congestion. 08/19/16   Rai, Vernelle Emerald, MD  tolterodine (DETROL) 2 MG tablet Take 2 mg by mouth 2 (two) times daily.  05/09/18   [provider]      Allergies    Nitrofurantoin, Oxycodone-acetaminophen, Risperidone and related, and Penicillins    Review of Systems   Review of Systems  Constitutional:  Negative for chills and fever.  Respiratory:  Negative for shortness of breath.   Cardiovascular:  Negative for chest pain.  Gastrointestinal:  Negative for abdominal pain.  Musculoskeletal:  Positive for arthralgias and myalgias. Negative for back pain and neck pain.  Neurological:  Negative for headaches.    Physical Exam Updated Vital Signs BP 124/60 (BP Location: Right Arm)   Pulse 72   Temp 98.1 F (36.7 C) (Oral)   Resp 16   Ht 5' 4"  (1.626 m)   Wt 79.4 kg   SpO2 100%   BMI 30.04 kg/m  Physical Exam Vitals and nursing note reviewed.  Constitutional:      General: She is not in acute distress.    Appearance: She is not ill-appearing or toxic-appearing.  HENT:     Head: Normocephalic and atraumatic.  Eyes:     General: No scleral icterus. Neck:     Comments: Some ROM present Cardiovascular:     Rate and Rhythm: Normal rate and regular rhythm.  Pulmonary:     Effort: Pulmonary effort is normal.     Breath sounds: Normal breath sounds.  Chest:     Chest wall: No tenderness.  Abdominal:     General: Bowel sounds are normal.     Palpations: Abdomen is soft.     Tenderness: There is no abdominal tenderness. There is no guarding or rebound.  Musculoskeletal:        General: Tenderness present. No deformity.     Cervical back: No tenderness.     Comments: Discoloration noted to the bilateral feet and bilateral hands.  Per husband and patient this appears to be around her baseline and happens whenever she does not elevate.  I do not see any other signs of trauma.  New scrapes, abrasions, or bruises are visible yet to the back or lower extremities.   Compartments are soft.  She reports that her sensation is still intact.  She has palpable upper and lower pulses that are symmetric.  She has pain with range of motion of the left shoulder and left elbow and left wrist.  No focal point tenderness to the left upper extremity.  For the right leg, she has pain more at the distal portion of her tibia-fibula/ankle, and pain in her knee.  More pain with range of  motion of her foot and with her knee.  The back, there is no midline or paraspinal tenderness palpation.  I do not see any signs of trauma.  No step-offs or deformities.  Muscle rigidity noted in the patient's upper and lower extremities, at baseline.   Skin:    General: Skin is warm and dry.  Neurological:     General: No focal deficit present.     Mental Status: She is alert. Mental status is at baseline.     ED Results / Procedures / Treatments   Labs (all labs ordered are listed, but only abnormal results are displayed) Labs Reviewed - No data to display  EKG None  Radiology No results found.  Procedures Procedures   Medications Ordered in ED Medications  acetaminophen (TYLENOL) tablet 1,000 mg (has no administration in time range)    ED Course/ Medical Decision Making/ A&P                           Medical Decision Making Amount and/or Complexity of Data Reviewed Radiology: ordered.  Risk OTC drugs.   56 year old female presents emerged from today for evaluation after a ground-level fall.  Differential diagnosis includes limited to fracture, dislocation, muscular injury, tendon injury, ligament injury, contusion.  Vital signs are unremarkable.  Physical exam as noted above.  The patient does not really have much point tenderness to palpation.  I do not see any obvious signs of trauma.  Because of her muscle rigidity, will obtain x-ray imaging.  She is requesting Tylenol.  1074m ordered.  XR imaging pending at this time.   3:29 PM Care of [facility]  transferred to PBoltat the end of my shift as the patient will require reassessment once labs/imaging have resulted. Patient presentation, ED course, and plan of care discussed with review of all pertinent labs and imaging. Please see his/her note for further details regarding further ED course and disposition. Plan at time of handoff is follow up on XR imaging. Will need a more in depth evaluation to deem if any other imaging is needed when she is not in a hallway. This may be altered or completely changed at the discretion of the oncoming team pending results of further workup.    Final Clinical Impression(s) / ED Diagnoses Final diagnoses:  Fall, initial encounter    Rx / DC Orders ED Discharge Orders     None         RSherrell Puller PHershal Coria12/18/23 1532    Long, JWonda Olds MD 08/06/22 [phone]

## 2022-07-31 NOTE — Progress Notes (Signed)
Orthopedic Tech Progress Note Patient Details:  Vanessa Short 1965/09/30 [id number]  RN was no help at all, asked if she could hold patient broken leg in certain places, acted as if I was bothering her, (with husband at bedside) so I had to get help from tech. Showed husband how to wrap patient leg and how to reapply knee immobilizer due to him having to put her into the wheelchair and get her home. Gave him extra padding to reapply as needed do to her condition   Ortho Devices Type of Ortho Device: Ace wrap, Cotton web roll, Sling immobilizer, Knee Immobilizer Ortho Device/Splint Location: LUE,RLE Ortho Device/Splint Interventions: Ordered, Application, Adjustment, Removal   Post Interventions Patient Tolerated: Fair Instructions Provided: Care of device  Janit Pagan 07/31/2022, 6:41 PM

## 2022-07-31 NOTE — Discharge Instructions (Signed)
As we discussed, your right lower leg is broken. We have placed you in a splint to ensure that it is immobilized. It is very important that you do not bear weight on this leg.  Additionally, you also have a fracture of your left upper arm.  For this we have placed you in a sling for immobilization.  I have also given you a referral to orthopedics with a number to call to schedule an appointment for continued evaluation and management of this injury.  I have also given you a prescription for tramadol which is a narcotic pain medication for you to take as prescribed as needed for severe pain only.  Return if development of any new or worsening symptoms.

## 2022-07-31 NOTE — ED Triage Notes (Signed)
Patient presents to ed via [place] she was at home, Patient states she has MS and has been non-ambulatory x 14 years, patient was on the commode and her aid was helping her transfer back to her wheelchair and she slid out of her wheelchair , hyperextended left arm above her head and twitsted right lower leg. Patient is alert oriented. Husband at bedside.

## 2022-08-15 ENCOUNTER — Encounter: Payer: Self-pay | Admitting: Neurology

## 2022-08-16 MED ORDER — CYCLOBENZAPRINE HCL 10 MG PO TABS: 10.0000 mg | ORAL_TABLET | Freq: Three times a day (TID) | ORAL | 1 refills | Status: DC | PRN

## 2022-08-16 NOTE — Telephone Encounter (Signed)
Can try adding cyclobenzaprine. Caution with current dantrolene and baclofen use. Also needs orthopedic follow up of fracture.  Meds ordered this encounter  Medications   cyclobenzaprine (FLEXERIL) 10 MG tablet    Sig: Take 1 tablet (10 mg total) by mouth 3 (three) times daily as needed for muscle spasms.    Dispense:  90 tablet    Refill:  [place], MD 01/18/3418, 3:79 PM Certified in Neurology, Neurophysiology and Neuroimaging  [facility] [address], [place], [place] [address] [phone]

## 2022-08-28 ENCOUNTER — Telehealth: Payer: Self-pay

## 2022-08-28 DIAGNOSIS — G35 Multiple sclerosis: Secondary | ICD-10-CM

## 2022-08-28 DIAGNOSIS — Z79899 Other long term (current) drug therapy: Secondary | ICD-10-CM

## 2022-08-28 DIAGNOSIS — Z7401 Bed confinement status: Secondary | ICD-10-CM

## 2022-08-28 DIAGNOSIS — R55 Syncope and collapse: Secondary | ICD-10-CM

## 2022-08-28 DIAGNOSIS — R262 Difficulty in walking, not elsewhere classified: Secondary | ICD-10-CM

## 2022-08-28 DIAGNOSIS — G8250 Quadriplegia, unspecified: Secondary | ICD-10-CM

## 2022-08-28 DIAGNOSIS — Z95828 Presence of other vascular implants and grafts: Secondary | ICD-10-CM

## 2022-08-28 NOTE — Telephone Encounter (Signed)
Dr. Felecia Shelling, if last flush was 07/03/22, wouldn't she needs to go to IR now to have it evaluated prior to attempting a flush again?

## 2022-08-28 NOTE — Telephone Encounter (Signed)
I spoke with Maudie Mercury with [facility]. She reports pt had a recent fall and she is unable to report to [facility] for port-a-cath flush for next 4-5 weeks. Maudie Mercury reports she has been flushing her port-a-cath every 4-6 weeks and last flush 07/03/22. She is over due.  Kim wanted to know if home health referral could be placed for this to be flushed? Unsure due to insurance if this will be approved. Will send to pod 1 for review and to discuss with Dr. Felecia Shelling.

## 2022-08-29 NOTE — Telephone Encounter (Signed)
Sent message to [facility] to see if they can do port flush. Waiting on response.

## 2022-08-30 NOTE — Addendum Note (Signed)
Addended by: Wyvonnia Lora on: 08/30/2022 12:57 PM   Modules accepted: Orders

## 2022-08-30 NOTE — Telephone Encounter (Signed)
Received message back from [facility] that they cannot do port flush/do not having nursing.

## 2022-08-30 NOTE — Telephone Encounter (Signed)
Placed referral to home health for nursing/PT to see if they can set up port flush for pt.

## 2022-08-31 NOTE — Telephone Encounter (Signed)
Pt is established with [facility], I informed them of new order for port flush to be added on to her services.

## 2022-09-05 ENCOUNTER — Ambulatory Visit: Payer: Medicare Other | Admitting: Neurology

## 2022-09-21 ENCOUNTER — Other Ambulatory Visit: Payer: Self-pay | Admitting: Family Medicine

## 2022-09-21 DIAGNOSIS — Z1231 Encounter for screening mammogram for malignant neoplasm of breast: Secondary | ICD-10-CM

## 2022-10-17 ENCOUNTER — Ambulatory Visit (INDEPENDENT_AMBULATORY_CARE_PROVIDER_SITE_OTHER): Payer: Managed Care, Other (non HMO) | Admitting: Neurology

## 2022-10-17 ENCOUNTER — Encounter: Payer: Self-pay | Admitting: Neurology

## 2022-10-17 VITALS — BP 141/75 | HR 86

## 2022-10-17 DIAGNOSIS — Z7401 Bed confinement status: Secondary | ICD-10-CM

## 2022-10-17 DIAGNOSIS — G8250 Quadriplegia, unspecified: Secondary | ICD-10-CM

## 2022-10-17 DIAGNOSIS — R440 Auditory hallucinations: Secondary | ICD-10-CM

## 2022-10-17 DIAGNOSIS — G35 Multiple sclerosis: Secondary | ICD-10-CM | POA: Diagnosis not present

## 2022-10-17 DIAGNOSIS — Z79899 Other long term (current) drug therapy: Secondary | ICD-10-CM | POA: Diagnosis not present

## 2022-10-17 DIAGNOSIS — Z95828 Presence of other vascular implants and grafts: Secondary | ICD-10-CM

## 2022-10-17 DIAGNOSIS — D72810 Lymphocytopenia: Secondary | ICD-10-CM

## 2022-10-17 MED ORDER — DIAZEPAM 5 MG PO TABS: ORAL_TABLET | ORAL | 5 refills | Status: DC

## 2022-10-17 NOTE — Progress Notes (Signed)
[facility]  [name] DOB: 06-23-1966  REFERRING CLINICIAN: Adah Salvage, [facility] HISTORY FROM: patient    REASON FOR VISIT: MS   HISTORICAL  CHIEF COMPLAINT:  Chief Complaint  Patient presents with   Room 10    Pt is here with husband. Pt's husband states she has an UTI. Pt's husband states that pt was dropped December 18th and had a broke leg and shoulder.     HISTORY OF PRESENT ILLNESS:  Vanessa Short is a 57 y.o. woman diagnosed with relapsing remitting MS in 1989.   She currently has a relapsing/active form of second progressive MS  Update 10/17/2022: She has been mostly bed bounds since she broke her right leg and left shoulder (an aide dropped her while transferring) 08/01/2023.  She still has a soft cast on the right leg.     She got the new power wheelchair but has not used it yet.     She is seeing orthopedics and sees them back next month.       She has had more cognitive issues and has had soe hallucinations recently.   She has had UTI and received 2 rounds of Abx.  She has incontinence.  Culture, ad 3 species so empiric therapy was prescribed.      She has a lot of spasticity in the legs.  Baclofen 20 mg po qid only helps some.  Additional cyclobenzaprine did not help   She has had long QT on some EKG    Current mobility impairment:  She has severe weakness due to her multiple sclerosis.  She has almost complete loss of strength in her legs as well as significant weakness in the left arm.  Due to the severe weakness, she cannot use a cane or walker.  She is unable to self propel in a manual wheelchair due to the weakness in the arms.  Currently she is in a power wheelchair with recline/tilt function and lift/elevation mode with power leg elevation.    Due to left > right arm weakness the joystick is on the right.  The chair also has back controls for a caregiver to help with her mobility.  Her current wheelchair  is showing signs of wear.  Activities of daily living: She has difficulty with multiple ADL's due to poor mobility.  Her ability to do these tasks is helped by having a power wheelchair with additional features (tilt/recline, lift, leg elevation).      Without a power wheelchair she is unable to go room to room for self care and simple chores.     With the chair, she can go from room to room within the house to compress her activities of daily living such as meal prep, bathroom and simple chores.  Need for additional features: She is in her chair all day and in a recliner to sleep at night .  She is unable to take any steps but can transfer with assistance using a sit to stand apparatus.  Because she spends the entire day in the wheelchair, it is necessary for her to have tilt/recline function and leg elevation features.  As she sometimes needs to take naps she needs to be able to recline flat.  This also is beneficial when she goes to doctor appointments to assist with the examination.  She needs a lift function to do chores at different heights.  MS: She was diagnosed with MS 1989.  Unfortunately, she has had progressive difficulties  with weakness in both legs and in the left arm more than the right arm..  She has not had any recent exacerbation.  Due to her low lymphocyte count her Vumerity is currently being held.  Currently, strength is 0-1/5 in the legs and 2/5 in the left arm and 2+ to 4 -/5 in the right arm. She has urinary incontinence but has some voluntary control at times.   Vision is symmetric..  For spasticity has been a problem and she is on Dantrium 50 mg 3 times a day and baclofen 20 mg 4 times a day.   She notes some fatigue.  She denies depression at this time.  Cognition is usually fine but has had issues when she has an infection.    She takes fluoxetine 60 mg daily.      MS History:  She presented with a Lhermite's syndrome in 1988 or 1989.   MRIs of the head and neck were  performed. They showed lesions consistent with the diagnosis of multiple sclerosis. Lumbar puncture was not necessary. Over the next 15 years, she would get some exacerbations and have courses of steroids. However, a disease modifying therapy was not prescribed. By 2004, she had difficulty with her gait and would often have to lean on somebody for support.  She started Copaxone that year and stayed on it for about one half years. She stopped due to a lot of itching. She then switched to Rebif. While on Copaxone Rebif she continues to have exacerbations and further difficulties with her gait. About 2012 she switched to Aubagio. She tolerated Aubagio well but progressed.  Tysabri was started in February 2019 after relapse.  Unfortunately she converted from JCV negative to positive.  She felt she did well on Tysabri with some slowing of her progression as well as improved fatigue.  Due to the elevated JCV, she switched to Vumerity in 2021.  MRI 10/11/2016 showed:  Confluent white matter disease throughout the cerebral hemispheres consistent with chronic multiple sclerosis. Several foci show restricted diffusion, but no contrast enhancement.  No new lesions compared to January 2018.  MRI cervical spine 08/16/2016 showed:  C5 myelomalacia, additional spinal cord plaques consistent with chronic demyelination, relatively similar though limited assessment due to patient motion.  MRI brain 08/16/2016 showed:  Numerous foci of reduced diffusion most consistent with hyperacute/active demyelination, less likely infection/septic emboli. Severe chronic demyelination including supra and infratentorial white matter, cortex and deep gray nuclei lesions.  Moderate parenchymal brain volume loss for age.  REVIEW OF SYSTEMS:  Constitutional: No fevers, chills, sweats, or change in appetite.  Has fatigue and poor slep Eyes: see above.   No eye pain Ear, nose and throat: No hearing loss, ear pain, nasal congestion, sore  throat Cardiovascular: No chest pain, palpitations Respiratory:  No shortness of breath at rest or with exertion.   No wheezes GastrointestinaI: Has UC - doing well.   No nausea, vomiting, diarrhea, abdominal pain, fecal incontinence Genitourinary:  see above Musculoskeletal:  No neck pain, back pain Integumentary: No rash, pruritus, skin lesions Neurological: as above Psychiatric: as above. Endocrine: No palpitations, diaphoresis, change in appetite, change in weigh or increased thirst Hematologic/Lymphatic:  No anemia, purpura, petechiae. Allergic/Immunologic: No itchy/runny eyes, nasal congestion, recent allergic reactions, rashes  ALLERGIES: Allergies  Allergen Reactions   Nitrofurantoin Other (See Comments)    syncope   Oxycodone-Acetaminophen Other (See Comments)    Other   Risperidone And Related     Became very lethargic  Penicillins Rash    Has patient had a PCN reaction causing immediate rash, facial/tongue/throat swelling, SOB or lightheadedness with hypotension: NO Has patient had a PCN reaction causing severe rash involving mucus membranes or skin necrosis: NO Has patient had a PCN reaction that required hospitalization NO Has patient had a PCN reaction occurring within the last 10 years:NO If all of the above answers are "NO", then may proceed with Cephalosporin use.    HOME MEDICATIONS: Outpatient Medications Prior to Visit  Medication Sig Dispense Refill   acetaminophen (TYLENOL) 500 MG tablet Take 1,000 mg by mouth at bedtime.     acyclovir (ZOVIRAX) 400 MG tablet Take 400 mg by mouth daily. Take every day per patient  0   AMITIZA 8 MCG capsule Take 8 mcg by mouth 3 (three) times daily. GI MD prescribing- will take 2 caps BID prn  1   aspirin EC 81 MG tablet Take 81 mg by mouth daily.     b complex vitamins tablet Take 1 tablet by mouth daily.      baclofen (LIORESAL) 20 MG tablet One po qid 360 tablet 3   cholecalciferol (VITAMIN D) 1000 UNITS tablet Take  1,000 Units by mouth daily.      ciprofloxacin (CIPRO) 250 MG tablet Take 250 mg by mouth 2 (two) times daily.     dantrolene (DANTRIUM) 50 MG capsule Take 1 capsule (50 mg total) by mouth in the morning, at noon, in the evening, and at bedtime. 360 capsule 3   famotidine (PEPCID) 20 MG tablet Take 40 mg by mouth 2 (two) times daily.     FLUoxetine (PROZAC) 10 MG capsule Take 5 capsules (50 mg total) by mouth daily. [id number] capsule 0   folic acid (FOLVITE) A999333 MCG tablet Take 800 mcg by mouth daily.     furosemide (LASIX) 40 MG tablet Take 1 tablet (40 mg total) by mouth 2 (two) times daily. 30 tablet    labetalol (NORMODYNE) 100 MG tablet Take 0.5 tablets (50 mg total) by mouth daily. 45 tablet 3   lidocaine-prilocaine (EMLA) cream Apply one inch before port-a cath access prn 30 g 3   Multiple Vitamin (MULTIVITAMIN) tablet Take 1 tablet by mouth daily.     OXcarbazepine (TRILEPTAL) 150 MG tablet Take 1 tablet (150 mg total) by mouth 3 (three) times daily. 270 tablet 3   potassium chloride (KLOR-CON) 10 MEQ tablet Take 10 mEq by mouth daily.     QUEtiapine (SEROQUEL) 25 MG tablet Take 1 tablet (25 mg total) by mouth at bedtime. 30 tablet 0   rosuvastatin (CRESTOR) 40 MG tablet Take 40 mg by mouth daily.     sodium chloride (OCEAN) 0.65 % SOLN nasal spray Place 1 spray into both nostrils as needed for congestion.  0   tolterodine (DETROL) 2 MG tablet Take 2 mg by mouth 2 (two) times daily.   1   cyclobenzaprine (FLEXERIL) 10 MG tablet Take 1 tablet (10 mg total) by mouth 3 (three) times daily as needed for muscle spasms. (Patient not taking: Reported on 10/17/2022) 90 tablet 1   mesalamine (PENTASA) 500 MG CR capsule Take 1,000 mg by mouth 4 (four) times daily. (Patient not taking: Reported on 10/17/2022)     mometasone (ELOCON) 0.1 % ointment Apply 1 application topically 2 (two) times daily. (Patient not taking: Reported on 10/17/2022)     traMADol (ULTRAM) 50 MG tablet Take 1 tablet (50 mg total) by mouth  every 6 (six) hours as needed. (  Patient not taking: Reported on 10/17/2022) 15 tablet 0   No facility-administered medications prior to visit.    PAST MEDICAL HISTORY: Past Medical History:  Diagnosis Date   Headache    History of kidney stones    Hypertension    Kidney stones    Movement disorder    Multiple sclerosis ([place])    Neuropathy    Ulcerative colitis ([place])    Vision abnormalities     PAST SURGICAL HISTORY: Past Surgical History:  Procedure Laterality Date   ENDOMETRIAL ABLATION  10/10/2017   KIDNEY STONE SURGERY     PORTACATH PLACEMENT N/A 10/16/2017   Procedure: ULTRA SOUND GUIDED INSERTION PORT-A-CATH ERAS PATHWAY;  Surgeon: Mickeal Skinner, MD;  Location: [facility];  Service: General;  Laterality: N/A;    FAMILY HISTORY: Family History  Problem Relation Age of Onset   Dementia Mother    Hypertension Father    Hyperlipidemia Father    Diabetes Father    Heart disease Father    Breast cancer Neg Hx     SOCIAL HISTORY:  Social History   Socioeconomic History   Marital status: Married    Spouse name: Not on file   Number of children: Not on file   Years of education: Not on file   Highest education level: Not on file  Occupational History   Not on file  Tobacco Use   Smoking status: Never   Smokeless tobacco: Never  Vaping Use   Vaping Use: Never used  Substance and Sexual Activity   Alcohol use: Yes    Comment: occasional glass of wine   Drug use: No   Sexual activity: Not on file  Other Topics Concern   Not on file  Social History Narrative   ** Merged History Encounter **       Social Determinants of Health   Financial Resource Strain: Not on file  Food Insecurity: Not on file  Transportation Needs: Not on file  Physical Activity: Not on file  Stress: Not on file  Social Connections: Not on file  Intimate Partner Violence: Not on file     PHYSICAL EXAM  Vitals:   10/17/22 1304  BP: (!) 141/75  Pulse: 86   Weight = 175  pounds.   Patient is right handed.     There is no height or weight on file to calculate BMI.   General: The patient is well-developed and well-nourished and in no acute distress.  She is in a regular wheelchair with outstretched right leg that is splinted Skin/extremities: Extremities are without rash.  She has edema at the ankles  Neurologic Exam  Mental status: The patient is alert and oriented.  Focus and attention was reduced.  Speech is normal.  Cranial nerves: Extraocular movements are full.   Facial strength and sensation is normal.  Trapezius strength is normal.  There is no dysarthria.  No obvious hearing deficits are noted.  Motor:  Muscle bulk is normal .  Muscle tone is mildly increased in the legs, right greater than left.  She has increased muscle tone in the arms, left >>right   She has 0/5 strength in the legs.. Strength is 1 to 2-/5 in the left arm and 2+ to 3/5 in the right arm proximal arm and 4/5 finger and wrist flexion.  She is unable to write.  She is able to operate her joystick on the right..   Sensory: She appears to have symmetric vibration sensation in the arms and  reduced vibration sensation at the knees.  Gait and station: She can not stand or walk.    Reflexes: She has increased reflexes in her legs.    DIAGNOSTIC DATA (LABS, IMAGING, TESTING) - I reviewed patient records, labs, notes, testing and imaging myself where available.  Lab Results  Component Value Date   WBC 7.8 03/22/2022   HGB 13.7 03/22/2022   HCT 40.0 03/22/2022   MCV 95 03/22/2022   PLT 114 (L) 03/22/2022      Component Value Date/Time   NA 142 03/22/2022 1435   K 3.6 03/22/2022 1435   CL 104 03/22/2022 1435   CO2 21 03/22/2022 1435   GLUCOSE 182 (H) 03/22/2022 1435   GLUCOSE 94 07/30/2018 0550   BUN 21 03/22/2022 1435   CREATININE 0.95 03/22/2022 1435   CALCIUM 9.0 03/22/2022 1435   PROT 6.1 03/22/2022 1435   ALBUMIN 4.3 03/22/2022 1435   AST 16 03/22/2022 1435   ALT 22  03/22/2022 1435   ALKPHOS 98 03/22/2022 1435   BILITOT 0.3 03/22/2022 1435   GFRNONAA 69 03/16/2020 1502   GFRAA 79 03/16/2020 1502       ASSESSMENT AND PLAN  Multiple sclerosis (HCC) - Plan: CBC with Differential/Platelet, Comprehensive metabolic panel  High risk medication use - Plan: CBC with Differential/Platelet, Comprehensive metabolic panel  Bedbound  Port-A-Cath in place  Spastic quadriparesis (HCC)  Lymphopenia  Auditory hallucinations   Unfortunately, she had a fall while being transferred and suffered a right leg and left shoulder fractures.  Her leg is still supposed to be an outstretched position.  She has not used her new wheelchair yet.  The splint may come off in another 6 weeks.. Her lymphocyte count has remained low so I have kept her off of a disease modifying therapy.   The patient comes back to have her port flush later this week and we will check the CBC with differential and determine if we can restart a disease modifying therapy.   Port will be flushed about every 4-6 weeks.  4.   Oxcarbazepine for dysesthesias.  We will also check a CMP when she comes in 5.   rtc 6 months, sooner if ne or worsening neurologic issues  40-minute office visit with the majority of the time spent face-to-face for history and physical, discussion/counseling and decision-making.  Additional time with record review and documentation.   Myda Detwiler A. Felecia Shelling, MD, PhD [id number], [id number] PM Certified in Neurology, Clinical Neurophysiology, Sleep Medicine, Pain Medicine and Neuroimaging  [facility] [address], [place], [place] [address] [phone]  Addendum 09/08/20:  Fixed error in history - correct statement should be she switched from Tysabri to [place]. -- RAS

## 2022-10-18 ENCOUNTER — Other Ambulatory Visit: Payer: Self-pay

## 2022-10-18 DIAGNOSIS — Z79899 Other long term (current) drug therapy: Secondary | ICD-10-CM

## 2022-10-18 DIAGNOSIS — G35 Multiple sclerosis: Secondary | ICD-10-CM

## 2022-10-19 LAB — CBC WITH DIFFERENTIAL/PLATELET
Basophils Absolute: 0.1 10*3/uL (ref 0.0–0.2)
Basos: 1 %
EOS (ABSOLUTE): 0.3 10*3/uL (ref 0.0–0.4)
Eos: 2 %
Hematocrit: 34.5 % (ref 34.0–46.6)
Hemoglobin: 11.5 g/dL (ref 11.1–15.9)
Immature Grans (Abs): 0 10*3/uL (ref 0.0–0.1)
Immature Granulocytes: 0 %
Lymphocytes Absolute: 0.9 10*3/uL (ref 0.7–3.1)
Lymphs: 8 %
MCH: 31.2 pg (ref 26.6–33.0)
MCHC: 33.3 g/dL (ref 31.5–35.7)
MCV: 94 fL (ref 79–97)
Monocytes Absolute: 1.2 10*3/uL — ABNORMAL HIGH (ref 0.1–0.9)
Monocytes: 11 %
Neutrophils Absolute: 9.3 10*3/uL — ABNORMAL HIGH (ref 1.4–7.0)
Neutrophils: 78 %
Platelets: 173 10*3/uL (ref 150–450)
RBC: 3.69 x10E6/uL — ABNORMAL LOW (ref 3.77–5.28)
RDW: 13.4 % (ref 11.7–15.4)
WBC: 11.8 10*3/uL — ABNORMAL HIGH (ref 3.4–10.8)

## 2022-10-19 LAB — COMPREHENSIVE METABOLIC PANEL
ALT: 37 IU/L — ABNORMAL HIGH (ref 0–32)
AST: 23 IU/L (ref 0–40)
Albumin/Globulin Ratio: 2 (ref 1.2–2.2)
Albumin: 4.1 g/dL (ref 3.8–4.9)
Alkaline Phosphatase: 130 IU/L — ABNORMAL HIGH (ref 44–121)
BUN/Creatinine Ratio: 20 (ref 9–23)
BUN: 18 mg/dL (ref 6–24)
Bilirubin Total: 0.4 mg/dL (ref 0.0–1.2)
CO2: 21 mmol/L (ref 20–29)
Calcium: 9.4 mg/dL (ref 8.7–10.2)
Chloride: 102 mmol/L (ref 96–106)
Creatinine, Ser: 0.92 mg/dL (ref 0.57–1.00)
Globulin, Total: 2.1 g/dL (ref 1.5–4.5)
Glucose: 209 mg/dL — ABNORMAL HIGH (ref 70–99)
Potassium: 3.1 mmol/L — ABNORMAL LOW (ref 3.5–5.2)
Sodium: 145 mmol/L — ABNORMAL HIGH (ref 134–144)
Total Protein: 6.2 g/dL (ref 6.0–8.5)
eGFR: 73 mL/min/{1.73_m2} (ref >59)

## 2022-10-24 ENCOUNTER — Telehealth: Payer: Self-pay | Admitting: Neurology

## 2022-10-24 NOTE — Telephone Encounter (Signed)
Pt called wanting to know if she can go between the diazepam (VALIUM) 5 MG tablet  and the Flexeril or should she just stay with the Valium. Please advise.

## 2022-10-24 NOTE — Telephone Encounter (Signed)
Please call pt back and relay Dr. Garth Bigness message, thank you

## 2022-11-07 ENCOUNTER — Ambulatory Visit: Payer: Medicare Other

## 2022-11-15 ENCOUNTER — Other Ambulatory Visit: Payer: Self-pay | Admitting: Neurology

## 2022-11-29 ENCOUNTER — Other Ambulatory Visit: Payer: Self-pay

## 2022-11-29 ENCOUNTER — Other Ambulatory Visit: Payer: Self-pay | Admitting: *Deleted

## 2022-11-29 DIAGNOSIS — G35 Multiple sclerosis: Secondary | ICD-10-CM

## 2022-11-29 DIAGNOSIS — Z79899 Other long term (current) drug therapy: Secondary | ICD-10-CM

## 2022-11-30 LAB — CBC WITH DIFFERENTIAL/PLATELET
Basophils Absolute: 0.1 10*3/uL (ref 0.0–0.2)
Basos: 1 %
EOS (ABSOLUTE): 0.4 10*3/uL (ref 0.0–0.4)
Eos: 4 %
Hematocrit: 37.1 % (ref 34.0–46.6)
Hemoglobin: 11.8 g/dL (ref 11.1–15.9)
Immature Grans (Abs): 0 10*3/uL (ref 0.0–0.1)
Immature Granulocytes: 0 %
Lymphocytes Absolute: 1.3 10*3/uL (ref 0.7–3.1)
Lymphs: 13 %
MCH: 30.2 pg (ref 26.6–33.0)
MCHC: 31.8 g/dL (ref 31.5–35.7)
MCV: 95 fL (ref 79–97)
Monocytes Absolute: 0.8 10*3/uL (ref 0.1–0.9)
Monocytes: 8 %
Neutrophils Absolute: 7.1 10*3/uL — ABNORMAL HIGH (ref 1.4–7.0)
Neutrophils: 74 %
Platelets: 159 10*3/uL (ref 150–450)
RBC: 3.91 x10E6/uL (ref 3.77–5.28)
RDW: 14.9 % (ref 11.7–15.4)
WBC: 9.6 10*3/uL (ref 3.4–10.8)

## 2022-11-30 LAB — COMPREHENSIVE METABOLIC PANEL
ALT: 34 IU/L — ABNORMAL HIGH (ref 0–32)
AST: 26 IU/L (ref 0–40)
Albumin/Globulin Ratio: 1.8 (ref 1.2–2.2)
Albumin: 4 g/dL (ref 3.8–4.9)
Alkaline Phosphatase: 133 IU/L — ABNORMAL HIGH (ref 44–121)
BUN/Creatinine Ratio: 27 — ABNORMAL HIGH (ref 9–23)
BUN: 27 mg/dL — ABNORMAL HIGH (ref 6–24)
Bilirubin Total: 0.3 mg/dL (ref 0.0–1.2)
CO2: 24 mmol/L (ref 20–29)
Calcium: 9.6 mg/dL (ref 8.7–10.2)
Chloride: 104 mmol/L (ref 96–106)
Creatinine, Ser: 1 mg/dL (ref 0.57–1.00)
Globulin, Total: 2.2 g/dL (ref 1.5–4.5)
Glucose: 221 mg/dL — ABNORMAL HIGH (ref 70–99)
Potassium: 3.1 mmol/L — ABNORMAL LOW (ref 3.5–5.2)
Sodium: 147 mmol/L — ABNORMAL HIGH (ref 134–144)
Total Protein: 6.2 g/dL (ref 6.0–8.5)
eGFR: 65 mL/min/{1.73_m2} (ref >59)

## 2022-11-30 LAB — HEMOGLOBIN A1C
Est. average glucose Bld gHb Est-mCnc: 111 mg/dL
Hgb A1c MFr Bld: 5.5 % (ref 4.8–5.6)

## 2022-12-04 ENCOUNTER — Telehealth: Payer: Self-pay | Admitting: Neurology

## 2022-12-04 MED ORDER — OXCARBAZEPINE 150 MG PO TABS: 150.0000 mg | ORAL_TABLET | Freq: Three times a day (TID) | ORAL | 3 refills | Status: DC

## 2022-12-04 MED ORDER — DANTROLENE SODIUM 50 MG PO CAPS: 50.0000 mg | ORAL_CAPSULE | Freq: Four times a day (QID) | ORAL | 3 refills | Status: DC

## 2022-12-04 NOTE — Telephone Encounter (Signed)
Pt husband calling requesting refill on dantrolene (DANTRIUM) 50 MG capsule andOXcarbazepine (TRILEPTAL) 150 MG tablet. Should be sent to [id number]. He is requesting a 90 day supply on refill.

## 2022-12-04 NOTE — Telephone Encounter (Signed)
E-scribed refills to [facility] as requested.

## 2022-12-05 ENCOUNTER — Telehealth: Payer: Self-pay | Admitting: *Deleted

## 2022-12-05 DIAGNOSIS — Z79899 Other long term (current) drug therapy: Secondary | ICD-10-CM

## 2022-12-05 DIAGNOSIS — G35 Multiple sclerosis: Secondary | ICD-10-CM

## 2022-12-05 NOTE — Telephone Encounter (Signed)
Called and spoke w/ pt/family about results per Dr. Bonnita Hollow note. Pt verbalized understanding. Pt asked about medication she has left at home: Vumerity (expired) and has Aubagio left over (brand) but expired. Advised she should not take expired medication.   She is ok to proceed with teriflunomide  po qd. Explained that rx will be sent to [facility]. Pharm and PA may be needed. Once approved, they will coordinate shipment with them. Asked they call once they receive because we will need to schedule once a month LFT's lab checks for a total of 6 months starting a month after she has been on med.   Husband asked me to look back in chart to see why she went off Aubagio in the past. I reviewed chart and relayed per previous notes from Dr. Epimenio Foot: "About 2012 she switched to [facility]. She tolerated Aubagio well but progressed"  He would like to know if Dr. Epimenio Foot feels it would be ok for her to go back on generic teriflunomide d/t her hx mentioned above?   Aware I will send to MD and call back with his response.

## 2022-12-05 NOTE — Telephone Encounter (Signed)
-----   Message from Arther Abbott, RN sent at 12/05/2022  7:11 AM EDT -----  ----- Message ----- From: Asa Lente, MD Sent: 12/04/2022   8:40 PM EDT To: Arther Abbott, RN  Please let her know that the white blood cell count (lymphocytes) has finally returned to normal.  We can get her started on teriflunomide 14 mg

## 2022-12-06 MED ORDER — TERIFLUNOMIDE 14 MG PO TABS
1.0000 | ORAL_TABLET | Freq: Every day | ORAL | 3 refills | Status: DC
Start: 2022-12-06 — End: 2023-02-03

## 2022-12-06 NOTE — Telephone Encounter (Signed)
Called and spoke w/ Kelly/Jahanna on speaker. Relayed Dr. Bonnita Hollow message. They verbalized understanding. They asked that rx teriflunomide  po qd be sent to [facility] instead for 90/90 d/t it being more cost effective. I e-scribed rx. They will call back once they receive medication to schedule monthly lab draws.

## 2022-12-12 ENCOUNTER — Encounter: Payer: Self-pay | Admitting: Neurology

## 2022-12-13 ENCOUNTER — Other Ambulatory Visit: Payer: Self-pay | Admitting: *Deleted

## 2022-12-13 DIAGNOSIS — G35 Multiple sclerosis: Secondary | ICD-10-CM

## 2022-12-13 MED ORDER — TERIFLUNOMIDE 14 MG PO TABS
1.0000 | ORAL_TABLET | Freq: Every day | ORAL | 0 refills | Status: DC
Start: 2022-12-13 — End: 2023-01-26

## 2022-12-20 ENCOUNTER — Ambulatory Visit: Payer: Medicare Other

## 2022-12-20 NOTE — Addendum Note (Signed)
Addended by: Geronimo Running A on: 12/20/2022 10:23 AM   Modules accepted: Orders

## 2022-12-20 NOTE — Telephone Encounter (Signed)
LFT order placed. Patient coming for a port flush on 5/29 and will have LFTs drawn then.

## 2023-01-01 ENCOUNTER — Encounter: Payer: Self-pay | Admitting: Neurology

## 2023-01-01 NOTE — Telephone Encounter (Signed)
Gave completed/signed form to Holly/intrafusion to give pt at port flush appt.

## 2023-01-03 ENCOUNTER — Other Ambulatory Visit: Payer: Self-pay | Admitting: *Deleted

## 2023-01-03 DIAGNOSIS — Z79899 Other long term (current) drug therapy: Secondary | ICD-10-CM

## 2023-01-03 DIAGNOSIS — G35 Multiple sclerosis: Secondary | ICD-10-CM

## 2023-01-07 ENCOUNTER — Other Ambulatory Visit: Payer: Self-pay | Admitting: Neurology

## 2023-01-09 ENCOUNTER — Encounter: Payer: Self-pay | Admitting: Neurology

## 2023-01-09 MED ORDER — BACLOFEN 20 MG PO TABS: ORAL_TABLET | ORAL | 1 refills | Status: DC

## 2023-01-09 NOTE — Telephone Encounter (Signed)
Per 10/17/22 note "Baclofen 20 mg po qid only helps some " Follow up scheduled on 04/26/23 Last filled on 09/18/22 #360 tablets (90 day supply)

## 2023-01-10 ENCOUNTER — Other Ambulatory Visit: Payer: Self-pay

## 2023-01-10 DIAGNOSIS — G35 Multiple sclerosis: Secondary | ICD-10-CM

## 2023-01-10 DIAGNOSIS — Z79899 Other long term (current) drug therapy: Secondary | ICD-10-CM

## 2023-01-10 NOTE — Telephone Encounter (Signed)
Pt last seen on 10/17/22  Per note on 11/07/21 "She will cut labetalol in 1/2 to 50 mg daily due to episodes of presyncope and several episodes of syncope. Follow up scheduled on 04/26/23

## 2023-01-11 LAB — COMPREHENSIVE METABOLIC PANEL
ALT: 33 IU/L — ABNORMAL HIGH (ref 0–32)
AST: 22 IU/L (ref 0–40)
Albumin/Globulin Ratio: 1.7 (ref 1.2–2.2)
Albumin: 4 g/dL (ref 3.8–4.9)
Alkaline Phosphatase: 135 IU/L — ABNORMAL HIGH (ref 44–121)
BUN/Creatinine Ratio: 22 (ref 9–23)
BUN: 24 mg/dL (ref 6–24)
Bilirubin Total: 0.2 mg/dL (ref 0.0–1.2)
CO2: 24 mmol/L (ref 20–29)
Calcium: 9.2 mg/dL (ref 8.7–10.2)
Chloride: 106 mmol/L (ref 96–106)
Creatinine, Ser: 1.08 mg/dL — ABNORMAL HIGH (ref 0.57–1.00)
Globulin, Total: 2.3 g/dL (ref 1.5–4.5)
Glucose: 136 mg/dL — ABNORMAL HIGH (ref 70–99)
Potassium: 3.2 mmol/L — ABNORMAL LOW (ref 3.5–5.2)
Sodium: 147 mmol/L — ABNORMAL HIGH (ref 134–144)
Total Protein: 6.3 g/dL (ref 6.0–8.5)
eGFR: 60 mL/min/{1.73_m2} (ref >59)

## 2023-01-11 LAB — LIPID PANEL
Chol/HDL Ratio: 2.4 ratio (ref 0.0–4.4)
Cholesterol, Total: 120 mg/dL (ref 100–199)
HDL: 51 mg/dL (ref >39)
LDL Chol Calc (NIH): 48 mg/dL (ref 0–99)
Triglycerides: 117 mg/dL (ref 0–149)
VLDL Cholesterol Cal: 21 mg/dL (ref 5–40)

## 2023-01-11 LAB — HEPATIC FUNCTION PANEL: Bilirubin, Direct: 0.11 mg/dL (ref 0.00–0.40)

## 2023-01-18 ENCOUNTER — Ambulatory Visit: Payer: Medicare Other

## 2023-01-20 ENCOUNTER — Encounter (HOSPITAL_COMMUNITY): Payer: Self-pay

## 2023-01-20 ENCOUNTER — Other Ambulatory Visit: Payer: Self-pay

## 2023-01-20 ENCOUNTER — Emergency Department (HOSPITAL_COMMUNITY): Payer: Medicare Other

## 2023-01-20 ENCOUNTER — Emergency Department (HOSPITAL_COMMUNITY)
Admission: EM | Admit: 2023-01-20 | Discharge: 2023-01-20 | Disposition: A | Discharge status: Home / Self Care | Payer: Medicare Other | Attending: Emergency Medicine | Admitting: Emergency Medicine

## 2023-01-20 DIAGNOSIS — J340 Abscess, furuncle and carbuncle of nose: Secondary | ICD-10-CM | POA: Insufficient documentation

## 2023-01-20 DIAGNOSIS — L0291 Cutaneous abscess, unspecified: Secondary | ICD-10-CM

## 2023-01-20 LAB — BASIC METABOLIC PANEL
Anion gap: 14 (ref 5–15)
BUN: 19 mg/dL (ref 6–20)
CO2: 22 mmol/L (ref 22–32)
Calcium: 9 mg/dL (ref 8.9–10.3)
Chloride: 102 mmol/L (ref 98–111)
Creatinine, Ser: 1.09 mg/dL — ABNORMAL HIGH (ref 0.44–1.00)
GFR, Estimated: 59 mL/min — ABNORMAL LOW (ref >60)
Glucose, Bld: 120 mg/dL — ABNORMAL HIGH (ref 70–99)
Potassium: 3.4 mmol/L — ABNORMAL LOW (ref 3.5–5.1)
Sodium: 138 mmol/L (ref 135–145)

## 2023-01-20 LAB — AEROBIC CULTURE W GRAM STAIN (SUPERFICIAL SPECIMEN)

## 2023-01-20 LAB — CBC
HCT: 36.8 % (ref 36.0–46.0)
Hemoglobin: 11.8 g/dL — ABNORMAL LOW (ref 12.0–15.0)
MCH: 30.8 pg (ref 26.0–34.0)
MCHC: 32.1 g/dL (ref 30.0–36.0)
MCV: 96.1 fL (ref 80.0–100.0)
Platelets: 187 10*3/uL (ref 150–400)
RBC: 3.83 MIL/uL — ABNORMAL LOW (ref 3.87–5.11)
RDW: 15.1 % (ref 11.5–15.5)
WBC: 12.7 10*3/uL — ABNORMAL HIGH (ref 4.0–10.5)
nRBC: 0 % (ref 0.0–0.2)

## 2023-01-20 LAB — LACTIC ACID, PLASMA: Lactic Acid, Venous: 1.3 mmol/L (ref 0.5–1.9)

## 2023-01-20 MED ORDER — DOXYCYCLINE HYCLATE 100 MG PO TABS
100.0000 mg | ORAL_TABLET | Freq: Once | ORAL | Status: AC
  Administered 2023-01-20: 100 mg via ORAL
  Filled 2023-01-20: qty 1

## 2023-01-20 MED ORDER — CEFDINIR 300 MG PO CAPS
300.0000 mg | ORAL_CAPSULE | Freq: Two times a day (BID) | ORAL | Status: DC
  Administered 2023-01-20: 300 mg via ORAL
  Filled 2023-01-20: qty 1

## 2023-01-20 MED ORDER — ACETAMINOPHEN 325 MG PO TABS
650.0000 mg | ORAL_TABLET | Freq: Once | ORAL | Status: AC
  Administered 2023-01-20: 650 mg via ORAL
  Filled 2023-01-20: qty 2

## 2023-01-20 MED ORDER — IOHEXOL 350 MG/ML SOLN
75.0000 mL | Freq: Once | INTRAVENOUS | Status: AC | PRN
  Administered 2023-01-20: 75 mL via INTRAVENOUS

## 2023-01-20 MED ORDER — METRONIDAZOLE 500 MG PO TABS
500.0000 mg | ORAL_TABLET | Freq: Once | ORAL | Status: AC
  Administered 2023-01-20: 500 mg via ORAL
  Filled 2023-01-20: qty 1

## 2023-01-20 NOTE — ED Provider Notes (Signed)
  Accepted handoff at shift change from [facility]. Please see prior provider note for more detail.   Briefly: Patient is 57 y.o. "who presents to the ED secondary to a abscess on her right nostril, has been going on for the last week. She states that it started as a pimple, and is progressively gotten larger, she called her primary care doctor, who recommended that she put mupirocin on it, but she states has not been doing any good. It sounds a large, today it popped, and her nose has been filled with pus."     Plan: Was able to manually express purulent material from wound for comfort and for wound sample. Discussed with pharmacy, given penicillin allergy, will start on flagyl, doxy, and cefdinir for coverage.    Upon talking with patient before discharge, abscess was open and draining. Cultures pending. CT head without concern for severe deep space infection. Patient afebrile with stable vitals and states that she would like to go home. Patient is being discharged with ABX and provided with return precautions.          Dorthy Cooler, [place] 01/20/23 2228    Gerhard Munch, MD 01/20/23 2350

## 2023-01-20 NOTE — ED Triage Notes (Addendum)
Pt c/o nasal abscess in R nostril that ruptured yesterday and continues to ooze; noted 1 week ago; pt started on mupirocin topical ointment yesterday; endorses chills and nausea

## 2023-01-20 NOTE — ED Provider Notes (Signed)
[place] EMERGENCY DEPARTMENT AT [facility] Provider Note   CSN: [id number] Arrival date & time: 01/20/23  1555     History  Chief Complaint  Patient presents with   Abscess    Vanessa Short is a 57 y.o. female, history of multiple sclerosis, ulcerative colitis, who presents to the ED secondary to a abscess on her right nostril, has been going on for the last week.  She states that it started as a pimple, and is progressively gotten larger, she called her primary care doctor, who recommended that she put mupirocin on it, but she states has not been doing any good.  It sounds a large, today it popped, and her nose has been filled with pus.  She went to go see urgent care and was instructed to come to the ER for further evaluation.  She denies any fevers, chills.  She did states she actually scratched it, last week, and that is when this all started.  Is not on any kind of immunosuppressants, or Biologics.Denies any fevers or chills, however does state that is quite tender     Home Medications Prior to Admission medications   Medication Sig Start Date End Date Taking? Authorizing Provider  acetaminophen (TYLENOL) 500 MG tablet Take 1,000 mg by mouth at bedtime.    [provider]  acyclovir (ZOVIRAX) 400 MG tablet Take 400 mg by mouth daily. Take every day per patient 08/11/14   [provider]  AMITIZA 8 MCG capsule Take 8 mcg by mouth 3 (three) times daily. GI MD prescribing- will take 2 caps BID prn 06/11/18   [provider]  aspirin EC 81 MG tablet Take 81 mg by mouth daily.    [provider]  b complex vitamins tablet Take 1 tablet by mouth daily.     [provider]  baclofen (LIORESAL) 20 MG tablet One po qid 01/09/23   Sater, Pearletha Furl, MD  cholecalciferol (VITAMIN D) 1000 UNITS tablet Take 1,000 Units by mouth daily.     [provider]  ciprofloxacin (CIPRO) 250 MG tablet Take 250 mg by mouth 2 (two) times  daily.    [provider]  dantrolene (DANTRIUM) 50 MG capsule Take 1 capsule (50 mg total) by mouth in the morning, at noon, in the evening, and at bedtime. 12/04/22   Sater, Pearletha Furl, MD  diazepam (VALIUM) 5 MG tablet One po qHS 10/17/22   Sater, Pearletha Furl, MD  famotidine (PEPCID) 20 MG tablet Take 40 mg by mouth 2 (two) times daily.    [provider]  FLUoxetine (PROZAC) 10 MG capsule Take 5 capsules (50 mg total) by mouth daily. 08/01/18   Howard Pouch, MD  folic acid (FOLVITE) 400 MCG tablet Take 800 mcg by mouth daily.    [provider]  furosemide (LASIX) 40 MG tablet Take 1 tablet (40 mg total) by mouth 2 (two) times daily. 08/19/16   Rai, Delene Ruffini, MD  labetalol (NORMODYNE) 100 MG tablet TAKE 1/2 TABLET BY MOUTH DAILY 01/10/23   Sater, Pearletha Furl, MD  lidocaine-prilocaine (EMLA) cream Apply one inch before port-a cath access prn 03/23/21   Sater, Pearletha Furl, MD  Multiple Vitamin (MULTIVITAMIN) tablet Take 1 tablet by mouth daily.    [provider]  OXcarbazepine (TRILEPTAL) 150 MG tablet Take 1 tablet (150 mg total) by mouth 3 (three) times daily. 12/04/22   Sater, Pearletha Furl, MD  potassium chloride (KLOR-CON) 10 MEQ tablet Take 10 mEq  by mouth daily. 08/13/21   [provider]  QUEtiapine (SEROQUEL) 25 MG tablet Take 1 tablet (25 mg total) by mouth at bedtime. 07/31/18   Howard Pouch, MD  rosuvastatin (CRESTOR) 40 MG tablet Take 40 mg by mouth daily.    [provider]  sodium chloride (OCEAN) 0.65 % SOLN nasal spray Place 1 spray into both nostrils as needed for congestion. 08/19/16   Rai, Ripudeep Kirtland Bouchard, MD  Teriflunomide 14 MG TABS Take 1 tablet (14 mg total) by mouth daily. 12/06/22   Sater, Pearletha Furl, MD  Teriflunomide 14 MG TABS Take 1 tablet (14 mg total) by mouth daily. 12/13/22   Sater, Pearletha Furl, MD  tolterodine (DETROL) 2 MG tablet Take 2 mg by mouth 2 (two) times daily.  05/09/18   [provider]      Allergies     Nitrofurantoin, Oxycodone-acetaminophen, Risperidone and related, and Penicillins    Review of Systems   Review of Systems  Constitutional:  Negative for fever.  Skin:  Positive for wound.    Physical Exam Updated Vital Signs BP 123/63   Pulse 93   Temp 97.9 F (36.6 C)   Resp 14   SpO2 98%  Physical Exam Vitals and nursing note reviewed.  Constitutional:      General: She is not in acute distress.    Appearance: She is well-developed.  HENT:     Head: Normocephalic and atraumatic.     Nose:     Comments: +fluctuance mass originated at septum into right nare w/purulent drainage. Erythema to tip of nose  Eyes:     Conjunctiva/sclera: Conjunctivae normal.  Cardiovascular:     Rate and Rhythm: Normal rate and regular rhythm.     Heart sounds: No murmur heard. Pulmonary:     Effort: Pulmonary effort is normal. No respiratory distress.     Breath sounds: Normal breath sounds.  Abdominal:     Palpations: Abdomen is soft.     Tenderness: There is no abdominal tenderness.  Musculoskeletal:        General: No swelling.     Cervical back: Neck supple.  Skin:    General: Skin is warm and dry.     Capillary Refill: Capillary refill takes less than 2 seconds.  Neurological:     Mental Status: She is alert.  Psychiatric:        Mood and Affect: Mood normal.     ED Results / Procedures / Treatments   Labs (all labs ordered are listed, but only abnormal results are displayed) Labs Reviewed  CBC - Abnormal; Notable for the following components:      Result Value   WBC 12.7 (*)    RBC 3.83 (*)    Hemoglobin 11.8 (*)    All other components within normal limits  BASIC METABOLIC PANEL - Abnormal; Notable for the following components:   Potassium 3.4 (*)    Glucose, Bld 120 (*)    Creatinine, Ser 1.09 (*)    GFR, Estimated 59 (*)    All other components within normal limits  AEROBIC CULTURE W GRAM STAIN (SUPERFICIAL SPECIMEN)  MRSA NEXT GEN BY PCR, NASAL  LACTIC ACID,  PLASMA    EKG None  Radiology CT Maxillofacial W Contrast  Result Date: 01/20/2023 CLINICAL DATA:  Initial evaluation for nasal abscess. EXAM: CT MAXILLOFACIAL WITH CONTRAST TECHNIQUE: Multidetector CT imaging of the maxillofacial structures was performed with intravenous contrast. Multiplanar CT image reconstructions were also generated. RADIATION DOSE REDUCTION:  This exam was performed according to the departmental dose-optimization program which includes automated exposure control, adjustment of the mA and/or kV according to patient size and/or use of iterative reconstruction technique. CONTRAST:  75mL OMNIPAQUE IOHEXOL 350 MG/ML SOLN COMPARISON:  None Available. FINDINGS: Osseous: No acute osseous finding. No discrete or worrisome osseous lesions. Orbits: Globes and orbital soft tissues within normal limits. Sinuses: Paranasal sinuses are largely clear. Trace right mastoid effusion, of doubtful significance. Soft tissues: Localized swelling seen at the anterior aspect of the nasal septum, just inferior to the nasal vestibule. Hypodense collection at this location measuring 10 x 8 x 8 mm, consistent with a Hermione Havlicek abscess (series 3, image 46). No other acute soft tissue abnormality about the face. Limited intracranial: Atrophy with chronic [place] vessel ischemic disease. Probable benign arachnoid cyst at the left middle cranial fossa. IMPRESSION: 10 x 8 x 8 mm abscess just inferior to the nasal vestibule. Electronically Signed   By: Rise Mu M.D.   On: 01/20/2023 21:08    Procedures Procedures    Medications Ordered in ED Medications  cefdinir (OMNICEF) capsule 300 mg (300 mg Oral Given 01/20/23 1834)  acetaminophen (TYLENOL) tablet 650 mg (650 mg Oral Given 01/20/23 1817)  metroNIDAZOLE (FLAGYL) tablet 500 mg (500 mg Oral Given 01/20/23 1833)  doxycycline (VIBRA-TABS) tablet 100 mg (100 mg Oral Given 01/20/23 1833)  iohexol (OMNIPAQUE) 350 MG/ML injection 75 mL (75 mLs Intravenous Contrast  Given 01/20/23 2054)    ED Course/ Medical Decision Making/ A&P                             Medical Decision Making Patient is a 57 y.o. female, here for abscess to R narex7 days. Has been enlarging. Will obtain CT maxillofacial to evaluate degree of abscess, additionally was able to manually express purulent material from wound for comfort and for wound sample. Discussed with pharmacy, given penicillin allergy, will start on flagyl, doxy, and cefdinir for coverage.   Amount and/or Complexity of Data Reviewed Labs: ordered.    Details: Leukocytosis of 12k Radiology: ordered. Discussion of management or test interpretation with external provider(s): Discussed with [facility], she will take over patient, pending CT maxillofacial, and possible ENT evaluation/consult.  She will follow-up on the results of this, and further disposition.  Risk OTC drugs. Prescription drug management.    Final Clinical Impression(s) / ED Diagnoses Final diagnoses:  None    Rx / DC Orders ED Discharge Orders     None         Jersee Winiarski, Harley [place], PA 01/20/23 2115    Gerhard Munch, MD 01/20/23 2349

## 2023-01-20 NOTE — Discharge Instructions (Addendum)
It was a pleasure caring for you today. CT was not concerned for deep space infection. I have sent antibiotics to your pharmacy. Please make sure to take entire course of antibiotics. Your abscess does not require ENT follow up at this time.   Seek emergency care if experiencing any new or worsening symptoms such as severe fever, trouble breathing, or if there is no resolution in symptoms over the next week.

## 2023-01-21 ENCOUNTER — Telehealth (HOSPITAL_COMMUNITY): Payer: Self-pay | Admitting: Medical

## 2023-01-21 LAB — AEROBIC CULTURE W GRAM STAIN (SUPERFICIAL SPECIMEN)

## 2023-01-21 MED ORDER — CEFDINIR 300 MG PO CAPS: 300.0000 mg | ORAL_CAPSULE | Freq: Two times a day (BID) | ORAL | 0 refills | Status: DC

## 2023-01-21 MED ORDER — METRONIDAZOLE 500 MG PO TABS
500.0000 mg | ORAL_TABLET | Freq: Two times a day (BID) | ORAL | 0 refills | Status: DC
Start: 2023-01-21 — End: 2023-01-26

## 2023-01-21 MED ORDER — DOXYCYCLINE HYCLATE 100 MG PO CAPS: 100.0000 mg | ORAL_CAPSULE | Freq: Two times a day (BID) | ORAL | 0 refills | Status: DC

## 2023-01-21 NOTE — Telephone Encounter (Signed)
Contacted by social work, patient's meds were not sent to pharmacy by discharging provider. Sent meds to [facility] in [place].

## 2023-01-22 ENCOUNTER — Other Ambulatory Visit: Payer: Self-pay

## 2023-01-22 ENCOUNTER — Encounter (HOSPITAL_COMMUNITY): Payer: Self-pay | Admitting: *Deleted

## 2023-01-22 ENCOUNTER — Emergency Department (HOSPITAL_COMMUNITY)
Admission: EM | Admit: 2023-01-22 | Discharge: 2023-01-22 | Disposition: A | Discharge status: Home / Self Care | Payer: Medicare Other | Attending: Emergency Medicine | Admitting: Emergency Medicine

## 2023-01-22 DIAGNOSIS — R410 Disorientation, unspecified: Secondary | ICD-10-CM | POA: Insufficient documentation

## 2023-01-22 DIAGNOSIS — Z7982 Long term (current) use of aspirin: Secondary | ICD-10-CM | POA: Insufficient documentation

## 2023-01-22 DIAGNOSIS — E876 Hypokalemia: Secondary | ICD-10-CM | POA: Insufficient documentation

## 2023-01-22 DIAGNOSIS — Z79899 Other long term (current) drug therapy: Secondary | ICD-10-CM | POA: Insufficient documentation

## 2023-01-22 DIAGNOSIS — J329 Chronic sinusitis, unspecified: Secondary | ICD-10-CM | POA: Diagnosis not present

## 2023-01-22 DIAGNOSIS — J340 Abscess, furuncle and carbuncle of nose: Secondary | ICD-10-CM

## 2023-01-22 LAB — COMPREHENSIVE METABOLIC PANEL
ALT: 35 U/L (ref 0–44)
AST: 25 U/L (ref 15–41)
Albumin: 3.1 g/dL — ABNORMAL LOW (ref 3.5–5.0)
Alkaline Phosphatase: 110 U/L (ref 38–126)
Anion gap: 14 (ref 5–15)
BUN: 19 mg/dL (ref 6–20)
CO2: 22 mmol/L (ref 22–32)
Calcium: 9 mg/dL (ref 8.9–10.3)
Chloride: 104 mmol/L (ref 98–111)
Creatinine, Ser: 1.05 mg/dL — ABNORMAL HIGH (ref 0.44–1.00)
GFR, Estimated: >60 mL/min (ref >60)
Glucose, Bld: 155 mg/dL — ABNORMAL HIGH (ref 70–99)
Potassium: 3 mmol/L — ABNORMAL LOW (ref 3.5–5.1)
Sodium: 140 mmol/L (ref 135–145)
Total Bilirubin: 0.5 mg/dL (ref 0.3–1.2)
Total Protein: 6.4 g/dL — ABNORMAL LOW (ref 6.5–8.1)

## 2023-01-22 LAB — CBC WITH DIFFERENTIAL/PLATELET
Abs Immature Granulocytes: 0.04 10*3/uL (ref 0.00–0.07)
Basophils Absolute: 0.1 10*3/uL (ref 0.0–0.1)
Basophils Relative: 1 %
Eosinophils Absolute: 0.3 10*3/uL (ref 0.0–0.5)
Eosinophils Relative: 3 %
HCT: 36.9 % (ref 36.0–46.0)
Hemoglobin: 11.5 g/dL — ABNORMAL LOW (ref 12.0–15.0)
Immature Granulocytes: 0 %
Lymphocytes Relative: 6 %
Lymphs Abs: 0.6 10*3/uL — ABNORMAL LOW (ref 0.7–4.0)
MCH: 29.1 pg (ref 26.0–34.0)
MCHC: 31.2 g/dL (ref 30.0–36.0)
MCV: 93.4 fL (ref 80.0–100.0)
Monocytes Absolute: 1.2 10*3/uL — ABNORMAL HIGH (ref 0.1–1.0)
Monocytes Relative: 12 %
Neutro Abs: 8.1 10*3/uL — ABNORMAL HIGH (ref 1.7–7.7)
Neutrophils Relative %: 78 %
Platelets: 206 10*3/uL (ref 150–400)
RBC: 3.95 MIL/uL (ref 3.87–5.11)
RDW: 14.9 % (ref 11.5–15.5)
WBC: 10.3 10*3/uL (ref 4.0–10.5)
nRBC: 0 % (ref 0.0–0.2)

## 2023-01-22 LAB — AEROBIC CULTURE W GRAM STAIN (SUPERFICIAL SPECIMEN)

## 2023-01-22 MED ORDER — POTASSIUM CHLORIDE CRYS ER 20 MEQ PO TBCR: 20.0000 meq | EXTENDED_RELEASE_TABLET | Freq: Once | ORAL | 0 refills | Status: DC

## 2023-01-22 MED ORDER — POTASSIUM CHLORIDE CRYS ER 20 MEQ PO TBCR
40.0000 meq | EXTENDED_RELEASE_TABLET | Freq: Once | ORAL | Status: AC
  Administered 2023-01-22: 40 meq via ORAL
  Filled 2023-01-22: qty 2

## 2023-01-22 NOTE — Discharge Instructions (Addendum)
Your potassium was low today.  Take potassium supplement. The culture of your nose shows MRSA.  You can stop Metronidazole.  The pharmacist reviewed your medications and did not find any interactions.   Call your Neurologist to schedule recheck

## 2023-01-22 NOTE — ED Triage Notes (Signed)
Patient was seen in the ED on Sat for infected area on her nose was given antibiotics and sent home with some . Started taking antibioitcs yest , husband said she stared hallucinating seeing things that weren't there and getting angry.

## 2023-01-22 NOTE — ED Notes (Signed)
Offered to assist pt to move from wheelchair to stretcher and pt and family decline. Pt wants to stay in the wheelchair

## 2023-01-22 NOTE — ED Provider Notes (Cosign Needed Addendum)
[place] EMERGENCY DEPARTMENT AT [facility] Provider Note   CSN: [id number] Arrival date & time: 01/22/23  1003     History  Chief Complaint  Patient presents with   Hallucinations    Vanessa Short is a 57 y.o. female.  Patient complains of some confusion.  Patient's husband reports that patient is currently taking 3 antibiotics for an infection to her nose.  Patient was seen here 2 days ago and started on doxycycline cefdinir and metronidazole for a nasal abscess.  Patient and husband are concerned that she is having a reaction to the medications.  Patient has not had any shortness of breath she has not had any rash.  Patient reports she is experienced some chills after she eats.  Patient reports some decrease in her appetite patient has also recently been on an antibiotic for a urinary tract infection.  Patient has a past medical history of MS.  Patient denies any fever or chills she has not had any nausea or vomiting.  The history is provided by the patient and the spouse. No language interpreter was used.       Home Medications Prior to Admission medications   Medication Sig Start Date End Date Taking? Authorizing Provider  acetaminophen (TYLENOL) 500 MG tablet Take 1,000 mg by mouth at bedtime.    [provider]  acyclovir (ZOVIRAX) 400 MG tablet Take 400 mg by mouth daily. Take every day per patient 08/11/14   [provider]  AMITIZA 8 MCG capsule Take 8 mcg by mouth 3 (three) times daily. GI MD prescribing- will take 2 caps BID prn 06/11/18   [provider]  aspirin EC 81 MG tablet Take 81 mg by mouth daily.    [provider]  b complex vitamins tablet Take 1 tablet by mouth daily.     [provider]  baclofen (LIORESAL) 20 MG tablet One po qid 01/09/23   Sater, Pearletha Furl, MD  cefdinir (OMNICEF) 300 MG capsule Take 1 capsule (300 mg total) by mouth 2 (two) times daily. 01/21/23   Small, Brooke L, PA   cholecalciferol (VITAMIN D) 1000 UNITS tablet Take 1,000 Units by mouth daily.     [provider]  ciprofloxacin (CIPRO) 250 MG tablet Take 250 mg by mouth 2 (two) times daily.    [provider]  dantrolene (DANTRIUM) 50 MG capsule Take 1 capsule (50 mg total) by mouth in the morning, at noon, in the evening, and at bedtime. 12/04/22   Sater, Pearletha Furl, MD  diazepam (VALIUM) 5 MG tablet One po qHS 10/17/22   Sater, Pearletha Furl, MD  doxycycline (VIBRAMYCIN) 100 MG capsule Take 1 capsule (100 mg total) by mouth 2 (two) times daily. 01/21/23   Small, Brooke L, PA  famotidine (PEPCID) 20 MG tablet Take 40 mg by mouth 2 (two) times daily.    [provider]  FLUoxetine (PROZAC) 10 MG capsule Take 5 capsules (50 mg total) by mouth daily. 08/01/18   Howard Pouch, MD  folic acid (FOLVITE) 400 MCG tablet Take 800 mcg by mouth daily.    [provider]  furosemide (LASIX) 40 MG tablet Take 1 tablet (40 mg total) by mouth 2 (two) times daily. 08/19/16   Rai, Delene Ruffini, MD  labetalol (NORMODYNE) 100 MG tablet TAKE 1/2 TABLET BY MOUTH DAILY 01/10/23   Sater, Pearletha Furl, MD  lidocaine-prilocaine (EMLA) cream Apply one inch before port-a cath access prn 03/23/21   Sater, Pearletha Furl,  MD  metroNIDAZOLE (FLAGYL) 500 MG tablet Take 1 tablet (500 mg total) by mouth 2 (two) times daily. 01/21/23   Small, Brooke L, PA  Multiple Vitamin (MULTIVITAMIN) tablet Take 1 tablet by mouth daily.    [provider]  OXcarbazepine (TRILEPTAL) 150 MG tablet Take 1 tablet (150 mg total) by mouth 3 (three) times daily. 12/04/22   Sater, Pearletha Furl, MD  potassium chloride (KLOR-CON) 10 MEQ tablet Take 10 mEq by mouth daily. 08/13/21   [provider]  QUEtiapine (SEROQUEL) 25 MG tablet Take 1 tablet (25 mg total) by mouth at bedtime. 07/31/18   Howard Pouch, MD  rosuvastatin (CRESTOR) 40 MG tablet Take 40 mg by mouth daily.    [provider]  sodium chloride (OCEAN) 0.65 % SOLN nasal  spray Place 1 spray into both nostrils as needed for congestion. 08/19/16   Rai, Ripudeep Kirtland Bouchard, MD  Teriflunomide 14 MG TABS Take 1 tablet (14 mg total) by mouth daily. 12/06/22   Sater, Pearletha Furl, MD  Teriflunomide 14 MG TABS Take 1 tablet (14 mg total) by mouth daily. 12/13/22   Sater, Pearletha Furl, MD  tolterodine (DETROL) 2 MG tablet Take 2 mg by mouth 2 (two) times daily.  05/09/18   [provider]      Allergies    Nitrofurantoin, Oxycodone-acetaminophen, Risperidone and related, and Penicillins    Review of Systems   Review of Systems  Constitutional:  Positive for chills and fever.  All other systems reviewed and are negative.   Physical Exam Updated Vital Signs BP (!) 140/78   Pulse 97   Temp 97.7 F (36.5 C)   Resp 18   Ht 5\' 3"  (1.6 m)   Wt 81.6 kg   SpO2 100%   BMI 31.89 kg/m  Physical Exam Vitals and nursing note reviewed.  Constitutional:      Appearance: She is well-developed.  HENT:     Head: Normocephalic.     Nose:     Comments: Scabbed area right nostril, no cellulitis.  Area appears smaller than what was previously described Cardiovascular:     Rate and Rhythm: Normal rate.  Pulmonary:     Effort: Pulmonary effort is normal.  Abdominal:     General: There is no distension.  Musculoskeletal:        General: Normal range of motion.     Cervical back: Normal range of motion.  Neurological:     Mental Status: She is alert and oriented to person, place, and time.  Psychiatric:        Mood and Affect: Mood normal.     ED Results / Procedures / Treatments   Labs (all labs ordered are listed, but only abnormal results are displayed) Labs Reviewed - No data to display  EKG None  Radiology CT Maxillofacial W Contrast  Result Date: 01/20/2023 CLINICAL DATA:  Initial evaluation for nasal abscess. EXAM: CT MAXILLOFACIAL WITH CONTRAST TECHNIQUE: Multidetector CT imaging of the maxillofacial structures was performed with intravenous contrast.  Multiplanar CT image reconstructions were also generated. RADIATION DOSE REDUCTION: This exam was performed according to the departmental dose-optimization program which includes automated exposure control, adjustment of the mA and/or kV according to patient size and/or use of iterative reconstruction technique. CONTRAST:  75mL OMNIPAQUE IOHEXOL 350 MG/ML SOLN COMPARISON:  None Available. FINDINGS: Osseous: No acute osseous finding. No discrete or worrisome osseous lesions. Orbits: Globes and orbital soft tissues within normal limits. Sinuses: Paranasal sinuses are largely clear. Trace right  mastoid effusion, of doubtful significance. Soft tissues: Localized swelling seen at the anterior aspect of the nasal septum, just inferior to the nasal vestibule. Hypodense collection at this location measuring 10 x 8 x 8 mm, consistent with a small abscess (series 3, image 46). No other acute soft tissue abnormality about the face. Limited intracranial: Atrophy with chronic small vessel ischemic disease. Probable benign arachnoid cyst at the left middle cranial fossa. IMPRESSION: 10 x 8 x 8 mm abscess just inferior to the nasal vestibule. Electronically Signed   By: Rise Mu M.D.   On: 01/20/2023 21:08    Procedures Procedures    Medications Ordered in ED Medications - No data to display  ED Course/ Medical Decision Making/ A&P                             Medical Decision Making Patient complains of confusion since beginning antibiotics 2 days ago.  Amount and/or Complexity of Data Reviewed Independent Historian:     Details: Patient is here with her husband who is supportive and provides most of patient's history External Data Reviewed: notes.    Details: Allergy notes reviewed Labs: ordered. Decision-making details documented in ED Course.    Details: Labs ordered reviewed and interpreted.  Patient has a potassium of 3 Discussion of management or test interpretation with external  provider(s): I had pharmacist review patient's medications.  He does not feel like there are any current drug reactions.  Risk Prescription drug management. Risk Details: I discussed with patient and her husband her laboratory findings.  Patient's husband is concerned about what could be causing the patient to have hallucinations.  She is currently alert conversational does not appear to be having hallucinations at this time.  I offered hospitalist consult for further evaluation possible observation and neurology consult.  Patient does not want to be hospitalized she states she is more comfortable at home and feels that she gets better care at home.  Patient has decreased potassium this may be partially the cause of her symptoms.  I will give her 40 mEq of potassium here patient is advised to follow-up with her primary care physician.  I reviewed patient's culture which was positive for MRSA from the infection in her nose.  I think patient can probably stop metronidazole.  I think patient would benefit from rechecking with her neurologist.  I counseled patient and her husband on the need to return if fever chills or worsening symptoms.           Final Clinical Impression(s) / ED Diagnoses Final diagnoses:  Nasal abscess  Hypokalemia    Rx / DC Orders ED Discharge Orders     None     An After Visit Summary was printed and given to the patient.     Elson Areas, [name] 01/22/23 1750    Elson Areas, [name] 01/22/23 1821    Loetta Rough, MD 01/24/23 1346

## 2023-01-23 ENCOUNTER — Telehealth (HOSPITAL_BASED_OUTPATIENT_CLINIC_OR_DEPARTMENT_OTHER): Payer: Self-pay | Admitting: *Deleted

## 2023-01-23 NOTE — Telephone Encounter (Signed)
Post ED Visit - Positive Culture Follow-up  Culture report reviewed by antimicrobial stewardship pharmacist: [facility] [x]  Andreas Ohm Pharm.D. []  Celedonio Miyamoto, Pharm.D., BCPS AQ-ID []  Garvin Fila, Pharm.D., BCPS []  Georgina Pillion, Pharm.D., BCPS []  [place], [address].D., BCPS, AAHIVP []  Estella Husk, Pharm.D., BCPS, AAHIVP []  Lysle Pearl, PharmD, BCPS []  Phillips Climes, PharmD, BCPS []  Agapito Games, PharmD, BCPS []  Verlan Friends, PharmD []  Mervyn Gay, PharmD, BCPS []  Vinnie Level, PharmD  [facility] []  Len Childs, PharmD []  Greer Pickerel, PharmD []  Adalberto Cole, PharmD []  Perlie Gold, Rph []  Lonell Face) Jean Rosenthal, PharmD []  Earl Many, PharmD []  Junita Push, PharmD []  Dorna Leitz, PharmD []  Terrilee Files, PharmD []  Lynann Beaver, PharmD []  Keturah Barre, PharmD []  Loralee Pacas, PharmD []  Bernadene Person, PharmD   Positive wound culture Treated with Doxycycline, organism sensitive to the same and no further patient follow-up is required at this time.  [facility] 01/23/2023, 7:24 AM

## 2023-01-25 ENCOUNTER — Encounter (HOSPITAL_COMMUNITY): Payer: Self-pay

## 2023-01-25 ENCOUNTER — Other Ambulatory Visit: Payer: Self-pay

## 2023-01-25 ENCOUNTER — Emergency Department (HOSPITAL_COMMUNITY): Payer: Medicare Other

## 2023-01-25 ENCOUNTER — Observation Stay (HOSPITAL_COMMUNITY)
Admission: EM | Admit: 2023-01-25 | Discharge: 2023-01-28 | Disposition: A | Discharge status: Home / Self Care | Payer: Medicare Other | Attending: Internal Medicine | Admitting: Internal Medicine

## 2023-01-25 DIAGNOSIS — F05 Delirium due to known physiological condition: Secondary | ICD-10-CM | POA: Diagnosis present

## 2023-01-25 DIAGNOSIS — I10 Essential (primary) hypertension: Secondary | ICD-10-CM | POA: Diagnosis not present

## 2023-01-25 DIAGNOSIS — R2689 Other abnormalities of gait and mobility: Secondary | ICD-10-CM | POA: Insufficient documentation

## 2023-01-25 DIAGNOSIS — R4182 Altered mental status, unspecified: Principal | ICD-10-CM | POA: Insufficient documentation

## 2023-01-25 DIAGNOSIS — G35 Multiple sclerosis: Secondary | ICD-10-CM | POA: Diagnosis present

## 2023-01-25 DIAGNOSIS — Z79899 Other long term (current) drug therapy: Secondary | ICD-10-CM | POA: Insufficient documentation

## 2023-01-25 DIAGNOSIS — Z7982 Long term (current) use of aspirin: Secondary | ICD-10-CM | POA: Diagnosis not present

## 2023-01-25 DIAGNOSIS — B9562 Methicillin resistant Staphylococcus aureus infection as the cause of diseases classified elsewhere: Secondary | ICD-10-CM | POA: Diagnosis not present

## 2023-01-25 DIAGNOSIS — K5190 Ulcerative colitis, unspecified, without complications: Secondary | ICD-10-CM | POA: Diagnosis present

## 2023-01-25 DIAGNOSIS — A4902 Methicillin resistant Staphylococcus aureus infection, unspecified site: Secondary | ICD-10-CM | POA: Diagnosis present

## 2023-01-25 HISTORY — DX: Herpesviral infection of other urogenital tract: A60.09

## 2023-01-25 HISTORY — DX: Herpesviral gingivostomatitis and pharyngotonsillitis: B00.2

## 2023-01-25 LAB — CBC WITH DIFFERENTIAL/PLATELET
Abs Immature Granulocytes: 0.04 10*3/uL (ref 0.00–0.07)
Basophils Absolute: 0.1 10*3/uL (ref 0.0–0.1)
Basophils Relative: 1 %
Eosinophils Absolute: 0.3 10*3/uL (ref 0.0–0.5)
Eosinophils Relative: 3 %
HCT: 39.6 % (ref 36.0–46.0)
Hemoglobin: 12.2 g/dL (ref 12.0–15.0)
Immature Granulocytes: 0 %
Lymphocytes Relative: 8 %
Lymphs Abs: 1 10*3/uL (ref 0.7–4.0)
MCH: 30 pg (ref 26.0–34.0)
MCHC: 30.8 g/dL (ref 30.0–36.0)
MCV: 97.3 fL (ref 80.0–100.0)
Monocytes Absolute: 1.2 10*3/uL — ABNORMAL HIGH (ref 0.1–1.0)
Monocytes Relative: 10 %
Neutro Abs: 9.4 10*3/uL — ABNORMAL HIGH (ref 1.7–7.7)
Neutrophils Relative %: 78 %
Platelets: 217 10*3/uL (ref 150–400)
RBC: 4.07 MIL/uL (ref 3.87–5.11)
RDW: 16 % — ABNORMAL HIGH (ref 11.5–15.5)
WBC: 12 10*3/uL — ABNORMAL HIGH (ref 4.0–10.5)
nRBC: 0 % (ref 0.0–0.2)

## 2023-01-25 LAB — COMPREHENSIVE METABOLIC PANEL
ALT: 40 U/L (ref 0–44)
AST: 29 U/L (ref 15–41)
Albumin: 3.3 g/dL — ABNORMAL LOW (ref 3.5–5.0)
Alkaline Phosphatase: 107 U/L (ref 38–126)
Anion gap: 9 (ref 5–15)
BUN: 31 mg/dL — ABNORMAL HIGH (ref 6–20)
CO2: 25 mmol/L (ref 22–32)
Calcium: 8.8 mg/dL — ABNORMAL LOW (ref 8.9–10.3)
Chloride: 107 mmol/L (ref 98–111)
Creatinine, Ser: 1.07 mg/dL — ABNORMAL HIGH (ref 0.44–1.00)
GFR, Estimated: >60 mL/min (ref >60)
Glucose, Bld: 123 mg/dL — ABNORMAL HIGH (ref 70–99)
Potassium: 3.7 mmol/L (ref 3.5–5.1)
Sodium: 141 mmol/L (ref 135–145)
Total Bilirubin: 0.5 mg/dL (ref 0.3–1.2)
Total Protein: 6.7 g/dL (ref 6.5–8.1)

## 2023-01-25 LAB — MAGNESIUM: Magnesium: 2.2 mg/dL (ref 1.7–2.4)

## 2023-01-25 MED ORDER — LACTATED RINGERS IV BOLUS
1000.0000 mL | Freq: Once | INTRAVENOUS | Status: AC
  Administered 2023-01-25: 1000 mL via INTRAVENOUS

## 2023-01-25 NOTE — ED Triage Notes (Signed)
Patient brought in by husband, reports altered mental status worsening today. Husband states she had been in ED for nose infection and hallucinations over the weekend. States she has been staring off into space and some delayed responses. Has been on multiple different abx.

## 2023-01-25 NOTE — ED Notes (Signed)
RN assumed care of pt, found her still in her wheelchair.  RN found pt alert and oriented but a little tired. Spouse preferred to give hx indicating that she had been seen several times for an abscess on her nose that was not getting better.  He reports that pt has had moments when she felt that "things were crawling on her... sometimes she just sits and stares not responding directly to questions."  RN did not observe this at this time.

## 2023-01-25 NOTE — ED Provider Notes (Signed)
[facility] AT [facility] Provider Note   CSN: [id number] Arrival date & time: 01/25/23  2047     History Chief Complaint  Patient presents with   Altered Mental Status    HPI Vanessa Short is a 57 y.o. female presenting for chief complaint of altered mental status. Patient has an accepted was not medical history extensive history of multiple sclerosis on multiple rotating immunologic agents, history of elevated JC levels,, spastic quadriparesis, history of depression, ulcerative colitis, history of heart failure, history of hallucinations. Is brought in by her husband for increasing frequency of hallucinations over the last week.  She is having progressive daily tactile and visual hallucinations is responding to internal stimuli. Notably, patient's husband references an episode from 2018 where she had a very similar presentation She was started on empiric treatment for suspected underlying pneumonia and was seen by psychiatry and neurology.  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 Changing her medications at this time (long-term psych meds 41yrs) would probably do more harm than could. We are clearing her from psychiatry as this is behavioral and a conscious decision on the part of the patient with whom she chooses to communicate. I believe neurology is signing off as well and internal medicine can decide when they want to clear her and discharge her medically. The husband should be encouraged to be diligent about urging the patient to participate in her treatment plan.   Patient's recorded medical, surgical, social, medication list and allergies were reviewed in the Snapshot window as part of the initial history.   Review of Systems   Review  of Systems  Constitutional:  Negative for chills and fever.  HENT:  Negative for ear pain and sore throat.   Eyes:  Negative for pain and visual disturbance.  Respiratory:  Negative for cough and shortness of breath.   Cardiovascular:  Negative for chest pain and palpitations.  Gastrointestinal:  Negative for abdominal pain and vomiting.  Genitourinary:  Negative for dysuria and hematuria.  Musculoskeletal:  Negative for arthralgias and back pain.  Skin:  Negative for color change and rash.  Neurological:  Negative for seizures and syncope.  Psychiatric/Behavioral:  Positive for behavioral problems and confusion.   All other systems reviewed and are negative.   Physical Exam Updated Vital Signs BP (!) 144/91 (BP Location: Right Arm)   Pulse 89   Temp 97.9 F (36.6 C) (Oral)   Resp 18   Ht 5\' 3"  (1.6 m)   Wt 81.6 kg   SpO2 100%   BMI 31.89 kg/m  Physical Exam Vitals and nursing note reviewed.  Constitutional:      General: She is not in acute distress.    Appearance: She is well-developed.  HENT:     Head: Normocephalic and atraumatic.  Eyes:     Conjunctiva/sclera: Conjunctivae normal.  Cardiovascular:     Rate and Rhythm: Normal rate and regular rhythm.     Heart sounds: No murmur heard. Pulmonary:     Effort: Pulmonary effort is normal. No respiratory distress.     Breath sounds: Normal breath sounds.  Abdominal:     General: There is no distension.     Palpations: Abdomen is soft.     Tenderness: There is no abdominal tenderness. There is no right  CVA tenderness or left CVA tenderness.  Musculoskeletal:        General: No swelling or tenderness. Normal range of motion.     Cervical back: Neck supple.  Skin:    General: Skin is warm and dry.  Neurological:     Mental Status: She is alert.     Comments: Spastic quadriplegic.  Having episodic staring episodes approximately 5 to 6/h per the husband.  Having worsening hallucinations and confusion.      ED  Course/ Medical Decision Making/ A&P    Procedures Procedures   Medications Ordered in ED Medications  lactated ringers bolus 1,000 mL (1,000 mLs Intravenous New Bag/Given 01/25/23 2236)   Medical Decision Making:   Vanessa Short is a 57 y.o. female who presented to the ED today with altered mental status detailed above.    Patient's presentation is complicated by their history of multiple comorbid medical problems.  Patient placed on continuous vitals and telemetry monitoring while in ED which was reviewed periodically.  Complete initial physical exam performed, notably the patient  was HDS in NAD.    Reviewed and confirmed nursing documentation for past medical history, family history, social history.    Initial Assessment:   With the patient's presentation of altered mental status, most likely diagnosis is delerium 2/2 infectious etiology (UTI/CAP/URI) vs metabolic abnormality (Na/K/Mg/Ca) vs nonspecific etiology. Other diagnoses were considered including (but not limited to) CVA, ICH, intracranial mass, critical dehydration, heptatic dysfunction, uremia, hypercarbia, intoxication, endrocrine abnormality, toxidrome. These are considered less likely due to history of present illness and physical exam findings.   This is most consistent with an acute life/limb threatening illness complicated by underlying chronic conditions.  Initial Plan:  Screening labs including CBC and Metabolic panel to evaluate for infectious or metabolic etiology of disease.  Urinalysis performed this morning by [facility] reviewed and reassuring CXR to evaluate for structural/infectious intrathoracic pathology.  Objective evaluation as below reviewed   Initial Study Results:   Laboratory  All laboratory results reviewed without evidence of clinically relevant pathology.   Radiology:  All images reviewed independently. Agree with radiology report at this time.   DG Chest Portable 1 View  Result Date:  01/25/2023 CLINICAL DATA:  Altered mental status, shortness of breath. EXAM: PORTABLE CHEST 1 VIEW COMPARISON:  June 23, 2018 FINDINGS: There is stable right-sided venous Port-A-Cath positioning. The heart size and mediastinal contours are within normal limits. There is no evidence of acute infiltrate, pleural effusion or pneumothorax. The visualized skeletal structures are unremarkable. IMPRESSION: No active disease. Electronically Signed   By: Aram Candela M.D.   On: 01/25/2023 21:57   CT Maxillofacial W Contrast  Result Date: 01/20/2023 CLINICAL DATA:  Initial evaluation for nasal abscess. EXAM: CT MAXILLOFACIAL WITH CONTRAST TECHNIQUE: Multidetector CT imaging of the maxillofacial structures was performed with intravenous contrast. Multiplanar CT image reconstructions were also generated. RADIATION DOSE REDUCTION: This exam was performed according to the departmental dose-optimization program which includes automated exposure control, adjustment of the mA and/or kV according to patient size and/or use of iterative reconstruction technique. CONTRAST:  75mL OMNIPAQUE IOHEXOL 350 MG/ML SOLN COMPARISON:  None Available. FINDINGS: Osseous: No acute osseous finding. No discrete or worrisome osseous lesions. Orbits: Globes and orbital soft tissues within normal limits. Sinuses: Paranasal sinuses are largely clear. Trace right mastoid effusion, of doubtful significance. Soft tissues: Localized swelling seen at the anterior aspect of the nasal septum, just inferior to the nasal vestibule. Hypodense collection at this location measuring 10  x 8 x 8 mm, consistent with a small abscess (series 3, image 46). No other acute soft tissue abnormality about the face. Limited intracranial: Atrophy with chronic small vessel ischemic disease. Probable benign arachnoid cyst at the left middle cranial fossa. IMPRESSION: 10 x 8 x 8 mm abscess just inferior to the nasal vestibule. Electronically Signed   By: Rise Mu M.D.   On: 01/20/2023 21:08      Consults: Case discussed with on-call with neurology.  They requested transfer for EEG at [facility].  Discussed with Dr. Anitra Lauth who accepted in transfer.  Informed the patient of the plan. Currently the differential remains encephalopathy from undetected infection, metabolic disruption, neurologic etiology such as seizures or MS flare though this seems less likely based on the current presentation or psychiatric etiology. 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  Husband is aware of the above differential and is in agreement.   Clinical Impression:  1. Altered mental status, unspecified altered mental status type      Data Unavailable   Final Clinical Impression(s) / ED Diagnoses Final diagnoses:  Altered mental status, unspecified altered mental status type    Rx / DC Orders ED Discharge Orders     None         Glyn Ade, MD 01/25/23 2328

## 2023-01-25 NOTE — ED Notes (Signed)
RN obtained IV access and started fluids.  Pt was placed in the bed w/ the assistance of a hoyer lift using her own sling.  Pt also changed into dry clean diaper and purewick applied.

## 2023-01-26 ENCOUNTER — Ambulatory Visit: Payer: Medicare Other

## 2023-01-26 ENCOUNTER — Encounter (HOSPITAL_COMMUNITY): Payer: Self-pay | Admitting: Internal Medicine

## 2023-01-26 ENCOUNTER — Observation Stay (HOSPITAL_COMMUNITY): Payer: Medicare Other

## 2023-01-26 DIAGNOSIS — R404 Transient alteration of awareness: Secondary | ICD-10-CM

## 2023-01-26 DIAGNOSIS — F05 Delirium due to known physiological condition: Secondary | ICD-10-CM | POA: Diagnosis not present

## 2023-01-26 DIAGNOSIS — G9349 Other encephalopathy: Secondary | ICD-10-CM

## 2023-01-26 DIAGNOSIS — I10 Essential (primary) hypertension: Secondary | ICD-10-CM | POA: Diagnosis not present

## 2023-01-26 DIAGNOSIS — Z79899 Other long term (current) drug therapy: Secondary | ICD-10-CM | POA: Diagnosis not present

## 2023-01-26 DIAGNOSIS — R4182 Altered mental status, unspecified: Secondary | ICD-10-CM

## 2023-01-26 DIAGNOSIS — B9562 Methicillin resistant Staphylococcus aureus infection as the cause of diseases classified elsewhere: Secondary | ICD-10-CM | POA: Diagnosis not present

## 2023-01-26 DIAGNOSIS — R569 Unspecified convulsions: Secondary | ICD-10-CM | POA: Diagnosis not present

## 2023-01-26 DIAGNOSIS — Z7982 Long term (current) use of aspirin: Secondary | ICD-10-CM | POA: Diagnosis not present

## 2023-01-26 DIAGNOSIS — G35 Multiple sclerosis: Secondary | ICD-10-CM

## 2023-01-26 DIAGNOSIS — A4902 Methicillin resistant Staphylococcus aureus infection, unspecified site: Secondary | ICD-10-CM | POA: Diagnosis present

## 2023-01-26 LAB — URINALYSIS, ROUTINE W REFLEX MICROSCOPIC
Bilirubin Urine: NEGATIVE
Glucose, UA: NEGATIVE mg/dL
Ketones, ur: NEGATIVE mg/dL
Nitrite: NEGATIVE
Protein, ur: NEGATIVE mg/dL
Specific Gravity, Urine: 1.015 (ref 1.005–1.030)
pH: 6 (ref 5.0–8.0)

## 2023-01-26 LAB — CBC WITH DIFFERENTIAL/PLATELET
Abs Immature Granulocytes: 0.04 10*3/uL (ref 0.00–0.07)
Basophils Absolute: 0.1 10*3/uL (ref 0.0–0.1)
Basophils Relative: 1 %
Eosinophils Absolute: 0.3 10*3/uL (ref 0.0–0.5)
Eosinophils Relative: 3 %
HCT: 36.1 % (ref 36.0–46.0)
Hemoglobin: 11.3 g/dL — ABNORMAL LOW (ref 12.0–15.0)
Immature Granulocytes: 0 %
Lymphocytes Relative: 10 %
Lymphs Abs: 1.1 10*3/uL (ref 0.7–4.0)
MCH: 29.5 pg (ref 26.0–34.0)
MCHC: 31.3 g/dL (ref 30.0–36.0)
MCV: 94.3 fL (ref 80.0–100.0)
Monocytes Absolute: 1.2 10*3/uL — ABNORMAL HIGH (ref 0.1–1.0)
Monocytes Relative: 11 %
Neutro Abs: 8.2 10*3/uL — ABNORMAL HIGH (ref 1.7–7.7)
Neutrophils Relative %: 75 %
Platelets: 205 10*3/uL (ref 150–400)
RBC: 3.83 MIL/uL — ABNORMAL LOW (ref 3.87–5.11)
RDW: 15.9 % — ABNORMAL HIGH (ref 11.5–15.5)
WBC: 10.9 10*3/uL — ABNORMAL HIGH (ref 4.0–10.5)
nRBC: 0 % (ref 0.0–0.2)

## 2023-01-26 LAB — COMPREHENSIVE METABOLIC PANEL
ALT: 39 U/L (ref 0–44)
AST: 42 U/L — ABNORMAL HIGH (ref 15–41)
Albumin: 2.9 g/dL — ABNORMAL LOW (ref 3.5–5.0)
Alkaline Phosphatase: 114 U/L (ref 38–126)
Anion gap: 11 (ref 5–15)
BUN: 28 mg/dL — ABNORMAL HIGH (ref 6–20)
CO2: 23 mmol/L (ref 22–32)
Calcium: 8.7 mg/dL — ABNORMAL LOW (ref 8.9–10.3)
Chloride: 106 mmol/L (ref 98–111)
Creatinine, Ser: 1.07 mg/dL — ABNORMAL HIGH (ref 0.44–1.00)
GFR, Estimated: >60 mL/min (ref >60)
Glucose, Bld: 115 mg/dL — ABNORMAL HIGH (ref 70–99)
Potassium: 4.4 mmol/L (ref 3.5–5.1)
Sodium: 140 mmol/L (ref 135–145)
Total Bilirubin: 0.9 mg/dL (ref 0.3–1.2)
Total Protein: 5.8 g/dL — ABNORMAL LOW (ref 6.5–8.1)

## 2023-01-26 LAB — MAGNESIUM: Magnesium: 2.2 mg/dL (ref 1.7–2.4)

## 2023-01-26 LAB — URINALYSIS, MICROSCOPIC (REFLEX)

## 2023-01-26 MED ORDER — LUBIPROSTONE 8 MCG PO CAPS
8.0000 ug | ORAL_CAPSULE | Freq: Every day | ORAL | Status: DC
  Administered 2023-01-27 – 2023-01-28 (×2): 8 ug via ORAL
  Filled 2023-01-26 (×2): qty 1

## 2023-01-26 MED ORDER — FAMOTIDINE 20 MG PO TABS
40.0000 mg | ORAL_TABLET | Freq: Two times a day (BID) | ORAL | Status: DC
  Administered 2023-01-26 – 2023-01-27 (×3): 40 mg via ORAL
  Filled 2023-01-26 (×3): qty 2

## 2023-01-26 MED ORDER — BACLOFEN 10 MG PO TABS
20.0000 mg | ORAL_TABLET | Freq: Four times a day (QID) | ORAL | Status: DC
  Administered 2023-01-26 – 2023-01-27 (×6): 20 mg via ORAL
  Filled 2023-01-26 (×6): qty 2

## 2023-01-26 MED ORDER — DIAZEPAM 5 MG PO TABS: 5.0000 mg | ORAL_TABLET | Freq: Every day | ORAL | Status: DC

## 2023-01-26 MED ORDER — FLUOXETINE HCL 20 MG PO CAPS
20.0000 mg | ORAL_CAPSULE | Freq: Three times a day (TID) | ORAL | Status: DC
  Administered 2023-01-26 – 2023-01-28 (×5): 20 mg via ORAL
  Filled 2023-01-26 (×4): qty 1

## 2023-01-26 MED ORDER — LUBIPROSTONE 8 MCG PO CAPS
16.0000 ug | ORAL_CAPSULE | ORAL | Status: DC
  Administered 2023-01-27: 16 ug via ORAL
  Filled 2023-01-26 (×2): qty 2

## 2023-01-26 MED ORDER — FLUOXETINE HCL 20 MG PO CAPS
50.0000 mg | ORAL_CAPSULE | Freq: Every day | ORAL | Status: DC
  Filled 2023-01-26: qty 1

## 2023-01-26 MED ORDER — ACETAMINOPHEN 650 MG RE SUPP: 650.0000 mg | Freq: Four times a day (QID) | RECTAL | Status: DC | PRN

## 2023-01-26 MED ORDER — DIAZEPAM 5 MG PO TABS: 5.0000 mg | ORAL_TABLET | Freq: Every evening | ORAL | Status: DC | PRN

## 2023-01-26 MED ORDER — MESALAMINE ER 0.375 G PO CP24
1.5000 g | ORAL_CAPSULE | Freq: Every day | ORAL | Status: DC
  Administered 2023-01-26: 1.5 g via ORAL
  Filled 2023-01-26 (×2): qty 4

## 2023-01-26 MED ORDER — TERIFLUNOMIDE 14 MG PO TABS
1.0000 | ORAL_TABLET | Freq: Every day | ORAL | Status: DC
  Administered 2023-01-26 – 2023-01-27 (×2): 14 mg via ORAL
  Filled 2023-01-26 (×3): qty 1

## 2023-01-26 MED ORDER — HYDRALAZINE HCL 20 MG/ML IJ SOLN: 5.0000 mg | INTRAMUSCULAR | Status: DC | PRN

## 2023-01-26 MED ORDER — MUPIROCIN 2 % EX OINT
TOPICAL_OINTMENT | Freq: Two times a day (BID) | CUTANEOUS | Status: DC
  Filled 2023-01-26: qty 22

## 2023-01-26 MED ORDER — LUBIPROSTONE 8 MCG PO CAPS
8.0000 ug | ORAL_CAPSULE | Freq: Every day | ORAL | Status: DC
  Filled 2023-01-26: qty 1

## 2023-01-26 MED ORDER — DOXYCYCLINE HYCLATE 100 MG PO TABS
100.0000 mg | ORAL_TABLET | Freq: Two times a day (BID) | ORAL | Status: DC
  Administered 2023-01-26 – 2023-01-27 (×3): 100 mg via ORAL
  Filled 2023-01-26 (×3): qty 1

## 2023-01-26 MED ORDER — SODIUM CHLORIDE 0.9% FLUSH
3.0000 mL | Freq: Two times a day (BID) | INTRAVENOUS | Status: DC
  Administered 2023-01-26: 3 mL via INTRAVENOUS

## 2023-01-26 MED ORDER — LIDOCAINE-PRILOCAINE 2.5-2.5 % EX CREA
TOPICAL_CREAM | Freq: Once | CUTANEOUS | Status: AC | PRN
  Filled 2023-01-26: qty 5

## 2023-01-26 MED ORDER — LUBIPROSTONE 8 MCG PO CAPS: 8.0000 ug | ORAL_CAPSULE | Freq: Three times a day (TID) | ORAL | Status: DC

## 2023-01-26 MED ORDER — ACYCLOVIR 400 MG PO TABS
400.0000 mg | ORAL_TABLET | Freq: Every day | ORAL | Status: DC
  Administered 2023-01-26 – 2023-01-27 (×2): 400 mg via ORAL
  Filled 2023-01-26 (×3): qty 1

## 2023-01-26 MED ORDER — DANTROLENE SODIUM 25 MG PO CAPS
50.0000 mg | ORAL_CAPSULE | Freq: Four times a day (QID) | ORAL | Status: DC
  Administered 2023-01-26 – 2023-01-27 (×6): 50 mg via ORAL
  Filled 2023-01-26 (×10): qty 2

## 2023-01-26 MED ORDER — ROSUVASTATIN CALCIUM 20 MG PO TABS
40.0000 mg | ORAL_TABLET | Freq: Every day | ORAL | Status: DC
  Filled 2023-01-26: qty 2

## 2023-01-26 MED ORDER — ACETAMINOPHEN 325 MG PO TABS
650.0000 mg | ORAL_TABLET | Freq: Four times a day (QID) | ORAL | Status: DC | PRN
  Administered 2023-01-26 – 2023-01-27 (×2): 650 mg via ORAL
  Filled 2023-01-26 (×2): qty 2

## 2023-01-26 MED ORDER — LORAZEPAM 2 MG/ML IJ SOLN: 4.0000 mg | INTRAMUSCULAR | Status: DC | PRN

## 2023-01-26 MED ORDER — LUBIPROSTONE 8 MCG PO CAPS
16.0000 ug | ORAL_CAPSULE | Freq: Every day | ORAL | Status: DC
  Filled 2023-01-26: qty 2

## 2023-01-26 MED ORDER — LABETALOL HCL 100 MG PO TABS
50.0000 mg | ORAL_TABLET | Freq: Every day | ORAL | Status: DC
  Administered 2023-01-26 – 2023-01-27 (×2): 50 mg via ORAL
  Filled 2023-01-26 (×3): qty 0.5

## 2023-01-26 MED ORDER — ENOXAPARIN SODIUM 40 MG/0.4ML IJ SOSY
40.0000 mg | PREFILLED_SYRINGE | INTRAMUSCULAR | Status: DC
  Administered 2023-01-26 – 2023-01-28 (×3): 40 mg via SUBCUTANEOUS
  Filled 2023-01-26 (×3): qty 0.4

## 2023-01-26 MED ORDER — QUETIAPINE FUMARATE 25 MG PO TABS
25.0000 mg | ORAL_TABLET | Freq: Every day | ORAL | Status: DC
  Administered 2023-01-26 – 2023-01-27 (×2): 25 mg via ORAL
  Filled 2023-01-26 (×2): qty 1

## 2023-01-26 MED ORDER — NICOTINE 14 MG/24HR TD PT24: 14.0000 mg | MEDICATED_PATCH | Freq: Every day | TRANSDERMAL | Status: DC | PRN

## 2023-01-26 MED ORDER — ORAL CARE MOUTH RINSE
15.0000 mL | OROMUCOSAL | Status: DC
  Administered 2023-01-26 – 2023-01-27 (×8): 15 mL via OROMUCOSAL

## 2023-01-26 MED ORDER — ONDANSETRON HCL 4 MG/2ML IJ SOLN: 4.0000 mg | Freq: Four times a day (QID) | INTRAMUSCULAR | Status: DC | PRN

## 2023-01-26 MED ORDER — ROSUVASTATIN CALCIUM 20 MG PO TABS
40.0000 mg | ORAL_TABLET | Freq: Every day | ORAL | Status: DC
  Administered 2023-01-27: 40 mg via ORAL
  Filled 2023-01-26: qty 2

## 2023-01-26 MED ORDER — CHLORHEXIDINE GLUCONATE 0.12% ORAL RINSE (MEDLINE KIT)
15.0000 mL | Freq: Two times a day (BID) | OROMUCOSAL | Status: DC
  Administered 2023-01-26 – 2023-01-27 (×3): 15 mL via OROMUCOSAL

## 2023-01-26 MED ORDER — OXCARBAZEPINE 150 MG PO TABS
150.0000 mg | ORAL_TABLET | Freq: Three times a day (TID) | ORAL | Status: DC
  Administered 2023-01-26 – 2023-01-27 (×5): 150 mg via ORAL
  Filled 2023-01-26 (×8): qty 1

## 2023-01-26 MED ORDER — ASPIRIN 81 MG PO TBEC
81.0000 mg | DELAYED_RELEASE_TABLET | Freq: Every day | ORAL | Status: DC
  Administered 2023-01-26 – 2023-01-27 (×2): 81 mg via ORAL
  Filled 2023-01-26 (×2): qty 1

## 2023-01-26 MED ORDER — FOLIC ACID 1 MG PO TABS
1.0000 mg | ORAL_TABLET | Freq: Every day | ORAL | Status: DC
  Administered 2023-01-26 – 2023-01-27 (×2): 1 mg via ORAL
  Filled 2023-01-26 (×2): qty 1

## 2023-01-26 MED ORDER — FESOTERODINE FUMARATE ER 4 MG PO TB24
4.0000 mg | ORAL_TABLET | Freq: Every day | ORAL | Status: DC
  Administered 2023-01-26 – 2023-01-27 (×2): 4 mg via ORAL
  Filled 2023-01-26 (×3): qty 1

## 2023-01-26 NOTE — ED Notes (Addendum)
ED TO INPATIENT HANDOFF REPORT  ED Nurse Name and Phone #: [phone]  S Name/Age/Gender Vanessa Short 57 y.o. female Room/Bed: 042C/042C  Code Status   Code Status: Full Code  Home/SNF/Other Home Patient oriented to: self, place, and time Is this baseline? Yes   Triage Complete: Triage complete  Chief Complaint Acute encephalopathy [G93.40]  Triage Note Patient brought in by husband, reports altered mental status worsening today. Husband states she had been in ED for nose infection and hallucinations over the weekend. States she has been staring off into space and some delayed responses. Has been on multiple different abx.   Allergies Allergies  Allergen Reactions   Nitrofurantoin Other (See Comments)    syncope   Oxycodone-Acetaminophen Other (See Comments)    Other   Risperidone And Related     Became very lethargic   Penicillins Rash    Has patient had a PCN reaction causing immediate rash, facial/tongue/throat swelling, SOB or lightheadedness with hypotension: NO Has patient had a PCN reaction causing severe rash involving mucus membranes or skin necrosis: NO Has patient had a PCN reaction that required hospitalization NO Has patient had a PCN reaction occurring within the last 10 years:NO If all of the above answers are "NO", then may proceed with Cephalosporin use.    Level of Care/Admitting Diagnosis ED Disposition     ED Disposition  Admit   Condition  --   Comment  Hospital Area: [facility] [[id number]]  Level of Care: Telemetry Medical [104]  May place patient in observation at [facility] or [facility] if equivalent level of care is available:: No  Covid Evaluation: Asymptomatic - no recent exposure (last 10 days) testing not required  Diagnosis: Acute encephalopathy [829562]  Admitting Physician: Angie Fava [[id number]]  Attending Physician: Angie Fava [[id number]]          B Medical/Surgery History Past  Medical History:  Diagnosis Date   Headache    History of kidney stones    Hypertension    Kidney stones    Movement disorder    Multiple sclerosis (HCC)    Neuropathy    Ulcerative colitis (HCC)    Vision abnormalities    Past Surgical History:  Procedure Laterality Date   ENDOMETRIAL ABLATION  10/10/2017   KIDNEY STONE SURGERY     PORTACATH PLACEMENT N/A 10/16/2017   Procedure: ULTRA SOUND GUIDED INSERTION PORT-A-CATH ERAS PATHWAY;  Surgeon: Rodman Pickle, MD;  Location: [facility];  Service: General;  Laterality: N/A;     A IV Location/Drains/Wounds Patient Lines/Drains/Airways Status     Active Line/Drains/Airways     Name Placement date Placement time Site Days   Implanted Port 10/16/17 Right Chest 10/16/17  1407  Chest  1928   Peripheral IV 01/25/23 20 G Anterior;Distal;Left;Upper Arm 01/25/23  2234  Arm  1   External Urinary Catheter 01/25/23  2349  --  1            Intake/Output Last 24 hours  Intake/Output Summary (Last 24 hours) at 01/26/2023 1012 Last data filed at 01/26/2023 0213 Gross per 24 hour  Intake 1000 ml  Output --  Net 1000 ml    Labs/Imaging Results for orders placed or performed during the hospital encounter of 01/25/23 (from the past 48 hour(s))  CBC with Differential     Status: Abnormal   Collection Time: 01/25/23 10:31 PM  Result Value Ref Range   WBC 12.0 (H) 4.0 - 10.5 K/uL  RBC 4.07 3.87 - 5.11 MIL/uL   Hemoglobin 12.2 12.0 - 15.0 g/dL   HCT 95.2 84.1 - 32.4 %   MCV 97.3 80.0 - 100.0 fL   MCH 30.0 26.0 - 34.0 pg   MCHC 30.8 30.0 - 36.0 g/dL   RDW 40.1 (H) 02.7 - 25.3 %   Platelets 217 150 - 400 K/uL   nRBC 0.0 0.0 - 0.2 %   Neutrophils Relative % 78 %   Neutro Abs 9.4 (H) 1.7 - 7.7 K/uL   Lymphocytes Relative 8 %   Lymphs Abs 1.0 0.7 - 4.0 K/uL   Monocytes Relative 10 %   Monocytes Absolute 1.2 (H) 0.1 - 1.0 K/uL   Eosinophils Relative 3 %   Eosinophils Absolute 0.3 0.0 - 0.5 K/uL   Basophils Relative 1 %    Basophils Absolute 0.1 0.0 - 0.1 K/uL   Immature Granulocytes 0 %   Abs Immature Granulocytes 0.04 0.00 - 0.07 K/uL    Comment: Performed at [facility], [address], [place], [place] [address]  Comprehensive metabolic panel     Status: Abnormal   Collection Time: 01/25/23 10:31 PM  Result Value Ref Range   Sodium 141 135 - 145 mmol/L   Potassium 3.7 3.5 - 5.1 mmol/L   Chloride 107 98 - 111 mmol/L   CO2 25 22 - 32 mmol/L   Glucose, Bld 123 (H) 70 - 99 mg/dL    Comment: Glucose reference range applies only to samples taken after fasting for at least 8 hours.   BUN 31 (H) 6 - 20 mg/dL   Creatinine, Ser 3.47 (H) 0.44 - 1.00 mg/dL   Calcium 8.8 (L) 8.9 - 10.3 mg/dL   Total Protein 6.7 6.5 - 8.1 g/dL   Albumin 3.3 (L) 3.5 - 5.0 g/dL   AST 29 15 - 41 U/L   ALT 40 0 - 44 U/L   Alkaline Phosphatase 107 38 - 126 U/L   Total Bilirubin 0.5 0.3 - 1.2 mg/dL   GFR, Estimated >42 >59 mL/min    Comment: (NOTE) Calculated using the CKD-EPI Creatinine Equation (2021)    Anion gap 9 5 - 15    Comment: Performed at [facility], [address] [place], [place] [address]  Magnesium     Status: None   Collection Time: 01/25/23 10:31 PM  Result Value Ref Range   Magnesium 2.2 1.7 - 2.4 mg/dL    Comment: Performed at [facility], [address] [place], [place] [address]  Urinalysis, Routine w reflex microscopic -Urine, Clean Catch     Status: Abnormal   Collection Time: 01/26/23  3:43 AM  Result Value Ref Range   Color, Urine YELLOW YELLOW   APPearance CLEAR CLEAR   Specific Gravity, Urine 1.015 1.005 - 1.030   pH 6.0 5.0 - 8.0   Glucose, UA NEGATIVE NEGATIVE mg/dL   Hgb urine dipstick SMALL (A) NEGATIVE   Bilirubin Urine NEGATIVE NEGATIVE   Ketones, ur NEGATIVE NEGATIVE mg/dL   Protein, ur NEGATIVE NEGATIVE mg/dL   Nitrite NEGATIVE NEGATIVE   Leukocytes,Ua MODERATE (A) NEGATIVE    Comment: Performed at [facility],  [address], [place], [place] [address]  Urinalysis, Microscopic (reflex)     Status: Abnormal   Collection Time: 01/26/23  3:43 AM  Result Value Ref Range   RBC / HPF 0-5 0 - 5 RBC/hpf   WBC, UA 0-5 0 - 5 WBC/hpf   Bacteria,  UA RARE (A) NONE SEEN   Squamous Epithelial / HPF 0-5 0 - 5 /HPF    Comment: Performed at [facility], [address] [place], [place] [address]  CBC with Differential/Platelet     Status: Abnormal   Collection Time: 01/26/23  4:57 AM  Result Value Ref Range   WBC 10.9 (H) 4.0 - 10.5 K/uL   RBC 3.83 (L) 3.87 - 5.11 MIL/uL   Hemoglobin 11.3 (L) 12.0 - 15.0 g/dL   HCT 37.1 69.6 - 78.9 %   MCV 94.3 80.0 - 100.0 fL   MCH 29.5 26.0 - 34.0 pg   MCHC 31.3 30.0 - 36.0 g/dL   RDW 38.1 (H) 01.7 - 51.0 %   Platelets 205 150 - 400 K/uL   nRBC 0.0 0.0 - 0.2 %   Neutrophils Relative % 75 %   Neutro Abs 8.2 (H) 1.7 - 7.7 K/uL   Lymphocytes Relative 10 %   Lymphs Abs 1.1 0.7 - 4.0 K/uL   Monocytes Relative 11 %   Monocytes Absolute 1.2 (H) 0.1 - 1.0 K/uL   Eosinophils Relative 3 %   Eosinophils Absolute 0.3 0.0 - 0.5 K/uL   Basophils Relative 1 %   Basophils Absolute 0.1 0.0 - 0.1 K/uL   Immature Granulocytes 0 %   Abs Immature Granulocytes 0.04 0.00 - 0.07 K/uL    Comment: Performed at [facility], [address] [place], [place] [address]  Comprehensive metabolic panel     Status: Abnormal   Collection Time: 01/26/23  4:57 AM  Result Value Ref Range   Sodium 140 135 - 145 mmol/L   Potassium 4.4 3.5 - 5.1 mmol/L    Comment: HEMOLYSIS AT THIS LEVEL MAY AFFECT RESULT   Chloride 106 98 - 111 mmol/L   CO2 23 22 - 32 mmol/L   Glucose, Bld 115 (H) 70 - 99 mg/dL    Comment: Glucose reference range applies only to samples taken after fasting for at least 8 hours.   BUN 28 (H) 6 - 20 mg/dL   Creatinine, Ser 7.78 (H) 0.44 - 1.00 mg/dL   Calcium 8.7 (L) 8.9 - 10.3 mg/dL   Total Protein 5.8 (L) 6.5 - 8.1 g/dL   Albumin 2.9 (L) 3.5 - 5.0 g/dL   AST 42 (H) 15  - 41 U/L    Comment: HEMOLYSIS AT THIS LEVEL MAY AFFECT RESULT   ALT 39 0 - 44 U/L    Comment: HEMOLYSIS AT THIS LEVEL MAY AFFECT RESULT   Alkaline Phosphatase 114 38 - 126 U/L   Total Bilirubin 0.9 0.3 - 1.2 mg/dL    Comment: HEMOLYSIS AT THIS LEVEL MAY AFFECT RESULT   GFR, Estimated >60 >60 mL/min    Comment: (NOTE) Calculated using the CKD-EPI Creatinine Equation (2021)    Anion gap 11 5 - 15    Comment: Performed at [facility], [address], [place], [place] [address]  Magnesium     Status: None   Collection Time: 01/26/23  4:57 AM  Result Value Ref Range   Magnesium 2.2 1.7 - 2.4 mg/dL    Comment: Performed at [facility], [address], [place], [place] [address]   DG Chest Portable 1 View  Result Date: 01/25/2023 CLINICAL DATA:  Altered mental status, shortness of breath. EXAM: PORTABLE CHEST 1 VIEW COMPARISON:  June 23, 2018 FINDINGS: There is stable right-sided venous Port-A-Cath positioning. The heart size and mediastinal contours are within normal  limits. There is no evidence of acute infiltrate, pleural effusion or pneumothorax. The visualized skeletal structures are unremarkable. IMPRESSION: No active disease. Electronically Signed   By: Aram Candela M.D.   On: 01/25/2023 21:57    Pending Labs Unresulted Labs (From admission, onward)     Start     Ordered   01/27/23 0500  Basic metabolic panel  Tomorrow morning,   R        01/26/23 0853   01/27/23 0500  CBC  Tomorrow morning,   R        01/26/23 0853   01/25/23 2135  Blood culture (routine x 2)  BLOOD CULTURE X 2,   R,   Status:  Canceled      01/25/23 2134            Vitals/Pain Today's Vitals   01/26/23 0805 01/26/23 0807 01/26/23 0807 01/26/23 0808  BP: (!) 166/75     Pulse: 84     Resp: 13     Temp: 98.1 F (36.7 C)     TempSrc: Oral     SpO2: 100%   100%  Weight:      Height:      PainSc: 0-No pain 0-No pain 0-No pain     Isolation Precautions No active  isolations  Medications Medications  acetaminophen (TYLENOL) tablet 650 mg (has no administration in time range)    Or  acetaminophen (TYLENOL) suppository 650 mg (has no administration in time range)  chlorhexidine gluconate (MEDLINE KIT) (PERIDEX) 0.12 % solution 15 mL (has no administration in time range)  Oral care mouth rinse (has no administration in time range)  LORazepam (ATIVAN) injection 4 mg (has no administration in time range)  enoxaparin (LOVENOX) injection 40 mg (has no administration in time range)  sodium chloride flush (NS) 0.9 % injection 3 mL (has no administration in time range)  nicotine (NICODERM CQ - dosed in mg/24 hours) patch 14 mg (has no administration in time range)  hydrALAZINE (APRESOLINE) injection 5 mg (has no administration in time range)  lactated ringers bolus 1,000 mL (0 mLs Intravenous Stopped 01/26/23 4098)    Mobility non-ambulatory     Focused Assessments Altered mental status   R Recommendations: See Admitting Provider Note  Report given to:   Additional Notes: pt is non-ambulatory at home. Uses a wheelchair and standing lift to use the bathroom. Patient have a broken leg where she fell out of her wheelchair at home. Per her sitter in the room, she not suppose to put pressure on that right leg. NWB. Pt  have MS she does not feed self. She need to be turn, 2 assist. She is on a purwick. Did a bladder scan on her to see is she was retaining urine in her bladder. Bladder scan showed she had 138. Order in her chart if greater than 250 I will let the MD know. She have 150cc in the canister and incontinent void. She is on EEG it stated at 0600 this morning and it suppose to be overnight.

## 2023-01-26 NOTE — Progress Notes (Signed)
LTM maint complete - no skin breakdown under: Fp1 A2 T8 Serviced Cz F7 Fp1 A2 T8 [facility] monitored, Event button test confirmed by [facility].

## 2023-01-26 NOTE — Progress Notes (Signed)
  Carryover admission to the Day Admitter.  I discussed this case with the EDP, Dr. Pilar Plate.  Per these discussions:   This is a 57 year old female with paraplegia, who is being admitted with acute encephalopathy, concern for potential seizures, in the setting of 2 days of intermittent visual hallucinations and concern regarding discordant gaze.  EDP discussed patient's case with on-call neurology, Dr. Amada Jupiter , who recommended [facility] admission for further evaluation of the above.  Dr. Amada Jupiter conveys that neurology will formally consult, and is ordered overnight EEG with video to further evaluate.  I have placed an order for  obs to med-tele for further evaluation management of the above.  I have placed some additional preliminary admit orders via the adult multi-morbid admission order set. I have also ordered n.p.o. status for now, in addition to morning labs in the form of CMP, CBC, and serum magnesium level.    Newton Pigg, DO Hospitalist

## 2023-01-26 NOTE — ED Notes (Signed)
RN reviewed transfer w/ Pt and spouse.  Spouse signed consent to transfer.  Reason for transfer has been amended from MRI to need for an EEG.

## 2023-01-26 NOTE — Procedures (Signed)
Patient Name: Vanessa Short  MRN: [id number]  Epilepsy Attending: Charlsie Quest  Referring Physician/Provider: Rejeana Brock, MD  Duration: 01/26/2023 0500 to 01/27/2023 0500  Patient history: 57 year old female with fairly advanced multiple sclerosis who presents with hallucinations, confusion and today episodes of behavioral arrest with eye deviation. EEG to evaluate for seizure.  Level of alertness: Awake, asleep  AEDs during EEG study: OXC  Technical aspects: This EEG study was done with scalp electrodes positioned according to the 10-20 International system of electrode placement. Electrical activity was reviewed with band pass filter of 1-70Hz , sensitivity of 7 uV/mm, display speed of 70mm/sec with a 60Hz  notched filter applied as appropriate. EEG data were recorded continuously and digitally stored.  Video monitoring was available and reviewed as appropriate.  Description: The posterior dominant rhythm consists of 7 Hz activity of moderate voltage (25-35 uV) seen predominantly in posterior head regions, symmetric and reactive to eye opening and eye closing. Sleep was characterized by sleep spindles (12-14hz ), maximal fronto-central region. EEG showed near continuous generalized polymorphic sharply contoured 5 to 6 Hz theta slowing admixed with intermittent 2-3hz  delta slowing, at times with triphasic morphology. Brief 1-2 seconds of generalized attenuation were also noted. Hyperventilation and photic stimulation were not performed.     ABNORMALITY - Continuous slow, generalized - Background slow  IMPRESSION: This study is suggestive of moderate diffuse encephalopathy, nonspecific etiology. No seizures or definite epileptiform discharges were seen throughout the recording.  Latesia Norrington Annabelle Harman
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
## 2023-01-26 NOTE — Consult Note (Signed)
Neurology Consultation Reason for Consult: Episodes of behavioral arrest Referring Physician: Pilar Plate, M  CC: Episodes of unresponsiveness  History is obtained from: Patient, husband  HPI: Vanessa Short is a 57 y.o. female with a history of multiple sclerosis as well as history of delirium associated with urinary tract infections who presents with confusion, as well as now episodes of behavioral arrest.  Her husband states that he will be talking to her, and she will suddenly stop doing what she is doing, and not respond to them for a second or two.  These are all very brief.  This is never been something she has done before.  She has also been having some hallucinations over the past few days which is not unusual for her to have with urinary tract infections.  Of note, she was recently diagnosed with a an abscess just below her nose and was started on antibiotics including Cipro.  Past Medical History:  Diagnosis Date   Headache    History of kidney stones    Hypertension    Kidney stones    Movement disorder    Multiple sclerosis (HCC)    Neuropathy    Ulcerative colitis (HCC)    Vision abnormalities      Family History  Problem Relation Age of Onset   Dementia Mother    Hypertension Father    Hyperlipidemia Father    Diabetes Father    Heart disease Father    Breast cancer Neg Hx      Social History:  reports that she has never smoked. She has never used smokeless tobacco. She reports current alcohol use. She reports that she does not use drugs.   Exam: Current vital signs: BP (!) 166/84   Pulse 90   Temp 98.1 F (36.7 C) (Oral)   Resp 16   Ht 5\' 3"  (1.6 m)   Wt 81.6 kg   SpO2 93%   BMI 31.89 kg/m  Vital signs in last 24 hours: Temp:  [97.9 F (36.6 C)-98.1 F (36.7 C)] 98.1 F (36.7 C) (06/14 0213) Pulse Rate:  [84-90] 90 (06/14 0330) Resp:  [16-18] 16 (06/14 0336) BP: (131-168)/(70-95) 166/84 (06/14 0330) SpO2:  [93 %-100 %] 93 % (06/14 0330) Weight:   [81.6 kg] 81.6 kg (06/13 2054)   Physical Exam  Appears well-developed and well-nourished.   Neuro: Mental Status: Patient is awake, alert, oriented to person, place, month, year, and situation. Patient is able to give a clear and coherent history. No signs of aphasia or neglect Cranial Nerves: II: Visual Fields are full. Pupils are equal, round, and reactive to light.   III,IV, VI: EOMI without ptosis or diploplia.  V: Facial sensation is symmetric to temperature VII: Facial movement is symmetric.  VIII: hearing is intact to voice X: Uvula elevates symmetrically XI: Shoulder shrug is symmetric. XII: tongue is midline without atrophy or fasciculations.  Motor: She has increased tone in all extremities, she is able to lift her right arm against gravity and has some movement in her right hand though it is weak.  No movement in her left upper extremity, she is able to wiggle her right toes very minimally.  No movement in the left lower extremity Sensory: Sensation is symmetric to light touch in the arms and legs. Plantars: Toes are upgoing bilaterally.  Cerebellar: Unable to perform  While I was in the room, she looked to the right and did not answer me for a second or two, but then came right  to with no postictal state.    I have reviewed labs in epic and the results pertinent to this consultation are: Creatinine 1.05 WBC 12  I have reviewed the images obtained: MRI brain from 2022, extensive white matter disease consistent with MS  Impression: 57 year old female with fairly advanced multiple sclerosis who presents with hallucinations, confusion and today episodes of behavioral arrest with eye deviation.  These episodes are very unusual, and I would favor characterizing them further with EEG.  She was recently started on Cipro which does lower seizure threshold.  She is already on Trileptal for dysesthesias, and if these are seizures, I would favor increasing  this.  Recommendations: 1) discontinue Cipro, would favor not fluoroquinolone agent 2) continue Trileptal 150 3 times daily 3) prolonged EEG to assess for seizures 4) neurology will follow  Ritta Slot, MD [facility] [phone]  If 7pm- 7am, please page neurology on call as listed in AMION.

## 2023-01-26 NOTE — H&P (Addendum)
History and Physical    [name] ZOX:[id number] DOB: Aug 10, 1966 DOA: 01/25/2023 DOS: the patient was seen and examined on 01/26/2023 PCP: Richmond Campbell., [name]  Patient coming from: Home - lives with husband and caregivers; NOK:  [place], [place] [phone])   Chief Complaint: AMS  HPI: Vanessa Short is a 57 y.o. female with medical history significant of HTN, MS, and UC presenting with AMS, concern for seizures with 2 days of intermittent visual hallucinations and discordant gaze.   She was seen on 6/8 for an abscess on her R nares, MRSA positive, given Flagyl, Doxy, Cefdinir.  She returned on 6/10 with hallucinations, Flagyl stopped.  UA by PCP on 6/14 with moderate Hgb and small LE, no apparent culture pending.  Husband and caregiver report that she had a nasty fall with R lower leg fractures in December and has been immobile since.  They are still using a Hoyer lift but are hoping to start using a lift chair soon.  She has periodically had mild hallucinations (usually associated with UTI) but over the last 3 days has been having marked hallucinations with frank conversations with imagined people.  The nares infection is markedly improved but the psychosis was worse than ever with ?horizontal nystagmus so they decided to bring her in.    ER Course:  Carryover, per Dr. Arlean Hopping:  Acute encephalopathy, concern for potential seizures, in the setting of 2 days of intermittent visual hallucinations and concern regarding discordant gaze.  Dr. Amada Jupiter will consult, overnight EEG with video ordered.     Review of Systems: As mentioned in the history of present illness. All other systems reviewed and are negative. Past Medical History:  Diagnosis Date   Headache    History of kidney stones    Hypertension    Kidney stones    Movement disorder    Multiple sclerosis (HCC)    Neuropathy    Ulcerative colitis (HCC)    Vision abnormalities    Past Surgical  History:  Procedure Laterality Date   ENDOMETRIAL ABLATION  10/10/2017   KIDNEY STONE SURGERY     PORTACATH PLACEMENT N/A 10/16/2017   Procedure: ULTRA SOUND GUIDED INSERTION PORT-A-CATH ERAS PATHWAY;  Surgeon: Rodman Pickle, MD;  Location: [facility];  Service: General;  Laterality: N/A;   Social History:  reports that she has never smoked. She has never used smokeless tobacco. She reports current alcohol use. She reports that she does not use drugs.  Allergies  Allergen Reactions   Nitrofurantoin Other (See Comments)    syncope   Oxycodone-Acetaminophen Other (See Comments)    Other   Risperidone And Related     Became very lethargic   Penicillins Rash    Has patient had a PCN reaction causing immediate rash, facial/tongue/throat swelling, SOB or lightheadedness with hypotension: NO Has patient had a PCN reaction causing severe rash involving mucus membranes or skin necrosis: NO Has patient had a PCN reaction that required hospitalization NO Has patient had a PCN reaction occurring within the last 10 years:NO If all of the above answers are "NO", then may proceed with Cephalosporin use.    Family History  Problem Relation Age of Onset   Dementia Mother    Hypertension Father    Hyperlipidemia Father    Diabetes Father    Heart disease Father    Breast cancer Neg Hx     Prior to Admission medications   Medication Sig Start Date End Date Taking? Authorizing Provider  acetaminophen (TYLENOL) 500 MG tablet Take 1,000 mg by mouth at bedtime.    [provider]  acyclovir (ZOVIRAX) 400 MG tablet Take 400 mg by mouth daily. Take every day per patient 08/11/14   [provider]  AMITIZA 8 MCG capsule Take 8 mcg by mouth 3 (three) times daily. GI MD prescribing- will take 2 caps BID prn 06/11/18   [provider]  aspirin EC 81 MG tablet Take 81 mg by mouth daily.    [provider]  b complex vitamins tablet Take 1 tablet by mouth daily.      [provider]  baclofen (LIORESAL) 20 MG tablet One po qid 01/09/23   Sater, Pearletha Furl, MD  cefdinir (OMNICEF) 300 MG capsule Take 1 capsule (300 mg total) by mouth 2 (two) times daily. 01/21/23   Small, Brooke L, PA  cholecalciferol (VITAMIN D) 1000 UNITS tablet Take 1,000 Units by mouth daily.     [provider]  ciprofloxacin (CIPRO) 250 MG tablet Take 250 mg by mouth 2 (two) times daily.    [provider]  dantrolene (DANTRIUM) 50 MG capsule Take 1 capsule (50 mg total) by mouth in the morning, at noon, in the evening, and at bedtime. 12/04/22   Sater, Pearletha Furl, MD  diazepam (VALIUM) 5 MG tablet One po qHS 10/17/22   Sater, Pearletha Furl, MD  doxycycline (VIBRAMYCIN) 100 MG capsule Take 1 capsule (100 mg total) by mouth 2 (two) times daily. 01/21/23   Small, Brooke L, PA  famotidine (PEPCID) 20 MG tablet Take 40 mg by mouth 2 (two) times daily.    [provider]  FLUoxetine (PROZAC) 10 MG capsule Take 5 capsules (50 mg total) by mouth daily. 08/01/18   Howard Pouch, MD  folic acid (FOLVITE) 400 MCG tablet Take 800 mcg by mouth daily.    [provider]  furosemide (LASIX) 40 MG tablet Take 1 tablet (40 mg total) by mouth 2 (two) times daily. 08/19/16   Rai, Delene Ruffini, MD  labetalol (NORMODYNE) 100 MG tablet TAKE 1/2 TABLET BY MOUTH DAILY 01/10/23   Sater, Pearletha Furl, MD  lidocaine-prilocaine (EMLA) cream Apply one inch before port-a cath access prn 03/23/21   Sater, Pearletha Furl, MD  metroNIDAZOLE (FLAGYL) 500 MG tablet Take 1 tablet (500 mg total) by mouth 2 (two) times daily. 01/21/23   Small, Brooke L, PA  Multiple Vitamin (MULTIVITAMIN) tablet Take 1 tablet by mouth daily.    [provider]  OXcarbazepine (TRILEPTAL) 150 MG tablet Take 1 tablet (150 mg total) by mouth 3 (three) times daily. 12/04/22   Sater, Pearletha Furl, MD  potassium chloride (KLOR-CON) 10 MEQ tablet Take 10 mEq by mouth daily. 08/13/21   [provider]  potassium chloride SA  (KLOR-CON M) 20 MEQ tablet Take 1 tablet (20 mEq total) by mouth once for 1 dose. 01/22/23 01/22/23  Elson Areas, [name]  QUEtiapine (SEROQUEL) 25 MG tablet Take 1 tablet (25 mg total) by mouth at bedtime. 07/31/18   Howard Pouch, MD  rosuvastatin (CRESTOR) 40 MG tablet Take 40 mg by mouth daily.    [provider]  sodium chloride (OCEAN) 0.65 % SOLN nasal spray Place 1 spray into both nostrils as needed for congestion. 08/19/16   Rai, Ripudeep Kirtland Bouchard, MD  Teriflunomide 14 MG TABS Take 1 tablet (14 mg total) by mouth daily. 12/06/22   Sater, Pearletha Furl, MD  Teriflunomide 14 MG TABS Take 1 tablet (14 mg total) by  mouth daily. 12/13/22   Sater, Pearletha Furl, MD  tolterodine (DETROL) 2 MG tablet Take 2 mg by mouth 2 (two) times daily.  05/09/18   [provider]    Physical Exam: Vitals:   01/26/23 0700 01/26/23 0715 01/26/23 0805 01/26/23 0808  BP: (!) 159/82  (!) 166/75   Pulse: 84 83 84   Resp:   13   Temp:   98.1 F (36.7 C)   TempSrc:   Oral   SpO2: 97% 95% 100% 100%  Weight:      Height:       General:  Appears calm and comfortable and is in NAD, EEG in place Eyes:  EOMI, normal lids, iris, no obvious nystagmus ENT:  grossly normal hearing, lips & tongue, mmm Neck:  no LAD, masses or thyromegaly Cardiovascular:  RRR, no m/r/g. No LE edema.  Respiratory:   CTA bilaterally with no wheezes/rales/rhonchi.  Normal respiratory effort. Abdomen:  soft, NT, ND Skin:  no rash or induration seen on limited exam Musculoskeletal:  atrophic tone BUE < BLE, no bony abnormality, cannot wiggle toes (baseline, per husband) Psychiatric:  blunted mood and affect, speech fluent and appropriate, AOx2, not responding to internal stimuli Neurologic:  CN 2-12 grossly intact   Radiological Exams on Admission: Independently reviewed - see discussion in A/P where applicable  DG Chest Portable 1 View  Result Date: 01/25/2023 CLINICAL DATA:  Altered mental status, shortness of breath. EXAM: PORTABLE  CHEST 1 VIEW COMPARISON:  June 23, 2018 FINDINGS: There is stable right-sided venous Port-A-Cath positioning. The heart size and mediastinal contours are within normal limits. There is no evidence of acute infiltrate, pleural effusion or pneumothorax. The visualized skeletal structures are unremarkable. IMPRESSION: No active disease. Electronically Signed   By: Aram Candela M.D.   On: 01/25/2023 21:57    EKG: none   Labs on Admission: I have personally reviewed the available labs and imaging studies at the time of the admission.  Pertinent labs:    Glucose 115 BUN 28/Creatinine 1.07/GFR >60 - stable Albumin 2.9 AST 42/ALT 39 WBC 10.9 Hgb 11.3 UA: small Hgb, moderate LE   Assessment and Plan: Active Problems:   Multiple sclerosis (HCC)   Ulcerative colitis (HCC)   Hypertension   Delirium due to another medical condition, acute, hyperactive   MRSA (methicillin resistant Staphylococcus aureus) infection    Delirium -Patient presenting with encephalopathy as evidenced by her psychosis -She has had h/o periodic mild delirium and known MS but no frank cognitive dysfunction at baseline -Evaluation thus far unremarkable -She was recently suspected of having UTI, although UA on 6/14 and today were unremarkable - Cipro discontinued -There is concern for seizure activity; undergoing continuous EEG -Neuro has consulted, recommends Trileptal -Delirium precautions -Based on unremarkable evaluation with current ability to protect her airway, will observe for now with telemetry monitoring -50% of patients with delirium while hospitalized will be institutionalized at 6 months, and these patients have a 25% mortality at 6 months -The family would benefit from being referred to the [facility] and also provided with the [employer] website  -Will request psychiatry consult - she is already on fluoxetine and Seroquel; progressive primary psychiatric disorder is also a  consideration, particularly in light of MS  MRSA Nares Abscess -Seen in the ER on 6/8 for this issue -Pictures reviewed in Epic and on husband's phone   -Clearly improved at this time -Continue mupirocin, doxy -Based on improving infection but worsening psychosis, this  seems unlikely to be related to current presentation  MS with ambulatory dysfunction -Mostly bedbound, trying to get more mobile -Continue home meds - acyclovir, baclofen, dantrolene, diazepam, Trileptal, teriflunomide -PT/OT consults  UC -Diagnosed in 2012 but normal colonoscopy in 2016 so diagnosis is currently suspect -Her only current medications are famotidine and Amitiza  HTN -Continue labetalol    Advance Care Planning:   Code Status: Full Code - Code status was discussed with the patient and/or family at the time of admission.  The patient would want to receive full resuscitative measures at this time.   Consults: Neurology; Psychiatry; PT/OT  DVT Prophylaxis: Lovenox  Family Communication: Husband and caregiver were present throughout evaluation  Severity of Illness: The appropriate patient status for this patient is OBSERVATION. Observation status is judged to be reasonable and necessary in order to provide the required intensity of service to ensure the patient's safety. The patient's presenting symptoms, physical exam findings, and initial radiographic and laboratory data in the context of their medical condition is felt to place them at decreased risk for further clinical deterioration. Furthermore, it is anticipated that the patient will be medically stable for discharge from the hospital within 2 midnights of admission.   Author: Jonah Blue, MD 01/26/2023 11:07 AM  For on call review [email or web address].

## 2023-01-26 NOTE — Progress Notes (Signed)
IV Team to bedside per consult for port-a-cath access. Patient refused port access at this time, wants to wait until absolutely necessary. Pt with working PIV. Port access requested for lab work. Labs ordered for 6/15 @ 0500 so will wait until closer to then per patient's request. Pt also asking for EMLA cream prior to port access, spoke with floor nurse Jasmine to obtain order.

## 2023-01-26 NOTE — Plan of Care (Signed)

## 2023-01-26 NOTE — ED Notes (Signed)
[facility] called to request transport to [facility].  Charge RN secure chatted to inform of transfer.

## 2023-01-26 NOTE — Progress Notes (Signed)
Pt arrived to [facility] [place] from the ED at approximately 1104, via stretcher. Pt is alert and oriented x4, is on room air, and denied pain. Pt is wheelchair/bedbound at baseline, and a dependent feeder. Pt has a caregiver that will work with pt from 7:30 am-5:30 pm; 7:30 pm-11:00 pm; and pt's husband will stay overnight after 11:00 pm. Pt in NAD at this time.
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
## 2023-01-26 NOTE — ED Provider Notes (Signed)
  Provider Note MRN:  [id number]  Arrival date & time: 01/26/23    ED Course and Medical Decision Making  Assumed care from Dr. Doran Durand upon patient transfer  Altered mental status, reportedly sedations, also episodes of behavioral arrest with eye deviation/gaze.  Evaluated by Dr. Amada Jupiter, neurology, plan is for admission, EEG.  Will reach out to internal medicine.  Procedures  Final Clinical Impressions(s) / ED Diagnoses     ICD-10-CM   1. Altered mental status, unspecified altered mental status type  R41.82       ED Discharge Orders     None       Discharge Instructions   None     Elmer Sow. Pilar Plate, MD [facility] Emergency Medicine [facility] [email or web address]    Sabas Sous, MD 01/26/23 [id number]

## 2023-01-26 NOTE — Progress Notes (Signed)
Moved patient from ED 42 to 5W11. [facility] now monitoring. Test button was tested.

## 2023-01-26 NOTE — Progress Notes (Signed)
LTM EEG hooked up and running - no initial skin breakdown , tender at O2 - push button tested - not monitored by [facility] due to being in ED.

## 2023-01-27 DIAGNOSIS — A4902 Methicillin resistant Staphylococcus aureus infection, unspecified site: Secondary | ICD-10-CM

## 2023-01-27 DIAGNOSIS — R569 Unspecified convulsions: Secondary | ICD-10-CM | POA: Diagnosis not present

## 2023-01-27 DIAGNOSIS — R4182 Altered mental status, unspecified: Secondary | ICD-10-CM | POA: Diagnosis not present

## 2023-01-27 LAB — BASIC METABOLIC PANEL
Anion gap: 12 (ref 5–15)
BUN: 33 mg/dL — ABNORMAL HIGH (ref 6–20)
CO2: 23 mmol/L (ref 22–32)
Calcium: 8.9 mg/dL (ref 8.9–10.3)
Chloride: 102 mmol/L (ref 98–111)
Creatinine, Ser: 1.12 mg/dL — ABNORMAL HIGH (ref 0.44–1.00)
GFR, Estimated: 57 mL/min — ABNORMAL LOW (ref >60)
Glucose, Bld: 101 mg/dL — ABNORMAL HIGH (ref 70–99)
Potassium: 3.3 mmol/L — ABNORMAL LOW (ref 3.5–5.1)
Sodium: 137 mmol/L (ref 135–145)

## 2023-01-27 LAB — CBC
HCT: 34.7 % — ABNORMAL LOW (ref 36.0–46.0)
Hemoglobin: 10.8 g/dL — ABNORMAL LOW (ref 12.0–15.0)
MCH: 29.3 pg (ref 26.0–34.0)
MCHC: 31.1 g/dL (ref 30.0–36.0)
MCV: 94 fL (ref 80.0–100.0)
Platelets: 153 10*3/uL (ref 150–400)
RBC: 3.69 MIL/uL — ABNORMAL LOW (ref 3.87–5.11)
RDW: 15.9 % — ABNORMAL HIGH (ref 11.5–15.5)
WBC: 7.6 10*3/uL (ref 4.0–10.5)
nRBC: 0 % (ref 0.0–0.2)

## 2023-01-27 MED ORDER — SODIUM CHLORIDE 0.9% FLUSH
10.0000 mL | Freq: Two times a day (BID) | INTRAVENOUS | Status: DC
  Administered 2023-01-27 – 2023-01-28 (×3): 10 mL

## 2023-01-27 MED ORDER — MESALAMINE ER 0.375 G PO CP24
0.3750 g | ORAL_CAPSULE | Freq: Four times a day (QID) | ORAL | Status: DC
  Administered 2023-01-27 (×3): 0.375 g via ORAL
  Filled 2023-01-27: qty 1

## 2023-01-27 MED ORDER — POTASSIUM CHLORIDE CRYS ER 20 MEQ PO TBCR
40.0000 meq | EXTENDED_RELEASE_TABLET | Freq: Once | ORAL | Status: AC
  Administered 2023-01-27: 40 meq via ORAL
  Filled 2023-01-27: qty 2

## 2023-01-27 MED ORDER — POTASSIUM CHLORIDE CRYS ER 20 MEQ PO TBCR: 20.0000 meq | EXTENDED_RELEASE_TABLET | Freq: Every day | ORAL | Status: DC

## 2023-01-27 MED ORDER — SODIUM CHLORIDE 0.9% FLUSH: 10.0000 mL | INTRAVENOUS | Status: DC | PRN

## 2023-01-27 MED ORDER — CHLORHEXIDINE GLUCONATE CLOTH 2 % EX PADS
6.0000 | MEDICATED_PAD | Freq: Every day | CUTANEOUS | Status: DC
  Administered 2023-01-27: 6 via TOPICAL

## 2023-01-27 MED ORDER — MESALAMINE ER 0.375 G PO CP24: 0.3750 g | ORAL_CAPSULE | Freq: Four times a day (QID) | ORAL | Status: DC

## 2023-01-27 NOTE — TOC Initial Note (Signed)
Transition of Care [facility]) - Initial/Assessment Note    Patient Details  Name: Vanessa Short MRN: [id number] Date of Birth: 1966/01/30  Transition of Care [facility]) CM/SW Contact:    Lawerance Sabal, RN Phone Number: 01/27/2023, 12:38 PM  Clinical Narrative:                  Sherron Monday w patient, spouse and home health aid at bedside.  Patient has HHA from 8:30 to 11pm every day.  Patient has all needed DME at home per spouse, 2 electric WCs, 2 sit to stands, a hoyer, shower chair and flat shower entrance, prefers her own bed at home.  We discussed additional support at home and they would like to set up their outpatient services with the ortho doctor after discharge. Declined offer for assistance and home health.   Expected Discharge Plan: Home/Self Care Barriers to Discharge: Continued Medical Work up   Patient Goals and CMS Choice Patient states their goals for this hospitalization and ongoing recovery are:: to return home CMS Medicare.gov Compare Post Acute Care list provided to:: Patient Choice offered to / list presented to : Patient      Expected Discharge Plan and Services In-house Referral: NA Discharge Planning Services: CM Consult   Living arrangements for the past 2 months: Single Family Home                 DME Arranged: N/A         HH Arranged: NA          Prior Living Arrangements/Services Living arrangements for the past 2 months: Single Family Home Lives with:: Spouse              Current home services: Horticulturist, commercial, DME    Activities of Daily Living Home Assistive Devices/Equipment: Wheelchair, Shower chair with back, Blood pressure cuff, Nurse, adult, Other (Comment) (Sit to stand) ADL Screening (condition at time of admission) Patient's cognitive ability adequate to safely complete daily activities?: Yes Is the patient deaf or have difficulty hearing?: No Does the patient have difficulty seeing, even when wearing glasses/contacts?: No Does the  patient have difficulty concentrating, remembering, or making decisions?: No Patient able to express need for assistance with ADLs?: Yes Does the patient have difficulty dressing or bathing?: Yes Independently performs ADLs?: No Communication: Independent Dressing (OT): Needs assistance Is this a change from baseline?: Pre-admission baseline Grooming: Dependent Is this a change from baseline?: Pre-admission baseline Feeding: Dependent Is this a change from baseline?: Pre-admission baseline Bathing: Dependent Is this a change from baseline?: Pre-admission baseline Toileting: Dependent Is this a change from baseline?: Pre-admission baseline In/Out Bed: Dependent Is this a change from baseline?: Pre-admission baseline Walks in Home: Dependent Is this a change from baseline?: Pre-admission baseline Does the patient have difficulty walking or climbing stairs?: Yes Weakness of Legs: Both Weakness of Arms/Hands: Both  Permission Sought/Granted                  Emotional Assessment              Admission diagnosis:  Acute encephalopathy [G93.40] Altered mental status, unspecified altered mental status type [R41.82] Patient Active Problem List   Diagnosis Date Noted   Delirium due to another medical condition, acute, hyperactive 01/26/2023   MRSA (methicillin resistant Staphylococcus aureus) infection 01/26/2023   Urinary incontinence 05/09/2021   Bedbound 10/27/2020   Lymphopenia 10/27/2020   Port-A-Cath in place 12/22/2019   Left arm weakness 12/16/2018   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    Class: History of   Auditory hallucinations 07/30/2018   Tactile hallucinations 07/30/2018   Hallucinations 07/30/2018   Altered mental status 07/29/2018   Wheelchair bound 02/27/2017   Chronic diastolic CHF (congestive heart failure) (HCC) 09/30/2016   Prolonged QT interval    High risk medication use 09/27/2016   Normocytic  anemia 08/05/2016   Slurred speech 08/05/2016   Ulcerative colitis (HCC) 08/05/2016   Hypertension 08/05/2016   Subacromial bursitis 05/09/2016   Hallucination, visual 03/14/2016    Class: History of   Urinary disorder 11/09/2015   Hand weakness 01/06/2015   Multiple sclerosis (HCC) 09/07/2014   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 [place] [address] Phone: [phone] Fax: [phone]  [id number] [place], [place] - [address] AT [facility] [address] [place] [address] Phone: [phone] Fax: [phone]  [id number] - [place], [place] - [address] [facility] [address] [place] [address] Phone: [phone] Fax: [phone]  [facility] [place] - [place], [place] - [address] [place] [address] Phone: [phone] Fax: [phone]  [facility] - [place], [place] - [address] [place] [address] Phone: [phone] Fax: [phone]  [facility] [address] [place] - [address] [place] [address] Phone: [phone] Fax: [phone]     Social Determinants of Health (SDOH) Social History: SDOH Screenings   Food Insecurity: No Food Insecurity (01/26/2023)  Housing: Low Risk  (01/26/2023)  Transportation Needs: No Transportation Needs (01/26/2023)  Utilities: Not At Risk (01/26/2023)  Tobacco Use: Low Risk  (01/26/2023)   SDOH Interventions:     Readmission Risk Interventions     No data to display

## 2023-01-27 NOTE — Care Management Obs Status (Signed)
MEDICARE OBSERVATION STATUS NOTIFICATION   Patient Details  Name: Vanessa Short MRN: [id number] Date of Birth: Jul 05, 1966   Medicare Observation Status Notification Given:  Yes    Ronny Bacon, RN 01/27/2023, 4:17 PM

## 2023-01-27 NOTE — Procedures (Addendum)
Patient Name: RUHAMA GAWRON  MRN: [id number]  Epilepsy Attending: Charlsie Quest  Referring Physician/Provider: Rejeana Brock, MD  Duration: 01/27/2023 0500 to 01/27/2023 1400   Patient history: 57 year old female with fairly advanced multiple sclerosis who presents with hallucinations, confusion and today episodes of behavioral arrest with eye deviation. EEG to evaluate for seizure.   Level of alertness: Awake, asleep   AEDs during EEG study: OXC   Technical aspects: This EEG study was done with scalp electrodes positioned according to the 10-20 International system of electrode placement. Electrical activity was reviewed with band pass filter of 1-70Hz , sensitivity of 7 uV/mm, display speed of 39mm/sec with a 60Hz  notched filter applied as appropriate. EEG data were recorded continuously and digitally stored.  Video monitoring was available and reviewed as appropriate.   Description: The posterior dominant rhythm consists of 8-9 Hz activity of moderate voltage (25-35 uV) seen predominantly in posterior head regions, symmetric and reactive to eye opening and eye closing. Sleep was characterized by sleep spindles (12-14hz ), maximal fronto-central region. EEG showed intermittent generalized polymorphic sharply contoured 5 to 6 Hz theta slowing admixed with intermittent 2-3hz  delta slowing, at times with triphasic morphology. Hyperventilation and photic stimulation were not performed.      ABNORMALITY - Intermittent slow, generalized   IMPRESSION: This study is suggestive of mild diffuse encephalopathy, nonspecific etiology. No seizures or definite epileptiform discharges were seen throughout the recording.   Quinlyn Tep Annabelle Harman

## 2023-01-27 NOTE — Progress Notes (Addendum)
LTM EEG discontinued - no skin breakdown at [facility]. Mb placed a LTM Set up charge that was not originally placed after hook up

## 2023-01-27 NOTE — Progress Notes (Signed)
PROGRESS NOTE                                                                                                                                                                                                             Patient Demographics:    Vanessa Short, is a 57 y.o. female, DOB - 01/17/1966, ZOX:[id number]  Outpatient Primary MD for the patient is Richmond Campbell., [name]    LOS - 0  Admit date - 01/25/2023    Chief Complaint  Patient presents with   Altered Mental Status       Brief Narrative (HPI from H&P)   57 y.o. female with medical history significant of HTN, MS, and UC presenting with AMS, concern for seizures with 2 days of intermittent visual hallucinations and discordant gaze.   She was seen on 6/8 for an abscess on her R nares, MRSA positive, given Flagyl, Doxy, Cefdinir.  She returned on 6/10 with hallucinations, Flagyl stopped.  UA by PCP on 6/14 with moderate Hgb and small LE, no apparent culture pending.    Husband and caregiver report that she had a nasty fall with R lower leg fractures in December and has been immobile since.  They are still using a Hoyer lift but are hoping to start using a lift chair soon.  She has periodically had mild hallucinations (usually associated with UTI) but over the last 3 days has been having marked hallucinations with frank conversations with imagined people.  The nares infection is markedly improved but the psychosis was worse than ever with ?horizontal nystagmus so they decided to bring her in, she was admitted for encephalopathy possible seizure-like activity.   Subjective:    Vanessa Short today has, No headache, No chest pain, No abdominal pain - No Nausea, No new weakness tingling or numbness, no SOB.   Assessment  & Plan :    Delirium/toxic encephalopathy -Patient presenting with encephalopathy as evidenced by her psychosis, she has had h/o periodic mild delirium  and known MS but no frank cognitive dysfunction at baseline, encephalopathy could have been due to recent right nasal infection or interaction with one of the antibiotics that she was given.  No focal deficits or headache, seen by neurology and psych.  Overnight EEG nonacute.  Mentation much improved, continue on present oral doxycycline and mupirocin cream for her  nasal infection, UA stable.  If continues to be improving likely discharge on 01/28/2019 for home.   MRSA right nasal nare infection -Could have been a pustule which has self drained, much improved now on mupirocin cream along with doxycycline, skin culture positive for MRSA but could be just MRSA colonization.  Post discharge follow-up with PCP and dermatology x 1.  Low likelihood of basal cell CA.  MS with ambulatory dysfunction, at baseline wheelchair and bedbound with functional paraplegia and minimal upper extremity strength -Mostly bedbound, trying to get more mobile -Continue home meds - acyclovir, baclofen, dantrolene, diazepam, Trileptal, teriflunomide -PT/OT/speech consults   UC -Diagnosed in 2012 but normal colonoscopy in 2016 so diagnosis is currently suspect -Her only current medications are famotidine and Amitiza   HTN -Continue labetalol         Condition - Extremely Guarded  Family Communication  :  husband bedside 01/27/23  Code Status :  Full  Consults  :  Neuro, Psych  PUD Prophylaxis :    Procedures  :     EEG overnight -  non specific encephalopathy      Disposition Plan  :    Status is: Observation  DVT Prophylaxis  :    enoxaparin (LOVENOX) injection 40 mg Start: 01/26/23 0900     Lab Results  Component Value Date   PLT 153 01/27/2023    Diet :  Diet Order             Diet regular Room service appropriate? Yes; Fluid consistency: Thin  Diet effective now                    Inpatient Medications  Scheduled Meds:  acyclovir  400 mg Oral Daily   aspirin EC  81 mg Oral  Daily   baclofen  20 mg Oral QID   chlorhexidine gluconate (MEDLINE KIT)  15 mL Mouth Rinse BID   Chlorhexidine Gluconate Cloth  6 each Topical Daily   dantrolene  50 mg Oral QID   doxycycline  100 mg Oral BID   enoxaparin (LOVENOX) injection  40 mg Subcutaneous Q24H   famotidine  40 mg Oral BID   fesoterodine  4 mg Oral Daily   FLUoxetine  20 mg Oral TID WC   folic acid  1 mg Oral Daily   labetalol  50 mg Oral Daily   lubiprostone  8 mcg Oral Q breakfast   And   lubiprostone  16 mcg Oral Q24H   mesalamine  1.5 g Oral Daily   mupirocin ointment   Nasal BID   mouth rinse  15 mL Mouth Rinse 10 times per day   OXcarbazepine  150 mg Oral TID   QUEtiapine  25 mg Oral QHS   rosuvastatin  40 mg Oral QHS   sodium chloride flush  10-40 mL Intracatheter Q12H   Teriflunomide  1 tablet Oral Daily   Continuous Infusions: PRN Meds:.acetaminophen **OR** acetaminophen, diazepam, hydrALAZINE, LORazepam       Objective:   Vitals:   01/26/23 2000 01/26/23 2323 01/27/23 0306 01/27/23 0827  BP: 136/69 127/76 135/67 134/74  Pulse: 82 86 81   Resp: 14 18 14    Temp: 98 F (36.7 C) 98.1 F (36.7 C) 98 F (36.7 C) 97.7 F (36.5 C)  TempSrc: Oral Oral Oral Oral  SpO2: 94% 96% 92%   Weight:      Height:        Wt Readings from Last 3 Encounters:  01/25/23 81.6 kg  01/22/23 81.6 kg  07/31/22 79.4 kg     Intake/Output Summary (Last 24 hours) at 01/27/2023 1026 Last data filed at 01/27/2023 0700 Gross per 24 hour  Intake --  Output 700 ml  Net -700 ml     Physical Exam  Awake Alert, functional paraplegia with 4/5 strength in upper extremities all from underlying MS, chronically bed and wheelchair-bound, [place].AT,PERRAL, right nare soft tissue infection site much improved Supple Neck, No JVD,   Symmetrical Chest wall movement, Good air movement bilaterally, CTAB RRR,No Gallops,Rubs or new Murmurs,  +ve B.Sounds, Abd Soft, No tenderness,   No Cyanosis, Clubbing or edema     Data  Review:    Recent Labs  Lab 01/20/23 1623 01/22/23 1612 01/25/23 2231 01/26/23 0457 01/27/23 0347  WBC 12.7* 10.3 12.0* 10.9* 7.6  HGB 11.8* 11.5* 12.2 11.3* 10.8*  HCT 36.8 36.9 39.6 36.1 34.7*  PLT 187 206 217 205 153  MCV 96.1 93.4 97.3 94.3 94.0  MCH 30.8 29.1 30.0 29.5 29.3  MCHC 32.1 31.2 30.8 31.3 31.1  RDW 15.1 14.9 16.0* 15.9* 15.9*  LYMPHSABS  --  0.6* 1.0 1.1  --   MONOABS  --  1.2* 1.2* 1.2*  --   EOSABS  --  0.3 0.3 0.3  --   BASOSABS  --  0.1 0.1 0.1  --     Recent Labs  Lab 01/20/23 1623 01/22/23 1612 01/25/23 2231 01/26/23 0457 01/27/23 0347  NA 138 140 141 140 137  K 3.4* 3.0* 3.7 4.4 3.3*  CL 102 104 107 106 102  CO2 22 22 25 23 23   ANIONGAP 14 14 9 11 12   GLUCOSE 120* 155* 123* 115* 101*  BUN 19 19 31* 28* 33*  CREATININE 1.09* 1.05* 1.07* 1.07* 1.12*  AST  --  25 29 42*  --   ALT  --  35 40 39  --   ALKPHOS  --  110 107 114  --   BILITOT  --  0.5 0.5 0.9  --   ALBUMIN  --  3.1* 3.3* 2.9*  --   LATICACIDVEN 1.3  --   --   --   --   MG  --   --  2.2 2.2  --   CALCIUM 9.0 9.0 8.8* 8.7* 8.9      Recent Labs  Lab 01/20/23 1623 01/22/23 1612 01/25/23 2231 01/26/23 0457 01/27/23 0347  LATICACIDVEN 1.3  --   --   --   --   MG  --   --  2.2 2.2  --   CALCIUM 9.0 9.0 8.8* 8.7* 8.9    Recent Labs  Lab 01/20/23 1623 01/22/23 1612 01/25/23 2231 01/26/23 0457 01/27/23 0347  WBC 12.7* 10.3 12.0* 10.9* 7.6  PLT 187 206 217 205 153  LATICACIDVEN 1.3  --   --   --   --   CREATININE 1.09* 1.05* 1.07* 1.07* 1.12*    ------------------------------------------------------------------------------------------------------------------ Lab Results  Component Value Date   CHOL 120 01/10/2023   HDL 51 01/10/2023   LDLCALC 48 01/10/2023   TRIG 117 01/10/2023   CHOLHDL 2.4 01/10/2023    Radiology Reports Overnight EEG with video  Result Date: 01/26/2023 Charlsie Quest, MD     01/27/2023  8:14 AM Patient Name: ASALEE SYMINGTON MRN:  [id number] Epilepsy Attending: Charlsie Quest Referring Physician/Provider: Rejeana Brock, MD Duration: 01/26/2023 0500 to 01/27/2023 0500 Patient history: 57 year old female with fairly advanced multiple sclerosis  who presents with hallucinations, confusion and today episodes of behavioral arrest with eye deviation. EEG to evaluate for seizure. Level of alertness: Awake, asleep AEDs during EEG study: OXC Technical aspects: This EEG study was done with scalp electrodes positioned according to the 10-20 International system of electrode placement. Electrical activity was reviewed with band pass filter of 1-70Hz , sensitivity of 7 uV/mm, display speed of 89mm/sec with a 60Hz  notched filter applied as appropriate. EEG data were recorded continuously and digitally stored.  Video monitoring was available and reviewed as appropriate. Description: The posterior dominant rhythm consists of 7 Hz activity of moderate voltage (25-35 uV) seen predominantly in posterior head regions, symmetric and reactive to eye opening and eye closing. Sleep was characterized by sleep spindles (12-14hz ), maximal fronto-central region. EEG showed near continuous generalized polymorphic sharply contoured 5 to 6 Hz theta slowing admixed with intermittent 2-3hz  delta slowing, at times with triphasic morphology. Brief 1-2 seconds of generalized attenuation were also noted. Hyperventilation and photic stimulation were not performed.   ABNORMALITY - Continuous slow, generalized - Background slow IMPRESSION: This study is suggestive of moderate diffuse encephalopathy, nonspecific etiology. No seizures or definite epileptiform discharges were seen throughout the recording. Charlsie Quest   DG Chest Portable 1 View  Result Date: 01/25/2023 CLINICAL DATA:  Altered mental status, shortness of breath. EXAM: PORTABLE CHEST 1 VIEW COMPARISON:  June 23, 2018 FINDINGS: There is stable right-sided venous Port-A-Cath positioning. The heart size  and mediastinal contours are within normal limits. There is no evidence of acute infiltrate, pleural effusion or pneumothorax. The visualized skeletal structures are unremarkable. IMPRESSION: No active disease. Electronically Signed   By: Aram Candela M.D.   On: 01/25/2023 21:57      Signature  -   Susa Raring M.D on 01/27/2023 at 10:26 AM   -  To page go to [email or web address]

## 2023-01-27 NOTE — Progress Notes (Signed)
Neurology Progress Note  Brief HPI: 57 year old patient with history of MS, right nasal abscess as well as delirium associated with UTIs presented with confusion, hallucinations and episodes of behavioral arrest.  Patient's husband and caregiver states that she has been seeing spiders, snakes, people who were not there and that she thought she was in her wheelchair when she was in fact in her bed.  She would also have episodes of behavioral arrest where she would be talking to someone and then would stop and not respond for a little while.  Subjective: Patient is resting comfortably in bed and reports that she has had no hallucinations, family reports no episodes of behavioral arrest overnight.  She has no memory of episodes of hallucinations or behavioral arrest, but husband states that this is typical for her No events since Thursday evening   Exam: Vitals:   01/27/23 0306 01/27/23 0827  BP: 135/67 134/74  Pulse: 81   Resp: 14   Temp: 98 F (36.7 C) 97.7 F (36.5 C)  SpO2: 92%    Gen: In bed, NAD Resp: non-labored breathing, no acute distress Abd: soft, nt  Neuro: Mental Status: Alert and oriented to person place time and situation.  She has no memory of hallucinations and episodes of behavioral arrest Cranial Nerves: Pupils equal round and reactive to light, extraocular movements intact, facial sensation symmetrical, face symmetrical hearing intact to voice, phonation normal, shoulder shrug symmetrical, tongue midline Motor: Able to move right upper extremity with good antigravity strength, able to wiggle fingers on left upper extremity, unable to move bilateral lower extremities Sensory: Intact to light touch throughout Gait: Deferred  Pertinent Labs:    Latest Ref Rng & Units 01/27/2023    3:47 AM 01/26/2023    4:57 AM 01/25/2023   10:31 PM  CBC  WBC 4.0 - 10.5 K/uL 7.6  10.9  12.0   Hemoglobin 12.0 - 15.0 g/dL 16.1  09.6  04.5   Hematocrit 36.0 - 46.0 % 34.7  36.1  39.6    Platelets 150 - 400 K/uL 153  205  217        Latest Ref Rng & Units 01/27/2023    3:47 AM 01/26/2023    4:57 AM 01/25/2023   10:31 PM  BMP  Glucose 70 - 99 mg/dL 409  811  914   BUN 6 - 20 mg/dL 33  28  31   Creatinine 0.44 - 1.00 mg/dL 7.82  9.56  2.13   Sodium 135 - 145 mmol/L 137  140  141   Potassium 3.5 - 5.1 mmol/L 3.3  4.4  3.7   Chloride 98 - 111 mmol/L 102  106  107   CO2 22 - 32 mmol/L 23  23  25    Calcium 8.9 - 10.3 mg/dL 8.9  8.7  8.8      Imaging Reviewed:  No relevant imaging this admission  Assessment: 57 year old patient with history of MS, confusion and hallucinations associated with UTIs and nasal abscess presented with nasal abscess as well as auditory and visual hallucinations and episodes of behavioral arrest.  She has been treated for nasal abscess with appropriate antibiotics, and long-term EEG reveals mild diffuse encephalopathy but no seizure activity.  Patient's hallucinations and behavioral arrest episodes have stopped and her exam is at her baseline per patient and family.  Impression: Auditory and visual hallucinations along with episodes of behavioral arrest likely related to toxic metabolic encephalopathy in setting of nasal abscess  Recommendations: 1) continue Trileptal  150 mg 3 times daily 2) May discontinue long-term EEG as no epileptic activity found 3) treatment of nasal abscess per primary team 4) neurology will be available as needed going forward, please reach out if any additional questions or concerns arise.  Recommendations conveyed to primary team via secure chat  Cortney E Ernestina Columbia , MSN, AGACNP-BC [facility] See Amion for schedule and pager information 01/27/2023 10:57 AM   Attending Neurologist's note:  I personally saw this patient, gathering history, performing a neurologic examination, reviewing relevant labs, and formulated the assessment and plan, adding the note above for completeness and clarity to accurately  reflect my thoughts  Brooke Dare MD-PhD [facility] [phone] Available 7 AM to 7 PM, outside these hours please contact Neurologist on call listed on AMION

## 2023-01-27 NOTE — Evaluation (Signed)
Physical Therapy Evaluation Patient Details Name: Vanessa Short MRN: [id number] DOB: [birth year] Today's Date: 01/27/2023  History of Present Illness  Vanessa Short is a 57 y.o. female patient admitted with nasal abscess and hallucinations. Labwork positive for MRSA. EEG suggestive of moderate diffuse encephalopathy, nonspecific etiology. PMHx: multiple sclerosis (32 years ago ), major depressive disorder, R tib/fib fracture, L humeral head fracture, anxiety, hallucinations, and paraplegia.  Clinical Impression  Pt presents today functioning at her recent baseline. Pt reports full time care at home, requiring hoyer lift to get OOB and total assist for all mobility, pt unable to assist with rolling. Pt had a fall in December resulting in R tib/fib fracture, prior to the fall pt was utilizing a sit>stand lift for transfers and her goal is to get back to utilizing that lift, but declines any attempts during admission. Pt reports her plan is to continue to increase her wheelchair tolerance at home and then progress to utilizing her lift. Pt and husband agreeable to HHPT for progression back to utilizing the lift as well as maintaining ROM of RLE. Pt has no further benefit from skilled acute PT at this time, educated pt on utilizing chair position in the bed during admission and continuing her PROM, HHPT recommended at discharge. Acute PT will sign off at this time.        Recommendations for follow up therapy are one component of a multi-disciplinary discharge planning process, led by the attending physician.  Recommendations may be updated based on patient status, additional functional criteria and insurance authorization.  Follow Up Recommendations       Assistance Recommended at Discharge Frequent or constant Supervision/Assistance  Patient can return home with the following  Two people to help with walking and/or transfers;Two people to help with bathing/dressing/bathroom;Assistance with  feeding;Direct supervision/assist for medications management;Direct supervision/assist for financial management;Assist for transportation;Help with stairs or ramp for entrance    Equipment Recommendations None recommended by PT  Recommendations for Other Services       Functional Status Assessment Patient has not had a recent decline in their functional status     Precautions / Restrictions Precautions Precautions: Fall Restrictions Other Position/Activity Restrictions: Per husband, ortho recommending 50% WB to RLE, however pt is unable to tolerate so maintaining NWB at this time; no restriction on LUE.      Mobility  Bed Mobility               General bed mobility comments: pt declined EOB or rolling attempts, placed in chair position as lunch arriving and to encourage more upright tolerance    Transfers                   General transfer comment: pt declined any attempts at transfers during admission    Ambulation/Gait               General Gait Details: not ambulatory at baseline  Stairs            Wheelchair Mobility    Modified Rankin (Stroke Patients Only)       Balance Overall balance assessment: History of Falls, Needs assistance     Sitting balance - Comments: declined attempts at sitting EOB                                     Pertinent Vitals/Pain Pain Assessment Pain Assessment: Faces Faces Pain Scale:  No hurt    Home Living Family/patient expects to be discharged to:: Private residence Living Arrangements: Spouse/significant other Available Help at Discharge: Family;Personal care attendant;Available 24 hours/day Type of Home: House Home Access: Ramped entrance     Alternate Level Stairs-Number of Steps: pt doesn't access Home Layout: Two level;Able to live on main level with bedroom/bathroom Home Equipment: Other (comment);Wheelchair - power;BSC/3in1 (hoyer lift, sit>stand lift, adjustable  bed) Additional Comments: aide 7 days/week 7am-11pm    Prior Function Prior Level of Function : Needs assist             Mobility Comments: hoyer lift to w/c, BSC and shower chair; was using sit to stand lift for recliner transfers prior to fall in December ADLs Comments: Needs assist for all ADL/IADLs     Hand Dominance   Dominant Hand: Right    Extremity/Trunk Assessment   Upper Extremity Assessment Upper Extremity Assessment: Defer to OT evaluation RUE Deficits / Details: grossly 3+/5 strength, shoulder flexion limited to approx 80 degrees RUE Sensation: WNL LUE Deficits / Details: trace movement noted in LUE, history of humeral head fracture LUE Sensation: WNL    Lower Extremity Assessment Lower Extremity Assessment: RLE deficits/detail;LLE deficits/detail RLE Deficits / Details: no active movement, PROM to ~45 degrees hip flexion ~60 degrees knee flexion, limited by pain, reports working on PROM after fracture, no active movement but sensation intact to light touch LLE Deficits / Details: no active movement, sensation intact to light touch, PROM with hip and knee flexion to at least 90 degrees    Cervical / Trunk Assessment Cervical / Trunk Assessment: Kyphotic  Communication   Communication: No difficulties  Cognition Arousal/Alertness: Awake/alert Behavior During Therapy: WFL for tasks assessed/performed Overall Cognitive Status: Within Functional Limits for tasks assessed                                 General Comments: A&Ox4, pt and husband and caregiver report mentation back to baseline, declines anymore hallucinations        General Comments General comments (skin integrity, edema, etc.): VSS on room air, husband and caregiver present throughout sessino, EEG running    Exercises     Assessment/Plan    PT Assessment Patient does not need any further PT services  PT Problem List         PT Treatment Interventions      PT Goals  (Current goals can be found in the Care Plan section)  Acute Rehab PT Goals Patient Stated Goal: go home and get used to being in her wheelchair and progressing to using sit>stand lift PT Goal Formulation: All assessment and education complete, DC therapy    Frequency       Co-evaluation               AM-PAC PT "6 Clicks" Mobility  Outcome Measure Help needed turning from your back to your side while in a flat bed without using bedrails?: Total Help needed moving from lying on your back to sitting on the side of a flat bed without using bedrails?: Total Help needed moving to and from a bed to a chair (including a wheelchair)?: Total Help needed standing up from a chair using your arms (e.g., wheelchair or bedside chair)?: Total Help needed to walk in hospital room?: Total Help needed climbing 3-5 steps with a railing? : Total 6 Click Score: 6    End of Session  Activity Tolerance: Patient tolerated treatment well Patient left: in bed;with call bell/phone within reach;with family/visitor present Nurse Communication: Mobility status PT Visit Diagnosis: Other abnormalities of gait and mobility (R26.89)    Time: 1610-9604 PT Time Calculation (min) (ACUTE ONLY): 31 min   Charges:   PT Evaluation $PT Eval Moderate Complexity: 1 Mod          Lindalou Hose, PT DPT Acute Rehabilitation Services Office [phone]   Leonie Man 01/27/2023, 3:29 PM

## 2023-01-27 NOTE — Progress Notes (Signed)
SLP Cancellation Note  Patient Details Name: Vanessa Short MRN: [id number] DOB: 1965/10/13   Cancelled treatment:       Reason Eval/Treat Not Completed: Other (comment) (patient receiving medical care). SLP will return when able for bedside swallow evaluation. Please contact SLP via EPIC group chat ([facility] SLP) as needed.   Angela Nevin, MA, CCC-SLP Speech Therapy

## 2023-01-27 NOTE — Evaluation (Signed)
Occupational Therapy Evaluation Patient Details Name: Vanessa Short MRN: [id number] DOB: November 22, 1965 Today's Date: 01/27/2023   History of Present Illness Vanessa Short is a 57 y.o. female patient admitted with nasal abscess and hallucinations. Labwork positive for MRSA. EEG suggestive of moderate diffuse encephalopathy, nonspecific etiology. PMHx: multiple sclerosis (32 years ago ), major depressive disorder, R tib/fib fracture, L humeral head fracture, anxiety, hallucinations, and paraplegia.   Clinical Impression   Pt evaluated s/p above admission list. Pt reports requiring assistance for ADL/IADLs and uses hoyer lift and power w/c at baseline for functional transfers and mobility. Pt reports a fall in December 2023 resulting in right tib/fib and left humeral head fracture and has not stood using her sit to stand lift since then. Pt limited secondary to bilateral UE and LE weakness. During session, pt declined rolling and EOB activity. Pt tolerated partial bed in chair position. Pt currently requires max A for bed level UB ADLs and total A for LB ADLs. Pt reports dependence with self-feeding and would like to increase independence with this task. Will further assess self-feeding using AE as needed. Pt would continue to benefit from acute OT services to maximize independence with self-feeding tasks and facilitate transition to pt's home environment with continued OT services upon discharge.      Recommendations for follow up therapy are one component of a multi-disciplinary discharge planning process, led by the attending physician.  Recommendations may be updated based on patient status, additional functional criteria and insurance authorization.   Assistance Recommended at Discharge Frequent or constant Supervision/Assistance  Patient can return home with the following Two people to help with walking and/or transfers;Two people to help with bathing/dressing/bathroom;Assistance with  cooking/housework;Assistance with feeding;Direct supervision/assist for medications management;Direct supervision/assist for financial management;Assist for transportation;Help with stairs or ramp for entrance    Functional Status Assessment  Patient has had a recent decline in their functional status and demonstrates the ability to make significant improvements in function in a reasonable and predictable amount of time.  Equipment Recommendations  None recommended by OT    Recommendations for Other Services       Precautions / Restrictions Precautions Precautions: Fall Restrictions Other Position/Activity Restrictions: Per husband, ortho recommending 50% WB to RLE, however pt is unable to tolerate so maintaining NWB at this time; no restriction on LUE.      Mobility Bed Mobility               General bed mobility comments: Not assessed, pt declined EOB attempt and rolling this session. Pt agreeable to partial bed in chair position.    Transfers                   General transfer comment: Pt declined rolling, EOB activity this session. Pt uses a hoyer and sit to stand lift at baseline.      Balance Overall balance assessment: History of Falls, Needs assistance                             High Level Balance Comments: Unable to assess, pt declined EOB attempts this session.           ADL either performed or assessed with clinical judgement   ADL Overall ADL's : Needs assistance/impaired Eating/Feeding: Maximal assistance;Bed level   Grooming: Maximal assistance;Bed level   Upper Body Bathing: Maximal assistance;Bed level   Lower Body Bathing: Total assistance;Bed level   Upper Body Dressing :  Maximal assistance;Bed level   Lower Body Dressing: Total assistance;Bed level   Toilet Transfer: Total assistance;BSC/3in1 (hoyer)   Toileting- Architect and Hygiene: Total assistance       Functional mobility during ADLs: Total  assistance (hoyer) General ADL Comments: Pt limited secondary to paraplegia, LUE AROM. Uses a hoyer at baseline for functional transfers and assistance from caregivers for ADL/IADLs. Pt declined EOB attempt this session.     Vision Baseline Vision/History: 1 Wears glasses Ability to See in Adequate Light: 0 Adequate Vision Assessment?: No apparent visual deficits     Perception Perception Perception Tested?: No   Praxis Praxis Praxis tested?: Not tested    Pertinent Vitals/Pain Pain Assessment Pain Assessment: Faces Faces Pain Scale: No hurt Pain Intervention(s): Monitored during session     Hand Dominance Right   Extremity/Trunk Assessment Upper Extremity Assessment Upper Extremity Assessment: RUE deficits/detail;LUE deficits/detail RUE Deficits / Details: grossly 3+/5 strength, shoulder flexion limited to approx 80 degrees RUE Sensation: WNL LUE Deficits / Details: trace movement noted in LUE, history of humeral head fracture LUE Sensation: WNL   Lower Extremity Assessment Lower Extremity Assessment: Defer to PT evaluation   Cervical / Trunk Assessment Cervical / Trunk Assessment: Kyphotic   Communication Communication Communication: No difficulties   Cognition Arousal/Alertness: Awake/alert Behavior During Therapy: WFL for tasks assessed/performed Overall Cognitive Status: Within Functional Limits for tasks assessed                                 General Comments: Pt A+O x4, reports coming into the hospital for hallucinations, both pt and caregivers report this has resolved.     General Comments  VSS on RA, EEG equipment attached, husband and aide present throughout    Exercises     Shoulder Instructions      Home Living Family/patient expects to be discharged to:: Private residence Living Arrangements: Spouse/significant other Available Help at Discharge: Family;Personal care attendant;Available 24 hours/day Type of Home: House Home  Access: Ramped entrance     Home Layout: Two level;Able to live on main level with bedroom/bathroom Alternate Level Stairs-Number of Steps: pt doesn't access   Bathroom Shower/Tub: Walk-in shower         Home Equipment: Other (comment);Wheelchair - power;BSC/3in1 (hoyer lift, sit to stand lift, adjustable bed)   Additional Comments: aide 7 days/week 7am-11pm      Prior Functioning/Environment Prior Level of Function : Needs assist             Mobility Comments: hoyer lift to w/c, BSC and shower chair; was using sit to stand lift for recliner transfers prior to fall in December ADLs Comments: Needs assist for all ADL/IADLs        OT Problem List: Decreased strength;Decreased range of motion;Decreased activity tolerance;Impaired balance (sitting and/or standing);Impaired UE functional use      OT Treatment/Interventions: Self-care/ADL training;Energy conservation;DME and/or AE instruction;Therapeutic activities;Patient/family education;Balance training    OT Goals(Current goals can be found in the care plan section) Acute Rehab OT Goals Patient Stated Goal: to feed herself OT Goal Formulation: With patient Time For Goal Achievement: 02/10/23 Potential to Achieve Goals: Fair ADL Goals Pt Will Perform Eating: with mod assist;with adaptive utensils  OT Frequency: Min 2X/week    Co-evaluation              AM-PAC OT "6 Clicks" Daily Activity     Outcome Measure Help from another person eating meals?:  A Lot Help from another person taking care of personal grooming?: A Lot Help from another person toileting, which includes using toliet, bedpan, or urinal?: Total Help from another person bathing (including washing, rinsing, drying)?: A Lot Help from another person to put on and taking off regular upper body clothing?: A Lot Help from another person to put on and taking off regular lower body clothing?: Total 6 Click Score: 10   End of Session Equipment Utilized  During Treatment: Other (comment) (none) Nurse Communication: Mobility status  Activity Tolerance: Patient tolerated treatment well Patient left: in bed;with call bell/phone within reach;with family/visitor present  OT Visit Diagnosis: History of falling (Z91.81);Muscle weakness (generalized) (M62.81);Feeding difficulties (R63.3);Hemiplegia and hemiparesis                Time: 1039-1110 OT Time Calculation (min): 31 min Charges:  OT General Charges $OT Visit: 1 Visit OT Evaluation $OT Eval Moderate Complexity: 1 Mod  Sherley Bounds, OTS Acute Rehabilitation Services Office [phone] Secure Chat Communication Preferred   Sherley Bounds 01/27/2023, 1:25 PM

## 2023-01-28 DIAGNOSIS — A4902 Methicillin resistant Staphylococcus aureus infection, unspecified site: Secondary | ICD-10-CM | POA: Diagnosis not present

## 2023-01-28 LAB — BASIC METABOLIC PANEL
Anion gap: 12 (ref 5–15)
BUN: 34 mg/dL — ABNORMAL HIGH (ref 6–20)
CO2: 23 mmol/L (ref 22–32)
Calcium: 9.2 mg/dL (ref 8.9–10.3)
Chloride: 104 mmol/L (ref 98–111)
Creatinine, Ser: 1.04 mg/dL — ABNORMAL HIGH (ref 0.44–1.00)
GFR, Estimated: >60 mL/min (ref >60)
Glucose, Bld: 114 mg/dL — ABNORMAL HIGH (ref 70–99)
Potassium: 4.1 mmol/L (ref 3.5–5.1)
Sodium: 139 mmol/L (ref 135–145)

## 2023-01-28 LAB — CULTURE, BLOOD (ROUTINE X 2)

## 2023-01-28 LAB — MAGNESIUM: Magnesium: 2.2 mg/dL (ref 1.7–2.4)

## 2023-01-28 MED ORDER — DOXYCYCLINE HYCLATE 100 MG PO CAPS: 100.0000 mg | ORAL_CAPSULE | Freq: Two times a day (BID) | ORAL | 0 refills | Status: DC

## 2023-01-28 MED ORDER — MUPIROCIN 2 % EX OINT: 1.0000 | TOPICAL_OINTMENT | Freq: Two times a day (BID) | CUTANEOUS | 0 refills | Status: DC

## 2023-01-28 MED ORDER — HEPARIN SOD (PORK) LOCK FLUSH 100 UNIT/ML IV SOLN
500.0000 [IU] | INTRAVENOUS | Status: AC | PRN
  Administered 2023-01-28: 500 [IU]

## 2023-01-28 NOTE — Discharge Instructions (Signed)
Follow with Primary MD Richmond Campbell., [name] in 7 days, also follow-up with the ENT physician in the next 7 to 10 days.  Get CBC, CMP,  -  checked next visit with your primary MD   Activity: As tolerated with Full fall precautions use walker/cane & assistance as needed  Disposition Home   Diet: Heart Healthy   Special Instructions: If you have smoked or chewed Tobacco  in the last 2 yrs please stop smoking, stop any regular Alcohol  and or any Recreational drug use.  On your next visit with your primary care physician please Get Medicines reviewed and adjusted.  Please request your Prim.MD to go over all Hospital Tests and Procedure/Radiological results at the follow up, please get all Hospital records sent to your Prim MD by signing hospital release before you go home.  If you experience worsening of your admission symptoms, develop shortness of breath, life threatening emergency, suicidal or homicidal thoughts you must seek medical attention immediately by calling 911 or calling your MD immediately  if symptoms less severe.  You Must read complete instructions/literature along with all the possible adverse reactions/side effects for all the Medicines you take and that have been prescribed to you. Take any new Medicines after you have completely understood and accpet all the possible adverse reactions/side effects.

## 2023-01-28 NOTE — Discharge Summary (Signed)
Vanessa Short ZOX:[id number] DOB: 26-May-1966 DOA: 01/25/2023  PCP: Richmond Campbell., [name]  Admit date: 01/25/2023  Discharge date: 01/28/2023  Admitted From: Home   Disposition:  Home   Recommendations for Outpatient Follow-up:   Follow up with PCP in 1-2 weeks  PCP Please obtain BMP/CBC, 2 view CXR in 1week,  (see Discharge instructions)   PCP Please follow up on the following pending results: Monitor the right nasal soft tissue infection closely, consider referral to ENT 1 time in the next 7 to 10 days.   Home Health: as before   Equipment/Devices: None  Consultations: None  Discharge Condition: Stable    CODE STATUS: Full    Diet Recommendation: Heart Healthy     Chief Complaint  Patient presents with   Altered Mental Status     Brief history of present illness from the day of admission and additional interim summary    57 y.o. female with medical history significant of HTN, MS, and UC presenting with AMS, concern for seizures with 2 days of intermittent visual hallucinations and discordant gaze.   She was seen on 6/8 for an abscess on her R nares, MRSA positive, given Flagyl, Doxy, Cefdinir.  She returned on 6/10 with hallucinations, Flagyl stopped.  UA by PCP on 6/14 with moderate Hgb and small LE, no apparent culture pending.     Husband and caregiver report that she had a nasty fall with R lower leg fractures in December and has been immobile since.  They are still using a Hoyer lift but are hoping to start using a lift chair soon.  She has periodically had mild hallucinations (usually associated with UTI) but over the last 3 days has been having marked hallucinations with frank conversations with imagined people.  The nares infection is markedly improved but the psychosis was worse than ever with  ?horizontal nystagmus so they decided to bring her in, she was admitted for encephalopathy possible seizure-like activity.                                                                 Hospital Course   Delirium/toxic encephalopathy -Patient presenting with encephalopathy as evidenced by her psychosis, she has had h/o periodic mild delirium and known MS but no frank cognitive dysfunction at baseline, encephalopathy could have been due to recent right nasal infection or interaction with one of the antibiotics that she was given.  No focal deficits or headache, seen by neurology and psych.  Overnight EEG nonacute.  Mentation much improved, continue on present oral doxycycline and mupirocin cream for her nasal infection, UA stable.  Now at her baseline with be discharged home.  Continue oral doxycycline and mupirocin ointment for another 7 to 10 days, follow-up with PCP and possibly ENT physician in the next week  or so.    Overall cellulitis almost completely resolved, no drainage or redness, she likely had encephalopathy due to multiple antibiotics which were started recently, this has now completely resolved.   MRSA right nasal nare infection -Could have been a pustule which has self drained, much improved now on mupirocin cream along with doxycycline, skin culture positive for MRSA but could be just MRSA colonization.  Post discharge follow-up with PCP and dermatology x 1.  Low likelihood of basal cell CA.   MS with ambulatory dysfunction, at baseline wheelchair and bedbound with functional paraplegia and minimal upper extremity strength -Mostly bedbound, trying to get more mobile -Continue home meds - acyclovir, baclofen, dantrolene, diazepam, Trileptal, teriflunomide -PT/OT/speech consults were done, stable.   UC -Diagnosed in 2012 but normal colonoscopy in 2016 so diagnosis is currently suspect -Her only current medications are famotidine and Amitiza   HTN -Continue labetalol     Discharge diagnosis     Principal Problem:   MRSA (methicillin resistant Staphylococcus aureus) infection Active Problems:   Multiple sclerosis (HCC)   Ulcerative colitis (HCC)   Hypertension   Delirium due to another medical condition, acute, hyperactive    Discharge instructions    Discharge Instructions     Discharge instructions   Complete by: As directed    Follow with Primary MD Richmond Campbell., [name] in 7 days, also follow-up with the ENT physician in the next 7 to 10 days.  Get CBC, CMP,  -  checked next visit with your primary MD   Activity: As tolerated with Full fall precautions use walker/cane & assistance as needed  Disposition Home   Diet: Heart Healthy   Special Instructions: If you have smoked or chewed Tobacco  in the last 2 yrs please stop smoking, stop any regular Alcohol  and or any Recreational drug use.  On your next visit with your primary care physician please Get Medicines reviewed and adjusted.  Please request your Prim.MD to go over all Hospital Tests and Procedure/Radiological results at the follow up, please get all Hospital records sent to your Prim MD by signing hospital release before you go home.  If you experience worsening of your admission symptoms, develop shortness of breath, life threatening emergency, suicidal or homicidal thoughts you must seek medical attention immediately by calling 911 or calling your MD immediately  if symptoms less severe.  You Must read complete instructions/literature along with all the possible adverse reactions/side effects for all the Medicines you take and that have been prescribed to you. Take any new Medicines after you have completely understood and accpet all the possible adverse reactions/side effects.   Increase activity slowly   Complete by: As directed        Discharge Medications   Allergies as of 01/28/2023       Reactions   Nitrofurantoin Other (See Comments)   syncope    Oxycodone-acetaminophen Other (See Comments)   Other   Risperidone And Related    Became very lethargic   Penicillins Rash   Has patient had a PCN reaction causing immediate rash, facial/tongue/throat swelling, SOB or lightheadedness with hypotension: NO Has patient had a PCN reaction causing severe rash involving mucus membranes or skin necrosis: NO Has patient had a PCN reaction that required hospitalization NO Has patient had a PCN reaction occurring within the last 10 years:NO If all of the above answers are "NO", then may proceed with Cephalosporin use.        Medication List  STOP taking these medications    cefdinir 300 MG capsule Commonly known as: OMNICEF       TAKE these medications    acetaminophen 500 MG tablet Commonly known as: TYLENOL Take by mouth at bedtime.   acyclovir 400 MG tablet Commonly known as: ZOVIRAX Take 400 mg by mouth daily. Take every day per patient   aspirin EC 81 MG tablet Take 81 mg by mouth daily.   b complex vitamins tablet Take 1 tablet by mouth daily.   baclofen 20 MG tablet Commonly known as: LIORESAL One po qid   cholecalciferol 1000 units tablet Commonly known as: VITAMIN D Take 1,000 Units by mouth daily.   dantrolene 50 MG capsule Commonly known as: DANTRIUM Take 1 capsule (50 mg total) by mouth in the morning, at noon, in the evening, and at bedtime.   diazepam 5 MG tablet Commonly known as: Valium One po qHS   doxycycline 100 MG capsule Commonly known as: VIBRAMYCIN Take 1 capsule (100 mg total) by mouth 2 (two) times daily.   famotidine 20 MG tablet Commonly known as: PEPCID Take 40 mg by mouth 2 (two) times daily.   FLUoxetine 20 MG capsule Commonly known as: PROZAC Take 20 mg by mouth 3 (three) times daily.   folic acid 400 MCG tablet Commonly known as: FOLVITE Take 800 mcg by mouth daily.   furosemide 40 MG tablet Commonly known as: LASIX Take 1 tablet (40 mg total) by mouth 2 (two) times  daily.   labetalol 100 MG tablet Commonly known as: NORMODYNE TAKE 1/2 TABLET BY MOUTH DAILY   lidocaine-prilocaine cream Commonly known as: EMLA Apply one inch before port-a cath access prn   mesalamine 0.375 g 24 hr capsule Commonly known as: APRISO Take 375 mg by mouth QID.   multivitamin tablet Take 1 tablet by mouth daily.   mupirocin ointment 2 % Commonly known as: BACTROBAN Apply 1 Application topically 2 (two) times daily.   OXcarbazepine 150 MG tablet Commonly known as: TRILEPTAL Take 1 tablet (150 mg total) by mouth 3 (three) times daily.   potassium chloride 10 MEQ tablet Commonly known as: KLOR-CON Take 20 mEq by mouth daily.   QUEtiapine 25 MG tablet Commonly known as: SEROQUEL Take 1 tablet (25 mg total) by mouth at bedtime.   rosuvastatin 40 MG tablet Commonly known as: CRESTOR Take 40 mg by mouth daily.   sodium chloride 0.65 % Soln nasal spray Commonly known as: OCEAN Place 1 spray into both nostrils as needed for congestion.   Teriflunomide 14 MG Tabs Take 1 tablet (14 mg total) by mouth daily.   tolterodine 2 MG tablet Commonly known as: DETROL Take 2 mg by mouth 2 (two) times daily.         Follow-up Information     Richmond Campbell., [name]. Schedule an appointment as soon as possible for a visit in 1 week(s).   Specialty: Family Medicine Why: Also see a ENT physician in the next 5 to 7 days for the nasal infection Contact information: [address] [place] [address] [phone]                 Major procedures and Radiology Reports - PLEASE review detailed and final reports thoroughly  -      Overnight EEG with video  Result Date: 01/26/2023 Charlsie Quest, MD     01/27/2023  8:14 AM Patient Name: Vanessa Short MRN: [id number] Epilepsy Attending: Charlsie Quest Referring  Physician/Provider: Rejeana Brock, MD Duration: 01/26/2023 0500 to 01/27/2023 0500 Patient history: 57 year old female with  fairly advanced multiple sclerosis who presents with hallucinations, confusion and today episodes of behavioral arrest with eye deviation. EEG to evaluate for seizure. Level of alertness: Awake, asleep AEDs during EEG study: OXC Technical aspects: This EEG study was done with scalp electrodes positioned according to the 10-20 International system of electrode placement. Electrical activity was reviewed with band pass filter of 1-70Hz , sensitivity of 7 uV/mm, display speed of 80mm/sec with a 60Hz  notched filter applied as appropriate. EEG data were recorded continuously and digitally stored.  Video monitoring was available and reviewed as appropriate. Description: The posterior dominant rhythm consists of 7 Hz activity of moderate voltage (25-35 uV) seen predominantly in posterior head regions, symmetric and reactive to eye opening and eye closing. Sleep was characterized by sleep spindles (12-14hz ), maximal fronto-central region. EEG showed near continuous generalized polymorphic sharply contoured 5 to 6 Hz theta slowing admixed with intermittent 2-3hz  delta slowing, at times with triphasic morphology. Brief 1-2 seconds of generalized attenuation were also noted. Hyperventilation and photic stimulation were not performed.   ABNORMALITY - Continuous slow, generalized - Background slow IMPRESSION: This study is suggestive of moderate diffuse encephalopathy, nonspecific etiology. No seizures or definite epileptiform discharges were seen throughout the recording. Charlsie Quest   DG Chest Portable 1 View  Result Date: 01/25/2023 CLINICAL DATA:  Altered mental status, shortness of breath. EXAM: PORTABLE CHEST 1 VIEW COMPARISON:  June 23, 2018 FINDINGS: There is stable right-sided venous Port-A-Cath positioning. The heart size and mediastinal contours are within normal limits. There is no evidence of acute infiltrate, pleural effusion or pneumothorax. The visualized skeletal structures are unremarkable.  IMPRESSION: No active disease. Electronically Signed   By: Aram Candela M.D.   On: 01/25/2023 21:57   CT Maxillofacial W Contrast  Result Date: 01/20/2023 CLINICAL DATA:  Initial evaluation for nasal abscess. EXAM: CT MAXILLOFACIAL WITH CONTRAST TECHNIQUE: Multidetector CT imaging of the maxillofacial structures was performed with intravenous contrast. Multiplanar CT image reconstructions were also generated. RADIATION DOSE REDUCTION: This exam was performed according to the departmental dose-optimization program which includes automated exposure control, adjustment of the mA and/or kV according to patient size and/or use of iterative reconstruction technique. CONTRAST:  75mL OMNIPAQUE IOHEXOL 350 MG/ML SOLN COMPARISON:  None Available. FINDINGS: Osseous: No acute osseous finding. No discrete or worrisome osseous lesions. Orbits: Globes and orbital soft tissues within normal limits. Sinuses: Paranasal sinuses are largely clear. Trace right mastoid effusion, of doubtful significance. Soft tissues: Localized swelling seen at the anterior aspect of the nasal septum, just inferior to the nasal vestibule. Hypodense collection at this location measuring 10 x 8 x 8 mm, consistent with a small abscess (series 3, image 46). No other acute soft tissue abnormality about the face. Limited intracranial: Atrophy with chronic small vessel ischemic disease. Probable benign arachnoid cyst at the left middle cranial fossa. IMPRESSION: 10 x 8 x 8 mm abscess just inferior to the nasal vestibule. Electronically Signed   By: Rise Mu M.D.   On: 01/20/2023 21:08     Today   Subjective    Alajia Gilvin today has no headache,no chest abdominal pain,no new weakness tingling or numbness, feels much better wants to go home today.     Objective   Blood pressure (!) 143/80, pulse 79, temperature 98 F (36.7 C), temperature source Oral, resp. rate 16, height 5\' 3"  (1.6 m), weight 81.6 kg, SpO2 95  %.  Intake/Output Summary (Last 24 hours) at 01/28/2023 0823 Last data filed at 01/27/2023 1105 Gross per 24 hour  Intake 10 ml  Output --  Net 10 ml    Exam  Awake Alert, No new F.N deficits,  functional paraplegia with 4/5 strength in upper extremities all from underlying MS, chronically bed and wheelchair-bound,  [place].AT,PERRAL, R nasal 4mm lesion healing well, no drainage, no surrounding redness Supple Neck,   Symmetrical Chest wall movement, Good air movement bilaterally, CTAB RRR,No Gallops,   +ve B.Sounds, Abd Soft, Non tender,  No Cyanosis, Clubbing or edema    Data Review   Recent Labs  Lab 01/22/23 1612 01/25/23 2231 01/26/23 0457 01/27/23 0347  WBC 10.3 12.0* 10.9* 7.6  HGB 11.5* 12.2 11.3* 10.8*  HCT 36.9 39.6 36.1 34.7*  PLT 206 217 205 153  MCV 93.4 97.3 94.3 94.0  MCH 29.1 30.0 29.5 29.3  MCHC 31.2 30.8 31.3 31.1  RDW 14.9 16.0* 15.9* 15.9*  LYMPHSABS 0.6* 1.0 1.1  --   MONOABS 1.2* 1.2* 1.2*  --   EOSABS 0.3 0.3 0.3  --   BASOSABS 0.1 0.1 0.1  --     Recent Labs  Lab 01/22/23 1612 01/25/23 2231 01/26/23 0457 01/27/23 0347 01/28/23 0408  NA 140 141 140 137 139  K 3.0* 3.7 4.4 3.3* 4.1  CL 104 107 106 102 104  CO2 22 25 23 23 23   ANIONGAP 14 9 11 12 12   GLUCOSE 155* 123* 115* 101* 114*  BUN 19 31* 28* 33* 34*  CREATININE 1.05* 1.07* 1.07* 1.12* 1.04*  AST 25 29 42*  --   --   ALT 35 40 39  --   --   ALKPHOS 110 107 114  --   --   BILITOT 0.5 0.5 0.9  --   --   ALBUMIN 3.1* 3.3* 2.9*  --   --   MG  --  2.2 2.2  --  2.2  CALCIUM 9.0 8.8* 8.7* 8.9 9.2    Total Time in preparing paper work, data evaluation and todays exam - 35 minutes  Signature  -    Susa Raring M.D on 01/28/2023 at 8:23 AM   -  To page go to [email or web address]

## 2023-01-28 NOTE — Progress Notes (Signed)
SLP Cancellation Note  Patient Details Name: Vanessa Short MRN: [id number] DOB: [birth year]   Cancelled treatment:       Reason Eval/Treat Not Completed: SLP screened, no needs identified, will sign off. Patient and spouse both deny any h/o or current swallowing difficulties. She has been eating regular texture solids, drinking thin liquids on meal trays without reported s/s dysphagia. SLP to s/o. Thank you for this consult!   Angela Nevin, MA, CCC-SLP Speech Therapy

## 2023-01-29 LAB — CULTURE, BLOOD (ROUTINE X 2): Special Requests: ADEQUATE

## 2023-01-31 ENCOUNTER — Telehealth: Payer: Self-pay | Admitting: Neurology

## 2023-01-31 ENCOUNTER — Encounter: Payer: Self-pay | Admitting: Neurology

## 2023-01-31 ENCOUNTER — Emergency Department (HOSPITAL_COMMUNITY): Payer: Medicare Other

## 2023-01-31 ENCOUNTER — Encounter (HOSPITAL_COMMUNITY): Payer: Self-pay

## 2023-01-31 ENCOUNTER — Other Ambulatory Visit: Payer: Self-pay

## 2023-01-31 ENCOUNTER — Observation Stay (HOSPITAL_COMMUNITY)
Admission: EM | Admit: 2023-01-31 | Discharge: 2023-02-03 | Disposition: A | Discharge status: Home / Self Care | Payer: Medicare Other | Attending: Internal Medicine | Admitting: Internal Medicine

## 2023-01-31 DIAGNOSIS — G9341 Metabolic encephalopathy: Principal | ICD-10-CM | POA: Insufficient documentation

## 2023-01-31 DIAGNOSIS — F32A Depression, unspecified: Secondary | ICD-10-CM | POA: Insufficient documentation

## 2023-01-31 DIAGNOSIS — K5190 Ulcerative colitis, unspecified, without complications: Secondary | ICD-10-CM | POA: Diagnosis present

## 2023-01-31 DIAGNOSIS — Z7982 Long term (current) use of aspirin: Secondary | ICD-10-CM | POA: Insufficient documentation

## 2023-01-31 DIAGNOSIS — R5383 Other fatigue: Secondary | ICD-10-CM | POA: Diagnosis present

## 2023-01-31 DIAGNOSIS — G9340 Encephalopathy, unspecified: Secondary | ICD-10-CM | POA: Diagnosis present

## 2023-01-31 DIAGNOSIS — G4733 Obstructive sleep apnea (adult) (pediatric): Secondary | ICD-10-CM | POA: Diagnosis not present

## 2023-01-31 DIAGNOSIS — R443 Hallucinations, unspecified: Secondary | ICD-10-CM

## 2023-01-31 DIAGNOSIS — M6281 Muscle weakness (generalized): Secondary | ICD-10-CM | POA: Diagnosis not present

## 2023-01-31 DIAGNOSIS — F419 Anxiety disorder, unspecified: Secondary | ICD-10-CM | POA: Insufficient documentation

## 2023-01-31 DIAGNOSIS — I10 Essential (primary) hypertension: Secondary | ICD-10-CM | POA: Diagnosis present

## 2023-01-31 DIAGNOSIS — G35 Multiple sclerosis: Secondary | ICD-10-CM | POA: Diagnosis present

## 2023-01-31 DIAGNOSIS — F418 Other specified anxiety disorders: Secondary | ICD-10-CM | POA: Insufficient documentation

## 2023-01-31 DIAGNOSIS — Z79899 Other long term (current) drug therapy: Secondary | ICD-10-CM | POA: Insufficient documentation

## 2023-01-31 DIAGNOSIS — N390 Urinary tract infection, site not specified: Secondary | ICD-10-CM | POA: Diagnosis not present

## 2023-01-31 DIAGNOSIS — R2681 Unsteadiness on feet: Secondary | ICD-10-CM | POA: Diagnosis not present

## 2023-01-31 DIAGNOSIS — R7401 Elevation of levels of liver transaminase levels: Secondary | ICD-10-CM | POA: Diagnosis present

## 2023-01-31 DIAGNOSIS — Z9181 History of falling: Secondary | ICD-10-CM | POA: Insufficient documentation

## 2023-01-31 DIAGNOSIS — E785 Hyperlipidemia, unspecified: Secondary | ICD-10-CM | POA: Insufficient documentation

## 2023-01-31 LAB — I-STAT VENOUS BLOOD GAS, ED
Acid-Base Excess: 1 mmol/L (ref 0.0–2.0)
Bicarbonate: 27.2 mmol/L (ref 20.0–28.0)
Calcium, Ion: 1.01 mmol/L — ABNORMAL LOW (ref 1.15–1.40)
HCT: 39 % (ref 36.0–46.0)
Hemoglobin: 13.3 g/dL (ref 12.0–15.0)
O2 Saturation: 98 %
Potassium: 3.9 mmol/L (ref 3.5–5.1)
Sodium: 144 mmol/L (ref 135–145)
TCO2: 29 mmol/L (ref 22–32)
pCO2, Ven: 50.5 mmHg (ref 44–60)
pH, Ven: 7.339 (ref 7.25–7.43)
pO2, Ven: 104 mmHg — ABNORMAL HIGH (ref 32–45)

## 2023-01-31 LAB — BASIC METABOLIC PANEL
Anion gap: 11 (ref 5–15)
BUN: 33 mg/dL — ABNORMAL HIGH (ref 6–20)
CO2: 25 mmol/L (ref 22–32)
Calcium: 9.4 mg/dL (ref 8.9–10.3)
Chloride: 109 mmol/L (ref 98–111)
Creatinine, Ser: 1.27 mg/dL — ABNORMAL HIGH (ref 0.44–1.00)
GFR, Estimated: 49 mL/min — ABNORMAL LOW (ref >60)
Glucose, Bld: 123 mg/dL — ABNORMAL HIGH (ref 70–99)
Potassium: 4.3 mmol/L (ref 3.5–5.1)
Sodium: 145 mmol/L (ref 135–145)

## 2023-01-31 LAB — URINALYSIS, ROUTINE W REFLEX MICROSCOPIC
Bilirubin Urine: NEGATIVE
Glucose, UA: NEGATIVE mg/dL
Hgb urine dipstick: NEGATIVE
Ketones, ur: NEGATIVE mg/dL
Nitrite: NEGATIVE
Protein, ur: NEGATIVE mg/dL
Specific Gravity, Urine: 1.01 (ref 1.005–1.030)
pH: 5 (ref 5.0–8.0)

## 2023-01-31 LAB — CBG MONITORING, ED: Glucose-Capillary: 123 mg/dL — ABNORMAL HIGH (ref 70–99)

## 2023-01-31 LAB — CULTURE, BLOOD (ROUTINE X 2): Culture: NO GROWTH

## 2023-01-31 LAB — CBC
HCT: 40.2 % (ref 36.0–46.0)
HCT: 40.8 % (ref 36.0–46.0)
Hemoglobin: 12.3 g/dL (ref 12.0–15.0)
Hemoglobin: 12.7 g/dL (ref 12.0–15.0)
MCH: 29.2 pg (ref 26.0–34.0)
MCH: 30 pg (ref 26.0–34.0)
MCHC: 30.6 g/dL (ref 30.0–36.0)
MCHC: 31.1 g/dL (ref 30.0–36.0)
MCV: 95.5 fL (ref 80.0–100.0)
MCV: 96.2 fL (ref 80.0–100.0)
Platelets: 154 10*3/uL (ref 150–400)
Platelets: 161 10*3/uL (ref 150–400)
RBC: 4.21 MIL/uL (ref 3.87–5.11)
RBC: 4.24 MIL/uL (ref 3.87–5.11)
RDW: 16.6 % — ABNORMAL HIGH (ref 11.5–15.5)
RDW: 16.6 % — ABNORMAL HIGH (ref 11.5–15.5)
WBC: 10.8 10*3/uL — ABNORMAL HIGH (ref 4.0–10.5)
WBC: 8 10*3/uL (ref 4.0–10.5)
nRBC: 0 % (ref 0.0–0.2)
nRBC: 0 % (ref 0.0–0.2)

## 2023-01-31 LAB — HEPATIC FUNCTION PANEL
ALT: 83 U/L — ABNORMAL HIGH (ref 0–44)
AST: 64 U/L — ABNORMAL HIGH (ref 15–41)
Albumin: 3.2 g/dL — ABNORMAL LOW (ref 3.5–5.0)
Alkaline Phosphatase: 142 U/L — ABNORMAL HIGH (ref 38–126)
Bilirubin, Direct: 0.1 mg/dL (ref 0.0–0.2)
Indirect Bilirubin: 0.5 mg/dL (ref 0.3–0.9)
Total Bilirubin: 0.6 mg/dL (ref 0.3–1.2)
Total Protein: 6.3 g/dL — ABNORMAL LOW (ref 6.5–8.1)

## 2023-01-31 LAB — I-STAT BETA HCG BLOOD, ED (MC, WL, AP ONLY): I-stat hCG, quantitative: 13.5 m[IU]/mL — ABNORMAL HIGH (ref <5)

## 2023-01-31 LAB — DIFFERENTIAL
Abs Immature Granulocytes: 0.02 10*3/uL (ref 0.00–0.07)
Basophils Absolute: 0.1 10*3/uL (ref 0.0–0.1)
Basophils Relative: 1 %
Eosinophils Absolute: 0.1 10*3/uL (ref 0.0–0.5)
Eosinophils Relative: 2 %
Immature Granulocytes: 0 %
Lymphocytes Relative: 11 %
Lymphs Abs: 0.8 10*3/uL (ref 0.7–4.0)
Monocytes Absolute: 1.2 10*3/uL — ABNORMAL HIGH (ref 0.1–1.0)
Monocytes Relative: 15 %
Neutro Abs: 5.7 10*3/uL (ref 1.7–7.7)
Neutrophils Relative %: 71 %

## 2023-01-31 LAB — AMMONIA: Ammonia: <10 umol/L (ref 9–35)

## 2023-01-31 LAB — T4, FREE: Free T4: 0.75 ng/dL (ref 0.61–1.12)

## 2023-01-31 LAB — TSH: TSH: 3.649 u[IU]/mL (ref 0.350–4.500)

## 2023-01-31 LAB — HIV ANTIBODY (ROUTINE TESTING W REFLEX): HIV Screen 4th Generation wRfx: NONREACTIVE

## 2023-01-31 MED ORDER — POTASSIUM CHLORIDE CRYS ER 10 MEQ PO TBCR
20.0000 meq | EXTENDED_RELEASE_TABLET | Freq: Every day | ORAL | Status: DC
  Administered 2023-02-01 – 2023-02-02 (×2): 20 meq via ORAL
  Filled 2023-01-31 (×3): qty 2

## 2023-01-31 MED ORDER — B COMPLEX-C PO TABS
1.0000 | ORAL_TABLET | Freq: Every day | ORAL | Status: DC
  Administered 2023-02-01 – 2023-02-03 (×3): 1 via ORAL
  Filled 2023-01-31 (×3): qty 1

## 2023-01-31 MED ORDER — FUROSEMIDE 20 MG PO TABS
40.0000 mg | ORAL_TABLET | Freq: Two times a day (BID) | ORAL | Status: DC
  Administered 2023-02-01 – 2023-02-03 (×5): 40 mg via ORAL
  Filled 2023-01-31 (×5): qty 2

## 2023-01-31 MED ORDER — GADOBUTROL 1 MMOL/ML IV SOLN
8.0000 mL | Freq: Once | INTRAVENOUS | Status: AC | PRN
  Administered 2023-01-31: 8 mL via INTRAVENOUS

## 2023-01-31 MED ORDER — MAGNESIUM HYDROXIDE 400 MG/5ML PO SUSP: 30.0000 mL | Freq: Every day | ORAL | Status: DC | PRN

## 2023-01-31 MED ORDER — SODIUM CHLORIDE 0.9 % IV SOLN: INTRAVENOUS | Status: DC

## 2023-01-31 MED ORDER — ACETAMINOPHEN 650 MG RE SUPP: 650.0000 mg | Freq: Four times a day (QID) | RECTAL | Status: DC | PRN

## 2023-01-31 MED ORDER — VITAMIN D 25 MCG (1000 UNIT) PO TABS
1000.0000 [IU] | ORAL_TABLET | Freq: Every day | ORAL | Status: DC
  Administered 2023-02-01 – 2023-02-03 (×3): 1000 [IU] via ORAL
  Filled 2023-01-31 (×3): qty 1

## 2023-01-31 MED ORDER — MESALAMINE ER 0.375 G PO CP24
0.3750 g | ORAL_CAPSULE | Freq: Four times a day (QID) | ORAL | Status: DC
  Administered 2023-02-01 – 2023-02-03 (×9): 0.375 g via ORAL
  Filled 2023-01-31 (×14): qty 1

## 2023-01-31 MED ORDER — FESOTERODINE FUMARATE ER 4 MG PO TB24: 4.0000 mg | ORAL_TABLET | Freq: Every day | ORAL | Status: DC

## 2023-01-31 MED ORDER — FOLIC ACID 1 MG PO TABS
1.0000 mg | ORAL_TABLET | Freq: Every day | ORAL | Status: DC
  Administered 2023-02-01 – 2023-02-03 (×3): 1 mg via ORAL
  Filled 2023-01-31 (×4): qty 1

## 2023-01-31 MED ORDER — DANTROLENE SODIUM 25 MG PO CAPS
50.0000 mg | ORAL_CAPSULE | Freq: Three times a day (TID) | ORAL | Status: DC
  Administered 2023-01-31 – 2023-02-03 (×8): 50 mg via ORAL
  Filled 2023-01-31 (×12): qty 2

## 2023-01-31 MED ORDER — DIAZEPAM 5 MG PO TABS: 5.0000 mg | ORAL_TABLET | Freq: Every evening | ORAL | Status: DC | PRN

## 2023-01-31 MED ORDER — TRAZODONE HCL 50 MG PO TABS: 25.0000 mg | ORAL_TABLET | Freq: Every evening | ORAL | Status: DC | PRN

## 2023-01-31 MED ORDER — ACETAMINOPHEN 325 MG PO TABS
650.0000 mg | ORAL_TABLET | Freq: Four times a day (QID) | ORAL | Status: DC | PRN
  Administered 2023-02-02 (×3): 650 mg via ORAL
  Filled 2023-01-31 (×3): qty 2

## 2023-01-31 MED ORDER — FAMOTIDINE 20 MG PO TABS
40.0000 mg | ORAL_TABLET | Freq: Two times a day (BID) | ORAL | Status: DC
  Administered 2023-01-31 – 2023-02-03 (×6): 40 mg via ORAL
  Filled 2023-01-31 (×6): qty 2

## 2023-01-31 MED ORDER — ASPIRIN 81 MG PO TBEC
81.0000 mg | DELAYED_RELEASE_TABLET | Freq: Every day | ORAL | Status: DC
  Administered 2023-02-01 – 2023-02-03 (×3): 81 mg via ORAL
  Filled 2023-01-31 (×3): qty 1

## 2023-01-31 MED ORDER — DIAZEPAM 5 MG/ML IJ SOLN: 2.5000 mg | Freq: Once | INTRAMUSCULAR | Status: DC | PRN

## 2023-01-31 MED ORDER — ENOXAPARIN SODIUM 40 MG/0.4ML IJ SOSY
40.0000 mg | PREFILLED_SYRINGE | INTRAMUSCULAR | Status: DC
  Administered 2023-01-31 – 2023-02-02 (×3): 40 mg via SUBCUTANEOUS
  Filled 2023-01-31 (×3): qty 0.4

## 2023-01-31 MED ORDER — QUETIAPINE FUMARATE 25 MG PO TABS
25.0000 mg | ORAL_TABLET | Freq: Every day | ORAL | Status: DC
  Administered 2023-01-31 – 2023-02-02 (×3): 25 mg via ORAL
  Filled 2023-01-31 (×3): qty 1

## 2023-01-31 MED ORDER — BACLOFEN 10 MG PO TABS
15.0000 mg | ORAL_TABLET | Freq: Four times a day (QID) | ORAL | Status: DC
  Administered 2023-01-31 – 2023-02-03 (×10): 15 mg via ORAL
  Filled 2023-01-31 (×10): qty 2

## 2023-01-31 MED ORDER — SALINE SPRAY 0.65 % NA SOLN: 1.0000 | NASAL | Status: DC | PRN

## 2023-01-31 MED ORDER — ADULT MULTIVITAMIN W/MINERALS CH
1.0000 | ORAL_TABLET | Freq: Every day | ORAL | Status: DC
  Administered 2023-02-01 – 2023-02-03 (×3): 1 via ORAL
  Filled 2023-01-31 (×3): qty 1

## 2023-01-31 MED ORDER — FLUOXETINE HCL 20 MG PO CAPS
20.0000 mg | ORAL_CAPSULE | Freq: Three times a day (TID) | ORAL | Status: DC
  Administered 2023-01-31 – 2023-02-03 (×8): 20 mg via ORAL
  Filled 2023-01-31 (×8): qty 1

## 2023-01-31 MED ORDER — ONDANSETRON HCL 4 MG PO TABS: 4.0000 mg | ORAL_TABLET | Freq: Four times a day (QID) | ORAL | Status: DC | PRN

## 2023-01-31 MED ORDER — OXCARBAZEPINE 150 MG PO TABS
150.0000 mg | ORAL_TABLET | Freq: Three times a day (TID) | ORAL | Status: DC
  Administered 2023-01-31 – 2023-02-03 (×8): 150 mg via ORAL
  Filled 2023-01-31 (×11): qty 1

## 2023-01-31 MED ORDER — ROSUVASTATIN CALCIUM 20 MG PO TABS
40.0000 mg | ORAL_TABLET | Freq: Every day | ORAL | Status: DC
  Administered 2023-02-01: 40 mg via ORAL
  Filled 2023-01-31: qty 2

## 2023-01-31 MED ORDER — ONDANSETRON HCL 4 MG/2ML IJ SOLN: 4.0000 mg | Freq: Four times a day (QID) | INTRAMUSCULAR | Status: DC | PRN

## 2023-01-31 MED ORDER — SODIUM CHLORIDE 0.9 % IV SOLN
1.0000 g | INTRAVENOUS | Status: DC
  Administered 2023-01-31 – 2023-02-02 (×3): 1 g via INTRAVENOUS
  Filled 2023-01-31 (×3): qty 10

## 2023-01-31 MED ORDER — LABETALOL HCL 100 MG PO TABS
50.0000 mg | ORAL_TABLET | Freq: Every day | ORAL | Status: DC
  Administered 2023-02-01 – 2023-02-03 (×3): 50 mg via ORAL
  Filled 2023-01-31 (×3): qty 0.5

## 2023-01-31 NOTE — Assessment & Plan Note (Signed)
-   This is suspected based on her pyuria and pending urine culture and sensitivity confirmation. - We will place on IV Rocephin and follow urine culture.

## 2023-01-31 NOTE — Assessment & Plan Note (Signed)
-   We will continue her baclofen at a lower dose as mentioned above, her dantrolene, Valium, Trileptal and teriflunomide. - PT consult will be obtained.

## 2023-01-31 NOTE — Consult Note (Signed)
Neurology Consultation    Reason for Consult: Secondary progressive MS  CC: confusion, staring spells  HISTORY OF PRESENT ILLNESS   HPI   Vanessa Short is a 57 y.o. female with a past medical history of multiple sclerosis, elevated GC levels, spastic quadriparesis, depression, ulcerative colitis, heart failure as well as history of delirium associated with urinary tract infections who presents with confusion. She was seen on 6/14 in the ED for episodes of behavioral arrest and hallucinations and she was seen by neurology then. Her husband states that her staring spells have worsened but she does not seem to be having hallucinations. She was prescribed Flagyl, doxycycline, and cefdinir for an abscess in her nares that was MRSA positive.  These were then de-escalated to just doxycycline and mupirocin cream.  She is currently on acyclovir, baclofen, dantrolene, diazepam, Trileptal, teriflunomide for her MS. Additionally she is on 20mg  of baclofen QID, seroquel 25mg  daily, prozac 50mg  daily, oxcarbazepine 150mg  TID, valium 5mg  daily. During last admission EEG was negative for seizure activity. Video of spells appear to show her falling asleep during conversation with roving eye movements, which is similar to our examination. No twitching noted in video or during exam. She arouses for brief periods prior to falling back to sleep. BP has been stable during spells and she is protecting her airway.   History is obtained from: Husband at bedside and chart review  ROS:   PAST MEDICAL HISTORY    Past Medical History:  Past Medical History:  Diagnosis Date   Headache    Herpes genitalis in women    History of kidney stones    Hypertension    Kidney stones    Movement disorder    Multiple sclerosis (HCC)    Neuropathy    Oral herpes    Ulcerative colitis (HCC)    Vision abnormalities     No family history on file. Family History  Problem Relation Age of Onset   Dementia Mother     Hypertension Father    Hyperlipidemia Father    Diabetes Father    Heart disease Father    Breast cancer Neg Hx     Allergies:  Allergies  Allergen Reactions   Nitrofurantoin Other (See Comments)    syncope   Oxycodone-Acetaminophen Other (See Comments)    Other   Risperidone And Related     Became very lethargic   Penicillins Rash    Has patient had a PCN reaction causing immediate rash, facial/tongue/throat swelling, SOB or lightheadedness with hypotension: NO Has patient had a PCN reaction causing severe rash involving mucus membranes or skin necrosis: NO Has patient had a PCN reaction that required hospitalization NO Has patient had a PCN reaction occurring within the last 10 years:NO If all of the above answers are "NO", then may proceed with Cephalosporin use.    Social History:   reports that she has never smoked. She has never used smokeless tobacco. She reports current alcohol use. She reports that she does not use drugs.    Medications (Not in a hospital admission)   EXAMINATION    Current vital signs:    01/31/2023    2:45 PM 01/31/2023   11:29 AM 01/31/2023   11:18 AM  Vitals with BMI  Height  5\' 3"    Weight  180 lbs   BMI  31.89   Systolic 115  119  Diastolic 64  67  Pulse 79  80    Examination:  GENERAL: Awake, falls  asleep throughout exam, alert in NAD HEENT: - Normocephalic and atraumatic, dry mm, no lymphadenopathy, no Thyromegally LUNGS - Regular, unlabored respirations CV - S1S2 RRR, equal pulses bilaterally. ABDOMEN - Soft, nontender, nondistended with normoactive BS Ext: warm, well perfused, intact peripheral pulses, no pedal edema Integumentary:  Skin intact on clothed exam  NEURO:  Mental Status: Awakens to voice, falls asleep throughout exam. Oriented to self, place, time, situation Language: speech is clear, slow, soft.  Intact naming, repetition, fluency, and comprehension. Cranial Nerves:  II: PERRL. Visual fields full to  confrontation.  III, IV, VI: EOM intact. Eyelids elevate symmetrically. Blinks to threat.  V: facial sensation intact VII: no facial asymmetry   VIII: hearing intact to voice IX, X: Palate elevates symmetrically. Voice is soft [id number] shrug symmetrical  XII: tongue is midline without fasciculations. Motor:  RUE:  2-3/5    hand is rigid, does not straighten fingers to command  LUE: 1/5 RLE: 0/5        LLE: 0/5  Sensation- Intact to light touch bilaterally Coordination and gait: Unable to perform    LABS   I have reviewed labs in epic and the results pertinent to this consultation are:   Lab Results  Component Value Date   LDLCALC 48 01/10/2023   Lab Results  Component Value Date   ALT 39 01/26/2023   AST 42 (H) 01/26/2023   ALKPHOS 114 01/26/2023   BILITOT 0.9 01/26/2023   Lab Results  Component Value Date   HGBA1C 5.5 11/29/2022   Lab Results  Component Value Date   WBC 10.8 (H) 01/31/2023   HGB 12.3 01/31/2023   HCT 40.2 01/31/2023   MCV 95.5 01/31/2023   PLT 154 01/31/2023   Lab Results  Component Value Date   VITAMINB12 641 07/29/2018   No results found for: "FOLATE" Lab Results  Component Value Date   NA 145 01/31/2023   K 4.3 01/31/2023   CL 109 01/31/2023   CO2 25 01/31/2023   Urinalysis    Component Value Date/Time   COLORURINE YELLOW 01/31/2023 1651   APPEARANCEUR CLOUDY (A) 01/31/2023 1651   LABSPEC 1.010 01/31/2023 1651   PHURINE 5.0 01/31/2023 1651   GLUCOSEU NEGATIVE 01/31/2023 1651   HGBUR NEGATIVE 01/31/2023 1651   BILIRUBINUR NEGATIVE 01/31/2023 1651   KETONESUR NEGATIVE 01/31/2023 1651   PROTEINUR NEGATIVE 01/31/2023 1651   UROBILINOGEN 0.2 05/31/2013 0237   NITRITE NEGATIVE 01/31/2023 1651   LEUKOCYTESUR SMALL (A) 01/31/2023 1651       DIAGNOSTIC IMAGING/PROCEDURES   I have reviewed the images obtained:, as below    MRI brain w wo - pending   ASSESSMENT/PLAN    Assessment:  57 y.o. female with a pertinent  medical history of  presenting with past medical history of multiple sclerosis as well as history of delirium associated with urinary tract infections who presents with confusion. She was seen on 6/14 in the ED for episodes of behavioral arrest and hallucinations and she was seen by neurology then. Her husband states that her staring spells have worsened but she does not seem to be having hallucinations. WBC is currently 10.8. UA is pending. She is currently receiving 20mg  of baclofen QID, seroquel 25mg  daily, oxcarbazepine 150mg  TID, prozac 50mg  daily, valium 5mg  daily. As she has aged and her kidney function has decreased she may not be metabolizing her medications as well which may contribute to her sedation and delirium. We may consider decreasing some of these medications to see if it improves  her mental status. The video of her spells is concerning for delirium and less suspicious for seizure activity as she is able to be aroused throughout the entire episode. No twitching or abnormal movements noted.  Query whether in the setting of age, advancing multiple sclerosis and worsening renal function she may need less sedating medications and her improvement in the hospital may be secondary to many of these medications being held or reduced while inpatient  Impression: Toxic metabolic encephalopathy causing delirium  Polypharmacy   Recommendations: - MRI brain w wo pending  - Urinalysis is pending- UTI treatment per primary team, noting that she does have a suprapubic catheter - Delirium precautions  - Oxcarbazepine level, will take some time to result continue current dosing for now - Consider reduction of medications that may be causing delirium and sedation; could start by reducing baclofen to 15 mg QID  - Neurology will follow along  Elmer Picker, NP  --  Attending Neurologist's note:  I personally saw this patient, gathering history, performing a full neurologic examination, reviewing  relevant labs, and formulated the assessment and plan, adding the note above for completeness and clarity to accurately reflect my thoughts  Brooke Dare MD-PhD [facility] [phone] Available 7 AM to 7 PM, outside these hours please contact Neurologist on call listed on AMION

## 2023-01-31 NOTE — Assessment & Plan Note (Signed)
-   We will continue her mesalamine.

## 2023-01-31 NOTE — ED Provider Notes (Signed)
7:33 PM Assumed care of patient from off-going team. For more details, please see note from same day.  In brief, this is a 57 y.o. female who admitted for encephalopathy/MS which improved after DC, but yesterday became very sleepy, had more visual/auditory hallucinations at home and this AM very lethargic  Plan/Dispo at time of sign-out & ED Course since sign-out: [ ]  neuro coming [ ]  MRI brain [ ]  admit to hospitalist  BP (!) 133/112   Pulse 89   Temp 98 F (36.7 C) (Oral)   Resp 16   Ht 5\' 3"  (1.6 m)   Wt 81.6 kg   SpO2 95%   BMI 31.89 kg/m    ED Course:   Clinical Course as of 01/31/23 1933  Wed Jan 31, 2023  1855 Urinalysis, Routine w reflex microscopic -Urine, Clean Catch(!) Unlikely a UTI given 11-20 squamous cells. Will draw culture. [HN]  1855 Ammonia: <10 neg [HN]  1855 T4,Free(Direct): 0.75 wnl [HN]  1855 Basic metabolic panel(!) unremarkable [HN]  1855 CBC(!) unremarkable [HN]  1855 TSH: 3.649 wnl [HN]  1855 I-Stat venous blood gas, ([facility], [facility], [facility])(!) VBG unremarkable [HN]  1856 DG Chest Portable 1 View Stable chest port.  Enlarged heart.  Film is rotated to the left.  Question small left effusion   [HN]  1911 MR Brain W and Wo Contrast 1. No acute intracranial process. 2. Redemonstrated extensive demyelinating lesions in the bilateral cerebral hemispheres. No evidence of diffusion restriction or contrast enhancement to suggest active demyelination. Plaque burden is severe, but similar to prior exam from 2022.   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 Neurology following. [HN]    Clinical Course User Index [HN] Loetta Rough, MD   ------------------------------- Vivi Barrack, MD Emergency Medicine  This note was created using dictation software, which may contain spelling or grammatical errors.   Loetta Rough, MD 01/31/23 [phone]

## 2023-01-31 NOTE — Plan of Care (Signed)

## 2023-01-31 NOTE — Telephone Encounter (Addendum)
Called husband back. Relayed I spoke w/ Dr. Epimenio Foot and he wants him to bring her back to ER immediatly for re-evaluation/treatment. He verbalized understanding.

## 2023-01-31 NOTE — Assessment & Plan Note (Signed)
-   This could certainly be contributing to her symptoms. - Will involve RT and assessing the possibility for CPAP specially given her upper extremity weakness.

## 2023-01-31 NOTE — Assessment & Plan Note (Addendum)
-   This is treated with delirium with psychotic features as manifested by visual and auditory hallucination. - This could be partly related to her UTI and partly polypharmacy. - She will be admitted to observation medical telemetry bed. - We will follow neurochecks every 4 hours for 24 hours. - We will place her on IV Rocephin and follow urine cultures and sensitivity. - Mental status will be followed. - She was seen by neurology in the ED. - We will cut down her baclofen dose. - We will continue her Seroquel and placed on as needed Geodon.

## 2023-01-31 NOTE — Assessment & Plan Note (Signed)
-   We will continue statin therapy. 

## 2023-01-31 NOTE — Telephone Encounter (Signed)
Pt's husband called stating that the pt was in the hospital for 3 days this past weekend and now she is starting to show some of the symptoms again like the blank stare and when spoken to she is not reacting. Husband is wanting to know if he can be advised if he should schedule an appt for her to bee seen here or if something else can be done. Please advise.

## 2023-01-31 NOTE — ED Provider Notes (Signed)
[place] EMERGENCY DEPARTMENT AT [facility] Provider Note   CSN: [id number] Arrival date & time: 01/31/23  1102     History  Chief Complaint  Patient presents with   Fatigue   Hallucinations    Vanessa Short is a 57 y.o. female.  HPI     57 year old female with a history of hypertension, MS, ulcerative colitis, evaluation June 8 with concern for abscess of her right Neer's, return June 10 for hallucinations, admission June 13 to 16 with concern for encephalopathy, possible seizure-like activity who presents with concern for sleepiness, increased hallucinations last night, fatigue, altered mental status today.   With history of MS, prior fall with fx which set her  back, she is mostly bedbound, try to get more mobile, on home medications, had improved from her  right nasal nare infection.  She had an overnight EEG during her hospitalization which was not acute, had history of prior periodic mild delirium and known MS, thought that encephalopathy might of been secondary to the nasal infection or interaction with 1 antibiotics.  She was seen by neurology and psychiatry.  Improved while she was in the hospital and first day home.  Yesterday was sleepy, slept most of the day, last night began to have hallucinations again---woke up in middle of the night, agitated like she was seeing and hearing things-looking around.  This morning, was sleepy, altered, would answer quick questions with yes but would then appear out of it like falling asleep.   No known fever, headache, chest pain, dyspnea, bough, nausea, vomiting> Has had some looser stool --thinks from abx. No other abd pain.   Has not had medication changes with exception of recent abx, mS medication one month ago  Home Medications Prior to Admission medications   Medication Sig Start Date End Date Taking? Authorizing Provider  acetaminophen (TYLENOL) 500 MG tablet Take by mouth at bedtime.    [provider]   acyclovir (ZOVIRAX) 400 MG tablet Take 400 mg by mouth daily. Take every day per patient 08/11/14   [provider]  aspirin EC 81 MG tablet Take 81 mg by mouth daily.    [provider]  b complex vitamins tablet Take 1 tablet by mouth daily.     [provider]  baclofen (LIORESAL) 20 MG tablet One po qid 01/09/23   Sater, Pearletha Furl, MD  cholecalciferol (VITAMIN D) 1000 UNITS tablet Take 1,000 Units by mouth daily.     [provider]  dantrolene (DANTRIUM) 50 MG capsule Take 1 capsule (50 mg total) by mouth in the morning, at noon, in the evening, and at bedtime. 12/04/22   Sater, Pearletha Furl, MD  diazepam (VALIUM) 5 MG tablet One po qHS 10/17/22   Sater, Pearletha Furl, MD  doxycycline (VIBRAMYCIN) 100 MG capsule Take 1 capsule (100 mg total) by mouth 2 (two) times daily. 01/28/23   Leroy Sea, MD  famotidine (PEPCID) 20 MG tablet Take 40 mg by mouth 2 (two) times daily.    [provider]  FLUoxetine (PROZAC) 20 MG capsule Take 20 mg by mouth 3 (three) times daily.    [provider]  folic acid (FOLVITE) 400 MCG tablet Take 800 mcg by mouth daily.    [provider]  furosemide (LASIX) 40 MG tablet Take 1 tablet (40 mg total) by mouth 2 (two) times daily. 08/19/16   Rai, Ripudeep K, MD  labetalol (NORMODYNE) 100 MG tablet TAKE 1/2 TABLET BY MOUTH DAILY  01/10/23   Sater, Pearletha Furl, MD  lidocaine-prilocaine (EMLA) cream Apply one inch before port-a cath access prn 03/23/21   Sater, Pearletha Furl, MD  mesalamine (APRISO) 0.375 g 24 hr capsule Take 375 mg by mouth QID.    [provider]  Multiple Vitamin (MULTIVITAMIN) tablet Take 1 tablet by mouth daily.    [provider]  mupirocin ointment (BACTROBAN) 2 % Apply 1 Application topically 2 (two) times daily. 01/28/23   Leroy Sea, MD  OXcarbazepine (TRILEPTAL) 150 MG tablet Take 1 tablet (150 mg total) by mouth 3 (three) times daily. 12/04/22   Sater, Pearletha Furl, MD   potassium chloride (KLOR-CON) 10 MEQ tablet Take 20 mEq by mouth daily. 08/13/21   [provider]  QUEtiapine (SEROQUEL) 25 MG tablet Take 1 tablet (25 mg total) by mouth at bedtime. 07/31/18   Howard Pouch, MD  rosuvastatin (CRESTOR) 40 MG tablet Take 40 mg by mouth daily.    [provider]  sodium chloride (OCEAN) 0.65 % SOLN nasal spray Place 1 spray into both nostrils as needed for congestion. 08/19/16   Rai, Ripudeep Kirtland Bouchard, MD  Teriflunomide 14 MG TABS Take 1 tablet (14 mg total) by mouth daily. 12/06/22   Sater, Pearletha Furl, MD  tolterodine (DETROL) 2 MG tablet Take 2 mg by mouth 2 (two) times daily.  05/09/18   [provider]      Allergies    Nitrofurantoin, Oxycodone-acetaminophen, Risperidone and related, and Penicillins    Review of Systems   Review of Systems  Physical Exam Updated Vital Signs BP (!) 133/112   Pulse 89   Temp 98 F (36.7 C) (Oral)   Resp 16   Ht 5\' 3"  (1.6 m)   Wt 81.6 kg   SpO2 95%   BMI 31.89 kg/m  Physical Exam Vitals and nursing note reviewed.  Constitutional:      General: She is not in acute distress.    Appearance: She is well-developed. She is ill-appearing. She is not diaphoretic.  HENT:     Head: Normocephalic and atraumatic.  Eyes:     Conjunctiva/sclera: Conjunctivae normal.  Cardiovascular:     Rate and Rhythm: Normal rate and regular rhythm.     Heart sounds: Normal heart sounds. No murmur heard.    No friction rub. No gallop.  Pulmonary:     Effort: Pulmonary effort is normal. No respiratory distress.     Breath sounds: Normal breath sounds. No wheezing or rales.  Abdominal:     General: There is no distension.     Palpations: Abdomen is soft.     Tenderness: There is no abdominal tenderness. There is no guarding.  Musculoskeletal:        General: No tenderness.     Cervical back: Normal range of motion.  Skin:    General: Skin is warm and dry.     Findings: No erythema or rash.  Neurological:      Mental Status: She is alert and oriented to person, place, and time.     Comments: Quadraplegia, right arm with more strength, no facial droop     ED Results / Procedures / Treatments   Labs (all labs ordered are listed, but only abnormal results are displayed) Labs Reviewed  BASIC METABOLIC PANEL - Abnormal; Notable for the following components:      Result Value   Glucose, Bld 123 (*)    BUN 33 (*)    Creatinine, Ser 1.27 (*)  GFR, Estimated 49 (*)    All other components within normal limits  CBC - Abnormal; Notable for the following components:   WBC 10.8 (*)    RDW 16.6 (*)    All other components within normal limits  URINALYSIS, ROUTINE W REFLEX MICROSCOPIC - Abnormal; Notable for the following components:   APPearance CLOUDY (*)    Leukocytes,Ua SMALL (*)    Bacteria, UA RARE (*)    All other components within normal limits  DIFFERENTIAL - Abnormal; Notable for the following components:   Monocytes Absolute 1.2 (*)    All other components within normal limits  HEPATIC FUNCTION PANEL - Abnormal; Notable for the following components:   Total Protein 6.3 (*)    Albumin 3.2 (*)    AST 64 (*)    ALT 83 (*)    Alkaline Phosphatase 142 (*)    All other components within normal limits  CBC - Abnormal; Notable for the following components:   RDW 16.6 (*)    All other components within normal limits  CBG MONITORING, ED - Abnormal; Notable for the following components:   Glucose-Capillary 123 (*)    All other components within normal limits  I-STAT BETA HCG BLOOD, ED ([facility], [facility], [facility] ONLY) - Abnormal; Notable for the following components:   I-stat hCG, quantitative 13.5 (*)    All other components within normal limits  I-STAT VENOUS BLOOD GAS, ED - Abnormal; Notable for the following components:   pO2, Ven 104 (*)    Calcium, Ion 1.01 (*)    All other components within normal limits  AMMONIA  TSH  T4, FREE  RAPID URINE DRUG SCREEN, HOSP PERFORMED  10-HYDROXYCARBAZEPINE     EKG EKG Interpretation  Date/Time:  Wednesday January 31 2023 11:59:19 EDT Ventricular Rate:  75 PR Interval:  154 QRS Duration: 85 QT Interval:  390 QTC Calculation: 436 R Axis:   36 Text Interpretation: Sinus rhythm Low voltage, precordial leads No significant change since last tracing Confirmed by Alvira Monday ([id number]) on 01/31/2023 5:56:16 PM  Radiology DG Chest Portable 1 View  Result Date: 01/31/2023 CLINICAL DATA:  Altered mental status EXAM: PORTABLE CHEST 1 VIEW COMPARISON:  X-ray 01/25/2023 FINDINGS: Stable right IJ chest port with tip along the central SVC. Film is rotated to the left. Enlarged cardiopericardial silhouette. Calcified aorta. No pneumothorax or edema. Small left effusion. Osteopenia. Overlapping cardiac leads. IMPRESSION: Stable chest port.  Enlarged heart.  Film is rotated to the left. Question small left effusion Electronically Signed   By: Karen Kays M.D.   On: 01/31/2023 16:15    Procedures Procedures    Medications Ordered in ED Medications  diazepam (VALIUM) injection 2.5-5 mg (has no administration in time range)    ED Course/ Medical Decision Making/ A&P                              57 year old female with a history of hypertension, MS, ulcerative colitis, evaluation June 8 with concern for abscess of her right Neer's, return June 10 for hallucinations, admission June 13 to 16 with concern for encephalopathy, possible seizure-like activity who presents with concern for sleepiness, increased hallucinations last night, fatigue, altered mental status today.  DDx includes ICH, MS exacerbation, CVA, seizures, toxic encephalopathy/medication effect, hepatic encephalopathy, other metabolic encephalopathy, infection/delirium.  Labs ordered and personally evaluated by me shows no anemia, mild leukocytosis, no significant electrolyte abnormality.  EKG personally evaluated and interpreted by  me shows no acute findings.  Discussed ordering head CT,  however husband reports this was done recently--on chart review it does appear it was 6/8 maxillofacial CT visualized brain without acute findings-does not have headache, had recent admission for similar symptoms and improved prior to discharge -- in this setting without headache do feel it is reasonable to wait for MRI brian [facility] to have mores sensitive evaluation. Consider medications as etiology.  Labs pending include VBG, thyroid studies, ammonia, hepatic panel, UA.  MRI brain [facility] pending.  Neurology consulted. Care signed out with imaging and labs pending.        Final Clinical Impression(s) / ED Diagnoses Final diagnoses:  Hallucinations  Other fatigue  Encephalopathy    Rx / DC Orders ED Discharge Orders     None         Alvira Monday, MD 01/31/23 [phone]

## 2023-01-31 NOTE — ED Triage Notes (Signed)
Pt presents for fatigue and AMS.  Pt was recently admitted for same.  Husband reports Pt was hallucinating last night and has been unable to answer questions.  Husband reports Pt has had multiple tests and everything has been inconclusive.       Husband reports Pt is normally A&Ox4.

## 2023-01-31 NOTE — Telephone Encounter (Signed)
Called husband. Wife dc'd from [facility] hospital 01/28/23. Had EEG/CT/labs. She was ok Sunday. Slept a lot Monday. THey were able to get her showered. Yesterday, slept a lot. Last night around 1:30am, yelled out to son and said "there are two guys out there". They did watch [employer] show before this and thought it was from watching that. But believes she is having hallucinations again.   This am, when he talked to her, she will look at him and "drift away". Does not answer questions. Just saying yes to yes/no questions. Having hard time verbalizing. In last ten min, speaking a little more but only for 1-2 seconds.   New med started for abscess on nose: Doxycycline. (Has had diarrhea from this).   No signs/sx of UTI currently. Aware I will speak with MD and call back.  I did let husband know if her sx decline rapidly, he should bring her back to ER for immediate evaluation.

## 2023-01-31 NOTE — H&P (Signed)
[place]   PATIENT NAME: Vanessa Short    MR#:  [id number]  DATE OF BIRTH:  January 25, 1966  DATE OF ADMISSION:  01/31/2023  PRIMARY CARE PHYSICIAN: Richmond Campbell., [name]   Patient is coming from: Home  REQUESTING/REFERRING PHYSICIAN: Loetta Rough, MD    CHIEF COMPLAINT:   Chief Complaint  Patient presents with   Fatigue   Hallucinations    HISTORY OF PRESENT ILLNESS:  Shanan A Fadley is a 57 y.o. Caucasian female with medical history significant for multiple sclerosis, hypertension, peripheral neuropathy, ulcerative colitis and urolithiasis, who presented to the emergency room with acute onset of altered mental status with excessive sleepiness, increased visual and auditory hallucinations at home and significant lethargy this morning.  Her husband stated that she slept all day yesterday and at 1 AM he thought she was having visual hallucinations.  She asked him to stay awake as she was apparently seeing "black people in the house".  He also mentioned that he he noticed that that she would take 4-5 big breath and stops breathing for 45 seconds.  No new paresthesias or focal muscle weakness.  No new headache or dizziness or blurred vision.  No chest pain or palpitations.  No cough or wheezing or dyspnea.  She has a suprapubic catheter with no significant urinary symptoms.  No urinary or stool incontinence.  No tinnitus or vertigo.  She was recently admitted here from 613 until 616 and managed for toxic encephalopathy and delirium as well as seizure-like activity, with nonacute EEG.  She was recommended mupirocin and oral doxycycline for 7 to 10 days for her nasal MRSA infection and follow-up with ENT.  Her symptoms were thought to be partly related to multiple antibiotics that were started recently.  She is mostly bedbound with her MS with ambulatory dysfunction at baseline with functional paraplegia and minimal upper extremity strength.  ED Course: When she came to the ER,  vital signs were within normal.  Labs revealed unremarkable CBC.  TSH was 3.6 and free T4 was 0.75.  UA was suspicious for UTI with 11-20 WBCs but rare bacteria. EKG as reviewed by me :  EKG showed normal sinus rhythm with a rate of 75 with low voltage QRS Imaging: Portable chest x-ray showed stable chest port, cardiomegaly and failure rotation to the left with questionable small left pleural effusion.  A brain MRI with and without contrast showed the following: 1. No acute intracranial process. 2. Redemonstrated extensive demyelinating lesions in the bilateral cerebral hemispheres. No evidence of diffusion restriction or contrast enhancement to suggest active demyelination. Plaque burden is severe, but similar to prior exam from 2022.  She will be admitted to an observation medical telemetry bed for further evaluation and management. PAST MEDICAL HISTORY:   Past Medical History:  Diagnosis Date   Headache    Herpes genitalis in women    History of kidney stones    Hypertension    Kidney stones    Movement disorder    Multiple sclerosis (HCC)    Neuropathy    Oral herpes    Ulcerative colitis (HCC)    Vision abnormalities     PAST SURGICAL HISTORY:   Past Surgical History:  Procedure Laterality Date   ENDOMETRIAL ABLATION  10/10/2017   KIDNEY STONE SURGERY     PORTACATH PLACEMENT N/A 10/16/2017   Procedure: ULTRA SOUND GUIDED INSERTION PORT-A-CATH ERAS PATHWAY;  Surgeon: Rodman Pickle, MD;  Location: [facility];  Service: General;  Laterality: N/A;    SOCIAL HISTORY:   Social History   Tobacco Use   Smoking status: Never   Smokeless tobacco: Never  Substance Use Topics   Alcohol use: Yes    Comment: occasional glass of wine    FAMILY HISTORY:   Family History  Problem Relation Age of Onset   Dementia Mother    Hypertension Father    Hyperlipidemia Father    Diabetes Father    Heart disease Father    Breast cancer Neg Hx     DRUG ALLERGIES:   Allergies   Allergen Reactions   Nitrofurantoin Other (See Comments)    syncope   Oxycodone-Acetaminophen Other (See Comments)    Other   Risperidone And Related     Became very lethargic   Penicillins Rash    Has patient had a PCN reaction causing immediate rash, facial/tongue/throat swelling, SOB or lightheadedness with hypotension: NO Has patient had a PCN reaction causing severe rash involving mucus membranes or skin necrosis: NO Has patient had a PCN reaction that required hospitalization NO Has patient had a PCN reaction occurring within the last 10 years:NO If all of the above answers are "NO", then may proceed with Cephalosporin use.    REVIEW OF SYSTEMS:   ROS As per history of present illness. All pertinent systems were reviewed above. Constitutional, HEENT, cardiovascular, respiratory, GI, GU, musculoskeletal, neuro, psychiatric, endocrine, integumentary and hematologic systems were reviewed and are otherwise negative/unremarkable except for positive findings mentioned above in the HPI.   MEDICATIONS AT HOME:   Prior to Admission medications   Medication Sig Start Date End Date Taking? Authorizing Provider  acetaminophen (TYLENOL) 500 MG tablet Take by mouth at bedtime.    [provider]  acyclovir (ZOVIRAX) 400 MG tablet Take 400 mg by mouth daily. Take every day per patient 08/11/14   [provider]  aspirin EC 81 MG tablet Take 81 mg by mouth daily.    [provider]  b complex vitamins tablet Take 1 tablet by mouth daily.     [provider]  baclofen (LIORESAL) 20 MG tablet One po qid 01/09/23   Sater, Pearletha Furl, MD  cholecalciferol (VITAMIN D) 1000 UNITS tablet Take 1,000 Units by mouth daily.     [provider]  dantrolene (DANTRIUM) 50 MG capsule Take 1 capsule (50 mg total) by mouth in the morning, at noon, in the evening, and at bedtime. 12/04/22   Sater, Pearletha Furl, MD  diazepam (VALIUM) 5 MG tablet One po qHS 10/17/22   Sater,  Pearletha Furl, MD  doxycycline (VIBRAMYCIN) 100 MG capsule Take 1 capsule (100 mg total) by mouth 2 (two) times daily. 01/28/23   Leroy Sea, MD  famotidine (PEPCID) 20 MG tablet Take 40 mg by mouth 2 (two) times daily.    [provider]  FLUoxetine (PROZAC) 20 MG capsule Take 20 mg by mouth 3 (three) times daily.    [provider]  folic acid (FOLVITE) 400 MCG tablet Take 800 mcg by mouth daily.    [provider]  furosemide (LASIX) 40 MG tablet Take 1 tablet (40 mg total) by mouth 2 (two) times daily. 08/19/16   Rai, Delene Ruffini, MD  labetalol (NORMODYNE) 100 MG tablet TAKE 1/2 TABLET BY MOUTH DAILY 01/10/23   Sater, Pearletha Furl, MD  lidocaine-prilocaine (EMLA) cream Apply one inch before port-a cath access prn 03/23/21   Sater, Pearletha Furl, MD  mesalamine (APRISO) 0.375 g 24 hr capsule  Take 375 mg by mouth QID.    [provider]  Multiple Vitamin (MULTIVITAMIN) tablet Take 1 tablet by mouth daily.    [provider]  mupirocin ointment (BACTROBAN) 2 % Apply 1 Application topically 2 (two) times daily. 01/28/23   Leroy Sea, MD  OXcarbazepine (TRILEPTAL) 150 MG tablet Take 1 tablet (150 mg total) by mouth 3 (three) times daily. 12/04/22   Sater, Pearletha Furl, MD  potassium chloride (KLOR-CON) 10 MEQ tablet Take 20 mEq by mouth daily. 08/13/21   [provider]  QUEtiapine (SEROQUEL) 25 MG tablet Take 1 tablet (25 mg total) by mouth at bedtime. 07/31/18   Howard Pouch, MD  rosuvastatin (CRESTOR) 40 MG tablet Take 40 mg by mouth daily.    [provider]  sodium chloride (OCEAN) 0.65 % SOLN nasal spray Place 1 spray into both nostrils as needed for congestion. 08/19/16   Rai, Ripudeep Kirtland Bouchard, MD  Teriflunomide 14 MG TABS Take 1 tablet (14 mg total) by mouth daily. 12/06/22   Sater, Pearletha Furl, MD  tolterodine (DETROL) 2 MG tablet Take 2 mg by mouth 2 (two) times daily.  05/09/18   [provider]      VITAL SIGNS:  Blood pressure  128/61, pulse 86, temperature 97.8 F (36.6 C), temperature source Oral, resp. rate 12, height 5\' 3"  (1.6 m), weight 81.6 kg, SpO2 92 %.  PHYSICAL EXAMINATION:  Physical Exam  GENERAL:  57 y.o.-year-old Caucasian female patient lying in the bed with no acute distress.  EYES: Pupils equal, round, reactive to light and accommodation. No scleral icterus. Extraocular muscles intact.  HEENT: Head atraumatic, normocephalic. Oropharynx and nasopharynx clear.  NECK:  Supple, no jugular venous distention. No thyroid enlargement, no tenderness.  LUNGS: Normal breath sounds bilaterally, no wheezing, rales,rhonchi or crepitation. No use of accessory muscles of respiration.  CARDIOVASCULAR: Regular rate and rhythm, S1, S2 normal. No murmurs, rubs, or gallops.  ABDOMEN: Soft, nondistended, nontender. Bowel sounds present. No organomegaly or mass.  EXTREMITIES: No pedal edema, cyanosis, or clubbing.  NEUROLOGIC: Cranial nerves II through XII are intact. Muscle strength 5/5 in all extremities. Sensation intact. Gait not checked.  PSYCHIATRIC: The patient is alert and oriented x 3.  Normal affect and good eye contact. SKIN: No obvious rash, lesion, or ulcer.   LABORATORY PANEL:   CBC Recent Labs  Lab 01/31/23 1535 01/31/23 1652  WBC 8.0  --   HGB 12.7 13.3  HCT 40.8 39.0  PLT 161  --    ------------------------------------------------------------------------------------------------------------------  Chemistries  Recent Labs  Lab 01/28/23 0408 01/31/23 1131 01/31/23 1535 01/31/23 1652  NA 139 145  --  144  K 4.1 4.3  --  3.9  CL 104 109  --   --   CO2 23 25  --   --   GLUCOSE 114* 123*  --   --   BUN 34* 33*  --   --   CREATININE 1.04* 1.27*  --   --   CALCIUM 9.2 9.4  --   --   MG 2.2  --   --   --   AST  --   --  64*  --   ALT  --   --  83*  --   ALKPHOS  --   --  142*  --   BILITOT  --   --  0.6  --     ------------------------------------------------------------------------------------------------------------------  Cardiac Enzymes No results for input(s): "TROPONINI" in the last 168  hours. ------------------------------------------------------------------------------------------------------------------  RADIOLOGY:  MR Brain W and Wo Contrast  Result Date: 01/31/2023 CLINICAL DATA:  Altered mental status, nontraumatic (Ped 0-17y) hallucinations, hx of MS, ?aphasia EXAM: MRI HEAD WITHOUT AND WITH CONTRAST TECHNIQUE: Multiplanar, multiecho pulse sequences of the brain and surrounding structures were obtained without and with intravenous contrast. CONTRAST:  8mL GADAVIST GADOBUTROL 1 MMOL/ML IV SOLN COMPARISON:  MR Head 11/29/20 FINDINGS: Brain: Negative for an acute infarct. No hydrocephalus. No extra-axial fluid collection. No hemorrhage. Redemonstrated extensive demyelinating lesions in the bilateral cerebral hemispheres. No evidence of diffusion restriction or contrast enhancement to suggest demyelination. No evidence of hemorrhage, but assessment of the susceptibility weighted sequences is limited due to motion artifact. Generalized volume loss. Vascular: Normal flow voids. Skull and upper cervical spine: Normal marrow signal. Sinuses/Orbits: No middle ear or mastoid effusion. Paranasal sinuses are clear. Orbits are unremarkable. Other: None. IMPRESSION: 1. No acute intracranial process. 2. Redemonstrated extensive demyelinating lesions in the bilateral cerebral hemispheres. No evidence of diffusion restriction or contrast enhancement to suggest active demyelination. Plaque burden is severe, but similar to prior exam from 2022. Electronically Signed   By: Lorenza Cambridge M.D.   On: 01/31/2023 19:08   DG Chest Portable 1 View  Result Date: 01/31/2023 CLINICAL DATA:  Altered mental status EXAM: PORTABLE CHEST 1 VIEW COMPARISON:  X-ray 01/25/2023 FINDINGS: Stable right IJ chest port with tip along the  central SVC. Film is rotated to the left. Enlarged cardiopericardial silhouette. Calcified aorta. No pneumothorax or edema. Small left effusion. Osteopenia. Overlapping cardiac leads. IMPRESSION: Stable chest port.  Enlarged heart.  Film is rotated to the left. Question small left effusion Electronically Signed   By: Karen Kays M.D.   On: 01/31/2023 16:15      IMPRESSION AND PLAN:  Assessment and Plan: Acute encephalopathy - This is treated with delirium with psychotic features as manifested by visual and auditory hallucination. - This could be partly related to her UTI and partly polypharmacy. - She will be admitted to observation medical telemetry bed. - We will follow neurochecks every 4 hours for 24 hours. - We will place her on IV Rocephin and follow urine cultures and sensitivity. - Mental status will be followed. - She was seen by neurology in the ED. - We will cut down her baclofen dose. - We will continue her Seroquel and placed on as needed Geodon.  Acute lower UTI - This is suspected based on her pyuria and pending urine culture and sensitivity confirmation. - We will place on IV Rocephin and follow urine culture.  Obstructive sleep apnea - This could certainly be contributing to her symptoms. - Will involve RT and assessing the possibility for CPAP specially given her upper extremity weakness.  Dyslipidemia - We will continue statin therapy.  Anxiety and depression - We will can continue her Valium as well as Prozac.  Hypertension - We will continue her antihypertensive therapy.  Ulcerative colitis (HCC) - We will continue her mesalamine.  Multiple sclerosis (HCC) - We will continue her baclofen at a lower dose as mentioned above, her dantrolene, Valium, Trileptal and teriflunomide. - PT consult will be obtained.       DVT prophylaxis: Lovenox.  Advanced Care Planning:  Code Status: full code.  Family Communication:  The plan of care was discussed in  details with the patient (and family). I answered all questions. The patient agreed to proceed with the above mentioned plan. Further management will depend upon hospital course. Disposition Plan: Back to  previous home environment Consults called: Neurology. All the records are reviewed and case discussed with ED provider.  Status is: Observation  I certify that at the time of admission, it is my clinical judgment that the patient will require hospital care extending less than 2 midnights.                            Dispo: The patient is from: Home              Anticipated d/c is to: Home              Patient currently is not medically stable to d/c.              Difficult to place patient: No  Hannah Beat M.D on 01/31/2023 at 9:19 PM  [facility]   From 7 PM-7 AM, contact night-coverage [email or web address]  CC: Primary care physician; Richmond Campbell., [name]

## 2023-01-31 NOTE — ED Notes (Signed)
ED TO INPATIENT HANDOFF REPORT  ED Nurse Name and Phone #:  Johnna Acosta [phone]  S Name/Age/Gender Vanessa Short 57 y.o. female Room/Bed: 027C/027C  Code Status   Code Status: Full Code  Home/SNF/Other Home Patient oriented to: self, place, and time Is this baseline? Yes   Triage Complete: Triage complete  Chief Complaint Acute encephalopathy [G93.40]  Triage Note Pt presents for fatigue and AMS.  Pt was recently admitted for same.  Husband reports Pt was hallucinating last night and has been unable to answer questions.  Husband reports Pt has had multiple tests and everything has been inconclusive.       Husband reports Pt is normally A&Ox4.     Allergies Allergies  Allergen Reactions   Nitrofurantoin Other (See Comments)    syncope   Oxycodone-Acetaminophen Other (See Comments)    Other   Risperidone And Related     Became very lethargic   Penicillins Rash    Has patient had a PCN reaction causing immediate rash, facial/tongue/throat swelling, SOB or lightheadedness with hypotension: NO Has patient had a PCN reaction causing severe rash involving mucus membranes or skin necrosis: NO Has patient had a PCN reaction that required hospitalization NO Has patient had a PCN reaction occurring within the last 10 years:NO If all of the above answers are "NO", then may proceed with Cephalosporin use.    Level of Care/Admitting Diagnosis ED Disposition     ED Disposition  Admit   Condition  --   Comment  Hospital Area: [facility] [[id number]]  Level of Care: Telemetry Medical [104]  May place patient in observation at [facility] or [facility] if equivalent level of care is available:: No  Covid Evaluation: Asymptomatic - no recent exposure (last 10 days) testing not required  Diagnosis: Acute encephalopathy [454098]  Admitting Physician: Hannah Beat [[id number]]  Attending Physician: Hannah Beat [[id number]]          B Medical/Surgery  History Past Medical History:  Diagnosis Date   Headache    Herpes genitalis in women    History of kidney stones    Hypertension    Kidney stones    Movement disorder    Multiple sclerosis (HCC)    Neuropathy    Oral herpes    Ulcerative colitis (HCC)    Vision abnormalities    Past Surgical History:  Procedure Laterality Date   ENDOMETRIAL ABLATION  10/10/2017   KIDNEY STONE SURGERY     PORTACATH PLACEMENT N/A 10/16/2017   Procedure: ULTRA SOUND GUIDED INSERTION PORT-A-CATH ERAS PATHWAY;  Surgeon: Rodman Pickle, MD;  Location: [facility];  Service: General;  Laterality: N/A;     A IV Location/Drains/Wounds Patient Lines/Drains/Airways Status     Active Line/Drains/Airways     Name Placement date Placement time Site Days   Implanted Port 10/16/17 Right Chest 10/16/17  1407  Chest  1933   Peripheral IV 01/25/23 20 G Anterior;Distal;Left;Upper Arm 01/25/23  2234  Arm  6   External Urinary Catheter 01/25/23  2349  --  6            Intake/Output Last 24 hours No intake or output data in the 24 hours ending 01/31/23 1940  Labs/Imaging Results for orders placed or performed during the hospital encounter of 01/31/23 (from the past 48 hour(s))  Basic metabolic panel     Status: Abnormal   Collection Time: 01/31/23 11:31 AM  Result Value Ref Range   Sodium 145 135 -  145 mmol/L   Potassium 4.3 3.5 - 5.1 mmol/L   Chloride 109 98 - 111 mmol/L   CO2 25 22 - 32 mmol/L   Glucose, Bld 123 (H) 70 - 99 mg/dL    Comment: Glucose reference range applies only to samples taken after fasting for at least 8 hours.   BUN 33 (H) 6 - 20 mg/dL   Creatinine, Ser 1.61 (H) 0.44 - 1.00 mg/dL   Calcium 9.4 8.9 - 09.6 mg/dL   GFR, Estimated 49 (L) >60 mL/min    Comment: (NOTE) Calculated using the CKD-EPI Creatinine Equation (2021)    Anion gap 11 5 - 15    Comment: Performed at [facility], [address], [place], [place] [address]  CBC     Status: Abnormal   Collection  Time: 01/31/23 11:31 AM  Result Value Ref Range   WBC 10.8 (H) 4.0 - 10.5 K/uL   RBC 4.21 3.87 - 5.11 MIL/uL   Hemoglobin 12.3 12.0 - 15.0 g/dL   HCT 98.1 19.1 - 47.8 %   MCV 95.5 80.0 - 100.0 fL   MCH 29.2 26.0 - 34.0 pg   MCHC 30.6 30.0 - 36.0 g/dL   RDW 29.5 (H) 62.1 - 30.8 %   Platelets 154 150 - 400 K/uL   nRBC 0.0 0.0 - 0.2 %    Comment: Performed at [facility], [address] [place], [place] [address]  CBG monitoring, ED     Status: Abnormal   Collection Time: 01/31/23 11:56 AM  Result Value Ref Range   Glucose-Capillary 123 (H) 70 - 99 mg/dL    Comment: Glucose reference range applies only to samples taken after fasting for at least 8 hours.  I-Stat beta hCG blood, ED     Status: Abnormal   Collection Time: 01/31/23  1:54 PM  Result Value Ref Range   I-stat hCG, quantitative 13.5 (H) <5 mIU/mL   Comment 3            Comment:   GEST. AGE      CONC.  (mIU/mL)   <=1 WEEK        5 - 50     2 WEEKS       50 - 500     3 WEEKS       100 - 10,000     4 WEEKS     1,000 - 30,000        FEMALE AND NON-PREGNANT FEMALE:     LESS THAN 5 mIU/mL   Differential     Status: Abnormal   Collection Time: 01/31/23  3:35 PM  Result Value Ref Range   Neutrophils Relative % 71 %   Neutro Abs 5.7 1.7 - 7.7 K/uL   Lymphocytes Relative 11 %   Lymphs Abs 0.8 0.7 - 4.0 K/uL   Monocytes Relative 15 %   Monocytes Absolute 1.2 (H) 0.1 - 1.0 K/uL   Eosinophils Relative 2 %   Eosinophils Absolute 0.1 0.0 - 0.5 K/uL   Basophils Relative 1 %   Basophils Absolute 0.1 0.0 - 0.1 K/uL   Immature Granulocytes 0 %   Abs Immature Granulocytes 0.02 0.00 - 0.07 K/uL    Comment: Performed at [facility], [address] [place], [place] [address]  Hepatic function panel     Status: Abnormal   Collection Time: 01/31/23  3:35 PM  Result Value Ref Range   Total Protein 6.3 (L) 6.5 - 8.1  g/dL   Albumin 3.2 (L) 3.5 - 5.0 g/dL   AST 64 (H) 15 - 41 U/L   ALT 83 (H) 0 - 44 U/L   Alkaline  Phosphatase 142 (H) 38 - 126 U/L   Total Bilirubin 0.6 0.3 - 1.2 mg/dL   Bilirubin, Direct 0.1 0.0 - 0.2 mg/dL   Indirect Bilirubin 0.5 0.3 - 0.9 mg/dL    Comment: Performed at [facility], [address], [place], [place] [address]  TSH     Status: None   Collection Time: 01/31/23  3:35 PM  Result Value Ref Range   TSH 3.649 0.350 - 4.500 uIU/mL    Comment: Performed by a 3rd Generation assay with a functional sensitivity of <=0.01 uIU/mL. Performed at [facility], [address] [place], [place] [address]   T4, free     Status: None   Collection Time: 01/31/23  3:35 PM  Result Value Ref Range   Free T4 0.75 0.61 - 1.12 ng/dL    Comment: (NOTE) Biotin ingestion may interfere with free T4 tests. If the results are inconsistent with the TSH level, previous test results, or the clinical presentation, then consider biotin interference. If needed, order repeat testing after stopping biotin. Performed at [facility], [address], [place], [place] [address]   CBC     Status: Abnormal   Collection Time: 01/31/23  3:35 PM  Result Value Ref Range   WBC 8.0 4.0 - 10.5 K/uL   RBC 4.24 3.87 - 5.11 MIL/uL   Hemoglobin 12.7 12.0 - 15.0 g/dL   HCT 13.0 86.5 - 78.4 %   MCV 96.2 80.0 - 100.0 fL   MCH 30.0 26.0 - 34.0 pg   MCHC 31.1 30.0 - 36.0 g/dL   RDW 69.6 (H) 29.5 - 28.4 %   Platelets 161 150 - 400 K/uL   nRBC 0.0 0.0 - 0.2 %    Comment: Performed at [facility], [address], [place], [place] [address]  Ammonia     Status: None   Collection Time: 01/31/23  4:22 PM  Result Value Ref Range   Ammonia <10 9 - 35 umol/L    Comment: Performed at [facility], [address], [place], [place] [address]  Urinalysis, Routine w reflex microscopic -Urine, Clean Catch     Status: Abnormal   Collection Time: 01/31/23  4:51 PM  Result Value Ref Range   Color, Urine YELLOW YELLOW   APPearance CLOUDY (A) CLEAR   Specific Gravity, Urine 1.010 1.005 - 1.030    pH 5.0 5.0 - 8.0   Glucose, UA NEGATIVE NEGATIVE mg/dL   Hgb urine dipstick NEGATIVE NEGATIVE   Bilirubin Urine NEGATIVE NEGATIVE   Ketones, ur NEGATIVE NEGATIVE mg/dL   Protein, ur NEGATIVE NEGATIVE mg/dL   Nitrite NEGATIVE NEGATIVE   Leukocytes,Ua SMALL (A) NEGATIVE   RBC / HPF 0-5 0 - 5 RBC/hpf   WBC, UA 11-20 0 - 5 WBC/hpf   Bacteria, UA RARE (A) NONE SEEN   Squamous Epithelial / HPF 11-20 0 - 5 /HPF   Mucus PRESENT    Hyaline Casts, UA PRESENT     Comment: Performed at [facility], [address] [place], [place] [address]  I-Stat venous blood gas, [facility], [facility], [facility])     Status: Abnormal   Collection Time: 01/31/23  4:52 PM  Result Value Ref Range   pH, Ven 7.339 7.25 - 7.43  pCO2, Ven 50.5 44 - 60 mmHg   pO2, Ven 104 (H) 32 - 45 mmHg   Bicarbonate 27.2 20.0 - 28.0 mmol/L   TCO2 29 22 - 32 mmol/L   O2 Saturation 98 %   Acid-Base Excess 1.0 0.0 - 2.0 mmol/L   Sodium 144 135 - 145 mmol/L   Potassium 3.9 3.5 - 5.1 mmol/L   Calcium, Ion 1.01 (L) 1.15 - 1.40 mmol/L   HCT 39.0 36.0 - 46.0 %   Hemoglobin 13.3 12.0 - 15.0 g/dL   Sample type VENOUS    MR Brain W and Wo Contrast  Result Date: 01/31/2023 CLINICAL DATA:  Altered mental status, nontraumatic (Ped 0-17y) hallucinations, hx of MS, ?aphasia EXAM: MRI HEAD WITHOUT AND WITH CONTRAST TECHNIQUE: Multiplanar, multiecho pulse sequences of the brain and surrounding structures were obtained without and with intravenous contrast. CONTRAST:  8mL GADAVIST GADOBUTROL 1 MMOL/ML IV SOLN COMPARISON:  MR Head 11/29/20 FINDINGS: Brain: Negative for an acute infarct. No hydrocephalus. No extra-axial fluid collection. No hemorrhage. Redemonstrated extensive demyelinating lesions in the bilateral cerebral hemispheres. No evidence of diffusion restriction or contrast enhancement to suggest demyelination. No evidence of hemorrhage, but assessment of the susceptibility weighted sequences is limited due to motion artifact. Generalized volume  loss. Vascular: Normal flow voids. Skull and upper cervical spine: Normal marrow signal. Sinuses/Orbits: No middle ear or mastoid effusion. Paranasal sinuses are clear. Orbits are unremarkable. Other: None. IMPRESSION: 1. No acute intracranial process. 2. Redemonstrated extensive demyelinating lesions in the bilateral cerebral hemispheres. No evidence of diffusion restriction or contrast enhancement to suggest active demyelination. Plaque burden is severe, but similar to prior exam from 2022. Electronically Signed   By: Lorenza Cambridge M.D.   On: 01/31/2023 19:08   DG Chest Portable 1 View  Result Date: 01/31/2023 CLINICAL DATA:  Altered mental status EXAM: PORTABLE CHEST 1 VIEW COMPARISON:  X-ray 01/25/2023 FINDINGS: Stable right IJ chest port with tip along the central SVC. Film is rotated to the left. Enlarged cardiopericardial silhouette. Calcified aorta. No pneumothorax or edema. Small left effusion. Osteopenia. Overlapping cardiac leads. IMPRESSION: Stable chest port.  Enlarged heart.  Film is rotated to the left. Question small left effusion Electronically Signed   By: Karen Kays M.D.   On: 01/31/2023 16:15    Pending Labs Unresulted Labs (From admission, onward)     Start     Ordered   02/07/23 0500  Creatinine, serum  (enoxaparin (LOVENOX)    CrCl >/= 30 ml/min)  Weekly,   R     Comments: while on enoxaparin therapy    01/31/23 1937   02/01/23 0500  Basic metabolic panel  Tomorrow morning,   R        01/31/23 1937   02/01/23 0500  CBC  Tomorrow morning,   R        01/31/23 1937   01/31/23 1933  HIV Antibody (routine testing w rflx)  (HIV Antibody (Routine testing w reflex) panel)  Once,   R        01/31/23 1937   01/31/23 1933  CBC  (enoxaparin (LOVENOX)    CrCl >/= 30 ml/min)  Once,   R       Comments: Baseline for enoxaparin therapy IF NOT ALREADY DRAWN.  Notify MD if PLT < 100 K.    01/31/23 1937   01/31/23 1933  Creatinine, serum  (enoxaparin (LOVENOX)    CrCl >/= 30 ml/min)   Once,   R  Comments: Baseline for enoxaparin therapy IF NOT ALREADY DRAWN.    01/31/23 1937   01/31/23 1913  Urine Culture  Once,   URGENT       Question:  Indication  Answer:  Altered mental status (if no other cause identified)   01/31/23 1912   01/31/23 1816  10-Hydroxycarbazepine  ONCE - URGENT,   URGENT        01/31/23 1815   01/31/23 1610  Rapid urine drug screen (hospital performed)  ONCE - STAT,   STAT        01/31/23 1609            Vitals/Pain Today's Vitals   01/31/23 1615 01/31/23 1630 01/31/23 1645 01/31/23 1930  BP: 110/76 119/81 (!) 133/112 128/61  Pulse: 89 90 89 86  Resp: 17 20 16 12   Temp:    97.8 F (36.6 C)  TempSrc:    Oral  SpO2: 100% 100% 95% 92%  Weight:      Height:      PainSc:    0-No pain    Isolation Precautions No active isolations  Medications Medications  diazepam (VALIUM) injection 2.5-5 mg (has no administration in time range)  enoxaparin (LOVENOX) injection 40 mg (has no administration in time range)  0.9 %  sodium chloride infusion (has no administration in time range)  acetaminophen (TYLENOL) tablet 650 mg (has no administration in time range)    Or  acetaminophen (TYLENOL) suppository 650 mg (has no administration in time range)  traZODone (DESYREL) tablet 25 mg (has no administration in time range)  magnesium hydroxide (MILK OF MAGNESIA) suspension 30 mL (has no administration in time range)  ondansetron (ZOFRAN) tablet 4 mg (has no administration in time range)    Or  ondansetron (ZOFRAN) injection 4 mg (has no administration in time range)  gadobutrol (GADAVIST) 1 MMOL/ML injection 8 mL (8 mLs Intravenous Contrast Given 01/31/23 1844)    Mobility power wheelchair     Focused Assessments Neuro Assessment Handoff:  Swallow screen pass? Yes          Neuro Assessment:   Neuro Checks:      Has TPA been given? No If patient is a Neuro Trauma and patient is going to OR before floor call report to 4N Charge  nurse: [phone] or [phone]   R Recommendations: See Admitting Provider Note  Report given to:   Additional Notes:

## 2023-01-31 NOTE — Assessment & Plan Note (Signed)
-   We will can continue her Valium as well as Prozac.

## 2023-01-31 NOTE — Assessment & Plan Note (Signed)
-   We will continue her antihypertensive therapy. 

## 2023-01-31 NOTE — Telephone Encounter (Signed)
I called husband. See phone note.

## 2023-02-01 DIAGNOSIS — R443 Hallucinations, unspecified: Secondary | ICD-10-CM | POA: Diagnosis not present

## 2023-02-01 DIAGNOSIS — N390 Urinary tract infection, site not specified: Secondary | ICD-10-CM | POA: Diagnosis not present

## 2023-02-01 DIAGNOSIS — R5383 Other fatigue: Secondary | ICD-10-CM | POA: Diagnosis not present

## 2023-02-01 DIAGNOSIS — G9340 Encephalopathy, unspecified: Secondary | ICD-10-CM | POA: Diagnosis not present

## 2023-02-01 DIAGNOSIS — R7401 Elevation of levels of liver transaminase levels: Secondary | ICD-10-CM | POA: Diagnosis present

## 2023-02-01 LAB — CBC
HCT: 37.9 % (ref 36.0–46.0)
Hemoglobin: 11.6 g/dL — ABNORMAL LOW (ref 12.0–15.0)
MCH: 29.1 pg (ref 26.0–34.0)
MCHC: 30.6 g/dL (ref 30.0–36.0)
MCV: 95.2 fL (ref 80.0–100.0)
Platelets: 147 10*3/uL — ABNORMAL LOW (ref 150–400)
RBC: 3.98 MIL/uL (ref 3.87–5.11)
RDW: 16.7 % — ABNORMAL HIGH (ref 11.5–15.5)
WBC: 7.2 10*3/uL (ref 4.0–10.5)
nRBC: 0 % (ref 0.0–0.2)

## 2023-02-01 LAB — COMPREHENSIVE METABOLIC PANEL
ALT: 83 U/L — ABNORMAL HIGH (ref 0–44)
AST: 65 U/L — ABNORMAL HIGH (ref 15–41)
Albumin: 2.9 g/dL — ABNORMAL LOW (ref 3.5–5.0)
Alkaline Phosphatase: 126 U/L (ref 38–126)
Anion gap: 11 (ref 5–15)
BUN: 32 mg/dL — ABNORMAL HIGH (ref 6–20)
CO2: 23 mmol/L (ref 22–32)
Calcium: 8.8 mg/dL — ABNORMAL LOW (ref 8.9–10.3)
Chloride: 109 mmol/L (ref 98–111)
Creatinine, Ser: 1.19 mg/dL — ABNORMAL HIGH (ref 0.44–1.00)
GFR, Estimated: 53 mL/min — ABNORMAL LOW (ref >60)
Glucose, Bld: 120 mg/dL — ABNORMAL HIGH (ref 70–99)
Potassium: 3.9 mmol/L (ref 3.5–5.1)
Sodium: 143 mmol/L (ref 135–145)
Total Bilirubin: 0.3 mg/dL (ref 0.3–1.2)
Total Protein: 5.8 g/dL — ABNORMAL LOW (ref 6.5–8.1)

## 2023-02-01 LAB — CK: Total CK: 13 U/L — ABNORMAL LOW (ref 38–234)

## 2023-02-01 LAB — LACTATE DEHYDROGENASE: LDH: 141 U/L (ref 98–192)

## 2023-02-01 LAB — GAMMA GT: GGT: 119 U/L — ABNORMAL HIGH (ref 7–50)

## 2023-02-01 MED ORDER — TOLTERODINE TARTRATE 2 MG PO TABS
2.0000 mg | ORAL_TABLET | Freq: Two times a day (BID) | ORAL | Status: DC
  Administered 2023-02-01 – 2023-02-03 (×5): 2 mg via ORAL
  Filled 2023-02-01 (×7): qty 1

## 2023-02-01 MED ORDER — CHLORHEXIDINE GLUCONATE CLOTH 2 % EX PADS
6.0000 | MEDICATED_PAD | Freq: Every day | CUTANEOUS | Status: DC
  Administered 2023-02-01 – 2023-02-02 (×2): 6 via TOPICAL

## 2023-02-01 MED ORDER — MESALAMINE ER 0.375 G PO CP24
1.1250 g | ORAL_CAPSULE | Freq: Once | ORAL | Status: AC
  Administered 2023-02-01: 1.125 g via ORAL
  Filled 2023-02-01: qty 3

## 2023-02-01 MED ORDER — TERIFLUNOMIDE 14 MG PO TABS
14.0000 mg | ORAL_TABLET | Freq: Every day | ORAL | Status: DC
  Administered 2023-02-01 – 2023-02-02 (×2): 14 mg via ORAL
  Filled 2023-02-01: qty 1

## 2023-02-01 NOTE — Progress Notes (Signed)
Orthopedic Tech Progress Note Patient Details:  Vanessa Short 1965-11-09 [id number]  Routine order for a R resting hand splint called into [facility] at 1956.  Patient ID: Vanessa Short, female   DOB: [birth year], 57 y.o.   MRN: [id number]  Docia Furl 02/01/2023, 8:28 PM

## 2023-02-01 NOTE — Progress Notes (Signed)
Neurology Progress Note  Brief HPI: 57 y.o. female with a past medical history of multiple sclerosis, elevated GC levels, spastic quadriparesis, depression, ulcerative colitis, heart failure as well as history of delirium associated with urinary tract infections who presents with confusion. She was seen on 6/14 in the ED for episodes of behavioral arrest and hallucinations and she was seen by neurology then. Her husband states that her staring spells have worsened but she does not seem to be having hallucinations. She was prescribed Flagyl, doxycycline, and cefdinir for an abscess in her nares that was MRSA positive.  These were then de-escalated to just doxycycline and mupirocin cream.  She is currently on acyclovir, baclofen, dantrolene, diazepam, Trileptal, teriflunomide for her MS. Additionally she is on 20mg  of baclofen QID, seroquel 25mg  daily, prozac 50mg  daily, oxcarbazepine 150mg  TID, valium 5mg  daily. During last admission EEG was negative for seizure activity. Video of spells appear to show her falling asleep during conversation with roving eye movements, which is similar to our examination. No twitching noted in video or during exam. She arouses for brief periods prior to falling back to sleep. BP has been stable during spells and she is protecting her airway.   Subjective: Patient seen in room. She is currently receiving rocephin for possible UTI.  She is much more awake today. Husband states he hasn't noticed as many staring spells today.  Liver function is slightly elevated, ammonia normal  Exam: Vitals:   02/01/23 0416 02/01/23 0417  BP:    Pulse:    Resp: 16 (!) 9  Temp:    SpO2:     Gen: In bed, NAD Resp: non-labored breathing, no acute distress Abd: soft, nt  NEURO:  Mental Status: Awake and alert oriented x4, does not remember her time in the ED yesterday Language: speech is clear.  Intact naming, repetition, fluency, and comprehension.  On later attending exam a little  sleepier but still improved from yesterday Cranial Nerves:  II: PERRL. Visual fields full to confrontation.  III, IV, VI: EOM intact. Eyelids elevate symmetrically. Blinks to threat.  V: facial sensation intact VII: no facial asymmetry   VIII: hearing intact to voice IX, X: Palate elevates symmetrically.  BM:[id number] shrug symmetrical  XII: tongue is midline without fasciculations. Motor:  RUE:  2-3/5  able to straighten fingers LUE: 1/5 RLE: 0/5        LLE: 0/5  Sensation- Intact to light touch bilaterally Coordination and gait: Unable to perform  Pertinent Labs:  Latest Reference Range & Units 03/22/22 14:35 10/18/22 13:24 11/29/22 13:48 01/10/23 12:11 01/22/23 16:12 01/25/23 22:31 01/26/23 04:57 01/31/23 15:35 02/01/23 07:49  Alkaline Phosphatase 38 - 126 U/L 98 130 (H) 133 (H) 135 (H) 110 107 114 142 (H) 126  Albumin 3.5 - 5.0 g/dL 4.3 4.1 4.0 4.0 3.1 (L) 3.3 (L) 2.9 (L) 3.2 (L) 2.9 (L)  AST 15 - 41 U/L 16 23 26 22 25 29  42 (H) 64 (H) 65 (H)  ALT 0 - 44 U/L 22 37 (H) 34 (H) 33 (H) 35 40 39 83 (H) 83 (H)  (H): Data is abnormally high (L): Data is abnormally low  Alk Phos 142 Albumin 3.2 AST/ALT 64/83 Oxcarb level- pending  Ammonia- <10 LDH CK GGT Aldolase  Imaging Reviewed: MRI Brain - No acute intracranial process. Redemonstrated extensive demyelinating lesions in the bilateral cerebral hemispheres. No evidence of diffusion restriction or contrast enhancement to suggest active demyelination. Plaque burden is severe, but similar to prior exam from 2022.  Assessment:  57 y.o. female with a pertinent medical history of  presenting with past medical history of multiple sclerosis as well as history of delirium associated with urinary tract infections who presents with confusion. She was seen on 6/14 in the ED for episodes of behavioral arrest and hallucinations and she was seen by neurology then. Her husband states that her staring spells have worsened but she does not seem to  be having hallucinations. WBC is currently 10.8. UA is pending. She is currently receiving 20mg  of baclofen QID, seroquel 25mg  daily, oxcarbazepine 150mg  TID, prozac 50mg  daily, valium 5mg  daily. As she has aged and her kidney function has decreased she may not be metabolizing her medications as well which may contribute to her sedation and delirium. We may consider decreasing some of these medications to see if it improves her mental status. The video of her spells is concerning for delirium and less suspicious for seizure activity as she is able to be aroused throughout the entire episode. No twitching or abnormal movements noted.  Query whether in the setting of age, advancing multiple sclerosis and worsening renal function she may need less sedating medications and her improvement in the hospital may be secondary to many of these medications being held or reduced while inpatient  Exam remains c/w hypoactive delirium with waxing and waning, poor attention/concentration and some hallucinations.   On review of outpatient notes, she was reporting worsening hallucinations since March, which correlates with worsening Alk Phos and ALT. These subsequently normalized in June but are now again elevated. Will assess further to confirm this is due to hepatocellular injury rather than muscle injury. Aubagio is associated with increased serum alanine aminotransferase (13% to 15% per [employer]); if no other etiology is found would discuss with outpatient neurologist.   Certainly patient does have features of sleep apnea on exam (witnessed apneas when sleepy and snoring respirations) -- however, given her limited mobility CPAP would be high risk. Outpatient referral for evaluation of potential utility of INSPIRE or other options may be helpful overall  Impression: Toxic metabolic encephalopathy causing delirium secondary to elevated transaminases and polypharmacy and potential component of sleep apnea Secondary  progressive MS still felt to be active with intermittent relpases, without evidence of active relapse at this time   Recommendations: - Urinalysis is pending- UTI treatment per primary team, noting that she does have a suprapubic catheter - Delirium precautions  - Continue baclofen at reduced dose of 15 mg QID  - CK, GGT, Aldolase, LDH to distinguish hepatocellular injury vs muscle injury  - Consider outpatient referral for possible consideration of Inspire (patient/husband request) - If these studies suggest myopathy, will investigate further, but if more consistent with primary liver injury, neurology will defer to primary team and be available as needed.  - Aubagio is associated with increased serum alanine aminotransferase (13% to 15%), will reach out to outpatient neurologist about the potential of this contributing - Recommendations conveyed to primary team via secure chat and discussed with patient and husband at bedside   Patient seen and examined by NP/APP with MD. MD to update note as needed.   Elmer Picker, DNP, FNP-BC [facility] Pager: [phone]  Attending Neurologist's note:  I personally saw this patient, gathering history, performing a full neurologic examination, reviewing relevant labs, personally reviewing relevant imaging including MRI brain, and formulated the assessment and plan, adding the note above for completeness and clarity to accurately reflect my thoughts  Brooke Dare MD-PhD [facility] [phone] Available  7 AM to 7 PM, outside these hours please contact Neurologist on call listed on AMION

## 2023-02-01 NOTE — Care Management Obs Status (Signed)
MEDICARE OBSERVATION STATUS NOTIFICATION   Patient Details  Name: SARISSA AZLIN MRN: [id number] Date of Birth: Oct 31, 1965   Medicare Observation Status Notification Given:  Yes    Kingsley Plan, RN 02/01/2023, 12:46 PM

## 2023-02-01 NOTE — Evaluation (Signed)
Physical Therapy Evaluation Patient Details Name: Vanessa Short MRN: [id number] DOB: 01/21/1966 Today's Date: 02/01/2023  History of Present Illness  Pt is a 57 y.o. female who presented 01/31/23 with AMS, increased lethargy, and increased visual and auditory hallucinations. MRI was negative for acute intracranial process but did redemonstrated extensive demyelinating lesions in the bilateral cerebral hemispheres with no evidence of diffusion restriction or contrast enhancement to suggest active demyelination, plaque burden is severe but similar to prior exam from 2022. Pt recently admitted 6/13 - 6/16 with hallucinations and was found to be positive for MRSA and EEG suggesting moderate diffuse encephalopathy, nonspecific etiology. PMHx: multiple sclerosis (32 years ago), major depressive disorder, R tib/fib fracture, L humeral head fracture, anxiety, hallucinations, herpes, HTN, and paraplegia.   Clinical Impression  Pt presents with condition above and deficits mentioned below, see PT Problem List. PTA, she was dependent on her husband and home health aides for functional mobility and ADLs as she can only actively move her R UE out of all 4 of her extremities. She displays deficits in tone, ROM, strength, balance, and activity tolerance. Pt and her husband are desiring HHPT and HHOT to assist in ROM of her lower extremities and begin to initiate pt getting into a routine with OOB mobility along with assisting her in adapting her utensils and pens to be easier to grip and use with her R UE. Will continue to follow acutely to begin to provide this education and assist her in tolerating OOB mobility again.       Recommendations for follow up therapy are one component of a multi-disciplinary discharge planning process, led by the attending physician.  Recommendations may be updated based on patient status, additional functional criteria and insurance authorization.  Follow Up Recommendations        Assistance Recommended at Discharge Frequent or constant Supervision/Assistance  Patient can return home with the following  Two people to help with walking and/or transfers;Two people to help with bathing/dressing/bathroom;Assistance with feeding;Direct supervision/assist for medications management;Direct supervision/assist for financial management;Assist for transportation;Help with stairs or ramp for entrance;Assistance with cooking/housework    Equipment Recommendations Other (comment) (would benefit from electric hoyer lift and sit > stand lift if able as current ones are manual)  Recommendations for Other Services       Functional Status Assessment Patient has had a recent decline in their functional status and/or demonstrates limited ability to make significant improvements in function in a reasonable and predictable amount of time     Precautions / Restrictions Precautions Precautions: Fall Precaution Comments: no active movement L UE and bil LE; recent R LE and L UE fxs Restrictions Weight Bearing Restrictions: Yes LUE Weight Bearing: Weight bearing as tolerated RLE Weight Bearing: Partial weight bearing RLE Partial Weight Bearing Percentage or Pounds: 50% Other Position/Activity Restrictions: Per husband, ortho recommending 50% WB to RLE, however pt is unable to tolerate so maintaining NWB at this time; no restriction on LUE.      Mobility  Bed Mobility Overal bed mobility: Needs Assistance Bed Mobility: Rolling Rolling: Total assist         General bed mobility comments: Total assist to bring leg over contralateral leg and rotate hips and trunk to roll either direction in bed for pericare and pressure relief positioning.    Transfers                   General transfer comment: pt declined attempts this date    Ambulation/Gait  General Gait Details: not ambulatory at baseline  Stairs            Wheelchair Mobility     Modified Rankin (Stroke Patients Only)       Balance                                             Pertinent Vitals/Pain Pain Assessment Pain Assessment: Faces Faces Pain Scale: Hurts a little bit Pain Location: L knee with rolling to L Pain Descriptors / Indicators: Discomfort Pain Intervention(s): Limited activity within patient's tolerance, Monitored during session, Repositioned    Home Living Family/patient expects to be discharged to:: Private residence Living Arrangements: Spouse/significant other Available Help at Discharge: Family;Personal care attendant;Available 24 hours/day Type of Home: House Home Access: Ramped entrance     Alternate Level Stairs-Number of Steps: pt doesn't access Home Layout: Two level;Able to live on main level with bedroom/bathroom Home Equipment: Other (comment);Wheelchair - power;BSC/3in1 (hoyer lift; sit > stand lift; adjustable bed) Additional Comments: aide 7 days/week 7am-11pm    Prior Function Prior Level of Function : Needs assist             Mobility Comments: recently using hoyer lift to w/c, BSC and shower chair; was using sit to stand lift for transfers prior to fall and R LE fx in December, sit > stand lift does not require LE or UE strength as the sling goes under her armpits and it pulls her dependently up to stand ADLs Comments: Needs assist for all ADL/IADLs; increasing difficulty with grasping utensils and pens to feed self and write with R UE     Hand Dominance   Dominant Hand: Right    Extremity/Trunk Assessment   Upper Extremity Assessment Upper Extremity Assessment: Defer to OT evaluation;RUE deficits/detail;LUE deficits/detail RUE Deficits / Details: grossly 3+ to 4-/5 strength, shoulder flexion AROM limited to approx 80 degrees; denies numbness/tingling LUE Deficits / Details: able to shrug shoulders actively but otherwise MMT of 0 throughout; spastic, holding in extension synergy position,  but able to achieve full elbow flexion PROM with repetition and extra time; denies numbness/tingling    Lower Extremity Assessment Lower Extremity Assessment: RLE deficits/detail;LLE deficits/detail RLE Deficits / Details: recent R tib/fib fx; MMT 0 throughout LE with attempts at active, purposeful movement; spastic movement resulting in ankle DF/PF and knee flexion at times; PROM limited to ~45 degrees hip flexion ~60 degrees knee flexion; denies numbness/tingling; holds R leg in external rotation and knee flexion at rest, reportedly partly due to her scoliosis LLE Deficits / Details: MMT 0 throughout LE with attempts at active, purposeful movement; spastic movement resulting in ankle DF/PF and knee flexion at times; denies numbness/tingling    Cervical / Trunk Assessment Cervical / Trunk Assessment: Kyphotic;Other exceptions (scoliosis)  Communication   Communication: No difficulties  Cognition Arousal/Alertness: Awake/alert Behavior During Therapy: WFL for tasks assessed/performed                                   General Comments: Pt declines any more hallucinations. Appears close to if not at her baseline again. Husband reporting she is more herself today        General Comments General comments (skin integrity, edema, etc.): educated pt and husband on PROM exercises for LEs, but could benefit from a  handout    Exercises General Exercises - Upper Extremity Elbow Flexion: PROM, Left, 5 reps, Supine Elbow Extension: PROM, Left, 5 reps, Supine Wrist Flexion: PROM, Left, 5 reps, Supine Wrist Extension: PROM, Left, 5 reps, Supine Digit Composite Flexion: PROM, Left, 5 reps, Supine Composite Extension: PROM, Left, 5 reps, Supine General Exercises - Lower Extremity Ankle Circles/Pumps: PROM, Both, 5 reps, Supine Quad Sets: PROM, Both, 5 reps, Supine Heel Slides: PROM, Both, 5 reps, Supine Hip ABduction/ADduction: PROM, Both, 5 reps, Supine   Assessment/Plan    PT  Assessment Patient needs continued PT services  PT Problem List Decreased strength;Decreased range of motion;Decreased activity tolerance;Decreased balance;Decreased mobility;Decreased cognition;Impaired tone       PT Treatment Interventions DME instruction;Functional mobility training;Therapeutic activities;Therapeutic exercise;Balance training;Neuromuscular re-education;Cognitive remediation;Patient/family education;Wheelchair mobility training    PT Goals (Current goals can be found in the Care Plan section)  Acute Rehab PT Goals Patient Stated Goal: go home and get used to being in her wheelchair and progressing to using sit>stand lift PT Goal Formulation: With patient/family Time For Goal Achievement: 02/14/23 Potential to Achieve Goals: Fair    Frequency Min 2X/week     Co-evaluation               AM-PAC PT "6 Clicks" Mobility  Outcome Measure Help needed turning from your back to your side while in a flat bed without using bedrails?: Total Help needed moving from lying on your back to sitting on the side of a flat bed without using bedrails?: Total Help needed moving to and from a bed to a chair (including a wheelchair)?: Total Help needed standing up from a chair using your arms (e.g., wheelchair or bedside chair)?: Total Help needed to walk in hospital room?: Total Help needed climbing 3-5 steps with a railing? : Total 6 Click Score: 6    End of Session   Activity Tolerance: Patient tolerated treatment well Patient left: in bed;with call bell/phone within reach;with bed alarm set;with nursing/sitter in room;with family/visitor present   PT Visit Diagnosis: Unsteadiness on feet (R26.81);History of falling (Z91.81);Muscle weakness (generalized) (M62.81);Other symptoms and signs involving the nervous system (R29.898)    Time: 1610-9604 PT Time Calculation (min) (ACUTE ONLY): 50 min   Charges:   PT Evaluation $PT Eval Moderate Complexity: 1 Mod PT  Treatments $Therapeutic Exercise: 8-22 mins $Therapeutic Activity: 8-22 mins        Raymond Gurney, PT, DPT Acute Rehabilitation Services  Office: [phone]   Jewel Baize 02/01/2023, 2:07 PM

## 2023-02-01 NOTE — Progress Notes (Signed)
Pt and family are insisting that the pt wear a purewick.

## 2023-02-01 NOTE — Progress Notes (Signed)
[facility]                                                                              Vanessa Short, is a 57 y.o. female, DOB - 06-27-1966, ZOX:[id number] Admit date - 01/31/2023    Outpatient Primary MD for the patient is Richmond Campbell., [name]  LOS - 0  days  Chief Complaint  Patient presents with   Fatigue   Hallucinations       Brief summary   Patient is a 57 year old female with multiple sclerosis, HTN, peripheral neuropathy, ulcerative colitis, urolithiasis presented with acute onset of altered mental status, excessive sleepiness, increased visual and auditory hallucinations at home and significant lethargy on the morning of admission. Per patient's husband, she slept all day a day before the admission and at 1 AM she was having visual hallucination and seeing people in the house.  No new paresthesias or focal muscle weakness, headache or dizziness or blurred vision.  No cough or wheezing or dyspnea.  She has a suprapubic catheter, with no significant urinary symptoms. Patient was recently admitted to 01/25/2023-01/28/2023 and was treated for delirium, toxic encephalopathy, seizure-like activity with nonacute EEG had MRSA right nasal nasal infection, recommended mupirocin and oral doxycycline for 7 to 10 days and outpatient follow-up with ENT.  UA suspicious for UTI.  MRI brain showed no acute intracranial process.  Redemonstrated extensive demyelination lesions in the bilateral cerebral hemispheres, no active demyelination  Patient was admitted for further workup.  Neurology was consulted  Assessment & Plan    Principal Problem:   Acute encephalopathy -Presented with delirium, visual and auditory hallucinations, likely due to UTI and partly due to polypharmacy -Follow urine culture and sensitivities, continue IV Rocephin -Appreciate neurology recommendations, baclofen decreased to 15 mg 4 times daily -Mental status is already improving, continue  Seroquel   Active Problems:   Acute lower UTI -Follow urine culture and sensitivities, continue IV Rocephin    Obstructive sleep apnea -Possibly contributing to her symptoms -Will need outpatient sleep study, we can try inpatient CPAP if it helps  Anxiety, depression -Continue Valium, Prozac    Multiple sclerosis (HCC) -Continue baclofen at lower dose, continue dantrolene Valium, Trileptal and teriflunomide -At baseline, wheelchair-bound, bilateral lower extremity weakness with stiffness, RUE strength greater than LUE - has home health aide.      Ulcerative colitis (HCC) -Continue mesalamine    Hypertension -BP stable    Dyslipidemia -Hold statin due to transaminitis    Transaminitis -Hold statin, follow CCK, GGT, aldolase, LDH -Per neurology Dr Iver Nestle, Ashok Cordia is associated with transaminitis, she will discuss with outpatient neurologist   Obesity Estimated body mass index is 31.89 kg/m as calculated from the following:   Height as of this encounter: 5\' 3"  (1.6 m).   Weight as of this encounter: 81.6 kg.  Code Status: Full code DVT Prophylaxis:  enoxaparin (LOVENOX) injection 40 mg Start: 01/31/23 2000   Level of Care: Level of care: Telemetry Medical Family Communication: Updated patient's husband at the bedside Disposition Plan:      Remains inpatient appropriate: Workup in progress   Procedures:    Consultants:  Neurology  Antimicrobials:   Anti-infectives (From admission, onward)    Start     Dose/Rate Route Frequency Ordered Stop   01/31/23 2045  cefTRIAXone (ROCEPHIN) 1 g in sodium chloride 0.9 % 100 mL IVPB        1 g 200 mL/hr over 30 Minutes Intravenous Every 24 hours 01/31/23 2035            Medications  aspirin EC  81 mg Oral Daily   B-complex with vitamin C  1 tablet Oral Daily   baclofen  15 mg Oral QID   Chlorhexidine Gluconate Cloth  6 each Topical Daily   cholecalciferol  1,000 Units Oral Daily   dantrolene  50 mg Oral TID    enoxaparin (LOVENOX) injection  40 mg Subcutaneous Q24H   famotidine  40 mg Oral BID   FLUoxetine  20 mg Oral TID   folic acid  1 mg Oral Daily   furosemide  40 mg Oral BID   labetalol  50 mg Oral Daily   mesalamine  0.375 g Oral QID   multivitamin with minerals  1 tablet Oral Daily   OXcarbazepine  150 mg Oral TID   potassium chloride  20 mEq Oral Daily   QUEtiapine  25 mg Oral QHS   rosuvastatin  40 mg Oral Daily   tolterodine  2 mg Oral BID      Subjective:   Vanessa Short was seen and examined today.  Mental status a lot better this morning, husband and HHA at the bedside.  Patient is fully alert and oriented, no complaints of visual or auditory hallucinations.  No dizziness, chest pain, shortness of breath or any fevers.   Objective:   Vitals:   02/01/23 0415 02/01/23 0416 02/01/23 0417 02/01/23 0814  BP:    122/68  Pulse:    84  Resp: 15 16 (!) 9 13  Temp:    97.8 F (36.6 C)  TempSrc:    Oral  SpO2:    97%  Weight:      Height:        Intake/Output Summary (Last 24 hours) at 02/01/2023 1133 Last data filed at 02/01/2023 1000 Gross per 24 hour  Intake 527.6 ml  Output 300 ml  Net 227.6 ml     Wt Readings from Last 3 Encounters:  01/31/23 81.6 kg  01/25/23 81.6 kg  01/22/23 81.6 kg     Exam General: Alert and oriented x 3, NAD Cardiovascular: S1 S2 auscultated,  RRR Respiratory: Clear to auscultation bilaterally, no wheezing Gastrointestinal: Soft, nontender, nondistended, + bowel sounds Ext: no pedal edema bilaterally Neuro: L LE, R LE, 0/5 strength, sensations intact.  LUE 1/5, RUE 3/5 Psych: Normal affect     Data Reviewed:  I have personally reviewed following labs    CBC Lab Results  Component Value Date   WBC 7.2 02/01/2023   RBC 3.98 02/01/2023   HGB 11.6 (L) 02/01/2023   HCT 37.9 02/01/2023   MCV 95.2 02/01/2023   MCH 29.1 02/01/2023   PLT 147 (L) 02/01/2023   MCHC 30.6 02/01/2023   RDW 16.7 (H) 02/01/2023   LYMPHSABS 0.8  01/31/2023   MONOABS 1.2 (H) 01/31/2023   EOSABS 0.1 01/31/2023   BASOSABS 0.1 01/31/2023     Last metabolic panel Lab Results  Component Value Date   NA 143 02/01/2023   K 3.9 02/01/2023   CL 109 02/01/2023   CO2 23 02/01/2023   BUN 32 (H) 02/01/2023  CREATININE 1.19 (H) 02/01/2023   GLUCOSE 120 (H) 02/01/2023   GFRNONAA 53 (L) 02/01/2023   GFRAA 79 03/16/2020   CALCIUM 8.8 (L) 02/01/2023   PHOS 2.4 (L) 08/06/2016   PROT 5.8 (L) 02/01/2023   ALBUMIN 2.9 (L) 02/01/2023   LABGLOB 2.3 01/10/2023   AGRATIO 1.7 01/10/2023   BILITOT 0.3 02/01/2023   ALKPHOS 126 02/01/2023   AST 65 (H) 02/01/2023   ALT 83 (H) 02/01/2023   ANIONGAP 11 02/01/2023    CBG (last 3)  Recent Labs    01/31/23 1156  GLUCAP 123*      Coagulation Profile: No results for input(s): "INR", "PROTIME" in the last 168 hours.   Radiology Studies: I have personally reviewed the imaging studies  MR Brain W and Wo Contrast  Result Date: 01/31/2023 CLINICAL DATA:  Altered mental status, nontraumatic (Ped 0-17y) hallucinations, hx of MS, ?aphasia EXAM: MRI HEAD WITHOUT AND WITH CONTRAST TECHNIQUE: Multiplanar, multiecho pulse sequences of the brain and surrounding structures were obtained without and with intravenous contrast. CONTRAST:  8mL GADAVIST GADOBUTROL 1 MMOL/ML IV SOLN COMPARISON:  MR Head 11/29/20 FINDINGS: Brain: Negative for an acute infarct. No hydrocephalus. No extra-axial fluid collection. No hemorrhage. Redemonstrated extensive demyelinating lesions in the bilateral cerebral hemispheres. No evidence of diffusion restriction or contrast enhancement to suggest demyelination. No evidence of hemorrhage, but assessment of the susceptibility weighted sequences is limited due to motion artifact. Generalized volume loss. Vascular: Normal flow voids. Skull and upper cervical spine: Normal marrow signal. Sinuses/Orbits: No middle ear or mastoid effusion. Paranasal sinuses are clear. Orbits are unremarkable.  Other: None. IMPRESSION: 1. No acute intracranial process. 2. Redemonstrated extensive demyelinating lesions in the bilateral cerebral hemispheres. No evidence of diffusion restriction or contrast enhancement to suggest active demyelination. Plaque burden is severe, but similar to prior exam from 2022. Electronically Signed   By: Lorenza Cambridge M.D.   On: 01/31/2023 19:08   DG Chest Portable 1 View  Result Date: 01/31/2023 CLINICAL DATA:  Altered mental status EXAM: PORTABLE CHEST 1 VIEW COMPARISON:  X-ray 01/25/2023 FINDINGS: Stable right IJ chest port with tip along the central SVC. Film is rotated to the left. Enlarged cardiopericardial silhouette. Calcified aorta. No pneumothorax or edema. Small left effusion. Osteopenia. Overlapping cardiac leads. IMPRESSION: Stable chest port.  Enlarged heart.  Film is rotated to the left. Question small left effusion Electronically Signed   By: Karen Kays M.D.   On: 01/31/2023 16:15       Aynsley Fleet M.D. [facility] 02/01/2023, 11:33 AM  Available via Epic secure chat 7am-7pm After 7 pm, please refer to night coverage provider listed on amion.

## 2023-02-01 NOTE — TOC Initial Note (Addendum)
Transition of Care (TOC) - Initial/Assessment Note   Spoke to patient and husband at bedside.   PT to to see. Await recommendations.   Patient has 2 electric wheel chairs, 2 sit to stands, a hoyer, shower chair, and flat shower entrance.   Patient has home health aide 8:30 am to 11:30 daily   Patient has had home health services in past with [facility] .   PT recommending HHPT. OT working with patient. Spoke to pqatient and husband.   Kandee Keen with [facility] accepted home health referral for HHPT/OT   PT also recommending electric hoyer lift and electric sit to stand. They have a manuel hoyer lift, they did get through insurance, however patient had different insurance at the time. Husband private paid for her manuel sit to stand.    They would like both, husband asking if DME company could email NCM a picture , make/model of sit to stand . They have had an electric sit to stand in the past .   Called Mitch with [employer] , he will check to see if they carry electric hoyer lift and electric sit to stand.   Called Jermain with [employer] they do not carry electric hoyer lift or electric sit to stand   [employer] they do not carry electric hoyer lifts or electric sit to stands   [employer] spoke to [place] they do not carry sit to stands , and they only have [facility] [employer] [phone] they carry both pieces of equipment , however they do not file insurance, patient would need to private pay, estimated cost on electric hoyer lift $3700.00 , office unsure of cost of electric sit to stand tech not in office at present   Left [place] with [employer] a voicemail   Left Marylene Land with [employer], spoke to [employer] . Per Chole no insurance covers fully electric hoyer lift or fully electric sit to stands. Some insurances do cover manuel  hoyer lifts and  sit to stands . Per chole manuel is semi electric . She will email NCM information and costs on  fully  electric sit to stands and hoyer lifts . Once NCM receives email will provide to patient and husband   Patient Details  Name: Vanessa Short MRN: [id number] Date of Birth: 10/06/1965  Transition of [facility] CM/SW Contact:    Kingsley Plan, RN Phone Number: 02/01/2023, 1:02 PM  Clinical Narrative:                   Expected Discharge Plan: Home w Home Health Services     Patient Goals and CMS Choice Patient states their goals for this hospitalization and ongoing recovery are:: to return to home     [place] ownership interest in [facility].provided to:: Patient    Expected Discharge Plan and Services   Discharge Planning Services: CM Consult   Living arrangements for the past 2 months: Single Family Home                                      Prior Living Arrangements/Services Living arrangements for the past 2 months: Single Family Home Lives with:: Spouse Patient language and need for interpreter reviewed:: Yes Do you feel safe going back to the place where you live?: Yes      Need  for Family Participation in Patient Care: Yes (Comment) Care giver support system in place?: Yes (comment) Current home services: DME Criminal Activity/Legal Involvement Pertinent to Current Situation/Hospitalization: No - Comment as needed  Activities of Daily Living Home Assistive Devices/Equipment: Wheelchair, Nurse, adult ADL Screening (condition at time of admission) Patient's cognitive ability adequate to safely complete daily activities?: Yes Is the patient deaf or have difficulty hearing?: No Does the patient have difficulty seeing, even when wearing glasses/contacts?: No Does the patient have difficulty concentrating, remembering, or making decisions?: No Patient able to express need for assistance with ADLs?: Yes Does the patient have difficulty dressing or bathing?: Yes Independently performs ADLs?: No Communication: Independent Dressing (OT):  Dependent Is this a change from baseline?: Pre-admission baseline Grooming: Dependent Is this a change from baseline?: Pre-admission baseline Feeding: Independent Is this a change from baseline?: Pre-admission baseline Bathing: Dependent Is this a change from baseline?: Pre-admission baseline Toileting: Dependent Is this a change from baseline?: Pre-admission baseline In/Out Bed: Dependent Is this a change from baseline?: Pre-admission baseline Walks in Home: Dependent Is this a change from baseline?: Pre-admission baseline Does the patient have difficulty walking or climbing stairs?: Yes Weakness of Legs: Both Weakness of Arms/Hands: Both  Permission Sought/Granted   Permission granted to share information with : Yes, Verbal Permission Granted  Share Information with NAME: husband Jarlene Acierno           Emotional Assessment Appearance:: Appears stated age Attitude/Demeanor/Rapport: Engaged Affect (typically observed): Accepting Orientation: : Oriented to Self, Oriented to Place, Oriented to  Time, Oriented to Situation Alcohol / Substance Use: Not Applicable Psych Involvement: No (comment)  Admission diagnosis:  Hallucinations [R44.3] Encephalopathy [G93.40] Acute encephalopathy [G93.40] Other fatigue [R53.83] Patient Active Problem List   Diagnosis Date Noted   Transaminitis 02/01/2023   Acute encephalopathy 01/31/2023   Acute lower UTI 01/31/2023   Anxiety and depression 01/31/2023   Dyslipidemia 01/31/2023   Obstructive sleep apnea 01/31/2023   Delirium due to another medical condition, acute, hyperactive 01/26/2023   MRSA (methicillin resistant Staphylococcus aureus) infection 01/26/2023   Urinary incontinence 05/09/2021   Bedbound 10/27/2020   Lymphopenia 10/27/2020   Port-A-Cath in place 12/22/2019   Left arm weakness 12/16/2018   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    Class: History of   Auditory hallucinations  07/30/2018   Tactile hallucinations 07/30/2018   Hallucinations 07/30/2018   Altered mental status 07/29/2018   Wheelchair bound 02/27/2017   Chronic diastolic CHF (congestive heart failure) (HCC) 09/30/2016   Prolonged QT interval    High risk medication use 09/27/2016   Normocytic anemia 08/05/2016   Slurred speech 08/05/2016   Ulcerative colitis (HCC) 08/05/2016   Hypertension 08/05/2016   Subacromial bursitis 05/09/2016   Hallucination, visual 03/14/2016    Class: History of   Urinary disorder 11/09/2015   Hand weakness 01/06/2015   Multiple sclerosis (HCC) 09/07/2014   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   Dysesthesia 09/07/2014   History of Major depressive disorder, recurrent (HCC) 09/07/2014    Class: History of   PCP:  Richmond Campbell., [name] Pharmacy:   [facility] [phone] - [place], [place] - [address] AT [facility] [address] [place] [address] Phone: [phone] Fax: [phone]     Social Determinants of Health (SDOH) Social History: SDOH Screenings   Food Insecurity: No Food Insecurity (01/26/2023)  Housing: Patient Declined (01/26/2023)  Transportation Needs: No  Transportation Needs (01/26/2023)  Utilities: Not At Risk (01/26/2023)  Tobacco Use: Low Risk  (01/31/2023)   SDOH Interventions:     Readmission Risk Interventions     No data to display

## 2023-02-01 NOTE — Evaluation (Signed)
Occupational Therapy Evaluation Patient Details Name: Vanessa Short MRN: [id number] DOB: Mar 19, 1966 Today's Date: 02/01/2023   History of Present Illness Pt is a 57 y.o. female who presented 01/31/23 with AMS, increased lethargy, and increased visual and auditory hallucinations. MRI was negative for acute intracranial process but did redemonstrated extensive demyelinating lesions in the bilateral cerebral hemispheres with no evidence of diffusion restriction or contrast enhancement to suggest active demyelination, plaque burden is severe but similar to prior exam from 2022. Pt recently admitted 6/13 - 6/16 with hallucinations and was found to be positive for MRSA and EEG suggesting moderate diffuse encephalopathy, nonspecific etiology. PMHx: multiple sclerosis (32 years ago), major depressive disorder, R tib/fib fracture, L humeral head fracture, anxiety, hallucinations, herpes, HTN, and paraplegia.   Clinical Impression   Pt admitted with the above diagnosis. Pt currently with functional limitations due to the deficits listed below (see OT Problem List). Prior to admit, pt was living with husband at home receiving close to total assist with all ADL/IADL tasks. Personal aide attendant assists 7 days a week (7AM-11PM). RUE is only extremity that has possible function movement. Husband reports that he and the aide complete daily stretches with pt as instructed by prior home health OT.  Pt will benefit from acute skilled OT to increase their safety and independence with BADL to facilitate discharge. Recommend follow up home health OT services to further focus on increasing independence with self feeding and writing tasks. Night time resting hand brace order for right hand due to increased tone. OT to continue to follow patient and see for one more visit once brace arrives to assess fit and provide education to patient and husband on use.        Recommendations for follow up therapy are one component of a  multi-disciplinary discharge planning process, led by the attending physician.  Recommendations may be updated based on patient status, additional functional criteria and insurance authorization.   Assistance Recommended at Discharge Frequent or constant Supervision/Assistance  Patient can return home with the following Two people to help with walking and/or transfers;Two people to help with bathing/dressing/bathroom;Assistance with cooking/housework;Assistance with feeding;Direct supervision/assist for medications management;Direct supervision/assist for financial management;Assist for transportation;Help with stairs or ramp for entrance    Functional Status Assessment  Patient has had a recent decline in their functional status and demonstrates the ability to make significant improvements in function in a reasonable and predictable amount of time.  Equipment Recommendations  None recommended by OT       Precautions / Restrictions Precautions Precautions: Fall Precaution Comments: no active movement L UE and bil LE; recent R LE and L UE fxs Restrictions Weight Bearing Restrictions: Yes LUE Weight Bearing: Weight bearing as tolerated RLE Weight Bearing: Partial weight bearing RLE Partial Weight Bearing Percentage or Pounds: 50% Other Position/Activity Restrictions: Per husband, ortho recommending 50% WB to RLE, however pt is unable to tolerate so maintaining NWB at this time; no restriction on LUE.      Mobility Bed Mobility Overal bed mobility:  (bed level evaluation. See PT eval)    Patient Response: Cooperative  Transfers Overall transfer level:  (No transfer completed this date)           Balance Overall balance assessment: History of Falls     Sitting balance - Comments: With support from [facility], pt able to sitt upright in chair position for session without falling/leaning towards any particular side.      ADL either performed or assessed with clinical  judgement   ADL               General ADL Comments: Pt limited secondary to paraplegia, increased tone, and MS. Uses a hoyer at baseline for functional transfers and assistance from caregivers for ADL/IADLs. Verbalizes recent difficulty with maintain grasp on eating utensils and pens when signing checks. Total A X2 to boost pt up towards HOB. Bed placed in chair position. Trailed a variety of AE for the right hand with the following to be the most successful: Full universal wrist support size regular to hold eating utensil. Eduation provided on proper positioning first of all with right elbow placed on table/counter to provide stability of shoulder. May need assistance with stabbing food d/t lack of wrist pronation with combined shoulder abduction required to complete task. Education also provided on purchase of plate guard to allow pt to scoop food with less difficulty. D-Ring universal cuff along placed within blue built up foam piece with pen used. Seemed to help although lack of velcro prevented strap from being tightened. Large Gear Tie wrapped around pen with access wrapped around back side of palm to assist with hold also used. Suggested use of signature stamp as alternative to signing name if needed.     Vision Baseline Vision/History: 1 Wears glasses Ability to See in Adequate Light: 0 Adequate Patient Visual Report: No change from baseline Vision Assessment?: No apparent visual deficits            Pertinent Vitals/Pain Pain Assessment Pain Assessment: Faces Faces Pain Scale: No hurt     Hand Dominance Right   Extremity/Trunk Assessment Upper Extremity Assessment Upper Extremity Assessment: RUE deficits/detail;LUE deficits/detail RUE Deficits / Details: 3-/5 shoulder flexion, 2-/5 elbow flexion/extension, 3-/5 wrist extension. Able to form a weak gross grasp. PIP joints in digits 2-5 hyper flexed in resting position. Able to extend fingers actively and passively. Unable to maintain nuetral wrist  extension position when placed. LUE Deficits / Details: able to shrug shoulders actively but otherwise MMT of 0 throughout; spastic, holding in extension synergy position, but able to achieve full elbow flexion PROM with repetition and extra time; denies numbness/tingling LUE Sensation: WNL   Lower Extremity Assessment Lower Extremity Assessment: Defer to PT evaluation RLE Deficits / Details: recent R tib/fib fx; MMT 0 throughout LE with attempts at active, purposeful movement; spastic movement resulting in ankle DF/PF and knee flexion at times; PROM limited to ~45 degrees hip flexion ~60 degrees knee flexion; denies numbness/tingling; holds R leg in external rotation and knee flexion at rest, reportedly partly due to her scoliosis LLE Deficits / Details: MMT 0 throughout LE with attempts at active, purposeful movement; spastic movement resulting in ankle DF/PF and knee flexion at times; denies numbness/tingling   Cervical / Trunk Assessment Cervical / Trunk Assessment: Kyphotic;Other exceptions (scoliosis)   Communication Communication Communication: No difficulties   Cognition Arousal/Alertness: Awake/alert Behavior During Therapy: WFL for tasks assessed/performed Overall Cognitive Status: Within Functional Limits for tasks assessed       General Comments  husband and aide present during session            Home Living Family/patient expects to be discharged to:: Private residence Living Arrangements: Spouse/significant other Available Help at Discharge: Family;Personal care attendant;Available 24 hours/day Type of Home: House Home Access: Ramped entrance     Home Layout: Two level;Able to live on main level with bedroom/bathroom Alternate Level Stairs-Number of Steps: pt doesn't access   Bathroom Shower/Tub: Walk-in shower  Home Equipment: Other (comment);Wheelchair - power;BSC/3in1 (hoyer lift, sit to stand lift, adjustable bed)   Additional Comments: aide 7  days/week 7am-11pm      Prior Functioning/Environment Prior Level of Function : Needs assist       Physical Assist : Mobility (physical);ADLs (physical) Mobility (physical): Bed mobility;Transfers ADLs (physical): Feeding;Grooming;Bathing;Dressing;Toileting;IADLs Mobility Comments: recently using hoyer lift to w/c, BSC and shower chair; was using sit to stand lift for transfers prior to fall and R LE fx in December, sit > stand lift does not require LE or UE strength as the sling goes under her armpits and it pulls her dependently up to stand ADLs Comments: Needs assist for all ADL/IADLs; increasing difficulty with grasping utensils and pens to feed self and write with R UE        OT Problem List: Impaired UE functional use      OT Treatment/Interventions: Splinting;Patient/family education;DME and/or AE instruction    OT Goals(Current goals can be found in the care plan section) Acute Rehab OT Goals Patient Stated Goal: to increase her ability to feed herself and sign her name OT Goal Formulation: Patient unable to participate in goal setting Time For Goal Achievement: 02/14/23 Potential to Achieve Goals: Good  OT Frequency: Min 1X/week       AM-PAC OT "6 Clicks" Daily Activity     Outcome Measure Help from another person eating meals?: Total Help from another person taking care of personal grooming?: Total Help from another person toileting, which includes using toliet, bedpan, or urinal?: Total Help from another person bathing (including washing, rinsing, drying)?: Total Help from another person to put on and taking off regular upper body clothing?: Total Help from another person to put on and taking off regular lower body clothing?: Total 6 Click Score: 6   End of Session    Activity Tolerance: Patient tolerated treatment well Patient left: in bed;with call bell/phone within reach;with family/visitor present  OT Visit Diagnosis: Muscle weakness (generalized)  (M62.81);Feeding difficulties (R63.3)                Time: 1519-1600 OT Time Calculation (min): 41 min Charges:  OT General Charges $OT Visit: 1 Visit OT Evaluation $OT Eval High Complexity: 1 High OT Treatments $Self Care/Home Management : 23-37 mins  Limmie Patricia, OTR/L,CBIS  Supplemental OT - [facility] and [facility] Secure Chat Preferred    Garnell Phenix, Charisse March 02/01/2023, 4:29 PM

## 2023-02-02 DIAGNOSIS — G9340 Encephalopathy, unspecified: Secondary | ICD-10-CM | POA: Diagnosis not present

## 2023-02-02 DIAGNOSIS — R443 Hallucinations, unspecified: Secondary | ICD-10-CM | POA: Diagnosis not present

## 2023-02-02 DIAGNOSIS — R5383 Other fatigue: Secondary | ICD-10-CM | POA: Diagnosis not present

## 2023-02-02 LAB — COMPREHENSIVE METABOLIC PANEL
ALT: 63 U/L — ABNORMAL HIGH (ref 0–44)
AST: 35 U/L (ref 15–41)
Albumin: 2.5 g/dL — ABNORMAL LOW (ref 3.5–5.0)
Alkaline Phosphatase: 107 U/L (ref 38–126)
Anion gap: 14 (ref 5–15)
BUN: 32 mg/dL — ABNORMAL HIGH (ref 6–20)
CO2: 21 mmol/L — ABNORMAL LOW (ref 22–32)
Calcium: 8.2 mg/dL — ABNORMAL LOW (ref 8.9–10.3)
Chloride: 104 mmol/L (ref 98–111)
Creatinine, Ser: 1.1 mg/dL — ABNORMAL HIGH (ref 0.44–1.00)
GFR, Estimated: 58 mL/min — ABNORMAL LOW (ref >60)
Glucose, Bld: 120 mg/dL — ABNORMAL HIGH (ref 70–99)
Potassium: 3.2 mmol/L — ABNORMAL LOW (ref 3.5–5.1)
Sodium: 139 mmol/L (ref 135–145)
Total Bilirubin: 0.5 mg/dL (ref 0.3–1.2)
Total Protein: 4.9 g/dL — ABNORMAL LOW (ref 6.5–8.1)

## 2023-02-02 LAB — URINE CULTURE: Culture: <10000 — AB

## 2023-02-02 LAB — ALDOLASE: Aldolase: 4.8 U/L (ref 3.3–10.3)

## 2023-02-02 MED ORDER — MUPIROCIN 2 % EX OINT
TOPICAL_OINTMENT | Freq: Two times a day (BID) | CUTANEOUS | Status: DC
  Filled 2023-02-02: qty 22

## 2023-02-02 NOTE — TOC Progression Note (Signed)
Transition of Care (TOC) - Progression Note   Received email from [facility] medical with what they have available and prices, again was told insurance only covers manuel hoyer lift and manuel sit to stand, not electric. NCM printed email and left with patient . Also explained to patient [employer] willing to submit to insurance for fully electric hoyer lift ,but was told most likely insurance will not cover. [employer] does not have electric sit to stand.   Discussed above with patient and her caregiver. Left copy of email from [facility] with patient . Called patient's husband Tresa Endo and left voicemail with NCM direct call back number  Patient Details  Name: Vanessa Short MRN: [id number] Date of Birth: [birth year]  Transition of Care [facility] CM/SW Contact  Vanessa Short, Vanessa Devon, RN Phone Number: 02/02/2023, 10:41 AM  Clinical Narrative:       Expected Discharge Plan: Home w Home Health Services    Expected Discharge Plan and Services   Discharge Planning Services: CM Consult   Living arrangements for the past 2 months: Single Family Home                                       Social Determinants of Health (SDOH) Interventions SDOH Screenings   Food Insecurity: No Food Insecurity (01/26/2023)  Housing: Patient Declined (01/26/2023)  Transportation Needs: No Transportation Needs (01/26/2023)  Utilities: Not At Risk (01/26/2023)  Tobacco Use: Low Risk  (01/31/2023)    Readmission Risk Interventions     No data to display

## 2023-02-02 NOTE — Plan of Care (Signed)

## 2023-02-02 NOTE — Progress Notes (Signed)
Neurology Progress Note  Brief HPI: 57 y.o. female with a past medical history of multiple sclerosis, elevated GC levels, spastic quadriparesis, depression, ulcerative colitis, heart failure as well as history of delirium associated with urinary tract infections who presents with confusion. She was seen on 6/14 in the ED for episodes of behavioral arrest and hallucinations and she was seen by neurology then. Her husband states that her staring spells have worsened but she does not seem to be having hallucinations. She was prescribed Flagyl, doxycycline, and cefdinir for an abscess in her nares that was MRSA positive.  These were then de-escalated to just doxycycline and mupirocin cream.  She is currently on acyclovir, baclofen, dantrolene, diazepam, Trileptal, teriflunomide for her MS. Additionally she has been on on 20mg  of baclofen QID (reduced this hospitalization to 15 mg baclofen 4 times daily), seroquel 25mg  daily, prozac 50mg  daily, oxcarbazepine 150mg  TID, valium 5mg  daily. During last admission EEG was negative for seizure activity. Video of spells appear to show her falling asleep during conversation with roving eye movements, which is similar to our examination on admission. No twitching noted in video or during exam. She arouses for brief periods prior to falling back to sleep. BP has been stable during spells and she is protecting her airway.   Subjective: Patient seen in room. She is currently receiving rocephin for possible UTI.  Patient and aide at bedside agree that she is back to her normal  Patient and aide do not feel that she has had any increase in spasms with reduction of her baclofen She did have an episode of burning pain in her right leg last night which resolved with Tylenol and sleep.  Exam: Vitals:   02/02/23 0321 02/02/23 0741  BP: (!) 150/72 134/71  Pulse: 81 74  Resp: 18 16  Temp: 97.6 F (36.4 C) (!) 97 F (36.1 C)  SpO2: 100% 100%   Gen: In bed, NAD Resp:  non-labored breathing, no acute distress Abd: soft, nt  NEURO:  Mental Status: Awake and alert oriented x4, does not remember her time in the ED  Language: speech is clear.  Intact naming, repetition, fluency, and comprehension. Cranial Nerves:  Control and instrumentation engineer, face is symmetric, hearing intact, tongue midline Motor:  RUE:  2-3/5  able to straighten fingers LUE: 1/5 RLE: 0/5        LLE: 0/5  Sensation- Intact to light touch bilaterally Coordination and gait: Able to give a high-five with her right upper extremity  Pertinent Labs:  Latest Reference Range & Units 03/22/22 14:35 10/18/22 13:24 11/29/22 13:48 01/10/23 12:11 01/22/23 16:12 01/25/23 22:31 01/26/23 04:57 01/31/23 15:35 02/01/23 07:49  Alkaline Phosphatase 38 - 126 U/L 98 130 (H) 133 (H) 135 (H) 110 107 114 142 (H) 126  Albumin 3.5 - 5.0 g/dL 4.3 4.1 4.0 4.0 3.1 (L) 3.3 (L) 2.9 (L) 3.2 (L) 2.9 (L)  AST 15 - 41 U/L 16 23 26 22 25 29  42 (H) 64 (H) 65 (H)  ALT 0 - 44 U/L 22 37 (H) 34 (H) 33 (H) 35 40 39 83 (H) 83 (H)  (H): Data is abnormally high (L): Data is abnormally low  Alk Phos 142 Albumin 3.2 AST/ALT 64/83 Oxcarb level- pending  Ammonia- <10 LDH normal CK appropriately low given her muscle mass (13) GGT 119 Aldolase in process  Imaging Reviewed: MRI Brain - No acute intracranial process. Redemonstrated extensive demyelinating lesions in the bilateral cerebral hemispheres. No evidence of diffusion restriction or contrast enhancement to suggest  active demyelination. Plaque burden is severe, but similar to prior exam from 2022.  Assessment:  Exam now at her baseline  On review of outpatient notes, she was reporting worsening hallucinations since March, which correlates with worsening Alk Phos and ALT. These subsequently normalized in June but are now again elevated.  GGT elevation suggest this is hepatic injury, with normal CK (low given her low muscle mass).  Aubagio is associated with increased serum alanine  aminotransferase (13% to 15% per [employer]).  Dr. Epimenio Foot is in agreement with holding Aubagio until follow-up with him.  Aide at bedside does feel that her symptoms are worsening may correlate well with initiation of Aubagio, although we discussed we have not confirmed a causal association at this time  Certainly patient does have features of sleep apnea on exam (witnessed apneas when sleepy and snoring respirations) -- however, given her limited mobility CPAP mask would be high risk. Outpatient referral for evaluation of potential utility of INSPIRE or other options may be helpful overall  We discussed that the burning pain she experienced could be evidence of a relapse but given it was short-lived and she is back at her baseline, as well as the level of baseline debility in her legs, patient preferred not to pursue MRI of her C and T-spine at this time.  Alternatively this pain may be related to her prior lesions noting that oxcarbazepine was started for pain per review of outpatient neurology notes, although she denies similar pain in the past  Impression: Toxic metabolic encephalopathy causing delirium secondary to elevated transaminases and polypharmacy and potential component of sleep apnea Secondary progressive MS still felt to be active with intermittent relpases, without evidence of active relapse at this time   Recommendations: - Urinalysis is pending- UTI treatment per primary team, noting that she does have a suprapubic catheter - Delirium precautions  - Continue baclofen at reduced dose of 15 mg QID  - Consider outpatient referral for possible consideration of Inspire (patient/husband request) - Continue to hold statin and Aubagio at this time, potential reinitiation to be discussed on an outpatient basis - Outpatient may benefit from further tapering of baclofen and/or oxcarbazepine and other sedating medications, defer to Dr. Epimenio Foot - Recommendations conveyed to primary team via secure  chat and discussed with patient and aide at bedside - Inpatient neuro will be available as needed, please do not hesitate to reach out if new questions or concerns arise  Attending Neurologist's note:  I personally saw this patient, gathering history, performing a full neurologic examination, reviewing relevant labs, personally reviewing relevant imaging including MRI brain, and formulated the assessment and plan, adding the note above for completeness and clarity to accurately reflect my thoughts  Brooke Dare MD-PhD [facility] [phone] Available 7 AM to 7 PM, outside these hours please contact Neurologist on call listed on AMION   Greater than 35 minutes spent in care of this patient, majority at bedside

## 2023-02-02 NOTE — Progress Notes (Signed)
Pt stated she doesn't wear CPAP at home. Pt also stressed the fact that she can't not use her arms to be able to take mask off if needed. Pt not placed on CPAP for the night.

## 2023-02-02 NOTE — Progress Notes (Signed)
[facility]                                                                              Vanessa Short, is a 57 y.o. female, DOB - 11-03-1965, ZOX:[id number] Admit date - 01/31/2023    Outpatient Primary MD for the patient is Richmond Campbell., [name]  LOS - 0  days  Chief Complaint  Patient presents with   Fatigue   Hallucinations       Brief summary   Patient is a 57 year old female with multiple sclerosis, HTN, peripheral neuropathy, ulcerative colitis, urolithiasis presented with acute onset of altered mental status, excessive sleepiness, increased visual and auditory hallucinations at home and significant lethargy on the morning of admission. Per patient's husband, she slept all day a day before the admission and at 1 AM she was having visual hallucination and seeing people in the house.  No new paresthesias or focal muscle weakness, headache or dizziness or blurred vision.  No cough or wheezing or dyspnea.  She has a suprapubic catheter, with no significant urinary symptoms. Patient was recently admitted to 01/25/2023-01/28/2023 and was treated for delirium, toxic encephalopathy, seizure-like activity with nonacute EEG had MRSA right nasal nasal infection, recommended mupirocin and oral doxycycline for 7 to 10 days and outpatient follow-up with ENT.  UA suspicious for UTI.  MRI brain showed no acute intracranial process.  Redemonstrated extensive demyelination lesions in the bilateral cerebral hemispheres, no active demyelination  Patient was admitted for further workup.  Neurology was consulted  Assessment & Plan    Principal Problem:   Acute encephalopathy -Presented with delirium, visual and auditory hallucinations, likely due to UTI and partly due to polypharmacy -Mental status is improving, continue IV Rocephin - baclofen decreased to 15 mg 4 times daily, can be slowly titrated down outpatient -LFTs improving, neurology following   Active Problems:    Acute lower UTI -Urine culture showed less than 10,000 colonies, for now continue IV Rocephin    Obstructive sleep apnea -Possibly contributing to her symptoms -Patient declined CPAP  Anxiety, depression -Continue Valium, Prozac    Multiple sclerosis (HCC) -Continue baclofen at lower dose, continue dantrolene Valium, Trileptal and teriflunomide -At baseline, wheelchair-bound, bilateral lower extremity weakness with stiffness, RUE strength greater than LUE - has home health aide.  -Neurology discussed with outpatient neurologist, hold Aubagio due to transaminitis     Ulcerative colitis (HCC) -Continue mesalamine    Hypertension -BP stable    Dyslipidemia -Hold statin due to transaminitis    Transaminitis -Trending down, continue to hold statin, CK13, LDH 141, GGT elevated 119  -Per neurology Dr Iver Nestle, Ashok Cordia is associated with transaminitis, hold   Obesity Estimated body mass index is 31.89 kg/m as calculated from the following:   Height as of this encounter: 5\' 3"  (1.6 m).   Weight as of this encounter: 81.6 kg.  Code Status: Full code DVT Prophylaxis:  enoxaparin (LOVENOX) injection 40 mg Start: 01/31/23 2000   Level of Care: Level of care: Telemetry Medical Family Communication: Updated patient's husband at the bedside Disposition Plan:      Remains inpatient appropriate: Hopefully DC tomorrow if continues to  improve   Procedures:    Consultants:   Neurology  Antimicrobials:   Anti-infectives (From admission, onward)    Start     Dose/Rate Route Frequency Ordered Stop   01/31/23 2045  cefTRIAXone (ROCEPHIN) 1 g in sodium chloride 0.9 % 100 mL IVPB        1 g 200 mL/hr over 30 Minutes Intravenous Every 24 hours 01/31/23 2035            Medications  aspirin EC  81 mg Oral Daily   B-complex with vitamin C  1 tablet Oral Daily   baclofen  15 mg Oral QID   Chlorhexidine Gluconate Cloth  6 each Topical Daily   cholecalciferol  1,000 Units Oral  Daily   dantrolene  50 mg Oral TID   enoxaparin (LOVENOX) injection  40 mg Subcutaneous Q24H   famotidine  40 mg Oral BID   FLUoxetine  20 mg Oral TID   folic acid  1 mg Oral Daily   furosemide  40 mg Oral BID   labetalol  50 mg Oral Daily   mesalamine  0.375 g Oral QID   multivitamin with minerals  1 tablet Oral Daily   OXcarbazepine  150 mg Oral TID   potassium chloride  20 mEq Oral Daily   QUEtiapine  25 mg Oral QHS   Teriflunomide  14 mg Oral Daily   tolterodine  2 mg Oral BID      Subjective:   Vanessa Short was seen and examined today.  Mental status continues to improve, currently at baseline, husband at the bedside.  No complaints of visual or auditory hallucinations.  Patient declines CPAP  Objective:   Vitals:   02/01/23 2321 02/02/23 0321 02/02/23 0741 02/02/23 1143  BP: 117/72 (!) 150/72 134/71 102/61  Pulse: 85 81 74 71  Resp: 18 18 16 16   Temp: 98.2 F (36.8 C) 97.6 F (36.4 C) (!) 97 F (36.1 C) (!) 97 F (36.1 C)  TempSrc: Oral Oral Oral Oral  SpO2: 98% 100% 100% 100%  Weight:      Height:        Intake/Output Summary (Last 24 hours) at 02/02/2023 1252 Last data filed at 02/02/2023 0900 Gross per 24 hour  Intake 755 ml  Output 1600 ml  Net -845 ml     Wt Readings from Last 3 Encounters:  01/31/23 81.6 kg  01/25/23 81.6 kg  01/22/23 81.6 kg    Physical Exam General: Alert and oriented x 3, NAD Cardiovascular: S1 S2 clear, RRR.  Respiratory: CTAB, no wheezing, Gastrointestinal: Soft, nontender, nondistended, NBS Ext: no pedal edema bilaterally Neuro: no new deficits today Psych: Normal affect    Data Reviewed:  I have personally reviewed following labs    CBC Lab Results  Component Value Date   WBC 7.2 02/01/2023   RBC 3.98 02/01/2023   HGB 11.6 (L) 02/01/2023   HCT 37.9 02/01/2023   MCV 95.2 02/01/2023   MCH 29.1 02/01/2023   PLT 147 (L) 02/01/2023   MCHC 30.6 02/01/2023   RDW 16.7 (H) 02/01/2023   LYMPHSABS 0.8  01/31/2023   MONOABS 1.2 (H) 01/31/2023   EOSABS 0.1 01/31/2023   BASOSABS 0.1 01/31/2023     Last metabolic panel Lab Results  Component Value Date   NA 139 02/02/2023   K 3.2 (L) 02/02/2023   CL 104 02/02/2023   CO2 21 (L) 02/02/2023   BUN 32 (H) 02/02/2023   CREATININE 1.10 (H) 02/02/2023   GLUCOSE  120 (H) 02/02/2023   GFRNONAA 58 (L) 02/02/2023   GFRAA 79 03/16/2020   CALCIUM 8.2 (L) 02/02/2023   PHOS 2.4 (L) 08/06/2016   PROT 4.9 (L) 02/02/2023   ALBUMIN 2.5 (L) 02/02/2023   LABGLOB 2.3 01/10/2023   AGRATIO 1.7 01/10/2023   BILITOT 0.5 02/02/2023   ALKPHOS 107 02/02/2023   AST 35 02/02/2023   ALT 63 (H) 02/02/2023   ANIONGAP 14 02/02/2023    CBG (last 3)  Recent Labs    01/31/23 1156  GLUCAP 123*      Coagulation Profile: No results for input(s): "INR", "PROTIME" in the last 168 hours.   Radiology Studies: I have personally reviewed the imaging studies  MR Brain W and Wo Contrast  Result Date: 01/31/2023 CLINICAL DATA:  Altered mental status, nontraumatic (Ped 0-17y) hallucinations, hx of MS, ?aphasia EXAM: MRI HEAD WITHOUT AND WITH CONTRAST TECHNIQUE: Multiplanar, multiecho pulse sequences of the brain and surrounding structures were obtained without and with intravenous contrast. CONTRAST:  8mL GADAVIST GADOBUTROL 1 MMOL/ML IV SOLN COMPARISON:  MR Head 11/29/20 FINDINGS: Brain: Negative for an acute infarct. No hydrocephalus. No extra-axial fluid collection. No hemorrhage. Redemonstrated extensive demyelinating lesions in the bilateral cerebral hemispheres. No evidence of diffusion restriction or contrast enhancement to suggest demyelination. No evidence of hemorrhage, but assessment of the susceptibility weighted sequences is limited due to motion artifact. Generalized volume loss. Vascular: Normal flow voids. Skull and upper cervical spine: Normal marrow signal. Sinuses/Orbits: No middle ear or mastoid effusion. Paranasal sinuses are clear. Orbits are  unremarkable. Other: None. IMPRESSION: 1. No acute intracranial process. 2. Redemonstrated extensive demyelinating lesions in the bilateral cerebral hemispheres. No evidence of diffusion restriction or contrast enhancement to suggest active demyelination. Plaque burden is severe, but similar to prior exam from 2022. Electronically Signed   By: Lorenza Cambridge M.D.   On: 01/31/2023 19:08   DG Chest Portable 1 View  Result Date: 01/31/2023 CLINICAL DATA:  Altered mental status EXAM: PORTABLE CHEST 1 VIEW COMPARISON:  X-ray 01/25/2023 FINDINGS: Stable right IJ chest port with tip along the central SVC. Film is rotated to the left. Enlarged cardiopericardial silhouette. Calcified aorta. No pneumothorax or edema. Small left effusion. Osteopenia. Overlapping cardiac leads. IMPRESSION: Stable chest port.  Enlarged heart.  Film is rotated to the left. Question small left effusion Electronically Signed   By: Karen Kays M.D.   On: 01/31/2023 16:15       Vanessa Short M.D. [facility] 02/02/2023, 12:52 PM  Available via Epic secure chat 7am-7pm After 7 pm, please refer to night coverage provider listed on amion.

## 2023-02-03 DIAGNOSIS — R443 Hallucinations, unspecified: Secondary | ICD-10-CM | POA: Diagnosis not present

## 2023-02-03 DIAGNOSIS — R5383 Other fatigue: Secondary | ICD-10-CM | POA: Diagnosis not present

## 2023-02-03 DIAGNOSIS — G35 Multiple sclerosis: Secondary | ICD-10-CM | POA: Diagnosis not present

## 2023-02-03 DIAGNOSIS — G9340 Encephalopathy, unspecified: Secondary | ICD-10-CM | POA: Diagnosis not present

## 2023-02-03 MED ORDER — BACLOFEN 15 MG PO TABS: 15.0000 mg | ORAL_TABLET | Freq: Four times a day (QID) | ORAL | 1 refills | Status: DC

## 2023-02-03 MED ORDER — ONDANSETRON HCL 4 MG PO TABS: 4.0000 mg | ORAL_TABLET | Freq: Three times a day (TID) | ORAL | 0 refills | Status: DC | PRN

## 2023-02-03 MED ORDER — POTASSIUM CHLORIDE CRYS ER 20 MEQ PO TBCR
20.0000 meq | EXTENDED_RELEASE_TABLET | Freq: Once | ORAL | Status: AC
  Administered 2023-02-03: 20 meq via ORAL

## 2023-02-03 MED ORDER — HEPARIN SOD (PORK) LOCK FLUSH 100 UNIT/ML IV SOLN
500.0000 [IU] | INTRAVENOUS | Status: AC | PRN
  Administered 2023-02-03: 500 [IU]

## 2023-02-03 MED ORDER — CEPHALEXIN 250 MG PO CAPS: 250.0000 mg | ORAL_CAPSULE | Freq: Two times a day (BID) | ORAL | 0 refills | Status: AC

## 2023-02-03 MED ORDER — TERIFLUNOMIDE 14 MG PO TABS
1.0000 | ORAL_TABLET | Freq: Every day | ORAL | 3 refills | Status: DC
Start: 2023-02-03 — End: 2023-04-21

## 2023-02-03 NOTE — Plan of Care (Signed)
  Problem: Education: Goal: Knowledge of General Education information will improve Description: Including pain rating scale, medication(s)/side effects and non-pharmacologic comfort measures 02/03/2023 0700 by Gerlean Ren, RN Outcome: Progressing 02/03/2023 0700 by Gerlean Ren, RN Outcome: Progressing   Problem: Health Behavior/Discharge Planning: Goal: Ability to manage health-related needs will improve 02/03/2023 0700 by Gerlean Ren, RN Outcome: Progressing 02/03/2023 0700 by Gerlean Ren, RN Outcome: Progressing   Problem: Clinical Measurements: Goal: Ability to maintain clinical measurements within normal limits will improve 02/03/2023 0700 by Gerlean Ren, RN Outcome: Progressing 02/03/2023 0700 by Gerlean Ren, RN Outcome: Progressing Goal: Will remain free from infection 02/03/2023 0700 by Gerlean Ren, RN Outcome: Progressing 02/03/2023 0700 by Gerlean Ren, RN Outcome: Progressing Goal: Diagnostic test results will improve 02/03/2023 0700 by Gerlean Ren, RN Outcome: Progressing 02/03/2023 0700 by Gerlean Ren, RN Outcome: Progressing Goal: Respiratory complications will improve 02/03/2023 0700 by Gerlean Ren, RN Outcome: Progressing 02/03/2023 0700 by Gerlean Ren, RN Outcome: Progressing Goal: Cardiovascular complication will be avoided 02/03/2023 0700 by Gerlean Ren, RN Outcome: Progressing 02/03/2023 0700 by Gerlean Ren, RN Outcome: Progressing   Problem: Activity: Goal: Risk for activity intolerance will decrease 02/03/2023 0700 by Gerlean Ren, RN Outcome: Progressing 02/03/2023 0700 by Gerlean Ren, RN Outcome: Progressing   Problem: Nutrition: Goal: Adequate nutrition will be maintained 02/03/2023 0700 by Gerlean Ren, RN Outcome: Progressing 02/03/2023 0700 by Gerlean Ren, RN Outcome: Progressing   Problem: Coping: Goal: Level of anxiety will  decrease 02/03/2023 0700 by Gerlean Ren, RN Outcome: Progressing 02/03/2023 0700 by Gerlean Ren, RN Outcome: Progressing   Problem: Elimination: Goal: Will not experience complications related to bowel motility 02/03/2023 0700 by Gerlean Ren, RN Outcome: Progressing 02/03/2023 0700 by Gerlean Ren, RN Outcome: Progressing Goal: Will not experience complications related to urinary retention 02/03/2023 0700 by Gerlean Ren, RN Outcome: Progressing 02/03/2023 0700 by Gerlean Ren, RN Outcome: Progressing   Problem: Pain Managment: Goal: General experience of comfort will improve 02/03/2023 0700 by Gerlean Ren, RN Outcome: Progressing 02/03/2023 0700 by Gerlean Ren, RN Outcome: Progressing   Problem: Safety: Goal: Ability to remain free from injury will improve 02/03/2023 0700 by Gerlean Ren, RN Outcome: Progressing 02/03/2023 0700 by Gerlean Ren, RN Outcome: Progressing   Problem: Skin Integrity: Goal: Risk for impaired skin integrity will decrease 02/03/2023 0700 by Gerlean Ren, RN Outcome: Progressing 02/03/2023 0700 by Gerlean Ren, RN Outcome: Progressing

## 2023-02-03 NOTE — Discharge Summary (Addendum)
Physician Discharge Summary   Short: Vanessa Short MRN: [id number] DOB: 07/11/1966  Admit date:     01/31/2023  Discharge date: 02/03/23  Discharge Physician: Thad Ranger   PCP: Richmond Campbell., [name]   Recommendations at discharge:   Hold Aubagio until follow-up with outpatient neurology Hold statin Follow LFTs in 1 to 2 weeks outpatient Keflex 250 mg p.o. twice daily for 3 days Baclofen decreased to 15 mg 4 times daily, wean down outpatient per neurology   Discharge Diagnoses:    Acute metabolic encephalopathy   Acute lower UTI    Transaminitis   Obstructive sleep apnea   Multiple sclerosis (HCC)   Ulcerative colitis (HCC)   Hypertension   Anxiety and depression   Dyslipidemia    Hospital Course:  Short is a 57 year old female with multiple sclerosis, HTN, peripheral neuropathy, ulcerative colitis, urolithiasis presented with acute onset of altered mental status, excessive sleepiness, increased visual and auditory hallucinations at home and significant lethargy on Vanessa morning of admission. Per Short's husband, she slept all day a day before Vanessa admission and at 1 AM she was having visual hallucination and seeing people in Vanessa house.  No new paresthesias or focal muscle weakness, headache or dizziness or blurred vision.  No cough or wheezing or dyspnea.  She has a suprapubic catheter, with no significant urinary symptoms. Short was recently admitted to 01/25/2023-01/28/2023 and was treated for delirium, toxic encephalopathy, seizure-like activity with nonacute EEG had MRSA right nasal nasal infection, recommended mupirocin and oral doxycycline for 7 to 10 days and outpatient follow-up with ENT.   UA suspicious for UTI.  MRI brain showed no acute intracranial process.  Redemonstrated extensive demyelination lesions in Vanessa bilateral cerebral hemispheres, no active demyelination   Short was admitted for further workup.  Neurology was consulted  Assessment and  Plan:  Acute encephalopathy -Presented with delirium, visual and auditory hallucinations, likely due to UTI and partly due to polypharmacy -Mental status improved, close to baseline. - baclofen decreased to 15 mg 4 times daily, can be slowly titrated down outpatient -LFTs improving, neurology following     Acute lower UTI -Urine culture showed less than 10,000 colonies, Short was placed on IV Rocephin inpatient.  Given significant improvement in mental status, will continue Keflex for 3 days.     Obstructive sleep apnea -Possibly contributing to her symptoms -Short declined CPAP   Anxiety, depression -Continue Valium, Prozac     Multiple sclerosis (HCC) -Continue baclofen at lower dose, 15mg  4 times daily continue dantrolene Valium, Trileptal and teriflunomide -At baseline, wheelchair-bound, bilateral lower extremity weakness with stiffness, RUE strength greater than LUE - has home health aide.  -Neurology discussed with outpatient neurologist, hold Aubagio due to transaminitis       Ulcerative colitis (HCC) -Continue mesalamine     Hypertension -BP stable     Dyslipidemia -Hold statin due to transaminitis     Transaminitis -Trending down, continue to hold statin, CK13, LDH 141, GGT elevated 119  -Per neurology Dr Iver Nestle, Ashok Cordia is associated with transaminitis, hold     Obesity Estimated body mass index is 31.89 kg/m as calculated from Vanessa following:   Height as of this encounter: 5\' 3"  (1.6 m).   Weight as of this encounter: 81.6 kg.     Pain control - [employer] Controlled Substance Reporting System database was reviewed. and Short was instructed, not to drive, operate heavy machinery, perform activities at heights, swimming or participation in water activities or provide baby-sitting services  while on Pain, Sleep and Anxiety Medications; until their outpatient Physician has advised to do so again. Also recommended to not to take more than prescribed  Pain, Sleep and Anxiety Medications.  Consultants: Neurology Procedures performed: None Disposition: Home Diet recommendation:  Discharge Diet Orders (From admission, onward)     Start     Ordered   02/03/23 0000  Diet general        02/03/23 1041           DISCHARGE MEDICATION: Allergies as of 02/03/2023       Reactions   Nitrofurantoin Other (See Comments)   syncope   Oxycodone-acetaminophen Other (See Comments)   Other   Risperidone And Related    Became very lethargic   Penicillins Rash   Has Short had a PCN reaction causing immediate rash, facial/tongue/throat swelling, SOB or lightheadedness with hypotension: NO Has Short had a PCN reaction causing severe rash involving mucus membranes or skin necrosis: NO Has Short had a PCN reaction that required hospitalization NO Has Short had a PCN reaction occurring within Vanessa last 10 years:NO If all of Vanessa above answers are "NO", then may proceed with Cephalosporin use.        Medication List     STOP taking these medications    acetaminophen 500 MG tablet Commonly known as: TYLENOL   rosuvastatin 40 MG tablet Commonly known as: CRESTOR       TAKE these medications    acyclovir 400 MG tablet Commonly known as: ZOVIRAX Take 400 mg by mouth daily. Take every day per Short   aspirin EC 81 MG tablet Take 81 mg by mouth daily.   b complex vitamins tablet Take 1 tablet by mouth daily.   Baclofen 15 MG Tabs Take 15 mg by mouth 4 (four) times daily. What changed:  medication strength how much to take how to take this when to take this additional instructions   cephALEXin 250 MG capsule Commonly known as: KEFLEX Take 1 capsule (250 mg total) by mouth 2 (two) times daily for 3 days.   cholecalciferol 1000 units tablet Commonly known as: VITAMIN D Take 1,000 Units by mouth daily.   dantrolene 50 MG capsule Commonly known as: DANTRIUM Take 1 capsule (50 mg total) by mouth in Vanessa morning, at  noon, in Vanessa evening, and at bedtime. What changed: when to take this   diazepam 5 MG tablet Commonly known as: Valium One po qHS   doxycycline 100 MG capsule Commonly known as: VIBRAMYCIN Take 1 capsule (100 mg total) by mouth 2 (two) times daily.   famotidine 20 MG tablet Commonly known as: PEPCID Take 40 mg by mouth 2 (two) times daily.   FLUoxetine 20 MG capsule Commonly known as: PROZAC Take 20 mg by mouth 3 (three) times daily.   folic acid 400 MCG tablet Commonly known as: FOLVITE Take 800 mcg by mouth daily.   furosemide 40 MG tablet Commonly known as: LASIX Take 1 tablet (40 mg total) by mouth 2 (two) times daily.   labetalol 100 MG tablet Commonly known as: NORMODYNE TAKE 1/2 TABLET BY MOUTH DAILY   lidocaine-prilocaine cream Commonly known as: EMLA Apply one inch before port-a cath access prn What changed:  how much to take how to take this when to take this reasons to take this additional instructions   lubiprostone 8 MCG capsule Commonly known as: AMITIZA Take 8-16 mcg by mouth 2 (two) times daily with a meal. Take  in Vanessa  morning and 16 mcg in Vanessa afternoon.   mesalamine 0.375 g 24 hr capsule Commonly known as: APRISO Take 375 mg by mouth QID.   multivitamin tablet Take 1 tablet by mouth daily.   mupirocin ointment 2 % Commonly known as: BACTROBAN Apply 1 Application topically 2 (two) times daily.   ondansetron 4 MG tablet Commonly known as: ZOFRAN Take 1 tablet (4 mg total) by mouth every 8 (eight) hours as needed for nausea.   OXcarbazepine 150 MG tablet Commonly known as: TRILEPTAL Take 1 tablet (150 mg total) by mouth 3 (three) times daily.   potassium chloride 10 MEQ tablet Commonly known as: KLOR-CON Take 20 mEq by mouth daily.   QUEtiapine 25 MG tablet Commonly known as: SEROQUEL Take 1 tablet (25 mg total) by mouth at bedtime.   sodium chloride 0.65 % Soln nasal spray Commonly known as: OCEAN Place 1 spray into both  nostrils as needed for congestion. What changed: when to take this   Teriflunomide 14 MG Tabs Take 1 tablet (14 mg total) by mouth daily. HOLD until cleared by neurology outpatient to resume What changed: additional instructions   tolterodine 2 MG tablet Commonly known as: DETROL Take 2 mg by mouth 2 (two) times daily.        Follow-up Information     Richmond Campbell., [name]. Schedule an appointment as soon as possible for a visit in 2 week(s).   Specialty: Family Medicine Why: for hospital follow-up Contact information: [address] [place] [address] [phone]         Asa Lente, MD. Schedule an appointment as soon as possible for a visit in 2 week(s).   Specialty: Neurology Why: for hospital follow-up Contact information: [address] [place] [address] [phone]                Discharge Exam: Ceasar Mons Weights   01/31/23 1129  Weight: 81.6 kg   S: No acute complaints, mental status close to baseline.  Husband at Vanessa bedside.  No fevers.  BP 135/68 (BP Location: Left Arm)   Pulse 80   Temp 97.8 F (36.6 C) (Oral)   Resp 18   Ht 5\' 3"  (1.6 m)   Wt 81.6 kg   SpO2 100%   BMI 31.89 kg/m   Physical Exam General: Alert and oriented x 3, NAD, pleasant Cardiovascular: S1 S2 clear, RRR.  Respiratory: CTAB, no wheezing Gastrointestinal: Soft, nontender, nondistended, NBS Ext: no pedal edema bilaterally Neuro: no new deficits Psych: Normal affect    Condition at discharge: fair  Vanessa results of significant diagnostics from this hospitalization (including imaging, microbiology, ancillary and laboratory) are listed below for reference.   Imaging Studies: MR Brain W and Wo Contrast  Result Date: 01/31/2023 CLINICAL DATA:  Altered mental status, nontraumatic (Ped 0-17y) hallucinations, hx of MS, ?aphasia EXAM: MRI HEAD WITHOUT AND WITH CONTRAST TECHNIQUE: Multiplanar, multiecho pulse sequences of Vanessa brain and surrounding  structures were obtained without and with intravenous contrast. CONTRAST:  8mL GADAVIST GADOBUTROL 1 MMOL/ML IV SOLN COMPARISON:  MR Head 11/29/20 FINDINGS: Brain: Negative for an acute infarct. No hydrocephalus. No extra-axial fluid collection. No hemorrhage. Redemonstrated extensive demyelinating lesions in Vanessa bilateral cerebral hemispheres. No evidence of diffusion restriction or contrast enhancement to suggest demyelination. No evidence of hemorrhage, but assessment of Vanessa susceptibility weighted sequences is limited due to motion artifact. Generalized volume loss. Vascular: Normal flow voids. Skull and upper cervical spine: Normal marrow signal. Sinuses/Orbits: No middle ear  or mastoid effusion. Paranasal sinuses are clear. Orbits are unremarkable. Other: None. IMPRESSION: 1. No acute intracranial process. 2. Redemonstrated extensive demyelinating lesions in Vanessa bilateral cerebral hemispheres. No evidence of diffusion restriction or contrast enhancement to suggest active demyelination. Plaque burden is severe, but similar to prior exam from 2022. Electronically Signed   By: Lorenza Cambridge M.D.   On: 01/31/2023 19:08   DG Chest Portable 1 View  Result Date: 01/31/2023 CLINICAL DATA:  Altered mental status EXAM: PORTABLE CHEST 1 VIEW COMPARISON:  X-ray 01/25/2023 FINDINGS: Stable right IJ chest port with tip along Vanessa central SVC. Film is rotated to Vanessa left. Enlarged cardiopericardial silhouette. Calcified aorta. No pneumothorax or edema. Small left effusion. Osteopenia. Overlapping cardiac leads. IMPRESSION: Stable chest port.  Enlarged heart.  Film is rotated to Vanessa left. Question small left effusion Electronically Signed   By: Karen Kays M.D.   On: 01/31/2023 16:15   Overnight EEG with video  Result Date: 01/26/2023 Charlsie Quest, MD     01/27/2023  8:14 AM Short Name: THECLA FORGIONE MRN: [id number] Epilepsy Attending: Charlsie Quest Referring Physician/Provider: Rejeana Brock, MD  Duration: 01/26/2023 0500 to 01/27/2023 0500 Short history: 57 year old female with fairly advanced multiple sclerosis who presents with hallucinations, confusion and today episodes of behavioral arrest with eye deviation. EEG to evaluate for seizure. Level of alertness: Awake, asleep AEDs during EEG study: [employer] Technical aspects: This EEG study was done with scalp electrodes positioned according to Vanessa 10-20 International system of electrode placement. Electrical activity was reviewed with band pass filter of 1-70Hz , sensitivity of 7 uV/mm, display speed of 4mm/sec with a 60Hz  notched filter applied as appropriate. EEG data were recorded continuously and digitally stored.  Video monitoring was available and reviewed as appropriate. Description: Vanessa posterior dominant rhythm consists of 7 Hz activity of moderate voltage (25-35 uV) seen predominantly in posterior head regions, symmetric and reactive to eye opening and eye closing. Sleep was characterized by sleep spindles (12-14hz ), maximal fronto-central region. EEG showed near continuous generalized polymorphic sharply contoured 5 to 6 Hz theta slowing admixed with intermittent 2-3hz  delta slowing, at times with triphasic morphology. Brief 1-2 seconds of generalized attenuation were also noted. Hyperventilation and photic stimulation were not performed.   ABNORMALITY - Continuous slow, generalized - Background slow IMPRESSION: This study is suggestive of moderate diffuse encephalopathy, nonspecific etiology. No seizures or definite epileptiform discharges were seen throughout Vanessa recording. Charlsie Quest   DG Chest Portable 1 View  Result Date: 01/25/2023 CLINICAL DATA:  Altered mental status, shortness of breath. EXAM: PORTABLE CHEST 1 VIEW COMPARISON:  June 23, 2018 FINDINGS: There is stable right-sided venous Port-A-Cath positioning. Vanessa heart size and mediastinal contours are within normal limits. There is no evidence of acute infiltrate, pleural  effusion or pneumothorax. Vanessa visualized skeletal structures are unremarkable. IMPRESSION: No active disease. Electronically Signed   By: Aram Candela M.D.   On: 01/25/2023 21:57   CT Maxillofacial W Contrast  Result Date: 01/20/2023 CLINICAL DATA:  Initial evaluation for nasal abscess. EXAM: CT MAXILLOFACIAL WITH CONTRAST TECHNIQUE: Multidetector CT imaging of Vanessa maxillofacial structures was performed with intravenous contrast. Multiplanar CT image reconstructions were also generated. RADIATION DOSE REDUCTION: This exam was performed according to Vanessa departmental dose-optimization program which includes automated exposure control, adjustment of the mA and/or kV according to Short size and/or use of iterative reconstruction technique. CONTRAST:  75mL OMNIPAQUE IOHEXOL 350 MG/ML SOLN COMPARISON:  None Available. FINDINGS: Osseous: No acute osseous finding.  No discrete or worrisome osseous lesions. Orbits: Globes and orbital soft tissues within normal limits. Sinuses: Paranasal sinuses are largely clear. Trace right mastoid effusion, of doubtful significance. Soft tissues: Localized swelling seen at Vanessa anterior aspect of Vanessa nasal septum, just inferior to Vanessa nasal vestibule. Hypodense collection at this location measuring 10 x 8 x 8 mm, consistent with a small abscess (series 3, image 46). No other acute soft tissue abnormality about Vanessa face. Limited intracranial: Atrophy with chronic small vessel ischemic disease. Probable benign arachnoid cyst at Vanessa left middle cranial fossa. IMPRESSION: 10 x 8 x 8 mm abscess just inferior to Vanessa nasal vestibule. Electronically Signed   By: Rise Mu M.D.   On: 01/20/2023 21:08    Microbiology: Results for orders placed or performed during Vanessa hospital encounter of 01/31/23  Urine Culture     Status: Abnormal   Collection Time: 01/31/23  7:13 PM   Specimen: Urine, Clean Catch  Result Value Ref Range Status   Specimen Description URINE, CLEAN CATCH   Final   Special Requests NONE  Final   Culture (A)  Final    <10,000 COLONIES/mL INSIGNIFICANT GROWTH Performed at [facility], [address] [place], [place] [address]    Report Status 02/02/2023 FINAL  Final    Labs: CBC: Recent Labs  Lab 01/31/23 1131 01/31/23 1535 01/31/23 1652 02/01/23 0749  WBC 10.8* 8.0  --  7.2  NEUTROABS  --  5.7  --   --   HGB 12.3 12.7 13.3 11.6*  HCT 40.2 40.8 39.0 37.9  MCV 95.5 96.2  --  95.2  PLT 154 161  --  147*   Basic Metabolic Panel: Recent Labs  Lab 01/28/23 0408 01/31/23 1131 01/31/23 1652 02/01/23 0749 02/02/23 0315  NA 139 145 144 143 139  K 4.1 4.3 3.9 3.9 3.2*  CL 104 109  --  109 104  CO2 23 25  --  23 21*  GLUCOSE 114* 123*  --  120* 120*  BUN 34* 33*  --  32* 32*  CREATININE 1.04* 1.27*  --  1.19* 1.10*  CALCIUM 9.2 9.4  --  8.8* 8.2*  MG 2.2  --   --   --   --    Liver Function Tests: Recent Labs  Lab 01/31/23 1535 02/01/23 0749 02/02/23 0315  AST 64* 65* 35  ALT 83* 83* 63*  ALKPHOS 142* 126 107  BILITOT 0.6 0.3 0.5  PROT 6.3* 5.8* 4.9*  ALBUMIN 3.2* 2.9* 2.5*   CBG: Recent Labs  Lab 01/31/23 1156  GLUCAP 123*    Discharge time spent: greater than 30 minutes.  Signed: Thad Ranger, MD [facility] 02/03/2023

## 2023-02-04 LAB — 10-HYDROXYCARBAZEPINE: Triliptal/MTB(Oxcarbazepin): 24 ug/mL (ref 10–35)

## 2023-02-06 ENCOUNTER — Encounter: Payer: Self-pay | Admitting: Neurology

## 2023-02-12 ENCOUNTER — Encounter: Payer: Self-pay | Admitting: Neurology

## 2023-02-12 ENCOUNTER — Ambulatory Visit: Payer: Medicare Other | Admitting: Neurology

## 2023-02-12 VITALS — BP 139/85 | HR 83

## 2023-02-12 DIAGNOSIS — G8250 Quadriplegia, unspecified: Secondary | ICD-10-CM | POA: Diagnosis not present

## 2023-02-12 DIAGNOSIS — Z95828 Presence of other vascular implants and grafts: Secondary | ICD-10-CM

## 2023-02-12 DIAGNOSIS — G4719 Other hypersomnia: Secondary | ICD-10-CM

## 2023-02-12 DIAGNOSIS — R899 Unspecified abnormal finding in specimens from other organs, systems and tissues: Secondary | ICD-10-CM

## 2023-02-12 DIAGNOSIS — R440 Auditory hallucinations: Secondary | ICD-10-CM

## 2023-02-12 DIAGNOSIS — G35 Multiple sclerosis: Secondary | ICD-10-CM

## 2023-02-12 DIAGNOSIS — Z79899 Other long term (current) drug therapy: Secondary | ICD-10-CM | POA: Diagnosis not present

## 2023-02-12 DIAGNOSIS — G4733 Obstructive sleep apnea (adult) (pediatric): Secondary | ICD-10-CM

## 2023-02-12 MED ORDER — QUETIAPINE FUMARATE 25 MG PO TABS: ORAL_TABLET | ORAL | 11 refills | Status: AC

## 2023-02-12 MED ORDER — BACLOFEN 10 MG PO TABS: ORAL_TABLET | ORAL | 11 refills | Status: DC

## 2023-02-12 NOTE — Progress Notes (Signed)
[facility]  [name] DOB: [birth year]  REFERRING CLINICIAN: Carlos American, [facility] HISTORY FROM: patient    REASON FOR VISIT: MS   HISTORICAL  CHIEF COMPLAINT:  Chief Complaint  Patient presents with   Room 10    Pt is here with her Husband. Pt's husband states that pt had to go to the hospital 4 times within the last week.     HISTORY OF PRESENT ILLNESS:  Vanessa Short is a 57 y.o. woman diagnosed with relapsing remitting MS in 1989.   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   She had a CT showing no complex abscess.   A culture showed MRSA.   She went on ABx.   One day later, she started having hallucinations --- mostly seeing things flying or on the ceiling.  She was hearing voices (hearing her daughter not threatening or scary).   However, some of the visual allucinations were more scary.   She also had some episodes of phasing out.   She returned 3 more times to the ED over the past few weeks.   She had an EEG with one showing normal activity > 24 hours.   She received IV antibiotics  She was on doxycycline, metronidazole, Cefdinir  The infection is now cleared and off all antibiotics.     She was having hallucinations until 02/07/2023  She has been mostly bed bounds since she broke her right leg and left shoulder (an aide dropped her while transferring) 08/01/2023.  She still has a soft cast on the right leg.     She got the new power wheelchair but has not used it yet.     She is seeing orthopedics and sees them back next month.       She has had more cognitive issues and has had soe hallucinations recently.   She has had UTI and received 2 rounds of Abx.  She has incontinence.  Culture, ad 3 species so empiric therapy was prescribed.      She has a lot of spasticity in the legs.  Baclofen 20 mg po qid only helps some.   Additional cyclobenzaprine did not help   She has had long QT on some EKG  She snores and has witnessed OSA.    She is sleepy  EPWORTH SLEEPINESS SCALE  On a scale of 0 - 3 what is the chance of dozing:  Sitting and Reading:   3 Watching TV:    3 Sitting inactive in a public place: 1 Passenger in car for one hour: 3 Lying down to rest in the afternoon: 3 Sitting and talking to someone: 0 Sitting quietly after lunch:  2 In a car, stopped in traffic:  N/a  Total (out of 24):   15    MS: She was diagnosed with MS 1989.  Unfortunately, she has had progressive difficulties with weakness in both legs and in the left arm more than the right arm..  She has not had any recent exacerbation.  Due to her low lymphocyte count her Vumerity is currently being held.  Currently, strength is 0-1/5 in the legs and 2/5 in the left arm and 2+ to 4 -/5 in the right arm. She has urinary incontinence but has some voluntary control at times.   Vision is symmetric..  For spasticity has been a problem and she is  on Dantrium 50 mg 3 times a day and baclofen 20 mg 4 times a day.   She notes some fatigue.  She denies depression at this time.  Cognition is usually fine but has had issues when she has an infection.    She takes fluoxetine 60 mg daily.      MS History:  She presented with a Lhermite's syndrome in 1988 or 1989.   MRIs of the head and neck were performed. They showed lesions consistent with the diagnosis of multiple sclerosis. Lumbar puncture was not necessary. Over the next 15 years, she would get some exacerbations and have courses of steroids. However, a disease modifying therapy was not prescribed. By 2004, she had difficulty with her gait and would often have to lean on somebody for support.  She started Copaxone that year and stayed on it for about one half years. She stopped due to a lot of itching. She then switched to Rebif. While on Copaxone Rebif she continues to have exacerbations and further  difficulties with her gait. About 2012 she switched to Aubagio. She tolerated Aubagio well but progressed.  Tysabri was started in February 2019 after relapse.  Unfortunately she converted from JCV negative to positive.  She felt she did well on Tysabri with some slowing of her progression as well as improved fatigue.  Due to the elevated JCV, she switched to Vumerity in 2021.  MRI 10/11/2016 showed:  Confluent white matter disease throughout the cerebral hemispheres consistent with chronic multiple sclerosis. Several foci show restricted diffusion, but no contrast enhancement.  No new lesions compared to January 2018.  MRI cervical spine 08/16/2016 showed:  C5 myelomalacia, additional spinal cord plaques consistent with chronic demyelination, relatively similar though limited assessment due to patient motion.  MRI brain 08/16/2016 showed:  Numerous foci of reduced diffusion most consistent with hyperacute/active demyelination, less likely infection/septic emboli. Severe chronic demyelination including supra and infratentorial white matter, cortex and deep gray nuclei lesions.  Moderate parenchymal brain volume loss for age.  REVIEW OF SYSTEMS:  Constitutional: No fevers, chills, sweats, or change in appetite.  Has fatigue and poor slep Eyes: see above.   No eye pain Ear, nose and throat: No hearing loss, ear pain, nasal congestion, sore throat Cardiovascular: No chest pain, palpitations Respiratory:  No shortness of breath at rest or with exertion.   No wheezes GastrointestinaI: Has UC - doing well.   No nausea, vomiting, diarrhea, abdominal pain, fecal incontinence Genitourinary:  see above Musculoskeletal:  No neck pain, back pain Integumentary: No rash, pruritus, skin lesions Neurological: as above Psychiatric: as above. Endocrine: No palpitations, diaphoresis, change in appetite, change in weigh or increased thirst Hematologic/Lymphatic:  No anemia, purpura, petechiae. Allergic/Immunologic:  No itchy/runny eyes, nasal congestion, recent allergic reactions, rashes  ALLERGIES: Allergies  Allergen Reactions   Nitrofurantoin Other (See Comments)    syncope   Oxycodone-Acetaminophen Other (See Comments)    Other   Risperidone And Related     Became very lethargic   Penicillins Rash    Has patient had a PCN reaction causing immediate rash, facial/tongue/throat swelling, SOB or lightheadedness with hypotension: NO Has patient had a PCN reaction causing severe rash involving mucus membranes or skin necrosis: NO Has patient had a PCN reaction that required hospitalization NO Has patient had a PCN reaction occurring within the last 10 years:NO If all of the above answers are "NO", then may proceed with Cephalosporin use.    HOME MEDICATIONS: Outpatient Medications Prior to Visit  Medication Sig Dispense Refill   acyclovir (ZOVIRAX) 400 MG tablet Take 400 mg by mouth daily. Take every day per patient  0   aspirin EC 81 MG tablet Take 81 mg by mouth daily.     b complex vitamins tablet Take 1 tablet by mouth daily.      baclofen 15 MG TABS Take 15 mg by mouth 4 (four) times daily. (Patient taking differently: Take 20 mg by mouth 4 (four) times daily.) 120 each 1   cholecalciferol (VITAMIN D) 1000 UNITS tablet Take 1,000 Units by mouth daily.      famotidine (PEPCID) 20 MG tablet Take 40 mg by mouth 2 (two) times daily.     FLUoxetine (PROZAC) 20 MG capsule Take 20 mg by mouth 3 (three) times daily.     folic acid (FOLVITE) 400 MCG tablet Take 800 mcg by mouth daily.     furosemide (LASIX) 40 MG tablet Take 1 tablet (40 mg total) by mouth 2 (two) times daily. 30 tablet    labetalol (NORMODYNE) 100 MG tablet TAKE 1/2 TABLET BY MOUTH DAILY 45 tablet 2   lidocaine-prilocaine (EMLA) cream Apply one inch before port-a cath access prn (Patient taking differently: Apply 1 Application topically daily as needed (port access).) 30 g 3   lubiprostone (AMITIZA) 8 MCG capsule Take 8-16 mcg by  mouth 2 (two) times daily with a meal. Take  in the morning and 16 mcg in the afternoon.     mesalamine (APRISO) 0.375 g 24 hr capsule Take 375 mg by mouth QID.     Multiple Vitamin (MULTIVITAMIN) tablet Take 1 tablet by mouth daily.     mupirocin ointment (BACTROBAN) 2 % Apply 1 Application topically 2 (two) times daily. 22 g 0   OXcarbazepine (TRILEPTAL) 150 MG tablet Take 1 tablet (150 mg total) by mouth 3 (three) times daily. 270 tablet 3   potassium chloride (KLOR-CON) 10 MEQ tablet Take 20 mEq by mouth daily.     QUEtiapine (SEROQUEL) 25 MG tablet Take 1 tablet (25 mg total) by mouth at bedtime. 30 tablet 0   rosuvastatin (CRESTOR) 40 MG tablet Take 40 mg by mouth daily.     sodium chloride (OCEAN) 0.65 % SOLN nasal spray Place 1 spray into both nostrils as needed for congestion. (Patient taking differently: Place 1 spray into both nostrils daily as needed for congestion.)  0   Teriflunomide 14 MG TABS Take 1 tablet (14 mg total) by mouth daily. HOLD until cleared by neurology outpatient to resume 90 tablet 3   tolterodine (DETROL) 2 MG tablet Take 2 mg by mouth 2 (two) times daily.   1   dantrolene (DANTRIUM) 50 MG capsule Take 1 capsule (50 mg total) by mouth in the morning, at noon, in the evening, and at bedtime. (Patient taking differently: Take 50 mg by mouth 3 (three) times daily.) 360 capsule 3   diazepam (VALIUM) 5 MG tablet One po qHS (Patient not taking: Reported on 01/31/2023) 30 tablet 5   doxycycline (VIBRAMYCIN) 100 MG capsule Take 1 capsule (100 mg total) by mouth 2 (two) times daily. 20 capsule 0   ondansetron (ZOFRAN) 4 MG tablet Take 1 tablet (4 mg total) by mouth every 8 (eight) hours as needed for nausea. 30 tablet 0   No facility-administered medications prior to visit.    PAST MEDICAL HISTORY: Past Medical History:  Diagnosis Date   Headache    Herpes genitalis in women    History of kidney  stones    Hypertension    Kidney stones    Movement disorder     Multiple sclerosis (HCC)    Neuropathy    Oral herpes    Ulcerative colitis (HCC)    Vision abnormalities     PAST SURGICAL HISTORY: Past Surgical History:  Procedure Laterality Date   ENDOMETRIAL ABLATION  10/10/2017   KIDNEY STONE SURGERY     PORTACATH PLACEMENT N/A 10/16/2017   Procedure: ULTRA SOUND GUIDED INSERTION PORT-A-CATH ERAS PATHWAY;  Surgeon: Rodman Pickle, MD;  Location: [facility];  Service: General;  Laterality: N/A;    FAMILY HISTORY: Family History  Problem Relation Age of Onset   Dementia Mother    Hypertension Father    Hyperlipidemia Father    Diabetes Father    Heart disease Father    Breast cancer Neg Hx     SOCIAL HISTORY:  Social History   Socioeconomic History   Marital status: Married    Spouse name: Not on file   Number of children: Not on file   Years of education: Not on file   Highest education level: Not on file  Occupational History   Not on file  Tobacco Use   Smoking status: Never   Smokeless tobacco: Never  Vaping Use   Vaping Use: Never used  Substance and Sexual Activity   Alcohol use: Yes    Comment: occasional glass of wine   Drug use: No   Sexual activity: Not on file  Other Topics Concern   Not on file  Social History Narrative   ** Merged History Encounter **       Social Determinants of Health   Financial Resource Strain: Not on file  Food Insecurity: No Food Insecurity (01/26/2023)   Hunger Vital Sign    Worried About Running Out of Food in the Last Year: Never true    Ran Out of Food in the Last Year: Never true  Transportation Needs: No Transportation Needs (01/26/2023)   PRAPARE - Administrator, Civil Service (Medical): No    Lack of Transportation (Non-Medical): No  Physical Activity: Not on file  Stress: Not on file  Social Connections: Not on file  Intimate Partner Violence: Not At Risk (01/26/2023)   Humiliation, Afraid, Rape, and Kick questionnaire    Fear of Current or Ex-Partner:  No    Emotionally Abused: No    Physically Abused: No    Sexually Abused: No     PHYSICAL EXAM  Vitals:   02/12/23 1542  BP: 139/85  Pulse: 83    Weight = 175 pounds.   Patient is right handed.     There is no height or weight on file to calculate BMI.   General: The patient is well-developed and well-nourished and in no acute distress.  She is in a regular wheelchair with outstretched right leg that is splinted Skin/extremities: Extremities are without rash.  She has edema at the ankles  Neurologic Exam  Mental status: The patient is alert and oriented.  Focus and attention was reduced.  Speech is normal.  Cranial nerves: Extraocular movements are full.   Facial strength and sensation is normal.  Trapezius strength is normal.  There is no dysarthria.  No obvious hearing deficits are noted.  Motor:  Muscle bulk is normal .  Muscle tone is mildly increased in the legs, right greater than left.  She has increased muscle tone in the arms, left >>right   She has 0/5  strength in the legs.. Strength is 1 to 2-/5 in the left arm and 2+ to 3/5 in the right arm proximal arm and 4/5 finger and wrist flexion.  Able to touch face but without much strength.  She is unable to write.  She is able to operate her joystick on the right..   Sensory: She appears to have symmetric vibration sensation in the arms and reduced vibration sensation at the knees.  Gait and station: She can not stand or walk.    Reflexes: She has increased reflexes in her legs.    DIAGNOSTIC DATA (LABS, IMAGING, TESTING) - I reviewed patient records, labs, notes, testing and imaging myself where available.   Recent Results (from the past 2160 hour(s))  Hemoglobin A1C     Status: None   Collection Time: 11/29/22  1:48 PM  Result Value Ref Range   Hgb A1c MFr Bld 5.5 4.8 - 5.6 %    Comment:          Prediabetes: 5.7 - 6.4          Diabetes: >6.4          Glycemic control for adults with diabetes: <7.0    Est.  average glucose Bld gHb Est-mCnc 111 mg/dL  CMP     Status: Abnormal   Collection Time: 11/29/22  1:48 PM  Result Value Ref Range   Glucose 221 (H) 70 - 99 mg/dL   BUN 27 (H) 6 - 24 mg/dL   Creatinine, Ser 1.61 0.57 - 1.00 mg/dL   eGFR 65 >09 UE/AVW/0.98   BUN/Creatinine Ratio 27 (H) 9 - 23   Sodium 147 (H) 134 - 144 mmol/L   Potassium 3.1 (L) 3.5 - 5.2 mmol/L   Chloride 104 96 - 106 mmol/L   CO2 24 20 - 29 mmol/L   Calcium 9.6 8.7 - 10.2 mg/dL   Total Protein 6.2 6.0 - 8.5 g/dL   Albumin 4.0 3.8 - 4.9 g/dL   Globulin, Total 2.2 1.5 - 4.5 g/dL   Albumin/Globulin Ratio 1.8 1.2 - 2.2   Bilirubin Total 0.3 0.0 - 1.2 mg/dL   Alkaline Phosphatase 133 (H) 44 - 121 IU/L   AST 26 0 - 40 IU/L   ALT 34 (H) 0 - 32 IU/L  CBC with Differential/Platelet     Status: Abnormal   Collection Time: 11/29/22  1:48 PM  Result Value Ref Range   WBC 9.6 3.4 - 10.8 x10E3/uL   RBC 3.91 3.77 - 5.28 x10E6/uL   Hemoglobin 11.8 11.1 - 15.9 g/dL   Hematocrit 11.9 14.7 - 46.6 %   MCV 95 79 - 97 fL   MCH 30.2 26.6 - 33.0 pg   MCHC 31.8 31.5 - 35.7 g/dL   RDW 82.9 56.2 - 13.0 %   Platelets 159 150 - 450 x10E3/uL   Neutrophils 74 Not Estab. %   Lymphs 13 Not Estab. %   Monocytes 8 Not Estab. %   Eos 4 Not Estab. %   Basos 1 Not Estab. %   Neutrophils Absolute 7.1 (H) 1.4 - 7.0 x10E3/uL   Lymphocytes Absolute 1.3 0.7 - 3.1 x10E3/uL   Monocytes Absolute 0.8 0.1 - 0.9 x10E3/uL   EOS (ABSOLUTE) 0.4 0.0 - 0.4 x10E3/uL   Basophils Absolute 0.1 0.0 - 0.2 x10E3/uL   Immature Granulocytes 0 Not Estab. %   Immature Grans (Abs) 0.0 0.0 - 0.1 x10E3/uL  CMP     Status: Abnormal   Collection Time: 01/10/23 12:11 PM  Result Value Ref Range   Glucose 136 (H) 70 - 99 mg/dL   BUN 24 6 - 24 mg/dL   Creatinine, Ser 4.09 (H) 0.57 - 1.00 mg/dL   eGFR 60 >81 XB/JYN/8.29   BUN/Creatinine Ratio 22 9 - 23   Sodium 147 (H) 134 - 144 mmol/L   Potassium 3.2 (L) 3.5 - 5.2 mmol/L   Chloride 106 96 - 106 mmol/L   CO2 24 20 -  29 mmol/L   Calcium 9.2 8.7 - 10.2 mg/dL   Total Protein 6.3 6.0 - 8.5 g/dL   Albumin 4.0 3.8 - 4.9 g/dL   Globulin, Total 2.3 1.5 - 4.5 g/dL   Albumin/Globulin Ratio 1.7 1.2 - 2.2   Bilirubin Total 0.2 0.0 - 1.2 mg/dL   Alkaline Phosphatase 135 (H) 44 - 121 IU/L   AST 22 0 - 40 IU/L   ALT 33 (H) 0 - 32 IU/L  Lipid Panel     Status: None   Collection Time: 01/10/23 12:11 PM  Result Value Ref Range   Cholesterol, Total 120 100 - 199 mg/dL   Triglycerides 562 0 - 149 mg/dL   HDL 51 >13 mg/dL   VLDL Cholesterol Cal 21 5 - 40 mg/dL   LDL Chol Calc (NIH) 48 0 - 99 mg/dL   Chol/HDL Ratio 2.4 0.0 - 4.4 ratio    Comment:                                   T. Chol/HDL Ratio                                             Men  Women                               1/2 Avg.Risk  3.4    3.3                                   Avg.Risk  5.0    4.4                                2X Avg.Risk  9.6    7.1                                3X Avg.Risk 23.4   11.0   Hepatic Function Panel     Status: None   Collection Time: 01/10/23 12:11 PM  Result Value Ref Range   Bilirubin, Direct 0.11 0.00 - 0.40 mg/dL  CBC     Status: Abnormal   Collection Time: 01/20/23  4:23 PM  Result Value Ref Range   WBC 12.7 (H) 4.0 - 10.5 K/uL   RBC 3.83 (L) 3.87 - 5.11 MIL/uL   Hemoglobin 11.8 (L) 12.0 - 15.0 g/dL   HCT 08.6 57.8 - 46.9 %   MCV 96.1 80.0 - 100.0 fL   MCH 30.8 26.0 - 34.0 pg   MCHC 32.1 30.0 - 36.0 g/dL   RDW 62.9 52.8 - 41.3 %   Platelets 187 150 -  400 K/uL   nRBC 0.0 0.0 - 0.2 %    Comment: Performed at [facility], [address], [place], [place] [address]  Basic metabolic panel     Status: Abnormal   Collection Time: 01/20/23  4:23 PM  Result Value Ref Range   Sodium 138 135 - 145 mmol/L   Potassium 3.4 (L) 3.5 - 5.1 mmol/L   Chloride 102 98 - 111 mmol/L   CO2 22 22 - 32 mmol/L   Glucose, Bld 120 (H) 70 - 99 mg/dL    Comment: Glucose reference range applies only to samples taken after  fasting for at least 8 hours.   BUN 19 6 - 20 mg/dL   Creatinine, Ser 1.19 (H) 0.44 - 1.00 mg/dL   Calcium 9.0 8.9 - 14.7 mg/dL   GFR, Estimated 59 (L) >60 mL/min    Comment: (NOTE) Calculated using the CKD-EPI Creatinine Equation (2021)    Anion gap 14 5 - 15    Comment: Performed at [facility], [address] [place], [place] [address]  Lactic acid, plasma     Status: None   Collection Time: 01/20/23  4:23 PM  Result Value Ref Range   Lactic Acid, Venous 1.3 0.5 - 1.9 mmol/L    Comment: Performed at [facility], [address], [place], [place] [address]  Aerobic Culture w Gram Stain (superficial specimen)     Status: None   Collection Time: 01/20/23  5:45 PM   Specimen: Nose  Result Value Ref Range   Specimen Description NOSE    Special Requests NONE    Gram Stain      RARE SQUAMOUS EPITHELIAL CELLS PRESENT RARE WBC SEEN ABUNDANT GRAM POSITIVE COCCI RARE GRAM POSITIVE RODS Performed at [name], [address], [place], [place] [address]    Culture      MODERATE METHICILLIN RESISTANT STAPHYLOCOCCUS AUREUS   Report Status 01/22/2023 FINAL    Organism ID, Bacteria METHICILLIN RESISTANT STAPHYLOCOCCUS AUREUS       Susceptibility   Methicillin resistant staphylococcus aureus - MIC*    CIPROFLOXACIN >=8 RESISTANT Resistant     ERYTHROMYCIN >=8 RESISTANT Resistant     GENTAMICIN <=0.5 SENSITIVE Sensitive     OXACILLIN >=4 RESISTANT Resistant     TETRACYCLINE <=1 SENSITIVE Sensitive     VANCOMYCIN 1 SENSITIVE Sensitive     TRIMETH/SULFA <=10 SENSITIVE Sensitive     CLINDAMYCIN <=0.25 SENSITIVE Sensitive     RIFAMPIN <=0.5 SENSITIVE Sensitive     Inducible Clindamycin NEGATIVE Sensitive     LINEZOLID 4 SENSITIVE Sensitive     * MODERATE METHICILLIN RESISTANT STAPHYLOCOCCUS AUREUS  CBC with Differential     Status: Abnormal   Collection Time: 01/22/23  4:12 PM  Result Value Ref Range   WBC 10.3 4.0 - 10.5 K/uL   RBC 3.95 3.87 - 5.11 MIL/uL    Hemoglobin 11.5 (L) 12.0 - 15.0 g/dL   HCT 69.6 29.5 - 28.4 %   MCV 93.4 80.0 - 100.0 fL   MCH 29.1 26.0 - 34.0 pg   MCHC 31.2 30.0 - 36.0 g/dL   RDW 13.2 44.0 - 10.2 %   Platelets 206 150 - 400 K/uL   nRBC 0.0 0.0 - 0.2 %   Neutrophils Relative % 78 %   Neutro Abs 8.1 (H) 1.7 - 7.7 K/uL   Lymphocytes Relative 6 %   Lymphs Abs 0.6 (L) 0.7 - 4.0 K/uL   Monocytes Relative  12 %   Monocytes Absolute 1.2 (H) 0.1 - 1.0 K/uL   Eosinophils Relative 3 %   Eosinophils Absolute 0.3 0.0 - 0.5 K/uL   Basophils Relative 1 %   Basophils Absolute 0.1 0.0 - 0.1 K/uL   Immature Granulocytes 0 %   Abs Immature Granulocytes 0.04 0.00 - 0.07 K/uL    Comment: Performed at [facility], [address], [place], [place] [address]  Comprehensive metabolic panel     Status: Abnormal   Collection Time: 01/22/23  4:12 PM  Result Value Ref Range   Sodium 140 135 - 145 mmol/L   Potassium 3.0 (L) 3.5 - 5.1 mmol/L   Chloride 104 98 - 111 mmol/L   CO2 22 22 - 32 mmol/L   Glucose, Bld 155 (H) 70 - 99 mg/dL    Comment: Glucose reference range applies only to samples taken after fasting for at least 8 hours.   BUN 19 6 - 20 mg/dL   Creatinine, Ser 9.52 (H) 0.44 - 1.00 mg/dL   Calcium 9.0 8.9 - 84.1 mg/dL   Total Protein 6.4 (L) 6.5 - 8.1 g/dL   Albumin 3.1 (L) 3.5 - 5.0 g/dL   AST 25 15 - 41 U/L   ALT 35 0 - 44 U/L   Alkaline Phosphatase 110 38 - 126 U/L   Total Bilirubin 0.5 0.3 - 1.2 mg/dL   GFR, Estimated >32 >44 mL/min    Comment: (NOTE) Calculated using the CKD-EPI Creatinine Equation (2021)    Anion gap 14 5 - 15    Comment: Performed at [facility], [address], [place], [place] [address]  CBC with Differential     Status: Abnormal   Collection Time: 01/25/23 10:31 PM  Result Value Ref Range   WBC 12.0 (H) 4.0 - 10.5 K/uL   RBC 4.07 3.87 - 5.11 MIL/uL   Hemoglobin 12.2 12.0 - 15.0 g/dL   HCT 25.3 66.4 - 40.3 %   MCV 97.3 80.0 - 100.0 fL   MCH 30.0 26.0 - 34.0 pg   MCHC 30.8  30.0 - 36.0 g/dL   RDW 47.4 (H) 25.9 - 56.3 %   Platelets 217 150 - 400 K/uL   nRBC 0.0 0.0 - 0.2 %   Neutrophils Relative % 78 %   Neutro Abs 9.4 (H) 1.7 - 7.7 K/uL   Lymphocytes Relative 8 %   Lymphs Abs 1.0 0.7 - 4.0 K/uL   Monocytes Relative 10 %   Monocytes Absolute 1.2 (H) 0.1 - 1.0 K/uL   Eosinophils Relative 3 %   Eosinophils Absolute 0.3 0.0 - 0.5 K/uL   Basophils Relative 1 %   Basophils Absolute 0.1 0.0 - 0.1 K/uL   Immature Granulocytes 0 %   Abs Immature Granulocytes 0.04 0.00 - 0.07 K/uL    Comment: Performed at [facility], [address] [place], [place] [address]  Comprehensive metabolic panel     Status: Abnormal   Collection Time: 01/25/23 10:31 PM  Result Value Ref Range   Sodium 141 135 - 145 mmol/L   Potassium 3.7 3.5 - 5.1 mmol/L   Chloride 107 98 - 111 mmol/L   CO2 25 22 - 32 mmol/L   Glucose, Bld 123 (H) 70 - 99 mg/dL    Comment: Glucose reference range applies only to samples taken after fasting for at least 8 hours.   BUN 31 (H) 6 - 20 mg/dL   Creatinine, Ser 3.32 (H) 0.44 -  1.00 mg/dL   Calcium 8.8 (L) 8.9 - 10.3 mg/dL   Total Protein 6.7 6.5 - 8.1 g/dL   Albumin 3.3 (L) 3.5 - 5.0 g/dL   AST 29 15 - 41 U/L   ALT 40 0 - 44 U/L   Alkaline Phosphatase 107 38 - 126 U/L   Total Bilirubin 0.5 0.3 - 1.2 mg/dL   GFR, Estimated >14 >78 mL/min    Comment: (NOTE) Calculated using the CKD-EPI Creatinine Equation (2021)    Anion gap 9 5 - 15    Comment: Performed at [facility], [address], [place], [place] [address]  Blood culture (routine x 2)     Status: None   Collection Time: 01/25/23 10:31 PM   Specimen: BLOOD LEFT ARM  Result Value Ref Range   Specimen Description      BLOOD LEFT ARM Performed at [facility], [address], [place], [place] [address]    Special Requests      BOTTLES DRAWN AEROBIC AND ANAEROBIC Blood Culture adequate volume Performed at [facility], [address], [place], [place] [address]    Culture      NO GROWTH 5 DAYS Performed at [facility], [address], [place], [place] [address]    Report Status 01/31/2023 FINAL   Magnesium     Status: None   Collection Time: 01/25/23 10:31 PM  Result Value Ref Range   Magnesium 2.2 1.7 - 2.4 mg/dL    Comment: Performed at [facility], [address], [place], [place] [address]  Urinalysis, Routine w reflex microscopic -Urine, Clean Catch     Status: Abnormal   Collection Time: 01/26/23  3:43 AM  Result Value Ref Range   Color, Urine YELLOW YELLOW   APPearance CLEAR CLEAR   Specific Gravity, Urine 1.015 1.005 - 1.030   pH 6.0 5.0 - 8.0   Glucose, UA NEGATIVE NEGATIVE mg/dL   Hgb urine dipstick SMALL (A) NEGATIVE   Bilirubin Urine NEGATIVE NEGATIVE   Ketones, ur NEGATIVE NEGATIVE mg/dL   Protein, ur NEGATIVE NEGATIVE mg/dL   Nitrite NEGATIVE NEGATIVE   Leukocytes,Ua MODERATE (A) NEGATIVE    Comment: Performed at [facility], [address], [place], [place] [address]  Urinalysis, Microscopic (reflex)     Status: Abnormal   Collection Time: 01/26/23  3:43 AM  Result Value Ref Range   RBC / HPF 0-5 0 - 5 RBC/hpf   WBC, UA 0-5 0 - 5 WBC/hpf   Bacteria, UA RARE (A) NONE SEEN   Squamous Epithelial / HPF 0-5 0 - 5 /HPF    Comment: Performed at [facility], [address], [place], [place] [address]  CBC with Differential/Platelet     Status: Abnormal   Collection Time: 01/26/23  4:57 AM  Result Value Ref Range   WBC 10.9 (H) 4.0 - 10.5 K/uL   RBC 3.83 (L) 3.87 - 5.11 MIL/uL   Hemoglobin 11.3 (L) 12.0 - 15.0 g/dL   HCT 36.6 44.0 - 34.7 %   MCV 94.3 80.0 - 100.0 fL   MCH 29.5 26.0 - 34.0 pg   MCHC 31.3 30.0 - 36.0 g/dL   RDW 42.5 (H) 95.6 - 38.7 %   Platelets 205 150 - 400 K/uL   nRBC 0.0 0.0 - 0.2 %   Neutrophils Relative % 75 %   Neutro Abs 8.2 (H) 1.7 - 7.7 K/uL   Lymphocytes Relative 10 %  Lymphs Abs 1.1 0.7 - 4.0 K/uL   Monocytes  Relative 11 %   Monocytes Absolute 1.2 (H) 0.1 - 1.0 K/uL   Eosinophils Relative 3 %   Eosinophils Absolute 0.3 0.0 - 0.5 K/uL   Basophils Relative 1 %   Basophils Absolute 0.1 0.0 - 0.1 K/uL   Immature Granulocytes 0 %   Abs Immature Granulocytes 0.04 0.00 - 0.07 K/uL    Comment: Performed at [facility], [address], [place], [place] [address]  Comprehensive metabolic panel     Status: Abnormal   Collection Time: 01/26/23  4:57 AM  Result Value Ref Range   Sodium 140 135 - 145 mmol/L   Potassium 4.4 3.5 - 5.1 mmol/L    Comment: HEMOLYSIS AT THIS LEVEL MAY AFFECT RESULT   Chloride 106 98 - 111 mmol/L   CO2 23 22 - 32 mmol/L   Glucose, Bld 115 (H) 70 - 99 mg/dL    Comment: Glucose reference range applies only to samples taken after fasting for at least 8 hours.   BUN 28 (H) 6 - 20 mg/dL   Creatinine, Ser 1.91 (H) 0.44 - 1.00 mg/dL   Calcium 8.7 (L) 8.9 - 10.3 mg/dL   Total Protein 5.8 (L) 6.5 - 8.1 g/dL   Albumin 2.9 (L) 3.5 - 5.0 g/dL   AST 42 (H) 15 - 41 U/L    Comment: HEMOLYSIS AT THIS LEVEL MAY AFFECT RESULT   ALT 39 0 - 44 U/L    Comment: HEMOLYSIS AT THIS LEVEL MAY AFFECT RESULT   Alkaline Phosphatase 114 38 - 126 U/L   Total Bilirubin 0.9 0.3 - 1.2 mg/dL    Comment: HEMOLYSIS AT THIS LEVEL MAY AFFECT RESULT   GFR, Estimated >60 >60 mL/min    Comment: (NOTE) Calculated using the CKD-EPI Creatinine Equation (2021)    Anion gap 11 5 - 15    Comment: Performed at [facility], [address], [place], [place] [address]  Magnesium     Status: None   Collection Time: 01/26/23  4:57 AM  Result Value Ref Range   Magnesium 2.2 1.7 - 2.4 mg/dL    Comment: Performed at [facility], [address], [place], [place] [address]  Basic metabolic panel     Status: Abnormal   Collection Time: 01/27/23  3:47 AM  Result Value Ref Range   Sodium 137 135 - 145 mmol/L   Potassium 3.3 (L) 3.5 - 5.1 mmol/L   Chloride 102 98 - 111 mmol/L   CO2 23 22 - 32 mmol/L    Glucose, Bld 101 (H) 70 - 99 mg/dL    Comment: Glucose reference range applies only to samples taken after fasting for at least 8 hours.   BUN 33 (H) 6 - 20 mg/dL   Creatinine, Ser 0.86 (H) 0.44 - 1.00 mg/dL   Calcium 8.9 8.9 - 57.8 mg/dL   GFR, Estimated 57 (L) >60 mL/min    Comment: (NOTE) Calculated using the CKD-EPI Creatinine Equation (2021)    Anion gap 12 5 - 15    Comment: Performed at [facility], [address], [place], [place] [address]  CBC     Status: Abnormal   Collection Time: 01/27/23  3:47 AM  Result Value Ref Range   WBC 7.6 4.0 - 10.5 K/uL   RBC 3.69 (L) 3.87 - 5.11 MIL/uL   Hemoglobin 10.8 (L) 12.0 - 15.0 g/dL   HCT 95.2 (L) 84.1 -  46.0 %   MCV 94.0 80.0 - 100.0 fL   MCH 29.3 26.0 - 34.0 pg   MCHC 31.1 30.0 - 36.0 g/dL   RDW 16.1 (H) 09.6 - 04.5 %   Platelets 153 150 - 400 K/uL   nRBC 0.0 0.0 - 0.2 %    Comment: Performed at [facility], [address] [place], [place] [address]  Basic metabolic panel     Status: Abnormal   Collection Time: 01/28/23  4:08 AM  Result Value Ref Range   Sodium 139 135 - 145 mmol/L   Potassium 4.1 3.5 - 5.1 mmol/L   Chloride 104 98 - 111 mmol/L   CO2 23 22 - 32 mmol/L   Glucose, Bld 114 (H) 70 - 99 mg/dL    Comment: Glucose reference range applies only to samples taken after fasting for at least 8 hours.   BUN 34 (H) 6 - 20 mg/dL   Creatinine, Ser 1.91 (H) 0.44 - 1.00 mg/dL   Calcium 9.2 8.9 - 47.8 mg/dL   GFR, Estimated >29 >56 mL/min    Comment: (NOTE) Calculated using the CKD-EPI Creatinine Equation (2021)    Anion gap 12 5 - 15    Comment: Performed at [facility], [address], [place], [place] [address]  Magnesium     Status: None   Collection Time: 01/28/23  4:08 AM  Result Value Ref Range   Magnesium 2.2 1.7 - 2.4 mg/dL    Comment: Performed at [facility], [address], [place], [place] [address]  Basic metabolic panel     Status: Abnormal   Collection Time: 01/31/23 11:31 AM   Result Value Ref Range   Sodium 145 135 - 145 mmol/L   Potassium 4.3 3.5 - 5.1 mmol/L   Chloride 109 98 - 111 mmol/L   CO2 25 22 - 32 mmol/L   Glucose, Bld 123 (H) 70 - 99 mg/dL    Comment: Glucose reference range applies only to samples taken after fasting for at least 8 hours.   BUN 33 (H) 6 - 20 mg/dL   Creatinine, Ser 6.96 (H) 0.44 - 1.00 mg/dL   Calcium 9.4 8.9 - 29.5 mg/dL   GFR, Estimated 49 (L) >60 mL/min    Comment: (NOTE) Calculated using the CKD-EPI Creatinine Equation (2021)    Anion gap 11 5 - 15    Comment: Performed at [facility], [address], [place], [place] [address]  CBC     Status: Abnormal   Collection Time: 01/31/23 11:31 AM  Result Value Ref Range   WBC 10.8 (H) 4.0 - 10.5 K/uL   RBC 4.21 3.87 - 5.11 MIL/uL   Hemoglobin 12.3 12.0 - 15.0 g/dL   HCT 24.4 01.0 - 27.2 %   MCV 95.5 80.0 - 100.0 fL   MCH 29.2 26.0 - 34.0 pg   MCHC 30.6 30.0 - 36.0 g/dL   RDW 53.6 (H) 64.4 - 03.4 %   Platelets 154 150 - 400 K/uL   nRBC 0.0 0.0 - 0.2 %    Comment: Performed at [facility], [address], [place], [place] [address]  CBG monitoring, ED     Status: Abnormal   Collection Time: 01/31/23 11:56 AM  Result Value Ref Range   Glucose-Capillary 123 (H) 70 - 99 mg/dL    Comment: Glucose reference range applies only to samples taken after fasting for at least 8 hours.  I-Stat beta hCG  blood, ED     Status: Abnormal   Collection Time: 01/31/23  1:54 PM  Result Value Ref Range   I-stat hCG, quantitative 13.5 (H) <5 mIU/mL   Comment 3            Comment:   GEST. AGE      CONC.  (mIU/mL)   <=1 WEEK        5 - 50     2 WEEKS       50 - 500     3 WEEKS       100 - 10,000     4 WEEKS     1,000 - 30,000        FEMALE AND NON-PREGNANT FEMALE:     LESS THAN 5 mIU/mL   Differential     Status: Abnormal   Collection Time: 01/31/23  3:35 PM  Result Value Ref Range   Neutrophils Relative % 71 %   Neutro Abs 5.7 1.7 - 7.7 K/uL   Lymphocytes Relative 11 %    Lymphs Abs 0.8 0.7 - 4.0 K/uL   Monocytes Relative 15 %   Monocytes Absolute 1.2 (H) 0.1 - 1.0 K/uL   Eosinophils Relative 2 %   Eosinophils Absolute 0.1 0.0 - 0.5 K/uL   Basophils Relative 1 %   Basophils Absolute 0.1 0.0 - 0.1 K/uL   Immature Granulocytes 0 %   Abs Immature Granulocytes 0.02 0.00 - 0.07 K/uL    Comment: Performed at [facility], [address] [place], [place] [address]  Hepatic function panel     Status: Abnormal   Collection Time: 01/31/23  3:35 PM  Result Value Ref Range   Total Protein 6.3 (L) 6.5 - 8.1 g/dL   Albumin 3.2 (L) 3.5 - 5.0 g/dL   AST 64 (H) 15 - 41 U/L   ALT 83 (H) 0 - 44 U/L   Alkaline Phosphatase 142 (H) 38 - 126 U/L   Total Bilirubin 0.6 0.3 - 1.2 mg/dL   Bilirubin, Direct 0.1 0.0 - 0.2 mg/dL   Indirect Bilirubin 0.5 0.3 - 0.9 mg/dL    Comment: Performed at [facility], [address] [place], [place] [address]  TSH     Status: None   Collection Time: 01/31/23  3:35 PM  Result Value Ref Range   TSH 3.649 0.350 - 4.500 uIU/mL    Comment: Performed by a 3rd Generation assay with a functional sensitivity of <=0.01 uIU/mL. Performed at [name], [address], [place], [place] [address]   T4, free     Status: None   Collection Time: 01/31/23  3:35 PM  Result Value Ref Range   Free T4 0.75 0.61 - 1.12 ng/dL    Comment: (NOTE) Biotin ingestion may interfere with free T4 tests. If the results are inconsistent with the TSH level, previous test results, or the clinical presentation, then consider biotin interference. If needed, order repeat testing after stopping biotin. Performed at [facility], [address] [place], [place] [address]   CBC     Status: Abnormal   Collection Time: 01/31/23  3:35 PM  Result Value Ref Range   WBC 8.0 4.0 - 10.5 K/uL   RBC 4.24 3.87 - 5.11 MIL/uL   Hemoglobin 12.7 12.0 - 15.0 g/dL   HCT 40.1 02.7 - 25.3 %   MCV 96.2 80.0 - 100.0 fL   MCH 30.0 26.0 - 34.0 pg   MCHC 31.1  30.0 - 36.0 g/dL   RDW 40.9 (H) 81.1 - 91.4 %   Platelets 161 150 - 400 K/uL   nRBC 0.0 0.0 - 0.2 %    Comment: Performed at [facility], [address], [place], [place] [address]  Ammonia     Status: None   Collection Time: 01/31/23  4:22 PM  Result Value Ref Range   Ammonia <10 9 - 35 umol/L    Comment: Performed at [facility], [address], [place], [place] [address]  Urinalysis, Routine w reflex microscopic -Urine, Clean Catch     Status: Abnormal   Collection Time: 01/31/23  4:51 PM  Result Value Ref Range   Color, Urine YELLOW YELLOW   APPearance CLOUDY (A) CLEAR   Specific Gravity, Urine 1.010 1.005 - 1.030   pH 5.0 5.0 - 8.0   Glucose, UA NEGATIVE NEGATIVE mg/dL   Hgb urine dipstick NEGATIVE NEGATIVE   Bilirubin Urine NEGATIVE NEGATIVE   Ketones, ur NEGATIVE NEGATIVE mg/dL   Protein, ur NEGATIVE NEGATIVE mg/dL   Nitrite NEGATIVE NEGATIVE   Leukocytes,Ua SMALL (A) NEGATIVE   RBC / HPF 0-5 0 - 5 RBC/hpf   WBC, UA 11-20 0 - 5 WBC/hpf   Bacteria, UA RARE (A) NONE SEEN   Squamous Epithelial / HPF 11-20 0 - 5 /HPF   Mucus PRESENT    Hyaline Casts, UA PRESENT     Comment: Performed at [facility], [address] [place], [place] [address]  I-Stat venous blood gas, [facility] ED, [facility], [facility])     Status: Abnormal   Collection Time: 01/31/23  4:52 PM  Result Value Ref Range   pH, Ven 7.339 7.25 - 7.43   pCO2, Ven 50.5 44 - 60 mmHg   pO2, Ven 104 (H) 32 - 45 mmHg   Bicarbonate 27.2 20.0 - 28.0 mmol/L   TCO2 29 22 - 32 mmol/L   O2 Saturation 98 %   Acid-Base Excess 1.0 0.0 - 2.0 mmol/L   Sodium 144 135 - 145 mmol/L   Potassium 3.9 3.5 - 5.1 mmol/L   Calcium, Ion 1.01 (L) 1.15 - 1.40 mmol/L   HCT 39.0 36.0 - 46.0 %   Hemoglobin 13.3 12.0 - 15.0 g/dL   Sample type VENOUS   Urine Culture     Status: Abnormal   Collection Time: 01/31/23  7:13 PM   Specimen: Urine, Clean Catch  Result Value Ref Range   Specimen Description URINE, CLEAN CATCH    Special  Requests NONE    Culture (A)     <10,000 COLONIES/mL INSIGNIFICANT GROWTH Performed at [facility], [address] [place], [place] [address]    Report Status 02/02/2023 FINAL   10-Hydroxycarbazepine     Status: None   Collection Time: 01/31/23 10:17 PM  Result Value Ref Range   Triliptal/MTB(Oxcarbazepin) 24 10 - 35 ug/mL    Comment: (NOTE) This test was developed and its performance characteristics determined by [facility]. It has not been cleared or approved by the Food and Drug Administration.                                Detection Limit = 1 Performed At: [facility] [address] [place], [place] [id number] Jolene Schimke MD [id number]   HIV Antibody (routine testing w rflx)     Status: None   Collection Time: 01/31/23 10:17 PM  Result  Value Ref Range   HIV Screen 4th Generation wRfx Non Reactive Non Reactive    Comment: Performed at [facility], [address] [place], [place] [address]  CBC     Status: Abnormal   Collection Time: 02/01/23  7:49 AM  Result Value Ref Range   WBC 7.2 4.0 - 10.5 K/uL   RBC 3.98 3.87 - 5.11 MIL/uL   Hemoglobin 11.6 (L) 12.0 - 15.0 g/dL   HCT 60.4 54.0 - 98.1 %   MCV 95.2 80.0 - 100.0 fL   MCH 29.1 26.0 - 34.0 pg   MCHC 30.6 30.0 - 36.0 g/dL   RDW 19.1 (H) 47.8 - 29.5 %   Platelets 147 (L) 150 - 400 K/uL   nRBC 0.0 0.0 - 0.2 %    Comment: Performed at [facility], [address] [place], [place] [address]  Comprehensive metabolic panel     Status: Abnormal   Collection Time: 02/01/23  7:49 AM  Result Value Ref Range   Sodium 143 135 - 145 mmol/L   Potassium 3.9 3.5 - 5.1 mmol/L   Chloride 109 98 - 111 mmol/L   CO2 23 22 - 32 mmol/L   Glucose, Bld 120 (H) 70 - 99 mg/dL    Comment: Glucose reference range applies only to samples taken after fasting for at least 8 hours.   BUN 32 (H) 6 - 20 mg/dL   Creatinine, Ser 8.65 (H) 0.44 - 1.00 mg/dL   Calcium 8.8 (L) 8.9 - 10.3 mg/dL   Total Protein 5.8 (L)  6.5 - 8.1 g/dL   Albumin 2.9 (L) 3.5 - 5.0 g/dL   AST 65 (H) 15 - 41 U/L   ALT 83 (H) 0 - 44 U/L   Alkaline Phosphatase 126 38 - 126 U/L   Total Bilirubin 0.3 0.3 - 1.2 mg/dL   GFR, Estimated 53 (L) >60 mL/min    Comment: (NOTE) Calculated using the CKD-EPI Creatinine Equation (2021)    Anion gap 11 5 - 15    Comment: Performed at [facility], [address], [place], [place] [address]  CK     Status: Abnormal   Collection Time: 02/01/23  2:00 PM  Result Value Ref Range   Total CK 13 (L) 38 - 234 U/L    Comment: Performed at [facility], [address], [place], [place] [address]  Aldolase     Status: None   Collection Time: 02/01/23  2:00 PM  Result Value Ref Range   Aldolase 4.8 3.3 - 10.3 U/L    Comment: (NOTE) Performed At: [facility] [address] [place], [place] [id number] Jolene Schimke MD [id number]   Gamma GT     Status: Abnormal   Collection Time: 02/01/23  2:00 PM  Result Value Ref Range   GGT 119 (H) 7 - 50 U/L    Comment: Performed at [facility], [address], [place], [place] [address]  Lactate dehydrogenase     Status: None   Collection Time: 02/01/23  2:00 PM  Result Value Ref Range   LDH 141 98 - 192 U/L    Comment: Performed at [facility], [address], [place], [place] [address]  Comprehensive metabolic panel     Status: Abnormal   Collection Time: 02/02/23  3:15 AM  Result Value Ref Range   Sodium 139 135 - 145 mmol/L   Potassium 3.2 (L) 3.5 - 5.1 mmol/L  Chloride 104 98 - 111 mmol/L   CO2 21 (L) 22 - 32 mmol/L   Glucose, Bld 120 (H) 70 - 99 mg/dL    Comment: Glucose reference range applies only to samples taken after fasting for at least 8 hours.   BUN 32 (H) 6 - 20 mg/dL   Creatinine, Ser 1.61 (H) 0.44 - 1.00 mg/dL   Calcium 8.2 (L) 8.9 - 10.3 mg/dL   Total Protein 4.9 (L) 6.5 - 8.1 g/dL   Albumin 2.5 (L) 3.5 - 5.0 g/dL   AST 35 15 - 41 U/L   ALT 63 (H) 0 - 44 U/L   Alkaline Phosphatase 107 38  - 126 U/L   Total Bilirubin 0.5 0.3 - 1.2 mg/dL   GFR, Estimated 58 (L) >60 mL/min    Comment: (NOTE) Calculated using the CKD-EPI Creatinine Equation (2021)    Anion gap 14 5 - 15    Comment: Performed at [facility], [address] [place], [place] [address]      ASSESSMENT AND PLAN  Multiple sclerosis (HCC)  High risk medication use  Spastic quadriparesis (HCC)  Port-A-Cath in place  Auditory hallucinations  Abnormal laboratory test   She had hallucinations associated with infection that  cleared after antibiotics.   In general, when she has an infection, mental status has returned to baseline more rapidly with IV options. Her lymphocyte count has remained low so I have kept her off of a disease modifying therapy.   The patient comes back to have her port flush later this week and we will check the CBC with differential and determine if we can restart a disease modifying therapy.   Port will be flushed about every 4-6 weeks.  4.   Home sleep study.   If CSA and OSA both seen, may need to do in-lab titration. 5.   rtc 6 months, sooner if ne or worsening neurologic issues  48-minute office visit with the majority of the time spent face-to-face for history and physical, discussion/counseling and decision-making.  Additional time with record review and documentation.  This visit is part of a comprehensive longitudinal care medical relationship regarding the patients primary diagnosis of MS and related concerns.  Avyan Livesay A. Epimenio Foot, MD, PhD 02/12/2023, 3:49 PM Certified in Neurology, Clinical Neurophysiology, Sleep Medicine, Pain Medicine and Neuroimaging  [facility] [address], [address] [place], [place] [address] [phone]  Addendum 09/08/20:  Fixed error in history - correct statement should be she switched from Tysabri to Vumerity. -- RAS

## 2023-02-20 ENCOUNTER — Encounter: Payer: Self-pay | Admitting: Neurology

## 2023-02-21 NOTE — Telephone Encounter (Signed)
Noted, I already spoke with the patient husband Tresa Endo and he is coming by to pick up her HST device that morning and wanted to keep the 9 AM time slot.

## 2023-03-06 ENCOUNTER — Ambulatory Visit: Payer: Medicare Other | Admitting: Neurology

## 2023-03-06 DIAGNOSIS — G4719 Other hypersomnia: Secondary | ICD-10-CM

## 2023-03-06 DIAGNOSIS — G4733 Obstructive sleep apnea (adult) (pediatric): Secondary | ICD-10-CM

## 2023-03-06 DIAGNOSIS — R063 Periodic breathing: Secondary | ICD-10-CM

## 2023-03-07 ENCOUNTER — Ambulatory Visit
Admission: RE | Admit: 2023-03-07 | Discharge: 2023-03-07 | Disposition: A | Discharge status: Home / Self Care | Payer: Medicare Other | Source: Ambulatory Visit | Attending: Family Medicine | Admitting: Family Medicine

## 2023-03-07 ENCOUNTER — Telehealth: Payer: Self-pay | Admitting: Neurology

## 2023-03-07 DIAGNOSIS — Z1231 Encounter for screening mammogram for malignant neoplasm of breast: Secondary | ICD-10-CM

## 2023-03-07 NOTE — Telephone Encounter (Signed)
LVM and sent mychart msg informing pt of need to reschedule 04/26/23 appt - MD out

## 2023-03-08 ENCOUNTER — Telehealth: Payer: Self-pay | Admitting: Neurology

## 2023-03-08 DIAGNOSIS — G4733 Obstructive sleep apnea (adult) (pediatric): Secondary | ICD-10-CM

## 2023-03-08 DIAGNOSIS — G35 Multiple sclerosis: Secondary | ICD-10-CM

## 2023-03-08 DIAGNOSIS — G4731 Primary central sleep apnea: Secondary | ICD-10-CM

## 2023-03-08 DIAGNOSIS — G8250 Quadriplegia, unspecified: Secondary | ICD-10-CM

## 2023-03-08 NOTE — Telephone Encounter (Signed)
The home sleep study showed severe sleep apnea with much of the night and Cheyne-Stokes respiration.  Due to the combination of obstructive and central sleep apnea and neurologic dysfunction, there is a high likelihood that she will need advanced BiPAP mode such as ST or ASV.  Therefore, will be to do an in lab titration.  She is wheelchair-bound but her husband can come with her for the study.  He has a Lexicographer and she can be hooked up to be transferred to the bed and the pure wick can be set up beneath her.  He would like to come in to test the Lillie lift before she comes in for the study to make sure that it will fit under the bed.  There appears to be 5 to 6 inches which he believes will be enough.            [facility]   HOME SLEEP STUDY   STUDY DATE: 03/06/2023 PATIENT NAME: Vanessa Short DOB: Jan 30, 1966 MRN: [id number]   ORDERING CLINICIAN: Azriel Dancy A. Epimenio Foot, MD, PhD INTERPRETING CLINICIAN: Samarah Hogle A. Epimenio Foot, MD. PhD     CLINICAL INFORMATION: 57 year old woman with witnessed apnea and excessive daytime sleepiness.  She also has multiple sclerosis and is wheelchair-bound.     IMPRESSION:  Severe sleep apnea with pAHI 3% of 82.7/h..  The central pAHI was 40/h Cheyne-Stokes respirations were noted during 61% of sleep. Nocturnal hypoxemia with 41 minutes of sleep below SaO2 of 88% and 2 minutes below SaO2 of 85% 98% sleep efficiency.         RECOMMENDATION: Due to the severity of the sleep apnea, presence of central apneas with Cheyne-Stokes respiration and neurologic dysfunction, an attended PAP titration study is recommended.  She may need BiPAP with advanced ST or ASV modes If hypoxemia does not improve with treatment of sleep apnea, consider nocturnal oxygen  follow-up with Dr. Epimenio Foot.

## 2023-03-14 ENCOUNTER — Encounter: Payer: Self-pay | Admitting: Neurology

## 2023-03-14 DIAGNOSIS — Z79899 Other long term (current) drug therapy: Secondary | ICD-10-CM

## 2023-03-14 DIAGNOSIS — E785 Hyperlipidemia, unspecified: Secondary | ICD-10-CM

## 2023-03-14 DIAGNOSIS — G35 Multiple sclerosis: Secondary | ICD-10-CM

## 2023-03-14 NOTE — Telephone Encounter (Signed)
Noted, thank you

## 2023-03-14 NOTE — Telephone Encounter (Signed)
Contacted the pt to rescheduled 6 month f/u with Dr. Epimenio Foot on 05/15/23. Pt said she has to come to the office for a port flush 03/15/23. Asking if can speak with Irving Burton to check the bed for the hoyer lift before sleep study.

## 2023-03-15 ENCOUNTER — Other Ambulatory Visit: Payer: Self-pay

## 2023-03-15 DIAGNOSIS — E785 Hyperlipidemia, unspecified: Secondary | ICD-10-CM

## 2023-03-15 DIAGNOSIS — G35D Multiple sclerosis, unspecified: Secondary | ICD-10-CM

## 2023-03-15 DIAGNOSIS — G35 Multiple sclerosis: Secondary | ICD-10-CM

## 2023-03-15 DIAGNOSIS — Z79899 Other long term (current) drug therapy: Secondary | ICD-10-CM

## 2023-03-15 NOTE — Telephone Encounter (Signed)
Pt seen in office 02-12-2023. From OV states check CBC.  I started order with cbc d/p, CMP, lipid.

## 2023-03-16 ENCOUNTER — Encounter: Payer: Self-pay | Admitting: Neurology

## 2023-03-20 ENCOUNTER — Telehealth: Payer: Self-pay | Admitting: Neurology

## 2023-03-20 NOTE — Telephone Encounter (Signed)
BiPAP-no auth req'd (Husbandf to stay in room 1 per [facility])   Patient is scheduled at [facility] for 04/08/23 at 8 pm.  Mailed packet to the patient.

## 2023-04-09 NOTE — Telephone Encounter (Signed)
Patient called in on Saturday 04/07/2023 morning to cancel her sleep study appointment.

## 2023-04-09 NOTE — Telephone Encounter (Signed)
Patient husband Vanessa Short called to r/s her appt she is r/s for 06/19/23 at 9 pm.  I did inform him I would put her on the cancellation list.

## 2023-04-17 ENCOUNTER — Emergency Department (HOSPITAL_COMMUNITY): Payer: Medicare Other

## 2023-04-17 ENCOUNTER — Inpatient Hospital Stay (HOSPITAL_COMMUNITY)
Admission: EM | Admit: 2023-04-17 | Discharge: 2023-04-21 | DRG: 682 | Disposition: A | Discharge status: Home / Self Care | Payer: Medicare Other | Attending: Internal Medicine | Admitting: Internal Medicine

## 2023-04-17 ENCOUNTER — Other Ambulatory Visit: Payer: Self-pay

## 2023-04-17 DIAGNOSIS — E860 Dehydration: Secondary | ICD-10-CM | POA: Diagnosis present

## 2023-04-17 DIAGNOSIS — K5190 Ulcerative colitis, unspecified, without complications: Secondary | ICD-10-CM | POA: Diagnosis present

## 2023-04-17 DIAGNOSIS — Z8616 Personal history of COVID-19: Secondary | ICD-10-CM

## 2023-04-17 DIAGNOSIS — G629 Polyneuropathy, unspecified: Secondary | ICD-10-CM | POA: Diagnosis present

## 2023-04-17 DIAGNOSIS — Z820 Family history of epilepsy and other diseases of the nervous system: Secondary | ICD-10-CM

## 2023-04-17 DIAGNOSIS — R748 Abnormal levels of other serum enzymes: Secondary | ICD-10-CM | POA: Diagnosis present

## 2023-04-17 DIAGNOSIS — G9340 Encephalopathy, unspecified: Secondary | ICD-10-CM | POA: Diagnosis present

## 2023-04-17 DIAGNOSIS — Z993 Dependence on wheelchair: Secondary | ICD-10-CM

## 2023-04-17 DIAGNOSIS — G4733 Obstructive sleep apnea (adult) (pediatric): Secondary | ICD-10-CM | POA: Diagnosis present

## 2023-04-17 DIAGNOSIS — Z881 Allergy status to other antibiotic agents status: Secondary | ICD-10-CM

## 2023-04-17 DIAGNOSIS — E876 Hypokalemia: Secondary | ICD-10-CM | POA: Diagnosis present

## 2023-04-17 DIAGNOSIS — N179 Acute kidney failure, unspecified: Secondary | ICD-10-CM | POA: Diagnosis not present

## 2023-04-17 DIAGNOSIS — G35 Multiple sclerosis: Secondary | ICD-10-CM | POA: Diagnosis present

## 2023-04-17 DIAGNOSIS — F418 Other specified anxiety disorders: Secondary | ICD-10-CM | POA: Diagnosis present

## 2023-04-17 DIAGNOSIS — N189 Chronic kidney disease, unspecified: Secondary | ICD-10-CM | POA: Diagnosis not present

## 2023-04-17 DIAGNOSIS — E785 Hyperlipidemia, unspecified: Secondary | ICD-10-CM | POA: Diagnosis present

## 2023-04-17 DIAGNOSIS — D631 Anemia in chronic kidney disease: Secondary | ICD-10-CM | POA: Diagnosis present

## 2023-04-17 DIAGNOSIS — Z888 Allergy status to other drugs, medicaments and biological substances status: Secondary | ICD-10-CM

## 2023-04-17 DIAGNOSIS — D649 Anemia, unspecified: Secondary | ICD-10-CM | POA: Diagnosis present

## 2023-04-17 DIAGNOSIS — I129 Hypertensive chronic kidney disease with stage 1 through stage 4 chronic kidney disease, or unspecified chronic kidney disease: Secondary | ICD-10-CM | POA: Diagnosis present

## 2023-04-17 DIAGNOSIS — N1831 Chronic kidney disease, stage 3a: Secondary | ICD-10-CM | POA: Diagnosis present

## 2023-04-17 DIAGNOSIS — F32A Depression, unspecified: Secondary | ICD-10-CM | POA: Diagnosis present

## 2023-04-17 DIAGNOSIS — Z8249 Family history of ischemic heart disease and other diseases of the circulatory system: Secondary | ICD-10-CM

## 2023-04-17 DIAGNOSIS — F419 Anxiety disorder, unspecified: Secondary | ICD-10-CM | POA: Diagnosis present

## 2023-04-17 DIAGNOSIS — G928 Other toxic encephalopathy: Secondary | ICD-10-CM | POA: Diagnosis present

## 2023-04-17 DIAGNOSIS — Z79899 Other long term (current) drug therapy: Secondary | ICD-10-CM

## 2023-04-17 DIAGNOSIS — Z880 Allergy status to penicillin: Secondary | ICD-10-CM

## 2023-04-17 DIAGNOSIS — G4731 Primary central sleep apnea: Secondary | ICD-10-CM | POA: Diagnosis present

## 2023-04-17 DIAGNOSIS — I10 Essential (primary) hypertension: Secondary | ICD-10-CM | POA: Diagnosis present

## 2023-04-17 DIAGNOSIS — G8250 Quadriplegia, unspecified: Secondary | ICD-10-CM | POA: Diagnosis present

## 2023-04-17 DIAGNOSIS — Z7982 Long term (current) use of aspirin: Secondary | ICD-10-CM

## 2023-04-17 DIAGNOSIS — Z885 Allergy status to narcotic agent status: Secondary | ICD-10-CM

## 2023-04-17 DIAGNOSIS — Z833 Family history of diabetes mellitus: Secondary | ICD-10-CM

## 2023-04-17 DIAGNOSIS — Z83438 Family history of other disorder of lipoprotein metabolism and other lipidemia: Secondary | ICD-10-CM

## 2023-04-17 LAB — CBC WITH DIFFERENTIAL/PLATELET
Abs Immature Granulocytes: 0.12 10*3/uL — ABNORMAL HIGH (ref 0.00–0.07)
Basophils Absolute: 0.1 10*3/uL (ref 0.0–0.1)
Basophils Relative: 1 %
Eosinophils Absolute: 0.3 10*3/uL (ref 0.0–0.5)
Eosinophils Relative: 3 %
HCT: 31.7 % — ABNORMAL LOW (ref 36.0–46.0)
Hemoglobin: 9.6 g/dL — ABNORMAL LOW (ref 12.0–15.0)
Immature Granulocytes: 1 %
Lymphocytes Relative: 7 %
Lymphs Abs: 0.8 10*3/uL (ref 0.7–4.0)
MCH: 28.1 pg (ref 26.0–34.0)
MCHC: 30.3 g/dL (ref 30.0–36.0)
MCV: 92.7 fL (ref 80.0–100.0)
Monocytes Absolute: 1.8 10*3/uL — ABNORMAL HIGH (ref 0.1–1.0)
Monocytes Relative: 16 %
Neutro Abs: 8.2 10*3/uL — ABNORMAL HIGH (ref 1.7–7.7)
Neutrophils Relative %: 72 %
Platelets: 263 10*3/uL (ref 150–400)
RBC: 3.42 MIL/uL — ABNORMAL LOW (ref 3.87–5.11)
RDW: 15.4 % (ref 11.5–15.5)
WBC: 11.3 10*3/uL — ABNORMAL HIGH (ref 4.0–10.5)
nRBC: 0 % (ref 0.0–0.2)

## 2023-04-17 LAB — COMPREHENSIVE METABOLIC PANEL
ALT: 51 U/L — ABNORMAL HIGH (ref 0–44)
AST: 30 U/L (ref 15–41)
Albumin: 2.9 g/dL — ABNORMAL LOW (ref 3.5–5.0)
Alkaline Phosphatase: 173 U/L — ABNORMAL HIGH (ref 38–126)
Anion gap: 17 — ABNORMAL HIGH (ref 5–15)
BUN: 38 mg/dL — ABNORMAL HIGH (ref 6–20)
CO2: 22 mmol/L (ref 22–32)
Calcium: 9.2 mg/dL (ref 8.9–10.3)
Chloride: 104 mmol/L (ref 98–111)
Creatinine, Ser: 1.76 mg/dL — ABNORMAL HIGH (ref 0.44–1.00)
GFR, Estimated: 33 mL/min — ABNORMAL LOW (ref >60)
Glucose, Bld: 140 mg/dL — ABNORMAL HIGH (ref 70–99)
Potassium: 3.2 mmol/L — ABNORMAL LOW (ref 3.5–5.1)
Sodium: 143 mmol/L (ref 135–145)
Total Bilirubin: 0.7 mg/dL (ref 0.3–1.2)
Total Protein: 6.7 g/dL (ref 6.5–8.1)

## 2023-04-17 LAB — URINALYSIS, W/ REFLEX TO CULTURE (INFECTION SUSPECTED)
Bilirubin Urine: NEGATIVE
Glucose, UA: NEGATIVE mg/dL
Ketones, ur: NEGATIVE mg/dL
Nitrite: NEGATIVE
Protein, ur: 30 mg/dL — AB
RBC / HPF: >50 RBC/hpf (ref 0–5)
Specific Gravity, Urine: 1.011 (ref 1.005–1.030)
WBC, UA: >50 WBC/hpf (ref 0–5)
pH: 5 (ref 5.0–8.0)

## 2023-04-17 LAB — I-STAT CG4 LACTIC ACID, ED
Lactic Acid, Venous: 1.6 mmol/L (ref 0.5–1.9)
Lactic Acid, Venous: 1.1 mmol/L (ref 0.5–1.9)

## 2023-04-17 LAB — HCG, SERUM, QUALITATIVE: Preg, Serum: NEGATIVE

## 2023-04-17 MED ORDER — SODIUM CHLORIDE 0.9 % IV SOLN
1.0000 g | Freq: Once | INTRAVENOUS | Status: AC
  Administered 2023-04-17: 1 g via INTRAVENOUS
  Filled 2023-04-17: qty 10

## 2023-04-17 MED ORDER — ACETAMINOPHEN 650 MG RE SUPP: 650.0000 mg | Freq: Four times a day (QID) | RECTAL | Status: DC | PRN

## 2023-04-17 MED ORDER — SODIUM CHLORIDE 0.9 % IV SOLN
1.0000 g | INTRAVENOUS | Status: DC
  Administered 2023-04-18 – 2023-04-19 (×2): 1 g via INTRAVENOUS
  Filled 2023-04-17 (×2): qty 10

## 2023-04-17 MED ORDER — HEPARIN SODIUM (PORCINE) 5000 UNIT/ML IJ SOLN
5000.0000 [IU] | Freq: Three times a day (TID) | INTRAMUSCULAR | Status: DC
  Administered 2023-04-17 – 2023-04-21 (×11): 5000 [IU] via SUBCUTANEOUS
  Filled 2023-04-17 (×11): qty 1

## 2023-04-17 MED ORDER — SENNOSIDES-DOCUSATE SODIUM 8.6-50 MG PO TABS: 1.0000 | ORAL_TABLET | Freq: Every evening | ORAL | Status: DC | PRN

## 2023-04-17 MED ORDER — POTASSIUM CHLORIDE IN NACL 40-0.9 MEQ/L-% IV SOLN
INTRAVENOUS | Status: AC
  Filled 2023-04-17 (×2): qty 1000

## 2023-04-17 MED ORDER — POTASSIUM CHLORIDE CRYS ER 20 MEQ PO TBCR
40.0000 meq | EXTENDED_RELEASE_TABLET | Freq: Once | ORAL | Status: AC
  Administered 2023-04-17: 40 meq via ORAL
  Filled 2023-04-17: qty 2

## 2023-04-17 MED ORDER — LACTATED RINGERS IV BOLUS
1000.0000 mL | Freq: Once | INTRAVENOUS | Status: AC
  Administered 2023-04-17: 1000 mL via INTRAVENOUS

## 2023-04-17 MED ORDER — ACETAMINOPHEN 325 MG PO TABS
650.0000 mg | ORAL_TABLET | Freq: Four times a day (QID) | ORAL | Status: DC | PRN
  Administered 2023-04-20 (×2): 650 mg via ORAL
  Filled 2023-04-17 (×2): qty 2

## 2023-04-17 MED ORDER — PROCHLORPERAZINE EDISYLATE 10 MG/2ML IJ SOLN: 10.0000 mg | Freq: Four times a day (QID) | INTRAMUSCULAR | Status: DC | PRN

## 2023-04-17 NOTE — ED Notes (Signed)
Went to CT

## 2023-04-17 NOTE — ED Notes (Signed)
Pt's husband and Caregiver report they just cleaned up the Pt.  Pt's brief was soaked w/ urine.

## 2023-04-17 NOTE — ED Provider Notes (Signed)
[place]MENT AT [facility] Provider Note   CSN: [id number] Arrival date & time: 04/17/23  1100     History  Chief Complaint  Patient presents with   Fatigue    Vanessa Short is a 56 y.o. female.  HPI Patient history of multiple sclerosis.  A week and a half ago had positive home COVID test.  Treated with molnupiravir.  However few days later developed fever and more cough.  Treated for pneumonia..  No x-ray done due to difficulty with mobility due to patient's MS.   now decreased oral intake.  More sedate.  Dozing off quickly.  No further fevers however.  No trauma.     Past Medical History:  Diagnosis Date   Headache    Herpes genitalis in women    History of kidney stones    Hypertension    Kidney stones    Movement disorder    Multiple sclerosis (HCC)    Neuropathy    Oral herpes    Ulcerative colitis (HCC)    Vision abnormalities     Home Medications Prior to Admission medications   Medication Sig Start Date End Date Taking? Authorizing Provider  acyclovir (ZOVIRAX) 400 MG tablet Take 400 mg by mouth daily. Take every day per patient 08/11/14  Yes [provider]  aspirin EC 81 MG tablet Take 81 mg by mouth daily.   Yes [provider]  b complex vitamins tablet Take 1 tablet by mouth daily.    Yes [provider]  cholecalciferol (VITAMIN D) 1000 UNITS tablet Take 1,000 Units by mouth daily.    Yes [provider]  famotidine (PEPCID) 20 MG tablet Take 40 mg by mouth 2 (two) times daily.   Yes [provider]  FLUoxetine (PROZAC) 20 MG capsule Take 20 mg by mouth 3 (three) times daily.   Yes [provider]  folic acid (FOLVITE) 400 MCG tablet Take 800 mcg by mouth daily.   Yes [provider]  furosemide (LASIX) 40 MG tablet Take 1 tablet (40 mg total) by mouth 2 (two) times daily. 08/19/16  Yes Rai, Ripudeep K, MD  labetalol (NORMODYNE) 100 MG tablet TAKE 1/2 TABLET BY  MOUTH DAILY 01/10/23  Yes Sater, Pearletha Furl, MD  lubiprostone (AMITIZA) 8 MCG capsule Take 8-16 mcg by mouth 2 (two) times daily with a meal. Take  in the morning and 16 mcg in the afternoon.   Yes [provider]  mesalamine (APRISO) 0.375 g 24 hr capsule Take 375 mg by mouth QID.   Yes [provider]  Multiple Vitamin (MULTIVITAMIN) tablet Take 1 tablet by mouth daily.   Yes [provider]  OXcarbazepine (TRILEPTAL) 150 MG tablet Take 1 tablet (150 mg total) by mouth 3 (three) times daily. 12/04/22  Yes Sater, Pearletha Furl, MD  QUEtiapine (SEROQUEL) 25 MG tablet One po qHS and one po every day prn 02/12/23  Yes Sater, Pearletha Furl, MD  rosuvastatin (CRESTOR) 40 MG tablet Take 40 mg by mouth daily.   Yes [provider]  tolterodine (DETROL) 2 MG tablet Take 2 mg by mouth 2 (two) times daily.  05/09/18  Yes [provider]  baclofen (LIORESAL) 10 MG tablet 1 to 2 po qid up to 8/day 02/12/23   Sater, Pearletha Furl, MD  dantrolene (DANTRIUM) 50 MG capsule Take 1 capsule (50 mg total) by mouth in the morning, at noon, in the evening, and at bedtime. Patient taking differently:  Take 50 mg by mouth 3 (three) times daily. 12/04/22   Sater, Pearletha Furl, MD  lidocaine-prilocaine (EMLA) cream Apply one inch before port-a cath access prn Patient taking differently: Apply 1 Application topically daily as needed (port access). 03/23/21   Sater, Pearletha Furl, MD  mupirocin ointment (BACTROBAN) 2 % Apply 1 Application topically 2 (two) times daily. 01/28/23   Leroy Sea, MD  potassium chloride (KLOR-CON) 10 MEQ tablet Take 20 mEq by mouth daily. 08/13/21   [provider]  sodium chloride (OCEAN) 0.65 % SOLN nasal spray Place 1 spray into both nostrils as needed for congestion. Patient taking differently: Place 1 spray into both nostrils daily as needed for congestion. 08/19/16   Rai, Ripudeep Kirtland Bouchard, MD  Teriflunomide 14 MG TABS Take 1 tablet (14 mg total) by mouth daily. HOLD  until cleared by neurology outpatient to resume 02/03/23   Rai, Delene Ruffini, MD      Allergies    Nitrofurantoin, Oxycodone-acetaminophen, Risperidone and related, and Penicillins    Review of Systems   Review of Systems  Physical Exam Updated Vital Signs BP (!) 149/75 (BP Location: Left Arm)   Pulse 86   Temp (!) 97.3 F (36.3 C) (Oral)   Resp 17   Wt 86.2 kg   SpO2 98%   BMI 33.66 kg/m  Physical Exam Vitals reviewed.  HENT:     Head: Normocephalic.  Musculoskeletal:     Cervical back: Neck supple.  Neurological:     Mental Status: She is alert.     Comments: Wheelchair-bound at baseline.  Has decreased mental status.  Some confusion.  Defers to husband asked her questions.  Is somewhat sleepy.     ED Results / Procedures / Treatments   Labs (all labs ordered are listed, but only abnormal results are displayed) Labs Reviewed  COMPREHENSIVE METABOLIC PANEL - Abnormal; Notable for the following components:      Result Value   Potassium 3.2 (*)    Glucose, Bld 140 (*)    BUN 38 (*)    Creatinine, Ser 1.76 (*)    Albumin 2.9 (*)    ALT 51 (*)    Alkaline Phosphatase 173 (*)    GFR, Estimated 33 (*)    Anion gap 17 (*)    All other components within normal limits  CBC WITH DIFFERENTIAL/PLATELET - Abnormal; Notable for the following components:   WBC 11.3 (*)    RBC 3.42 (*)    Hemoglobin 9.6 (*)    HCT 31.7 (*)    Neutro Abs 8.2 (*)    Monocytes Absolute 1.8 (*)    Abs Immature Granulocytes 0.12 (*)    All other components within normal limits  URINALYSIS, W/ REFLEX TO CULTURE (INFECTION SUSPECTED) - Abnormal; Notable for the following components:   APPearance CLOUDY (*)    Hgb urine dipstick LARGE (*)    Protein, ur 30 (*)    Leukocytes,Ua LARGE (*)    Bacteria, UA RARE (*)    All other components within normal limits  URINE CULTURE  CULTURE, BLOOD (ROUTINE X 2)  CULTURE, BLOOD (ROUTINE X 2)  HCG, SERUM, QUALITATIVE  CBC  COMPREHENSIVE METABOLIC PANEL   I-STAT CG4 LACTIC ACID, ED  I-STAT CG4 LACTIC ACID, ED    EKG None  Radiology CT Head Wo Contrast  Result Date: 04/17/2023 CLINICAL DATA:  Mental status change, unknown cause Lethargy. EXAM: CT HEAD WITHOUT CONTRAST TECHNIQUE: Contiguous axial images were obtained from the base of the  skull through the vertex without intravenous contrast. RADIATION DOSE REDUCTION: This exam was performed according to the departmental dose-optimization program which includes automated exposure control, adjustment of the mA and/or kV according to patient size and/or use of iterative reconstruction technique. COMPARISON:  Brain MRI 01/31/2023 FINDINGS: Brain: Again seen generalized atrophy. Periventricular and deep white matter hypodensity, likely related to demyelination or mass compared with prior MRI. No hemorrhage, evidence of acute infarct, hydrocephalus or extra-axial collection. Vascular: No hyperdense vessel or unexpected calcification. Skull: Normal. Negative for fracture or focal lesion. Sinuses/Orbits: Chronic mucosal thickening and opacification of the sphenoid sinus. No acute findings. No mastoid effusion. Other: None. IMPRESSION: 1. No acute intracranial abnormality. 2. Generalized atrophy. Periventricular and deep white matter hypodensity, likely related to demyelination when compared with prior MRI in the setting of multiple sclerosis. Electronically Signed   By: Narda Rutherford M.D.   On: 04/17/2023 18:46   DG Chest Port 1 View  Result Date: 04/17/2023 CLINICAL DATA:  Cough for 1 week. EXAM: PORTABLE CHEST 1 VIEW COMPARISON:  January 31, 2023. FINDINGS: Stable cardiomediastinal silhouette. Right internal jugular Port-A-Cath is unchanged. Lungs are clear. Bony thorax is unremarkable. IMPRESSION: No active disease. Electronically Signed   By: Lupita Raider M.D.   On: 04/17/2023 15:46    Procedures Procedures    Medications Ordered in ED Medications  cefTRIAXone (ROCEPHIN) 1 g in sodium chloride 0.9  % 100 mL IVPB (has no administration in time range)  0.9 % NaCl with KCl 40 mEq / L  infusion ( Intravenous New Bag/Given 04/18/23 0604)  heparin injection 5,000 Units (5,000 Units Subcutaneous Given 04/18/23 1610)  acetaminophen (TYLENOL) tablet 650 mg (has no administration in time range)    Or  acetaminophen (TYLENOL) suppository 650 mg (has no administration in time range)  senna-docusate (Senokot-S) tablet 1 tablet (has no administration in time range)  prochlorperazine (COMPAZINE) injection 10 mg (has no administration in time range)  lubiprostone (AMITIZA) capsule 16 mcg (has no administration in time range)  rosuvastatin (CRESTOR) tablet 20 mg (has no administration in time range)  mesalamine (PENTASA) CR capsule 500 mg (has no administration in time range)  lubiprostone (AMITIZA) capsule 8 mcg (has no administration in time range)  lactated ringers bolus 1,000 mL (0 mLs Intravenous Stopped 04/17/23 1603)  lactated ringers bolus 1,000 mL (1,000 mLs Intravenous New Bag/Given 04/17/23 1904)  cefTRIAXone (ROCEPHIN) 1 g in sodium chloride 0.9 % 100 mL IVPB (0 g Intravenous Stopped 04/17/23 1947)  potassium chloride SA (KLOR-CON M) CR tablet 40 mEq (40 mEq Oral Given 04/17/23 2107)    ED Course/ Medical Decision Making/ A&P Clinical Course as of 04/18/23 0658  Tue Apr 17, 2023  1522 Received sign out pending labs. Was recently treated for COVID and pneumonia. Received fluids. Has been increased confusion. Labs with signs of dehydration.  [WS]  1921 Urine is concerning for urine infection, significant WBCs and bacteria.  Will give antibiotics.  Discussed with Dr. Allena Katz to admit the patient.  Also has some hypokalemia we will give some potassium. [WS]    Clinical Course User Index [WS] Lonell Grandchild, MD                                 Medical Decision Making Amount and/or Complexity of Data Reviewed Labs: ordered. Radiology: ordered.  Risk Prescription drug management. Decision  regarding hospitalization.   Patient mental status change.  Recently treated  for both COVID and pneumonia.  Does not have fever here.  Differential diagnosis includes metabolic encephalopathy, infection, dehydration.     She has increased creatinine.  Likely has component of dehydration.  Fluid bolus will be given.  Will check urinalysis and chest x-ray.  Likely require admission to the hospital.  Care turned over to Dr.Scheving        Final Clinical Impression(s) / ED Diagnoses Final diagnoses:  AKI (acute kidney injury) [facility])    Rx / DC Orders ED Discharge Orders     None         Benjiman Core, MD 04/18/23 [phone]

## 2023-04-17 NOTE — ED Triage Notes (Signed)
Tested positive for Covid x 1 weeks ago; given rx for lageviro, completed round; Friday taken to PCP, was given abx for poss pneumonia; no improvement ; per family, slept all day yesterday; per pt's family very lethargic today, continues to have cough; hx MS, HTN; pt answers questions in triage but dozes off quickly

## 2023-04-17 NOTE — Hospital Course (Signed)
Vanessa Short is a 57 y.o. 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

## 2023-04-17 NOTE — H&P (Signed)
History and Physical    Vanessa Short UJW:[id number] DOB: [birth year] DOA: 04/17/2023  PCP: Richmond Campbell., [name]  Patient coming from: Home  I have personally briefly reviewed patient's old medical records in [facility]  Chief Complaint: Lethargy  HPI: Vanessa Short is a 57 y.o. 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  History is supplemented by spouse at bedside.  About 11-12 days ago patient developed cough, chest congestion, and fevers at home.  She tested positive for COVID-19 viral infection using an at home test.  She was treated with a course of molnupiravir which she completed.  She was still having persistent cough and chest congestion and this past Friday she was given a course of antibiotics with doxycycline/cefdinir to treat presumed pneumonia/bronchitis.  Over these last 2 days she has been very somnolent and sleeping pretty much all day.  She has not been eating or drinking as much.  She is bed/wheelchair bound at baseline with only some mobility remaining in her right arm otherwise strength nearly absent at all other extremities.  Patient is somnolent on arrival.  She she will awaken to voice and begin to answer questions appropriately before she falls asleep midsentence.  She had recent admissions for similar in June.  At that time she was hallucinating but spouse states that she has not had any obvious hallucinations this time.  LTM EEG in June was negative for evidence of seizure activity.  ED Course  Labs/Imaging on admission: I have personally reviewed following labs and imaging studies.  Initial vitals showed BP 121/70, pulse 71, RR 16, temp 98.8 F, SpO2 99% on room air.  Labs show WBC 11.3, hemoglobin 9.6,  platelets 263,000, sodium 143, potassium 3.2, bicarb 22, BUN 38, creatinine 1.76 (baseline 1.0-1.1), AST 30, ALT 51, alk phos 173, total bilirubin 0.7, serum hCG negative.  Lactic acid 1.6 > 1.1.  Urinalysis shows negative nitrites, large leukocytes, >50 RBCs and WBCs, rare bacteria.  Urine and blood cultures in process.  Portable chest x-ray negative for focal consolidation, edema, effusion.  Right IJ Port-A-Cath is unchanged.  CT head without contrast negative for acute intracranial normality.  Generalized atrophy noted.  Periventricular and deep white matter hypodensity likely related to demyelination when compared with prior MRI in the setting of multiple sclerosis.  Patient was given 1 L LR, IV ceftriaxone, oral K 40 mEq.  The hospitalist service was consulted to admit for further evaluation and management.  Review of Systems: All systems reviewed and are negative except as documented in history of present illness above.   Past Medical History:  Diagnosis Date   Headache    Herpes genitalis in women    History of kidney stones    Hypertension    Kidney stones    Movement disorder    Multiple sclerosis (HCC)    Neuropathy    Oral herpes    Ulcerative colitis (HCC)    Vision abnormalities     Past Surgical History:  Procedure Laterality Date   ENDOMETRIAL ABLATION  10/10/2017   KIDNEY STONE SURGERY     PORTACATH PLACEMENT N/A 10/16/2017   Procedure: ULTRA SOUND GUIDED INSERTION PORT-A-CATH ERAS PATHWAY;  Surgeon: Rodman Pickle, MD;  Location: [facility] ORS;  Service: General;  Laterality: N/A;    Social History:  reports that she has never smoked. She has never used smokeless tobacco. She reports current alcohol use. She reports that she does not use drugs.  Allergies  Allergen Reactions   Nitrofurantoin Other (See Comments)    syncope   Oxycodone-Acetaminophen Other (See Comments)    Other   Risperidone And Related     Became very lethargic   Penicillins Rash    Has  patient had a PCN reaction causing immediate rash, facial/tongue/throat swelling, SOB or lightheadedness with hypotension: NO Has patient had a PCN reaction causing severe rash involving mucus membranes or skin necrosis: NO Has patient had a PCN reaction that required hospitalization NO Has patient had a PCN reaction occurring within the last 10 years:NO If all of the above answers are "NO", then may proceed with Cephalosporin use.    Family History  Problem Relation Age of Onset   Dementia Mother    Hypertension Father    Hyperlipidemia Father    Diabetes Father    Heart disease Father    Breast cancer Neg Hx      Prior to Admission medications   Medication Sig Start Date End Date Taking? Authorizing Provider  acyclovir (ZOVIRAX) 400 MG tablet Take 400 mg by mouth daily. Take every day per patient 08/11/14   [provider]  aspirin EC 81 MG tablet Take 81 mg by mouth daily.    [provider]  b complex vitamins tablet Take 1 tablet by mouth daily.     [provider]  baclofen (LIORESAL) 10 MG tablet 1 to 2 po qid up to 8/day 02/12/23   Sater, Pearletha Furl, MD  cholecalciferol (VITAMIN D) 1000 UNITS tablet Take 1,000 Units by mouth daily.     [provider]  dantrolene (DANTRIUM) 50 MG capsule Take 1 capsule (50 mg total) by mouth in the morning, at noon, in the evening, and at bedtime. Patient taking differently: Take 50 mg by mouth 3 (three) times daily. 12/04/22   Sater, Pearletha Furl, MD  famotidine (PEPCID) 20 MG tablet Take 40 mg by mouth 2 (two) times daily.    [provider]  FLUoxetine (PROZAC) 20 MG capsule Take 20 mg by mouth 3 (three) times daily.    [provider]  folic acid (FOLVITE) 400 MCG tablet Take 800 mcg by mouth daily.    [provider]  furosemide (LASIX) 40 MG tablet Take 1 tablet (40 mg total) by mouth 2 (two) times daily. 08/19/16   Rai, Ripudeep Kirtland Bouchard, MD  labetalol (NORMODYNE) 100 MG tablet TAKE 1/2  TABLET BY MOUTH DAILY 01/10/23   Sater, Pearletha Furl, MD  lidocaine-prilocaine (EMLA) cream Apply one inch before port-a cath access prn Patient taking differently: Apply 1 Application topically daily as needed (port access). 03/23/21   Sater, Pearletha Furl, MD  lubiprostone (AMITIZA) 8 MCG capsule Take 8-16 mcg by mouth 2 (two) times daily with a meal. Take  in the morning and 16 mcg in the afternoon.    [provider]  mesalamine (APRISO) 0.375 g 24 hr capsule Take 375 mg by mouth QID.    [provider]  Multiple Vitamin (MULTIVITAMIN) tablet Take 1 tablet by mouth daily.    [provider]  mupirocin ointment (BACTROBAN) 2 % Apply 1 Application topically 2 (two) times daily. 01/28/23   Leroy Sea, MD  OXcarbazepine (TRILEPTAL) 150 MG tablet Take 1 tablet (150 mg total) by mouth 3 (three) times daily. 12/04/22   Despina Arias  A, MD  potassium chloride (KLOR-CON) 10 MEQ tablet Take 20 mEq by mouth daily. 08/13/21   [provider]  QUEtiapine (SEROQUEL) 25 MG tablet One po qHS and one po every day prn 02/12/23   Sater, Pearletha Furl, MD  rosuvastatin (CRESTOR) 40 MG tablet Take 40 mg by mouth daily.    [provider]  sodium chloride (OCEAN) 0.65 % SOLN nasal spray Place 1 spray into both nostrils as needed for congestion. Patient taking differently: Place 1 spray into both nostrils daily as needed for congestion. 08/19/16   Rai, Ripudeep Kirtland Bouchard, MD  Teriflunomide 14 MG TABS Take 1 tablet (14 mg total) by mouth daily. HOLD until cleared by neurology outpatient to resume 02/03/23   Rai, Delene Ruffini, MD  tolterodine (DETROL) 2 MG tablet Take 2 mg by mouth 2 (two) times daily.  05/09/18   [provider]    Physical Exam: Vitals:   04/17/23 1630 04/17/23 1655 04/17/23 1830 04/17/23 1930  BP: (!) 132/55  137/69 138/64  Pulse: 78  81 82  Resp: 16     Temp:  98.8 F (37.1 C)    TempSrc:  Oral    SpO2: 100%  100% 100%   Constitutional: Resting in bed,  somnolent.  Awakens briefly to voice/light stimulation Eyes: EOMI, lids and conjunctivae normal ENMT: Mucous membranes are moist. Posterior pharynx clear of any exudate or lesions.Normal dentition.  Neck: normal, supple, no masses. Respiratory: clear to auscultation anteriorly. Normal respiratory effort. No accessory muscle use.  Cardiovascular: Regular rate and rhythm, no murmurs / rubs / gallops. No extremity edema. 2+ pedal pulses. Abdomen: no tenderness, no masses palpated. Musculoskeletal: Spastic quadriparesis, ROM diminished all extremities Skin: no rashes, lesions, ulcers. No induration Neurologic: Strength 0/5 bilateral lower extremities and LLE, 1/5 RLE Psychiatric: Somnolent, awakens briefly to answer questions.  Alert and oriented x 3.  Falling asleep midsentence..   EKG: Not performed.  Assessment/Plan Principal Problem:   Acute kidney injury superimposed on chronic kidney disease (HCC) Active Problems:   Acute encephalopathy   Obstructive sleep apnea   Multiple sclerosis (HCC)   Spastic quadriparesis (HCC)   Normocytic anemia   Hypertension   Anxiety and depression   Dyslipidemia   Vanessa Short is a 57 y.o. 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  Assessment and Plan: Acute kidney injury superimposed on CKD stage IIIa: Creatinine 1.76 on admission compared to baseline ~1.0-1.1.  Likely prerenal from dehydration in setting of recent COVID-19 viral infection with poor p.o. intake. -Continue IV fluid hydration overnight -Avoid NSAIDs and other nephrotoxic meds  Acute encephalopathy/somnolence: Multifactorial from potential dehydration, UTI, decreased clearance of sedating meds in setting of AKI, and background of severe sleep  apnea.  CXR negative for pneumonia.  CT head negative for acute intracranial normality. -Continue IV fluid hydration as above -Continue IV ceftriaxone, follow urine culture -Majority of home meds will be on hold for now due to potential sedative effects  Hypokalemia: Supplement and recheck in AM.  Normocytic anemia: Hemoglobin is decreased at 9.6 from previous baseline 11-12.  No obvious bleeding.  Will continue to monitor.  Multiple sclerosis with spastic quadriparesis: Wheelchair-bound at baseline with lack of strength in lower extremities and significant loss of strength LUE.  Still with some mobility RUE although function  limited due to spasms. -Hold teriflunomide pending med rec (not clear if she is still taking based on most recent neurology note) -Holding baclofen, dantrolene, Trileptal  Hypertension: BP stable, resume antihypertensives as warranted.  Hyperlipidemia: Continue rosuvastatin.  Ulcerative colitis: Continue mesalamine and Amitiza.  Anxiety/depression: Holding Seroquel and Prozac for now.  Sleep apnea: Following with neurology, found to have severe combined central and obstructive sleep apnea.  Awaiting in lab titration for home BiPAP.   DVT prophylaxis: heparin injection 5,000 Units Start: 04/17/23 2200 Code Status: Full code, confirmed with patient and spouse on admission Family Communication: Spouse at bedside Disposition Plan: From home and likely discharge to home pending clinical progress Consults called: None Severity of Illness: The appropriate patient status for this patient is OBSERVATION. Observation status is judged to be reasonable and necessary in order to provide the required intensity of service to ensure the patient's safety. The patient's presenting symptoms, physical exam findings, and initial radiographic and laboratory data in the context of their medical condition is felt to place them at decreased risk for further clinical deterioration.  Furthermore, it is anticipated that the patient will be medically stable for discharge from the hospital within 2 midnights of admission.   Darreld Mclean MD [facility]  If 7PM-7AM, please contact night-coverage [email or web address]  04/17/2023, 8:14 PM

## 2023-04-17 NOTE — ED Provider Notes (Signed)
   ED Course / MDM   Clinical Course as of 04/17/23 1921  Tue Apr 17, 2023  1522 Received sign out pending labs. Was recently treated for COVID and pneumonia. Received fluids. Has been increased confusion. Labs with signs of dehydration.  [WS]  1921 Urine is concerning for urine infection, significant WBCs and bacteria.  Will give antibiotics.  Discussed with Dr. Allena Katz to admit the patient.  Also has some hypokalemia we will give some potassium. [WS]    Clinical Course User Index [WS] Lonell Grandchild, MD   Medical Decision Making Amount and/or Complexity of Data Reviewed Labs: ordered. Radiology: ordered.  Risk Prescription drug management. Decision regarding hospitalization.          Lonell Grandchild, MD 04/17/23 [phone]

## 2023-04-18 DIAGNOSIS — N189 Chronic kidney disease, unspecified: Secondary | ICD-10-CM | POA: Diagnosis not present

## 2023-04-18 DIAGNOSIS — N179 Acute kidney failure, unspecified: Secondary | ICD-10-CM | POA: Diagnosis not present

## 2023-04-18 LAB — CBC
HCT: 28.4 % — ABNORMAL LOW (ref 36.0–46.0)
Hemoglobin: 9.2 g/dL — ABNORMAL LOW (ref 12.0–15.0)
MCH: 29.7 pg (ref 26.0–34.0)
MCHC: 32.4 g/dL (ref 30.0–36.0)
MCV: 91.6 fL (ref 80.0–100.0)
Platelets: 209 K/uL (ref 150–400)
RBC: 3.1 MIL/uL — ABNORMAL LOW (ref 3.87–5.11)
RDW: 21.1 % — ABNORMAL HIGH (ref 11.5–15.5)
WBC: 14.6 K/uL — ABNORMAL HIGH (ref 4.0–10.5)
nRBC: 1.4 % — ABNORMAL HIGH (ref 0.0–0.2)

## 2023-04-18 LAB — COMPREHENSIVE METABOLIC PANEL WITH GFR
ALT: 144 U/L — ABNORMAL HIGH (ref 0–44)
AST: 157 U/L — ABNORMAL HIGH (ref 15–41)
Albumin: 3.6 g/dL (ref 3.5–5.0)
Alkaline Phosphatase: 49 U/L (ref 38–126)
Anion gap: 13 (ref 5–15)
BUN: 12 mg/dL (ref 6–20)
CO2: 25 mmol/L (ref 22–32)
Calcium: 8.9 mg/dL (ref 8.9–10.3)
Chloride: 98 mmol/L (ref 98–111)
Creatinine, Ser: 0.85 mg/dL (ref 0.44–1.00)
GFR, Estimated: >60 mL/min
Glucose, Bld: 95 mg/dL (ref 70–99)
Potassium: 4.2 mmol/L (ref 3.5–5.1)
Sodium: 136 mmol/L (ref 135–145)
Total Bilirubin: 4 mg/dL — ABNORMAL HIGH (ref 0.3–1.2)
Total Protein: 7 g/dL (ref 6.5–8.1)

## 2023-04-18 LAB — URINE CULTURE: Culture: NO GROWTH

## 2023-04-18 MED ORDER — LUBIPROSTONE 8 MCG PO CAPS
8.0000 ug | ORAL_CAPSULE | Freq: Every day | ORAL | Status: DC
  Administered 2023-04-18 – 2023-04-19 (×2): 8 ug via ORAL
  Filled 2023-04-18 (×4): qty 1

## 2023-04-18 MED ORDER — ROSUVASTATIN CALCIUM 20 MG PO TABS
20.0000 mg | ORAL_TABLET | Freq: Every day | ORAL | Status: DC
  Administered 2023-04-18 – 2023-04-21 (×4): 20 mg via ORAL
  Filled 2023-04-18 (×4): qty 1

## 2023-04-18 MED ORDER — CHLORHEXIDINE GLUCONATE CLOTH 2 % EX PADS
6.0000 | MEDICATED_PAD | Freq: Every day | CUTANEOUS | Status: DC
  Administered 2023-04-18 – 2023-04-21 (×4): 6 via TOPICAL

## 2023-04-18 MED ORDER — LUBIPROSTONE 8 MCG PO CAPS
16.0000 ug | ORAL_CAPSULE | Freq: Every day | ORAL | Status: DC
  Administered 2023-04-18: 16 ug via ORAL
  Filled 2023-04-18 (×4): qty 2

## 2023-04-18 MED ORDER — LIDOCAINE-PRILOCAINE 2.5-2.5 % EX CREA
TOPICAL_CREAM | Freq: Once | CUTANEOUS | Status: AC
  Filled 2023-04-18: qty 5

## 2023-04-18 MED ORDER — MESALAMINE ER 250 MG PO CPCR
500.0000 mg | ORAL_CAPSULE | Freq: Three times a day (TID) | ORAL | Status: DC
  Administered 2023-04-18 – 2023-04-21 (×10): 500 mg via ORAL
  Filled 2023-04-18 (×12): qty 2

## 2023-04-18 MED ORDER — FLUTICASONE PROPIONATE 50 MCG/ACT NA SUSP
1.0000 | Freq: Every day | NASAL | Status: DC
  Filled 2023-04-18: qty 16

## 2023-04-18 NOTE — Plan of Care (Signed)
  Problem: Clinical Measurements: Goal: Will remain free from infection Outcome: Progressing Goal: Respiratory complications will improve Outcome: Progressing   Problem: Elimination: Goal: Will not experience complications related to bowel motility Outcome: Progressing   Problem: Safety: Goal: Ability to remain free from injury will improve Outcome: Progressing

## 2023-04-18 NOTE — Progress Notes (Signed)
PROGRESS NOTE  Vanessa Short  BMW:[id number] DOB: 12-24-1965 DOA: 04/17/2023 PCP: Richmond Campbell., [name]   Brief Narrative: Patient is a 57 year old female with history of multiple sclerosis with spastic quadriparesis, ulcerative colitis, CKD stage III, hypertension, hyperlipidemia, OSA not on CPAP who presented with lethargy, somnolence from home.  Also reported cough, congestion, fever.  She was recently tested for COVID and she took a course of molnupiravir which she completed.  Was also on antibiotic for persistent cough, congestion.  Patient was somnolent on arrival.  Hemodynamically stable on presentation.  Potassium 3.2 creatinine of, creatinine 1.7 on presentation.  Urinalysis was suspicious for UTI.  CT head negative for acute findings.  Patient was started on IV fluids, started on antibiotics.  AKI has resolved.  Mentation significantly improved.  Possible discharge home tomorrow  Assessment & Plan:  Principal Problem:   Acute kidney injury superimposed on chronic kidney disease (HCC) Active Problems:   Acute encephalopathy   Obstructive sleep apnea   Multiple sclerosis (HCC)   Spastic quadriparesis (HCC)   Normocytic anemia   Hypertension   Anxiety and depression   Dyslipidemia   AKI in CKD stage IIIa: Baseline creatinine ranged from 1-1.1.  Presented with creatinine in the range of 1.7.  Currently kidney function has normalized.  Acute encephalopathy/somnolence: Multifactorial, could be from AKI, UTI or polypharmacy.  Chest x-ray negative for pneumonia.  CT head negative for intracranial abnormality.  Also takes baclofen, dantrolene, Seroquel, Prozac at home  Suspected UTI/leukocytosis: Lactate level normal.  Follow-up urine culture.  UA is strongly suspicious of UTI.  Has leukocytosis.  Afebrile.  Denies any dysuria but she may be insensitive due to her history of multiple sclerosis.  No abdominal tenderness  Hypokalemia: Supplemented and corrected  Normocytic anemia:  Current hemoglobin stable  Multiple sclerosis/spastic quadriparesis: Wheelchair-bound at baseline.  Takes baclofen, dantrolene, Trileptal at home.  We recommend follow-up with neurology as an outpatient  Hypertension: Continue current blood pressure medication.  Monitor blood pressure  Hyperlipidemia: Continue rosuvastatin  History of ulcerative colitis: On mesalamine, Amitiza  Anxiety/depression: On Seroquel and Prozac at home  Elevated liver enzymes: AST/ALT jumped to the range of 100s.  Check CMP tomorrow  Sleep apnea: Does not use CPAP.Marland Kitchen Awaiting in lab titration for home BiPAP      DVT prophylaxis:heparin injection 5,000 Units Start: 04/17/23 2200     Code Status: Full Code  Family Communication: Discussed with husband at bedside  Patient status:Obs  Patient is from :Home  Anticipated discharge UV:[id number]  Estimated DC date: Tomorrow   Consultants: None  Procedures:None  Antimicrobials:  Anti-infectives (From admission, onward)    Start     Dose/Rate Route Frequency Ordered Stop   04/18/23 1000  cefTRIAXone (ROCEPHIN) 1 g in sodium chloride 0.9 % 100 mL IVPB        1 g 200 mL/hr over 30 Minutes Intravenous Every 24 hours 04/17/23 2005     04/17/23 1900  cefTRIAXone (ROCEPHIN) 1 g in sodium chloride 0.9 % 100 mL IVPB        1 g 200 mL/hr over 30 Minutes Intravenous  Once 04/17/23 1855 04/17/23 1947       Subjective: Patient seen and examined the bedside today.  Hemodynamically stable.  She has been more alert and mostly oriented today.  Still looks weak.  Husband at bedside.  Denies any nausea, vomiting or abdominal pain.  Objective: Vitals:   04/17/23 1930 04/17/23 2118 04/18/23 0500 04/18/23 0738  BP: 138/64 Marland Kitchen)  149/75  (!) 140/60  Pulse: 82 86  89  Resp:  17    Temp:  (!) 97.3 F (36.3 C)  98.1 F (36.7 C)  TempSrc:  Oral    SpO2: 100% 98%  91%  Weight:   86.2 kg     Intake/Output Summary (Last 24 hours) at 04/18/2023 1027 Last data filed at  04/18/2023 0839 Gross per 24 hour  Intake 1461.05 ml  Output 250 ml  Net 1211.05 ml   Filed Weights   04/18/23 0500  Weight: 86.2 kg    Examination:  General exam: Overall comfortable, not in distress,  obese HEENT: PERRL Respiratory system:  no wheezes or crackles  Cardiovascular system: S1 & S2 heard, RRR.  Gastrointestinal system: Abdomen is nondistended, soft and nontender. Central nervous system: Alert and oriented Extremities: No edema, no clubbing ,no cyanosis, spastic quadriparesis Skin: No rashes, no ulcers,no icterus     Data Reviewed: I have personally reviewed following labs and imaging studies  CBC: Recent Labs  Lab 04/17/23 1201 04/18/23 0852  WBC 11.3* 14.6*  NEUTROABS 8.2*  --   HGB 9.6* 9.2*  HCT 31.7* 28.4*  MCV 92.7 91.6  PLT 263 209   Basic Metabolic Panel: Recent Labs  Lab 04/17/23 1201 04/18/23 0852  NA 143 136  K 3.2* 4.2  CL 104 98  CO2 22 25  GLUCOSE 140* 95  BUN 38* 12  CREATININE 1.76* 0.85  CALCIUM 9.2 8.9     Recent Results (from the past 240 hour(s))  Culture, blood (routine x 2)     Status: None (Preliminary result)   Collection Time: 04/17/23  2:39 PM   Specimen: BLOOD  Result Value Ref Range Status   Specimen Description BLOOD RIGHT ANTECUBITAL  Final   Special Requests   Final    BOTTLES DRAWN AEROBIC AND ANAEROBIC Blood Culture results may not be optimal due to an excessive volume of blood received in culture bottles   Culture   Final    NO GROWTH < 24 HOURS Performed at [facility], [address] [place], [place] [address]    Report Status PENDING  Incomplete  Culture, blood (routine x 2)     Status: None (Preliminary result)   Collection Time: 04/17/23  2:45 PM   Specimen: BLOOD RIGHT FOREARM  Result Value Ref Range Status   Specimen Description BLOOD RIGHT FOREARM  Final   Special Requests   Final    BOTTLES DRAWN AEROBIC AND ANAEROBIC Blood Culture results may not be optimal due to an excessive  volume of blood received in culture bottles   Culture   Final    NO GROWTH < 24 HOURS Performed at [facility], [address], [place], [place] [address]    Report Status PENDING  Incomplete  Urine Culture     Status: None (Preliminary result)   Collection Time: 04/17/23  4:04 PM   Specimen: Urine, Clean Catch  Result Value Ref Range Status   Specimen Description URINE, CLEAN CATCH  Final   Special Requests   Final    NONE Reflexed from [id number] Performed at [facility], [address], [place], [place] [address]    Culture PENDING  Incomplete   Report Status PENDING  Incomplete     Radiology Studies: CT Head Wo Contrast  Result Date: 04/17/2023 CLINICAL DATA:  Mental status change, unknown cause Lethargy. EXAM: CT HEAD WITHOUT CONTRAST TECHNIQUE: Contiguous axial images were obtained from the  base of the skull through the vertex without intravenous contrast. RADIATION DOSE REDUCTION: This exam was performed according to the departmental dose-optimization program which includes automated exposure control, adjustment of the mA and/or kV according to patient size and/or use of iterative reconstruction technique. COMPARISON:  Brain MRI 01/31/2023 FINDINGS: Brain: Again seen generalized atrophy. Periventricular and deep white matter hypodensity, likely related to demyelination or mass compared with prior MRI. No hemorrhage, evidence of acute infarct, hydrocephalus or extra-axial collection. Vascular: No hyperdense vessel or unexpected calcification. Skull: Normal. Negative for fracture or focal lesion. Sinuses/Orbits: Chronic mucosal thickening and opacification of the sphenoid sinus. No acute findings. No mastoid effusion. Other: None. IMPRESSION: 1. No acute intracranial abnormality. 2. Generalized atrophy. Periventricular and deep white matter hypodensity, likely related to demyelination when compared with prior MRI in the setting of multiple sclerosis. Electronically Signed   By:  Narda Rutherford M.D.   On: 04/17/2023 18:46   DG Chest Port 1 View  Result Date: 04/17/2023 CLINICAL DATA:  Cough for 1 week. EXAM: PORTABLE CHEST 1 VIEW COMPARISON:  January 31, 2023. FINDINGS: Stable cardiomediastinal silhouette. Right internal jugular Port-A-Cath is unchanged. Lungs are clear. Bony thorax is unremarkable. IMPRESSION: No active disease. Electronically Signed   By: Lupita Raider M.D.   On: 04/17/2023 15:46    Scheduled Meds:  heparin  5,000 Units Subcutaneous Q8H   lidocaine-prilocaine   Topical Once   lubiprostone  16 mcg Oral Q supper   lubiprostone  8 mcg Oral Q breakfast   mesalamine  500 mg Oral TID   rosuvastatin  20 mg Oral Daily   Continuous Infusions:  cefTRIAXone (ROCEPHIN)  IV 1 g (04/18/23 0845)     LOS: 0 days   Burnadette Pop, MD [facility] P9/11/2022, 10:27 AM

## 2023-04-18 NOTE — Plan of Care (Signed)
  Problem: Education: Goal: Knowledge of General Education information will improve Description: Including pain rating scale, medication(s)/side effects and non-pharmacologic comfort measures Outcome: Progressing   Problem: Health Behavior/Discharge Planning: Goal: Ability to manage health-related needs will improve Outcome: Progressing   Problem: Clinical Measurements: Goal: Ability to maintain clinical measurements within normal limits will improve Outcome: Progressing   Problem: Nutrition: Goal: Adequate nutrition will be maintained Outcome: Progressing   Problem: Activity: Goal: Risk for activity intolerance will decrease Outcome: Progressing   Problem: Pain Managment: Goal: General experience of comfort will improve Outcome: Progressing   Problem: Skin Integrity: Goal: Risk for impaired skin integrity will decrease Outcome: Progressing

## 2023-04-18 NOTE — Care Management Obs Status (Signed)
MEDICARE OBSERVATION STATUS NOTIFICATION   Patient Details  Name: Vanessa Short MRN: [id number] Date of Birth: 03-07-1966   Medicare Observation Status Notification Given:  Yes    Lawerance Sabal, RN 04/18/2023, 9:14 AM

## 2023-04-18 NOTE — Progress Notes (Signed)
Port acess requested at this time. Pt requesting numbing medication prior to accessing. Discussed with primary RN getting order from MD to use home numbing cream, which pt has at bedside. To return after efficacy reached to access port.

## 2023-04-19 ENCOUNTER — Encounter (HOSPITAL_COMMUNITY): Payer: Self-pay | Admitting: Internal Medicine

## 2023-04-19 DIAGNOSIS — G35 Multiple sclerosis: Secondary | ICD-10-CM | POA: Diagnosis present

## 2023-04-19 DIAGNOSIS — R748 Abnormal levels of other serum enzymes: Secondary | ICD-10-CM | POA: Diagnosis present

## 2023-04-19 DIAGNOSIS — Z7982 Long term (current) use of aspirin: Secondary | ICD-10-CM | POA: Diagnosis not present

## 2023-04-19 DIAGNOSIS — N189 Chronic kidney disease, unspecified: Secondary | ICD-10-CM | POA: Diagnosis not present

## 2023-04-19 DIAGNOSIS — N179 Acute kidney failure, unspecified: Secondary | ICD-10-CM | POA: Diagnosis present

## 2023-04-19 DIAGNOSIS — G4733 Obstructive sleep apnea (adult) (pediatric): Secondary | ICD-10-CM | POA: Diagnosis present

## 2023-04-19 DIAGNOSIS — Z885 Allergy status to narcotic agent status: Secondary | ICD-10-CM | POA: Diagnosis not present

## 2023-04-19 DIAGNOSIS — G8250 Quadriplegia, unspecified: Secondary | ICD-10-CM | POA: Diagnosis present

## 2023-04-19 DIAGNOSIS — Z881 Allergy status to other antibiotic agents status: Secondary | ICD-10-CM | POA: Diagnosis not present

## 2023-04-19 DIAGNOSIS — Z79899 Other long term (current) drug therapy: Secondary | ICD-10-CM | POA: Diagnosis not present

## 2023-04-19 DIAGNOSIS — G928 Other toxic encephalopathy: Secondary | ICD-10-CM | POA: Diagnosis present

## 2023-04-19 DIAGNOSIS — E785 Hyperlipidemia, unspecified: Secondary | ICD-10-CM | POA: Diagnosis present

## 2023-04-19 DIAGNOSIS — D631 Anemia in chronic kidney disease: Secondary | ICD-10-CM | POA: Diagnosis present

## 2023-04-19 DIAGNOSIS — Z993 Dependence on wheelchair: Secondary | ICD-10-CM | POA: Diagnosis not present

## 2023-04-19 DIAGNOSIS — Z888 Allergy status to other drugs, medicaments and biological substances status: Secondary | ICD-10-CM | POA: Diagnosis not present

## 2023-04-19 DIAGNOSIS — F419 Anxiety disorder, unspecified: Secondary | ICD-10-CM | POA: Diagnosis present

## 2023-04-19 DIAGNOSIS — I129 Hypertensive chronic kidney disease with stage 1 through stage 4 chronic kidney disease, or unspecified chronic kidney disease: Secondary | ICD-10-CM | POA: Diagnosis present

## 2023-04-19 DIAGNOSIS — G4731 Primary central sleep apnea: Secondary | ICD-10-CM | POA: Diagnosis present

## 2023-04-19 DIAGNOSIS — E876 Hypokalemia: Secondary | ICD-10-CM | POA: Diagnosis present

## 2023-04-19 DIAGNOSIS — N1831 Chronic kidney disease, stage 3a: Secondary | ICD-10-CM | POA: Diagnosis present

## 2023-04-19 DIAGNOSIS — Z8616 Personal history of COVID-19: Secondary | ICD-10-CM | POA: Diagnosis not present

## 2023-04-19 DIAGNOSIS — F32A Depression, unspecified: Secondary | ICD-10-CM | POA: Diagnosis present

## 2023-04-19 DIAGNOSIS — Z880 Allergy status to penicillin: Secondary | ICD-10-CM | POA: Diagnosis not present

## 2023-04-19 DIAGNOSIS — K5190 Ulcerative colitis, unspecified, without complications: Secondary | ICD-10-CM | POA: Diagnosis present

## 2023-04-19 DIAGNOSIS — E860 Dehydration: Secondary | ICD-10-CM | POA: Diagnosis present

## 2023-04-19 LAB — COMPREHENSIVE METABOLIC PANEL
ALT: 31 U/L (ref 0–44)
AST: 16 U/L (ref 15–41)
Albumin: 2.5 g/dL — ABNORMAL LOW (ref 3.5–5.0)
Alkaline Phosphatase: 150 U/L — ABNORMAL HIGH (ref 38–126)
Anion gap: 9 (ref 5–15)
BUN: 23 mg/dL — ABNORMAL HIGH (ref 6–20)
CO2: 21 mmol/L — ABNORMAL LOW (ref 22–32)
Calcium: 8.9 mg/dL (ref 8.9–10.3)
Chloride: 113 mmol/L — ABNORMAL HIGH (ref 98–111)
Creatinine, Ser: 1.41 mg/dL — ABNORMAL HIGH (ref 0.44–1.00)
GFR, Estimated: 43 mL/min — ABNORMAL LOW (ref >60)
Glucose, Bld: 114 mg/dL — ABNORMAL HIGH (ref 70–99)
Potassium: 3.6 mmol/L (ref 3.5–5.1)
Sodium: 143 mmol/L (ref 135–145)
Total Bilirubin: 0.7 mg/dL (ref 0.3–1.2)
Total Protein: 5.7 g/dL — ABNORMAL LOW (ref 6.5–8.1)

## 2023-04-19 LAB — CBC
HCT: 27.3 % — ABNORMAL LOW (ref 36.0–46.0)
Hemoglobin: 8.2 g/dL — ABNORMAL LOW (ref 12.0–15.0)
MCH: 28.5 pg (ref 26.0–34.0)
MCHC: 30 g/dL (ref 30.0–36.0)
MCV: 94.8 fL (ref 80.0–100.0)
Platelets: 245 10*3/uL (ref 150–400)
RBC: 2.88 MIL/uL — ABNORMAL LOW (ref 3.87–5.11)
RDW: 15.4 % (ref 11.5–15.5)
WBC: 10.6 10*3/uL — ABNORMAL HIGH (ref 4.0–10.5)
nRBC: 0 % (ref 0.0–0.2)

## 2023-04-19 MED ORDER — GUAIFENESIN ER 600 MG PO TB12
600.0000 mg | ORAL_TABLET | Freq: Two times a day (BID) | ORAL | Status: DC
  Administered 2023-04-19 – 2023-04-21 (×5): 600 mg via ORAL
  Filled 2023-04-19 (×5): qty 1

## 2023-04-19 MED ORDER — DM-GUAIFENESIN ER 30-600 MG PO TB12
1.0000 | ORAL_TABLET | Freq: Two times a day (BID) | ORAL | Status: DC
  Administered 2023-04-19: 1 via ORAL
  Filled 2023-04-19: qty 1

## 2023-04-19 MED ORDER — BENZONATATE 100 MG PO CAPS: 100.0000 mg | ORAL_CAPSULE | Freq: Three times a day (TID) | ORAL | Status: DC | PRN

## 2023-04-19 MED ORDER — SODIUM CHLORIDE 3 % IN NEBU
4.0000 mL | INHALATION_SOLUTION | Freq: Every day | RESPIRATORY_TRACT | Status: AC
  Administered 2023-04-19 – 2023-04-21 (×3): 4 mL via RESPIRATORY_TRACT
  Filled 2023-04-19 (×4): qty 4

## 2023-04-19 MED ORDER — OXCARBAZEPINE 150 MG PO TABS
150.0000 mg | ORAL_TABLET | Freq: Three times a day (TID) | ORAL | Status: DC
  Administered 2023-04-19 – 2023-04-21 (×6): 150 mg via ORAL
  Filled 2023-04-19 (×8): qty 1

## 2023-04-19 MED ORDER — GERHARDT'S BUTT CREAM
TOPICAL_CREAM | Freq: Three times a day (TID) | CUTANEOUS | Status: DC
  Filled 2023-04-19: qty 1

## 2023-04-19 MED ORDER — IPRATROPIUM-ALBUTEROL 0.5-2.5 (3) MG/3ML IN SOLN
3.0000 mL | Freq: Four times a day (QID) | RESPIRATORY_TRACT | Status: DC
  Administered 2023-04-19 – 2023-04-21 (×8): 3 mL via RESPIRATORY_TRACT
  Filled 2023-04-19 (×8): qty 3

## 2023-04-19 MED ORDER — ALBUTEROL SULFATE (2.5 MG/3ML) 0.083% IN NEBU: 2.5000 mg | INHALATION_SOLUTION | RESPIRATORY_TRACT | Status: DC | PRN

## 2023-04-19 MED ORDER — OXYMETAZOLINE HCL 0.05 % NA SOLN
1.0000 | Freq: Two times a day (BID) | NASAL | Status: DC
  Administered 2023-04-19 – 2023-04-21 (×3): 1 via NASAL
  Filled 2023-04-19 (×2): qty 30

## 2023-04-19 MED ORDER — SODIUM CHLORIDE 0.9 % IV SOLN: INTRAVENOUS | Status: DC

## 2023-04-19 MED ORDER — DANTROLENE SODIUM 25 MG PO CAPS
50.0000 mg | ORAL_CAPSULE | Freq: Four times a day (QID) | ORAL | Status: DC | PRN
  Administered 2023-04-19 – 2023-04-20 (×2): 50 mg via ORAL
  Filled 2023-04-19 (×3): qty 2

## 2023-04-19 MED ORDER — BACLOFEN 10 MG PO TABS
5.0000 mg | ORAL_TABLET | Freq: Three times a day (TID) | ORAL | Status: DC | PRN
  Administered 2023-04-19: 5 mg via ORAL
  Filled 2023-04-19: qty 1

## 2023-04-19 MED ORDER — FLUOXETINE HCL 20 MG PO CAPS
20.0000 mg | ORAL_CAPSULE | Freq: Three times a day (TID) | ORAL | Status: DC
  Administered 2023-04-19 – 2023-04-21 (×6): 20 mg via ORAL
  Filled 2023-04-19 (×6): qty 1

## 2023-04-19 MED ORDER — FLUTICASONE PROPIONATE 50 MCG/ACT NA SUSP
2.0000 | Freq: Every day | NASAL | Status: DC
  Administered 2023-04-20 – 2023-04-21 (×2): 2 via NASAL
  Filled 2023-04-19: qty 16

## 2023-04-19 NOTE — Plan of Care (Signed)

## 2023-04-19 NOTE — Consult Note (Signed)
WOC Nurse Consult Note: Reason for Consult: superficial abrasions to sacrum/buttocks  Wound type: Moisture Associated Skin Damage  ICD-10 CM Codes for Irritant Dermatitis L24A0 - Due to friction or contact with body fluids; unspecified L24A2 - Due to fecal, urinary or dual incontinence Pressure Injury POA: NA  Measurement: widespread erythema with scattered areas of partial thickness skin loss to sacrum/coccyx and bilateral buttocks as well as onto perineum and inner thighs associated with moisture and friction; question some fungal component    Drainage (amount, consistency, odor) none  Periwound: intact, patient is incontinent of urine and stool, wearing adult brief at time of visit  Dressing procedure/placement/frequency: Cleanse sacrum/coccyx, bilateral buttocks and inner thighs with soap and water, dry and apply Gerhardt's Butt Cream to area 3 times a day and prn soiling.   POC discussed with patient, caregiver and primary nurse. WOC team will not follow at this time. Re-consult if further needs arise.   Thank you,    Priscella Mann MSN, RN-BC, [employer] [phone]

## 2023-04-19 NOTE — Progress Notes (Signed)
PROGRESS NOTE  Vanessa Short  MWN:[id number] DOB: [birth year] DOA: 04/17/2023 PCP: Richmond Campbell., [name]   Brief Narrative: Patient is a 57 year old female with history of multiple sclerosis with spastic quadriparesis, ulcerative colitis, CKD stage III, hypertension, hyperlipidemia, OSA not on CPAP who presented with lethargy, somnolence from home.  Also reported cough, congestion, fever.  She was recently tested for COVID and she took a course of molnupiravir which she completed.  Was also on antibiotic for persistent cough, congestion.  Patient was somnolent on arrival.  Hemodynamically stable on presentation.  Potassium 3.2 creatinine of, creatinine 1.7 on presentation.  Urinalysis was suspicious for UTI.  CT head negative for acute findings.  Patient was started on IV fluids, started on antibiotics.  Mentation significantly improved on 9/4 but looks apathic,slightly confused  today  Assessment & Plan:  Principal Problem:   Acute kidney injury superimposed on chronic kidney disease (HCC) Active Problems:   Acute encephalopathy   Obstructive sleep apnea   Multiple sclerosis (HCC)   Spastic quadriparesis (HCC)   Normocytic anemia   Hypertension   Anxiety and depression   Dyslipidemia   AKI in CKD stage IIIa: Baseline creatinine ranged from 1-1.1.  Presented with creatinine in the range of 1.7.  Improved,continue gentle iv fluids  Acute encephalopathy/somnolence: Multifactorial, could be from AKI, or polypharmacy.  Chest x-ray negative for pneumonia.  CT head negative for intracranial abnormality.  Also takes baclofen, dantrolene, Seroquel, Prozac at home.These meds have been resumed  Congestion: Continue Flonase.  This could be from the history of recent COVID.  Continue Mucinex.  Family requesting for chest physiotherapy.  Currently she is on room air  Suspected UTI/leukocytosis: Lactate level normal.  F  Denies any dysuria but she may be insensitive due to her history of multiple  sclerosis.  No abdominal tenderness.Urine culture did not show any growth,abx d/ced  Hypokalemia: Supplemented and corrected  Normocytic anemia: Current hemoglobin stable,likely associated with chronic medical conditions  Multiple sclerosis/spastic quadriparesis: Wheelchair-bound at baseline.  Takes baclofen, dantrolene, Trileptal at home.  We recommend follow-up with neurology as an outpatient  Hypertension: Continue monitoring  blood pressure,takes labetelol at home,now on hold  Hyperlipidemia: Continue rosuvastatin  History of ulcerative colitis: On mesalamine, Amitiza  Anxiety/depression: On Seroquel and Prozac at home  Elevated liver enzymes: AST/ALT jumped to the range of 100s but normalized now  Sleep apnea: Does not use CPAP.Marland Kitchen Awaiting in lab titration for home BiPAP      DVT prophylaxis:heparin injection 5,000 Units Start: 04/17/23 2200     Code Status: Full Code  Family Communication: Discussed with husband at bedside on 9/5  Patient status:Obs  Patient is from :Home  Anticipated discharge QI:HKVQ  Estimated DC date: Tomorrow   Consultants: None  Procedures:None  Antimicrobials:  Anti-infectives (From admission, onward)    Start     Dose/Rate Route Frequency Ordered Stop   04/18/23 1000  cefTRIAXone (ROCEPHIN) 1 g in sodium chloride 0.9 % 100 mL IVPB  Status:  Discontinued        1 g 200 mL/hr over 30 Minutes Intravenous Every 24 hours 04/17/23 2005 04/19/23 1135   04/17/23 1900  cefTRIAXone (ROCEPHIN) 1 g in sodium chloride 0.9 % 100 mL IVPB        1 g 200 mL/hr over 30 Minutes Intravenous  Once 04/17/23 1855 04/17/23 1947       Subjective: Patient seen and examined at bedside today.  Lying in bed.  Her mentation has significantly improved and  she is currently alert and oriented but she looks apathic and depressed today.  She could not explain what is wrong with her.  She appears little congested.  Family requested for chest radiotherapy.  She is on  room air  Objective: Vitals:   04/18/23 2006 04/19/23 0500 04/19/23 0553 04/19/23 0838  BP: (!) 150/68  128/67 (!) 142/77  Pulse: 95  90 84  Resp:   18   Temp: 98.2 F (36.8 C)  98.4 F (36.9 C) 98.2 F (36.8 C)  TempSrc: Oral  Oral Oral  SpO2: 97%  99% 100%  Weight:  86.2 kg 86.8 kg     Intake/Output Summary (Last 24 hours) at 04/19/2023 1345 Last data filed at 04/19/2023 1147 Gross per 24 hour  Intake 261.24 ml  Output 350 ml  Net -88.76 ml   Filed Weights   04/18/23 0500 04/19/23 0500 04/19/23 0553  Weight: 86.2 kg 86.2 kg 86.8 kg    Examination:  General exam: Overall comfortable, not in distress, obese, flat affect HEENT: PERRL Respiratory system:  no wheezes or crackles  Cardiovascular system: S1 & S2 heard, RRR.  Gastrointestinal system: Abdomen is nondistended, soft and nontender. Central nervous system: Alert and oriented Extremities: No edema, no clubbing ,no cyanosis, spastic quadriparesis Skin: No rashes, no ulcers,no icterus     Data Reviewed: I have personally reviewed following labs and imaging studies  CBC: Recent Labs  Lab 04/17/23 1201 04/18/23 0852 04/19/23 0420  WBC 11.3* 14.6* 10.6*  NEUTROABS 8.2*  --   --   HGB 9.6* 9.2* 8.2*  HCT 31.7* 28.4* 27.3*  MCV 92.7 91.6 94.8  PLT 263 209 245   Basic Metabolic Panel: Recent Labs  Lab 04/17/23 1201 04/18/23 0852 04/19/23 0420  NA 143 136 143  K 3.2* 4.2 3.6  CL 104 98 113*  CO2 22 25 21*  GLUCOSE 140* 95 114*  BUN 38* 12 23*  CREATININE 1.76* 0.85 1.41*  CALCIUM 9.2 8.9 8.9     Recent Results (from the past 240 hour(s))  Culture, blood (routine x 2)     Status: None (Preliminary result)   Collection Time: 04/17/23  2:39 PM   Specimen: BLOOD  Result Value Ref Range Status   Specimen Description BLOOD RIGHT ANTECUBITAL  Final   Special Requests   Final    BOTTLES DRAWN AEROBIC AND ANAEROBIC Blood Culture results may not be optimal due to an excessive volume of blood received in  culture bottles   Culture   Final    NO GROWTH 2 DAYS Performed at [facility], [address], [place], [place] [address]    Report Status PENDING  Incomplete  Culture, blood (routine x 2)     Status: None (Preliminary result)   Collection Time: 04/17/23  2:45 PM   Specimen: BLOOD RIGHT FOREARM  Result Value Ref Range Status   Specimen Description BLOOD RIGHT FOREARM  Final   Special Requests   Final    BOTTLES DRAWN AEROBIC AND ANAEROBIC Blood Culture results may not be optimal due to an excessive volume of blood received in culture bottles   Culture   Final    NO GROWTH 2 DAYS Performed at [facility], [address] [place], [place] [address]    Report Status PENDING  Incomplete  Urine Culture     Status: None   Collection Time: 04/17/23  4:04 PM   Specimen: Urine, Clean Catch  Result Value Ref Range Status  Specimen Description URINE, CLEAN CATCH  Final   Special Requests NONE Reflexed from [id number]  Final   Culture   Final    NO GROWTH Performed at [facility], [address] [place], [place] [address]    Report Status 04/18/2023 FINAL  Final     Radiology Studies: CT Head Wo Contrast  Result Date: 04/17/2023 CLINICAL DATA:  Mental status change, unknown cause Lethargy. EXAM: CT HEAD WITHOUT CONTRAST TECHNIQUE: Contiguous axial images were obtained from the base of the skull through the vertex without intravenous contrast. RADIATION DOSE REDUCTION: This exam was performed according to the departmental dose-optimization program which includes automated exposure control, adjustment of the mA and/or kV according to patient size and/or use of iterative reconstruction technique. COMPARISON:  Brain MRI 01/31/2023 FINDINGS: Brain: Again seen generalized atrophy. Periventricular and deep white matter hypodensity, likely related to demyelination or mass compared with prior MRI. No hemorrhage, evidence of acute infarct, hydrocephalus or extra-axial collection.  Vascular: No hyperdense vessel or unexpected calcification. Skull: Normal. Negative for fracture or focal lesion. Sinuses/Orbits: Chronic mucosal thickening and opacification of the sphenoid sinus. No acute findings. No mastoid effusion. Other: None. IMPRESSION: 1. No acute intracranial abnormality. 2. Generalized atrophy. Periventricular and deep white matter hypodensity, likely related to demyelination when compared with prior MRI in the setting of multiple sclerosis. Electronically Signed   By: Narda Rutherford M.D.   On: 04/17/2023 18:46   DG Chest Port 1 View  Result Date: 04/17/2023 CLINICAL DATA:  Cough for 1 week. EXAM: PORTABLE CHEST 1 VIEW COMPARISON:  January 31, 2023. FINDINGS: Stable cardiomediastinal silhouette. Right internal jugular Port-A-Cath is unchanged. Lungs are clear. Bony thorax is unremarkable. IMPRESSION: No active disease. Electronically Signed   By: Lupita Raider M.D.   On: 04/17/2023 15:46    Scheduled Meds:  Chlorhexidine Gluconate Cloth  6 each Topical Daily   dextromethorphan-guaiFENesin  1 tablet Oral BID   FLUoxetine  20 mg Oral TID   fluticasone  1 spray Each Nare Daily   heparin  5,000 Units Subcutaneous Q8H   lubiprostone  16 mcg Oral Q supper   lubiprostone  8 mcg Oral Q breakfast   mesalamine  500 mg Oral TID   OXcarbazepine  150 mg Oral TID   rosuvastatin  20 mg Oral Daily   Continuous Infusions:  sodium chloride 75 mL/hr at 04/19/23 1147     LOS: 0 days   Burnadette Pop, MD [facility] P9/12/2022, 1:45 PM

## 2023-04-20 DIAGNOSIS — N179 Acute kidney failure, unspecified: Secondary | ICD-10-CM | POA: Diagnosis not present

## 2023-04-20 DIAGNOSIS — N189 Chronic kidney disease, unspecified: Secondary | ICD-10-CM | POA: Diagnosis not present

## 2023-04-20 LAB — BASIC METABOLIC PANEL
Anion gap: 12 (ref 5–15)
BUN: 21 mg/dL — ABNORMAL HIGH (ref 6–20)
CO2: 20 mmol/L — ABNORMAL LOW (ref 22–32)
Calcium: 8.7 mg/dL — ABNORMAL LOW (ref 8.9–10.3)
Chloride: 110 mmol/L (ref 98–111)
Creatinine, Ser: 1.17 mg/dL — ABNORMAL HIGH (ref 0.44–1.00)
GFR, Estimated: 54 mL/min — ABNORMAL LOW (ref >60)
Glucose, Bld: 148 mg/dL — ABNORMAL HIGH (ref 70–99)
Potassium: 3.4 mmol/L — ABNORMAL LOW (ref 3.5–5.1)
Sodium: 142 mmol/L (ref 135–145)

## 2023-04-20 LAB — GASTROINTESTINAL PANEL BY PCR, STOOL (REPLACES STOOL CULTURE)

## 2023-04-20 LAB — CBC
HCT: 27.3 % — ABNORMAL LOW (ref 36.0–46.0)
Hemoglobin: 8.3 g/dL — ABNORMAL LOW (ref 12.0–15.0)
MCH: 28.6 pg (ref 26.0–34.0)
MCHC: 30.4 g/dL (ref 30.0–36.0)
MCV: 94.1 fL (ref 80.0–100.0)
Platelets: 225 10*3/uL (ref 150–400)
RBC: 2.9 MIL/uL — ABNORMAL LOW (ref 3.87–5.11)
RDW: 15.5 % (ref 11.5–15.5)
WBC: 11.1 10*3/uL — ABNORMAL HIGH (ref 4.0–10.5)
nRBC: 0 % (ref 0.0–0.2)

## 2023-04-20 MED ORDER — IPRATROPIUM-ALBUTEROL 0.5-2.5 (3) MG/3ML IN SOLN
3.0000 mL | Freq: Four times a day (QID) | RESPIRATORY_TRACT | Status: DC | PRN
  Administered 2023-04-20: 3 mL via RESPIRATORY_TRACT
  Filled 2023-04-20: qty 3

## 2023-04-20 MED ORDER — POTASSIUM CHLORIDE CRYS ER 20 MEQ PO TBCR
40.0000 meq | EXTENDED_RELEASE_TABLET | Freq: Once | ORAL | Status: AC
  Administered 2023-04-20: 40 meq via ORAL
  Filled 2023-04-20: qty 2

## 2023-04-20 MED ORDER — LOPERAMIDE HCL 2 MG PO CAPS
2.0000 mg | ORAL_CAPSULE | ORAL | Status: DC | PRN
  Administered 2023-04-21: 2 mg via ORAL
  Filled 2023-04-20: qty 1

## 2023-04-20 NOTE — Progress Notes (Signed)
PROGRESS NOTE  Vanessa Short  ZOX:[id number] DOB: 11-25-1965 DOA: 04/17/2023 PCP: Richmond Campbell., [name]   Brief Narrative: Patient is a 57 year old female with history of multiple sclerosis with spastic quadriparesis, ulcerative colitis, CKD stage III, hypertension, hyperlipidemia, OSA not on CPAP who presented with lethargy, somnolence from home.  Also reported cough, congestion, fever.  She was recently tested for COVID and she took a course of molnupiravir which she completed.  Was also on antibiotic for persistent cough, congestion.  Patient was somnolent on arrival.  Hemodynamically stable on presentation.  Potassium 3.2 creatinine of, creatinine 1.7 on presentation.  Urinalysis was suspicious for UTI.  CT head negative for acute findings.  Patient was started on IV fluids, started on antibiotics.  Mentation significantly improved on 9/4.  Assessment & Plan:  Principal Problem:   Acute kidney injury superimposed on chronic kidney disease (HCC) Active Problems:   Acute encephalopathy   Obstructive sleep apnea   Multiple sclerosis (HCC)   Spastic quadriparesis (HCC)   Normocytic anemia   Hypertension   Anxiety and depression   Dyslipidemia   AKI in CKD stage IIIa: Baseline creatinine ranged from 1-1.1.  Presented with creatinine in the range of 1.7.  Improved,continue gentle iv fluids  Acute encephalopathy/somnolence: Multifactorial, could be from AKI, or polypharmacy.  Chest x-ray negative for pneumonia.  CT head negative for intracranial abnormality.  Also takes baclofen, dantrolene, Seroquel, Prozac at home.These meds have been resumed  Congestion: Continue Flonase.  This could be from the history of recent COVID.  Continue Mucinex.  Family requesting for chest physiotherapy.  Currently she is on room air.  Also ordered DuoNeb, hypertonic saline nebulization.  Chest x-ray on presentation did not show any acute findings  Diarrhea: Unclear etiology.  GI pathogen panel negative.   Continue Imodium.  Denies any abdominal pain, nausea or vomiting.  Could be part of ulcerative colitis  Suspected UTI/leukocytosis: Lactate level normal.  F  Denies any dysuria but she may be insensitive due to her history of multiple sclerosis.  No abdominal tenderness.Urine culture did not show any growth,abx d/ced  Hypokalemia: Supplemented and corrected  Normocytic anemia: Current hemoglobin stable,likely associated with chronic medical conditions  Multiple sclerosis/spastic quadriparesis: Wheelchair-bound at baseline.  Takes baclofen, dantrolene, Trileptal at home.  We recommend follow-up with neurology as an outpatient  Hypertension: Continue monitoring  blood pressure,takes labetelol at home,now on hold.  If blood pressure trends up, and can discontinue  Hyperlipidemia: Continue rosuvastatin  History of ulcerative colitis: On mesalamine, Amitiza  Anxiety/depression: On Seroquel and Prozac at home  Elevated liver enzymes: AST/ALT jumped to the range of 100s but normalized now  Sleep apnea: Does not use CPAP.Marland Kitchen Awaiting in lab titration for home BiPAP      DVT prophylaxis:heparin injection 5,000 Units Start: 04/17/23 2200     Code Status: Full Code  Family Communication: Discussed with husband at bedside on 9/6  Patient status:Obs  Patient is from :Home  Anticipated discharge WJ:XBJY  Estimated DC date: Tomorrow   Consultants: None  Procedures:None  Antimicrobials:  Anti-infectives (From admission, onward)    Start     Dose/Rate Route Frequency Ordered Stop   04/18/23 1000  cefTRIAXone (ROCEPHIN) 1 g in sodium chloride 0.9 % 100 mL IVPB  Status:  Discontinued        1 g 200 mL/hr over 30 Minutes Intravenous Every 24 hours 04/17/23 2005 04/19/23 1135   04/17/23 1900  cefTRIAXone (ROCEPHIN) 1 g in sodium chloride 0.9 % 100  mL IVPB        1 g 200 mL/hr over 30 Minutes Intravenous  Once 04/17/23 1855 04/17/23 1947       Subjective: Patient seen and examined  at bedside today.  Hemodynamically stable.  On room air.  She is alert oriented today.  She feels better.  Still having watery stools.  Denies any abdomen pain.  Discharge made to continue gentle IV fluids today and possible discharge home tomorrow if diarrhea improves  Objective: Vitals:   04/20/23 0500 04/20/23 0740 04/20/23 0801 04/20/23 0906  BP:  (!) 152/76    Pulse:  85    Resp:  18    Temp:  98.2 F (36.8 C)    TempSrc:  Oral    SpO2:  96% 96% 96%  Weight: 87.9 kg       Intake/Output Summary (Last 24 hours) at 04/20/2023 1136 Last data filed at 04/19/2023 1147 Gross per 24 hour  Intake 161.24 ml  Output --  Net 161.24 ml   Filed Weights   04/19/23 0500 04/19/23 0553 04/20/23 0500  Weight: 86.2 kg 86.8 kg 87.9 kg    Examination:   General exam: Overall comfortable, not in distress,obese HEENT: PERRL Respiratory system:  no wheezes or crackles  Cardiovascular system: S1 & S2 heard, RRR.  Gastrointestinal system: Abdomen is nondistended, soft and nontender. Central nervous system: Alert and oriented Extremities: No edema, no clubbing ,no cyanosis, spastic cord paresis Skin: No rashes, no ulcers,no icterus     Data Reviewed: I have personally reviewed following labs and imaging studies  CBC: Recent Labs  Lab 04/17/23 1201 04/18/23 0852 04/19/23 0420 04/20/23 0310  WBC 11.3* 14.6* 10.6* 11.1*  NEUTROABS 8.2*  --   --   --   HGB 9.6* 9.2* 8.2* 8.3*  HCT 31.7* 28.4* 27.3* 27.3*  MCV 92.7 91.6 94.8 94.1  PLT 263 209 245 225   Basic Metabolic Panel: Recent Labs  Lab 04/17/23 1201 04/18/23 0852 04/19/23 0420 04/20/23 0310  NA 143 136 143 142  K 3.2* 4.2 3.6 3.4*  CL 104 98 113* 110  CO2 22 25 21* 20*  GLUCOSE 140* 95 114* 148*  BUN 38* 12 23* 21*  CREATININE 1.76* 0.85 1.41* 1.17*  CALCIUM 9.2 8.9 8.9 8.7*     Recent Results (from the past 240 hour(s))  Culture, blood (routine x 2)     Status: None (Preliminary result)   Collection Time: 04/17/23   2:39 PM   Specimen: BLOOD  Result Value Ref Range Status   Specimen Description BLOOD RIGHT ANTECUBITAL  Final   Special Requests   Final    BOTTLES DRAWN AEROBIC AND ANAEROBIC Blood Culture results may not be optimal due to an excessive volume of blood received in culture bottles   Culture   Final    NO GROWTH 3 DAYS Performed at [facility], [address] [place], [place] [address]    Report Status PENDING  Incomplete  Culture, blood (routine x 2)     Status: None (Preliminary result)   Collection Time: 04/17/23  2:45 PM   Specimen: BLOOD RIGHT FOREARM  Result Value Ref Range Status   Specimen Description BLOOD RIGHT FOREARM  Final   Special Requests   Final    BOTTLES DRAWN AEROBIC AND ANAEROBIC Blood Culture results may not be optimal due to an excessive volume of blood received in culture bottles   Culture   Final    NO GROWTH 3 DAYS  Performed at [facility], [address] [place], [place] [address]    Report Status PENDING  Incomplete  Urine Culture     Status: None   Collection Time: 04/17/23  4:04 PM   Specimen: Urine, Clean Catch  Result Value Ref Range Status   Specimen Description URINE, CLEAN CATCH  Final   Special Requests NONE Reflexed from [id number]  Final   Culture   Final    NO GROWTH Performed at [facility], [address], [place], [place] [address]    Report Status 04/18/2023 FINAL  Final  Gastrointestinal Panel by PCR , Stool     Status: None   Collection Time: 04/19/23  6:15 PM   Specimen: Stool  Result Value Ref Range Status   Campylobacter species NOT DETECTED NOT DETECTED Final   Plesimonas shigelloides NOT DETECTED NOT DETECTED Final   Salmonella species NOT DETECTED NOT DETECTED Final   Yersinia enterocolitica NOT DETECTED NOT DETECTED Final   Vibrio species NOT DETECTED NOT DETECTED Final   Vibrio cholerae NOT DETECTED NOT DETECTED Final   Enteroaggregative E coli (EAEC) NOT DETECTED NOT DETECTED Final   Enteropathogenic E  coli (EPEC) NOT DETECTED NOT DETECTED Final   Enterotoxigenic E coli (ETEC) NOT DETECTED NOT DETECTED Final   Shiga like toxin producing E coli (STEC) NOT DETECTED NOT DETECTED Final   Shigella/Enteroinvasive E coli (EIEC) NOT DETECTED NOT DETECTED Final   Cryptosporidium NOT DETECTED NOT DETECTED Final   Cyclospora cayetanensis NOT DETECTED NOT DETECTED Final   Entamoeba histolytica NOT DETECTED NOT DETECTED Final   Giardia lamblia NOT DETECTED NOT DETECTED Final   Adenovirus F40/41 NOT DETECTED NOT DETECTED Final   Astrovirus NOT DETECTED NOT DETECTED Final   Norovirus GI/GII NOT DETECTED NOT DETECTED Final   Rotavirus A NOT DETECTED NOT DETECTED Final   Sapovirus (I, II, IV, and V) NOT DETECTED NOT DETECTED Final    Comment: Performed at [facility], [address] [place], [place] [address]     Radiology Studies: No results found.  Scheduled Meds:  Chlorhexidine Gluconate Cloth  6 each Topical Daily   FLUoxetine  20 mg Oral TID   fluticasone  2 spray Each Nare Daily   Gerhardt's butt cream   Topical TID   guaiFENesin  600 mg Oral BID   heparin  5,000 Units Subcutaneous Q8H   ipratropium-albuterol  3 mL Nebulization Q6H   lubiprostone  16 mcg Oral Q supper   lubiprostone  8 mcg Oral Q breakfast   mesalamine  500 mg Oral TID   OXcarbazepine  150 mg Oral TID   oxymetazoline  1 spray Each Nare BID   rosuvastatin  20 mg Oral Daily   sodium chloride HYPERTONIC  4 mL Nebulization Daily   Continuous Infusions:  sodium chloride 100 mL/hr at 04/20/23 0303     LOS: 1 day   Burnadette Pop, MD [facility] P9/01/2023, 11:36 AM

## 2023-04-20 NOTE — Plan of Care (Signed)
  Problem: Education: Goal: Knowledge of General Education information will improve Description: Including pain rating scale, medication(s)/side effects and non-pharmacologic comfort measures Outcome: Progressing   Problem: Health Behavior/Discharge Planning: Goal: Ability to manage health-related needs will improve Outcome: Progressing   Problem: Clinical Measurements: Goal: Ability to maintain clinical measurements within normal limits will improve Outcome: Progressing Goal: Respiratory complications will improve Outcome: Progressing Goal: Cardiovascular complication will be avoided Outcome: Progressing   Problem: Activity: Goal: Risk for activity intolerance will decrease Outcome: Progressing   Problem: Nutrition: Goal: Adequate nutrition will be maintained Outcome: Progressing   Problem: Elimination: Goal: Will not experience complications related to bowel motility Outcome: Progressing Goal: Will not experience complications related to urinary retention Outcome: Progressing

## 2023-04-21 ENCOUNTER — Inpatient Hospital Stay (HOSPITAL_COMMUNITY): Payer: Medicare Other

## 2023-04-21 DIAGNOSIS — N179 Acute kidney failure, unspecified: Secondary | ICD-10-CM | POA: Diagnosis not present

## 2023-04-21 DIAGNOSIS — N189 Chronic kidney disease, unspecified: Secondary | ICD-10-CM | POA: Diagnosis not present

## 2023-04-21 LAB — BASIC METABOLIC PANEL
Anion gap: 9 (ref 5–15)
BUN: 11 mg/dL (ref 6–20)
CO2: 19 mmol/L — ABNORMAL LOW (ref 22–32)
Calcium: 8.2 mg/dL — ABNORMAL LOW (ref 8.9–10.3)
Chloride: 111 mmol/L (ref 98–111)
Creatinine, Ser: 1.04 mg/dL — ABNORMAL HIGH (ref 0.44–1.00)
GFR, Estimated: >60 mL/min (ref >60)
Glucose, Bld: 118 mg/dL — ABNORMAL HIGH (ref 70–99)
Potassium: 3.4 mmol/L — ABNORMAL LOW (ref 3.5–5.1)
Sodium: 139 mmol/L (ref 135–145)

## 2023-04-21 MED ORDER — POTASSIUM CHLORIDE ER 10 MEQ PO TBCR: 20.0000 meq | EXTENDED_RELEASE_TABLET | Freq: Every day | ORAL | 0 refills | Status: AC

## 2023-04-21 MED ORDER — TERIFLUNOMIDE 14 MG PO TABS: 1.0000 | ORAL_TABLET | Freq: Every day | ORAL | Status: DC

## 2023-04-21 MED ORDER — DANTROLENE SODIUM 50 MG PO CAPS: 50.0000 mg | ORAL_CAPSULE | Freq: Three times a day (TID) | ORAL | Status: DC | PRN

## 2023-04-21 MED ORDER — LOPERAMIDE HCL 2 MG PO CAPS: 2.0000 mg | ORAL_CAPSULE | ORAL | 0 refills | Status: DC | PRN

## 2023-04-21 MED ORDER — HEPARIN SOD (PORK) LOCK FLUSH 100 UNIT/ML IV SOLN
500.0000 [IU] | INTRAVENOUS | Status: AC | PRN
  Administered 2023-04-21: 500 [IU]

## 2023-04-21 MED ORDER — GUAIFENESIN ER 600 MG PO TB12: 1200.0000 mg | ORAL_TABLET | Freq: Two times a day (BID) | ORAL | 0 refills | Status: DC

## 2023-04-21 MED ORDER — POTASSIUM CHLORIDE CRYS ER 20 MEQ PO TBCR
40.0000 meq | EXTENDED_RELEASE_TABLET | Freq: Once | ORAL | Status: AC
  Administered 2023-04-21: 40 meq via ORAL
  Filled 2023-04-21: qty 2

## 2023-04-21 NOTE — Progress Notes (Signed)
CPT not done at this time. Patient discharged.

## 2023-04-21 NOTE — Plan of Care (Signed)

## 2023-04-21 NOTE — Progress Notes (Signed)
PT is in a calm stable condition left in care of husband and home care nurse. All Monitors and Iv's have been removed. Her port was de accessed. Pt is discharged to go home with husband as transport. Discharge instructions have been given. All questions have been answered.

## 2023-04-21 NOTE — Discharge Summary (Addendum)
Physician Discharge Summary  Vanessa Short ZYS:[id number] DOB: 1966-04-02 DOA: 04/17/2023  PCP: Richmond Campbell., [name]  Admit date: 04/17/2023 Discharge date: 04/21/2023  Admitted From: Home Disposition:  Home  Discharge Condition:Stable CODE STATUS:FULL Diet recommendation: Regular   Brief/Interim Summary: Patient is a 57 year old female with history of multiple sclerosis with spastic quadriparesis, ulcerative colitis, CKD stage III, hypertension, hyperlipidemia, OSA not on CPAP who presented with lethargy, somnolence from home.  Also reported cough, congestion, fever.  She was recently tested for COVID and she took a course of molnupiravir which she completed.  Was also on antibiotic for persistent cough, congestion.  Patient was somnolent on arrival.  Hemodynamically stable on presentation.  Potassium 3.2 creatinine of, creatinine 1.7 on presentation.  Urinalysis was suspicious for UTI.  CT head negative for acute findings.  Patient was started on IV fluids, started on antibiotics.  Mentation significantly improved on 9/4.  Urine culture did not show any growth, antibiotics discontinued.  Medically stable for discharge home today.  Following problems were addressed during the hospitalization: AKI in CKD stage IIIa: Baseline creatinine ranged from 1-1.1.  Presented with creatinine in the range of 1.7.  Improved,now back to baseline   Acute encephalopathy/somnolence: Multifactorial, could be from AKI, or polypharmacy.  Chest x-ray negative for pneumonia.  CT head negative for intracranial abnormality.  Also takes baclofen, dantrolene, Seroquel, Prozac at home.These meds have been resumed   Congestion: his could be from the history of recent COVID.  Continue Mucinex.  Family requested for chest physiotherapy.  Currently she is on room air. Chest x-ray doesnot show any acute findings   Diarrhea: Unclear etiology.  GI pathogen panel negative.  Continue Imodium.  Denies any abdominal pain, nausea  or vomiting.  Could be part of ulcerative colitis,now improved   Suspected UTI/leukocytosis: Lactate level normal.No abdominal tenderness.Urine culture did not show any growth,abx d/ced   Hypokalemia: Supplemented    Normocytic anemia: Current hemoglobin stable,likely associated with chronic medical conditions   Multiple sclerosis/spastic quadriparesis: Wheelchair-bound at baseline.  Takes baclofen, dantrolene, Trileptal at home.  We recommend follow-up with neurology as an outpatient   Hypertension: Continue monitoring  blood pressure,takes labetelol at home   Hyperlipidemia: Continue rosuvastatin   History of ulcerative colitis: On mesalamine, Amitiza   Anxiety/depression: On Seroquel and Prozac at home   Elevated liver enzymes: AST/ALT jumped to the range of 100s but normalized now   Sleep apnea: Does not use CPAP.Marland Kitchen Awaiting in lab titration for home BiPAP  Discharge Diagnoses:  Principal Problem:   Acute kidney injury superimposed on chronic kidney disease (HCC) Active Problems:   Acute encephalopathy   Obstructive sleep apnea   Multiple sclerosis (HCC)   Spastic quadriparesis (HCC)   Normocytic anemia   Hypertension   Anxiety and depression   Dyslipidemia    Discharge Instructions  Discharge Instructions     Diet general   Complete by: As directed    Discharge instructions   Complete by: As directed    1)Please take prescribed  medications as instructed 2)Follow up with your PCP in a week 3)Follow up with your neurologist as an outpatient   Discharge wound care:   Complete by: As directed    As per wound care nurse   Increase activity slowly   Complete by: As directed       Allergies as of 04/21/2023       Reactions   Nitrofurantoin Other (See Comments)   syncope   Oxycodone-acetaminophen Other (See Comments)  Risperidone And Related Other (See Comments)   Became very lethargic   Penicillins Rash        Medication List     STOP taking these  medications    cefdinir 300 MG capsule Commonly known as: OMNICEF   doxycycline 100 MG capsule Commonly known as: VIBRAMYCIN       TAKE these medications    acyclovir 400 MG tablet Commonly known as: ZOVIRAX Take 400 mg by mouth daily. Take every day per patient   aspirin EC 81 MG tablet Take 81 mg by mouth daily.   b complex vitamins tablet Take 1 tablet by mouth daily.   baclofen 10 MG tablet Commonly known as: LIORESAL 1 to 2 po qid up to 8/day   cholecalciferol 1000 units tablet Commonly known as: VITAMIN D Take 1,000 Units by mouth daily.   dantrolene 50 MG capsule Commonly known as: DANTRIUM Take 1 capsule (50 mg total) by mouth 3 (three) times daily as needed. What changed:  when to take this reasons to take this   famotidine 40 MG tablet Commonly known as: PEPCID Take 40 mg by mouth 2 (two) times daily. What changed: Another medication with the same name was removed. Continue taking this medication, and follow the directions you see here.   FLUoxetine 20 MG capsule Commonly known as: PROZAC Take 20 mg by mouth 3 (three) times daily.   folic acid 400 MCG tablet Commonly known as: FOLVITE Take 800 mcg by mouth daily.   furosemide 40 MG tablet Commonly known as: LASIX Take 1 tablet (40 mg total) by mouth 2 (two) times daily.   guaiFENesin 600 MG 12 hr tablet Commonly known as: MUCINEX Take 2 tablets (1,200 mg total) by mouth 2 (two) times daily for 5 days.   labetalol 100 MG tablet Commonly known as: NORMODYNE TAKE 1/2 TABLET BY MOUTH DAILY   Lagevrio 200 MG Caps capsule Generic drug: molnupiravir EUA Take 4 capsules by mouth 2 (two) times daily.   lidocaine-prilocaine cream Commonly known as: EMLA Apply one inch before port-a cath access prn What changed:  how much to take how to take this when to take this reasons to take this additional instructions   loperamide 2 MG capsule Commonly known as: IMODIUM Take 1 capsule (2 mg total)  by mouth every 4 (four) hours as needed for diarrhea or loose stools.   lubiprostone 8 MCG capsule Commonly known as: AMITIZA Take 8-16 mcg by mouth 2 (two) times daily with a meal. Take  in the morning and 16 mcg in the afternoon.   mesalamine 0.375 g 24 hr capsule Commonly known as: APRISO Take 375 mg by mouth QID.   multivitamin tablet Take 1 tablet by mouth daily.   mupirocin ointment 2 % Commonly known as: BACTROBAN Apply 1 Application topically 2 (two) times daily.   OXcarbazepine 150 MG tablet Commonly known as: TRILEPTAL Take 1 tablet (150 mg total) by mouth 3 (three) times daily.   potassium chloride 10 MEQ tablet Commonly known as: KLOR-CON Take 2 tablets (20 mEq total) by mouth daily. Take 4 pills daily for 3 days then continue taking as before Start taking on: April 22, 2023 What changed: additional instructions   QUEtiapine 25 MG tablet Commonly known as: SEROQUEL One po qHS and one po every day prn   rosuvastatin 20 MG tablet Commonly known as: CRESTOR Take 20 mg by mouth daily. What changed: Another medication with the same name was removed. Continue taking this  medication, and follow the directions you see here.   sodium chloride 0.65 % Soln nasal spray Commonly known as: OCEAN Place 1 spray into both nostrils as needed for congestion. What changed: when to take this   Teriflunomide 14 MG Tabs Take 1 tablet (14 mg total) by mouth daily. HOLD until cleared by neurology outpatient to resume. What changed: additional instructions   tolterodine 2 MG tablet Commonly known as: DETROL Take 2 mg by mouth 2 (two) times daily.               Discharge Care Instructions  (From admission, onward)           Start     Ordered   04/21/23 0000  Discharge wound care:       Comments: As per wound care nurse   04/21/23 1037            Follow-up Information     Richmond Campbell., [name]. Schedule an appointment as soon as possible for a  visit in 1 week(s).   Specialty: Family Medicine Contact information: [address] [place] [address] [phone]                Allergies  Allergen Reactions   Nitrofurantoin Other (See Comments)    syncope   Oxycodone-Acetaminophen Other (See Comments)   Risperidone And Related Other (See Comments)    Became very lethargic   Penicillins Rash    Consultations: None   Procedures/Studies: DG CHEST PORT 1 VIEW  Result Date: 04/21/2023 CLINICAL DATA:  57 year old female with history of shortness of breath. EXAM: PORTABLE CHEST 1 VIEW COMPARISON:  Chest x-ray 04/17/2023. FINDINGS: Right internal jugular single-lumen porta cath with tip terminating in the distal superior vena cava. Lung volumes are low. Opacity in the periphery of the left lung base, similar to prior examination, likely to reflect chronic pleuroparenchymal scarring. Possibility of a trace left pleural effusion is not excluded. Right lung is clear. No right pleural effusion. No pneumothorax. No evidence of pulmonary edema. Heart size is normal. Upper mediastinal contours are within normal limits allowing for patient positioning. Atherosclerotic calcifications are noted in the thoracic aorta. IMPRESSION: 1. Similar radiographic appearance the chest with the prior study, with probable chronic scarring in the left lung base. 2. Aortic atherosclerosis. Electronically Signed   By: Trudie Reed M.D.   On: 04/21/2023 09:56   CT Head Wo Contrast  Result Date: 04/17/2023 CLINICAL DATA:  Mental status change, unknown cause Lethargy. EXAM: CT HEAD WITHOUT CONTRAST TECHNIQUE: Contiguous axial images were obtained from the base of the skull through the vertex without intravenous contrast. RADIATION DOSE REDUCTION: This exam was performed according to the departmental dose-optimization program which includes automated exposure control, adjustment of the mA and/or kV according to patient size and/or use of iterative  reconstruction technique. COMPARISON:  Brain MRI 01/31/2023 FINDINGS: Brain: Again seen generalized atrophy. Periventricular and deep white matter hypodensity, likely related to demyelination or mass compared with prior MRI. No hemorrhage, evidence of acute infarct, hydrocephalus or extra-axial collection. Vascular: No hyperdense vessel or unexpected calcification. Skull: Normal. Negative for fracture or focal lesion. Sinuses/Orbits: Chronic mucosal thickening and opacification of the sphenoid sinus. No acute findings. No mastoid effusion. Other: None. IMPRESSION: 1. No acute intracranial abnormality. 2. Generalized atrophy. Periventricular and deep white matter hypodensity, likely related to demyelination when compared with prior MRI in the setting of multiple sclerosis. Electronically Signed   By: Narda Rutherford M.D.   On: 04/17/2023 18:46  DG Chest Port 1 View  Result Date: 04/17/2023 CLINICAL DATA:  Cough for 1 week. EXAM: PORTABLE CHEST 1 VIEW COMPARISON:  January 31, 2023. FINDINGS: Stable cardiomediastinal silhouette. Right internal jugular Port-A-Cath is unchanged. Lungs are clear. Bony thorax is unremarkable. IMPRESSION: No active disease. Electronically Signed   By: Lupita Raider M.D.   On: 04/17/2023 15:46      Subjective: Patient seen and examined at bedside today.  Hemodynamically stable.  Looks comfortable.  On room air.  He still has some congestion.  Diarrhea slowed down.  Long discussion held at the bedside with husband and patient about discharge planning  Discharge Exam: Vitals:   04/21/23 0741 04/21/23 0755  BP: (!) 145/83   Pulse: 87 79  Resp: 18 18  Temp: 98.4 F (36.9 C)   SpO2: 100% 95%   Vitals:   04/21/23 0415 04/21/23 0500 04/21/23 0741 04/21/23 0755  BP: (!) 148/89  (!) 145/83   Pulse: 84  87 79  Resp: 18  18 18   Temp: 98 F (36.7 C)  98.4 F (36.9 C)   TempSrc: Oral  Oral   SpO2: 96%  100% 95%  Weight:  86.5 kg      General: Pt is alert, awake, not in  acute distress, obese Cardiovascular: RRR, S1/S2 +, no rubs, no gallops Respiratory: CTA bilaterally, no wheezing, no rhonchi Abdominal: Soft, NT, ND, bowel sounds + Extremities: no edema, no cyanosis, spastic quadriparesis    The results of significant diagnostics from this hospitalization (including imaging, microbiology, ancillary and laboratory) are listed below for reference.     Microbiology: Recent Results (from the past 240 hour(s))  Culture, blood (routine x 2)     Status: None (Preliminary result)   Collection Time: 04/17/23  2:39 PM   Specimen: BLOOD  Result Value Ref Range Status   Specimen Description BLOOD RIGHT ANTECUBITAL  Final   Special Requests   Final    BOTTLES DRAWN AEROBIC AND ANAEROBIC Blood Culture results may not be optimal due to an excessive volume of blood received in culture bottles   Culture   Final    NO GROWTH 4 DAYS Performed at [facility], [address], [place], [place] [address]    Report Status PENDING  Incomplete  Culture, blood (routine x 2)     Status: None (Preliminary result)   Collection Time: 04/17/23  2:45 PM   Specimen: BLOOD RIGHT FOREARM  Result Value Ref Range Status   Specimen Description BLOOD RIGHT FOREARM  Final   Special Requests   Final    BOTTLES DRAWN AEROBIC AND ANAEROBIC Blood Culture results may not be optimal due to an excessive volume of blood received in culture bottles   Culture   Final    NO GROWTH 4 DAYS Performed at [facility], [address], [place], [place] [address]    Report Status PENDING  Incomplete  Urine Culture     Status: None   Collection Time: 04/17/23  4:04 PM   Specimen: Urine, Clean Catch  Result Value Ref Range Status   Specimen Description URINE, CLEAN CATCH  Final   Special Requests NONE Reflexed from [phone]  Final   Culture   Final    NO GROWTH Performed at [facility], [address] [place], [place] [address]    Report Status 04/18/2023 FINAL  Final   Gastrointestinal Panel by PCR , Stool     Status: None   Collection  Time: 04/19/23  6:15 PM   Specimen: Stool  Result Value Ref Range Status   Campylobacter species NOT DETECTED NOT DETECTED Final   Plesimonas shigelloides NOT DETECTED NOT DETECTED Final   Salmonella species NOT DETECTED NOT DETECTED Final   Yersinia enterocolitica NOT DETECTED NOT DETECTED Final   Vibrio species NOT DETECTED NOT DETECTED Final   Vibrio cholerae NOT DETECTED NOT DETECTED Final   Enteroaggregative E coli (EAEC) NOT DETECTED NOT DETECTED Final   Enteropathogenic E coli (EPEC) NOT DETECTED NOT DETECTED Final   Enterotoxigenic E coli (ETEC) NOT DETECTED NOT DETECTED Final   Shiga like toxin producing E coli (STEC) NOT DETECTED NOT DETECTED Final   Shigella/Enteroinvasive E coli (EIEC) NOT DETECTED NOT DETECTED Final   Cryptosporidium NOT DETECTED NOT DETECTED Final   Cyclospora cayetanensis NOT DETECTED NOT DETECTED Final   Entamoeba histolytica NOT DETECTED NOT DETECTED Final   Giardia lamblia NOT DETECTED NOT DETECTED Final   Adenovirus F40/41 NOT DETECTED NOT DETECTED Final   Astrovirus NOT DETECTED NOT DETECTED Final   Norovirus GI/GII NOT DETECTED NOT DETECTED Final   Rotavirus A NOT DETECTED NOT DETECTED Final   Sapovirus (I, II, IV, and V) NOT DETECTED NOT DETECTED Final    Comment: Performed at [facility], [address] [place], [place] [address]     Labs: BNP (last 3 results) No results for input(s): "BNP" in the last 8760 hours. Basic Metabolic Panel: Recent Labs  Lab 04/17/23 1201 04/18/23 0852 04/19/23 0420 04/20/23 0310 04/21/23 0311  NA 143 136 143 142 139  K 3.2* 4.2 3.6 3.4* 3.4*  CL 104 98 113* 110 111  CO2 22 25 21* 20* 19*  GLUCOSE 140* 95 114* 148* 118*  BUN 38* 12 23* 21* 11  CREATININE 1.76* 0.85 1.41* 1.17* 1.04*  CALCIUM 9.2 8.9 8.9 8.7* 8.2*   Liver Function Tests: Recent Labs  Lab 04/17/23 1201 04/18/23 0852 04/19/23 0420  AST 30 157* 16  ALT  51* 144* 31  ALKPHOS 173* 49 150*  BILITOT 0.7 4.0* 0.7  PROT 6.7 7.0 5.7*  ALBUMIN 2.9* 3.6 2.5*   No results for input(s): "LIPASE", "AMYLASE" in the last 168 hours. No results for input(s): "AMMONIA" in the last 168 hours. CBC: Recent Labs  Lab 04/17/23 1201 04/18/23 0852 04/19/23 0420 04/20/23 0310  WBC 11.3* 14.6* 10.6* 11.1*  NEUTROABS 8.2*  --   --   --   HGB 9.6* 9.2* 8.2* 8.3*  HCT 31.7* 28.4* 27.3* 27.3*  MCV 92.7 91.6 94.8 94.1  PLT 263 209 245 225   Cardiac Enzymes: No results for input(s): "CKTOTAL", "CKMB", "CKMBINDEX", "TROPONINI" in the last 168 hours. BNP: Invalid input(s): "POCBNP" CBG: No results for input(s): "GLUCAP" in the last 168 hours. D-Dimer No results for input(s): "DDIMER" in the last 72 hours. Hgb A1c No results for input(s): "HGBA1C" in the last 72 hours. Lipid Profile No results for input(s): "CHOL", "HDL", "LDLCALC", "TRIG", "CHOLHDL", "LDLDIRECT" in the last 72 hours. Thyroid function studies No results for input(s): "TSH", "T4TOTAL", "T3FREE", "THYROIDAB" in the last 72 hours.  Invalid input(s): "FREET3" Anemia work up No results for input(s): "VITAMINB12", "FOLATE", "FERRITIN", "TIBC", "IRON", "RETICCTPCT" in the last 72 hours. Urinalysis    Component Value Date/Time   COLORURINE YELLOW 04/17/2023 1604   APPEARANCEUR CLOUDY (A) 04/17/2023 1604   LABSPEC 1.011 04/17/2023 1604   PHURINE 5.0 04/17/2023 1604   GLUCOSEU NEGATIVE 04/17/2023 1604   HGBUR LARGE (A) 04/17/2023 1604   BILIRUBINUR  NEGATIVE 04/17/2023 1604   KETONESUR NEGATIVE 04/17/2023 1604   PROTEINUR 30 (A) 04/17/2023 1604   UROBILINOGEN 0.2 05/31/2013 0237   NITRITE NEGATIVE 04/17/2023 1604   LEUKOCYTESUR LARGE (A) 04/17/2023 1604   Sepsis Labs Recent Labs  Lab 04/17/23 1201 04/18/23 0852 04/19/23 0420 04/20/23 0310  WBC 11.3* 14.6* 10.6* 11.1*   Microbiology Recent Results (from the past 240 hour(s))  Culture, blood (routine x 2)     Status: None  (Preliminary result)   Collection Time: 04/17/23  2:39 PM   Specimen: BLOOD  Result Value Ref Range Status   Specimen Description BLOOD RIGHT ANTECUBITAL  Final   Special Requests   Final    BOTTLES DRAWN AEROBIC AND ANAEROBIC Blood Culture results may not be optimal due to an excessive volume of blood received in culture bottles   Culture   Final    NO GROWTH 4 DAYS Performed at [facility], [address] [place], [place] [address]    Report Status PENDING  Incomplete  Culture, blood (routine x 2)     Status: None (Preliminary result)   Collection Time: 04/17/23  2:45 PM   Specimen: BLOOD RIGHT FOREARM  Result Value Ref Range Status   Specimen Description BLOOD RIGHT FOREARM  Final   Special Requests   Final    BOTTLES DRAWN AEROBIC AND ANAEROBIC Blood Culture results may not be optimal due to an excessive volume of blood received in culture bottles   Culture   Final    NO GROWTH 4 DAYS Performed at [facility], [address], [place], [place] [address]    Report Status PENDING  Incomplete  Urine Culture     Status: None   Collection Time: 04/17/23  4:04 PM   Specimen: Urine, Clean Catch  Result Value Ref Range Status   Specimen Description URINE, CLEAN CATCH  Final   Special Requests NONE Reflexed from [phone]  Final   Culture   Final    NO GROWTH Performed at [facility], [address] [place], [place] [address]    Report Status 04/18/2023 FINAL  Final  Gastrointestinal Panel by PCR , Stool     Status: None   Collection Time: 04/19/23  6:15 PM   Specimen: Stool  Result Value Ref Range Status   Campylobacter species NOT DETECTED NOT DETECTED Final   Plesimonas shigelloides NOT DETECTED NOT DETECTED Final   Salmonella species NOT DETECTED NOT DETECTED Final   Yersinia enterocolitica NOT DETECTED NOT DETECTED Final   Vibrio species NOT DETECTED NOT DETECTED Final   Vibrio cholerae NOT DETECTED NOT DETECTED Final   Enteroaggregative E coli (EAEC)  NOT DETECTED NOT DETECTED Final   Enteropathogenic E coli (EPEC) NOT DETECTED NOT DETECTED Final   Enterotoxigenic E coli (ETEC) NOT DETECTED NOT DETECTED Final   Shiga like toxin producing E coli (STEC) NOT DETECTED NOT DETECTED Final   Shigella/Enteroinvasive E coli (EIEC) NOT DETECTED NOT DETECTED Final   Cryptosporidium NOT DETECTED NOT DETECTED Final   Cyclospora cayetanensis NOT DETECTED NOT DETECTED Final   Entamoeba histolytica NOT DETECTED NOT DETECTED Final   Giardia lamblia NOT DETECTED NOT DETECTED Final   Adenovirus F40/41 NOT DETECTED NOT DETECTED Final   Astrovirus NOT DETECTED NOT DETECTED Final   Norovirus GI/GII NOT DETECTED NOT DETECTED Final   Rotavirus A NOT DETECTED NOT DETECTED Final   Sapovirus (I, II, IV, and V) NOT DETECTED NOT DETECTED Final    Comment: Performed at  [facility], [address] [place], [place] [address]    Please note: You were cared for by a hospitalist during your hospital stay. Once you are discharged, your primary care physician will handle any further medical issues. Please note that NO REFILLS for any discharge medications will be authorized once you are discharged, as it is imperative that you return to your primary care physician (or establish a relationship with a primary care physician if you do not have one) for your post hospital discharge needs so that they can reassess your need for medications and monitor your lab values.    Time coordinating discharge: 40 minutes  SIGNED:   Burnadette Pop, MD  [facility] 04/21/2023, 10:38 AM Pager [phone]  If 7PM-7AM, please contact night-coverage [email or web address] Password [email or web address]

## 2023-04-22 LAB — CULTURE, BLOOD (ROUTINE X 2)
Culture: NO GROWTH
Culture: NO GROWTH

## 2023-04-24 ENCOUNTER — Encounter: Payer: Self-pay | Admitting: Neurology

## 2023-04-24 ENCOUNTER — Emergency Department (HOSPITAL_COMMUNITY): Payer: Medicare Other

## 2023-04-24 ENCOUNTER — Other Ambulatory Visit: Payer: Self-pay

## 2023-04-24 ENCOUNTER — Observation Stay (HOSPITAL_COMMUNITY)
Admission: EM | Admit: 2023-04-24 | Discharge: 2023-04-25 | Disposition: A | Discharge status: Home / Self Care | Payer: Medicare Other

## 2023-04-24 DIAGNOSIS — Z6830 Body mass index (BMI) 30.0-30.9, adult: Secondary | ICD-10-CM | POA: Insufficient documentation

## 2023-04-24 DIAGNOSIS — E876 Hypokalemia: Secondary | ICD-10-CM | POA: Diagnosis present

## 2023-04-24 DIAGNOSIS — E669 Obesity, unspecified: Secondary | ICD-10-CM | POA: Diagnosis not present

## 2023-04-24 DIAGNOSIS — G9340 Encephalopathy, unspecified: Secondary | ICD-10-CM | POA: Diagnosis not present

## 2023-04-24 DIAGNOSIS — G4733 Obstructive sleep apnea (adult) (pediatric): Secondary | ICD-10-CM | POA: Diagnosis not present

## 2023-04-24 DIAGNOSIS — K5190 Ulcerative colitis, unspecified, without complications: Secondary | ICD-10-CM | POA: Diagnosis present

## 2023-04-24 DIAGNOSIS — Z79899 Other long term (current) drug therapy: Secondary | ICD-10-CM | POA: Diagnosis not present

## 2023-04-24 DIAGNOSIS — N1831 Chronic kidney disease, stage 3a: Secondary | ICD-10-CM | POA: Diagnosis not present

## 2023-04-24 DIAGNOSIS — R400 Somnolence: Principal | ICD-10-CM

## 2023-04-24 DIAGNOSIS — N179 Acute kidney failure, unspecified: Secondary | ICD-10-CM | POA: Diagnosis present

## 2023-04-24 DIAGNOSIS — I130 Hypertensive heart and chronic kidney disease with heart failure and stage 1 through stage 4 chronic kidney disease, or unspecified chronic kidney disease: Secondary | ICD-10-CM | POA: Insufficient documentation

## 2023-04-24 DIAGNOSIS — R4182 Altered mental status, unspecified: Secondary | ICD-10-CM | POA: Diagnosis present

## 2023-04-24 DIAGNOSIS — I5032 Chronic diastolic (congestive) heart failure: Secondary | ICD-10-CM | POA: Diagnosis not present

## 2023-04-24 DIAGNOSIS — Z7982 Long term (current) use of aspirin: Secondary | ICD-10-CM | POA: Diagnosis not present

## 2023-04-24 DIAGNOSIS — N189 Chronic kidney disease, unspecified: Secondary | ICD-10-CM | POA: Diagnosis present

## 2023-04-24 DIAGNOSIS — G35 Multiple sclerosis: Secondary | ICD-10-CM | POA: Diagnosis present

## 2023-04-24 DIAGNOSIS — Z993 Dependence on wheelchair: Secondary | ICD-10-CM

## 2023-04-24 LAB — COMPREHENSIVE METABOLIC PANEL
ALT: 35 U/L (ref 0–44)
AST: 24 U/L (ref 15–41)
Albumin: 3.1 g/dL — ABNORMAL LOW (ref 3.5–5.0)
Alkaline Phosphatase: 131 U/L — ABNORMAL HIGH (ref 38–126)
Anion gap: 10 (ref 5–15)
BUN: 19 mg/dL (ref 6–20)
CO2: 22 mmol/L (ref 22–32)
Calcium: 8.5 mg/dL — ABNORMAL LOW (ref 8.9–10.3)
Chloride: 109 mmol/L (ref 98–111)
Creatinine, Ser: 1.36 mg/dL — ABNORMAL HIGH (ref 0.44–1.00)
GFR, Estimated: 45 mL/min — ABNORMAL LOW (ref >60)
Glucose, Bld: 105 mg/dL — ABNORMAL HIGH (ref 70–99)
Potassium: 3.3 mmol/L — ABNORMAL LOW (ref 3.5–5.1)
Sodium: 141 mmol/L (ref 135–145)
Total Bilirubin: 0.4 mg/dL (ref 0.3–1.2)
Total Protein: 6.7 g/dL (ref 6.5–8.1)

## 2023-04-24 LAB — CBC WITH DIFFERENTIAL/PLATELET
Abs Immature Granulocytes: 0.06 10*3/uL (ref 0.00–0.07)
Basophils Absolute: 0.1 10*3/uL (ref 0.0–0.1)
Basophils Relative: 1 %
Eosinophils Absolute: 0.3 10*3/uL (ref 0.0–0.5)
Eosinophils Relative: 3 %
HCT: 30.8 % — ABNORMAL LOW (ref 36.0–46.0)
Hemoglobin: 9.2 g/dL — ABNORMAL LOW (ref 12.0–15.0)
Immature Granulocytes: 1 %
Lymphocytes Relative: 10 %
Lymphs Abs: 0.9 10*3/uL (ref 0.7–4.0)
MCH: 29.2 pg (ref 26.0–34.0)
MCHC: 29.9 g/dL — ABNORMAL LOW (ref 30.0–36.0)
MCV: 97.8 fL (ref 80.0–100.0)
Monocytes Absolute: 1 10*3/uL (ref 0.1–1.0)
Monocytes Relative: 11 %
Neutro Abs: 6.5 10*3/uL (ref 1.7–7.7)
Neutrophils Relative %: 74 %
Platelets: 167 10*3/uL (ref 150–400)
RBC: 3.15 MIL/uL — ABNORMAL LOW (ref 3.87–5.11)
RDW: 17.7 % — ABNORMAL HIGH (ref 11.5–15.5)
WBC: 8.7 10*3/uL (ref 4.0–10.5)
nRBC: 0 % (ref 0.0–0.2)

## 2023-04-24 LAB — URINALYSIS, ROUTINE W REFLEX MICROSCOPIC
Bilirubin Urine: NEGATIVE
Glucose, UA: NEGATIVE mg/dL
Ketones, ur: NEGATIVE mg/dL
Nitrite: NEGATIVE
Protein, ur: NEGATIVE mg/dL
RBC / HPF: >50 RBC/hpf (ref 0–5)
Specific Gravity, Urine: 1.01 (ref 1.005–1.030)
WBC, UA: >50 WBC/hpf (ref 0–5)
pH: 5 (ref 5.0–8.0)

## 2023-04-24 LAB — AMMONIA: Ammonia: 17 umol/L (ref 9–35)

## 2023-04-24 LAB — CBG MONITORING, ED: Glucose-Capillary: 102 mg/dL — ABNORMAL HIGH (ref 70–99)

## 2023-04-24 MED ORDER — POTASSIUM CHLORIDE CRYS ER 20 MEQ PO TBCR
20.0000 meq | EXTENDED_RELEASE_TABLET | Freq: Once | ORAL | Status: AC
  Administered 2023-04-24: 20 meq via ORAL
  Filled 2023-04-24: qty 1

## 2023-04-24 MED ORDER — SODIUM CHLORIDE 0.9 % IV BOLUS
1000.0000 mL | Freq: Once | INTRAVENOUS | Status: AC
  Administered 2023-04-24: 1000 mL via INTRAVENOUS

## 2023-04-24 NOTE — ED Notes (Signed)
Pt requested her port be accessed. She wanted to use her lidocaine to numb the site. Same was placed and she stated it takes about "30-45 minutes" to fully numb the site and she requested [place] to wait to draw the labs. Pt also stated she does not know when she has to urinate, and she is in a brief. Pts husband stated at home they use a PureWick. He stated the last time in the ED she had an in and out to get the urine sample.

## 2023-04-24 NOTE — ED Notes (Signed)
Pt wants Port to be used for Blood work.

## 2023-04-24 NOTE — ED Provider Notes (Signed)
[place] EMERGENCY DEPARTMENT AT [facility] Provider Note   CSN: [id number] Arrival date & time: 04/24/23  1419     History  Chief Complaint  Patient presents with   Altered Mental Status    Vanessa Short is a 57 y.o. female.  This is a 57 year old female with history of MS, CKD stage III, who presents to the ED with altered mental status. Patient was recently admitted for same. Patient will intermittently drift off and be momentarily unresponsive while being questioned. No recent fever. No urinary symptoms. Denies chest pain, abdominal pain, shortness of breath. Husband reports patient has been more confused. Only change in daily routine since recent discharge is returning to baseline medication administration.  The history is provided by the patient, the spouse and medical records. No language interpreter was used.  Altered Mental Status Presenting symptoms: behavior changes, lethargy and partial responsiveness   Severity:  Moderate Most recent episode:  2 days ago Episode history:  Multiple Timing:  Intermittent Progression:  Worsening Associated symptoms: no abdominal pain, no difficulty breathing, no fever, no nausea and no vomiting        Home Medications Prior to Admission medications   Medication Sig Start Date End Date Taking? Authorizing Provider  acyclovir (ZOVIRAX) 400 MG tablet Take 400 mg by mouth daily. Take every day per patient 08/11/14   [provider]  aspirin EC 81 MG tablet Take 81 mg by mouth daily.    [provider]  b complex vitamins tablet Take 1 tablet by mouth daily.     [provider]  baclofen (LIORESAL) 10 MG tablet 1 to 2 po qid up to 8/day 02/12/23   Sater, Pearletha Furl, MD  cholecalciferol (VITAMIN D) 1000 UNITS tablet Take 1,000 Units by mouth daily.     [provider]  dantrolene (DANTRIUM) 50 MG capsule Take 1 capsule (50 mg total) by mouth 3 (three) times daily as needed. 04/21/23    Burnadette Pop, MD  famotidine (PEPCID) 40 MG tablet Take 40 mg by mouth 2 (two) times daily. 02/28/23   [provider]  FLUoxetine (PROZAC) 20 MG capsule Take 20 mg by mouth 3 (three) times daily.    [provider]  folic acid (FOLVITE) 400 MCG tablet Take 800 mcg by mouth daily.    [provider]  furosemide (LASIX) 40 MG tablet Take 1 tablet (40 mg total) by mouth 2 (two) times daily. 08/19/16   Rai, Delene Ruffini, MD  guaiFENesin (MUCINEX) 600 MG 12 hr tablet Take 2 tablets (1,200 mg total) by mouth 2 (two) times daily for 5 days. 04/21/23 04/26/23  Burnadette Pop, MD  labetalol (NORMODYNE) 100 MG tablet TAKE 1/2 TABLET BY MOUTH DAILY 01/10/23   Sater, Pearletha Furl, MD  LAGEVRIO 200 MG CAPS capsule Take 4 capsules by mouth 2 (two) times daily. 04/09/23   [provider]  lidocaine-prilocaine (EMLA) cream Apply one inch before port-a cath access prn Patient taking differently: Apply 1 Application topically daily as needed (port access). 03/23/21   Sater, Pearletha Furl, MD  loperamide (IMODIUM) 2 MG capsule Take 1 capsule (2 mg total) by mouth every 4 (four) hours as needed for diarrhea or loose stools. 04/21/23   Burnadette Pop, MD  lubiprostone (AMITIZA) 8 MCG capsule Take 8-16 mcg by mouth 2 (two) times daily with a meal. Take  in the morning and 16 mcg in the afternoon.    [provider]  mesalamine (APRISO)  0.375 g 24 hr capsule Take 375 mg by mouth QID.    [provider]  Multiple Vitamin (MULTIVITAMIN) tablet Take 1 tablet by mouth daily.    [provider]  mupirocin ointment (BACTROBAN) 2 % Apply 1 Application topically 2 (two) times daily. Patient not taking: Reported on 04/18/2023 01/28/23   Leroy Sea, MD  OXcarbazepine (TRILEPTAL) 150 MG tablet Take 1 tablet (150 mg total) by mouth 3 (three) times daily. 12/04/22   Sater, Pearletha Furl, MD  potassium chloride (KLOR-CON) 10 MEQ tablet Take 2 tablets (20 mEq total) by mouth daily.  Take 4 pills daily for 3 days then continue taking as before 04/22/23   Burnadette Pop, MD  QUEtiapine (SEROQUEL) 25 MG tablet One po qHS and one po every day prn 02/12/23   Sater, Pearletha Furl, MD  rosuvastatin (CRESTOR) 20 MG tablet Take 20 mg by mouth daily.    [provider]  sodium chloride (OCEAN) 0.65 % SOLN nasal spray Place 1 spray into both nostrils as needed for congestion. Patient taking differently: Place 1 spray into both nostrils daily as needed for congestion. 08/19/16   Rai, Ripudeep Kirtland Bouchard, MD  Teriflunomide 14 MG TABS Take 1 tablet (14 mg total) by mouth daily. HOLD until cleared by neurology outpatient to resume. 04/21/23   Burnadette Pop, MD  tolterodine (DETROL) 2 MG tablet Take 2 mg by mouth 2 (two) times daily.  05/09/18   [provider]      Allergies    Nitrofurantoin, Oxycodone-acetaminophen, Risperidone and related, and Penicillins    Review of Systems   Review of Systems  Constitutional:  Negative for fever.  Respiratory:  Negative for shortness of breath.   Cardiovascular:  Negative for chest pain.  Gastrointestinal:  Negative for abdominal pain, nausea and vomiting.  All other systems reviewed and are negative.   Physical Exam Updated Vital Signs BP 111/69 (BP Location: Right Arm)   Pulse 98   Temp 98.2 F (36.8 C) (Oral)   Resp 18   Ht 5\' 3"  (1.6 m)   Wt 86.5 kg   SpO2 100%   BMI 33.78 kg/m  Physical Exam Vitals and nursing note reviewed.  HENT:     Head: Normocephalic.  Eyes:     Conjunctiva/sclera: Conjunctivae normal.  Cardiovascular:     Rate and Rhythm: Normal rate and regular rhythm.  Pulmonary:     Effort: Pulmonary effort is normal.     Breath sounds: Normal breath sounds.  Abdominal:     Palpations: Abdomen is soft.  Skin:    General: Skin is warm and dry.  Neurological:     Mental Status: She is disoriented.     ED Results / Procedures / Treatments   Labs (all labs ordered are listed, but only abnormal results are  displayed) Labs Reviewed  COMPREHENSIVE METABOLIC PANEL - Abnormal; Notable for the following components:      Result Value   Potassium 3.3 (*)    Glucose, Bld 105 (*)    Creatinine, Ser 1.36 (*)    Calcium 8.5 (*)    Albumin 3.1 (*)    Alkaline Phosphatase 131 (*)    GFR, Estimated 45 (*)    All other components within normal limits  CBC WITH DIFFERENTIAL/PLATELET - Abnormal; Notable for the following components:   RBC 3.15 (*)    Hemoglobin 9.2 (*)    HCT 30.8 (*)    MCHC 29.9 (*)    RDW 17.7 (*)  All other components within normal limits  URINALYSIS, ROUTINE W REFLEX MICROSCOPIC - Abnormal; Notable for the following components:   APPearance CLOUDY (*)    Hgb urine dipstick LARGE (*)    Leukocytes,Ua LARGE (*)    Bacteria, UA RARE (*)    All other components within normal limits  CBG MONITORING, ED - Abnormal; Notable for the following components:   Glucose-Capillary 102 (*)    All other components within normal limits  AMMONIA    EKG None  Radiology No results found.  Procedures Procedures    Medications Ordered in ED Medications - No data to display  ED Course/ Medical Decision Making/ A&P                                 Medical Decision Making Amount and/or Complexity of Data Reviewed Labs: ordered. Radiology: ordered.   Patient presents with altered mental status. Patient confused and somnolent on exam. Recent admission for similar symptoms. Work-up today is reassuring. Labs are at baseline from recent admission. No evidence of hepatic encephalopathy or electrolyte derangement. Consider poly-pharmacy vs possible neurologic etiology (ie, dementia).  Patient discussed with Dr. Maple Hudson. Recommends repeating CT of head.  Patient signed out to Dr Maple Hudson pending completion of testing.        Final Clinical Impression(s) / ED Diagnoses Final diagnoses:  None    Rx / DC Orders ED Discharge Orders     None         Felicie Morn, NP 04/24/23  2301    Coral Spikes, DO 04/24/23 2329

## 2023-04-24 NOTE — ED Triage Notes (Signed)
Pt from home with husband. During triage pt falls asleep while answering questions. Pt was recently hospitalized and this was happening then as well. Pt states she is confused. A&OX2 SELF and place. Disoriented to time. Denies urinary frequency. Denies changes in medication. Denies pain or discomfort.

## 2023-04-25 ENCOUNTER — Encounter (HOSPITAL_COMMUNITY): Payer: Self-pay | Admitting: Family Medicine

## 2023-04-25 DIAGNOSIS — G35 Multiple sclerosis: Secondary | ICD-10-CM

## 2023-04-25 DIAGNOSIS — N179 Acute kidney failure, unspecified: Secondary | ICD-10-CM | POA: Diagnosis not present

## 2023-04-25 DIAGNOSIS — G4733 Obstructive sleep apnea (adult) (pediatric): Secondary | ICD-10-CM

## 2023-04-25 DIAGNOSIS — G9340 Encephalopathy, unspecified: Secondary | ICD-10-CM | POA: Diagnosis not present

## 2023-04-25 DIAGNOSIS — R4182 Altered mental status, unspecified: Secondary | ICD-10-CM | POA: Insufficient documentation

## 2023-04-25 DIAGNOSIS — K5190 Ulcerative colitis, unspecified, without complications: Secondary | ICD-10-CM

## 2023-04-25 DIAGNOSIS — N189 Chronic kidney disease, unspecified: Secondary | ICD-10-CM

## 2023-04-25 DIAGNOSIS — E669 Obesity, unspecified: Secondary | ICD-10-CM

## 2023-04-25 DIAGNOSIS — Z993 Dependence on wheelchair: Secondary | ICD-10-CM

## 2023-04-25 DIAGNOSIS — I5032 Chronic diastolic (congestive) heart failure: Secondary | ICD-10-CM

## 2023-04-25 LAB — CBC
HCT: 29.2 % — ABNORMAL LOW (ref 36.0–46.0)
Hemoglobin: 8.7 g/dL — ABNORMAL LOW (ref 12.0–15.0)
MCH: 29.3 pg (ref 26.0–34.0)
MCHC: 29.8 g/dL — ABNORMAL LOW (ref 30.0–36.0)
MCV: 98.3 fL (ref 80.0–100.0)
Platelets: 171 10*3/uL (ref 150–400)
RBC: 2.97 MIL/uL — ABNORMAL LOW (ref 3.87–5.11)
RDW: 17.8 % — ABNORMAL HIGH (ref 11.5–15.5)
WBC: 8.4 10*3/uL (ref 4.0–10.5)
nRBC: 0 % (ref 0.0–0.2)

## 2023-04-25 LAB — BASIC METABOLIC PANEL
Anion gap: 8 (ref 5–15)
BUN: 18 mg/dL (ref 6–20)
CO2: 22 mmol/L (ref 22–32)
Calcium: 8.2 mg/dL — ABNORMAL LOW (ref 8.9–10.3)
Chloride: 112 mmol/L — ABNORMAL HIGH (ref 98–111)
Creatinine, Ser: 1.26 mg/dL — ABNORMAL HIGH (ref 0.44–1.00)
GFR, Estimated: 49 mL/min — ABNORMAL LOW (ref >60)
Glucose, Bld: 105 mg/dL — ABNORMAL HIGH (ref 70–99)
Potassium: 3.6 mmol/L (ref 3.5–5.1)
Sodium: 142 mmol/L (ref 135–145)

## 2023-04-25 LAB — MRSA NEXT GEN BY PCR, NASAL: MRSA by PCR Next Gen: NOT DETECTED

## 2023-04-25 MED ORDER — LUBIPROSTONE 8 MCG PO CAPS
16.0000 ug | ORAL_CAPSULE | Freq: Every day | ORAL | Status: DC
  Filled 2023-04-25: qty 2

## 2023-04-25 MED ORDER — HEPARIN SOD (PORK) LOCK FLUSH 100 UNIT/ML IV SOLN
500.0000 [IU] | INTRAVENOUS | Status: AC | PRN
  Administered 2023-04-25: 500 [IU]

## 2023-04-25 MED ORDER — SODIUM CHLORIDE 0.9% FLUSH
10.0000 mL | Freq: Two times a day (BID) | INTRAVENOUS | Status: DC
  Administered 2023-04-25: 10 mL

## 2023-04-25 MED ORDER — CHLORHEXIDINE GLUCONATE CLOTH 2 % EX PADS
6.0000 | MEDICATED_PAD | Freq: Every day | CUTANEOUS | Status: DC
  Administered 2023-04-25: 6 via TOPICAL

## 2023-04-25 MED ORDER — FUROSEMIDE 40 MG PO TABS: 20.0000 mg | ORAL_TABLET | Freq: Two times a day (BID) | ORAL | Status: AC

## 2023-04-25 MED ORDER — ASPIRIN 81 MG PO TBEC
81.0000 mg | DELAYED_RELEASE_TABLET | Freq: Every day | ORAL | Status: DC
  Administered 2023-04-25: 81 mg via ORAL
  Filled 2023-04-25: qty 1

## 2023-04-25 MED ORDER — BACLOFEN 10 MG PO TABS
10.0000 mg | ORAL_TABLET | Freq: Four times a day (QID) | ORAL | Status: DC
  Administered 2023-04-25 (×2): 10 mg via ORAL
  Filled 2023-04-25 (×2): qty 1

## 2023-04-25 MED ORDER — MESALAMINE ER 250 MG PO CPCR
500.0000 mg | ORAL_CAPSULE | Freq: Three times a day (TID) | ORAL | Status: DC
  Administered 2023-04-25: 500 mg via ORAL
  Filled 2023-04-25 (×2): qty 2

## 2023-04-25 MED ORDER — FLUOXETINE HCL 20 MG PO CAPS
20.0000 mg | ORAL_CAPSULE | Freq: Three times a day (TID) | ORAL | Status: DC
  Administered 2023-04-25: 20 mg via ORAL
  Filled 2023-04-25: qty 1

## 2023-04-25 MED ORDER — OXCARBAZEPINE 150 MG PO TABS
150.0000 mg | ORAL_TABLET | Freq: Three times a day (TID) | ORAL | Status: DC
  Administered 2023-04-25: 150 mg via ORAL
  Filled 2023-04-25: qty 1

## 2023-04-25 MED ORDER — LOPERAMIDE HCL 2 MG PO CAPS: 2.0000 mg | ORAL_CAPSULE | ORAL | Status: DC | PRN

## 2023-04-25 MED ORDER — LUBIPROSTONE 8 MCG PO CAPS
8.0000 ug | ORAL_CAPSULE | Freq: Every day | ORAL | Status: DC
  Administered 2023-04-25: 8 ug via ORAL
  Filled 2023-04-25: qty 1

## 2023-04-25 MED ORDER — SODIUM CHLORIDE 0.9% FLUSH: 10.0000 mL | INTRAVENOUS | Status: DC | PRN

## 2023-04-25 MED ORDER — FOLIC ACID 1 MG PO TABS
1.0000 mg | ORAL_TABLET | Freq: Every day | ORAL | Status: DC
  Administered 2023-04-25: 1 mg via ORAL
  Filled 2023-04-25: qty 1

## 2023-04-25 MED ORDER — ACYCLOVIR 400 MG PO TABS
400.0000 mg | ORAL_TABLET | Freq: Every day | ORAL | Status: DC
  Administered 2023-04-25: 400 mg via ORAL
  Filled 2023-04-25 (×2): qty 1

## 2023-04-25 MED ORDER — POTASSIUM CHLORIDE CRYS ER 20 MEQ PO TBCR
40.0000 meq | EXTENDED_RELEASE_TABLET | Freq: Once | ORAL | Status: AC
  Administered 2023-04-25: 40 meq via ORAL
  Filled 2023-04-25: qty 2

## 2023-04-25 MED ORDER — FAMOTIDINE 20 MG PO TABS
40.0000 mg | ORAL_TABLET | Freq: Two times a day (BID) | ORAL | Status: DC
  Administered 2023-04-25 (×2): 40 mg via ORAL
  Filled 2023-04-25 (×2): qty 2

## 2023-04-25 MED ORDER — LABETALOL HCL 100 MG PO TABS
50.0000 mg | ORAL_TABLET | Freq: Every day | ORAL | Status: DC
  Administered 2023-04-25: 50 mg via ORAL
  Filled 2023-04-25: qty 1

## 2023-04-25 MED ORDER — ENOXAPARIN SODIUM 40 MG/0.4ML IJ SOSY
40.0000 mg | PREFILLED_SYRINGE | INTRAMUSCULAR | Status: DC
  Administered 2023-04-25: 40 mg via SUBCUTANEOUS
  Filled 2023-04-25: qty 0.4

## 2023-04-25 MED ORDER — FESOTERODINE FUMARATE ER 4 MG PO TB24
4.0000 mg | ORAL_TABLET | Freq: Every day | ORAL | Status: DC
  Administered 2023-04-25: 4 mg via ORAL
  Filled 2023-04-25: qty 1

## 2023-04-25 MED ORDER — MESALAMINE ER 0.375 G PO CP24: 0.3750 g | ORAL_CAPSULE | Freq: Four times a day (QID) | ORAL | Status: DC

## 2023-04-25 MED ORDER — DANTROLENE SODIUM 25 MG PO CAPS: 50.0000 mg | ORAL_CAPSULE | Freq: Three times a day (TID) | ORAL | Status: DC | PRN

## 2023-04-25 MED ORDER — BACLOFEN 10 MG PO TABS: 10.0000 mg | ORAL_TABLET | Freq: Four times a day (QID) | ORAL | Status: DC

## 2023-04-25 MED ORDER — LACTATED RINGERS IV SOLN: INTRAVENOUS | Status: DC

## 2023-04-25 NOTE — Assessment & Plan Note (Signed)
-   Does not use CPAP - Awaiting in lab titration for home

## 2023-04-25 NOTE — H&P (Incomplete)
History and Physical    [name] XBM:[id number] DOB: [birth year] DOA: 04/24/2023 DOS: the patient was seen and examined on 04/25/2023 PCP: Richmond Campbell., [name]  Patient coming from: Home  Chief Complaint:  Chief Complaint  Patient presents with  . Altered Mental Status   HPI: Vanessa Short is a 57 y.o. female with medical history significant of multiple sclerosis with spastic quadriparesis, ulcerative colitis, CKD stage III, hypertension, hyperlipidemia, OSA not on CPAP who presented with lethargy, somnolence from home.    Patient was just hospitalized from 9/3-9/7 for similar symptoms and thought likely multifactorial from AKI and polypharmacy.   In the ED, she was afebrile, normotensive on room air,   No leukocytosis, hemoglobin around baseline at 9.  CMP notable for mild hypokalemia of 3.3, glucose of 105, creatinine of 1.36 from prior 1.04.  Ammonia level within normal limits.  UA with large leukocyte, negative nitrite and rare bacteria which is similar to prior and cultures at that time revealed no growth.  CT head is negative.   Review of Systems: {ROS_Text:26778} Past Medical History:  Diagnosis Date  . Headache   . Herpes genitalis in women   . History of kidney stones   . Hypertension   . Kidney stones   . Movement disorder   . Multiple sclerosis (HCC)   . Neuropathy   . Oral herpes   . Ulcerative colitis (HCC)   . Vision abnormalities    Past Surgical History:  Procedure Laterality Date  . ENDOMETRIAL ABLATION  10/10/2017  . KIDNEY STONE SURGERY    . PORTACATH PLACEMENT N/A 10/16/2017   Procedure: ULTRA SOUND GUIDED INSERTION PORT-A-CATH ERAS PATHWAY;  Surgeon: Sheliah Hatch De Blanch, MD;  Location: [facility] ORS;  Service: General;  Laterality: N/A;   Social History:  reports that she has never smoked. She has never used smokeless tobacco. She reports current alcohol use. She reports that she does not use drugs.  Allergies  Allergen  Reactions  . Nitrofurantoin Other (See Comments)    syncope  . Oxycodone-Acetaminophen Other (See Comments)  . Risperidone And Related Other (See Comments)    Became very lethargic  . Penicillins Rash    Family History  Problem Relation Age of Onset  . Dementia Mother   . Hypertension Father   . Hyperlipidemia Father   . Diabetes Father   . Heart disease Father   . Breast cancer Neg Hx     Prior to Admission medications   Medication Sig Start Date End Date Taking? Authorizing Provider  acyclovir (ZOVIRAX) 400 MG tablet Take 400 mg by mouth daily. Take every day per patient 08/11/14   [provider]  aspirin EC 81 MG tablet Take 81 mg by mouth daily.    [provider]  b complex vitamins tablet Take 1 tablet by mouth daily.     [provider]  baclofen (LIORESAL) 10 MG tablet 1 to 2 po qid up to 8/day 02/12/23   Sater, Pearletha Furl, MD  cholecalciferol (VITAMIN D) 1000 UNITS tablet Take 1,000 Units by mouth daily.     [provider]  dantrolene (DANTRIUM) 50 MG capsule Take 1 capsule (50 mg total) by mouth 3 (three) times daily as needed. 04/21/23   Burnadette Pop, MD  famotidine (PEPCID) 40 MG tablet Take 40 mg by mouth 2 (two) times daily. 02/28/23   [provider]  FLUoxetine (PROZAC) 20 MG capsule Take 20 mg by mouth 3 (three) times daily.  [provider]  folic acid (FOLVITE) 400 MCG tablet Take 800 mcg by mouth daily.    [provider]  furosemide (LASIX) 40 MG tablet Take 1 tablet (40 mg total) by mouth 2 (two) times daily. 08/19/16   Rai, Delene Ruffini, MD  guaiFENesin (MUCINEX) 600 MG 12 hr tablet Take 2 tablets (1,200 mg total) by mouth 2 (two) times daily for 5 days. 04/21/23 04/26/23  Burnadette Pop, MD  labetalol (NORMODYNE) 100 MG tablet TAKE 1/2 TABLET BY MOUTH DAILY 01/10/23   Sater, Pearletha Furl, MD  LAGEVRIO 200 MG CAPS capsule Take 4 capsules by mouth 2 (two) times daily. 04/09/23   [provider]   lidocaine-prilocaine (EMLA) cream Apply one inch before port-a cath access prn Patient taking differently: Apply 1 Application topically daily as needed (port access). 03/23/21   Sater, Pearletha Furl, MD  loperamide (IMODIUM) 2 MG capsule Take 1 capsule (2 mg total) by mouth every 4 (four) hours as needed for diarrhea or loose stools. 04/21/23   Burnadette Pop, MD  lubiprostone (AMITIZA) 8 MCG capsule Take 8-16 mcg by mouth 2 (two) times daily with a meal. Take  in the morning and 16 mcg in the afternoon.    [provider]  mesalamine (APRISO) 0.375 g 24 hr capsule Take 375 mg by mouth QID.    [provider]  Multiple Vitamin (MULTIVITAMIN) tablet Take 1 tablet by mouth daily.    [provider]  mupirocin ointment (BACTROBAN) 2 % Apply 1 Application topically 2 (two) times daily. Patient not taking: Reported on 04/18/2023 01/28/23   Leroy Sea, MD  OXcarbazepine (TRILEPTAL) 150 MG tablet Take 1 tablet (150 mg total) by mouth 3 (three) times daily. 12/04/22   Sater, Pearletha Furl, MD  potassium chloride (KLOR-CON) 10 MEQ tablet Take 2 tablets (20 mEq total) by mouth daily. Take 4 pills daily for 3 days then continue taking as before 04/22/23   Burnadette Pop, MD  QUEtiapine (SEROQUEL) 25 MG tablet One po qHS and one po every day prn 02/12/23   Sater, Pearletha Furl, MD  rosuvastatin (CRESTOR) 20 MG tablet Take 20 mg by mouth daily.    [provider]  sodium chloride (OCEAN) 0.65 % SOLN nasal spray Place 1 spray into both nostrils as needed for congestion. Patient taking differently: Place 1 spray into both nostrils daily as needed for congestion. 08/19/16   Rai, Ripudeep Kirtland Bouchard, MD  Teriflunomide 14 MG TABS Take 1 tablet (14 mg total) by mouth daily. HOLD until cleared by neurology outpatient to resume. 04/21/23   Burnadette Pop, MD  tolterodine (DETROL) 2 MG tablet Take 2 mg by mouth 2 (two) times daily.  05/09/18   [provider]    Physical Exam: Vitals:    04/24/23 1730 04/24/23 1800 04/24/23 2100 04/24/23 2200  BP: (!) 133/55 136/80 (!) 140/66   Pulse: 70 71 77   Resp: 20 17 17    Temp:  97.6 F (36.4 C)  98.1 F (36.7 C)  TempSrc:  Oral  Oral  SpO2: 100% 100% 100%   Weight:      Height:       *** Data Reviewed: {Tip this will not be part of the note when signed- Document your independent interpretation of telemetry tracing, EKG, lab, Radiology test or any other diagnostic tests. Add any new diagnostic test ordered today. (Optional):26781} {Results:26384}  Assessment and Plan: No notes have been filed under this hospital service. Service: Hospitalist  Advance Care Planning:   Code Status: Prior ***  Consults: ***  Family Communication: ***  Severity of Illness: {Observation/Inpatient:21159}  Author: Anselm Jungling, DO 04/25/2023 12:01 AM  For on call review [email or web address].

## 2023-04-25 NOTE — Discharge Summary (Signed)
Physician Discharge Summary   [name] MRN: [id number] DOB: Dec 28, 1965  Admit date:     04/24/2023  Discharge date: {dischdate:26783}  Discharge Physician: Jacquelin Hawking   PCP: Richmond Campbell., [name]   Recommendations at discharge:  {Tip this will not be part of the note when signed- Example include specific recommendations for outpatient follow-up, pending tests to follow-up on. (Optional):26781}  ***  Discharge Diagnoses: Principal Problem:   Acute encephalopathy Active Problems:   Acute kidney injury superimposed on chronic kidney disease (HCC)   Obstructive sleep apnea   Multiple sclerosis (HCC)   Hypokalemia   Ulcerative colitis (HCC)   Chronic diastolic CHF (congestive heart failure) (HCC)   Wheelchair dependence   Obesity (BMI 30-39.9)  Resolved Problems:   * No resolved hospital problems. [facility] Course: No notes on file  Assessment and Plan: * Acute encephalopathy -suspect secondary to AKI and polypharmacy. Baseline on current Cr clearance pt exceeds Baclofen dose -pt and husband agree to trial cutting Baclofen down to 10mg  QID rather than total of 60mg   -hold seroquel, dantrolene and Trileptal overnight  -will receive continuous IV fluid overnight for AKI  Acute kidney injury superimposed on chronic kidney disease (HCC) - AKI on CKD 3A-baseline creatinine ranged from 1-1.1 - Creatinine 1.36 on presentation. Pt is on BID Lasix and has been taking ibuprofen more frequently. Will hold Lasix overnight. 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 , ,  (Optional):26781}  {(NOTE) Pain control PDMP Statment (Optional):26782} Consultants: *** Procedures performed: ***  Disposition: {Plan; Disposition:26390} Diet recommendation:  {Diet_Plan:26776} DISCHARGE MEDICATION: Allergies as of 04/25/2023       Reactions   Nitrofurantoin Other (See Comments)   syncope   Oxycodone-acetaminophen Other (See Comments)   Risperidone And Related Other (See Comments)   Became very lethargic   Penicillins Rash        Medication List     STOP taking these medications    guaiFENesin 600 MG 12 hr tablet Commonly known as: MUCINEX   Lagevrio 200 MG Caps capsule Generic drug: molnupiravir EUA   loperamide 2 MG capsule Commonly known as: IMODIUM   mupirocin ointment 2 % Commonly known as: BACTROBAN       TAKE these medications    acyclovir 400 MG tablet Commonly known as: ZOVIRAX Take 400 mg by mouth daily. Take every day per patient   aspirin EC 81 MG tablet Take 81 mg by mouth daily.   b complex vitamins tablet Take 1 tablet by mouth daily.   baclofen 10 MG tablet Commonly known as: LIORESAL Take 1 tablet (10 mg total) by mouth 4 (four) times daily. 1 to 2 po qid up to 8/day What changed:  how much to take how to take this when to take this   cholecalciferol 1000 units tablet Commonly known as: VITAMIN D Take 1,000 Units by mouth daily.   dantrolene 50 MG capsule Commonly known as: DANTRIUM Take  1 capsule (50 mg total) by mouth 3 (three) times daily as needed.   famotidine 40 MG tablet Commonly known as: PEPCID Take 40 mg by mouth 2 (two) times daily.   FLUoxetine 20 MG capsule Commonly known as: PROZAC Take 20 mg by mouth 3 (three) times daily.   folic acid 400 MCG tablet Commonly known as: FOLVITE Take 800 mcg by mouth daily.    furosemide 40 MG tablet Commonly known as: LASIX Take 0.5 tablets (20 mg total) by mouth 2 (two) times daily. What changed: how much to take   labetalol 100 MG tablet Commonly known as: NORMODYNE TAKE 1/2 TABLET BY MOUTH DAILY   lidocaine-prilocaine cream Commonly known as: EMLA Apply one inch before port-a cath access prn What changed:  how much to take how to take this when to take this reasons to take this additional instructions   lubiprostone 8 MCG capsule Commonly known as: AMITIZA Take 8-16 mcg by mouth 2 (two) times daily with a meal. Take  in the morning and 16 mcg in the afternoon.   mesalamine 0.375 g 24 hr capsule Commonly known as: APRISO Take 375 mg by mouth QID.   multivitamin tablet Take 1 tablet by mouth daily.   OXcarbazepine 150 MG tablet Commonly known as: TRILEPTAL Take 1 tablet (150 mg total) by mouth 3 (three) times daily.   potassium chloride 10 MEQ tablet Commonly known as: KLOR-CON Take 2 tablets (20 mEq total) by mouth daily. Take 4 pills daily for 3 days then continue taking as before   QUEtiapine 25 MG tablet Commonly known as: SEROQUEL One po qHS and one po every day prn   rosuvastatin 20 MG tablet Commonly known as: CRESTOR Take 20 mg by mouth daily.   sodium chloride 0.65 % Soln nasal spray Commonly known as: OCEAN Place 1 spray into both nostrils as needed for congestion. What changed: when to take this   Teriflunomide 14 MG Tabs Take 1 tablet (14 mg total) by mouth daily. HOLD until cleared by neurology outpatient to resume.   tolterodine 2 MG tablet Commonly known as: DETROL Take 2 mg by mouth 2 (two) times daily.        Follow-up Information     Richmond Campbell., [name]. Schedule an appointment as soon as possible for a visit in 1 week(s).   Specialty: Family Medicine Why: For hospital follow-up Contact information: [address] [place] [address] [phone]                Discharge  Exam: Ceasar Mons Weights   04/24/23 1440  Weight: 86.5 kg   ***  Condition at discharge: {DC Condition:26389}  The results of significant diagnostics from this hospitalization (including imaging, microbiology, ancillary and laboratory) are listed below for reference.   Imaging Studies: CT Head Wo Contrast  Result Date: 04/24/2023 CLINICAL DATA:  Altered mental status EXAM: CT HEAD WITHOUT CONTRAST TECHNIQUE: Contiguous axial images were obtained from the base of the skull through the vertex without intravenous contrast. RADIATION DOSE REDUCTION: This exam was performed according to the departmental dose-optimization program which includes automated exposure control, adjustment of the mA and/or kV according to patient size and/or use of iterative reconstruction technique. COMPARISON:  04/17/2023 FINDINGS: Brain: No mass, hemorrhage or extra-axial collection. There is multifocal hypoattenuation within the corona radiata bilaterally, unchanged. Periventricular white matter hypoattenuation. Mild volume loss. Left middle cranial fossa arachnoid cyst. Vascular: No hyperdense vessel or unexpected calcification. Skull: Normal. Negative for fracture or  focal lesion. Sinuses/Orbits: No acute finding. Other: None. IMPRESSION: No acute intracranial abnormality. Electronically Signed   By: Deatra Robinson M.D.   On: 04/24/2023 23:53   DG CHEST PORT 1 VIEW  Result Date: 04/21/2023 CLINICAL DATA:  57 year old female with history of shortness of breath. EXAM: PORTABLE CHEST 1 VIEW COMPARISON:  Chest x-ray 04/17/2023. FINDINGS: Right internal jugular single-lumen porta cath with tip terminating in the distal superior vena cava. Lung volumes are low. Opacity in the periphery of the left lung base, similar to prior examination, likely to reflect chronic pleuroparenchymal scarring. Possibility of a trace left pleural effusion is not excluded. Right lung is clear. No right pleural effusion. No pneumothorax. No evidence of  pulmonary edema. Heart size is normal. Upper mediastinal contours are within normal limits allowing for patient positioning. Atherosclerotic calcifications are noted in the thoracic aorta. IMPRESSION: 1. Similar radiographic appearance the chest with the prior study, with probable chronic scarring in the left lung base. 2. Aortic atherosclerosis. Electronically Signed   By: Trudie Reed M.D.   On: 04/21/2023 09:56   CT Head Wo Contrast  Result Date: 04/17/2023 CLINICAL DATA:  Mental status change, unknown cause Lethargy. EXAM: CT HEAD WITHOUT CONTRAST TECHNIQUE: Contiguous axial images were obtained from the base of the skull through the vertex without intravenous contrast. RADIATION DOSE REDUCTION: This exam was performed according to the departmental dose-optimization program which includes automated exposure control, adjustment of the mA and/or kV according to patient size and/or use of iterative reconstruction technique. COMPARISON:  Brain MRI 01/31/2023 FINDINGS: Brain: Again seen generalized atrophy. Periventricular and deep white matter hypodensity, likely related to demyelination or mass compared with prior MRI. No hemorrhage, evidence of acute infarct, hydrocephalus or extra-axial collection. Vascular: No hyperdense vessel or unexpected calcification. Skull: Normal. Negative for fracture or focal lesion. Sinuses/Orbits: Chronic mucosal thickening and opacification of the sphenoid sinus. No acute findings. No mastoid effusion. Other: None. IMPRESSION: 1. No acute intracranial abnormality. 2. Generalized atrophy. Periventricular and deep white matter hypodensity, likely related to demyelination when compared with prior MRI in the setting of multiple sclerosis. Electronically Signed   By: Narda Rutherford M.D.   On: 04/17/2023 18:46   DG Chest Port 1 View  Result Date: 04/17/2023 CLINICAL DATA:  Cough for 1 week. EXAM: PORTABLE CHEST 1 VIEW COMPARISON:  January 31, 2023. FINDINGS: Stable  cardiomediastinal silhouette. Right internal jugular Port-A-Cath is unchanged. Lungs are clear. Bony thorax is unremarkable. IMPRESSION: No active disease. Electronically Signed   By: Lupita Raider M.D.   On: 04/17/2023 15:46    Microbiology: Results for orders placed or performed during the hospital encounter of 04/17/23  Culture, blood (routine x 2)     Status: None   Collection Time: 04/17/23  2:39 PM   Specimen: BLOOD  Result Value Ref Range Status   Specimen Description BLOOD RIGHT ANTECUBITAL  Final   Special Requests   Final    BOTTLES DRAWN AEROBIC AND ANAEROBIC Blood Culture results may not be optimal due to an excessive volume of blood received in culture bottles   Culture   Final    NO GROWTH 5 DAYS Performed at [facility], [address] [place], [place] [address]    Report Status 04/22/2023 FINAL  Final  Culture, blood (routine x 2)     Status: None   Collection Time: 04/17/23  2:45 PM   Specimen: BLOOD RIGHT FOREARM  Result Value Ref Range Status   Specimen Description BLOOD RIGHT  FOREARM  Final   Special Requests   Final    BOTTLES DRAWN AEROBIC AND ANAEROBIC Blood Culture results may not be optimal due to an excessive volume of blood received in culture bottles   Culture   Final    NO GROWTH 5 DAYS Performed at [facility], [address] [place], [place] [address]    Report Status 04/22/2023 FINAL  Final  Urine Culture     Status: None   Collection Time: 04/17/23  4:04 PM   Specimen: Urine, Clean Catch  Result Value Ref Range Status   Specimen Description URINE, CLEAN CATCH  Final   Special Requests NONE Reflexed from [id number]  Final   Culture   Final    NO GROWTH Performed at [facility], [address] [place], [place] [address]    Report Status 04/18/2023 FINAL  Final  Gastrointestinal Panel by PCR , Stool     Status: None   Collection Time: 04/19/23  6:15 PM   Specimen: Stool  Result Value Ref Range Status   Campylobacter species  NOT DETECTED NOT DETECTED Final   Plesimonas shigelloides NOT DETECTED NOT DETECTED Final   Salmonella species NOT DETECTED NOT DETECTED Final   Yersinia enterocolitica NOT DETECTED NOT DETECTED Final   Vibrio species NOT DETECTED NOT DETECTED Final   Vibrio cholerae NOT DETECTED NOT DETECTED Final   Enteroaggregative E coli (EAEC) NOT DETECTED NOT DETECTED Final   Enteropathogenic E coli (EPEC) NOT DETECTED NOT DETECTED Final   Enterotoxigenic E coli (ETEC) NOT DETECTED NOT DETECTED Final   Shiga like toxin producing E coli (STEC) NOT DETECTED NOT DETECTED Final   Shigella/Enteroinvasive E coli (EIEC) NOT DETECTED NOT DETECTED Final   Cryptosporidium NOT DETECTED NOT DETECTED Final   Cyclospora cayetanensis NOT DETECTED NOT DETECTED Final   Entamoeba histolytica NOT DETECTED NOT DETECTED Final   Giardia lamblia NOT DETECTED NOT DETECTED Final   Adenovirus F40/41 NOT DETECTED NOT DETECTED Final   Astrovirus NOT DETECTED NOT DETECTED Final   Norovirus GI/GII NOT DETECTED NOT DETECTED Final   Rotavirus A NOT DETECTED NOT DETECTED Final   Sapovirus (I, II, IV, and V) NOT DETECTED NOT DETECTED Final    Comment: Performed at [facility], [address] [place], [place] [address]    Labs: CBC: Recent Labs  Lab 04/19/23 0420 04/20/23 0310 04/24/23 1930 04/25/23 0800  WBC 10.6* 11.1* 8.7 8.4  NEUTROABS  --   --  6.5  --   HGB 8.2* 8.3* 9.2* 8.7*  HCT 27.3* 27.3* 30.8* 29.2*  MCV 94.8 94.1 97.8 98.3  PLT 245 225 167 171   Basic Metabolic Panel: Recent Labs  Lab 04/19/23 0420 04/20/23 0310 04/21/23 0311 04/24/23 1930 04/25/23 0800  NA 143 142 139 141 142  K 3.6 3.4* 3.4* 3.3* 3.6  CL 113* 110 111 109 112*  CO2 21* 20* 19* 22 22  GLUCOSE 114* 148* 118* 105* 105*  BUN 23* 21* 11 19 18   CREATININE 1.41* 1.17* 1.04* 1.36* 1.26*  CALCIUM 8.9 8.7* 8.2* 8.5* 8.2*   Liver Function Tests: Recent Labs  Lab 04/19/23 0420 04/24/23 1930  AST 16 24  ALT 31 35   ALKPHOS 150* 131*  BILITOT 0.7 0.4  PROT 5.7* 6.7  ALBUMIN 2.5* 3.1*   CBG: Recent Labs  Lab 04/24/23 1622  GLUCAP 102*    Discharge time spent: {LESS THAN/GREATER THAN:26388} 30 minutes.  Signed: Jacquelin Hawking, MD [facility] 04/25/2023

## 2023-04-25 NOTE — Assessment & Plan Note (Signed)
-  suspect secondary to AKI and polypharmacy. Baseline on current Cr clearance pt exceeds Baclofen dose -pt and husband agree to trial cutting Baclofen down to 10mg  QID rather than total of 60mg   -hold seroquel, dantrolene and Trileptal overnight  -will receive continuous IV fluid overnight for AKI

## 2023-04-25 NOTE — Assessment & Plan Note (Signed)
-

## 2023-04-25 NOTE — Assessment & Plan Note (Addendum)
-   AKI on CKD 3A-baseline creatinine ranged from 1-1.1 - Creatinine 1.36 on presentation. Pt is on BID Lasix and has been taking ibuprofen more frequently. Will hold Lasix overnight. -Keep on continuous IV fluid overnight -renally dose all medications

## 2023-04-25 NOTE — Assessment & Plan Note (Signed)
-   On mesalamine, Amitiza

## 2023-04-25 NOTE — ED Notes (Signed)
ED TO INPATIENT HANDOFF REPORT  Name/Age/Gender Vanessa Short 57 y.o. female  Code Status    Code Status Orders  (From admission, onward)           Start     Ordered   04/25/23 0049  Full code  Continuous       Question:  By:  Answer:  Consent: discussion documented in EHR   04/25/23 0049           Code Status History     Date Active Date Inactive Code Status Order ID Comments User Context   04/17/2023 2006 04/21/2023 1814 Full Code [id number]  Charlsie Quest, MD ED   01/31/2023 1937 02/03/2023 1817 Full Code [id number]  Mansy, Vernetta Honey, MD ED   01/26/2023 0853 01/28/2023 1823 Full Code [id number]  Jonah Blue, MD ED   01/26/2023 0429 01/26/2023 0853 Full Code [id number]  Howerter, Chaney Born, DO ED   07/29/2018 1615 07/31/2018 2131 Full Code [id number]  Howard Pouch, MD ED   09/30/2016 0149 10/02/2016 1726 Full Code [id number]  Briscoe Deutscher, MD Inpatient   08/05/2016 0251 08/19/2016 1950 Full Code [id number]  Danford, Earl Lites, MD ED       Home/SNF/Other Home  Chief Complaint AMS (altered mental status) [R41.82]  Level of Care/Admitting Diagnosis ED Disposition     ED Disposition  Admit   Condition  --   Comment  Hospital Area: [facility] [[id number]]  Level of Care: Med-Surg [16]  May place patient in observation at [facility] or [facility] if equivalent level of care is available:: No  Covid Evaluation: Asymptomatic - no recent exposure (last 10 days) testing not required  Diagnosis: AMS (altered mental status) [8756433]  Admitting Physician: Anselm Jungling [[id number]]  Attending Physician: Anselm Jungling [[id number]]          Medical History Past Medical History:  Diagnosis Date   Headache    Herpes genitalis in women    History of kidney stones    Hypertension    Kidney stones    Movement disorder    Multiple sclerosis (HCC)    Neuropathy    Oral herpes    Ulcerative colitis (HCC)    Vision abnormalities     Allergies Allergies   Allergen Reactions   Nitrofurantoin Other (See Comments)    syncope   Oxycodone-Acetaminophen Other (See Comments)   Risperidone And Related Other (See Comments)    Became very lethargic   Penicillins Rash    IV Location/Drains/Wounds Patient Lines/Drains/Airways Status     Active Line/Drains/Airways     Name Placement date Placement time Site Days   Implanted Port 10/16/17 Right Chest 10/16/17  1407  Chest  2017            Labs/Imaging Results for orders placed or performed during the hospital encounter of 04/24/23 (from the past 48 hour(s))  CBG monitoring, ED     Status: Abnormal   Collection Time: 04/24/23  4:22 PM  Result Value Ref Range   Glucose-Capillary 102 (H) 70 - 99 mg/dL    Comment: Glucose reference range applies only to samples taken after fasting for at least 8 hours.  Urinalysis, Routine w reflex microscopic -Urine, Clean Catch     Status: Abnormal   Collection Time: 04/24/23  7:03 PM  Result Value Ref Range   Color, Urine YELLOW YELLOW   APPearance CLOUDY (A) CLEAR   Specific Gravity, Urine 1.010 1.005 - 1.030   pH  5.0 5.0 - 8.0   Glucose, UA NEGATIVE NEGATIVE mg/dL   Hgb urine dipstick LARGE (A) NEGATIVE   Bilirubin Urine NEGATIVE NEGATIVE   Ketones, ur NEGATIVE NEGATIVE mg/dL   Protein, ur NEGATIVE NEGATIVE mg/dL   Nitrite NEGATIVE NEGATIVE   Leukocytes,Ua LARGE (A) NEGATIVE   RBC / HPF >50 0 - 5 RBC/hpf   WBC, UA >50 0 - 5 WBC/hpf   Bacteria, UA RARE (A) NONE SEEN   Squamous Epithelial / HPF 11-20 0 - 5 /HPF   Mucus PRESENT    Hyaline Casts, UA PRESENT     Comment: Performed at [facility], [address], [place], [place] [address]  Comprehensive metabolic panel     Status: Abnormal   Collection Time: 04/24/23  7:30 PM  Result Value Ref Range   Sodium 141 135 - 145 mmol/L   Potassium 3.3 (L) 3.5 - 5.1 mmol/L   Chloride 109 98 - 111 mmol/L   CO2 22 22 - 32 mmol/L   Glucose, Bld 105 (H) 70 - 99 mg/dL    Comment:  Glucose reference range applies only to samples taken after fasting for at least 8 hours.   BUN 19 6 - 20 mg/dL   Creatinine, Ser 7.03 (H) 0.44 - 1.00 mg/dL   Calcium 8.5 (L) 8.9 - 10.3 mg/dL   Total Protein 6.7 6.5 - 8.1 g/dL   Albumin 3.1 (L) 3.5 - 5.0 g/dL   AST 24 15 - 41 U/L   ALT 35 0 - 44 U/L   Alkaline Phosphatase 131 (H) 38 - 126 U/L   Total Bilirubin 0.4 0.3 - 1.2 mg/dL   GFR, Estimated 45 (L) >60 mL/min    Comment: (NOTE) Calculated using the CKD-EPI Creatinine Equation (2021)    Anion gap 10 5 - 15    Comment: Performed at [facility], [address] [place], [place] [address]  CBC WITH DIFFERENTIAL     Status: Abnormal   Collection Time: 04/24/23  7:30 PM  Result Value Ref Range   WBC 8.7 4.0 - 10.5 K/uL   RBC 3.15 (L) 3.87 - 5.11 MIL/uL   Hemoglobin 9.2 (L) 12.0 - 15.0 g/dL   HCT 81.8 (L) 29.9 - 37.1 %   MCV 97.8 80.0 - 100.0 fL   MCH 29.2 26.0 - 34.0 pg   MCHC 29.9 (L) 30.0 - 36.0 g/dL   RDW 69.6 (H) 78.9 - 38.1 %   Platelets 167 150 - 400 K/uL   nRBC 0.0 0.0 - 0.2 %   Neutrophils Relative % 74 %   Neutro Abs 6.5 1.7 - 7.7 K/uL   Lymphocytes Relative 10 %   Lymphs Abs 0.9 0.7 - 4.0 K/uL   Monocytes Relative 11 %   Monocytes Absolute 1.0 0.1 - 1.0 K/uL   Eosinophils Relative 3 %   Eosinophils Absolute 0.3 0.0 - 0.5 K/uL   Basophils Relative 1 %   Basophils Absolute 0.1 0.0 - 0.1 K/uL   Immature Granulocytes 1 %   Abs Immature Granulocytes 0.06 0.00 - 0.07 K/uL    Comment: Performed at [facility], [address], [place], [place] [address]  Ammonia     Status: None   Collection Time: 04/24/23  7:30 PM  Result Value Ref Range   Ammonia 17 9 - 35 umol/L    Comment: Performed at [facility], [address], [place], [place] [address]   CT Head Wo Contrast  Result  Date: 04/24/2023 CLINICAL DATA:  Altered mental status EXAM: CT HEAD WITHOUT CONTRAST TECHNIQUE: Contiguous axial images were obtained  from the base of the skull through the vertex without intravenous contrast. RADIATION DOSE REDUCTION: This exam was performed according to the departmental dose-optimization program which includes automated exposure control, adjustment of the mA and/or kV according to patient size and/or use of iterative reconstruction technique. COMPARISON:  04/17/2023 FINDINGS: Brain: No mass, hemorrhage or extra-axial collection. There is multifocal hypoattenuation within the corona radiata bilaterally, unchanged. Periventricular white matter hypoattenuation. Mild volume loss. Left middle cranial fossa arachnoid cyst. Vascular: No hyperdense vessel or unexpected calcification. Skull: Normal. Negative for fracture or focal lesion. Sinuses/Orbits: No acute finding. Other: None. IMPRESSION: No acute intracranial abnormality. Electronically Signed   By: Deatra Robinson M.D.   On: 04/24/2023 23:53    Pending Labs Unresulted Labs (From admission, onward)     Start     Ordered   04/25/23 0500  CBC  Tomorrow morning,   R        04/25/23 0049   04/25/23 0500  Basic metabolic panel  Tomorrow morning,   R        04/25/23 0049            Vitals/Pain Today's Vitals   04/24/23 2100 04/24/23 2200 04/24/23 2330 04/25/23 0100  BP: (!) 140/66  131/82   Pulse: 77  90   Resp: 17  12   Temp:  98.1 F (36.7 C)  98 F (36.7 C)  TempSrc:  Oral    SpO2: 100%  97%   Weight:      Height:      PainSc:        Isolation Precautions No active isolations  Medications Medications  enoxaparin (LOVENOX) injection 40 mg (has no administration in time range)  lactated ringers infusion ( Intravenous New Bag/Given 04/25/23 0128)  aspirin EC tablet 81 mg (has no administration in time range)  acyclovir (ZOVIRAX) tablet 400 mg (has no administration in time range)  baclofen (LIORESAL) tablet 10 mg (has no administration in time range)  dantrolene (DANTRIUM) capsule 50 mg (has no administration in time range)  OXcarbazepine  (TRILEPTAL) tablet 150 mg (has no administration in time range)  FLUoxetine (PROZAC) capsule 20 mg (has no administration in time range)  famotidine (PEPCID) tablet 40 mg (40 mg Oral Given 04/25/23 0124)  labetalol (NORMODYNE) tablet 50 mg (has no administration in time range)  loperamide (IMODIUM) capsule 2 mg (has no administration in time range)  lubiprostone (AMITIZA) capsule 8 mcg (has no administration in time range)  fesoterodine (TOVIAZ) tablet 4 mg (has no administration in time range)  folic acid (FOLVITE) tablet 1 mg (has no administration in time range)  potassium chloride SA (KLOR-CON M) CR tablet 40 mEq (has no administration in time range)  lubiprostone (AMITIZA) capsule 16 mcg (has no administration in time range)  mesalamine (PENTASA) CR capsule 500 mg (has no administration in time range)  sodium chloride 0.9 % bolus 1,000 mL (0 mLs Intravenous Stopped 04/25/23 0103)  potassium chloride SA (KLOR-CON M) CR tablet 20 mEq (20 mEq Oral Given 04/24/23 2341)    Mobility non-ambulatory

## 2023-04-25 NOTE — Assessment & Plan Note (Signed)
Estimated body mass index is 33.58 kg/m as calculated from the following:   Height as of this encounter: 5' (1.524 m).   Weight as of this encounter: 78 kg.   -This will complicate overall prognosis 

## 2023-04-25 NOTE — Assessment & Plan Note (Addendum)
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

## 2023-04-25 NOTE — Progress Notes (Signed)
   04/25/23 1428  TOC Brief Assessment  Insurance and Status Reviewed  Patient has primary care physician Yes  Home environment has been reviewed Resides with spouse  Prior level of function: Dependent for ADLs at baseline  Prior/Current Home Services Current home services (Aide)  Social Determinants of Health Reivew SDOH reviewed no interventions necessary  Readmission risk has been reviewed Yes  Transition of care needs no transition of care needs at this time

## 2023-04-25 NOTE — H&P (Addendum)
History and Physical    [name] OZH:[id number] DOB: 06-26-1966 DOA: 04/24/2023 DOS: the patient was seen and examined on 04/25/2023 PCP: Richmond Campbell., [name]  Patient coming from: Home  Chief Complaint:  Chief Complaint  Patient presents with   Altered Mental Status   HPI: Vanessa Short is a 57 y.o. female with medical history significant of multiple sclerosis with spastic quadriparesis, ulcerative colitis, CKD stage III, hypertension, hyperlipidemia, OSA not on CPAP who presented with lethargy, somnolence and AMS from home.    Husband at bedside provides most of the history.  Patient was just hospitalized from 9/3-9/7 for similar symptoms and thought likely multifactorial from AKI and polypharmacy. She did well the first day after discharge but the following day became somnolent again. Falling asleep as she was eating and not responsive. Forgetting simple things like if she ate or not. She is on total of 60mg  Baclofen, seroquel, dantrolene and Tripletal.  She has been taking up to 600mg  ibuprofen more frequently for body aches.   In the ED, she was afebrile, normotensive on room air,   No leukocytosis, hemoglobin around baseline at 9.  CMP notable for mild hypokalemia of 3.3, glucose of 105, creatinine of 1.36 from prior 1.04.  Ammonia level within normal limits.  UA with large leukocyte, negative nitrite and rare bacteria which is similar to prior and cultures at that time revealed no growth.  CT head is negative.   Review of Systems: As mentioned in the history of present illness. All other systems reviewed and are negative. Past Medical History:  Diagnosis Date   Headache    Herpes genitalis in women    History of kidney stones    Hypertension    Kidney stones    Movement disorder    Multiple sclerosis (HCC)    Neuropathy    Oral herpes    Ulcerative colitis (HCC)    Vision abnormalities    Past Surgical History:  Procedure Laterality Date    ENDOMETRIAL ABLATION  10/10/2017   KIDNEY STONE SURGERY     PORTACATH PLACEMENT N/A 10/16/2017   Procedure: ULTRA SOUND GUIDED INSERTION PORT-A-CATH ERAS PATHWAY;  Surgeon: Rodman Pickle, MD;  Location: [facility];  Service: General;  Laterality: N/A;   Social History:  reports that she has never smoked. She has never used smokeless tobacco. She reports current alcohol use. She reports that she does not use drugs.  Allergies  Allergen Reactions   Nitrofurantoin Other (See Comments)    syncope   Oxycodone-Acetaminophen Other (See Comments)   Risperidone And Related Other (See Comments)    Became very lethargic   Penicillins Rash    Family History  Problem Relation Age of Onset   Dementia Mother    Hypertension Father    Hyperlipidemia Father    Diabetes Father    Heart disease Father    Breast cancer Neg Hx     Prior to Admission medications   Medication Sig Start Date End Date Taking? Authorizing Provider  acyclovir (ZOVIRAX) 400 MG tablet Take 400 mg by mouth daily. Take every day per patient 08/11/14   [provider]  aspirin EC 81 MG tablet Take 81 mg by mouth daily.    [provider]  b complex vitamins tablet Take 1 tablet by mouth daily.     [provider]  baclofen (LIORESAL) 10 MG tablet 1 to 2 po qid up to 8/day 02/12/23   Sater, Pearletha Furl, MD  cholecalciferol (VITAMIN D) 1000 UNITS tablet Take 1,000 Units by mouth daily.     [provider]  dantrolene (DANTRIUM) 50 MG capsule Take 1 capsule (50 mg total) by mouth 3 (three) times daily as needed. 04/21/23   Burnadette Pop, MD  famotidine (PEPCID) 40 MG tablet Take 40 mg by mouth 2 (two) times daily. 02/28/23   [provider]  FLUoxetine (PROZAC) 20 MG capsule Take 20 mg by mouth 3 (three) times daily.    [provider]  folic acid (FOLVITE) 400 MCG tablet Take 800 mcg by mouth daily.    [provider]  furosemide (LASIX) 40 MG tablet Take 1 tablet  (40 mg total) by mouth 2 (two) times daily. 08/19/16   Rai, Delene Ruffini, MD  guaiFENesin (MUCINEX) 600 MG 12 hr tablet Take 2 tablets (1,200 mg total) by mouth 2 (two) times daily for 5 days. 04/21/23 04/26/23  Burnadette Pop, MD  labetalol (NORMODYNE) 100 MG tablet TAKE 1/2 TABLET BY MOUTH DAILY 01/10/23   Sater, Pearletha Furl, MD  LAGEVRIO 200 MG CAPS capsule Take 4 capsules by mouth 2 (two) times daily. 04/09/23   [provider]  lidocaine-prilocaine (EMLA) cream Apply one inch before port-a cath access prn Patient taking differently: Apply 1 Application topically daily as needed (port access). 03/23/21   Sater, Pearletha Furl, MD  loperamide (IMODIUM) 2 MG capsule Take 1 capsule (2 mg total) by mouth every 4 (four) hours as needed for diarrhea or loose stools. 04/21/23   Burnadette Pop, MD  lubiprostone (AMITIZA) 8 MCG capsule Take 8-16 mcg by mouth 2 (two) times daily with a meal. Take  in the morning and 16 mcg in the afternoon.    [provider]  mesalamine (APRISO) 0.375 g 24 hr capsule Take 375 mg by mouth QID.    [provider]  Multiple Vitamin (MULTIVITAMIN) tablet Take 1 tablet by mouth daily.    [provider]  mupirocin ointment (BACTROBAN) 2 % Apply 1 Application topically 2 (two) times daily. Patient not taking: Reported on 04/18/2023 01/28/23   Leroy Sea, MD  OXcarbazepine (TRILEPTAL) 150 MG tablet Take 1 tablet (150 mg total) by mouth 3 (three) times daily. 12/04/22   Sater, Pearletha Furl, MD  potassium chloride (KLOR-CON) 10 MEQ tablet Take 2 tablets (20 mEq total) by mouth daily. Take 4 pills daily for 3 days then continue taking as before 04/22/23   Burnadette Pop, MD  QUEtiapine (SEROQUEL) 25 MG tablet One po qHS and one po every day prn 02/12/23   Sater, Pearletha Furl, MD  rosuvastatin (CRESTOR) 20 MG tablet Take 20 mg by mouth daily.    [provider]  sodium chloride (OCEAN) 0.65 % SOLN nasal spray Place 1 spray into both nostrils as needed for  congestion. Patient taking differently: Place 1 spray into both nostrils daily as needed for congestion. 08/19/16   Rai, Ripudeep Kirtland Bouchard, MD  Teriflunomide 14 MG TABS Take 1 tablet (14 mg total) by mouth daily. HOLD until cleared by neurology outpatient to resume. 04/21/23   Burnadette Pop, MD  tolterodine (DETROL) 2 MG tablet Take 2 mg by mouth 2 (two) times daily.  05/09/18   [provider]    Physical Exam: Vitals:   04/24/23 1800 04/24/23 2100 04/24/23 2200 04/24/23 2330  BP: 136/80 (!) 140/66  131/82  Pulse: 71 77  90  Resp: 17 17  12   Temp: 97.6 F (36.4 C)  98.1 F (36.7 C)  TempSrc: Oral  Oral   SpO2: 100% 100%  97%  Weight:      Height:       Constitutional: NAD, calm, comfortable, obese female laying at incline in bed Eyes: lids and conjunctivae normal ENMT: Mucous membranes are moist.  Neck: normal, supple Respiratory: clear to auscultation bilaterally, no wheezing, no crackles. Normal respiratory effort. No accessory muscle use.  Cardiovascular: Regular rate and rhythm, no murmurs / rubs / gallops. No extremity edema.  Right sided chest port in place. Abdomen: no tenderness, soft. Bowel sounds positive.  Musculoskeletal: no clubbing / cyanosis. No joint deformity upper and lower extremities. Normal muscle tone.  Skin: no rashes, lesions, ulcers. No induration Neurologic: CN 2-12 grossly intact. Sensation intact, Quadriplegia-Only able to lift right UE against gravity. Unable to do hand grip.   Psychiatric: alert and oriented only to self and had to correct herself on place Data Reviewed:  See HPI  Assessment and Plan: * Acute encephalopathy -suspect secondary to AKI and polypharmacy. Baseline on current Cr clearance pt exceeds Baclofen dose -pt and husband agree to trial cutting Baclofen down to 10mg  QID rather than total of 60mg   -hold seroquel, dantrolene and Trileptal overnight  -will receive continuous IV fluid overnight for AKI  Acute kidney injury  superimposed on chronic kidney disease (HCC) - AKI on CKD 3A-baseline creatinine ranged from 1-1.1 - Creatinine 1.36 on presentation. Pt is on BID Lasix and has been taking ibuprofen more frequently. Will hold Lasix overnight. -Keep on continuous IV fluid overnight -renally dose all medications  Obstructive sleep apnea - Does not use CPAP - Awaiting in lab titration for home  Obesity (BMI 30-39.9) BMI 33  Chronic diastolic CHF (congestive heart failure) (HCC) -appears euvolemic on exam  Ulcerative colitis (HCC) - On mesalamine, Amitiza  Hypokalemia -replaced   Multiple sclerosis (HCC) Spastic quadriparesis - Wheelchair-bound at baseline -takes baclofen, dantrolene, Trileptal at home -will decrease Baclofen to 10mg  QID      Advance Care Planning:   Code Status: Full Code   Consults: none  Family Communication: husband at bedside  Severity of Illness: The appropriate patient status for this patient is OBSERVATION. Observation status is judged to be reasonable and necessary in order to provide the required intensity of service to ensure the patient's safety. The patient's presenting symptoms, physical exam findings, and initial radiographic and laboratory data in the context of their medical condition is felt to place them at decreased risk for further clinical deterioration. Furthermore, it is anticipated that the patient will be medically stable for discharge from the hospital within 2 midnights of admission.   Author: Anselm Jungling, DO 04/25/2023 1:08 AM  For on call review [email or web address].

## 2023-04-25 NOTE — Discharge Instructions (Signed)
Vanessa Short,  You were in the hospital with mental confusion. This appears to be related to your medications. Your regimen has been adjusted. Your Baclofen dose was decreased. You also appear to have been dehydrated which may have been caused by your lasix use; we discussed decreasing the dose and following up with your PCP.

## 2023-04-25 NOTE — Progress Notes (Incomplete)
PROGRESS NOTE    Vanessa Short  FIE:[id number] DOB: 1965-12-11 DOA: 04/24/2023 PCP: Richmond Campbell., [name]   Brief Narrative: No notes on file   Assessment and Plan:  Acute toxic encephalopathy Presumed secondary to Baclofen dose in setting of renal function impairment and/or polypharmacy. Baclofen dose decreased. *** held on admission. -***  AKI on CKD stage *** Baseline creatinine of 0.9-1. Creatinine up to 1.36 on admission. Creatinine down to 1.26.  OSA Patient is not on CPAP as an outpatient.  Chronic diastolic heart failure Stable. Euvolemic.  Ulcerative colitis -Continue mesalamine and Amitiza  Hypokalemia Potassium of 3.3 on admission. Resolved with repletion***.  Multiple sclerosis Spastic quadriparesis Patient is wheelchair bound at baseline. Patient is on Baclofen, dantrolene, and Trileptal. Baclofen 15 mg QID*** decreased secondary to concern for overmedication in setting of renal function. -Continue Baclofen 10 mg QID -Continue ***  Obesity Estimated body mass index is 33.78 kg/m as calculated from the following:   Height as of this encounter: 5\' 3"  (1.6 m).   Weight as of this encounter: 86.5 kg.   DVT prophylaxis: enoxaparin (LOVENOX) injection 40 mg Start: 04/25/23 1000 Code Status:   Code Status: Full Code Family Communication: *** Disposition Plan: ***   Consultants:  None  Procedures:  None  Antimicrobials: None    Subjective: ***  Objective: BP (!) 165/73 (BP Location: Right Arm)   Pulse 78   Temp 98.4 F (36.9 C) (Oral)   Resp 17   Ht 5\' 3"  (1.6 m)   Wt 86.5 kg   SpO2 100%   BMI 33.78 kg/m   Examination:  General exam: Appears calm and comfortable *** Respiratory system: Clear to auscultation. Respiratory effort normal. Cardiovascular system: S1 & S2 heard, RRR. No murmurs, rubs, gallops or clicks. Gastrointestinal system: Abdomen is nondistended, soft and nontender. No organomegaly or masses felt. Normal  bowel sounds heard. Central nervous system: Alert and oriented. No focal neurological deficits. Musculoskeletal: No edema. No calf tenderness Skin: No cyanosis. No rashes Psychiatry: Judgement and insight appear normal. Mood & affect appropriate.    Data Reviewed: I have personally reviewed following labs and imaging studies  CBC Lab Results  Component Value Date   WBC 8.4 04/25/2023   RBC 2.97 (L) 04/25/2023   HGB 8.7 (L) 04/25/2023   HCT 29.2 (L) 04/25/2023   MCV 98.3 04/25/2023   MCH 29.3 04/25/2023   PLT 171 04/25/2023   MCHC 29.8 (L) 04/25/2023   RDW 17.8 (H) 04/25/2023   LYMPHSABS 0.9 04/24/2023   MONOABS 1.0 04/24/2023   EOSABS 0.3 04/24/2023   BASOSABS 0.1 04/24/2023     Last metabolic panel Lab Results  Component Value Date   NA 142 04/25/2023   K 3.6 04/25/2023   CL 112 (H) 04/25/2023   CO2 22 04/25/2023   BUN 18 04/25/2023   CREATININE 1.26 (H) 04/25/2023   GLUCOSE 105 (H) 04/25/2023   GFRNONAA 49 (L) 04/25/2023   GFRAA 79 03/16/2020   CALCIUM 8.2 (L) 04/25/2023   PHOS 2.4 (L) 08/06/2016   PROT 6.7 04/24/2023   ALBUMIN 3.1 (L) 04/24/2023   LABGLOB 2.0 03/15/2023   AGRATIO 1.7 01/10/2023   BILITOT 0.4 04/24/2023   ALKPHOS 131 (H) 04/24/2023   AST 24 04/24/2023   ALT 35 04/24/2023   ANIONGAP 8 04/25/2023    GFR: Estimated Creatinine Clearance: 50.7 mL/min (A) (by C-G formula based on SCr of 1.26 mg/dL (H)).  Recent Results (from the past 240 hour(s))  Culture, blood (  routine x 2)     Status: None   Collection Time: 04/17/23  2:39 PM   Specimen: BLOOD  Result Value Ref Range Status   Specimen Description BLOOD RIGHT ANTECUBITAL  Final   Special Requests   Final    BOTTLES DRAWN AEROBIC AND ANAEROBIC Blood Culture results may not be optimal due to an excessive volume of blood received in culture bottles   Culture   Final    NO GROWTH 5 DAYS Performed at [facility], [address], [place], [place] [address]    Report Status 04/22/2023  FINAL  Final  Culture, blood (routine x 2)     Status: None   Collection Time: 04/17/23  2:45 PM   Specimen: BLOOD RIGHT FOREARM  Result Value Ref Range Status   Specimen Description BLOOD RIGHT FOREARM  Final   Special Requests   Final    BOTTLES DRAWN AEROBIC AND ANAEROBIC Blood Culture results may not be optimal due to an excessive volume of blood received in culture bottles   Culture   Final    NO GROWTH 5 DAYS Performed at [facility], [address] [place], [place] [address]    Report Status 04/22/2023 FINAL  Final  Urine Culture     Status: None   Collection Time: 04/17/23  4:04 PM   Specimen: Urine, Clean Catch  Result Value Ref Range Status   Specimen Description URINE, CLEAN CATCH  Final   Special Requests NONE Reflexed from [id number]  Final   Culture   Final    NO GROWTH Performed at [facility], [address], [place], [place] [address]    Report Status 04/18/2023 FINAL  Final  Gastrointestinal Panel by PCR , Stool     Status: None   Collection Time: 04/19/23  6:15 PM   Specimen: Stool  Result Value Ref Range Status   Campylobacter species NOT DETECTED NOT DETECTED Final   Plesimonas shigelloides NOT DETECTED NOT DETECTED Final   Salmonella species NOT DETECTED NOT DETECTED Final   Yersinia enterocolitica NOT DETECTED NOT DETECTED Final   Vibrio species NOT DETECTED NOT DETECTED Final   Vibrio cholerae NOT DETECTED NOT DETECTED Final   Enteroaggregative E coli (EAEC) NOT DETECTED NOT DETECTED Final   Enteropathogenic E coli (EPEC) NOT DETECTED NOT DETECTED Final   Enterotoxigenic E coli (ETEC) NOT DETECTED NOT DETECTED Final   Shiga like toxin producing E coli (STEC) NOT DETECTED NOT DETECTED Final   Shigella/Enteroinvasive E coli (EIEC) NOT DETECTED NOT DETECTED Final   Cryptosporidium NOT DETECTED NOT DETECTED Final   Cyclospora cayetanensis NOT DETECTED NOT DETECTED Final   Entamoeba histolytica NOT DETECTED NOT DETECTED Final   Giardia lamblia  NOT DETECTED NOT DETECTED Final   Adenovirus F40/41 NOT DETECTED NOT DETECTED Final   Astrovirus NOT DETECTED NOT DETECTED Final   Norovirus GI/GII NOT DETECTED NOT DETECTED Final   Rotavirus A NOT DETECTED NOT DETECTED Final   Sapovirus (I, II, IV, and V) NOT DETECTED NOT DETECTED Final    Comment: Performed at [facility], [address] [place], [place] [address]      Radiology Studies: CT Head Wo Contrast  Result Date: 04/24/2023 CLINICAL DATA:  Altered mental status EXAM: CT HEAD WITHOUT CONTRAST TECHNIQUE: Contiguous axial images were obtained from the base of the skull through the vertex without intravenous contrast. RADIATION DOSE REDUCTION: This exam was performed according to the departmental dose-optimization program which includes automated exposure control,  adjustment of the mA and/or kV according to patient size and/or use of iterative reconstruction technique. COMPARISON:  04/17/2023 FINDINGS: Brain: No mass, hemorrhage or extra-axial collection. There is multifocal hypoattenuation within the corona radiata bilaterally, unchanged. Periventricular white matter hypoattenuation. Mild volume loss. Left middle cranial fossa arachnoid cyst. Vascular: No hyperdense vessel or unexpected calcification. Skull: Normal. Negative for fracture or focal lesion. Sinuses/Orbits: No acute finding. Other: None. IMPRESSION: No acute intracranial abnormality. Electronically Signed   By: Deatra Robinson M.D.   On: 04/24/2023 23:53      LOS: 0 days    Jacquelin Hawking, MD [facility] 04/25/2023, 8:38 AM   If 7PM-7AM, please contact night-coverage [email or web address]

## 2023-04-26 ENCOUNTER — Ambulatory Visit: Payer: Medicare Other | Admitting: Neurology

## 2023-04-26 NOTE — Hospital Course (Signed)
Vanessa Short is a 57 y.o. female with a history of multiple sclerosis, ulcerative colitis, CKD stage IIIa, hypertension, hyperlipidemia, OSA.  Patient presented secondary to lethargy and somnolence with concern for polypharmacy. Patient was also found to have dehydration and resultant AKI. Baclofen dose decreased in setting of AKI with improvement of mentation. Baclofen dose adjusted on discharge. Lasix dose also adjusted secondary to concern Lasix is contributing to development of dehydration.

## 2023-05-11 ENCOUNTER — Ambulatory Visit: Payer: Medicare Other | Admitting: Neurology

## 2023-05-15 ENCOUNTER — Encounter: Payer: Self-pay | Admitting: Neurology

## 2023-05-15 ENCOUNTER — Ambulatory Visit (INDEPENDENT_AMBULATORY_CARE_PROVIDER_SITE_OTHER): Payer: Medicare Other | Admitting: Neurology

## 2023-05-15 VITALS — BP 124/65 | HR 73 | Ht 63.0 in

## 2023-05-15 DIAGNOSIS — G4719 Other hypersomnia: Secondary | ICD-10-CM

## 2023-05-15 DIAGNOSIS — G4731 Primary central sleep apnea: Secondary | ICD-10-CM

## 2023-05-15 DIAGNOSIS — G4733 Obstructive sleep apnea (adult) (pediatric): Secondary | ICD-10-CM | POA: Diagnosis not present

## 2023-05-15 DIAGNOSIS — G8250 Quadriplegia, unspecified: Secondary | ICD-10-CM

## 2023-05-15 DIAGNOSIS — N390 Urinary tract infection, site not specified: Secondary | ICD-10-CM

## 2023-05-15 DIAGNOSIS — G35 Multiple sclerosis: Secondary | ICD-10-CM

## 2023-05-15 DIAGNOSIS — Z79899 Other long term (current) drug therapy: Secondary | ICD-10-CM

## 2023-05-15 DIAGNOSIS — R440 Auditory hallucinations: Secondary | ICD-10-CM

## 2023-05-15 NOTE — Progress Notes (Signed)
[facility]  [name] DOB: 02/19/1966  REFERRING CLINICIAN: Carlos American, [facility] HISTORY FROM: patient    REASON FOR VISIT: MS   HISTORICAL  CHIEF COMPLAINT:  Chief Complaint  Patient presents with   Follow-up    Pt in room 10, husband in room. Here for MS follow up. Pt reports good day and bad days. Reports no concerns.     HISTORY OF PRESENT ILLNESS:  Vanessa Short is a 57 y.o. woman diagnosed with relapsing remitting MS in 1989.   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  She had previously been on Vumerity and lymphocyte counts have remained low for over a year after it was discontinued.  Due to multiple infections, I would be very concerned about her going on an anti-CD20 agent.  Currently, strength is 0-1/5 in the legs and 2/5 in the left arm and 2+ to 4 -/5 in the right arm. She has urinary incontinence but has some voluntary control at times.   Vision is symmetric..  For spasticity has been a problem and she is on Dantrium 50 mg 3 times a day and baclofen 10 mg 4 times a day.   She notes fatigue.  She denies depression at this time.  Cognition is usually fine but has had issues when she has an infection.    She takes fluoxetine 60 mg daily.    She had multiple episodes of unresponsiveness lasting 1-2 seconds.   She went to the ED and was found to be dehydrated and to have elevated creatinine.   The ARF could have made her baclofen concentration higher in the blood and the dose was reduced from 20 to 10 mg 4 times a day..    She also had elevated LFTs as well.  Several medications were changed dose.    She has recovered from her MRSA nasal abscess requiring hospitalization in June 2024.  That had been associated with hallucinations such as seeing things flying or seeing things in the ceiling that were not there.  She was also hearing voices.   They were nonthreatening.  Also during that time she had had episodes of decreased responsiveness but an EEG showed normal activity.  Hallucinations stopped after the her infection had cleared.  She remains bed and chair bound since she broke her right leg and left shoulder.  She is no longer able to assist with transfers.  She has a multi functional power wheelchair.  She has a lot of spasticity in the legs.  Baclofen has helped her some.  The dose was reduced recently due to her elevated creatinine..  She has had long QT on some EKG  She snores and has witnessed OSA.    She is sleepy.   HST sin July showed severe sleep apnea with much of the night and Cheyne-Stokes respiration. Due to the combination of obstructive and central sleep apnea and neurologic dysfunction, there is a high likelihood that she will need advanced BiPAP mode such as ST or ASV .   Bipap titration eeded to be rescheduled as she hasd COvid-19.     EPWORTH SLEEPINESS SCALE  On a scale of 0 - 3 what is the chance of dozing:  Sitting and Reading:   3 Watching TV:    3 Sitting inactive in a public place: 1 Passenger in car for one hour: 3 Lying down to rest in the afternoon: 3 Sitting  and talking to someone: 0 Sitting quietly after lunch:  2 In a car, stopped in traffic:  N/a  Total (out of 24):   15    MS: She was diagnosed with MS 1989.  Unfortunately, she has had progressive difficulties with weakness in both legs and in the left arm more than the right arm..  She has not had any recent exacerbation.  Due to her low lymphocyte count her Vumerity is currently being held.    MS History:  She presented with a Lhermite's syndrome in 1988 or 1989.   MRIs of the head and neck were performed. They showed lesions consistent with the diagnosis of multiple sclerosis. Lumbar puncture was not necessary. Over the next 15 years, she would get some exacerbations and have courses of steroids. However, a disease modifying therapy  was not prescribed. By 2004, she had difficulty with her gait and would often have to lean on somebody for support.  She started Copaxone that year and stayed on it for about one half years. She stopped due to a lot of itching. She then switched to Rebif. While on Copaxone Rebif she continues to have exacerbations and further difficulties with her gait. About 2012 she switched to Aubagio. She tolerated Aubagio well but progressed.  Tysabri was started in February 2019 after relapse.  Unfortunately she converted from JCV negative to positive.  She felt she did well on Tysabri with some slowing of her progression as well as improved fatigue.  Due to the elevated JCV, she switched to Vumerity in 2021.  MRI 10/11/2016 showed:  Confluent white matter disease throughout the cerebral hemispheres consistent with chronic multiple sclerosis. Several foci show restricted diffusion, but no contrast enhancement.  No new lesions compared to January 2018.  MRI cervical spine 08/16/2016 showed:  C5 myelomalacia, additional spinal cord plaques consistent with chronic demyelination, relatively similar though limited assessment due to patient motion.  MRI brain 08/16/2016 showed:  Numerous foci of reduced diffusion most consistent with hyperacute/active demyelination, less likely infection/septic emboli. Severe chronic demyelination including supra and infratentorial white matter, cortex and deep gray nuclei lesions.  Moderate parenchymal brain volume loss for age.  REVIEW OF SYSTEMS:  Constitutional: No fevers, chills, sweats, or change in appetite.  Has fatigue and poor slep Eyes: see above.   No eye pain Ear, nose and throat: No hearing loss, ear pain, nasal congestion, sore throat Cardiovascular: No chest pain, palpitations Respiratory:  No shortness of breath at rest or with exertion.   No wheezes GastrointestinaI: Has UC - doing well.   No nausea, vomiting, diarrhea, abdominal pain, fecal  incontinence Genitourinary:  see above Musculoskeletal:  No neck pain, back pain Integumentary: No rash, pruritus, skin lesions Neurological: as above Psychiatric: as above. Endocrine: No palpitations, diaphoresis, change in appetite, change in weigh or increased thirst Hematologic/Lymphatic:  No anemia, purpura, petechiae. Allergic/Immunologic: No itchy/runny eyes, nasal congestion, recent allergic reactions, rashes  ALLERGIES: Allergies  Allergen Reactions   Nitrofurantoin Other (See Comments)    syncope   Oxycodone-Acetaminophen Other (See Comments)   Risperidone And Related Other (See Comments)    Became very lethargic   Penicillins Rash    HOME MEDICATIONS: Outpatient Medications Prior to Visit  Medication Sig Dispense Refill   acyclovir (ZOVIRAX) 400 MG tablet Take 400 mg by mouth daily. Take every day per patient  0   aspirin EC 81 MG tablet Take 81 mg by mouth daily.     b complex vitamins tablet Take 1 tablet  by mouth daily.      baclofen (LIORESAL) 10 MG tablet Take 1 tablet (10 mg total) by mouth 4 (four) times daily. 1 to 2 po qid up to 8/day     cholecalciferol (VITAMIN D) 1000 UNITS tablet Take 1,000 Units by mouth daily.      dantrolene (DANTRIUM) 50 MG capsule Take 1 capsule (50 mg total) by mouth 3 (three) times daily as needed.     famotidine (PEPCID) 40 MG tablet Take 40 mg by mouth 2 (two) times daily.     FLUoxetine (PROZAC) 20 MG capsule Take 20 mg by mouth 3 (three) times daily.     folic acid (FOLVITE) 400 MCG tablet Take 800 mcg by mouth daily.     furosemide (LASIX) 40 MG tablet Take 0.5 tablets (20 mg total) by mouth 2 (two) times daily.     labetalol (NORMODYNE) 100 MG tablet TAKE 1/2 TABLET BY MOUTH DAILY 45 tablet 2   lidocaine-prilocaine (EMLA) cream Apply one inch before port-a cath access prn (Patient taking differently: Apply 1 Application topically daily as needed (port access).) 30 g 3   lubiprostone (AMITIZA) 8 MCG capsule Take 8-16 mcg by  mouth 2 (two) times daily with a meal. Take  in the morning and 16 mcg in the afternoon.  Currently doing 8 mcg daily for now.     mesalamine (APRISO) 0.375 g 24 hr capsule Take 375 mg by mouth QID.     Multiple Vitamin (MULTIVITAMIN) tablet Take 1 tablet by mouth daily.     OXcarbazepine (TRILEPTAL) 150 MG tablet Take 1 tablet (150 mg total) by mouth 3 (three) times daily. 270 tablet 3   potassium chloride (KLOR-CON) 10 MEQ tablet Take 2 tablets (20 mEq total) by mouth daily. Take 4 pills daily for 3 days then continue taking as before 30 tablet 0   QUEtiapine (SEROQUEL) 25 MG tablet One po qHS and one po every day prn 60 tablet 11   rosuvastatin (CRESTOR) 20 MG tablet Take 40 mg by mouth daily.     sodium chloride (OCEAN) 0.65 % SOLN nasal spray Place 1 spray into both nostrils as needed for congestion. (Patient taking differently: Place 1 spray into both nostrils daily as needed for congestion.)  0   tolterodine (DETROL) 2 MG tablet Take 2 mg by mouth 2 (two) times daily.   1   Teriflunomide 14 MG TABS Take 1 tablet (14 mg total) by mouth daily. HOLD until cleared by neurology outpatient to resume. (Patient not taking: Reported on 04/25/2023)     No facility-administered medications prior to visit.    PAST MEDICAL HISTORY: Past Medical History:  Diagnosis Date   Headache    Herpes genitalis in women    History of kidney stones    Hypertension    Kidney stones    Movement disorder    Multiple sclerosis (HCC)    Neuropathy    Oral herpes    Ulcerative colitis (HCC)    Vision abnormalities     PAST SURGICAL HISTORY: Past Surgical History:  Procedure Laterality Date   ENDOMETRIAL ABLATION  10/10/2017   KIDNEY STONE SURGERY     PORTACATH PLACEMENT N/A 10/16/2017   Procedure: ULTRA SOUND GUIDED INSERTION PORT-A-CATH ERAS PATHWAY;  Surgeon: Rodman Pickle, MD;  Location: [facility];  Service: General;  Laterality: N/A;    FAMILY HISTORY: Family History  Problem Relation  Age of Onset   Dementia Mother    Hypertension Father  Hyperlipidemia Father    Diabetes Father    Heart disease Father    Breast cancer Neg Hx     SOCIAL HISTORY:  Social History   Socioeconomic History   Marital status: Married    Spouse name: Angelee Dreis   Number of children: Not on file   Years of education: Not on file   Highest education level: Not on file  Occupational History   Not on file  Tobacco Use   Smoking status: Never   Smokeless tobacco: Never  Vaping Use   Vaping status: Never Used  Substance and Sexual Activity   Alcohol use: Yes    Comment: occasional glass of wine   Drug use: No   Sexual activity: Not on file  Other Topics Concern   Not on file  Social History Narrative   ** Merged History Encounter **       Social Determinants of Health   Financial Resource Strain: Not on file  Food Insecurity: No Food Insecurity (04/25/2023)   Hunger Vital Sign    Worried About Running Out of Food in the Last Year: Never true    Ran Out of Food in the Last Year: Never true  Transportation Needs: No Transportation Needs (04/25/2023)   PRAPARE - Administrator, Civil Service (Medical): No    Lack of Transportation (Non-Medical): No  Physical Activity: Not on file  Stress: No Stress Concern Present (09/24/2020)   Received from [facility] - Occupational Stress Questionnaire    Feeling of Stress : Only a little  Social Connections: Unknown (12/16/2021)   Received from [facility]   Social Network    Social Network: Not on file  Intimate Partner Violence: Not At Risk (04/25/2023)   Humiliation, Afraid, Rape, and Kick questionnaire    Fear of Current or Ex-Partner: No    Emotionally Abused: No    Physically Abused: No    Sexually Abused: No     PHYSICAL EXAM  Vitals:   05/15/23 1124  BP: 124/65  Pulse: 73  Height: 5\' 3"  (1.6 m)    Weight = 175 pounds.    Patient is right handed.     Body mass index is 33.78 kg/m.   General: The patient is well-developed and well-nourished and in no acute distress.  She is in a power wheelchair    Skin/extremities: Extremities are without rash.  She has edema at the ankles  Neurologic Exam  Mental status: The patient is alert and oriented.  Focus and attention was reduced.  Speech is normal.  Cranial nerves: Extraocular movements are full.   Facial strength and sensation is normal.  Trapezius strength is normal.  There is no dysarthria.  No obvious hearing deficits are noted.  Motor:  Muscle bulk is normal .  Muscle tone is mildly increased in the legs, right greater than left.  She has increased muscle tone in the arms, left >>right   She has 0/5 strength in the legs.. Strength is 1 to 2-/5 in the left arm and 2+ to 3/5 in the right arm proximal arm and 4/5 finger and wrist flexion.  Able to touch face but without much strength.  She is unable to write.  She is able to operate her joystick on the right..   Sensory: She appears to have symmetric vibration sensation in the arms and reduced vibration sensation at the knees.  Gait and station: She  is unable to stand or walk. Reflexes: She has increased reflexes in her legs.    DIAGNOSTIC DATA (LABS, IMAGING, TESTING) - I reviewed patient records, labs, notes, testing and imaging myself where available.   Recent Results (from the past 2160 hour(s))  CMP     Status: Abnormal   Collection Time: 03/15/23 11:59 AM  Result Value Ref Range   Glucose 124 (H) 70 - 99 mg/dL   BUN 17 6 - 24 mg/dL   Creatinine, Ser 1.61 0.57 - 1.00 mg/dL   eGFR 68 >09 UE/AVW/0.98   BUN/Creatinine Ratio 18 9 - 23   Sodium 146 (H) 134 - 144 mmol/L   Potassium 3.7 3.5 - 5.2 mmol/L   Chloride 110 (H) 96 - 106 mmol/L   CO2 21 20 - 29 mmol/L   Calcium 8.9 8.7 - 10.2 mg/dL   Total Protein 5.8 (L) 6.0 - 8.5 g/dL   Albumin 3.8 3.8 - 4.9 g/dL   Globulin, Total 2.0 1.5 - 4.5 g/dL    Bilirubin Total 0.2 0.0 - 1.2 mg/dL   Alkaline Phosphatase 163 (H) 44 - 121 IU/L   AST 25 0 - 40 IU/L   ALT 31 0 - 32 IU/L  Lipid panel     Status: None   Collection Time: 03/15/23 11:59 AM  Result Value Ref Range   Cholesterol, Total 119 100 - 199 mg/dL   Triglycerides 119 0 - 149 mg/dL   HDL 43 >14 mg/dL   VLDL Cholesterol Cal 25 5 - 40 mg/dL   LDL Chol Calc (NIH) 51 0 - 99 mg/dL   Chol/HDL Ratio 2.8 0.0 - 4.4 ratio    Comment:                                   T. Chol/HDL Ratio                                             Men  Women                               1/2 Avg.Risk  3.4    3.3                                   Avg.Risk  5.0    4.4                                2X Avg.Risk  9.6    7.1                                3X Avg.Risk 23.4   11.0   CBC with Differential/Platelet     Status: Abnormal   Collection Time: 03/15/23 11:59 AM  Result Value Ref Range   WBC 7.1 3.4 - 10.8 x10E3/uL   RBC 3.84 3.77 - 5.28 x10E6/uL   Hemoglobin 11.2 11.1 - 15.9 g/dL   Hematocrit 78.2 95.6 - 46.6 %   MCV 92 79 - 97 fL   MCH 29.2 26.6 - 33.0 pg   MCHC  31.7 31.5 - 35.7 g/dL   RDW 44.0 34.7 - 42.5 %   Platelets 158 150 - 450 x10E3/uL   Neutrophils 74 Not Estab. %   Lymphs 9 Not Estab. %   Monocytes 12 Not Estab. %   Eos 4 Not Estab. %   Basos 1 Not Estab. %   Neutrophils Absolute 5.3 1.4 - 7.0 x10E3/uL   Lymphocytes Absolute 0.6 (L) 0.7 - 3.1 x10E3/uL   Monocytes Absolute 0.8 0.1 - 0.9 x10E3/uL   EOS (ABSOLUTE) 0.3 0.0 - 0.4 x10E3/uL   Basophils Absolute 0.1 0.0 - 0.2 x10E3/uL   Immature Granulocytes 0 Not Estab. %   Immature Grans (Abs) 0.0 0.0 - 0.1 x10E3/uL  Comprehensive metabolic panel     Status: Abnormal   Collection Time: 04/17/23 12:01 PM  Result Value Ref Range   Sodium 143 135 - 145 mmol/L   Potassium 3.2 (L) 3.5 - 5.1 mmol/L   Chloride 104 98 - 111 mmol/L   CO2 22 22 - 32 mmol/L   Glucose, Bld 140 (H) 70 - 99 mg/dL    Comment: Glucose reference range applies  only to samples taken after fasting for at least 8 hours.   BUN 38 (H) 6 - 20 mg/dL   Creatinine, Ser 9.56 (H) 0.44 - 1.00 mg/dL   Calcium 9.2 8.9 - 38.7 mg/dL   Total Protein 6.7 6.5 - 8.1 g/dL   Albumin 2.9 (L) 3.5 - 5.0 g/dL   AST 30 15 - 41 U/L   ALT 51 (H) 0 - 44 U/L   Alkaline Phosphatase 173 (H) 38 - 126 U/L   Total Bilirubin 0.7 0.3 - 1.2 mg/dL   GFR, Estimated 33 (L) >60 mL/min    Comment: (NOTE) Calculated using the CKD-EPI Creatinine Equation (2021)    Anion gap 17 (H) 5 - 15    Comment: Performed at [facility], [address] [place], [place] [address]  CBC with Differential     Status: Abnormal   Collection Time: 04/17/23 12:01 PM  Result Value Ref Range   WBC 11.3 (H) 4.0 - 10.5 K/uL   RBC 3.42 (L) 3.87 - 5.11 MIL/uL   Hemoglobin 9.6 (L) 12.0 - 15.0 g/dL   HCT 29.5 (L) 18.8 - 41.6 %   MCV 92.7 80.0 - 100.0 fL   MCH 28.1 26.0 - 34.0 pg   MCHC 30.3 30.0 - 36.0 g/dL   RDW 60.6 30.1 - 60.1 %   Platelets 263 150 - 400 K/uL   nRBC 0.0 0.0 - 0.2 %   Neutrophils Relative % 72 %   Neutro Abs 8.2 (H) 1.7 - 7.7 K/uL   Lymphocytes Relative 7 %   Lymphs Abs 0.8 0.7 - 4.0 K/uL   Monocytes Relative 16 %   Monocytes Absolute 1.8 (H) 0.1 - 1.0 K/uL   Eosinophils Relative 3 %   Eosinophils Absolute 0.3 0.0 - 0.5 K/uL   Basophils Relative 1 %   Basophils Absolute 0.1 0.0 - 0.1 K/uL   Immature Granulocytes 1 %   Abs Immature Granulocytes 0.12 (H) 0.00 - 0.07 K/uL    Comment: Performed at [facility], [address] [place], [place] [address]  hCG, serum, qualitative     Status: None   Collection Time: 04/17/23 12:01 PM  Result Value Ref Range   Preg, Serum NEGATIVE NEGATIVE    Comment:        THE SENSITIVITY OF THIS METHODOLOGY IS >10 mIU/mL. Performed at  [facility], [address], [place], [place] [address]   I-Stat Lactic Acid, ED     Status: None   Collection Time: 04/17/23 12:25 PM  Result Value Ref Range   Lactic Acid, Venous 1.6 0.5 -  1.9 mmol/L  Culture, blood (routine x 2)     Status: None   Collection Time: 04/17/23  2:39 PM   Specimen: BLOOD  Result Value Ref Range   Specimen Description BLOOD RIGHT ANTECUBITAL    Special Requests      BOTTLES DRAWN AEROBIC AND ANAEROBIC Blood Culture results may not be optimal due to an excessive volume of blood received in culture bottles   Culture      NO GROWTH 5 DAYS Performed at [facility], [address], [place], [place] [address]    Report Status 04/22/2023 FINAL   Culture, blood (routine x 2)     Status: None   Collection Time: 04/17/23  2:45 PM   Specimen: BLOOD RIGHT FOREARM  Result Value Ref Range   Specimen Description BLOOD RIGHT FOREARM    Special Requests      BOTTLES DRAWN AEROBIC AND ANAEROBIC Blood Culture results may not be optimal due to an excessive volume of blood received in culture bottles   Culture      NO GROWTH 5 DAYS Performed at [facility], [address] [place], [place] [address]    Report Status 04/22/2023 FINAL   I-Stat Lactic Acid, ED     Status: None   Collection Time: 04/17/23  2:46 PM  Result Value Ref Range   Lactic Acid, Venous 1.1 0.5 - 1.9 mmol/L  Urinalysis, w/ Reflex to Culture (Infection Suspected) -Urine, Clean Catch     Status: Abnormal   Collection Time: 04/17/23  4:04 PM  Result Value Ref Range   Specimen Source URINE, CLEAN CATCH    Color, Urine YELLOW YELLOW   APPearance CLOUDY (A) CLEAR   Specific Gravity, Urine 1.011 1.005 - 1.030   pH 5.0 5.0 - 8.0   Glucose, UA NEGATIVE NEGATIVE mg/dL   Hgb urine dipstick LARGE (A) NEGATIVE   Bilirubin Urine NEGATIVE NEGATIVE   Ketones, ur NEGATIVE NEGATIVE mg/dL   Protein, ur 30 (A) NEGATIVE mg/dL   Nitrite NEGATIVE NEGATIVE   Leukocytes,Ua LARGE (A) NEGATIVE   RBC / HPF >50 0 - 5 RBC/hpf   WBC, UA >50 0 - 5 WBC/hpf    Comment:        Reflex urine culture not performed if WBC <=10, OR if Squamous epithelial cells >5. If Squamous epithelial cells  >5 suggest recollection.    Bacteria, UA RARE (A) NONE SEEN   Squamous Epithelial / HPF 0-5 0 - 5 /HPF   Mucus PRESENT    Hyaline Casts, UA PRESENT     Comment: Performed at [facility], [address] [place], [place] [address]  Urine Culture     Status: None   Collection Time: 04/17/23  4:04 PM   Specimen: Urine, Clean Catch  Result Value Ref Range   Specimen Description URINE, CLEAN CATCH    Special Requests NONE Reflexed from [id number]    Culture      NO GROWTH Performed at [facility], [address], [place], [place] [address]    Report Status 04/18/2023 FINAL   CBC     Status: Abnormal   Collection Time: 04/18/23  8:52 AM  Result Value Ref Range   WBC  14.6 (H) 4.0 - 10.5 K/uL   RBC 3.10 (L) 3.87 - 5.11 MIL/uL   Hemoglobin 9.2 (L) 12.0 - 15.0 g/dL   HCT 16.1 (L) 09.6 - 04.5 %   MCV 91.6 80.0 - 100.0 fL   MCH 29.7 26.0 - 34.0 pg   MCHC 32.4 30.0 - 36.0 g/dL   RDW 40.9 (H) 81.1 - 91.4 %   Platelets 209 150 - 400 K/uL   nRBC 1.4 (H) 0.0 - 0.2 %    Comment: Performed at [facility], [address], [place], [place] [address]  Comprehensive metabolic panel     Status: Abnormal   Collection Time: 04/18/23  8:52 AM  Result Value Ref Range   Sodium 136 135 - 145 mmol/L   Potassium 4.2 3.5 - 5.1 mmol/L   Chloride 98 98 - 111 mmol/L   CO2 25 22 - 32 mmol/L   Glucose, Bld 95 70 - 99 mg/dL    Comment: Glucose reference range applies only to samples taken after fasting for at least 8 hours.   BUN 12 6 - 20 mg/dL   Creatinine, Ser 6.21 0.44 - 1.00 mg/dL   Calcium 8.9 8.9 - 30.8 mg/dL   Total Protein 7.0 6.5 - 8.1 g/dL   Albumin 3.6 3.5 - 5.0 g/dL   AST 657 (H) 15 - 41 U/L   ALT 144 (H) 0 - 44 U/L   Alkaline Phosphatase 49 38 - 126 U/L   Total Bilirubin 4.0 (H) 0.3 - 1.2 mg/dL   GFR, Estimated >84 >69 mL/min    Comment: (NOTE) Calculated using the CKD-EPI Creatinine Equation (2021)    Anion gap 13 5 - 15    Comment: Performed at [facility], [address] [place], [place] [address]  Comprehensive metabolic panel     Status: Abnormal   Collection Time: 04/19/23  4:20 AM  Result Value Ref Range   Sodium 143 135 - 145 mmol/L    Comment: REPEATED TO VERIFY   Potassium 3.6 3.5 - 5.1 mmol/L   Chloride 113 (H) 98 - 111 mmol/L   CO2 21 (L) 22 - 32 mmol/L   Glucose, Bld 114 (H) 70 - 99 mg/dL    Comment: Glucose reference range applies only to samples taken after fasting for at least 8 hours.   BUN 23 (H) 6 - 20 mg/dL   Creatinine, Ser 8.41 (H) 0.44 - 1.00 mg/dL   Calcium 8.9 8.9 - 32.4 mg/dL   Total Protein 5.7 (L) 6.5 - 8.1 g/dL   Albumin 2.5 (L) 3.5 - 5.0 g/dL   AST 16 15 - 41 U/L   ALT 31 0 - 44 U/L   Alkaline Phosphatase 150 (H) 38 - 126 U/L   Total Bilirubin 0.7 0.3 - 1.2 mg/dL   GFR, Estimated 43 (L) >60 mL/min    Comment: (NOTE) Calculated using the CKD-EPI Creatinine Equation (2021)    Anion gap 9 5 - 15    Comment: Performed at [facility], [address] [place], [place] [address]  CBC     Status: Abnormal   Collection Time: 04/19/23  4:20 AM  Result Value Ref Range   WBC 10.6 (H) 4.0 - 10.5 K/uL   RBC 2.88 (L) 3.87 - 5.11 MIL/uL   Hemoglobin 8.2 (L) 12.0 - 15.0 g/dL   HCT 72.5 (L) 36.6 - 44.0 %   MCV 94.8 80.0 - 100.0 fL   MCH 28.5 26.0 - 34.0  pg   MCHC 30.0 30.0 - 36.0 g/dL   RDW 21.3 08.6 - 57.8 %   Platelets 245 150 - 400 K/uL   nRBC 0.0 0.0 - 0.2 %    Comment: Performed at [facility], [address], [place], [place] [address]  Gastrointestinal Panel by PCR , Stool     Status: None   Collection Time: 04/19/23  6:15 PM   Specimen: Stool  Result Value Ref Range   Campylobacter species NOT DETECTED NOT DETECTED   Plesimonas shigelloides NOT DETECTED NOT DETECTED   Salmonella species NOT DETECTED NOT DETECTED   Yersinia enterocolitica NOT DETECTED NOT DETECTED   Vibrio species NOT DETECTED NOT DETECTED   Vibrio cholerae NOT DETECTED NOT DETECTED   Enteroaggregative E coli (EAEC) NOT  DETECTED NOT DETECTED   Enteropathogenic E coli (EPEC) NOT DETECTED NOT DETECTED   Enterotoxigenic E coli (ETEC) NOT DETECTED NOT DETECTED   Shiga like toxin producing E coli (STEC) NOT DETECTED NOT DETECTED   Shigella/Enteroinvasive E coli (EIEC) NOT DETECTED NOT DETECTED   Cryptosporidium NOT DETECTED NOT DETECTED   Cyclospora cayetanensis NOT DETECTED NOT DETECTED   Entamoeba histolytica NOT DETECTED NOT DETECTED   Giardia lamblia NOT DETECTED NOT DETECTED   Adenovirus F40/41 NOT DETECTED NOT DETECTED   Astrovirus NOT DETECTED NOT DETECTED   Norovirus GI/GII NOT DETECTED NOT DETECTED   Rotavirus A NOT DETECTED NOT DETECTED   Sapovirus (I, II, IV, and V) NOT DETECTED NOT DETECTED    Comment: Performed at [facility], [address] [place], [place] [address]  CBC     Status: Abnormal   Collection Time: 04/20/23  3:10 AM  Result Value Ref Range   WBC 11.1 (H) 4.0 - 10.5 K/uL   RBC 2.90 (L) 3.87 - 5.11 MIL/uL   Hemoglobin 8.3 (L) 12.0 - 15.0 g/dL   HCT 13.2 (L) 44.0 - 10.2 %   MCV 94.1 80.0 - 100.0 fL   MCH 28.6 26.0 - 34.0 pg   MCHC 30.4 30.0 - 36.0 g/dL   RDW 72.5 36.6 - 44.0 %   Platelets 225 150 - 400 K/uL   nRBC 0.0 0.0 - 0.2 %    Comment: Performed at [facility], [address], [place], [place] [address]  Basic metabolic panel     Status: Abnormal   Collection Time: 04/20/23  3:10 AM  Result Value Ref Range   Sodium 142 135 - 145 mmol/L   Potassium 3.4 (L) 3.5 - 5.1 mmol/L   Chloride 110 98 - 111 mmol/L   CO2 20 (L) 22 - 32 mmol/L   Glucose, Bld 148 (H) 70 - 99 mg/dL    Comment: Glucose reference range applies only to samples taken after fasting for at least 8 hours.   BUN 21 (H) 6 - 20 mg/dL   Creatinine, Ser 5.95 (H) 0.44 - 1.00 mg/dL   Calcium 8.7 (L) 8.9 - 10.3 mg/dL   GFR, Estimated 54 (L) >60 mL/min    Comment: (NOTE) Calculated using the CKD-EPI Creatinine Equation (2021)    Anion gap 12 5 - 15    Comment: Performed at [facility], [address], [place], [place] [address]  Basic metabolic panel     Status: Abnormal   Collection Time: 04/21/23  3:11 AM  Result Value Ref Range   Sodium 139 135 - 145 mmol/L   Potassium 3.4 (L) 3.5 - 5.1 mmol/L   Chloride 111 98 -  111 mmol/L   CO2 19 (L) 22 - 32 mmol/L   Glucose, Bld 118 (H) 70 - 99 mg/dL    Comment: Glucose reference range applies only to samples taken after fasting for at least 8 hours.   BUN 11 6 - 20 mg/dL   Creatinine, Ser 3.71 (H) 0.44 - 1.00 mg/dL   Calcium 8.2 (L) 8.9 - 10.3 mg/dL   GFR, Estimated >69 >67 mL/min    Comment: (NOTE) Calculated using the CKD-EPI Creatinine Equation (2021)    Anion gap 9 5 - 15    Comment: Performed at [facility], [address], [place], [place] [address]  CBG monitoring, ED     Status: Abnormal   Collection Time: 04/24/23  4:22 PM  Result Value Ref Range   Glucose-Capillary 102 (H) 70 - 99 mg/dL    Comment: Glucose reference range applies only to samples taken after fasting for at least 8 hours.  Urinalysis, Routine w reflex microscopic -Urine, Clean Catch     Status: Abnormal   Collection Time: 04/24/23  7:03 PM  Result Value Ref Range   Color, Urine YELLOW YELLOW   APPearance CLOUDY (A) CLEAR   Specific Gravity, Urine 1.010 1.005 - 1.030   pH 5.0 5.0 - 8.0   Glucose, UA NEGATIVE NEGATIVE mg/dL   Hgb urine dipstick LARGE (A) NEGATIVE   Bilirubin Urine NEGATIVE NEGATIVE   Ketones, ur NEGATIVE NEGATIVE mg/dL   Protein, ur NEGATIVE NEGATIVE mg/dL   Nitrite NEGATIVE NEGATIVE   Leukocytes,Ua LARGE (A) NEGATIVE   RBC / HPF >50 0 - 5 RBC/hpf   WBC, UA >50 0 - 5 WBC/hpf   Bacteria, UA RARE (A) NONE SEEN   Squamous Epithelial / HPF 11-20 0 - 5 /HPF   Mucus PRESENT    Hyaline Casts, UA PRESENT     Comment: Performed at [facility], [address] [place], [place] [address]  Comprehensive metabolic panel     Status: Abnormal   Collection Time: 04/24/23  7:30 PM  Result Value Ref  Range   Sodium 141 135 - 145 mmol/L   Potassium 3.3 (L) 3.5 - 5.1 mmol/L   Chloride 109 98 - 111 mmol/L   CO2 22 22 - 32 mmol/L   Glucose, Bld 105 (H) 70 - 99 mg/dL    Comment: Glucose reference range applies only to samples taken after fasting for at least 8 hours.   BUN 19 6 - 20 mg/dL   Creatinine, Ser 0.25 (H) 0.44 - 1.00 mg/dL   Calcium 8.5 (L) 8.9 - 10.3 mg/dL   Total Protein 6.7 6.5 - 8.1 g/dL   Albumin 3.1 (L) 3.5 - 5.0 g/dL   AST 24 15 - 41 U/L   ALT 35 0 - 44 U/L   Alkaline Phosphatase 131 (H) 38 - 126 U/L   Total Bilirubin 0.4 0.3 - 1.2 mg/dL   GFR, Estimated 45 (L) >60 mL/min    Comment: (NOTE) Calculated using the CKD-EPI Creatinine Equation (2021)    Anion gap 10 5 - 15    Comment: Performed at [facility], [address] [place], [place] [address]  CBC WITH DIFFERENTIAL     Status: Abnormal   Collection Time: 04/24/23  7:30 PM  Result Value Ref Range   WBC 8.7 4.0 - 10.5 K/uL   RBC 3.15 (L) 3.87 - 5.11 MIL/uL   Hemoglobin 9.2 (L) 12.0 - 15.0 g/dL   HCT 82.4 (L) 23.5 -  46.0 %   MCV 97.8 80.0 - 100.0 fL   MCH 29.2 26.0 - 34.0 pg   MCHC 29.9 (L) 30.0 - 36.0 g/dL   RDW 40.9 (H) 81.1 - 91.4 %   Platelets 167 150 - 400 K/uL   nRBC 0.0 0.0 - 0.2 %   Neutrophils Relative % 74 %   Neutro Abs 6.5 1.7 - 7.7 K/uL   Lymphocytes Relative 10 %   Lymphs Abs 0.9 0.7 - 4.0 K/uL   Monocytes Relative 11 %   Monocytes Absolute 1.0 0.1 - 1.0 K/uL   Eosinophils Relative 3 %   Eosinophils Absolute 0.3 0.0 - 0.5 K/uL   Basophils Relative 1 %   Basophils Absolute 0.1 0.0 - 0.1 K/uL   Immature Granulocytes 1 %   Abs Immature Granulocytes 0.06 0.00 - 0.07 K/uL    Comment: Performed at [facility], [address] [place], [place] [address]  Ammonia     Status: None   Collection Time: 04/24/23  7:30 PM  Result Value Ref Range   Ammonia 17 9 - 35 umol/L    Comment: Performed at [facility], [address]  [place], [place] [address]  CBC     Status: Abnormal   Collection Time: 04/25/23  8:00 AM  Result Value Ref Range   WBC 8.4 4.0 - 10.5 K/uL   RBC 2.97 (L) 3.87 - 5.11 MIL/uL   Hemoglobin 8.7 (L) 12.0 - 15.0 g/dL   HCT 86.5 (L) 78.4 - 69.6 %   MCV 98.3 80.0 - 100.0 fL   MCH 29.3 26.0 - 34.0 pg   MCHC 29.8 (L) 30.0 - 36.0 g/dL   RDW 29.5 (H) 28.4 - 13.2 %   Platelets 171 150 - 400 K/uL   nRBC 0.0 0.0 - 0.2 %    Comment: Performed at [facility], [address] [place], [place] [address]  Basic metabolic panel     Status: Abnormal   Collection Time: 04/25/23  8:00 AM  Result Value Ref Range   Sodium 142 135 - 145 mmol/L   Potassium 3.6 3.5 - 5.1 mmol/L   Chloride 112 (H) 98 - 111 mmol/L   CO2 22 22 - 32 mmol/L   Glucose, Bld 105 (H) 70 - 99 mg/dL    Comment: Glucose reference range applies only to samples taken after fasting for at least 8 hours.   BUN 18 6 - 20 mg/dL   Creatinine, Ser 2.72 (H) 0.44 - 1.00 mg/dL   Calcium 8.2 (L) 8.9 - 10.3 mg/dL   GFR, Estimated 49 (L) >60 mL/min    Comment: (NOTE) Calculated using the CKD-EPI Creatinine Equation (2021)    Anion gap 8 5 - 15    Comment: Performed at [facility], [address] [place], [place] [address]  MRSA Next Gen by PCR, Nasal     Status: None   Collection Time: 04/25/23  1:01 PM   Specimen: Nasal Mucosa; Nasal Swab  Result Value Ref Range   MRSA by PCR Next Gen NOT DETECTED NOT DETECTED    Comment: (NOTE) The GeneXpert MRSA Assay (FDA approved for NASAL specimens only), is one component of a comprehensive MRSA colonization surveillance program. It is not intended to diagnose MRSA infection nor to guide or monitor treatment for MRSA infections. Test performance is not FDA approved in patients less than 34 years old. Performed at [facility], [address] [place], [place]  [address]       ASSESSMENT AND PLAN  Multiple sclerosis (HCC) - Plan: CBC with  Differential/Platelet, Comprehensive metabolic panel, Hemoglobin A1c  High risk medication use - Plan: CBC with Differential/Platelet, Comprehensive metabolic panel, Hemoglobin A1c  Obstructive sleep apnea  Central sleep apnea  Spastic quadriparesis (HCC)  Auditory hallucinations  Excessive daytime sleepiness  Frequent UTI   She is doing better since the infection has cleared.  She no longer has hallucinations.  However, she has had some transient episodes of altered awareness possibly seizure.   For the MS, now that lymphocytes are back into the normal range (0.9 a couple weeks ago) we could consider restarting a disease modifying.  We will start teriflunomide 14 mg.  She will have a port flush in 3 weeks and we will check CBC with differential and liver function test.      Port will be flushed about every 4-6 weeks.  She can have liver function test when that time. Home sleep study showed obstructive sleep apnea as well as Cheyne-Stokes respirations.  Due to the combination of central and obstructive sleep apnea we will have her come in for an in lab titration.  Unfortunately she had to reschedule due to COVID.Marland Kitchen  They will be building a handicap accessible home.  The husband plans on starting an overhead lift for improved safety compared to a [employer] lift.    rtc 6 months, sooner if ne or worsening neurologic issues  42-minute office visit with the majority of the time spent face-to-face for history and physical, discussion/counseling and decision-making.  Additional time with record review and documentation.    This visit is part of a comprehensive longitudinal care medical relationship regarding the patients primary diagnosis of MS and related concerns.  Jarah Pember A. Epimenio Foot, MD, PhD 05/15/2023, 7:47 PM Certified in Neurology, Clinical Neurophysiology, Sleep Medicine, Pain Medicine and Neuroimaging  [facility] [address] [place], [place] [address] [phone]  Addendum 09/08/20:  Fixed error in history - correct statement should be she switched from Tysabri to Vumerity. -- RAS

## 2023-06-06 ENCOUNTER — Encounter: Payer: Self-pay | Admitting: Neurology

## 2023-06-07 NOTE — Addendum Note (Signed)
Addended by: Arther Abbott on: 06/07/2023 08:15 AM   Modules accepted: Orders

## 2023-06-07 NOTE — Telephone Encounter (Signed)
Spoke w/ Holly in [place]. Pt coming 06/12/23 at 11:30am to get port flushed

## 2023-06-12 ENCOUNTER — Other Ambulatory Visit: Payer: Self-pay

## 2023-06-12 DIAGNOSIS — G4719 Other hypersomnia: Secondary | ICD-10-CM

## 2023-06-12 DIAGNOSIS — R440 Auditory hallucinations: Secondary | ICD-10-CM

## 2023-06-12 DIAGNOSIS — G4733 Obstructive sleep apnea (adult) (pediatric): Secondary | ICD-10-CM

## 2023-06-12 DIAGNOSIS — Z79899 Other long term (current) drug therapy: Secondary | ICD-10-CM

## 2023-06-12 DIAGNOSIS — N390 Urinary tract infection, site not specified: Secondary | ICD-10-CM

## 2023-06-12 DIAGNOSIS — G4731 Primary central sleep apnea: Secondary | ICD-10-CM

## 2023-06-12 DIAGNOSIS — G35 Multiple sclerosis: Secondary | ICD-10-CM

## 2023-06-12 DIAGNOSIS — G8250 Quadriplegia, unspecified: Secondary | ICD-10-CM

## 2023-06-13 LAB — CBC WITH DIFFERENTIAL/PLATELET
Basophils Absolute: 0.1 10*3/uL (ref 0.0–0.2)
Basos: 1 %
EOS (ABSOLUTE): 0.4 10*3/uL (ref 0.0–0.4)
Eos: 5 %
Hematocrit: 31.1 % — ABNORMAL LOW (ref 34.0–46.6)
Hemoglobin: 9.6 g/dL — ABNORMAL LOW (ref 11.1–15.9)
Immature Grans (Abs): 0 10*3/uL (ref 0.0–0.1)
Immature Granulocytes: 0 %
Lymphocytes Absolute: 0.8 10*3/uL (ref 0.7–3.1)
Lymphs: 9 %
MCH: 28 pg (ref 26.6–33.0)
MCHC: 30.9 g/dL — ABNORMAL LOW (ref 31.5–35.7)
MCV: 91 fL (ref 79–97)
Monocytes Absolute: 1.1 10*3/uL — ABNORMAL HIGH (ref 0.1–0.9)
Monocytes: 12 %
Neutrophils Absolute: 6.6 10*3/uL (ref 1.4–7.0)
Neutrophils: 73 %
Platelets: 183 10*3/uL (ref 150–450)
RBC: 3.43 x10E6/uL — ABNORMAL LOW (ref 3.77–5.28)
RDW: 15.2 % (ref 11.7–15.4)
WBC: 9 10*3/uL (ref 3.4–10.8)

## 2023-06-13 LAB — COMPREHENSIVE METABOLIC PANEL
ALT: 24 [IU]/L (ref 0–32)
AST: 29 [IU]/L (ref 0–40)
Albumin: 4 g/dL (ref 3.8–4.9)
Alkaline Phosphatase: 170 [IU]/L — ABNORMAL HIGH (ref 44–121)
BUN/Creatinine Ratio: 20 (ref 9–23)
BUN: 41 mg/dL — ABNORMAL HIGH (ref 6–24)
Bilirubin Total: <0.2 mg/dL (ref 0.0–1.2)
CO2: 17 mmol/L — ABNORMAL LOW (ref 20–29)
Calcium: 9.3 mg/dL (ref 8.7–10.2)
Chloride: 112 mmol/L — ABNORMAL HIGH (ref 96–106)
Creatinine, Ser: 2.1 mg/dL — ABNORMAL HIGH (ref 0.57–1.00)
Globulin, Total: 2.4 g/dL (ref 1.5–4.5)
Glucose: 119 mg/dL — ABNORMAL HIGH (ref 70–99)
Potassium: 5.1 mmol/L (ref 3.5–5.2)
Sodium: 143 mmol/L (ref 134–144)
Total Protein: 6.4 g/dL (ref 6.0–8.5)
eGFR: 27 mL/min/{1.73_m2} — ABNORMAL LOW (ref >59)

## 2023-06-13 LAB — TSH: TSH: 6.64 u[IU]/mL — ABNORMAL HIGH (ref 0.450–4.500)

## 2023-06-19 ENCOUNTER — Ambulatory Visit (INDEPENDENT_AMBULATORY_CARE_PROVIDER_SITE_OTHER): Payer: Medicare Other | Admitting: Neurology

## 2023-06-19 DIAGNOSIS — G4731 Primary central sleep apnea: Secondary | ICD-10-CM | POA: Diagnosis not present

## 2023-06-19 DIAGNOSIS — G4733 Obstructive sleep apnea (adult) (pediatric): Secondary | ICD-10-CM

## 2023-06-23 NOTE — Progress Notes (Signed)
[facility] PAP TITRATION INTERPRETATION REPORT   STUDY DATE: 06/19/2023      PATIENT NAME:  Vanessa Short         DATE OF BIRTH:  [birth year]  PATIENT ID:  [id number]    TYPE OF STUDY:    READING PHYSICIAN: Despina Arias, MD SCORING TECHNICIAN: Margaretann Loveless, RPSGT   HISTORY: This 57 year-old Female reports . The Epworth Sleepiness Scale was 15 out of 24 (scores above or equal to 10 are suggestive of hypersomnolence).  DESCRIPTION: A sleep technologist was in attendance for the duration of the recording.  Data collection, scoring, video monitoring, and reporting were performed in compliance with the AASM Manual for the Scoring of Sleep and Associated Events; (Hypopnea is scored based on the criteria listed in Section VIII D. 1b in the AASM Manual V2.6 using a 4% oxygen desaturation rule or Hypopnea is scored based on the criteria listed in Section VIII D. 1a in the AASM Manual V2.6 using 3% oxygen desaturation and /or arousal rule).  A physician certified by the American Board of Sleep Medicine reviewed each epoch of the study.  ADDITIONAL INFORMATION:  Height: 63.0 in Weight: 175 lb (BMI 31)    MEDICATIONS: Acyclovir, Aspirin, Vitamin B, Vitamin D, Pepcid, Prozac, Folvite, Lasix, Normodyne, Lidocaine-Prilocaine, Amitiza, Apriso, Multivitamin, Bactroban, Trileptal, Klor - Con, Seroquel, Crestor, Sodium Chloride, Detrol, Dantrium, Valium, Doxycycline, Zofran, Lioresal   IMPRESSION:   1. The patient was initiated on CPAP +5 cm and inreased to 7 cm.  Due to continued central and obstructive events, BiPAP was initated and titrated to 13/9 cm H2O pressure.   Due to continued central events with periodic breathing noted, the patient was placed on BiPAP ST and titrated to +15/11 cm H2O pressure with BUR 12.   On this setting, the  AHI was 0.   2. Total sleep time was 4 hr and 23 minutes with a sleep efficiency of 74%   3. No significant peridfic limb movements  (PLMs) observed.       RECOMMENDATIONS:   RECOMMENDATIONS 1.  BiPAP ST  +15/11 cm H2O pressure with BUR 12 with heated humidified machine and mask of choice.  The mask during the study was F&P Evora FFM size XS 2.  Download and F/u with Dr. Epimenio Foot in 2-3 months  Pawan Knechtel A. Epimenio Foot, MD, PhD, FAAN Certified in Neurology, Clinical Neurophysiology, Sleep Medicine   ________________________________________________________  SLEEP CONTINUITY AND SLEEP ARCHITECTURE:  Lights off was at 23:04: and lights on 05:01: (6.0 hours in bed). Total sleep time was 263.0 minutes (100.0% supine;  0.0% lateral;  0.0% prone, 4.2% REM sleep), with a decreased sleep efficiency at 73.7%. Sleep latency was normal at 9.5 minutes.  Of the total sleep time, the percentage of stage N1 sleep was 7.0%, stage N2 sleep was 88.8%, stage N3 sleep was 0.0%, and REM sleep was 4.2%. There were 1 Stage R periods observed on this study night, 36 awakenings (i.e. transitions to Stage W from any sleep stage), and 104.0 total stage transitions. Wake after sleep onset (WASO) time accounted for 84 minutes.  AROUSAL: There were 77 arousals in total, for an arousal index of 17.6 arousals/hour.  Of these, 30 were identified as respiratory-related arousals (6.8 /h), 0 were PLM-related arousals (0.0 /h), and 76 were non-specific arousals (17.3 /h)  RESPIRATORY MONITORING:  Based on CMS criteria (using a 4% oxygen desaturation rule for scoring hypopneas), there were 66 apneas (0 obstructive; 66 central; 0 mixed), and  14 hypopneas.  Apnea index was 15.1. Hypopnea index was 3.2. The apnea-hypopnea index was 18.3 overall (18.3 supine, 0.0 non-supine; 10.9 REM, 10.9 supine REM). There were 0 respiratory effort-related arousals (RERAs).  The RERA index was 0.0 events/h. Total respiratory disturbance index (RDI) was 18.3 events/h. RDI results showed: supine RDI  18.3 /h; non-supine RDI 0.0 /h; REM RDI 10.9 /h, supine REM RDI 10.9 /h.   Based on AASM criteria  (using a 3% oxygen desaturation and /or arousal rule for scoring hypopneas), there were 66 apneas (0 obstructive; 66 central; 0 mixed), and 19 hypopneas. Apnea index was 15.1. Hypopnea index was 4.3. The apnea-hypopnea index was 19.4 overall (19.4 supine, 0.0 non-supine; 10.9 REM, 10.9 supine REM). There were 0 respiratory effort-related arousals (RERAs).  The RERA index was 0.0 events/h. Total respiratory disturbance index (RDI) was 19.4 events/h. RDI results showed: supine RDI  19.4 /h; non-supine RDI 0.0 /h; REM RDI 10.9 /h, supine REM RDI 10.9 /h.  Respiratory events were associated with oxyhemoglobin desaturations (nadir during sleep 85%) from a mean of 97%). There were 0 occurrences of Cheyne Stokes breathing. OXIMETRY: Total sleep time spent at, or below 88% was 0.6 minutes, or 0.2% of total sleep time. Snoring was classified as . BODY POSITION: Duration of total sleep and percent of total sleep in their respective position is as follows: supine 263 minutes (100.0%), non-supine 0.0 minutes (0.0%); right 00 minutes (0.0%), left 00 minutes (0.0%), and prone 00 minutes (0.0%). Total supine REM sleep time was 11 minutes (100.0% of total REM sleep). LIMB MOVEMENTS: There were 0 periodic limb movements of sleep (0.0/h), of which 0 (0.0/h) were associated with an arousal.  Recommended Settings:   IPAP: N/A cmH20 EPAP: N/A cmH2O AHI (4%):  N/A  AHI (3%):  19.4 CAHI (4%): N/A CAHI (3%): N/A RDI (4%): N/A RDI (3%): N/A NADIR: N/A %<=88% N/A   CARDIAC: The electrocardiogram documented EKG.  The average heart rate during sleep was 71 bpm.  The maximum heart rate during sleep was 86 bpm. The maximum heart rate during recording was 90.

## 2023-06-25 ENCOUNTER — Encounter: Payer: Self-pay | Admitting: *Deleted

## 2023-06-25 ENCOUNTER — Other Ambulatory Visit: Payer: Self-pay | Admitting: *Deleted

## 2023-06-25 DIAGNOSIS — G4731 Primary central sleep apnea: Secondary | ICD-10-CM

## 2023-06-25 DIAGNOSIS — G4733 Obstructive sleep apnea (adult) (pediatric): Secondary | ICD-10-CM

## 2023-07-19 ENCOUNTER — Encounter: Payer: Self-pay | Admitting: Neurology

## 2023-07-23 ENCOUNTER — Other Ambulatory Visit: Payer: Self-pay | Admitting: *Deleted

## 2023-07-23 DIAGNOSIS — G35 Multiple sclerosis: Secondary | ICD-10-CM

## 2023-07-23 DIAGNOSIS — Z79899 Other long term (current) drug therapy: Secondary | ICD-10-CM

## 2023-07-24 ENCOUNTER — Other Ambulatory Visit: Payer: Self-pay

## 2023-07-24 ENCOUNTER — Encounter: Payer: Self-pay | Admitting: Neurology

## 2023-07-24 DIAGNOSIS — G35 Multiple sclerosis: Secondary | ICD-10-CM

## 2023-07-24 DIAGNOSIS — Z79899 Other long term (current) drug therapy: Secondary | ICD-10-CM

## 2023-07-27 LAB — CBC WITH DIFFERENTIAL/PLATELET
Basophils Absolute: 0.1 10*3/uL (ref 0.0–0.2)
Basos: 1 %
EOS (ABSOLUTE): 0.3 10*3/uL (ref 0.0–0.4)
Eos: 3 %
Hematocrit: 27.3 % — ABNORMAL LOW (ref 34.0–46.6)
Hemoglobin: 8.4 g/dL — ABNORMAL LOW (ref 11.1–15.9)
Immature Grans (Abs): 0.1 10*3/uL (ref 0.0–0.1)
Immature Granulocytes: 1 %
Lymphocytes Absolute: 0.6 10*3/uL — ABNORMAL LOW (ref 0.7–3.1)
Lymphs: 6 %
MCH: 28.8 pg (ref 26.6–33.0)
MCHC: 30.8 g/dL — ABNORMAL LOW (ref 31.5–35.7)
MCV: 94 fL (ref 79–97)
Monocytes Absolute: 1.1 10*3/uL — ABNORMAL HIGH (ref 0.1–0.9)
Monocytes: 11 %
Neutrophils Absolute: 8 10*3/uL — ABNORMAL HIGH (ref 1.4–7.0)
Neutrophils: 78 %
Platelets: 136 10*3/uL — ABNORMAL LOW (ref 150–450)
RBC: 2.92 x10E6/uL — ABNORMAL LOW (ref 3.77–5.28)
RDW: 18.2 % — ABNORMAL HIGH (ref 11.7–15.4)
WBC: 10.2 10*3/uL (ref 3.4–10.8)

## 2023-07-27 LAB — COMPREHENSIVE METABOLIC PANEL
ALT: 33 [IU]/L — ABNORMAL HIGH (ref 0–32)
AST: 33 [IU]/L (ref 0–40)
Albumin: 3.9 g/dL (ref 3.8–4.9)
Alkaline Phosphatase: 201 [IU]/L — ABNORMAL HIGH (ref 44–121)
BUN/Creatinine Ratio: 19 (ref 9–23)
BUN: 32 mg/dL — ABNORMAL HIGH (ref 6–24)
Bilirubin Total: 0.3 mg/dL (ref 0.0–1.2)
CO2: 17 mmol/L — ABNORMAL LOW (ref 20–29)
Calcium: 8.9 mg/dL (ref 8.7–10.2)
Chloride: 112 mmol/L — ABNORMAL HIGH (ref 96–106)
Creatinine, Ser: 1.68 mg/dL — ABNORMAL HIGH (ref 0.57–1.00)
Globulin, Total: 2.3 g/dL (ref 1.5–4.5)
Glucose: 96 mg/dL (ref 70–99)
Potassium: 4.7 mmol/L (ref 3.5–5.2)
Sodium: 144 mmol/L (ref 134–144)
Total Protein: 6.2 g/dL (ref 6.0–8.5)
eGFR: 35 mL/min/{1.73_m2} — ABNORMAL LOW (ref >59)

## 2023-07-27 LAB — HEMOGLOBIN A1C
Est. average glucose Bld gHb Est-mCnc: 82 mg/dL
Hgb A1c MFr Bld: 4.5 % — ABNORMAL LOW (ref 4.8–5.6)

## 2023-07-27 LAB — TSH: TSH: 8.16 u[IU]/mL — ABNORMAL HIGH (ref 0.450–4.500)

## 2023-07-30 NOTE — Telephone Encounter (Signed)
Pt husband following up from MyChart msg about pt's bipap fitting. Wanting to know next steps and questions about insurance start dates? Please call pt's husband Tresa Endo) back at [phone]

## 2023-07-31 ENCOUNTER — Encounter: Payer: Self-pay | Admitting: *Deleted

## 2023-07-31 NOTE — Telephone Encounter (Signed)
Sent a community message to [employer].

## 2023-07-31 NOTE — Telephone Encounter (Signed)
New, Doristine Mango, RN; Margit Hanks,  Patient was setup on 07/30/23. Looks like she got a SUNSET (EVO/PROBASICS/ELARA) FF Mask w HG - Medium.  Thank you,  Brad New     Previous Messages    ----- Message ----- From: Guy Begin, RN Sent: 07/31/2023   9:42 AM EST To: Alain Honey; Rojelio Brenner; * Subject: bipap mask fitting?                            Good morning,  Can you tell the status for pt.  I think she has her machine coming 08-03-2023, will she be getting a mask fitting too?    Yaslin A. Cuthbert Female, 57 y.o., [birth year] MRN: [id number]   Andrey Campanile

## 2023-07-31 NOTE — Telephone Encounter (Signed)
I called husband.  She started using this last night, although he states approval last month.  I would contact DME concerning his questions about start date.  He verbalized understanding.  Last night issue with mask, will work on this.  Appreciated call back.   Has appt 09-19-2023 with Dr. Epimenio Foot.

## 2023-08-17 ENCOUNTER — Encounter: Payer: Self-pay | Admitting: Neurology

## 2023-08-21 ENCOUNTER — Emergency Department (HOSPITAL_COMMUNITY): Payer: Medicare Other

## 2023-08-21 ENCOUNTER — Encounter (HOSPITAL_COMMUNITY): Payer: Self-pay

## 2023-08-21 ENCOUNTER — Inpatient Hospital Stay (HOSPITAL_COMMUNITY)
Admission: EM | Admit: 2023-08-21 | Discharge: 2023-08-25 | DRG: 689 | Disposition: A | Discharge status: Home / Self Care | Payer: Medicare Other | Attending: Family Medicine | Admitting: Family Medicine

## 2023-08-21 DIAGNOSIS — D500 Iron deficiency anemia secondary to blood loss (chronic): Secondary | ICD-10-CM

## 2023-08-21 DIAGNOSIS — I2510 Atherosclerotic heart disease of native coronary artery without angina pectoris: Secondary | ICD-10-CM | POA: Diagnosis present

## 2023-08-21 DIAGNOSIS — Z993 Dependence on wheelchair: Secondary | ICD-10-CM

## 2023-08-21 DIAGNOSIS — G629 Polyneuropathy, unspecified: Secondary | ICD-10-CM | POA: Diagnosis present

## 2023-08-21 DIAGNOSIS — D62 Acute posthemorrhagic anemia: Secondary | ICD-10-CM | POA: Diagnosis present

## 2023-08-21 DIAGNOSIS — Z880 Allergy status to penicillin: Secondary | ICD-10-CM

## 2023-08-21 DIAGNOSIS — N179 Acute kidney failure, unspecified: Secondary | ICD-10-CM

## 2023-08-21 DIAGNOSIS — R0981 Nasal congestion: Secondary | ICD-10-CM | POA: Diagnosis present

## 2023-08-21 DIAGNOSIS — F39 Unspecified mood [affective] disorder: Secondary | ICD-10-CM | POA: Diagnosis present

## 2023-08-21 DIAGNOSIS — J069 Acute upper respiratory infection, unspecified: Secondary | ICD-10-CM

## 2023-08-21 DIAGNOSIS — K3189 Other diseases of stomach and duodenum: Secondary | ICD-10-CM | POA: Diagnosis present

## 2023-08-21 DIAGNOSIS — K921 Melena: Secondary | ICD-10-CM

## 2023-08-21 DIAGNOSIS — Z886 Allergy status to analgesic agent status: Secondary | ICD-10-CM

## 2023-08-21 DIAGNOSIS — N390 Urinary tract infection, site not specified: Secondary | ICD-10-CM | POA: Diagnosis not present

## 2023-08-21 DIAGNOSIS — Z7982 Long term (current) use of aspirin: Secondary | ICD-10-CM

## 2023-08-21 DIAGNOSIS — N3000 Acute cystitis without hematuria: Principal | ICD-10-CM

## 2023-08-21 DIAGNOSIS — E039 Hypothyroidism, unspecified: Secondary | ICD-10-CM | POA: Diagnosis present

## 2023-08-21 DIAGNOSIS — R051 Acute cough: Secondary | ICD-10-CM

## 2023-08-21 DIAGNOSIS — E785 Hyperlipidemia, unspecified: Secondary | ICD-10-CM | POA: Diagnosis present

## 2023-08-21 DIAGNOSIS — Z833 Family history of diabetes mellitus: Secondary | ICD-10-CM

## 2023-08-21 DIAGNOSIS — R0902 Hypoxemia: Secondary | ICD-10-CM | POA: Diagnosis present

## 2023-08-21 DIAGNOSIS — B379 Candidiasis, unspecified: Secondary | ICD-10-CM | POA: Diagnosis present

## 2023-08-21 DIAGNOSIS — Z888 Allergy status to other drugs, medicaments and biological substances status: Secondary | ICD-10-CM

## 2023-08-21 DIAGNOSIS — Z79890 Hormone replacement therapy (postmenopausal): Secondary | ICD-10-CM

## 2023-08-21 DIAGNOSIS — K51911 Ulcerative colitis, unspecified with rectal bleeding: Secondary | ICD-10-CM | POA: Diagnosis present

## 2023-08-21 DIAGNOSIS — G35 Multiple sclerosis: Secondary | ICD-10-CM | POA: Diagnosis present

## 2023-08-21 DIAGNOSIS — Z885 Allergy status to narcotic agent status: Secondary | ICD-10-CM

## 2023-08-21 DIAGNOSIS — Z8249 Family history of ischemic heart disease and other diseases of the circulatory system: Secondary | ICD-10-CM

## 2023-08-21 DIAGNOSIS — K2211 Ulcer of esophagus with bleeding: Secondary | ICD-10-CM | POA: Diagnosis present

## 2023-08-21 DIAGNOSIS — R053 Chronic cough: Secondary | ICD-10-CM | POA: Diagnosis present

## 2023-08-21 DIAGNOSIS — Z83438 Family history of other disorder of lipoprotein metabolism and other lipidemia: Secondary | ICD-10-CM

## 2023-08-21 DIAGNOSIS — B9789 Other viral agents as the cause of diseases classified elsewhere: Secondary | ICD-10-CM | POA: Diagnosis present

## 2023-08-21 DIAGNOSIS — Z79899 Other long term (current) drug therapy: Secondary | ICD-10-CM

## 2023-08-21 DIAGNOSIS — Z1152 Encounter for screening for COVID-19: Secondary | ICD-10-CM

## 2023-08-21 DIAGNOSIS — N1831 Chronic kidney disease, stage 3a: Secondary | ICD-10-CM | POA: Diagnosis present

## 2023-08-21 DIAGNOSIS — D509 Iron deficiency anemia, unspecified: Secondary | ICD-10-CM | POA: Diagnosis present

## 2023-08-21 DIAGNOSIS — Z87442 Personal history of urinary calculi: Secondary | ICD-10-CM

## 2023-08-21 DIAGNOSIS — I129 Hypertensive chronic kidney disease with stage 1 through stage 4 chronic kidney disease, or unspecified chronic kidney disease: Secondary | ICD-10-CM | POA: Diagnosis present

## 2023-08-21 LAB — CBC WITH DIFFERENTIAL/PLATELET
Abs Immature Granulocytes: 0.1 10*3/uL — ABNORMAL HIGH (ref 0.00–0.07)
Basophils Absolute: 0.1 10*3/uL (ref 0.0–0.1)
Basophils Relative: 0 %
Eosinophils Absolute: 0.2 10*3/uL (ref 0.0–0.5)
Eosinophils Relative: 1 %
HCT: 30.5 % — ABNORMAL LOW (ref 36.0–46.0)
Hemoglobin: 9.4 g/dL — ABNORMAL LOW (ref 12.0–15.0)
Immature Granulocytes: 1 %
Lymphocytes Relative: 6 %
Lymphs Abs: 0.9 10*3/uL (ref 0.7–4.0)
MCH: 31.1 pg (ref 26.0–34.0)
MCHC: 30.8 g/dL (ref 30.0–36.0)
MCV: 101 fL — ABNORMAL HIGH (ref 80.0–100.0)
Monocytes Absolute: 1.9 10*3/uL — ABNORMAL HIGH (ref 0.1–1.0)
Monocytes Relative: 13 %
Neutro Abs: 11.2 10*3/uL — ABNORMAL HIGH (ref 1.7–7.7)
Neutrophils Relative %: 79 %
Platelets: 182 10*3/uL (ref 150–400)
RBC: 3.02 MIL/uL — ABNORMAL LOW (ref 3.87–5.11)
RDW: 17.2 % — ABNORMAL HIGH (ref 11.5–15.5)
WBC: 14.2 10*3/uL — ABNORMAL HIGH (ref 4.0–10.5)
nRBC: 0 % (ref 0.0–0.2)

## 2023-08-21 LAB — APTT: aPTT: 31 s (ref 24–36)

## 2023-08-21 LAB — URINALYSIS, W/ REFLEX TO CULTURE (INFECTION SUSPECTED)
Bilirubin Urine: NEGATIVE
Glucose, UA: NEGATIVE mg/dL
Ketones, ur: NEGATIVE mg/dL
Nitrite: NEGATIVE
Protein, ur: 100 mg/dL — AB
RBC / HPF: >50 RBC/hpf (ref 0–5)
Specific Gravity, Urine: 1.016 (ref 1.005–1.030)
WBC, UA: >50 WBC/hpf (ref 0–5)
pH: 5 (ref 5.0–8.0)

## 2023-08-21 LAB — COMPREHENSIVE METABOLIC PANEL
ALT: 27 U/L (ref 0–44)
AST: 18 U/L (ref 15–41)
Albumin: 3.6 g/dL (ref 3.5–5.0)
Alkaline Phosphatase: 153 U/L — ABNORMAL HIGH (ref 38–126)
Anion gap: 13 (ref 5–15)
BUN: 44 mg/dL — ABNORMAL HIGH (ref 6–20)
CO2: 14 mmol/L — ABNORMAL LOW (ref 22–32)
Calcium: 9.2 mg/dL (ref 8.9–10.3)
Chloride: 111 mmol/L (ref 98–111)
Creatinine, Ser: 2.06 mg/dL — ABNORMAL HIGH (ref 0.44–1.00)
GFR, Estimated: 27 mL/min — ABNORMAL LOW (ref >60)
Glucose, Bld: 123 mg/dL — ABNORMAL HIGH (ref 70–99)
Potassium: 4.3 mmol/L (ref 3.5–5.1)
Sodium: 138 mmol/L (ref 135–145)
Total Bilirubin: 0.8 mg/dL (ref 0.0–1.2)
Total Protein: 7.5 g/dL (ref 6.5–8.1)

## 2023-08-21 LAB — RESP PANEL BY RT-PCR (RSV, FLU A&B, COVID)  RVPGX2
Influenza A by PCR: NEGATIVE
Influenza B by PCR: NEGATIVE
Resp Syncytial Virus by PCR: NEGATIVE
SARS Coronavirus 2 by RT PCR: NEGATIVE

## 2023-08-21 LAB — PROTIME-INR
INR: 1.2 (ref 0.8–1.2)
Prothrombin Time: 15.3 s — ABNORMAL HIGH (ref 11.4–15.2)

## 2023-08-21 MED ORDER — SODIUM CHLORIDE 0.9 % IV SOLN
2.0000 g | Freq: Once | INTRAVENOUS | Status: AC
  Administered 2023-08-21: 2 g via INTRAVENOUS
  Filled 2023-08-21: qty 10

## 2023-08-21 MED ORDER — LIDOCAINE-PRILOCAINE 2.5-2.5 % EX CREA
TOPICAL_CREAM | Freq: Once | CUTANEOUS | Status: DC
Start: 2023-08-21 — End: 2023-08-25

## 2023-08-21 MED ORDER — VANCOMYCIN HCL IN DEXTROSE 1-5 GM/200ML-% IV SOLN
1000.0000 mg | Freq: Once | INTRAVENOUS | Status: AC
  Administered 2023-08-22: 1000 mg via INTRAVENOUS
  Filled 2023-08-21: qty 200

## 2023-08-21 MED ORDER — LACTATED RINGERS IV BOLUS (SEPSIS)
1000.0000 mL | Freq: Once | INTRAVENOUS | Status: AC
  Administered 2023-08-21: 1000 mL via INTRAVENOUS

## 2023-08-21 MED ORDER — METRONIDAZOLE 500 MG/100ML IV SOLN
500.0000 mg | Freq: Once | INTRAVENOUS | Status: AC
  Administered 2023-08-21: 500 mg via INTRAVENOUS
  Filled 2023-08-21: qty 100

## 2023-08-21 MED ORDER — LACTATED RINGERS IV SOLN: INTRAVENOUS | Status: AC

## 2023-08-21 NOTE — ED Triage Notes (Signed)
 Pt presents with c/o nasal and chest congestion. Pt had a positive urine culture for bacteria approx a week ago and was placed on an antibiotic that her kidneys would tolerate due to low kidney function. Several days after being diagnosed with this, pt began to have nasal congestion and chest congestion.

## 2023-08-21 NOTE — ED Provider Notes (Addendum)
 [facility] Emergency Department Provider Note MRN:  [id number]  Arrival date & time: 08/22/23     Chief Complaint   Nasal Congestion and Follow-up   History of Present Illness   Vanessa Short is a 58 y.o. year-old female with PMH of multiple sclerosis, ulcerative colitis, CKD stage IIIa, hypertension, hyperlipidemia, OSA presents to the ED with chief complaint of worsening cough, fever, congestion, some confusion and recent UTI.  Had temp of 95 at home.  Tmax today in ED is 99.9.  She was recently diagnosed with a UTI and treated with single dose Fosfomycin.    History provided by patient.  Additional history provided by husband.   Review of Systems  Pertinent positive and negative review of systems noted in HPI.    Physical Exam   Vitals:   08/22/23 0024 08/22/23 0100  BP: 135/62 139/63  Pulse: 96 94  Resp: 20 17  Temp: 98.4 F (36.9 C)   SpO2: 94% 95%    CONSTITUTIONAL:  unwell-appearing, NAD NEURO:  Alert and oriented x 3, CN 3-12 grossly intact EYES:  eyes equal and reactive ENT/NECK:  Supple, no stridor, normal visualized TMs with some cerumen obscuring complete visualization, normal oropharynx  CARDIO:  normal rate, regular rhythm, appears well-perfused  PULM:  Mildly increased WOB, crackles present GI/GU:  non-distended, no focal tenderness MSK/SPINE:  No gross deformities, no edema, moves all extremities  SKIN:  no rash, atraumatic   *Additional and/or pertinent findings included in MDM below  Diagnostic and Interventional Summary    EKG Interpretation Date/Time:    Ventricular Rate:    PR Interval:    QRS Duration:    QT Interval:    QTC Calculation:   R Axis:      Text Interpretation:         Labs Reviewed  CBC WITH DIFFERENTIAL/PLATELET - Abnormal; Notable for the following components:      Result Value   WBC 14.2 (*)    RBC 3.02 (*)    Hemoglobin 9.4 (*)    HCT 30.5 (*)    MCV 101.0 (*)    RDW 17.2 (*)     Neutro Abs 11.2 (*)    Monocytes Absolute 1.9 (*)    Abs Immature Granulocytes 0.10 (*)    All other components within normal limits  COMPREHENSIVE METABOLIC PANEL - Abnormal; Notable for the following components:   CO2 14 (*)    Glucose, Bld 123 (*)    BUN 44 (*)    Creatinine, Ser 2.06 (*)    Alkaline Phosphatase 153 (*)    GFR, Estimated 27 (*)    All other components within normal limits  URINALYSIS, W/ REFLEX TO CULTURE (INFECTION SUSPECTED) - Abnormal; Notable for the following components:   APPearance TURBID (*)    Hgb urine dipstick LARGE (*)    Protein, ur 100 (*)    Leukocytes,Ua MODERATE (*)    Bacteria, UA MANY (*)    All other components within normal limits  PROTIME-INR - Abnormal; Notable for the following components:   Prothrombin Time 15.3 (*)    All other components within normal limits  RESP PANEL BY RT-PCR (RSV, FLU A&B, COVID)  [id number]  CULTURE, BLOOD (ROUTINE X 2)  CULTURE, BLOOD (ROUTINE X 2)  APTT  BASIC METABOLIC PANEL  CBC  I-STAT CG4 LACTIC ACID, ED  I-STAT CG4 LACTIC ACID, ED    DG Chest 2 View  Final Result      Medications  lidocaine -prilocaine  (EMLA ) cream ( Topical Not Given 08/21/23 2330)  lactated ringers  infusion (has no administration in time range)  vancomycin  (VANCOCIN ) IVPB 1000 mg/200 mL premix (1,000 mg Intravenous New Bag/Given 08/22/23 0040)  aspirin  EC tablet 81 mg (has no administration in time range)  acyclovir  (ZOVIRAX ) tablet 400 mg (has no administration in time range)  labetalol  (NORMODYNE ) tablet 50 mg (has no administration in time range)  rosuvastatin  (CRESTOR ) tablet 40 mg (40 mg Oral Given 08/22/23 0040)  FLUoxetine  (PROZAC ) capsule 20 mg (has no administration in time range)  QUEtiapine  (SEROQUEL ) tablet 25 mg (25 mg Oral Given 08/22/23 0040)  levothyroxine  (SYNTHROID ) tablet 25 mcg (has no administration in time range)  famotidine  (PEPCID ) tablet 40 mg (40 mg Oral Given 08/22/23 0045)  mesalamine  (APRISO ) 24 hr capsule 0.375  g (has no administration in time range)  fesoterodine  (TOVIAZ ) tablet 4 mg (has no administration in time range)  baclofen  (LIORESAL ) tablet 10 mg (has no administration in time range)  OXcarbazepine  (TRILEPTAL ) tablet 150 mg (has no administration in time range)  enoxaparin  (LOVENOX ) injection 40 mg (has no administration in time range)  ibuprofen  (ADVIL ) tablet 400 mg (has no administration in time range)  ondansetron  (ZOFRAN ) tablet 4 mg (has no administration in time range)    Or  ondansetron  (ZOFRAN ) injection 4 mg (has no administration in time range)  cefTRIAXone  (ROCEPHIN ) 2 g in sodium chloride  0.9 % 100 mL IVPB (2 g Intravenous New Bag/Given 08/22/23 0044)  sodium chloride  (OCEAN) 0.65 % nasal spray 1 spray (has no administration in time range)  lactated ringers  bolus 1,000 mL (1,000 mLs Intravenous New Bag/Given 08/21/23 2319)  aztreonam  (AZACTAM ) 2 g in sodium chloride  0.9 % 100 mL IVPB (0 g Intravenous Stopped 08/21/23 2337)  metroNIDAZOLE  (FLAGYL ) IVPB 500 mg (0 mg Intravenous Stopped 08/22/23 0038)     Procedures  /  Critical Care .Critical Care  Performed by: Vicky Charleston, [name] Authorized by: Vicky Charleston, [name]   Critical care provider statement:    Critical care time (minutes):  42   Critical care was necessary to treat or prevent imminent or life-threatening deterioration of the following conditions:  Sepsis   Critical care was time spent personally by me on the following activities:  Development of treatment plan with patient or surrogate, discussions with consultants, evaluation of patient's response to treatment, examination of patient, ordering and review of laboratory studies, ordering and review of radiographic studies, ordering and performing treatments and interventions, pulse oximetry, re-evaluation of patient's condition and review of old charts   ED Course and Medical Decision Making  I have reviewed the triage vital signs, the nursing notes, and pertinent  available records from the EMR.  Social Determinants Affecting Complexity of Care: Patient has no clinically significant social determinants affecting this chief complaint..   ED Course: Clinical Course as of 08/22/23 0122  Wed Aug 22, 2023  0031 Urinalysis, w/ Reflex to Culture (Infection Suspected) -Urine, Clean Catch(!) Worrisome for persistent infection [RB]  0031 Comprehensive metabolic panel(!) Creatinine increased from normal, BUN also up [RB]  0031 Resp panel by RT-PCR (RSV, Flu A&B, Covid) Anterior Nasal Swab Negative viral panel [RB]  0032 CBC with Differential(!) Leukocytosis, will cover with IV antibiotics. [RB]  0032 Patient had O2 sat of 89% on RA.  I've ordered respiratory care to continue to monitor if she doesn't maintain normal O2 sat. [RB]  0033 DG Chest 2 View No obvious infiltrate seen [RB]  0121 RN informs me patient has been  maintaining at 96% on RA and hasn't needed any supplemental O2. [RB]    Clinical Course User Index [RB] Vicky Charleston, [name]    Medical Decision Making Patient here with multiple symptoms.  Subjective fevers and chills. At home.  Has had coarse cough for about a week.  Recent treatment for UTI with fosfomycin.  Hx of CKD.  Will check labs.   Patient doesn't look well and I suspect she'll need admission to the hospital.  She is noted to have a high WBC, low O2 sat, AKI, and recent UTI.  Will activate code sepsis.  First lactic pending to guide fluid resuscitation.  Amount and/or Complexity of Data Reviewed Labs: ordered. Decision-making details documented in ED Course. Radiology:  Decision-making details documented in ED Course.  Risk Prescription drug management. Decision regarding hospitalization.         Consultants: I consulted with Hospitalist, Dr. Dena, who is appreciated for admitting.   Treatment and Plan: Patient's exam and diagnostic results are concerning for SIRS, UTI, AKI, cough and hypoxia.  Feel that  patient will need admission to the hospital for further treatment and evaluation.    Final Clinical Impressions(s) / ED Diagnoses     ICD-10-CM   1. Acute cystitis without hematuria  N30.00     2. AKI (acute kidney injury) (HCC)  N17.9     3. Acute cough  R05.1     4. Upper respiratory tract infection, unspecified type  J06.9       ED Discharge Orders     None         Discharge Instructions Discussed with and Provided to Patient:   Discharge Instructions   None      Vicky Charleston, [name] 08/22/23 0035    Vicky Charleston, [name] 08/22/23 0122    Bari Charmaine FALCON, MD 08/22/23 [phone]

## 2023-08-21 NOTE — ED Provider Triage Note (Signed)
 Emergency Medicine Provider Triage Evaluation Note  Angi A Taylor , a 58 y.o. female  was evaluated in triage.  Pt complains of cough.  Review of Systems  Positive:  Negative:   Physical Exam  BP 122/71 (BP Location: Left Arm)   Pulse 86   Temp 99.1 F (37.3 C) (Oral)   Resp 18   SpO2 98%  Gen:   Awake, no distress   Resp:  Normal effort  MSK:   Moves extremities without difficulty  Other:    Medical Decision Making  Medically screening exam initiated at 12:09 PM.  Appropriate orders placed.  Eugina A Ericsson was informed that the remainder of the evaluation will be completed by another provider, this initial triage assessment does not replace that evaluation, and the importance of remaining in the ED until their evaluation is complete.  Hx CKD. Recently placed on ABX for UTI. Patient and family member at bedside wanting another UA to make sure she does not have a UTI. Also with diarrhea a few days ago which they think is d/t the ABX. Patient also with nasal congestion, rhinorrhea, and cough x1 week. Also endorsing that 58F is considered a fever for patient given her baseline temp.  Denies chest pain, SOB.    Hoy Fraction F, [place] 08/21/23 1213

## 2023-08-21 NOTE — ED Notes (Signed)
 Sepsis time was slightly delayed d/t accessing pt port per request and not having available supplies

## 2023-08-21 NOTE — Sepsis Progress Note (Signed)
 Elink monitoring for the code sepsis protocol.

## 2023-08-22 ENCOUNTER — Encounter (HOSPITAL_COMMUNITY): Payer: Self-pay | Admitting: Internal Medicine

## 2023-08-22 ENCOUNTER — Other Ambulatory Visit: Payer: Self-pay

## 2023-08-22 ENCOUNTER — Other Ambulatory Visit: Payer: Self-pay | Admitting: Neurology

## 2023-08-22 DIAGNOSIS — Z79899 Other long term (current) drug therapy: Secondary | ICD-10-CM | POA: Diagnosis not present

## 2023-08-22 DIAGNOSIS — Z1152 Encounter for screening for COVID-19: Secondary | ICD-10-CM | POA: Diagnosis not present

## 2023-08-22 DIAGNOSIS — N1831 Chronic kidney disease, stage 3a: Secondary | ICD-10-CM | POA: Diagnosis present

## 2023-08-22 DIAGNOSIS — K51911 Ulcerative colitis, unspecified with rectal bleeding: Secondary | ICD-10-CM | POA: Diagnosis present

## 2023-08-22 DIAGNOSIS — D500 Iron deficiency anemia secondary to blood loss (chronic): Secondary | ICD-10-CM | POA: Diagnosis not present

## 2023-08-22 DIAGNOSIS — R053 Chronic cough: Secondary | ICD-10-CM | POA: Diagnosis present

## 2023-08-22 DIAGNOSIS — I110 Hypertensive heart disease with heart failure: Secondary | ICD-10-CM | POA: Diagnosis not present

## 2023-08-22 DIAGNOSIS — F39 Unspecified mood [affective] disorder: Secondary | ICD-10-CM | POA: Diagnosis present

## 2023-08-22 DIAGNOSIS — D509 Iron deficiency anemia, unspecified: Secondary | ICD-10-CM | POA: Diagnosis present

## 2023-08-22 DIAGNOSIS — D62 Acute posthemorrhagic anemia: Secondary | ICD-10-CM | POA: Diagnosis present

## 2023-08-22 DIAGNOSIS — R0981 Nasal congestion: Secondary | ICD-10-CM | POA: Diagnosis present

## 2023-08-22 DIAGNOSIS — N390 Urinary tract infection, site not specified: Secondary | ICD-10-CM | POA: Diagnosis present

## 2023-08-22 DIAGNOSIS — K2211 Ulcer of esophagus with bleeding: Secondary | ICD-10-CM | POA: Diagnosis present

## 2023-08-22 DIAGNOSIS — Z8249 Family history of ischemic heart disease and other diseases of the circulatory system: Secondary | ICD-10-CM | POA: Diagnosis not present

## 2023-08-22 DIAGNOSIS — B379 Candidiasis, unspecified: Secondary | ICD-10-CM | POA: Diagnosis present

## 2023-08-22 DIAGNOSIS — K3189 Other diseases of stomach and duodenum: Secondary | ICD-10-CM | POA: Diagnosis present

## 2023-08-22 DIAGNOSIS — Z79890 Hormone replacement therapy (postmenopausal): Secondary | ICD-10-CM | POA: Diagnosis not present

## 2023-08-22 DIAGNOSIS — K921 Melena: Secondary | ICD-10-CM | POA: Diagnosis present

## 2023-08-22 DIAGNOSIS — N3000 Acute cystitis without hematuria: Secondary | ICD-10-CM | POA: Diagnosis not present

## 2023-08-22 DIAGNOSIS — I2510 Atherosclerotic heart disease of native coronary artery without angina pectoris: Secondary | ICD-10-CM | POA: Diagnosis present

## 2023-08-22 DIAGNOSIS — K229 Disease of esophagus, unspecified: Secondary | ICD-10-CM | POA: Diagnosis not present

## 2023-08-22 DIAGNOSIS — Z993 Dependence on wheelchair: Secondary | ICD-10-CM | POA: Diagnosis not present

## 2023-08-22 DIAGNOSIS — N179 Acute kidney failure, unspecified: Secondary | ICD-10-CM | POA: Diagnosis present

## 2023-08-22 DIAGNOSIS — R051 Acute cough: Secondary | ICD-10-CM | POA: Diagnosis not present

## 2023-08-22 DIAGNOSIS — R0902 Hypoxemia: Secondary | ICD-10-CM

## 2023-08-22 DIAGNOSIS — Z7982 Long term (current) use of aspirin: Secondary | ICD-10-CM | POA: Diagnosis not present

## 2023-08-22 DIAGNOSIS — E785 Hyperlipidemia, unspecified: Secondary | ICD-10-CM | POA: Diagnosis present

## 2023-08-22 DIAGNOSIS — I129 Hypertensive chronic kidney disease with stage 1 through stage 4 chronic kidney disease, or unspecified chronic kidney disease: Secondary | ICD-10-CM | POA: Diagnosis present

## 2023-08-22 DIAGNOSIS — G35 Multiple sclerosis: Secondary | ICD-10-CM | POA: Diagnosis present

## 2023-08-22 DIAGNOSIS — I5032 Chronic diastolic (congestive) heart failure: Secondary | ICD-10-CM | POA: Diagnosis not present

## 2023-08-22 DIAGNOSIS — E039 Hypothyroidism, unspecified: Secondary | ICD-10-CM | POA: Diagnosis present

## 2023-08-22 LAB — CBC
HCT: 26.6 % — ABNORMAL LOW (ref 36.0–46.0)
Hemoglobin: 8.1 g/dL — ABNORMAL LOW (ref 12.0–15.0)
MCH: 31 pg (ref 26.0–34.0)
MCHC: 30.5 g/dL (ref 30.0–36.0)
MCV: 101.9 fL — ABNORMAL HIGH (ref 80.0–100.0)
Platelets: 187 10*3/uL (ref 150–400)
RBC: 2.61 MIL/uL — ABNORMAL LOW (ref 3.87–5.11)
RDW: 16.6 % — ABNORMAL HIGH (ref 11.5–15.5)
WBC: 15.6 10*3/uL — ABNORMAL HIGH (ref 4.0–10.5)
nRBC: 0 % (ref 0.0–0.2)

## 2023-08-22 LAB — BASIC METABOLIC PANEL
Anion gap: 13 (ref 5–15)
BUN: 39 mg/dL — ABNORMAL HIGH (ref 6–20)
CO2: 14 mmol/L — ABNORMAL LOW (ref 22–32)
Calcium: 8.6 mg/dL — ABNORMAL LOW (ref 8.9–10.3)
Chloride: 111 mmol/L (ref 98–111)
Creatinine, Ser: 2.01 mg/dL — ABNORMAL HIGH (ref 0.44–1.00)
GFR, Estimated: 28 mL/min — ABNORMAL LOW (ref >60)
Glucose, Bld: 105 mg/dL — ABNORMAL HIGH (ref 70–99)
Potassium: 3.9 mmol/L (ref 3.5–5.1)
Sodium: 138 mmol/L (ref 135–145)

## 2023-08-22 LAB — I-STAT CG4 LACTIC ACID, ED
Lactic Acid, Venous: 0.7 mmol/L (ref 0.5–1.9)
Lactic Acid, Venous: 0.7 mmol/L (ref 0.5–1.9)

## 2023-08-22 MED ORDER — ROSUVASTATIN CALCIUM 10 MG PO TABS
40.0000 mg | ORAL_TABLET | Freq: Every day | ORAL | Status: DC
  Administered 2023-08-22 – 2023-08-24 (×4): 40 mg via ORAL
  Filled 2023-08-22: qty 2
  Filled 2023-08-22 (×3): qty 4

## 2023-08-22 MED ORDER — OXCARBAZEPINE 150 MG PO TABS: 150.0000 mg | ORAL_TABLET | Freq: Three times a day (TID) | ORAL | 3 refills | Status: AC

## 2023-08-22 MED ORDER — ENOXAPARIN SODIUM 40 MG/0.4ML IJ SOSY
40.0000 mg | PREFILLED_SYRINGE | INTRAMUSCULAR | Status: DC
  Administered 2023-08-22 – 2023-08-24 (×3): 40 mg via SUBCUTANEOUS
  Filled 2023-08-22 (×3): qty 0.4

## 2023-08-22 MED ORDER — ACYCLOVIR 400 MG PO TABS
400.0000 mg | ORAL_TABLET | Freq: Every day | ORAL | Status: DC
  Administered 2023-08-22 – 2023-08-24 (×3): 400 mg via ORAL
  Filled 2023-08-22 (×5): qty 1

## 2023-08-22 MED ORDER — LEVOTHYROXINE SODIUM 25 MCG PO TABS
25.0000 ug | ORAL_TABLET | Freq: Every evening | ORAL | Status: DC
  Administered 2023-08-22 – 2023-08-24 (×3): 25 ug via ORAL
  Filled 2023-08-22 (×3): qty 1

## 2023-08-22 MED ORDER — ASPIRIN 81 MG PO TBEC
81.0000 mg | DELAYED_RELEASE_TABLET | Freq: Every day | ORAL | Status: DC
  Administered 2023-08-22 – 2023-08-24 (×3): 81 mg via ORAL
  Filled 2023-08-22 (×3): qty 1

## 2023-08-22 MED ORDER — CHLORHEXIDINE GLUCONATE CLOTH 2 % EX PADS
6.0000 | MEDICATED_PAD | Freq: Every day | CUTANEOUS | Status: DC
  Administered 2023-08-22 – 2023-08-24 (×3): 6 via TOPICAL

## 2023-08-22 MED ORDER — FESOTERODINE FUMARATE ER 4 MG PO TB24
4.0000 mg | ORAL_TABLET | Freq: Every day | ORAL | Status: DC
  Administered 2023-08-22: 4 mg via ORAL
  Filled 2023-08-22 (×5): qty 1

## 2023-08-22 MED ORDER — BACLOFEN 10 MG PO TABS
10.0000 mg | ORAL_TABLET | Freq: Four times a day (QID) | ORAL | Status: DC
  Administered 2023-08-22 – 2023-08-24 (×12): 10 mg via ORAL
  Filled 2023-08-22 (×14): qty 1

## 2023-08-22 MED ORDER — DANTROLENE SODIUM 50 MG PO CAPS: 50.0000 mg | ORAL_CAPSULE | Freq: Three times a day (TID) | ORAL | 3 refills | Status: DC | PRN

## 2023-08-22 MED ORDER — ONDANSETRON HCL 4 MG PO TABS: 4.0000 mg | ORAL_TABLET | Freq: Four times a day (QID) | ORAL | Status: DC | PRN

## 2023-08-22 MED ORDER — OXCARBAZEPINE 150 MG PO TABS
150.0000 mg | ORAL_TABLET | Freq: Three times a day (TID) | ORAL | Status: DC
  Administered 2023-08-22 – 2023-08-25 (×10): 150 mg via ORAL
  Filled 2023-08-22 (×13): qty 1

## 2023-08-22 MED ORDER — ONDANSETRON HCL 4 MG/2ML IJ SOLN: 4.0000 mg | Freq: Four times a day (QID) | INTRAMUSCULAR | Status: DC | PRN

## 2023-08-22 MED ORDER — FAMOTIDINE 20 MG PO TABS
40.0000 mg | ORAL_TABLET | Freq: Two times a day (BID) | ORAL | Status: DC
  Administered 2023-08-22 – 2023-08-24 (×7): 40 mg via ORAL
  Filled 2023-08-22 (×9): qty 2

## 2023-08-22 MED ORDER — FLUOXETINE HCL 20 MG PO CAPS
20.0000 mg | ORAL_CAPSULE | Freq: Three times a day (TID) | ORAL | Status: DC
  Administered 2023-08-22 – 2023-08-25 (×10): 20 mg via ORAL
  Filled 2023-08-22 (×11): qty 1

## 2023-08-22 MED ORDER — LABETALOL HCL 100 MG PO TABS
50.0000 mg | ORAL_TABLET | Freq: Every day | ORAL | Status: DC
  Administered 2023-08-22 – 2023-08-24 (×3): 50 mg via ORAL
  Filled 2023-08-22: qty 1
  Filled 2023-08-22 (×3): qty 0.5

## 2023-08-22 MED ORDER — SODIUM CHLORIDE 0.9 % IV SOLN
2.0000 g | INTRAVENOUS | Status: DC
Start: 2023-08-22 — End: 2023-08-25
  Administered 2023-08-22 – 2023-08-25 (×4): 2 g via INTRAVENOUS
  Filled 2023-08-22 (×4): qty 20

## 2023-08-22 MED ORDER — QUETIAPINE FUMARATE 25 MG PO TABS
25.0000 mg | ORAL_TABLET | Freq: Every day | ORAL | Status: DC
  Administered 2023-08-22 – 2023-08-24 (×4): 25 mg via ORAL
  Filled 2023-08-22 (×4): qty 1

## 2023-08-22 MED ORDER — SALINE SPRAY 0.65 % NA SOLN
1.0000 | NASAL | Status: DC | PRN
  Administered 2023-08-22: 1 via NASAL
  Filled 2023-08-22 (×2): qty 44

## 2023-08-22 MED ORDER — IBUPROFEN 200 MG PO TABS: 400.0000 mg | ORAL_TABLET | Freq: Four times a day (QID) | ORAL | Status: DC | PRN

## 2023-08-22 MED ORDER — MESALAMINE ER 0.375 G PO CP24: 0.3750 g | ORAL_CAPSULE | Freq: Four times a day (QID) | ORAL | Status: DC

## 2023-08-22 NOTE — Sepsis Progress Note (Signed)
 Notified bedside nurse of need to draw lactic acid.

## 2023-08-22 NOTE — Progress Notes (Signed)
 PROGRESS NOTE    Vanessa Short  FMW:[id number] DOB: 1966/07/07 DOA: 08/21/2023 PCP: [name], [name]   Brief Narrative:  Vanessa Short is a 58 y.o. female with medical history significant of multiple sclerosis, ulcerative colitis on mesalamine , CKD stage IIIa, hypertension, hyperlipidemia who presents emergency department due to cough fever congestion.  Patient was seen and side facility due to dysuria and was given fosfomycin.  Patient states that despite taking this medication she has had persistent dysuria and fevers at home.  Hospitalist called for admission   Assessment & Plan:    UTI, POA Failure of outpatient antibiotics -Patient does not meet sepsis criteria -Reportedly failed fosfomycin and previous unspecified antibiotic -UA continues to be markedly abnormal, culture pending, concern growth will be limited by prior antibiotic course -Appears to be improving on ceftriaxone , transition to p.o. Keflex  in the next 24 to 48 hours if she continues to improve  CKD 3A -Complete IV fluids, transition to p.o. fluids as tolerated given shortage Urine output appropriate  Ulcerative colitis Mood disorder Hyperlipidemia Hypothyroidism Hypertension CAD Multiple sclerosis -Continue home medications, appear to be at baseline   DVT prophylaxis: enoxaparin  (LOVENOX ) injection 40 mg Start: 08/22/23 1000 SCDs Start: 08/22/23 0025   Code Status:   Code Status: Full Code  Family Communication: At bedside  Status is: Inpatient  Dispo: The patient is from: Home              Anticipated d/c is to: Home              Anticipated d/c date is: 24 to 48 hours              Patient currently not medically stable for discharge  Consultants:  None  Procedures:  None  Antimicrobials:  Ceftriaxone   Subjective: No acute issues or events overnight  Objective: Vitals:   08/22/23 0515 08/22/23 0745 08/22/23 0915 08/22/23 1045  BP: 128/63 (!) 146/70  (!) 109/92  Pulse:  91 95 95 91  Resp: 18 20 16 19   Temp:   98.2 F (36.8 C)   TempSrc:   Oral   SpO2: 97% 96% 95% 96%   No intake or output data in the 24 hours ending 08/22/23 1408 There were no vitals filed for this visit.  Examination:  General:  Pleasantly resting in bed, No acute distress. HEENT:  Normocephalic atraumatic.  Sclerae nonicteric, noninjected.  Extraocular movements intact bilaterally. Neck:  Without mass or deformity.  Trachea is midline. Lungs:  Clear to auscultate bilaterally without rhonchi, wheeze, or rales. Heart:  Regular rate and rhythm.  Without murmurs, rubs, or gallops. Abdomen:  Soft, nontender, nondistended.  Without guarding or rebound. Extremities: Without cyanosis, clubbing, edema, or obvious deformity. Skin:  Warm and dry, no erythema.  Data Reviewed: I have personally reviewed following labs and imaging studies  CBC: Recent Labs  Lab 08/21/23 1237 08/22/23 0449  WBC 14.2* 15.6*  NEUTROABS 11.2*  --   HGB 9.4* 8.1*  HCT 30.5* 26.6*  MCV 101.0* 101.9*  PLT 182 187   Basic Metabolic Panel: Recent Labs  Lab 08/21/23 1237 08/22/23 0449  NA 138 138  K 4.3 3.9  CL 111 111  CO2 14* 14*  GLUCOSE 123* 105*  BUN 44* 39*  CREATININE 2.06* 2.01*  CALCIUM  9.2 8.6*   GFR: CrCl cannot be calculated (Unknown ideal weight.).  Liver Function Tests: Recent Labs  Lab 08/21/23 1237  AST 18  ALT 27  ALKPHOS 153*  BILITOT  0.8  PROT 7.5  ALBUMIN 3.6   Coagulation Profile: Recent Labs  Lab 08/21/23 2330  INR 1.2   Sepsis Labs: Recent Labs  Lab 08/21/23 2339 08/22/23 0058  LATICACIDVEN 0.7 0.7    Recent Results (from the past 240 hours)  Resp panel by RT-PCR (RSV, Flu A&B, Covid) Anterior Nasal Swab     Status: None   Collection Time: 08/21/23 12:37 PM   Specimen: Anterior Nasal Swab  Result Value Ref Range Status   SARS Coronavirus 2 by RT PCR NEGATIVE NEGATIVE Final    Comment: (NOTE) SARS-CoV-2 target nucleic acids are NOT DETECTED.  The  SARS-CoV-2 RNA is generally detectable in upper respiratory specimens during the acute phase of infection. The lowest concentration of SARS-CoV-2 viral copies this assay can detect is 138 copies/mL. A negative result does not preclude SARS-Cov-2 infection and should not be used as the sole basis for treatment or other patient management decisions. A negative result may occur with  improper specimen collection/handling, submission of specimen other than nasopharyngeal swab, presence of viral mutation(s) within the areas targeted by this assay, and inadequate number of viral copies(<138 copies/mL). A negative result must be combined with clinical observations, patient history, and epidemiological information. The expected result is Negative.  Fact Sheet for Patients:  bloggercourse.com  Fact Sheet for Healthcare Providers:  seriousbroker.it  This test is no t yet approved or cleared by the [place]  FDA and  has been authorized for detection and/or diagnosis of SARS-CoV-2 by FDA under an Emergency Use Authorization (EUA). This EUA will remain  in effect (meaning this test can be used) for the duration of the COVID-19 declaration under Section 564(b)(1) of the Act, 21 U.S.C.section 360bbb-3(b)(1), unless the authorization is terminated  or revoked sooner.       Influenza A by PCR NEGATIVE NEGATIVE Final   Influenza B by PCR NEGATIVE NEGATIVE Final    Comment: (NOTE) The Xpert Xpress SARS-CoV-2/FLU/RSV plus assay is intended as an aid in the diagnosis of influenza from Nasopharyngeal swab specimens and should not be used as a sole basis for treatment. Nasal washings and aspirates are unacceptable for Xpert Xpress SARS-CoV-2/FLU/RSV testing.  Fact Sheet for Patients: bloggercourse.com  Fact Sheet for Healthcare Providers: seriousbroker.it  This test is not yet approved or  cleared by the [place]  FDA and has been authorized for detection and/or diagnosis of SARS-CoV-2 by FDA under an Emergency Use Authorization (EUA). This EUA will remain in effect (meaning this test can be used) for the duration of the COVID-19 declaration under Section 564(b)(1) of the Act, 21 U.S.C. section 360bbb-3(b)(1), unless the authorization is terminated or revoked.     Resp Syncytial Virus by PCR NEGATIVE NEGATIVE Final    Comment: (NOTE) Fact Sheet for Patients: bloggercourse.com  Fact Sheet for Healthcare Providers: seriousbroker.it  This test is not yet approved or cleared by the [place]  FDA and has been authorized for detection and/or diagnosis of SARS-CoV-2 by FDA under an Emergency Use Authorization (EUA). This EUA will remain in effect (meaning this test can be used) for the duration of the COVID-19 declaration under Section 564(b)(1) of the Act, 21 U.S.C. section 360bbb-3(b)(1), unless the authorization is terminated or revoked.  Performed at [facility], [address], [place], [place] [address]          Radiology Studies: DG Chest 2 View Result Date: 08/21/2023 CLINICAL DATA:  Cough. EXAM: CHEST - 2 VIEW COMPARISON:  04/21/2023. FINDINGS: Redemonstration of atelectatic changes  at the left lung base. There is elevated left hemidiaphragm. Bilateral lung fields are otherwise clear. Bilateral costophrenic angles are clear. Stable cardio-mediastinal silhouette. No acute osseous abnormalities. The soft tissues are within normal limits. Right-sided CT Port-A-Cath is seen with its tip overlying the midportion of superior vena cava. IMPRESSION: No active cardiopulmonary disease. Electronically Signed   By: Ree Molt M.D.   On: 08/21/2023 13:05    Scheduled Meds:  acyclovir   400 mg Oral Daily   aspirin  EC  81 mg Oral Daily   baclofen   10 mg Oral QID   enoxaparin  (LOVENOX ) injection   40 mg Subcutaneous Q24H   famotidine   40 mg Oral BID   fesoterodine   4 mg Oral Daily   FLUoxetine   20 mg Oral TID   labetalol   50 mg Oral Daily   levothyroxine   25 mcg Oral QPM   lidocaine -prilocaine    Topical Once   mesalamine   0.375 g Oral QID   OXcarbazepine   150 mg Oral TID   QUEtiapine   25 mg Oral QHS   rosuvastatin   40 mg Oral QHS   Continuous Infusions:  cefTRIAXone  (ROCEPHIN )  IV Stopped (08/22/23 0120)   lactated ringers  150 mL/hr at 08/22/23 1302     LOS: 0 days   Time spent:  Elsie JAYSON Mont[place], DO [facility]  If 7PM-7AM, please contact night-coverage [email or web address]  08/22/2023, 2:08 PM

## 2023-08-22 NOTE — H&P (Signed)
 History and Physical    Vanessa Short FMW:[id number] DOB: 07-04-1966 DOA: 08/21/2023  PCP: [name], [name]   Chief Complaint:  cough, fever  HPI: Vanessa Short is a 58 y.o. female with medical history significant of multiple sclerosis, ulcerative colitis on mesalamine , CKD stage IIIa, hypertension, hyperlipidemia who presents emergency department due to cough fever congestion.  Patient was seen and side facility due to dysuria and was given fosfomycin.  Patient states that despite taking this medication she has had persistent dysuria and fevers at home.  She presents emergency department further assessment.  On arrival temperature was 99.9.  She was found to be hypoxic satting 89% as well as having a productive cough.  Labs were obtained on presentation which showed urinalysis concerning for infection, WBC 14.2, hemoglobin 9.4, platelets 182, creatinine 2.0 at baseline, bicarb 14, COVID flu negative.  INR 1.2.  Patient underwent chest x-ray which demonstrated no acute disease.  Patient was admitted for further workup.  She was started on aztreonam .  On admission she had a notable active cough endorsed dysuria.  She was transitioned to ceftriaxone .  She was given IV fluids and admitted for further workup.    Review of Systems: Review of Systems  Constitutional:  Positive for fever. Negative for chills and weight loss.  HENT: Negative.    Eyes: Negative.   Respiratory: Negative.    Cardiovascular: Negative.   Gastrointestinal: Negative.   Genitourinary: Negative.   Musculoskeletal: Negative.   Skin: Negative.   Neurological: Negative.   Endo/Heme/Allergies: Negative.   Psychiatric/Behavioral: Negative.       As per HPI otherwise 10 point review of systems negative.   Allergies  Allergen Reactions   Nitrofurantoin Other (See Comments)    syncope   Oxycodone -Acetaminophen  Other (See Comments)   Risperidone And Related Other (See Comments)    Became very lethargic    Penicillins Rash    Did it involve swelling of the face/tongue/throat, SOB, or low BP? No Did it involve sudden or severe rash/hives, skin peeling, or any reaction on the inside of your mouth or nose? Yes Did you need to seek medical attention at a hospital or doctor's office? No When did it last happen? Over 30 Years      If all above answers are NO, may proceed with cephalosporin use.      Past Medical History:  Diagnosis Date   Headache    Herpes genitalis in women    History of kidney stones    Hypertension    Kidney stones    Movement disorder    Multiple sclerosis (HCC)    Neuropathy    Oral herpes    Ulcerative colitis (HCC)    Vision abnormalities     Past Surgical History:  Procedure Laterality Date   ENDOMETRIAL ABLATION  10/10/2017   KIDNEY STONE SURGERY     PORTACATH PLACEMENT N/A 10/16/2017   Procedure: ULTRA SOUND GUIDED INSERTION PORT-A-CATH ERAS PATHWAY;  Surgeon: Stevie Herlene Righter, MD;  Location: [facility];  Service: General;  Laterality: N/A;     reports that she has never smoked. She has never used smokeless tobacco. She reports current alcohol use. She reports that she does not use drugs.  Family History  Problem Relation Age of Onset   Dementia Mother    Hypertension Father    Hyperlipidemia Father    Diabetes Father    Heart disease Father    Breast cancer Neg Hx     Prior to  Admission medications   Medication Sig Start Date End Date Taking? Authorizing Provider  acyclovir  (ZOVIRAX ) 400 MG tablet Take 400 mg by mouth daily. Take every day per patient 08/11/14  Yes [provider]  aspirin  EC 81 MG tablet Take 81 mg by mouth daily.   Yes [provider]  b complex vitamins tablet Take 1 tablet by mouth daily.    Yes [provider]  baclofen  (LIORESAL ) 10 MG tablet Take 1 tablet (10 mg total) by mouth 4 (four) times daily. 1 to 2 po qid up to 8/day Patient taking differently: Take 10 mg by mouth 4 (four) times daily.  04/25/23  Yes Briana Elgin LABOR, MD  cholecalciferol  (VITAMIN D ) 1000 UNITS tablet Take 1,000 Units by mouth daily.    Yes [provider]  dantrolene  (DANTRIUM ) 50 MG capsule Take 1 capsule (50 mg total) by mouth 3 (three) times daily as needed. 04/21/23  Yes Jillian Buttery, MD  famotidine  (PEPCID ) 40 MG tablet Take 40 mg by mouth 2 (two) times daily. 02/28/23  Yes [provider]  ferrous sulfate  325 (65 FE) MG tablet Take 325 mg by mouth daily with breakfast. 08/02/23  Yes [provider]  FLUoxetine  (PROZAC ) 20 MG capsule Take 20 mg by mouth 3 (three) times daily.   Yes [provider]  folic acid  (FOLVITE ) 400 MCG tablet Take 400 mcg by mouth daily.   Yes [provider]  furosemide  (LASIX ) 40 MG tablet Take 0.5 tablets (20 mg total) by mouth 2 (two) times daily. 04/25/23  Yes Briana Elgin LABOR, MD  labetalol  (NORMODYNE ) 100 MG tablet TAKE 1/2 TABLET BY MOUTH DAILY 01/10/23  Yes Sater, Charlie LABOR, MD  levothyroxine  (SYNTHROID ) 25 MCG tablet Take 25 mcg by mouth every evening. 08/02/23 08/01/24 Yes [provider]  lidocaine -prilocaine  (EMLA ) cream Apply one inch before port-a cath access prn Patient taking differently: Apply 1 Application topically daily as needed (port access). 03/23/21  Yes Sater, Charlie LABOR, MD  mesalamine  (APRISO ) 0.375 g 24 hr capsule Take 375 mg by mouth QID.   Yes [provider]  Multiple Vitamin (MULTIVITAMIN) tablet Take 1 tablet by mouth daily.   Yes [provider]  OXcarbazepine  (TRILEPTAL ) 150 MG tablet Take 1 tablet (150 mg total) by mouth 3 (three) times daily. 12/04/22  Yes Sater, Charlie LABOR, MD  potassium chloride  (KLOR-CON ) 10 MEQ tablet Take 2 tablets (20 mEq total) by mouth daily. Take 4 pills daily for 3 days then continue taking as before Patient taking differently: Take 10 mEq by mouth daily. 04/22/23  Yes Jillian Buttery, MD  rosuvastatin  (CRESTOR ) 40 MG tablet Take 40 mg by mouth at bedtime.   Yes  [provider]  sodium chloride  (OCEAN) 0.65 % SOLN nasal spray Place 1 spray into both nostrils as needed for congestion. Patient taking differently: Place 1 spray into both nostrils daily as needed for congestion. 08/19/16  Yes Rai, Ripudeep K, MD  Teriflunomide  14 MG TABS Take 1 tablet (14 mg total) by mouth daily. HOLD until cleared by neurology outpatient to resume. 04/21/23  Yes Jillian Buttery, MD  tolterodine  (DETROL ) 2 MG tablet Take 2 mg by mouth 2 (two) times daily.  05/09/18  Yes [provider]  lubiprostone  (AMITIZA ) 8 MCG capsule Take 8 mcg by mouth 2 (two) times daily with a meal.    [provider]  QUEtiapine  (SEROQUEL ) 25 MG tablet One po qHS and one po every day prn Patient taking differently: Take 25  mg by mouth at bedtime. 02/12/23   Vear Charlie LABOR, MD    Physical Exam: Vitals:   08/21/23 2011 08/21/23 2245 08/21/23 2300 08/22/23 0024  BP: 125/69 133/68  135/62  Pulse: 96 98 98 96  Resp: 16   20  Temp: 98.4 F (36.9 C)   98.4 F (36.9 C)  TempSrc: Oral   Oral  SpO2: 93% 96% 95% 94%   Physical Exam Vitals reviewed.  Constitutional:      Appearance: She is normal weight.  HENT:     Head: Normocephalic.     Nose: Nose normal.     Mouth/Throat:     Mouth: Mucous membranes are moist.     Pharynx: Oropharynx is clear.  Eyes:     Conjunctiva/sclera: Conjunctivae normal.     Pupils: Pupils are equal, round, and reactive to light.  Cardiovascular:     Rate and Rhythm: Normal rate and regular rhythm.     Pulses: Normal pulses.     Heart sounds: Normal heart sounds.  Pulmonary:     Effort: Pulmonary effort is normal.  Abdominal:     General: Abdomen is flat. Bowel sounds are normal.  Musculoskeletal:        General: Normal range of motion.     Cervical back: Normal range of motion.  Skin:    General: Skin is warm.     Capillary Refill: Capillary refill takes less than 2 seconds.  Neurological:     General: No focal deficit present.      Mental Status: She is alert. Mental status is at baseline.  Psychiatric:        Mood and Affect: Mood normal.        Labs on Admission: I have personally reviewed the patients's labs and imaging studies.  Assessment/Plan Principal Problem:   Hypoxia   # Urinary tract infection-patient was recently given fosfomycin without improvement in her dysuria and symptoms.  She was febrile at home.  Will plan to transition to ceftriaxone  and continue toviaz   # Acute hypoxic respiratory failure most likely due to viral etiology -cough and fever -patient does not have infiltrate on chest x-ray.   -Etiology of her symptoms likely viral  Plan: RT eval and treat Continue to monitor with low threshold to pursue CT chest and broaden antibiotics  # Ulcerative colitis-continue home mesalamine   # Mood disorder-continue Seroquel , Trileptal , prozac   # Hyperlipidemia-continue rosuvastatin   # Hypothyroidism-continue levothyroxine   # Hypertension-continue labetalol   # CAD-continue aspirin   # CKD stage IIIa-trend creatinine  # Multiple sclerosis-patient is wheelchair-bound at baseline.  Will plan to continue baclofen  and Trileptal    Admission status: Inpatient Telemetry  Certification: The appropriate patient status for this patient is INPATIENT. Inpatient status is judged to be reasonable and necessary in order to provide the required intensity of service to ensure the patient's safety. The patient's presenting symptoms, physical exam findings, and initial radiographic and laboratory data in the context of their chronic comorbidities is felt to place them at high risk for further clinical deterioration. Furthermore, it is not anticipated that the patient will be medically stable for discharge from the hospital within 2 midnights of admission.   * I certify that at the point of admission it is my clinical judgment that the patient will require inpatient hospital care spanning beyond 2  midnights from the point of admission due to high intensity of service, high risk for further deterioration and high frequency of surveillance required.DEWAINE Lamar Dess MD  [facility] If 7PM-7AM, please contact night-coverage [email or web address]  08/22/2023, 12:27 AM

## 2023-08-23 DIAGNOSIS — R0902 Hypoxemia: Secondary | ICD-10-CM | POA: Diagnosis not present

## 2023-08-23 LAB — MRSA NEXT GEN BY PCR, NASAL: MRSA by PCR Next Gen: NOT DETECTED

## 2023-08-23 LAB — COMPREHENSIVE METABOLIC PANEL
ALT: 20 U/L (ref 0–44)
AST: 18 U/L (ref 15–41)
Albumin: 2.4 g/dL — ABNORMAL LOW (ref 3.5–5.0)
Alkaline Phosphatase: 120 U/L (ref 38–126)
Anion gap: 10 (ref 5–15)
BUN: 25 mg/dL — ABNORMAL HIGH (ref 6–20)
CO2: 17 mmol/L — ABNORMAL LOW (ref 22–32)
Calcium: 8.3 mg/dL — ABNORMAL LOW (ref 8.9–10.3)
Chloride: 115 mmol/L — ABNORMAL HIGH (ref 98–111)
Creatinine, Ser: 1.52 mg/dL — ABNORMAL HIGH (ref 0.44–1.00)
GFR, Estimated: 40 mL/min — ABNORMAL LOW (ref >60)
Glucose, Bld: 114 mg/dL — ABNORMAL HIGH (ref 70–99)
Potassium: 3.6 mmol/L (ref 3.5–5.1)
Sodium: 142 mmol/L (ref 135–145)
Total Bilirubin: 0.3 mg/dL (ref 0.0–1.2)
Total Protein: 5.7 g/dL — ABNORMAL LOW (ref 6.5–8.1)

## 2023-08-23 LAB — CBC
HCT: 23.8 % — ABNORMAL LOW (ref 36.0–46.0)
Hemoglobin: 7.2 g/dL — ABNORMAL LOW (ref 12.0–15.0)
MCH: 30.8 pg (ref 26.0–34.0)
MCHC: 30.3 g/dL (ref 30.0–36.0)
MCV: 101.7 fL — ABNORMAL HIGH (ref 80.0–100.0)
Platelets: 182 10*3/uL (ref 150–400)
RBC: 2.34 MIL/uL — ABNORMAL LOW (ref 3.87–5.11)
RDW: 16.4 % — ABNORMAL HIGH (ref 11.5–15.5)
WBC: 9.5 10*3/uL (ref 4.0–10.5)
nRBC: 0 % (ref 0.0–0.2)

## 2023-08-23 MED ORDER — ARFORMOTEROL TARTRATE 15 MCG/2ML IN NEBU
15.0000 ug | INHALATION_SOLUTION | Freq: Two times a day (BID) | RESPIRATORY_TRACT | Status: DC
  Administered 2023-08-23 – 2023-08-25 (×5): 15 ug via RESPIRATORY_TRACT
  Filled 2023-08-23 (×4): qty 2

## 2023-08-23 MED ORDER — BUDESONIDE 0.5 MG/2ML IN SUSP
0.5000 mg | Freq: Two times a day (BID) | RESPIRATORY_TRACT | Status: DC
  Administered 2023-08-23 – 2023-08-25 (×5): 0.5 mg via RESPIRATORY_TRACT
  Filled 2023-08-23 (×5): qty 2

## 2023-08-23 MED ORDER — SODIUM CHLORIDE 0.9% FLUSH: 10.0000 mL | INTRAVENOUS | Status: DC | PRN

## 2023-08-23 MED ORDER — IPRATROPIUM-ALBUTEROL 0.5-2.5 (3) MG/3ML IN SOLN: 3.0000 mL | RESPIRATORY_TRACT | Status: DC | PRN

## 2023-08-23 MED ORDER — IPRATROPIUM-ALBUTEROL 0.5-2.5 (3) MG/3ML IN SOLN
3.0000 mL | Freq: Four times a day (QID) | RESPIRATORY_TRACT | Status: DC
  Administered 2023-08-23 – 2023-08-24 (×7): 3 mL via RESPIRATORY_TRACT
  Filled 2023-08-23 (×7): qty 3

## 2023-08-23 MED ORDER — GUAIFENESIN 100 MG/5ML PO LIQD
5.0000 mL | ORAL | Status: DC | PRN
  Administered 2023-08-23 – 2023-08-24 (×2): 5 mL via ORAL
  Filled 2023-08-23 (×2): qty 10

## 2023-08-23 NOTE — Plan of Care (Signed)

## 2023-08-23 NOTE — Progress Notes (Signed)
  Progress Note   [name] FMW:[id number] DOB: 25-May-1966 DOA: 08/21/2023     1 DOS: the patient was seen and examined on 08/23/2023   Brief hospital course: Lalana A Pola is a 58 y.o. female with medical history significant of multiple sclerosis, ulcerative colitis on mesalamine , CKD stage IIIa, hypertension, hyperlipidemia who presents emergency department due to cough fever congestion.  Patient was seen and side facility due to dysuria and was given fosfomycin.  Patient states that despite taking this medication she has had persistent dysuria and fevers at home.  Hospitalist called for admission   Assessment and Plan: UTI (failed outpt antibx)  - IV ceftriaxone  2 g daily   CKD3a - Kidney function improved (44/2.06 --> 25/1.52)  SOB - Pt on room air but is congested (nasal swab was negative x 4)  - Pulmicort /brovana  bid  - Duoneb q6 hr and q4 hr PRN   4. HTN  - Labetalol  50 mg PO daily   5. HLD - Crestor  40 mg PO at bedtime       Subjective: Pt seen and examined at the bedside. WBC downtrended 15.6 -->9.5.  Kidney function also improved. Pt request nebulizer's today for SOB.  Physical Exam: Vitals:   08/23/23 0636 08/23/23 0935 08/23/23 0954 08/23/23 0959  BP: (!) 137/54 (!) 137/54    Pulse: 89 89    Resp: 18     Temp: 97.8 F (36.6 C)     TempSrc: Oral     SpO2: 98%  99% 99%   Physical Exam HENT:     Head: Normocephalic.     Mouth/Throat:     Mouth: Mucous membranes are moist.  Cardiovascular:     Rate and Rhythm: Normal rate and regular rhythm.  Pulmonary:     Effort: Pulmonary effort is normal.  Abdominal:     Palpations: Abdomen is soft.  Musculoskeletal:     Cervical back: Neck supple.  Skin:    General: Skin is warm.  Neurological:     Mental Status: She is alert and oriented to person, place, and time.  Psychiatric:        Mood and Affect: Mood normal.     Disposition: Status is: Inpatient Remains inpatient appropriate because:  IV antibx and breathing tx  Planned Discharge Destination: Home    Time spent: 35 minutes  Author: Adrie Picking , MD 08/23/2023 12:20 PM  For on call review [email or web address].

## 2023-08-23 NOTE — Plan of Care (Signed)

## 2023-08-24 DIAGNOSIS — D500 Iron deficiency anemia secondary to blood loss (chronic): Secondary | ICD-10-CM

## 2023-08-24 DIAGNOSIS — K921 Melena: Secondary | ICD-10-CM

## 2023-08-24 LAB — VITAMIN B12: Vitamin B-12: 1448 pg/mL — ABNORMAL HIGH (ref 180–914)

## 2023-08-24 LAB — COMPREHENSIVE METABOLIC PANEL
ALT: 24 U/L (ref 0–44)
AST: 26 U/L (ref 15–41)
Albumin: 2.6 g/dL — ABNORMAL LOW (ref 3.5–5.0)
Alkaline Phosphatase: 120 U/L (ref 38–126)
Anion gap: 8 (ref 5–15)
BUN: 23 mg/dL — ABNORMAL HIGH (ref 6–20)
CO2: 17 mmol/L — ABNORMAL LOW (ref 22–32)
Calcium: 8 mg/dL — ABNORMAL LOW (ref 8.9–10.3)
Chloride: 115 mmol/L — ABNORMAL HIGH (ref 98–111)
Creatinine, Ser: 1.4 mg/dL — ABNORMAL HIGH (ref 0.44–1.00)
GFR, Estimated: 44 mL/min — ABNORMAL LOW (ref >60)
Glucose, Bld: 96 mg/dL (ref 70–99)
Potassium: 3 mmol/L — ABNORMAL LOW (ref 3.5–5.1)
Sodium: 140 mmol/L (ref 135–145)
Total Bilirubin: 0.5 mg/dL (ref 0.0–1.2)
Total Protein: 5.5 g/dL — ABNORMAL LOW (ref 6.5–8.1)

## 2023-08-24 LAB — IRON AND TIBC
Iron: 34 ug/dL (ref 28–170)
Saturation Ratios: 18 % (ref 10.4–31.8)
TIBC: 195 ug/dL — ABNORMAL LOW (ref 250–450)
UIBC: 161 ug/dL

## 2023-08-24 LAB — PREPARE RBC (CROSSMATCH)

## 2023-08-24 LAB — FOLATE: Folate: 39 ng/mL (ref >5.9)

## 2023-08-24 LAB — CBC
HCT: 22.2 % — ABNORMAL LOW (ref 36.0–46.0)
Hemoglobin: 6.8 g/dL — CL (ref 12.0–15.0)
MCH: 30.9 pg (ref 26.0–34.0)
MCHC: 30.6 g/dL (ref 30.0–36.0)
MCV: 100.9 fL — ABNORMAL HIGH (ref 80.0–100.0)
Platelets: 173 10*3/uL (ref 150–400)
RBC: 2.2 MIL/uL — ABNORMAL LOW (ref 3.87–5.11)
RDW: 16.3 % — ABNORMAL HIGH (ref 11.5–15.5)
WBC: 7.9 10*3/uL (ref 4.0–10.5)
nRBC: 0 % (ref 0.0–0.2)

## 2023-08-24 LAB — ABO/RH: ABO/RH(D): A POS

## 2023-08-24 LAB — RETICULOCYTES
Immature Retic Fract: 23.8 % — ABNORMAL HIGH (ref 2.3–15.9)
RBC.: 2.4 MIL/uL — ABNORMAL LOW (ref 3.87–5.11)
Retic Count, Absolute: 77.5 10*3/uL (ref 19.0–186.0)
Retic Ct Pct: 3.2 % — ABNORMAL HIGH (ref 0.4–3.1)

## 2023-08-24 LAB — FERRITIN: Ferritin: 236 ng/mL (ref 11–307)

## 2023-08-24 MED ORDER — IPRATROPIUM-ALBUTEROL 0.5-2.5 (3) MG/3ML IN SOLN
3.0000 mL | Freq: Three times a day (TID) | RESPIRATORY_TRACT | Status: DC
Start: 2023-08-25 — End: 2023-08-25
  Administered 2023-08-25: 3 mL via RESPIRATORY_TRACT
  Filled 2023-08-24: qty 3

## 2023-08-24 MED ORDER — SODIUM CHLORIDE 0.9% IV SOLUTION: Freq: Once | INTRAVENOUS | Status: DC

## 2023-08-24 MED ORDER — POTASSIUM CHLORIDE CRYS ER 20 MEQ PO TBCR
40.0000 meq | EXTENDED_RELEASE_TABLET | Freq: Once | ORAL | Status: AC
  Administered 2023-08-24: 40 meq via ORAL
  Filled 2023-08-24: qty 2

## 2023-08-24 NOTE — Progress Notes (Addendum)
  Progress Note   [name] FMW:[id number] DOB: Dec 29, 1965 DOA: 08/21/2023     2 DOS: the patient was seen and examined on 08/24/2023   Brief hospital course: Quilla A Judy is a 58 y.o. female with medical history significant of multiple sclerosis, ulcerative colitis on mesalamine , CKD stage IIIa, hypertension, hyperlipidemia who presents emergency department due to cough fever congestion.  Patient was seen at outside facility due to dysuria and was given fosfomycin.  Patient states that despite taking this medication she has had persistent dysuria and fevers at home. FOund to have declining Hgb while in-house as well as several episodes of melena.  GI consulted today.  Assessment and Plan: UTI (failed outpt antibx)  - IV ceftriaxone  2 g daily  -Improving from the standpoint  2.  Melena, concern for lower GI bleed - Hgb also dropping from 8-->7-->6.8 -Packed red blood cells ordered for last night.  However patient apparently has a port and requires special blood draw for type and screen.  Awaiting this once this has been completed we will transfuse 1 PRBC -No further melena since early this morning. -Recheck CBC in AM.  3.  Congestion: -Initially there was some concern for pneumonia but her cough is resolving. -Chest x-ray negative. -Nasal saline for sinus congestion.   #CKD3a - Kidney function improved (2.06 --> 1.52-->1.4)   #SOB - Pt on room air but is congested (nasal swab was negative x 4)  - Pulmicort /brovana  bid  - Duoneb q6 hr and q4 hr PRN    #. HTN  - Labetalol  50 mg PO daily    #. HLD - Crestor  40 mg PO at bedtime    Subjective: Overall feels fairly well today.  Concerned about melena yesterday.  Still with some ongoing congestion that is resolved with nasal saline sprays.  Physical Exam: Vitals:   08/24/23 1031 08/24/23 1423 08/24/23 1505 08/24/23 1505  BP: 132/69 (!) 110/53  (!) 138/59  Pulse: 79 74  78  Resp:  15    Temp:  98.2 F (36.8 C)  98.2 F (36.8 C)   TempSrc:   Oral   SpO2:  100%  98%   General:  Pleasantly resting in bed, No acute distress. HEENT:  Normocephalic atraumatic.  Sclerae nonicteric, noninjected.  Extraocular movements intact bilaterally. Neck:  Without mass or deformity.  Trachea is midline. Lungs:  Clear to auscultate bilaterally without rhonchi, wheeze, or rales. Heart:  Regular rate and rhythm.  Without murmurs, rubs, or gallops. Abdomen:  Soft, nontender, nondistended.  Without guarding or rebound. Extremities: Without cyanosis, clubbing, edema, or obvious deformity. Skin:  Warm and dry, no erythema.    Family Communication: Discussed with husband extensively at bedside.  Disposition: Status is: Inpatient Remains inpatient appropriate because: Ongoing melena  Planned Discharge Destination: Home    Time spent: 55 minutes  Author: Reyes VEAR Gaw, MD 08/24/2023 4:54 PM  For on call review [email or web address].

## 2023-08-24 NOTE — H&P (View-Only) (Signed)
 [facility] Consultation Note  Referring Provider: [facility] Primary Care Physician:  [name], [name] Primary Gastroenterologist: [facility]  Reason for Consultation:  melena  HPI: Vanessa Short is a 58 y.o. female admitted respiratory difficulties felt to have viral process.  Has had few days' of melena and drop hemoglobin.  No NSAIDs.  Last egd over 10 years ago.  History ulcerative colitis.  On mesalamine .  No diarrhea of hematochezia (except when straining can have some blood on tissue paper).   Past Medical History:  Diagnosis Date   Headache    Herpes genitalis in women    History of kidney stones    Hypertension    Kidney stones    Movement disorder    Multiple sclerosis (HCC)    Neuropathy    Oral herpes    Ulcerative colitis (HCC)    Vision abnormalities     Past Surgical History:  Procedure Laterality Date   ENDOMETRIAL ABLATION  10/10/2017   KIDNEY STONE SURGERY     PORTACATH PLACEMENT N/A 10/16/2017   Procedure: ULTRA SOUND GUIDED INSERTION PORT-A-CATH ERAS PATHWAY;  Surgeon: Stevie Herlene Righter, MD;  Location: [facility];  Service: General;  Laterality: N/A;    Prior to Admission medications   Medication Sig Start Date End Date Taking? Authorizing Provider  acyclovir  (ZOVIRAX ) 400 MG tablet Take 400 mg by mouth daily. Take every day per patient 08/11/14  Yes [provider]  aspirin  EC 81 MG tablet Take 81 mg by mouth daily.   Yes [provider]  b complex vitamins tablet Take 1 tablet by mouth daily.    Yes [provider]  baclofen  (LIORESAL ) 10 MG tablet Take 1 tablet (10 mg total) by mouth 4 (four) times daily. 1 to 2 po qid up to 8/day Patient taking differently: Take 10 mg by mouth 4 (four) times daily. 04/25/23  Yes Briana Elgin LABOR, MD  cholecalciferol  (VITAMIN D ) 1000 UNITS tablet Take 1,000 Units by mouth daily.    Yes [provider]  famotidine  (PEPCID ) 40 MG tablet Take  40 mg by mouth 2 (two) times daily. 02/28/23  Yes [provider]  ferrous sulfate  325 (65 FE) MG tablet Take 325 mg by mouth daily with breakfast. 08/02/23  Yes [provider]  FLUoxetine  (PROZAC ) 20 MG capsule Take 20 mg by mouth 3 (three) times daily.   Yes [provider]  folic acid  (FOLVITE ) 400 MCG tablet Take 400 mcg by mouth daily.   Yes [provider]  furosemide  (LASIX ) 40 MG tablet Take 0.5 tablets (20 mg total) by mouth 2 (two) times daily. 04/25/23  Yes Briana Elgin LABOR, MD  labetalol  (NORMODYNE ) 100 MG tablet TAKE 1/2 TABLET BY MOUTH DAILY 01/10/23  Yes Sater, Charlie LABOR, MD  levothyroxine  (SYNTHROID ) 25 MCG tablet Take 25 mcg by mouth every evening. 08/02/23 08/01/24 Yes [provider]  lidocaine -prilocaine  (EMLA ) cream Apply one inch before port-a cath access prn Patient taking differently: Apply 1 Application topically daily as needed (port access). 03/23/21  Yes Sater, Charlie LABOR, MD  mesalamine  (APRISO ) 0.375 g 24 hr capsule Take 375 mg by mouth QID.   Yes [provider]  Multiple Vitamin (MULTIVITAMIN) tablet Take 1 tablet by mouth daily.   Yes [provider]  potassium chloride  (KLOR-CON ) 10 MEQ tablet Take 2 tablets (20 mEq total) by mouth daily. Take 4 pills daily for 3 days then continue taking as before Patient taking differently: Take 10 mEq  by mouth daily. 04/22/23  Yes Jillian Buttery, MD  rosuvastatin  (CRESTOR ) 40 MG tablet Take 40 mg by mouth at bedtime.   Yes [provider]  sodium chloride  (OCEAN) 0.65 % SOLN nasal spray Place 1 spray into both nostrils as needed for congestion. Patient taking differently: Place 1 spray into both nostrils daily as needed for congestion. 08/19/16  Yes Rai, Ripudeep K, MD  Teriflunomide  14 MG TABS Take 1 tablet (14 mg total) by mouth daily. HOLD until cleared by neurology outpatient to resume. 04/21/23  Yes Jillian Buttery, MD  tolterodine  (DETROL ) 2 MG tablet Take 2 mg  by mouth 2 (two) times daily.  05/09/18  Yes [provider]  dantrolene  (DANTRIUM ) 50 MG capsule Take 1 capsule (50 mg total) by mouth 3 (three) times daily as needed. 08/22/23   Sater, Charlie LABOR, MD  lubiprostone  (AMITIZA ) 8 MCG capsule Take 8 mcg by mouth 2 (two) times daily with a meal.    [provider]  OXcarbazepine  (TRILEPTAL ) 150 MG tablet Take 1 tablet (150 mg total) by mouth 3 (three) times daily. 08/22/23   Sater, Charlie LABOR, MD  QUEtiapine  (SEROQUEL ) 25 MG tablet One po qHS and one po every day prn Patient taking differently: Take 25 mg by mouth at bedtime. 02/12/23   Sater, Charlie LABOR, MD    Current Facility-Administered Medications  Medication Dose Route Frequency Provider Last Rate Last Admin   0.9 %  sodium chloride  infusion (Manually program via Guardrails IV Fluids)   Intravenous Once Chavez, Abigail, NP       acyclovir  (ZOVIRAX ) tablet 400 mg  400 mg Oral Daily Dorrell, Robert, MD   400 mg at 08/24/23 1030   arformoterol  (BROVANA ) nebulizer solution 15 mcg  15 mcg Nebulization BID Sira, Zackery, MD   15 mcg at 08/24/23 0853   aspirin  EC tablet 81 mg  81 mg Oral Daily Dorrell, Robert, MD   81 mg at 08/24/23 9063   baclofen  (LIORESAL ) tablet 10 mg  10 mg Oral QID Dorrell, Robert, MD   10 mg at 08/24/23 9063   budesonide  (PULMICORT ) nebulizer solution 0.5 mg  0.5 mg Nebulization BID Sira, Zackery, MD   0.5 mg at 08/24/23 0853   cefTRIAXone  (ROCEPHIN ) 2 g in sodium chloride  0.9 % 100 mL IVPB  2 g Intravenous Q24H Dena Charleston, MD 200 mL/hr at 08/24/23 0127 2 g at 08/24/23 0127   Chlorhexidine  Gluconate Cloth 2 % PADS 6 each  6 each Topical Q2000 Lue Elsie BROCKS, MD   6 each at 08/23/23 1941   enoxaparin  (LOVENOX ) injection 40 mg  40 mg Subcutaneous Q24H Dena Charleston, MD   40 mg at 08/24/23 9063   famotidine  (PEPCID ) tablet 40 mg  40 mg Oral BID Dorrell, Robert, MD   40 mg at 08/24/23 9063   fesoterodine  (TOVIAZ ) tablet 4 mg  4 mg Oral Daily Dorrell, Charleston, MD    4 mg at 08/22/23 1352   FLUoxetine  (PROZAC ) capsule 20 mg  20 mg Oral TID Dorrell, Robert, MD   20 mg at 08/24/23 0936   guaiFENesin  (ROBITUSSIN) 100 MG/5ML liquid 5 mL  5 mL Oral Q4H PRN Sira, Zackery, MD   5 mL at 08/23/23 2240   ibuprofen  (ADVIL ) tablet 400 mg  400 mg Oral Q6H PRN Dena Charleston, MD       ipratropium-albuterol  (DUONEB) 0.5-2.5 (3) MG/3ML nebulizer solution 3 mL  3 mL Nebulization Q6H Sira, Zackery, MD   3 mL at 08/24/23 [phone]  ipratropium-albuterol  (DUONEB) 0.5-2.5 (3) MG/3ML nebulizer solution 3 mL  3 mL Nebulization Q4H PRN Sira, Zackery, MD       labetalol  (NORMODYNE ) tablet 50 mg  50 mg Oral Daily Dorrell, Robert, MD   50 mg at 08/24/23 1031   levothyroxine  (SYNTHROID ) tablet 25 mcg  25 mcg Oral QPM Dena Charleston, MD   25 mcg at 08/23/23 1700   lidocaine -prilocaine  (EMLA ) cream   Topical Once Dorrell, Robert, MD       ondansetron  (ZOFRAN ) tablet 4 mg  4 mg Oral Q6H PRN Dena Charleston, MD       Or   ondansetron  (ZOFRAN ) injection 4 mg  4 mg Intravenous Q6H PRN Dorrell, Robert, MD       OXcarbazepine  (TRILEPTAL ) tablet 150 mg  150 mg Oral TID Dena Charleston, MD   150 mg at 08/24/23 1032   QUEtiapine  (SEROQUEL ) tablet 25 mg  25 mg Oral QHS Dorrell, Robert, MD   25 mg at 08/23/23 2123   rosuvastatin  (CRESTOR ) tablet 40 mg  40 mg Oral QHS Dorrell, Robert, MD   40 mg at 08/23/23 2123   sodium chloride  (OCEAN) 0.65 % nasal spray 1 spray  1 spray Each Nare PRN Dena Charleston, MD   1 spray at 08/22/23 1737   sodium chloride  flush (NS) 0.9 % injection 10-40 mL  10-40 mL Intracatheter PRN Luz Skelton, MD        Allergies as of 08/21/2023 - Review Complete 08/21/2023  Allergen Reaction Noted   Nitrofurantoin Other (See Comments) 01/21/2020   Oxycodone -acetaminophen  Other (See Comments) 09/18/2016   Risperidone and related Other (See Comments) 07/08/2018   Penicillins Rash 05/30/2013    Family History  Problem Relation Age of Onset   Dementia Mother    Hypertension  Father    Hyperlipidemia Father    Diabetes Father    Heart disease Father    Breast cancer Neg Hx     Social History   Socioeconomic History   Marital status: Married    Spouse name: Elvie Palomo   Number of children: Not on file   Years of education: Not on file   Highest education level: Not on file  Occupational History   Not on file  Tobacco Use   Smoking status: Never   Smokeless tobacco: Never  Vaping Use   Vaping status: Never Used  Substance and Sexual Activity   Alcohol use: Yes    Comment: occasional glass of wine   Drug use: No   Sexual activity: Not on file  Other Topics Concern   Not on file  Social History Narrative   ** Merged History Encounter **       Social Drivers of Health   Financial Resource Strain: Not on file  Food Insecurity: No Food Insecurity (08/22/2023)   Hunger Vital Sign    Worried About Running Out of Food in the Last Year: Never true    Ran Out of Food in the Last Year: Never true  Transportation Needs: No Transportation Needs (08/22/2023)   PRAPARE - Administrator, Civil Service (Medical): No    Lack of Transportation (Non-Medical): No  Physical Activity: Not on file  Stress: No Stress Concern Present (09/24/2020)   Received from [facility] of Occupational Health - Occupational Stress Questionnaire    Feeling of Stress : Only a little  Social Connections: Moderately Integrated (08/22/2023)   Social Connection and Isolation Panel [NHANES]    Frequency  of Communication with Friends and Family: Twice a week    Frequency of Social Gatherings with Friends and Family: Once a week    Attends Religious Services: 1 to 4 times per year    Active Member of [employer] or Organizations: No    Attends Banker Meetings: Never    Marital Status: Married  Catering Manager Violence: Not At Risk (08/22/2023)   Humiliation, Afraid, Rape, and Kick questionnaire    Fear of Current or Ex-Partner: No     Emotionally Abused: No    Physically Abused: No    Sexually Abused: No    Review of Systems: As per HPI, all others negative  Physical Exam: Vital signs in last 24 hours: Temp:  [98.2 F (36.8 C)-98.3 F (36.8 C)] 98.3 F (36.8 C) (01/10 0520) Pulse Rate:  [78-92] 79 (01/10 1031) Resp:  [18] 18 (01/09 1524) BP: (114-145)/(47-69) 132/69 (01/10 1031) SpO2:  [92 %-100 %] 98 % (01/10 0858) Last BM Date : 08/23/23 General:   Alert,  overweight, limited mobility, pleasant and cooperative in NAD Head:  Normocephalic and atraumatic. Eyes:  Sclera clear, no icterus.   Conjunctiva pink. Ears:  Normal auditory acuity. Nose:  No deformity, discharge,  or lesions. Mouth:  No deformity or lesions.  Oropharynx pink & moist. Neck:  Supple; no masses or thyromegaly. Lungs:  No visible respiratory distress Abdomen:  Soft, nontender and nondistended. No masses, hepatosplenomegaly or hernias noted. Without guarding, and without rebound.     Msk:  Symmetrical without gross deformities. Normal posture. Pulses:  Normal pulses noted. Extremities:  Without clubbing or edema. Neurologic:  Alert and  oriented x4; limited mobility, otherwise grossly normal neurologically. Skin:  Intact without significant lesions or rashes. Psych:  Alert and cooperative. Normal mood and affect.   Lab Results: Recent Labs    08/22/23 0449 08/23/23 1134 08/24/23 0303  WBC 15.6* 9.5 7.9  HGB 8.1* 7.2* 6.8*  HCT 26.6* 23.8* 22.2*  PLT 187 182 173   BMET Recent Labs    08/22/23 0449 08/23/23 1134 08/24/23 0303  NA 138 142 140  K 3.9 3.6 3.0*  CL 111 115* 115*  CO2 14* 17* 17*  GLUCOSE 105* 114* 96  BUN 39* 25* 23*  CREATININE 2.01* 1.52* 1.40*  CALCIUM  8.6* 8.3* 8.0*   LFT Recent Labs    08/24/23 0303  PROT 5.5*  ALBUMIN 2.6*  AST 26  ALT 24  ALKPHOS 120  BILITOT 0.5   PT/INR Recent Labs    08/21/23 2330  LABPROT 15.3*  INR 1.2    Studies/Results: No results  found.  Impression:   Pneumonia. No acute distress right now, on no supplemental oxygen. Multiple sclerosis, bed/wheelchair-bound. Melena. Acute blood loss anemia. Ulcerative colitis.  Plan:   CBC, transfuse as needed. PPI. Soft diet, NPO after midnight. Endoscopy planned tomorrow. Risks (bleeding, infection, bowel perforation that could require surgery, sedation-related changes in cardiopulmonary systems), benefits (identification and possible treatment of source of symptoms, exclusion of certain causes of symptoms), and alternatives (watchful waiting, radiographic imaging studies, empiric medical treatment) of upper endoscopy (EGD) were explained to patient/family in detail and patient wishes to proceed.    LOS: 2 days   Dodie Parisi M  08/24/2023, 2:13 PM  Cell [phone] If no answer or after 5 PM call [phone]

## 2023-08-24 NOTE — Plan of Care (Signed)

## 2023-08-24 NOTE — Progress Notes (Signed)
    Patient Name: MILEENA ROTHENBERGER           DOB: May 21, 1966  MRN: [id number]      Admission Date: 08/21/2023  Attending Provider: Luz Skelton, MD  Primary Diagnosis: Hypoxia   Level of care: Telemetry    CROSS COVER NOTE   Date of Service   08/24/2023   Alwilda A Solberg, 58 y.o. female, was admitted on 08/21/2023 for Hypoxia.    HPI/Events of Note   Anemia - ikely associated with chronic medical conditions  Hemoglobin 7.2 -->  6.8.  No acute changes reported.  Hemodynamically stable. No melena, hematochezia, or other bleeding reported tonight.    Interventions/ Plan   Blood transfusion, 1 unit PRBC Recheck H&H after transfusion is completed, transfuse if HGB <7 Anemia Panel         Lavanda Horns, DNP, ACNPC- AG [facility] [place]

## 2023-08-24 NOTE — Progress Notes (Signed)
   08/24/23 0942  TOC Brief Assessment  Insurance and Status Reviewed  Patient has primary care physician Yes  Home environment has been reviewed Home w/ spouse  Prior level of function: Independent  Prior/Current Home Services No current home services  Social Drivers of Health Review SDOH reviewed no interventions necessary  Readmission risk has been reviewed Yes  Transition of care needs no transition of care needs at this time

## 2023-08-24 NOTE — Consult Note (Signed)
 [facility] Consultation Note  Referring Provider: [facility] Primary Care Physician:  [name], [name] Primary Gastroenterologist: [facility]  Reason for Consultation:  melena  HPI: Vanessa Short is a 58 y.o. female admitted respiratory difficulties felt to have viral process.  Has had few days' of melena and drop hemoglobin.  No NSAIDs.  Last egd over 10 years ago.  History ulcerative colitis.  On mesalamine .  No diarrhea of hematochezia (except when straining can have some blood on tissue paper).   Past Medical History:  Diagnosis Date   Headache    Herpes genitalis in women    History of kidney stones    Hypertension    Kidney stones    Movement disorder    Multiple sclerosis (HCC)    Neuropathy    Oral herpes    Ulcerative colitis (HCC)    Vision abnormalities     Past Surgical History:  Procedure Laterality Date   ENDOMETRIAL ABLATION  10/10/2017   KIDNEY STONE SURGERY     PORTACATH PLACEMENT N/A 10/16/2017   Procedure: ULTRA SOUND GUIDED INSERTION PORT-A-CATH ERAS PATHWAY;  Surgeon: Stevie Herlene Righter, MD;  Location: [facility];  Service: General;  Laterality: N/A;    Prior to Admission medications   Medication Sig Start Date End Date Taking? Authorizing Provider  acyclovir  (ZOVIRAX ) 400 MG tablet Take 400 mg by mouth daily. Take every day per patient 08/11/14  Yes [provider]  aspirin  EC 81 MG tablet Take 81 mg by mouth daily.   Yes [provider]  b complex vitamins tablet Take 1 tablet by mouth daily.    Yes [provider]  baclofen  (LIORESAL ) 10 MG tablet Take 1 tablet (10 mg total) by mouth 4 (four) times daily. 1 to 2 po qid up to 8/day Patient taking differently: Take 10 mg by mouth 4 (four) times daily. 04/25/23  Yes Briana Elgin LABOR, MD  cholecalciferol  (VITAMIN D ) 1000 UNITS tablet Take 1,000 Units by mouth daily.    Yes [provider]  famotidine  (PEPCID ) 40 MG tablet Take  40 mg by mouth 2 (two) times daily. 02/28/23  Yes [provider]  ferrous sulfate  325 (65 FE) MG tablet Take 325 mg by mouth daily with breakfast. 08/02/23  Yes [provider]  FLUoxetine  (PROZAC ) 20 MG capsule Take 20 mg by mouth 3 (three) times daily.   Yes [provider]  folic acid  (FOLVITE ) 400 MCG tablet Take 400 mcg by mouth daily.   Yes [provider]  furosemide  (LASIX ) 40 MG tablet Take 0.5 tablets (20 mg total) by mouth 2 (two) times daily. 04/25/23  Yes Briana Elgin LABOR, MD  labetalol  (NORMODYNE ) 100 MG tablet TAKE 1/2 TABLET BY MOUTH DAILY 01/10/23  Yes Sater, Charlie LABOR, MD  levothyroxine  (SYNTHROID ) 25 MCG tablet Take 25 mcg by mouth every evening. 08/02/23 08/01/24 Yes [provider]  lidocaine -prilocaine  (EMLA ) cream Apply one inch before port-a cath access prn Patient taking differently: Apply 1 Application topically daily as needed (port access). 03/23/21  Yes Sater, Charlie LABOR, MD  mesalamine  (APRISO ) 0.375 g 24 hr capsule Take 375 mg by mouth QID.   Yes [provider]  Multiple Vitamin (MULTIVITAMIN) tablet Take 1 tablet by mouth daily.   Yes [provider]  potassium chloride  (KLOR-CON ) 10 MEQ tablet Take 2 tablets (20 mEq total) by mouth daily. Take 4 pills daily for 3 days then continue taking as before Patient taking differently: Take 10 mEq  by mouth daily. 04/22/23  Yes Jillian Buttery, MD  rosuvastatin  (CRESTOR ) 40 MG tablet Take 40 mg by mouth at bedtime.   Yes [provider]  sodium chloride  (OCEAN) 0.65 % SOLN nasal spray Place 1 spray into both nostrils as needed for congestion. Patient taking differently: Place 1 spray into both nostrils daily as needed for congestion. 08/19/16  Yes Rai, Ripudeep K, MD  Teriflunomide  14 MG TABS Take 1 tablet (14 mg total) by mouth daily. HOLD until cleared by neurology outpatient to resume. 04/21/23  Yes Jillian Buttery, MD  tolterodine  (DETROL ) 2 MG tablet Take 2 mg  by mouth 2 (two) times daily.  05/09/18  Yes [provider]  dantrolene  (DANTRIUM ) 50 MG capsule Take 1 capsule (50 mg total) by mouth 3 (three) times daily as needed. 08/22/23   Sater, Charlie LABOR, MD  lubiprostone  (AMITIZA ) 8 MCG capsule Take 8 mcg by mouth 2 (two) times daily with a meal.    [provider]  OXcarbazepine  (TRILEPTAL ) 150 MG tablet Take 1 tablet (150 mg total) by mouth 3 (three) times daily. 08/22/23   Sater, Charlie LABOR, MD  QUEtiapine  (SEROQUEL ) 25 MG tablet One po qHS and one po every day prn Patient taking differently: Take 25 mg by mouth at bedtime. 02/12/23   Sater, Charlie LABOR, MD    Current Facility-Administered Medications  Medication Dose Route Frequency Provider Last Rate Last Admin   0.9 %  sodium chloride  infusion (Manually program via Guardrails IV Fluids)   Intravenous Once Chavez, Abigail, NP       acyclovir  (ZOVIRAX ) tablet 400 mg  400 mg Oral Daily Dorrell, Robert, MD   400 mg at 08/24/23 1030   arformoterol  (BROVANA ) nebulizer solution 15 mcg  15 mcg Nebulization BID Sira, Zackery, MD   15 mcg at 08/24/23 0853   aspirin  EC tablet 81 mg  81 mg Oral Daily Dorrell, Robert, MD   81 mg at 08/24/23 9063   baclofen  (LIORESAL ) tablet 10 mg  10 mg Oral QID Dorrell, Robert, MD   10 mg at 08/24/23 9063   budesonide  (PULMICORT ) nebulizer solution 0.5 mg  0.5 mg Nebulization BID Sira, Zackery, MD   0.5 mg at 08/24/23 0853   cefTRIAXone  (ROCEPHIN ) 2 g in sodium chloride  0.9 % 100 mL IVPB  2 g Intravenous Q24H Dena Charleston, MD 200 mL/hr at 08/24/23 0127 2 g at 08/24/23 0127   Chlorhexidine  Gluconate Cloth 2 % PADS 6 each  6 each Topical Q2000 Lue Elsie BROCKS, MD   6 each at 08/23/23 1941   enoxaparin  (LOVENOX ) injection 40 mg  40 mg Subcutaneous Q24H Dena Charleston, MD   40 mg at 08/24/23 9063   famotidine  (PEPCID ) tablet 40 mg  40 mg Oral BID Dorrell, Robert, MD   40 mg at 08/24/23 9063   fesoterodine  (TOVIAZ ) tablet 4 mg  4 mg Oral Daily Dorrell, Charleston, MD    4 mg at 08/22/23 1352   FLUoxetine  (PROZAC ) capsule 20 mg  20 mg Oral TID Dorrell, Robert, MD   20 mg at 08/24/23 0936   guaiFENesin  (ROBITUSSIN) 100 MG/5ML liquid 5 mL  5 mL Oral Q4H PRN Sira, Zackery, MD   5 mL at 08/23/23 2240   ibuprofen  (ADVIL ) tablet 400 mg  400 mg Oral Q6H PRN Dena Charleston, MD       ipratropium-albuterol  (DUONEB) 0.5-2.5 (3) MG/3ML nebulizer solution 3 mL  3 mL Nebulization Q6H Sira, Zackery, MD   3 mL at 08/24/23 [phone]  ipratropium-albuterol  (DUONEB) 0.5-2.5 (3) MG/3ML nebulizer solution 3 mL  3 mL Nebulization Q4H PRN Sira, Zackery, MD       labetalol  (NORMODYNE ) tablet 50 mg  50 mg Oral Daily Dorrell, Robert, MD   50 mg at 08/24/23 1031   levothyroxine  (SYNTHROID ) tablet 25 mcg  25 mcg Oral QPM Dena Charleston, MD   25 mcg at 08/23/23 1700   lidocaine -prilocaine  (EMLA ) cream   Topical Once Dorrell, Robert, MD       ondansetron  (ZOFRAN ) tablet 4 mg  4 mg Oral Q6H PRN Dena Charleston, MD       Or   ondansetron  (ZOFRAN ) injection 4 mg  4 mg Intravenous Q6H PRN Dorrell, Robert, MD       OXcarbazepine  (TRILEPTAL ) tablet 150 mg  150 mg Oral TID Dena Charleston, MD   150 mg at 08/24/23 1032   QUEtiapine  (SEROQUEL ) tablet 25 mg  25 mg Oral QHS Dorrell, Robert, MD   25 mg at 08/23/23 2123   rosuvastatin  (CRESTOR ) tablet 40 mg  40 mg Oral QHS Dorrell, Robert, MD   40 mg at 08/23/23 2123   sodium chloride  (OCEAN) 0.65 % nasal spray 1 spray  1 spray Each Nare PRN Dena Charleston, MD   1 spray at 08/22/23 1737   sodium chloride  flush (NS) 0.9 % injection 10-40 mL  10-40 mL Intracatheter PRN Luz Skelton, MD        Allergies as of 08/21/2023 - Review Complete 08/21/2023  Allergen Reaction Noted   Nitrofurantoin Other (See Comments) 01/21/2020   Oxycodone -acetaminophen  Other (See Comments) 09/18/2016   Risperidone and related Other (See Comments) 07/08/2018   Penicillins Rash 05/30/2013    Family History  Problem Relation Age of Onset   Dementia Mother    Hypertension  Father    Hyperlipidemia Father    Diabetes Father    Heart disease Father    Breast cancer Neg Hx     Social History   Socioeconomic History   Marital status: Married    Spouse name: Elvie Palomo   Number of children: Not on file   Years of education: Not on file   Highest education level: Not on file  Occupational History   Not on file  Tobacco Use   Smoking status: Never   Smokeless tobacco: Never  Vaping Use   Vaping status: Never Used  Substance and Sexual Activity   Alcohol use: Yes    Comment: occasional glass of wine   Drug use: No   Sexual activity: Not on file  Other Topics Concern   Not on file  Social History Narrative   ** Merged History Encounter **       Social Drivers of Health   Financial Resource Strain: Not on file  Food Insecurity: No Food Insecurity (08/22/2023)   Hunger Vital Sign    Worried About Running Out of Food in the Last Year: Never true    Ran Out of Food in the Last Year: Never true  Transportation Needs: No Transportation Needs (08/22/2023)   PRAPARE - Administrator, Civil Service (Medical): No    Lack of Transportation (Non-Medical): No  Physical Activity: Not on file  Stress: No Stress Concern Present (09/24/2020)   Received from [facility] of Occupational Health - Occupational Stress Questionnaire    Feeling of Stress : Only a little  Social Connections: Moderately Integrated (08/22/2023)   Social Connection and Isolation Panel [NHANES]    Frequency  of Communication with Friends and Family: Twice a week    Frequency of Social Gatherings with Friends and Family: Once a week    Attends Religious Services: 1 to 4 times per year    Active Member of [employer] or Organizations: No    Attends Banker Meetings: Never    Marital Status: Married  Catering Manager Violence: Not At Risk (08/22/2023)   Humiliation, Afraid, Rape, and Kick questionnaire    Fear of Current or Ex-Partner: No     Emotionally Abused: No    Physically Abused: No    Sexually Abused: No    Review of Systems: As per HPI, all others negative  Physical Exam: Vital signs in last 24 hours: Temp:  [98.2 F (36.8 C)-98.3 F (36.8 C)] 98.3 F (36.8 C) (01/10 0520) Pulse Rate:  [78-92] 79 (01/10 1031) Resp:  [18] 18 (01/09 1524) BP: (114-145)/(47-69) 132/69 (01/10 1031) SpO2:  [92 %-100 %] 98 % (01/10 0858) Last BM Date : 08/23/23 General:   Alert,  overweight, limited mobility, pleasant and cooperative in NAD Head:  Normocephalic and atraumatic. Eyes:  Sclera clear, no icterus.   Conjunctiva pink. Ears:  Normal auditory acuity. Nose:  No deformity, discharge,  or lesions. Mouth:  No deformity or lesions.  Oropharynx pink & moist. Neck:  Supple; no masses or thyromegaly. Lungs:  No visible respiratory distress Abdomen:  Soft, nontender and nondistended. No masses, hepatosplenomegaly or hernias noted. Without guarding, and without rebound.     Msk:  Symmetrical without gross deformities. Normal posture. Pulses:  Normal pulses noted. Extremities:  Without clubbing or edema. Neurologic:  Alert and  oriented x4; limited mobility, otherwise grossly normal neurologically. Skin:  Intact without significant lesions or rashes. Psych:  Alert and cooperative. Normal mood and affect.   Lab Results: Recent Labs    08/22/23 0449 08/23/23 1134 08/24/23 0303  WBC 15.6* 9.5 7.9  HGB 8.1* 7.2* 6.8*  HCT 26.6* 23.8* 22.2*  PLT 187 182 173   BMET Recent Labs    08/22/23 0449 08/23/23 1134 08/24/23 0303  NA 138 142 140  K 3.9 3.6 3.0*  CL 111 115* 115*  CO2 14* 17* 17*  GLUCOSE 105* 114* 96  BUN 39* 25* 23*  CREATININE 2.01* 1.52* 1.40*  CALCIUM  8.6* 8.3* 8.0*   LFT Recent Labs    08/24/23 0303  PROT 5.5*  ALBUMIN 2.6*  AST 26  ALT 24  ALKPHOS 120  BILITOT 0.5   PT/INR Recent Labs    08/21/23 2330  LABPROT 15.3*  INR 1.2    Studies/Results: No results  found.  Impression:   Pneumonia. No acute distress right now, on no supplemental oxygen. Multiple sclerosis, bed/wheelchair-bound. Melena. Acute blood loss anemia. Ulcerative colitis.  Plan:   CBC, transfuse as needed. PPI. Soft diet, NPO after midnight. Endoscopy planned tomorrow. Risks (bleeding, infection, bowel perforation that could require surgery, sedation-related changes in cardiopulmonary systems), benefits (identification and possible treatment of source of symptoms, exclusion of certain causes of symptoms), and alternatives (watchful waiting, radiographic imaging studies, empiric medical treatment) of upper endoscopy (EGD) were explained to patient/family in detail and patient wishes to proceed.    LOS: 2 days   Dodie Parisi M  08/24/2023, 2:13 PM  Cell [phone] If no answer or after 5 PM call [phone]

## 2023-08-25 ENCOUNTER — Encounter (HOSPITAL_COMMUNITY)
Admission: EM | Disposition: A | Discharge status: Home / Self Care | Payer: Self-pay | Source: Home / Self Care | Attending: Family Medicine

## 2023-08-25 ENCOUNTER — Inpatient Hospital Stay (HOSPITAL_COMMUNITY): Payer: Medicare Other

## 2023-08-25 ENCOUNTER — Encounter (HOSPITAL_COMMUNITY): Payer: Self-pay | Admitting: Internal Medicine

## 2023-08-25 ENCOUNTER — Inpatient Hospital Stay (HOSPITAL_COMMUNITY): Payer: Self-pay

## 2023-08-25 DIAGNOSIS — I5032 Chronic diastolic (congestive) heart failure: Secondary | ICD-10-CM

## 2023-08-25 DIAGNOSIS — K229 Disease of esophagus, unspecified: Secondary | ICD-10-CM | POA: Diagnosis not present

## 2023-08-25 DIAGNOSIS — K3189 Other diseases of stomach and duodenum: Secondary | ICD-10-CM

## 2023-08-25 DIAGNOSIS — I110 Hypertensive heart disease with heart failure: Secondary | ICD-10-CM

## 2023-08-25 DIAGNOSIS — R051 Acute cough: Secondary | ICD-10-CM

## 2023-08-25 DIAGNOSIS — N3000 Acute cystitis without hematuria: Principal | ICD-10-CM

## 2023-08-25 HISTORY — PX: ESOPHAGOGASTRODUODENOSCOPY (EGD) WITH PROPOFOL: SHX5813

## 2023-08-25 HISTORY — PX: BIOPSY: SHX5522

## 2023-08-25 LAB — BASIC METABOLIC PANEL
Anion gap: 11 (ref 5–15)
BUN: 19 mg/dL (ref 6–20)
CO2: 16 mmol/L — ABNORMAL LOW (ref 22–32)
Calcium: 8 mg/dL — ABNORMAL LOW (ref 8.9–10.3)
Chloride: 118 mmol/L — ABNORMAL HIGH (ref 98–111)
Creatinine, Ser: 1.47 mg/dL — ABNORMAL HIGH (ref 0.44–1.00)
GFR, Estimated: 41 mL/min — ABNORMAL LOW (ref >60)
Glucose, Bld: 121 mg/dL — ABNORMAL HIGH (ref 70–99)
Potassium: 3.6 mmol/L (ref 3.5–5.1)
Sodium: 145 mmol/L (ref 135–145)

## 2023-08-25 LAB — CBC
HCT: 27 % — ABNORMAL LOW (ref 36.0–46.0)
Hemoglobin: 8.6 g/dL — ABNORMAL LOW (ref 12.0–15.0)
MCH: 32 pg (ref 26.0–34.0)
MCHC: 31.9 g/dL (ref 30.0–36.0)
MCV: 100.4 fL — ABNORMAL HIGH (ref 80.0–100.0)
Platelets: 176 10*3/uL (ref 150–400)
RBC: 2.69 MIL/uL — ABNORMAL LOW (ref 3.87–5.11)
RDW: 16.1 % — ABNORMAL HIGH (ref 11.5–15.5)
WBC: 8.7 10*3/uL (ref 4.0–10.5)
nRBC: 0 % (ref 0.0–0.2)

## 2023-08-25 SURGERY — ESOPHAGOGASTRODUODENOSCOPY (EGD) WITH PROPOFOL
Anesthesia: Monitor Anesthesia Care | Laterality: Left

## 2023-08-25 MED ORDER — FENTANYL CITRATE (PF) 100 MCG/2ML IJ SOLN: 25.0000 ug | INTRAMUSCULAR | Status: DC | PRN

## 2023-08-25 MED ORDER — PANTOPRAZOLE SODIUM 40 MG PO TBEC: 40.0000 mg | DELAYED_RELEASE_TABLET | Freq: Every day | ORAL | 0 refills | Status: DC

## 2023-08-25 MED ORDER — PROPOFOL 500 MG/50ML IV EMUL
INTRAVENOUS | Status: AC
  Filled 2023-08-25: qty 50

## 2023-08-25 MED ORDER — SODIUM CHLORIDE 0.9 % IV SOLN: INTRAVENOUS | Status: DC | PRN

## 2023-08-25 MED ORDER — CEFADROXIL 500 MG PO CAPS
500.0000 mg | ORAL_CAPSULE | Freq: Two times a day (BID) | ORAL | 0 refills | Status: DC
Start: 2023-08-25 — End: 2024-04-14

## 2023-08-25 MED ORDER — IPRATROPIUM-ALBUTEROL 0.5-2.5 (3) MG/3ML IN SOLN: 3.0000 mL | Freq: Two times a day (BID) | RESPIRATORY_TRACT | Status: DC

## 2023-08-25 MED ORDER — PROPOFOL 500 MG/50ML IV EMUL
INTRAVENOUS | Status: DC | PRN
  Administered 2023-08-25: 125 ug/kg/min via INTRAVENOUS

## 2023-08-25 MED ORDER — LIDOCAINE HCL (CARDIAC) PF 100 MG/5ML IV SOSY
PREFILLED_SYRINGE | INTRAVENOUS | Status: DC | PRN
  Administered 2023-08-25: 100 mg via INTRAVENOUS

## 2023-08-25 MED ORDER — PROPOFOL 10 MG/ML IV BOLUS
INTRAVENOUS | Status: DC | PRN
  Administered 2023-08-25: 40 mg via INTRAVENOUS
  Administered 2023-08-25: 80 mg via INTRAVENOUS

## 2023-08-25 MED ORDER — HEPARIN SOD (PORK) LOCK FLUSH 100 UNIT/ML IV SOLN
500.0000 [IU] | INTRAVENOUS | Status: AC | PRN
  Administered 2023-08-25: 500 [IU]

## 2023-08-25 MED ORDER — GUAIFENESIN 100 MG/5ML PO LIQD: 5.0000 mL | ORAL | 0 refills | Status: DC | PRN

## 2023-08-25 MED ORDER — PANTOPRAZOLE SODIUM 40 MG PO TBEC
40.0000 mg | DELAYED_RELEASE_TABLET | Freq: Every day | ORAL | Status: DC
  Administered 2023-08-25: 40 mg via ORAL
  Filled 2023-08-25: qty 1

## 2023-08-25 SURGICAL SUPPLY — 14 items

## 2023-08-25 NOTE — Discharge Summary (Signed)
 Physician Discharge Summary   [name] MRN: [id number] DOB: 12/20/1965  Admit date:     08/21/2023  Discharge date: 08/25/23  Discharge Physician: Reyes VEAR Gaw   PCP: [name], [name]   Recommendations at discharge:   Follow-up with EGD biopsy results for both H. pylori and possible candidiasis. Starting Protonix  at 40 mg daily for the next month.  Consider cessation versus ongoing need for this. Patient had cough for the past 2 weeks prior to coming in and reflux might be because of her cough as this had also fairly acutely worsened. Aspirin  was held for 2 weeks after discharge.  Discuss ongoing need for this. Patient was also diagnosed with UTI.  She received 4 days worth of ceftriaxone .  Discharged with 1 extra day of cefadroxil  to fully treat UTI.  Discharge Diagnoses: Principal Problem:   Hypoxia Active Problems:   Melena   Blood loss anemia   Brief hospital course: Vanessa Short is a 58 y.o. female with medical history significant of multiple sclerosis, ulcerative colitis on mesalamine , CKD stage IIIa, hypertension, hyperlipidemia who presents emergency department due to cough fever congestion.  Patient was seen at outside facility due to dysuria and was given fosfomycin.  Patient states that despite taking this medication she has had persistent dysuria and fevers at home. FOund to have declining Hgb while in-house as well as several episodes of melena.  GI consulted and found erosive esophagitis with concerns for possible candidiasis versus H. pylori.  Biopsies were taken.  Patient had persistent cough although her lungs were normal and this did not really improve with nebulized bronchodilators.  Possible this was secondary to worsening reflux.  Patient discharged home after EGD with 1 month Protonix  and 2 weeks of held aspirin .   Assessment and Plan: UTI (failed outpt antibx)  - IV ceftriaxone  2 g daily  -Improving from the standpoint - 1 more day  of cefadroxil  to fully treat x 5 days    2.  Melena, concern for lower GI bleed - Hgb also dropping from 8-->7-->6.8 -EGD results as above. -On day of discharge her hemoglobin rose to 8.6 more than the expected amount from administration of just 1 unit packed red blood cells. -No further melena.  Able to discharge home from the standpoint as well   3.  Congestion: -Initially there was some concern for pneumonia but her cough is resolving. -Chest x-ray negative. -Nasal saline for sinus congestion.   #CKD3a - Kidney function improved (2.06 --> 1.52-->1.4)   #SOB - Pt on room air but is congested (nasal swab was negative x 4)  - Pulmicort /brovana  bid  - Duoneb q6 hr and q4 hr PRN  -Cough possibly secondary to reflux   #. HTN  - Labetalol  50 mg PO daily    #. HLD - Crestor  40 mg PO at bedtime         Consultants: [facility] Procedures performed: Upper endoscopy Disposition: Home Diet recommendation:  Discharge Diet Orders (From admission, onward)     Start     Ordered   08/25/23 0000  Diet general        08/25/23 1633           Regular diet DISCHARGE MEDICATION: Allergies as of 08/25/2023       Reactions   Nitrofurantoin Other (See Comments)   syncope   Oxycodone -acetaminophen  Other (See Comments)   Risperidone And Related Other (See Comments)   Became very lethargic   Penicillins Rash  Did it involve swelling of the face/tongue/throat, SOB, or low BP? No Did it involve sudden or severe rash/hives, skin peeling, or any reaction on the inside of your mouth or nose? Yes Did you need to seek medical attention at a hospital or doctor's office? No When did it last happen? Over 30 Years      If all above answers are NO, may proceed with cephalosporin use.           Discharge Exam: Filed Weights   08/25/23 1310  Weight: 81.6 kg   General:  Pleasantly resting in bed, No acute distress. HEENT:  Normocephalic atraumatic.  Sclerae nonicteric,  noninjected.  Extraocular movements intact bilaterally. Neck:  Without mass or deformity.  Trachea is midline. Lungs:  Clear to auscultate bilaterally without rhonchi, wheeze, or rales. Heart:  Regular rate and rhythm.  Without murmurs, rubs, or gallops. Abdomen:  Soft, nontender, nondistended.  Without guarding or rebound. Extremities: Without cyanosis, clubbing, edema, or obvious deformity. Skin:  Warm and dry, no erythema.  Condition at discharge: good  The results of significant diagnostics from this hospitalization (including imaging, microbiology, ancillary and laboratory) are listed below for reference.   Imaging Studies: DG Chest 2 View Result Date: 08/21/2023 CLINICAL DATA:  Cough. EXAM: CHEST - 2 VIEW COMPARISON:  04/21/2023. FINDINGS: Redemonstration of atelectatic changes at the left lung base. There is elevated left hemidiaphragm. Bilateral lung fields are otherwise clear. Bilateral costophrenic angles are clear. Stable cardio-mediastinal silhouette. No acute osseous abnormalities. The soft tissues are within normal limits. Right-sided CT Port-A-Cath is seen with its tip overlying the midportion of superior vena cava. IMPRESSION: No active cardiopulmonary disease. Electronically Signed   By: Ree Molt M.D.   On: 08/21/2023 13:05    Microbiology: Results for orders placed or performed during the hospital encounter of 08/21/23  Resp panel by RT-PCR (RSV, Flu A&B, Covid) Anterior Nasal Swab     Status: None   Collection Time: 08/21/23 12:37 PM   Specimen: Anterior Nasal Swab  Result Value Ref Range Status   SARS Coronavirus 2 by RT PCR NEGATIVE NEGATIVE Final    Comment: (NOTE) SARS-CoV-2 target nucleic acids are NOT DETECTED.  The SARS-CoV-2 RNA is generally detectable in upper respiratory specimens during the acute phase of infection. The lowest concentration of SARS-CoV-2 viral copies this assay can detect is 138 copies/mL. A negative result does not preclude  SARS-Cov-2 infection and should not be used as the sole basis for treatment or other patient management decisions. A negative result may occur with  improper specimen collection/handling, submission of specimen other than nasopharyngeal swab, presence of viral mutation(s) within the areas targeted by this assay, and inadequate number of viral copies(<138 copies/mL). A negative result must be combined with clinical observations, patient history, and epidemiological information. The expected result is Negative.  Fact Sheet for Patients:  bloggercourse.com  Fact Sheet for Healthcare Providers:  seriousbroker.it  This test is no t yet approved or cleared by the [place]  FDA and  has been authorized for detection and/or diagnosis of SARS-CoV-2 by FDA under an Emergency Use Authorization (EUA). This EUA will remain  in effect (meaning this test can be used) for the duration of the COVID-19 declaration under Section 564(b)(1) of the Act, 21 U.S.C.section 360bbb-3(b)(1), unless the authorization is terminated  or revoked sooner.       Influenza A by PCR NEGATIVE NEGATIVE Final   Influenza B by PCR NEGATIVE NEGATIVE Final    Comment: (NOTE) The Xpert  Xpress SARS-CoV-2/FLU/RSV plus assay is intended as an aid in the diagnosis of influenza from Nasopharyngeal swab specimens and should not be used as a sole basis for treatment. Nasal washings and aspirates are unacceptable for Xpert Xpress SARS-CoV-2/FLU/RSV testing.  Fact Sheet for Patients: bloggercourse.com  Fact Sheet for Healthcare Providers: seriousbroker.it  This test is not yet approved or cleared by the [place]  FDA and has been authorized for detection and/or diagnosis of SARS-CoV-2 by FDA under an Emergency Use Authorization (EUA). This EUA will remain in effect (meaning this test can be used) for the duration of  the COVID-19 declaration under Section 564(b)(1) of the Act, 21 U.S.C. section 360bbb-3(b)(1), unless the authorization is terminated or revoked.     Resp Syncytial Virus by PCR NEGATIVE NEGATIVE Final    Comment: (NOTE) Fact Sheet for Patients: bloggercourse.com  Fact Sheet for Healthcare Providers: seriousbroker.it  This test is not yet approved or cleared by the [place]  FDA and has been authorized for detection and/or diagnosis of SARS-CoV-2 by FDA under an Emergency Use Authorization (EUA). This EUA will remain in effect (meaning this test can be used) for the duration of the COVID-19 declaration under Section 564(b)(1) of the Act, 21 U.S.C. section 360bbb-3(b)(1), unless the authorization is terminated or revoked.  Performed at [facility], [address], [place], [place] [address]   Blood Culture (routine x 2)     Status: None (Preliminary result)   Collection Time: 08/21/23 11:27 PM   Specimen: BLOOD RIGHT ARM  Result Value Ref Range Status   Specimen Description   Final    BLOOD RIGHT ARM Performed at [facility], [address] [place], [place] [address]    Special Requests   Final    BOTTLES DRAWN AEROBIC AND ANAEROBIC Blood Culture results may not be optimal due to an inadequate volume of blood received in culture bottles Performed at [facility], [address] [place], [place] [address]    Culture   Final    NO GROWTH 3 DAYS Performed at [facility], [address] [place], [place] [address]    Report Status PENDING  Incomplete  Blood Culture (routine x 2)     Status: None (Preliminary result)   Collection Time: 08/21/23 11:28 PM   Specimen: BLOOD LEFT HAND  Result Value Ref Range Status   Specimen Description   Final    BLOOD LEFT HAND Performed at [facility], [address], [place], [place] [address]     Special Requests   Final    BOTTLES DRAWN AEROBIC AND ANAEROBIC Blood Culture results may not be optimal due to an inadequate volume of blood received in culture bottles Performed at [facility], [address], [place], [place] [address]    Culture   Final    NO GROWTH 3 DAYS Performed at [facility], [address], [place], [place] [address]    Report Status PENDING  Incomplete  MRSA Next Gen by PCR, Nasal     Status: None   Collection Time: 08/23/23  4:44 PM   Specimen: Nasal Mucosa; Nasal Swab  Result Value Ref Range Status   MRSA by PCR Next Gen NOT DETECTED NOT DETECTED Final    Comment: (NOTE) The GeneXpert MRSA Assay (FDA approved for NASAL specimens only), is one component of a comprehensive MRSA colonization surveillance program. It is not intended to diagnose MRSA infection nor to guide or  monitor treatment for MRSA infections. Test performance is not FDA approved in patients less than 53 years old. Performed at [facility], [address] [place], [place] [address]     Labs: CBC: Recent Labs  Lab 08/21/23 1237 08/22/23 0449 08/23/23 1134 08/24/23 0303 08/25/23 0026  WBC 14.2* 15.6* 9.5 7.9 8.7  NEUTROABS 11.2*  --   --   --   --   HGB 9.4* 8.1* 7.2* 6.8* 8.6*  HCT 30.5* 26.6* 23.8* 22.2* 27.0*  MCV 101.0* 101.9* 101.7* 100.9* 100.4*  PLT 182 187 182 173 176   Basic Metabolic Panel: Recent Labs  Lab 08/21/23 1237 08/22/23 0449 08/23/23 1134 08/24/23 0303 08/25/23 0026  NA 138 138 142 140 145  K 4.3 3.9 3.6 3.0* 3.6  CL 111 111 115* 115* 118*  CO2 14* 14* 17* 17* 16*  GLUCOSE 123* 105* 114* 96 121*  BUN 44* 39* 25* 23* 19  CREATININE 2.06* 2.01* 1.52* 1.40* 1.47*  CALCIUM  9.2 8.6* 8.3* 8.0* 8.0*   Liver Function Tests: Recent Labs  Lab 08/21/23 1237 08/23/23 1134 08/24/23 0303  AST 18 18 26   ALT 27 20 24   ALKPHOS 153* 120 120  BILITOT 0.8 0.3 0.5  PROT 7.5 5.7* 5.5*  ALBUMIN 3.6 2.4* 2.6*    CBG: No results for input(s): GLUCAP in the last 168 hours.  Discharge time spent: less than 30 minutes.  Signed: Reyes VEAR Gaw, MD [facility] 08/25/2023

## 2023-08-25 NOTE — Plan of Care (Signed)

## 2023-08-25 NOTE — Transfer of Care (Addendum)
 Immediate Anesthesia Transfer of Care Note  [name]  Procedure(s) Performed: ESOPHAGOGASTRODUODENOSCOPY (EGD) WITH PROPOFOL  (Left) BIOPSY  Patient Location: Endoscopy Unit  Anesthesia Type:MAC  Level of Consciousness: drowsy and patient cooperative  Airway & Oxygen Therapy: Patient Spontanous Breathing and Patient connected to face mask oxygen  Post-op Assessment: Report given to RN and Post -op Vital signs reviewed and stable  Post vital signs: Reviewed and stable  Last Vitals:  Vitals Value Taken Time  BP 129/67 08/25/23 1350  Temp 36.9 08/25/23   1350  Pulse 88 08/25/23 1350  Resp 21 08/25/23 1350  SpO2 95 % 08/25/23 1350  Vitals shown include unfiled device data.  Last Pain:  Vitals:   08/25/23 1310  TempSrc: Temporal  PainSc: 0-No pain         Complications: No notable events documented.

## 2023-08-25 NOTE — Op Note (Signed)
 [facility] Patient Name: Vanessa Short Procedure Date: 08/25/2023 MRN: [id number] Attending MD: Estelita Manas , MD, [id number] Date of Birth: January 20, 1966 CSN: [id number] Age: 58 Admit Type: Inpatient Procedure:                Upper GI endoscopy Indications:              Melena, anemia Providers:                Estelita Manas, MD, Collene Edu, RN, Curtistine Bishop,                            Technician Referring MD:             [facility] Medicines:                Monitored Anesthesia Care Complications:            No immediate complications. Estimated Blood Loss:     Estimated blood loss was minimal. Procedure:                Pre-Anesthesia Assessment:                           - Prior to the procedure, a History and Physical                            was performed, and patient medications and                            allergies were reviewed. The patient's tolerance of                            previous anesthesia was also reviewed. The risks                            and benefits of the procedure and the sedation                            options and risks were discussed with the patient.                            All questions were answered, and informed consent                            was obtained. Prior Anticoagulants: The patient has                            taken no anticoagulant or antiplatelet agents                            except for aspirin . ASA Grade Assessment: III - A                            patient with severe systemic disease. After  reviewing the risks and benefits, the patient was                            deemed in satisfactory condition to undergo the                            procedure.                           After obtaining informed consent, the endoscope was                            passed under direct vision. Throughout the                            procedure, the patient's blood pressure,  pulse, and                            oxygen saturations were monitored continuously. The                            [id number]-H190 ([id number]) Olympus endoscope was introduced                            through the mouth, and advanced to the second part                            of duodenum. The upper GI endoscopy was                            accomplished without difficulty. The patient                            tolerated the procedure well. Scope In: Scope Out: Findings:      Patchy, white plaques were found in the middle third of the esophagus       and in the lower third of the esophagus. Biopsies were taken with a cold       forceps for histology.      The Z-line was regular and was found 35 cm from the incisors.      Multiple dispersed small erosions with no bleeding and no stigmata of       recent bleeding were found in the gastric fundus, in the gastric body       and in the gastric antrum. Biopsies were taken with a cold forceps for       Helicobacter pylori testing.      The cardia and gastric fundus were normal on retroflexion.      The examined duodenum was normal. Impression:               - Esophageal plaques were found, suspicious for                            candidiasis. Biopsied.                           -  Z-line regular, 35 cm from the incisors.                           - Erosive gastropathy with no bleeding and no                            stigmata of recent bleeding. Biopsied.                           - Normal examined duodenum. Moderate Sedation:      Patient did not receive moderate sedation for this procedure, but       instead received monitored anesthesia care. Recommendation:           - Resume regular diet.                           - Continue present medications. PPI once a day for                            at least 1 month, avoid ASA for 2 weeks.                           - Await pathology results. Procedure Code(s):        --- Professional ---                            [phone], Esophagogastroduodenoscopy, flexible,                            transoral; with biopsy, single or multiple Diagnosis Code(s):        --- Professional ---                           K22.9, Disease of esophagus, unspecified                           K31.89, Other diseases of stomach and duodenum                           K92.1, Melena (includes Hematochezia) CPT copyright 2022 [employer]. All rights reserved. The codes documented in this report are preliminary and upon coder review may  be revised to meet current compliance requirements. Estelita Manas, MD 08/25/2023 1:48:15 PM This report has been signed electronically. Number of Addenda: 0

## 2023-08-25 NOTE — Plan of Care (Signed)

## 2023-08-25 NOTE — Anesthesia Postprocedure Evaluation (Signed)
 Anesthesia Post Note  Patient: Patriece A Dancy  Procedure(s) Performed: ESOPHAGOGASTRODUODENOSCOPY (EGD) WITH PROPOFOL  (Left) BIOPSY     Patient location during evaluation: PACU Anesthesia Type: MAC Level of consciousness: awake and alert Pain management: pain level controlled Vital Signs Assessment: post-procedure vital signs reviewed and stable Respiratory status: spontaneous breathing, nonlabored ventilation, respiratory function stable and patient connected to nasal cannula oxygen Cardiovascular status: stable and blood pressure returned to baseline Postop Assessment: no apparent nausea or vomiting Anesthetic complications: no   No notable events documented.  Last Vitals:  Vitals:   08/25/23 1400 08/25/23 1410  BP: (!) 121/44 (!) 148/59  Pulse: 77 81  Resp: 17 18  Temp:    SpO2: 97% 99%    Last Pain:  Vitals:   08/25/23 1410  TempSrc:   PainSc: 0-No pain                 Thom JONELLE Peoples

## 2023-08-25 NOTE — Anesthesia Preprocedure Evaluation (Addendum)
 Anesthesia Evaluation  Patient identified by MRN, date of birth, ID band Patient awake    Reviewed: Allergy & Precautions, H&P , NPO status , Patient's Chart, lab work & pertinent test results  Airway Mallampati: II  TM Distance: >3 FB Neck ROM: Full    Dental no notable dental hx.    Pulmonary sleep apnea    Pulmonary exam normal breath sounds clear to auscultation       Cardiovascular hypertension, +CHF  Normal cardiovascular exam Rhythm:Regular Rate:Normal     Neuro/Psych  Headaches PSYCHIATRIC DISORDERS Anxiety Depression    MS Spastic quadriparesis    GI/Hepatic Neg liver ROS, PUD,,,UC melena   Endo/Other  negative endocrine ROS    Renal/GU Renal disease  negative genitourinary   Musculoskeletal negative musculoskeletal ROS (+)    Abdominal   Peds negative pediatric ROS (+)  Hematology  (+) Blood dyscrasia, anemia   Anesthesia Other Findings   Reproductive/Obstetrics negative OB ROS                             Anesthesia Physical Anesthesia Plan  ASA: 3  Anesthesia Plan: MAC   Post-op Pain Management:    Induction: Intravenous  PONV Risk Score and Plan: Propofol  infusion and Treatment may vary due to age or medical condition  Airway Management Planned: Natural Airway  Additional Equipment:   Intra-op Plan:   Post-operative Plan:   Informed Consent: I have reviewed the patients History and Physical, chart, labs and discussed the procedure including the risks, benefits and alternatives for the proposed anesthesia with the patient or authorized representative who has indicated his/her understanding and acceptance.     Dental advisory given  Plan Discussed with: CRNA  Anesthesia Plan Comments:         Anesthesia Quick Evaluation

## 2023-08-25 NOTE — Progress Notes (Signed)
 Mobility Specialist - Progress Note   08/25/23 1732  Mobility  Activity Transferred from bed to chair  Level of Assistance Total care  Assistive Device MaxiMove  Activity Response Tolerated well  Mobility visit 1 Mobility  Mobility Specialist Start Time (ACUTE ONLY) 1700  Mobility Specialist Stop Time (ACUTE ONLY) 1731  Mobility Specialist Time Calculation (min) (ACUTE ONLY) 31 min   Assisted RN in transfer of pt to personal chair. Pt was left on chair with family in room.   Erminio Leos Mobility Specialist

## 2023-08-25 NOTE — Interval H&P Note (Signed)
 History and Physical Interval Note: 58 year old female with history of ulcerative colitis with melena and drop in hemoglobin for an EGD with propofol . 08/25/2023 1:14 PM  Vanessa Short  has presented today for EGD with propofol  with the diagnosis of melena, anemia.  The various methods of treatment have been discussed with the patient and family. After consideration of risks, benefits and other options for treatment, the patient has consented to  Procedure(s): ESOPHAGOGASTRODUODENOSCOPY (EGD) WITH PROPOFOL  (Left) as a surgical intervention.  The patient's history has been reviewed, patient examined, no change in status, stable for surgery.  I have reviewed the patient's chart and labs.  Questions were answered to the patient's satisfaction.     Estelita Manas

## 2023-08-26 ENCOUNTER — Encounter (HOSPITAL_COMMUNITY): Payer: Self-pay | Admitting: Gastroenterology

## 2023-08-27 LAB — CULTURE, BLOOD (ROUTINE X 2)
Culture: NO GROWTH
Culture: NO GROWTH

## 2023-08-27 LAB — TYPE AND SCREEN
ABO/RH(D): A POS
Antibody Screen: NEGATIVE
Unit division: 0

## 2023-08-27 LAB — BPAM RBC
Blood Product Expiration Date: 202502072359
ISSUE DATE / TIME: 202501101438
Unit Type and Rh: 6200

## 2023-08-28 ENCOUNTER — Encounter: Payer: Self-pay | Admitting: Neurology

## 2023-08-28 LAB — SURGICAL PATHOLOGY

## 2023-09-19 ENCOUNTER — Encounter: Payer: Self-pay | Admitting: Neurology

## 2023-09-19 ENCOUNTER — Ambulatory Visit: Payer: Medicare Other | Admitting: Neurology

## 2023-09-19 VITALS — BP 130/84 | HR 74

## 2023-09-19 DIAGNOSIS — G35D Multiple sclerosis, unspecified: Secondary | ICD-10-CM

## 2023-09-19 DIAGNOSIS — Z79899 Other long term (current) drug therapy: Secondary | ICD-10-CM

## 2023-09-19 DIAGNOSIS — G4731 Primary central sleep apnea: Secondary | ICD-10-CM

## 2023-09-19 DIAGNOSIS — G4719 Other hypersomnia: Secondary | ICD-10-CM

## 2023-09-19 DIAGNOSIS — Z95828 Presence of other vascular implants and grafts: Secondary | ICD-10-CM

## 2023-09-19 DIAGNOSIS — G35 Multiple sclerosis: Secondary | ICD-10-CM

## 2023-09-19 DIAGNOSIS — G800 Spastic quadriplegic cerebral palsy: Secondary | ICD-10-CM

## 2023-09-19 DIAGNOSIS — G8250 Quadriplegia, unspecified: Secondary | ICD-10-CM

## 2023-09-19 DIAGNOSIS — G4733 Obstructive sleep apnea (adult) (pediatric): Secondary | ICD-10-CM

## 2023-09-19 NOTE — Progress Notes (Signed)
 [facility]  [name] DOB: [birth year]  REFERRING CLINICIAN: Doreatha Short, [facility] HISTORY FROM: patient    REASON FOR VISIT: MS   HISTORICAL  CHIEF COMPLAINT:  Chief Complaint  Patient presents with   Room 10    Pt is here her Husband. Pt's husband states that pt had another UTI and had to go to the the hospital 08/21/2023. Pt states pt had blood in her stool and is getting a colonoscopy. Pt has been struggling with her BiPAP machine.     HISTORY OF PRESENT ILLNESS:  Vanessa Short is a 58 y.o. woman diagnosed with relapsing remitting MS in 1989.   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  We recently started teriflunomide .  I would be reluctant to use an anti-CD20 agent as she has admissions every year for infection.  She had a home sleep study in July 2024 showing severe sleep apnea (AHI of 86/h) and much of this was central apnea with Cheyne-Stokes respirations.  She had a BiPAP titration and pressors were increased to BiPAP 15/11 with a backup rate of 10.  She did well on 14/10 and 15/11 backup rate 10.  She is trying to use BiPAP but has had difficulties with leakage.  I asked our sleep lab manager, Vanessa Short, to help fit her for a different mask.  She was fitted with the Airfit F40 standard headgear, medium cushion.  She will try this to see if she gets a better seal and better control of her apnea.  She had another infection and was hospitalized last month (severe sinus).  She was noted to have anemia and also needed to have EGD and will have an outpatient colonoscopy.  She remains bed and chair bound since she broke her right leg and left shoulder.  She is no longer able to assist with transfers.  She has a multi functional power wheelchair. Currently, strength is 0-1/5 in the  legs and 2/5 in the left arm and 2+ to 4 -/5 in the right arm. She has urinary incontinence but has some voluntary control at times.   Vision is symmetric.Vanessa Short  Spasticity has been a problem and she is on Dantrium  50 mg 3 times a day and baclofen  10 mg 4 times a day.   She notes fatigue.  She denies depression at this time.  Cognition is usually fine but has had issues when she has an infection.    She takes fluoxetine  60 mg daily.    she had had episodes of decreased responsiveness but an EEG showed normal activity.  She also has had hallucinations in the past, especially when she had infections.  She has a lot of spasticity in the legs.  Baclofen  has helped her some.  The dose was reduced recently due to her elevated creatinine..  She has had long QT on some EKG  She snores and has witnessed OSA.    She is sleepy.   HST sin July showed severe sleep apnea with much of the night and Cheyne-Stokes respiration. Due to the combination of obstructive and central sleep apnea and neurologic dysfunction, there is a high likelihood that she will need advanced BiPAP mode such as ST or ASV .   Bipap titration eeded to be rescheduled as she hasd COvid-19.     BiPAP titration 06/19/2023:  The patient was initiated on CPAP +5 cm and inreased to 7 cm.  Due to continued central and obstructive events, BiPAP was initated and titrated to 13/9 cm H2O pressure.   Due to continued central events with periodic breathing noted, the patient was placed on BiPAP ST and titrated to +15/11 cm H2O pressure with BUR 12.   On this setting, the  AHI was 0.   EPWORTH SLEEPINESS SCALE  On a scale of 0 - 3 what is the chance of dozing:  Sitting and Reading:   3 Watching TV:    3 Sitting inactive in a public place: 1 Passenger in car for one hour: 3 Lying down to rest in the afternoon: 3 Sitting and talking to someone: 0 Sitting quietly after lunch:  2 In a car, stopped in traffic:  N/a  Total (out of 24):   15    MS: She was  diagnosed with MS 1989.  Unfortunately, she has had progressive difficulties with weakness in both legs and in the left arm more than the right arm..  She has not had any recent exacerbation.  Due to her low lymphocyte count her Vumerity is currently being held.    MS History:  She presented with a Lhermite's syndrome in 1988 or 1989.   MRIs of the head and neck were performed. They showed lesions consistent with the diagnosis of multiple sclerosis. Lumbar puncture was not necessary. Over the next 15 years, she would get some exacerbations and have courses of steroids. However, a disease modifying therapy was not prescribed. By 2004, she had difficulty with her gait and would often have to lean on somebody for support.  She started Copaxone that year and stayed on it for about one half years. She stopped due to a lot of itching. She then switched to Rebif. While on Copaxone Rebif she continues to have exacerbations and further difficulties with her gait. About 2012 she switched to Aubagio . She tolerated Aubagio  well but progressed.  Tysabri was started in February 2019 after relapse.  Unfortunately she converted from JCV negative to positive.  She felt she did well on Tysabri with some slowing of her progression as well as improved fatigue.  Due to the elevated JCV, she switched to Vumerity in 2021.  MRI 10/11/2016 showed:  Confluent white matter disease throughout the cerebral hemispheres consistent with chronic multiple sclerosis. Several foci show restricted diffusion, but no contrast enhancement.  No new lesions compared to January 2018.  MRI cervical spine 08/16/2016 showed:  C5 myelomalacia, additional spinal cord plaques consistent with chronic demyelination, relatively similar though limited assessment due to patient motion.  MRI brain 08/16/2016 showed:  Numerous foci of reduced diffusion most consistent with hyperacute/active demyelination, less likely infection/septic emboli. Severe chronic  demyelination including supra and infratentorial white matter, cortex and deep gray nuclei lesions.  Moderate parenchymal brain volume loss for age.  REVIEW OF SYSTEMS:  Constitutional: No fevers, chills, sweats, or change in appetite.  Has fatigue and poor slep Eyes: see above.   No eye pain Ear, nose and throat: No hearing loss, ear pain, nasal congestion, sore throat Cardiovascular: No chest pain, palpitations Respiratory:  No shortness of breath at rest or with exertion.   No wheezes GastrointestinaI: Has UC - doing well.   No nausea, vomiting, diarrhea, abdominal pain, fecal incontinence Genitourinary:  see above Musculoskeletal:  No neck pain, back pain Integumentary: No rash, pruritus, skin lesions Neurological: as above Psychiatric: as above. Endocrine: No palpitations, diaphoresis,  change in appetite, change in weigh or increased thirst Hematologic/Lymphatic:  No anemia, purpura, petechiae. Allergic/Immunologic: No itchy/runny eyes, nasal congestion, recent allergic reactions, rashes  ALLERGIES: Allergies  Allergen Reactions   Nitrofurantoin Other (See Comments)    syncope   Oxycodone -Acetaminophen  Other (See Comments)   Risperidone And Related Other (See Comments)    Became very lethargic   Penicillins Rash    Did it involve swelling of the face/tongue/throat, SOB, or low BP? No Did it involve sudden or severe rash/hives, skin peeling, or any reaction on the inside of your mouth or nose? Yes Did you need to seek medical attention at a hospital or doctor's office? No When did it last happen? Over 30 Years      If all above answers are NO, may proceed with cephalosporin use.      HOME MEDICATIONS: Outpatient Medications Prior to Visit  Medication Sig Dispense Refill   acyclovir  (ZOVIRAX ) 400 MG tablet Take 400 mg by mouth daily. Take every day per patient  0   b complex vitamins tablet Take 1 tablet by mouth daily.      baclofen  (LIORESAL ) 10 MG tablet Take 1  tablet (10 mg total) by mouth 4 (four) times daily. 1 to 2 po qid up to 8/day (Patient taking differently: Take 10 mg by mouth 4 (four) times daily.)     cholecalciferol  (VITAMIN D ) 1000 UNITS tablet Take 1,000 Units by mouth daily.      dantrolene  (DANTRIUM ) 50 MG capsule Take 1 capsule (50 mg total) by mouth 3 (three) times daily as needed. 270 capsule 3   famotidine  (PEPCID ) 40 MG tablet Take 40 mg by mouth 2 (two) times daily.     ferrous sulfate  325 (65 FE) MG tablet Take 325 mg by mouth daily with breakfast.     FLUoxetine  (PROZAC ) 20 MG capsule Take 20 mg by mouth 3 (three) times daily.     folic acid  (FOLVITE ) 400 MCG tablet Take 400 mcg by mouth daily.     furosemide  (LASIX ) 40 MG tablet Take 0.5 tablets (20 mg total) by mouth 2 (two) times daily.     labetalol  (NORMODYNE ) 100 MG tablet TAKE 1/2 TABLET BY MOUTH DAILY 45 tablet 2   levothyroxine  (SYNTHROID ) 25 MCG tablet Take 25 mcg by mouth every evening.     lidocaine -prilocaine  (EMLA ) cream Apply one inch before port-a cath access prn (Patient taking differently: Apply 1 Application topically daily as needed (port access).) 30 g 3   mesalamine  (APRISO ) 0.375 g 24 hr capsule Take 375 mg by mouth QID.     Multiple Vitamin (MULTIVITAMIN) tablet Take 1 tablet by mouth daily.     OXcarbazepine  (TRILEPTAL ) 150 MG tablet Take 1 tablet (150 mg total) by mouth 3 (three) times daily. 270 tablet 3   potassium chloride  (KLOR-CON ) 10 MEQ tablet Take 2 tablets (20 mEq total) by mouth daily. Take 4 pills daily for 3 days then continue taking as before (Patient taking differently: Take 10 mEq by mouth daily.) 30 tablet 0   rosuvastatin  (CRESTOR ) 40 MG tablet Take 40 mg by mouth at bedtime.     sodium chloride  (OCEAN) 0.65 % SOLN nasal spray Place 1 spray into both nostrils as needed for congestion. (Patient taking differently: Place 1 spray into both nostrils daily as needed for congestion.)  0   Teriflunomide  14 MG TABS Take 1 tablet (14 mg total) by  mouth daily. HOLD until cleared by neurology outpatient to resume.  tolterodine  (DETROL ) 2 MG tablet Take 2 mg by mouth 2 (two) times daily.   1   cefadroxil  (DURICEF) 500 MG capsule Take 1 capsule (500 mg total) by mouth 2 (two) times daily. 2 capsule 0   guaiFENesin  (ROBITUSSIN) 100 MG/5ML liquid Take 5 mLs by mouth every 4 (four) hours as needed for cough or to loosen phlegm. (Patient not taking: Reported on 09/19/2023) 120 mL 0   lubiprostone  (AMITIZA ) 8 MCG capsule Take 8 mcg by mouth 2 (two) times daily with a meal. (Patient not taking: Reported on 09/19/2023)     pantoprazole  (PROTONIX ) 40 MG tablet Take 1 tablet (40 mg total) by mouth daily. (Patient not taking: Reported on 09/19/2023) 30 tablet 0   QUEtiapine  (SEROQUEL ) 25 MG tablet One po qHS and one po every day prn (Patient taking differently: Take 25 mg by mouth at bedtime.) 60 tablet 11   No facility-administered medications prior to visit.    PAST MEDICAL HISTORY: Past Medical History:  Diagnosis Date   Headache    Herpes genitalis in women    History of kidney stones    Hypertension    Kidney stones    Movement disorder    Multiple sclerosis (HCC)    Neuropathy    Oral herpes    Ulcerative colitis (HCC)    Vision abnormalities     PAST SURGICAL HISTORY: Past Surgical History:  Procedure Laterality Date   BIOPSY  08/25/2023   Procedure: BIOPSY;  Surgeon: Saintclair Jasper, MD;  Location: [facility];  Service: Gastroenterology;;   ENDOMETRIAL ABLATION  10/10/2017   ESOPHAGOGASTRODUODENOSCOPY (EGD) WITH PROPOFOL  Left 08/25/2023   Procedure: ESOPHAGOGASTRODUODENOSCOPY (EGD) WITH PROPOFOL ;  Surgeon: Saintclair Jasper, MD;  Location: [facility];  Service: Gastroenterology;  Laterality: Left;   KIDNEY STONE SURGERY     PORTACATH PLACEMENT N/A 10/16/2017   Procedure: ULTRA SOUND GUIDED INSERTION PORT-A-CATH ERAS PATHWAY;  Surgeon: Kinsinger, Herlene Righter, MD;  Location: [facility];  Service: General;  Laterality: N/A;    FAMILY  HISTORY: Family History  Problem Relation Age of Onset   Dementia Mother    Hypertension Father    Hyperlipidemia Father    Diabetes Father    Heart disease Father    Breast cancer Neg Hx     SOCIAL HISTORY:  Social History   Socioeconomic History   Marital status: Married    Spouse name: Timaya Bojarski   Number of children: Not on file   Years of education: Not on file   Highest education level: Not on file  Occupational History   Not on file  Tobacco Use   Smoking status: Never   Smokeless tobacco: Never  Vaping Use   Vaping status: Never Used  Substance and Sexual Activity   Alcohol use: Yes    Comment: occasional glass of wine   Drug use: No   Sexual activity: Not on file  Other Topics Concern   Not on file  Social History Narrative   ** Merged History Encounter **       Social Drivers of Health   Financial Resource Strain: Not on file  Food Insecurity: No Food Insecurity (08/22/2023)   Hunger Vital Sign    Worried About Running Out of Food in the Last Year: Never true    Ran Out of Food in the Last Year: Never true  Transportation Needs: No Transportation Needs (08/22/2023)   PRAPARE - Administrator, Civil Service (Medical): No    Lack of Transportation (Non-Medical):  No  Physical Activity: Not on file  Stress: No Stress Concern Present (09/24/2020)   Received from [facility], [facility] of Occupational Health - Occupational Stress Questionnaire    Feeling of Stress : Only a little  Social Connections: Moderately Integrated (08/22/2023)   Social Connection and Isolation Panel [NHANES]    Frequency of Communication with Friends and Family: Twice a week    Frequency of Social Gatherings with Friends and Family: Once a week    Attends Religious Services: 1 to 4 times per year    Active Member of [employer] or Organizations: No    Attends Banker Meetings: Never    Marital Status: Married  Catering Manager Violence:  Not At Risk (08/22/2023)   Humiliation, Afraid, Rape, and Kick questionnaire    Fear of Current or Ex-Partner: No    Emotionally Abused: No    Physically Abused: No    Sexually Abused: No     PHYSICAL EXAM  Vitals:   09/19/23 1139  BP: 130/84  Pulse: 74     Weight = 175 pounds.   Patient is right handed.     There is no height or weight on file to calculate BMI.   General: The patient is well-developed and well-nourished and in no acute distress.  She is in a power wheelchair    Skin/extremities: Extremities are without rash.  She has edema at the ankles  Neurologic Exam  Mental status: The patient is alert and oriented.  Focus and attention was reduced.  Speech is normal.  Cranial nerves: Extraocular movements are full.   Facial strength and sensation is normal.  Trapezius strength is normal.  There is no dysarthria.  No obvious hearing deficits are noted.  Motor:  Muscle bulk is normal .  Muscle tone is mildly increased in the legs, right greater than left.  She has increased muscle tone in the arms, left >>right   She has 0/5 strength in the legs.. Strength is 1 to 2-/5 in the left arm and 2+ to 3/5 in the right arm proximal arm and 4/5 finger and wrist flexion.  Able to touch face but without much strength.  She is unable to write.  She is able to operate her joystick on the right..   Sensory: She appears to have symmetric vibration sensation in the arms and reduced vibration sensation at the knees.  Gait and station: She is wheelchair-bound and not able to stand or walk . Reflexes: She has increased reflexes in her legs.    DIAGNOSTIC DATA (LABS, IMAGING, TESTING) - I reviewed patient records, labs, notes, testing and imaging myself where available.   Recent Results (from the past 2160 hours)  CBC with Differential/Platelet     Status: Abnormal   Collection Time: 07/24/23 11:55 AM  Result Value Ref Range   WBC 10.2 3.4 - 10.8 x10E3/uL   RBC 2.92 (L) 3.77 - 5.28  x10E6/uL   Hemoglobin 8.4 (L) 11.1 - 15.9 g/dL   Hematocrit 72.6 (L) 65.9 - 46.6 %   MCV 94 79 - 97 fL   MCH 28.8 26.6 - 33.0 pg   MCHC 30.8 (L) 31.5 - 35.7 g/dL   RDW 81.7 (H) 88.2 - 84.5 %   Platelets 136 (L) 150 - 450 x10E3/uL   Neutrophils 78 Not Estab. %   Lymphs 6 Not Estab. %   Monocytes 11 Not Estab. %   Eos 3 Not Estab. %   Basos 1  Not Estab. %   Neutrophils Absolute 8.0 (H) 1.4 - 7.0 x10E3/uL   Lymphocytes Absolute 0.6 (L) 0.7 - 3.1 x10E3/uL   Monocytes Absolute 1.1 (H) 0.1 - 0.9 x10E3/uL   EOS (ABSOLUTE) 0.3 0.0 - 0.4 x10E3/uL   Basophils Absolute 0.1 0.0 - 0.2 x10E3/uL   Immature Granulocytes 1 Not Estab. %   Immature Grans (Abs) 0.1 0.0 - 0.1 x10E3/uL  CMP     Status: Abnormal   Collection Time: 07/24/23 11:55 AM  Result Value Ref Range   Glucose 96 70 - 99 mg/dL   BUN 32 (H) 6 - 24 mg/dL   Creatinine, Ser 8.31 (H) 0.57 - 1.00 mg/dL   eGFR 35 (L) >40 fO/fpw/8.26   BUN/Creatinine Ratio 19 9 - 23   Sodium 144 134 - 144 mmol/L   Potassium 4.7 3.5 - 5.2 mmol/L   Chloride 112 (H) 96 - 106 mmol/L   CO2 17 (L) 20 - 29 mmol/L   Calcium  8.9 8.7 - 10.2 mg/dL   Total Protein 6.2 6.0 - 8.5 g/dL   Albumin 3.9 3.8 - 4.9 g/dL   Globulin, Total 2.3 1.5 - 4.5 g/dL   Bilirubin Total 0.3 0.0 - 1.2 mg/dL   Alkaline Phosphatase 201 (H) 44 - 121 IU/L   AST 33 0 - 40 IU/L   ALT 33 (H) 0 - 32 IU/L  Hemoglobin A1c     Status: Abnormal   Collection Time: 07/24/23 11:55 AM  Result Value Ref Range   Hgb A1c MFr Bld 4.5 (L) 4.8 - 5.6 %    Comment:          Prediabetes: 5.7 - 6.4          Diabetes: >6.4          Glycemic control for adults with diabetes: <7.0    Est. average glucose Bld gHb Est-mCnc 82 mg/dL  TSH     Status: Abnormal   Collection Time: 07/24/23 11:55 AM  Result Value Ref Range   TSH 8.160 (H) 0.450 - 4.500 uIU/mL  CBC with Differential     Status: Abnormal   Collection Time: 08/21/23 12:37 PM  Result Value Ref Range   WBC 14.2 (H) 4.0 - 10.5 K/uL   RBC 3.02  (L) 3.87 - 5.11 MIL/uL   Hemoglobin 9.4 (L) 12.0 - 15.0 g/dL   HCT 69.4 (L) 63.9 - 53.9 %   MCV 101.0 (H) 80.0 - 100.0 fL   MCH 31.1 26.0 - 34.0 pg   MCHC 30.8 30.0 - 36.0 g/dL   RDW 82.7 (H) 88.4 - 84.4 %   Platelets 182 150 - 400 K/uL   nRBC 0.0 0.0 - 0.2 %   Neutrophils Relative % 79 %   Neutro Abs 11.2 (H) 1.7 - 7.7 K/uL   Lymphocytes Relative 6 %   Lymphs Abs 0.9 0.7 - 4.0 K/uL   Monocytes Relative 13 %   Monocytes Absolute 1.9 (H) 0.1 - 1.0 K/uL   Eosinophils Relative 1 %   Eosinophils Absolute 0.2 0.0 - 0.5 K/uL   Basophils Relative 0 %   Basophils Absolute 0.1 0.0 - 0.1 K/uL   Immature Granulocytes 1 %   Abs Immature Granulocytes 0.10 (H) 0.00 - 0.07 K/uL    Comment: Performed at [facility], [address], [place], [place] [address]  Comprehensive metabolic panel     Status: Abnormal   Collection Time: 08/21/23 12:37 PM  Result Value Ref Range   Sodium 138 135 -  145 mmol/L   Potassium 4.3 3.5 - 5.1 mmol/L   Chloride 111 98 - 111 mmol/L   CO2 14 (L) 22 - 32 mmol/L   Glucose, Bld 123 (H) 70 - 99 mg/dL    Comment: Glucose reference range applies only to samples taken after fasting for at least 8 hours.   BUN 44 (H) 6 - 20 mg/dL   Creatinine, Ser 7.93 (H) 0.44 - 1.00 mg/dL   Calcium  9.2 8.9 - 10.3 mg/dL   Total Protein 7.5 6.5 - 8.1 g/dL   Albumin 3.6 3.5 - 5.0 g/dL   AST 18 15 - 41 U/L   ALT 27 0 - 44 U/L   Alkaline Phosphatase 153 (H) 38 - 126 U/L   Total Bilirubin 0.8 0.0 - 1.2 mg/dL   GFR, Estimated 27 (L) >60 mL/min    Comment: (NOTE) Calculated using the CKD-EPI Creatinine Equation (2021)    Anion gap 13 5 - 15    Comment: Performed at [facility], [address] [place], [place] [address]  Resp panel by RT-PCR (RSV, Flu A&B, Covid) Anterior Nasal Swab     Status: None   Collection Time: 08/21/23 12:37 PM   Specimen: Anterior Nasal Swab  Result Value Ref Range   SARS Coronavirus 2 by RT PCR NEGATIVE NEGATIVE     Comment: (NOTE) SARS-CoV-2 target nucleic acids are NOT DETECTED.  The SARS-CoV-2 RNA is generally detectable in upper respiratory specimens during the acute phase of infection. The lowest concentration of SARS-CoV-2 viral copies this assay can detect is 138 copies/mL. A negative result does not preclude SARS-Cov-2 infection and should not be used as the sole basis for treatment or other patient management decisions. A negative result may occur with  improper specimen collection/handling, submission of specimen other than nasopharyngeal swab, presence of viral mutation(s) within the areas targeted by this assay, and inadequate number of viral copies(<138 copies/mL). A negative result must be combined with clinical observations, patient history, and epidemiological information. The expected result is Negative.  Fact Sheet for Patients:  bloggercourse.com  Fact Sheet for Healthcare Providers:  seriousbroker.it  This test is no t yet approved or cleared by the [place]  FDA and  has been authorized for detection and/or diagnosis of SARS-CoV-2 by FDA under an Emergency Use Authorization (EUA). This EUA will remain  in effect (meaning this test can be used) for the duration of the COVID-19 declaration under Section 564(b)(1) of the Act, 21 U.S.C.section 360bbb-3(b)(1), unless the authorization is terminated  or revoked sooner.       Influenza A by PCR NEGATIVE NEGATIVE   Influenza B by PCR NEGATIVE NEGATIVE    Comment: (NOTE) The Xpert Xpress SARS-CoV-2/FLU/RSV plus assay is intended as an aid in the diagnosis of influenza from Nasopharyngeal swab specimens and should not be used as a sole basis for treatment. Nasal washings and aspirates are unacceptable for Xpert Xpress SARS-CoV-2/FLU/RSV testing.  Fact Sheet for Patients: bloggercourse.com  Fact Sheet for Healthcare  Providers: seriousbroker.it  This test is not yet approved or cleared by the [place]  FDA and has been authorized for detection and/or diagnosis of SARS-CoV-2 by FDA under an Emergency Use Authorization (EUA). This EUA will remain in effect (meaning this test can be used) for the duration of the COVID-19 declaration under Section 564(b)(1) of the Act, 21 U.S.C. section 360bbb-3(b)(1), unless the authorization is terminated or revoked.     Resp Syncytial Virus by PCR NEGATIVE NEGATIVE    Comment: (  NOTE) Fact Sheet for Patients: bloggercourse.com  Fact Sheet for Healthcare Providers: seriousbroker.it  This test is not yet approved or cleared by the [place]  FDA and has been authorized for detection and/or diagnosis of SARS-CoV-2 by FDA under an Emergency Use Authorization (EUA). This EUA will remain in effect (meaning this test can be used) for the duration of the COVID-19 declaration under Section 564(b)(1) of the Act, 21 U.S.C. section 360bbb-3(b)(1), unless the authorization is terminated or revoked.  Performed at [facility], [address] [place], [place] [address]   Urinalysis, w/ Reflex to Culture (Infection Suspected) -Urine, Clean Catch     Status: Abnormal   Collection Time: 08/21/23 10:53 PM  Result Value Ref Range   Specimen Source URINE, CLEAN CATCH    Color, Urine YELLOW YELLOW   APPearance TURBID (A) CLEAR   Specific Gravity, Urine 1.016 1.005 - 1.030   pH 5.0 5.0 - 8.0   Glucose, UA NEGATIVE NEGATIVE mg/dL   Hgb urine dipstick LARGE (A) NEGATIVE   Bilirubin Urine NEGATIVE NEGATIVE   Ketones, ur NEGATIVE NEGATIVE mg/dL   Protein, ur 899 (A) NEGATIVE mg/dL   Nitrite NEGATIVE NEGATIVE   Leukocytes,Ua MODERATE (A) NEGATIVE   RBC / HPF >50 0 - 5 RBC/hpf   WBC, UA >50 0 - 5 WBC/hpf    Comment:        Reflex urine culture not performed if WBC <=10, OR if  Squamous epithelial cells >5. If Squamous epithelial cells >5 suggest recollection.    Bacteria, UA MANY (A) NONE SEEN   Squamous Epithelial / HPF 21-50 0 - 5 /HPF   WBC Clumps PRESENT    Mucus PRESENT     Comment: Performed at [facility], [address], [place], [place] [address]  Blood Culture (routine x 2)     Status: None   Collection Time: 08/21/23 11:27 PM   Specimen: BLOOD RIGHT ARM  Result Value Ref Range   Specimen Description      BLOOD RIGHT ARM Performed at [facility], [address], [place], [place] [address]    Special Requests      BOTTLES DRAWN AEROBIC AND ANAEROBIC Blood Culture results may not be optimal due to an inadequate volume of blood received in culture bottles Performed at [facility], [address] [place], [place] [address]    Culture      NO GROWTH 5 DAYS Performed at [facility], [address], [place], [place] [address]    Report Status 08/27/2023 FINAL   Blood Culture (routine x 2)     Status: None   Collection Time: 08/21/23 11:28 PM   Specimen: BLOOD LEFT HAND  Result Value Ref Range   Specimen Description      BLOOD LEFT HAND Performed at [facility], [address] [place], [place] [address]    Special Requests      BOTTLES DRAWN AEROBIC AND ANAEROBIC Blood Culture results may not be optimal due to an inadequate volume of blood received in culture bottles Performed at [facility], [address], [place], [place] [address]    Culture      NO GROWTH 5 DAYS Performed at [facility], [address], [place], [place] [address]    Report Status 08/27/2023 FINAL   Protime-INR     Status: Abnormal   Collection Time: 08/21/23 11:30 PM  Result Value Ref Range  Prothrombin Time 15.3 (H) 11.4 - 15.2 seconds   INR 1.2 0.8 - 1.2    Comment: (NOTE) INR goal varies based on device and disease states. Performed at [facility], [address] [place], [place] [address]   APTT     Status: None   Collection Time: 08/21/23 11:30 PM  Result Value Ref Range   aPTT 31 24 - 36 seconds    Comment: Performed at [facility], [address] [place], [place] [address]  I-Stat Lactic Acid, ED     Status: None   Collection Time: 08/21/23 11:39 PM  Result Value Ref Range   Lactic Acid, Venous 0.7 0.5 - 1.9 mmol/L  I-Stat Lactic Acid, ED     Status: None   Collection Time: 08/22/23 12:58 AM  Result Value Ref Range   Lactic Acid, Venous 0.7 0.5 - 1.9 mmol/L  Basic metabolic panel     Status: Abnormal   Collection Time: 08/22/23  4:49 AM  Result Value Ref Range   Sodium 138 135 - 145 mmol/L   Potassium 3.9 3.5 - 5.1 mmol/L   Chloride 111 98 - 111 mmol/L   CO2 14 (L) 22 - 32 mmol/L   Glucose, Bld 105 (H) 70 - 99 mg/dL    Comment: Glucose reference range applies only to samples taken after fasting for at least 8 hours.   BUN 39 (H) 6 - 20 mg/dL   Creatinine, Ser 7.98 (H) 0.44 - 1.00 mg/dL   Calcium  8.6 (L) 8.9 - 10.3 mg/dL   GFR, Estimated 28 (L) >60 mL/min    Comment: (NOTE) Calculated using the CKD-EPI Creatinine Equation (2021)    Anion gap 13 5 - 15    Comment: Performed at [facility], [address], [place], [place] [address]  CBC     Status: Abnormal   Collection Time: 08/22/23  4:49 AM  Result Value Ref Range   WBC 15.6 (H) 4.0 - 10.5 K/uL   RBC 2.61 (L) 3.87 - 5.11 MIL/uL   Hemoglobin 8.1 (L) 12.0 - 15.0 g/dL   HCT 73.3 (L) 63.9 - 53.9 %   MCV 101.9 (H) 80.0 - 100.0 fL   MCH 31.0 26.0 - 34.0 pg   MCHC 30.5 30.0 - 36.0 g/dL   RDW 83.3 (H) 88.4 - 84.4 %   Platelets 187 150 - 400 K/uL   nRBC 0.0 0.0 - 0.2 %    Comment: Performed at [facility], [address] [place], [place] [address]  CBC     Status: Abnormal   Collection Time: 08/23/23 11:34 AM  Result Value Ref Range   WBC 9.5 4.0 - 10.5 K/uL   RBC 2.34 (L) 3.87 -  5.11 MIL/uL   Hemoglobin 7.2 (L) 12.0 - 15.0 g/dL   HCT 76.1 (L) 63.9 - 53.9 %   MCV 101.7 (H) 80.0 - 100.0 fL   MCH 30.8 26.0 - 34.0 pg   MCHC 30.3 30.0 - 36.0 g/dL   RDW 83.5 (H) 88.4 - 84.4 %   Platelets 182 150 - 400 K/uL   nRBC 0.0 0.0 - 0.2 %    Comment: Performed at [facility], [address], [place], [place] [address]  Comprehensive metabolic panel     Status: Abnormal   Collection Time: 08/23/23 11:34 AM  Result Value Ref Range   Sodium 142 135 - 145 mmol/L   Potassium 3.6 3.5 - 5.1 mmol/L  Chloride 115 (H) 98 - 111 mmol/L   CO2 17 (L) 22 - 32 mmol/L   Glucose, Bld 114 (H) 70 - 99 mg/dL    Comment: Glucose reference range applies only to samples taken after fasting for at least 8 hours.   BUN 25 (H) 6 - 20 mg/dL   Creatinine, Ser 8.47 (H) 0.44 - 1.00 mg/dL   Calcium  8.3 (L) 8.9 - 10.3 mg/dL   Total Protein 5.7 (L) 6.5 - 8.1 g/dL   Albumin 2.4 (L) 3.5 - 5.0 g/dL   AST 18 15 - 41 U/L   ALT 20 0 - 44 U/L   Alkaline Phosphatase 120 38 - 126 U/L   Total Bilirubin 0.3 0.0 - 1.2 mg/dL   GFR, Estimated 40 (L) >60 mL/min    Comment: (NOTE) Calculated using the CKD-EPI Creatinine Equation (2021)    Anion gap 10 5 - 15    Comment: Performed at [facility], [address] [place], [place] [address]  MRSA Next Gen by PCR, Nasal     Status: None   Collection Time: 08/23/23  4:44 PM   Specimen: Nasal Mucosa; Nasal Swab  Result Value Ref Range   MRSA by PCR Next Gen NOT DETECTED NOT DETECTED    Comment: (NOTE) The GeneXpert MRSA Assay (FDA approved for NASAL specimens only), is one component of a comprehensive MRSA colonization surveillance program. It is not intended to diagnose MRSA infection nor to guide or monitor treatment for MRSA infections. Test performance is not FDA approved in patients less than 69 years old. Performed at [facility], [address], [place], [place] [address]   CBC     Status:  Abnormal   Collection Time: 08/24/23  3:03 AM  Result Value Ref Range   WBC 7.9 4.0 - 10.5 K/uL   RBC 2.20 (L) 3.87 - 5.11 MIL/uL   Hemoglobin 6.8 (LL) 12.0 - 15.0 g/dL    Comment: REPEATED TO VERIFY THIS CRITICAL RESULT HAS VERIFIED AND BEEN CALLED TO RODERICK RN BY GOLSON,M ON 01 10 2025 AT 0331, AND HAS BEEN READ BACK.     HCT 22.2 (L) 36.0 - 46.0 %   MCV 100.9 (H) 80.0 - 100.0 fL   MCH 30.9 26.0 - 34.0 pg   MCHC 30.6 30.0 - 36.0 g/dL   RDW 83.6 (H) 88.4 - 84.4 %   Platelets 173 150 - 400 K/uL   nRBC 0.0 0.0 - 0.2 %    Comment: Performed at [facility], [address] [place], [place] [address]  Comprehensive metabolic panel     Status: Abnormal   Collection Time: 08/24/23  3:03 AM  Result Value Ref Range   Sodium 140 135 - 145 mmol/L   Potassium 3.0 (L) 3.5 - 5.1 mmol/L   Chloride 115 (H) 98 - 111 mmol/L   CO2 17 (L) 22 - 32 mmol/L   Glucose, Bld 96 70 - 99 mg/dL    Comment: Glucose reference range applies only to samples taken after fasting for at least 8 hours.   BUN 23 (H) 6 - 20 mg/dL   Creatinine, Ser 8.59 (H) 0.44 - 1.00 mg/dL   Calcium  8.0 (L) 8.9 - 10.3 mg/dL   Total Protein 5.5 (L) 6.5 - 8.1 g/dL   Albumin 2.6 (L) 3.5 - 5.0 g/dL   AST 26 15 - 41 U/L   ALT 24 0 - 44 U/L   Alkaline Phosphatase 120 38 - 126 U/L  Total Bilirubin 0.5 0.0 - 1.2 mg/dL   GFR, Estimated 44 (L) >60 mL/min    Comment: (NOTE) Calculated using the CKD-EPI Creatinine Equation (2021)    Anion gap 8 5 - 15    Comment: Performed at [facility], [address], [place], [place] [address]  ABO/Rh     Status: None   Collection Time: 08/24/23  3:03 AM  Result Value Ref Range   ABO/RH(D)      A POS Performed at [facility], [address], [place], [place] [address]   Type and screen [facility]     Status: None   Collection Time: 08/24/23 11:05 AM  Result Value Ref Range   ABO/RH(D) A POS    Antibody Screen  NEG    Sample Expiration 08/27/2023,2359    Unit Number [id number]    Blood Component Type RED CELLS,LR    Unit division 00    Status of Unit ISSUED,FINAL    Transfusion Status OK TO TRANSFUSE    Crossmatch Result      Compatible Performed at [facility], [address], [place], [place] [address]   Prepare RBC (crossmatch)     Status: None   Collection Time: 08/24/23 11:05 AM  Result Value Ref Range   Order Confirmation      ORDER PROCESSED BY BLOOD BANK Performed at [facility], [address], [place], [place] [address]   Vitamin B12     Status: Abnormal   Collection Time: 08/24/23 11:05 AM  Result Value Ref Range   Vitamin B-12 1,448 (H) 180 - 914 pg/mL    Comment: (NOTE) This assay is not validated for testing neonatal or myeloproliferative syndrome specimens for Vitamin B12 levels. Performed at [facility], [address], [place], [place] [address]   Folate     Status: None   Collection Time: 08/24/23 11:05 AM  Result Value Ref Range   Folate 39.0 >5.9 ng/mL    Comment: RESULT CONFIRMED BY MANUAL DILUTION Performed at [facility], [address] [place], [place] [address]   Iron  and TIBC     Status: Abnormal   Collection Time: 08/24/23 11:05 AM  Result Value Ref Range   Iron  34 28 - 170 ug/dL   TIBC 804 (L) 749 - 549 ug/dL   Saturation Ratios 18 10.4 - 31.8 %   UIBC 161 ug/dL    Comment: Performed at [facility], [address], [place], [place] [address]  Ferritin     Status: None   Collection Time: 08/24/23 11:05 AM  Result Value Ref Range   Ferritin 236 11 - 307 ng/mL    Comment: Performed at [facility], [address] [place], [place] [address]  Reticulocytes     Status: Abnormal   Collection Time: 08/24/23 11:05 AM  Result Value Ref Range   Retic Ct Pct 3.2 (H) 0.4 - 3.1 %   RBC. 2.40 (L) 3.87 - 5.11 MIL/uL   Retic Count,  Absolute 77.5 19.0 - 186.0 K/uL   Immature Retic Fract 23.8 (H) 2.3 - 15.9 %    Comment: Performed at [facility], [address], [place], [place] [address]  BPAM RBC     Status: None   Collection Time: 08/24/23 11:05 AM  Result Value Ref Range   ISSUE DATE / TIME [id number]    Blood Product Unit Number [id number]  PRODUCT CODE [id number]    Unit Type and Rh 6200    Blood Product Expiration Date 797497927640   CBC     Status: Abnormal   Collection Time: 08/25/23 12:26 AM  Result Value Ref Range   WBC 8.7 4.0 - 10.5 K/uL   RBC 2.69 (L) 3.87 - 5.11 MIL/uL   Hemoglobin 8.6 (L) 12.0 - 15.0 g/dL    Comment: REPEATED TO VERIFY POST TRANSFUSION SPECIMEN    HCT 27.0 (L) 36.0 - 46.0 %   MCV 100.4 (H) 80.0 - 100.0 fL   MCH 32.0 26.0 - 34.0 pg   MCHC 31.9 30.0 - 36.0 g/dL   RDW 83.8 (H) 88.4 - 84.4 %   Platelets 176 150 - 400 K/uL   nRBC 0.0 0.0 - 0.2 %    Comment: Performed at [facility], [address], [place], [place] [address]  Basic metabolic panel     Status: Abnormal   Collection Time: 08/25/23 12:26 AM  Result Value Ref Range   Sodium 145 135 - 145 mmol/L   Potassium 3.6 3.5 - 5.1 mmol/L   Chloride 118 (H) 98 - 111 mmol/L   CO2 16 (L) 22 - 32 mmol/L   Glucose, Bld 121 (H) 70 - 99 mg/dL    Comment: Glucose reference range applies only to samples taken after fasting for at least 8 hours.   BUN 19 6 - 20 mg/dL   Creatinine, Ser 8.52 (H) 0.44 - 1.00 mg/dL   Calcium  8.0 (L) 8.9 - 10.3 mg/dL   GFR, Estimated 41 (L) >60 mL/min    Comment: (NOTE) Calculated using the CKD-EPI Creatinine Equation (2021)    Anion gap 11 5 - 15    Comment: Performed at [facility], [address] [place], [place] [address]  Surgical pathology     Status: None   Collection Time: 08/25/23  1:38 PM  Result Value Ref Range   SURGICAL PATHOLOGY      SURGICAL PATHOLOGY CASE: [id number] PATIENT: [facility]  Report     Clinical History: Melena, anemia, R/O H pylori, R/O Candida  (las)     FINAL MICROSCOPIC DIAGNOSIS:  A. ANTRUM, BODY, BIOPSY:      Gastric antral / oxyntic mucosa without significant diagnostic alteration.      One fragment of gastroduodenal mucosa with reactive epithelial changes.      No H. pylori identified on HE stain.      Negative for dysplasia.  B. ESOPHAGOUS, BIOPSY:      Esophageal squamous mucosa with no significant diagnostic alteration.      No evidence of intraepithelial eosinophils or lymphocytes.   GROSS DESCRIPTION:  A: Received in formalin are tan, soft tissue fragments that are submitted in toto. Number: 4 size: 0.3-0.4 cm blocks: 1  B: Received in formalin are tan, soft tissue fragments that are submitted in toto. Number: 3 size: 0.3-0.5 cm blocks: 1 (GRP 08/27/2023)   Final Diagnosis performed by Pepper Dutton, MD.   Electronically signed 1/ 14/2025 Technical component performed at [facility], [address], [place], [place] [address].  Professional component performed at [employer], [address] [place], [place] [address].  Immunohistochemistry Technical component (if applicable) was performed at [facility]. [address], [place], [place] [address].   IMMUNOHISTOCHEMISTRY DISCLAIMER (if applicable): Some of these immunohistochemical stains may have been developed and the performance characteristics determine by [facility]. Some  may not have been cleared or approved by the U.S. Food and Drug Administration. The FDA has determined that such clearance or approval is not necessary. This test is used for clinical purposes. It should not be regarded as investigational or for research. This laboratory is certified under the Clinical Laboratory Improvement Amendments of 1988 (CLIA-88) as qualified to perform high complexity cli nical laboratory testing.  The  controls stained appropriately.   IHC stains are performed on formalin fixed, paraffin embedded tissue using a 3,3diaminobenzidine (DAB) chromogen and Leica Bond Autostainer System. The staining intensity of the nucleus is score manually and is reported as the percentage of tumor cell nuclei demonstrating specific nuclear staining. The specimens are fixed in 10% Neutral Formalin for at least 6 hours and up to 72hrs. These tests are validated on decalcified tissue. Results should be interpreted with caution given the possibility of false negative results on decalcified specimens. Antibody Clones are as follows ER-clone 66F, PR-clone 16, Ki67- clone MM1. Some of these immunohistochemical stains may have been developed and the performance characteristics determined by [facility].       ASSESSMENT AND PLAN  Multiple sclerosis (HCC)  High risk medication use  Obstructive sleep apnea  Central sleep apnea  Spastic quadriparesis (HCC)  Excessive daytime sleepiness  Port-A-Cath in place   Continue teriflunomide  14 mg.   She gets regular port flushes and we will check CBC with differential and liver function test.      Port will be flushed about every 4-6 weeks.  She can have liver function test when that time. She has severe OSA with Juliaette Razor and is on BiPAP ST 15/11 BUR 10   She was fit today for Resmed F40 standard headgear and medium cushion.   They will be building a handicap accessible home.  The husband plans on starting an overhead lift for improved safety compared to a [employer] lift.    rtc 6 months, sooner if ne or worsening neurologic issues  44-minute office visit with the majority of the time spent face-to-face for history and physical, discussion/counseling and decision-making.  Additional time with record review and documentation.   This visit is part of a comprehensive longitudinal care medical relationship regarding the patients primary diagnosis of MS and  related concerns.  Valena Ivanov A. Vear, MD, PhD 09/19/2023, 6:02 PM Certified in Neurology, Clinical Neurophysiology, Sleep Medicine, Pain Medicine and Neuroimaging  [facility] [address], [address] [place], [place] [address] [phone]  Addendum 09/08/20:  Fixed error in history - correct statement should be she switched from Tysabri to Vumerity. -- RAS

## 2023-10-09 ENCOUNTER — Telehealth: Payer: Self-pay

## 2023-10-09 DIAGNOSIS — G4733 Obstructive sleep apnea (adult) (pediatric): Secondary | ICD-10-CM

## 2023-10-09 NOTE — Telephone Encounter (Signed)
 [employer] Rep has reached out to see if you would like to make any changes on patient's BiPAP settings due to DL still showing high residual AHI. \

## 2023-10-10 NOTE — Addendum Note (Signed)
 Addended by: Raynald Kemp A on: 10/10/2023 04:33 PM   Modules accepted: Orders

## 2023-10-10 NOTE — Telephone Encounter (Signed)
 Order placed and sent to [employer]

## 2023-10-15 ENCOUNTER — Other Ambulatory Visit: Payer: Self-pay | Admitting: *Deleted

## 2023-10-15 ENCOUNTER — Other Ambulatory Visit: Payer: Self-pay

## 2023-10-15 ENCOUNTER — Encounter: Payer: Self-pay | Admitting: Neurology

## 2023-10-15 DIAGNOSIS — Z79899 Other long term (current) drug therapy: Secondary | ICD-10-CM

## 2023-10-15 DIAGNOSIS — G35 Multiple sclerosis: Secondary | ICD-10-CM

## 2023-10-15 NOTE — Telephone Encounter (Signed)
 Pt's husband called wanting to know if his mychart message has been seen.

## 2023-10-15 NOTE — Telephone Encounter (Signed)
 Gave signed orders below to [facility].

## 2023-10-16 LAB — HEPATIC FUNCTION PANEL
ALT: 25 IU/L (ref 0–32)
AST: 22 IU/L (ref 0–40)
Albumin: 4.3 g/dL (ref 3.8–4.9)
Alkaline Phosphatase: 135 IU/L — ABNORMAL HIGH (ref 44–121)
Bilirubin Total: <0.2 mg/dL (ref 0.0–1.2)
Bilirubin, Direct: 0.11 mg/dL (ref 0.00–0.40)
Total Protein: 6.2 g/dL (ref 6.0–8.5)

## 2023-10-16 LAB — CBC WITH DIFFERENTIAL/PLATELET
Basophils Absolute: 0.1 10*3/uL (ref 0.0–0.2)
Basos: 1 %
EOS (ABSOLUTE): 0.3 10*3/uL (ref 0.0–0.4)
Eos: 5 %
Hematocrit: 34.4 % (ref 34.0–46.6)
Hemoglobin: 10.8 g/dL — ABNORMAL LOW (ref 11.1–15.9)
Immature Grans (Abs): 0 10*3/uL (ref 0.0–0.1)
Immature Granulocytes: 0 %
Lymphocytes Absolute: 0.6 10*3/uL — ABNORMAL LOW (ref 0.7–3.1)
Lymphs: 13 %
MCH: 30.8 pg (ref 26.6–33.0)
MCHC: 31.4 g/dL — ABNORMAL LOW (ref 31.5–35.7)
MCV: 98 fL — ABNORMAL HIGH (ref 79–97)
Monocytes Absolute: 0.6 10*3/uL (ref 0.1–0.9)
Monocytes: 13 %
Neutrophils Absolute: 3.4 10*3/uL (ref 1.4–7.0)
Neutrophils: 68 %
Platelets: 103 10*3/uL — ABNORMAL LOW (ref 150–450)
RBC: 3.51 x10E6/uL — ABNORMAL LOW (ref 3.77–5.28)
RDW: 13.8 % (ref 11.7–15.4)
WBC: 5 10*3/uL (ref 3.4–10.8)

## 2023-10-16 LAB — TSH: TSH: 7.34 u[IU]/mL — ABNORMAL HIGH (ref 0.450–4.500)

## 2023-11-26 ENCOUNTER — Other Ambulatory Visit: Payer: Self-pay

## 2023-11-26 ENCOUNTER — Encounter: Payer: Self-pay | Admitting: Neurology

## 2023-11-26 DIAGNOSIS — G4733 Obstructive sleep apnea (adult) (pediatric): Secondary | ICD-10-CM

## 2023-11-26 DIAGNOSIS — G35 Multiple sclerosis: Secondary | ICD-10-CM

## 2023-11-26 DIAGNOSIS — E038 Other specified hypothyroidism: Secondary | ICD-10-CM

## 2023-11-26 NOTE — Telephone Encounter (Signed)
 Pt would sent message stating " Also please look at bi pap data. Events are still high, do we need to change anything? "

## 2023-11-26 NOTE — Telephone Encounter (Signed)
 Marland Kitchen

## 2023-11-27 ENCOUNTER — Encounter: Payer: Self-pay | Admitting: Neurology

## 2023-11-27 LAB — TSH: TSH: 7.84 u[IU]/mL — ABNORMAL HIGH (ref 0.450–4.500)

## 2023-12-02 ENCOUNTER — Encounter: Payer: Self-pay | Admitting: Neurology

## 2023-12-03 ENCOUNTER — Other Ambulatory Visit: Payer: Self-pay | Admitting: Neurology

## 2023-12-03 MED ORDER — BACLOFEN 10 MG PO TABS: 10.0000 mg | ORAL_TABLET | Freq: Four times a day (QID) | ORAL | 11 refills | Status: DC

## 2023-12-03 NOTE — Telephone Encounter (Signed)
 Dr.Sater are you okay with sending Rx into pharmacy?   Rx was last prescribed in 04/2023 by Verlyn Goad, MD ( [facility] )

## 2023-12-14 ENCOUNTER — Encounter: Payer: Self-pay | Admitting: Neurology

## 2023-12-17 NOTE — Telephone Encounter (Signed)
 Last pressure setting were changed as of 11/26/23 " DME to change to 19/15 bipap pressure " per recent order

## 2023-12-17 NOTE — Telephone Encounter (Signed)
 Vanessa Short

## 2023-12-26 ENCOUNTER — Other Ambulatory Visit: Payer: Self-pay | Admitting: *Deleted

## 2023-12-26 ENCOUNTER — Other Ambulatory Visit: Payer: Self-pay | Admitting: Neurology

## 2023-12-26 DIAGNOSIS — R063 Periodic breathing: Secondary | ICD-10-CM

## 2023-12-26 DIAGNOSIS — G4731 Primary central sleep apnea: Secondary | ICD-10-CM

## 2023-12-26 DIAGNOSIS — G4733 Obstructive sleep apnea (adult) (pediatric): Secondary | ICD-10-CM

## 2023-12-31 ENCOUNTER — Encounter: Payer: Self-pay | Admitting: Neurology

## 2024-01-01 ENCOUNTER — Other Ambulatory Visit: Payer: Self-pay | Admitting: Neurology

## 2024-01-01 DIAGNOSIS — E559 Vitamin D deficiency, unspecified: Secondary | ICD-10-CM

## 2024-01-01 DIAGNOSIS — R7989 Other specified abnormal findings of blood chemistry: Secondary | ICD-10-CM

## 2024-01-01 DIAGNOSIS — G35 Multiple sclerosis: Secondary | ICD-10-CM

## 2024-01-01 DIAGNOSIS — Z79899 Other long term (current) drug therapy: Secondary | ICD-10-CM

## 2024-01-03 ENCOUNTER — Telehealth: Payer: Self-pay | Admitting: Neurology

## 2024-01-03 NOTE — Telephone Encounter (Signed)
 Bipap [employer] medicare no auth req spoke to [place] ref # [id number] *pt husband will stay with her like last time   Patient is scheduled at [facility] for 02/12/24 at 9 pm.   Mailed packet and sent mychart

## 2024-01-10 ENCOUNTER — Other Ambulatory Visit: Payer: Self-pay

## 2024-01-10 DIAGNOSIS — G35 Multiple sclerosis: Secondary | ICD-10-CM

## 2024-01-10 DIAGNOSIS — E559 Vitamin D deficiency, unspecified: Secondary | ICD-10-CM

## 2024-01-10 DIAGNOSIS — R7989 Other specified abnormal findings of blood chemistry: Secondary | ICD-10-CM

## 2024-01-10 DIAGNOSIS — Z79899 Other long term (current) drug therapy: Secondary | ICD-10-CM

## 2024-01-14 ENCOUNTER — Ambulatory Visit: Payer: Self-pay | Admitting: Neurology

## 2024-01-14 LAB — SPECIMEN STATUS REPORT

## 2024-01-15 LAB — VITAMIN D 25 HYDROXY (VIT D DEFICIENCY, FRACTURES): Vit D, 25-Hydroxy: 53.5 ng/mL (ref 30.0–100.0)

## 2024-01-15 LAB — CBC WITH DIFFERENTIAL/PLATELET
Basophils Absolute: 0 10*3/uL (ref 0.0–0.2)
Basos: 1 %
EOS (ABSOLUTE): 0.3 10*3/uL (ref 0.0–0.4)
Eos: 3 %
Hematocrit: 31.8 % — ABNORMAL LOW (ref 34.0–46.6)
Hemoglobin: 9.8 g/dL — ABNORMAL LOW (ref 11.1–15.9)
Immature Grans (Abs): 0 10*3/uL (ref 0.0–0.1)
Immature Granulocytes: 0 %
Lymphocytes Absolute: 0.8 10*3/uL (ref 0.7–3.1)
Lymphs: 10 %
MCH: 31.9 pg (ref 26.6–33.0)
MCHC: 30.8 g/dL — ABNORMAL LOW (ref 31.5–35.7)
MCV: 104 fL — ABNORMAL HIGH (ref 79–97)
Monocytes Absolute: 0.9 10*3/uL (ref 0.1–0.9)
Monocytes: 12 %
Neutrophils Absolute: 5.7 10*3/uL (ref 1.4–7.0)
Neutrophils: 74 %
Platelets: 85 10*3/uL — CL (ref 150–450)
RBC: 3.07 x10E6/uL — ABNORMAL LOW (ref 3.77–5.28)
RDW: 12 % (ref 11.7–15.4)
WBC: 7.7 10*3/uL (ref 3.4–10.8)

## 2024-01-15 LAB — BASIC METABOLIC PANEL WITH GFR
BUN/Creatinine Ratio: 27 — ABNORMAL HIGH (ref 9–23)
BUN: 51 mg/dL — ABNORMAL HIGH (ref 6–24)
CO2: 17 mmol/L — ABNORMAL LOW (ref 20–29)
Calcium: 9.1 mg/dL (ref 8.7–10.2)
Chloride: 114 mmol/L — ABNORMAL HIGH (ref 96–106)
Creatinine, Ser: 1.92 mg/dL — ABNORMAL HIGH (ref 0.57–1.00)
Glucose: 92 mg/dL (ref 70–99)
Potassium: 5 mmol/L (ref 3.5–5.2)
Sodium: 147 mmol/L — ABNORMAL HIGH (ref 134–144)
eGFR: 30 mL/min/{1.73_m2} — ABNORMAL LOW (ref >59)

## 2024-01-15 LAB — THYROID PANEL WITH TSH
Free Thyroxine Index: 1.6 (ref 1.2–4.9)
T3 Uptake Ratio: 25 % (ref 24–39)
T4, Total: 6.4 ug/dL (ref 4.5–12.0)
TSH: 4.84 u[IU]/mL — ABNORMAL HIGH (ref 0.450–4.500)

## 2024-01-15 LAB — HEMOGLOBIN A1C
Est. average glucose Bld gHb Est-mCnc: 82 mg/dL
Hgb A1c MFr Bld: 4.5 % — ABNORMAL LOW (ref 4.8–5.6)

## 2024-01-15 LAB — PARATHYROID HORMONE, INTACT (NO CA): PTH: 177 pg/mL — ABNORMAL HIGH (ref 15–65)

## 2024-01-23 ENCOUNTER — Encounter: Payer: Self-pay | Admitting: Neurology

## 2024-01-23 DIAGNOSIS — N1832 Chronic kidney disease, stage 3b: Secondary | ICD-10-CM

## 2024-01-23 DIAGNOSIS — G35 Multiple sclerosis: Secondary | ICD-10-CM

## 2024-01-23 DIAGNOSIS — Z79899 Other long term (current) drug therapy: Secondary | ICD-10-CM

## 2024-01-24 NOTE — Telephone Encounter (Signed)
 Paper lab order received from [facility] at [facility] order by [facility]. He wanted pt to have BMP, Dr.Sater said it was fine and he would like pt to have CBC with Diff.   Both orders placed  Polly Brink CMA asked BMP results be faxed to [phone] once final.

## 2024-02-12 ENCOUNTER — Ambulatory Visit: Admitting: Neurology

## 2024-02-12 DIAGNOSIS — G4733 Obstructive sleep apnea (adult) (pediatric): Secondary | ICD-10-CM | POA: Diagnosis not present

## 2024-02-12 DIAGNOSIS — R063 Periodic breathing: Secondary | ICD-10-CM

## 2024-02-14 ENCOUNTER — Telehealth: Payer: Self-pay | Admitting: Neurology

## 2024-02-14 NOTE — Progress Notes (Signed)
 General Information  Name: Vanessa Short, Vanessa Short BMI: 0.00 Physician: ,   ID: [id number] Height: 0.0 in Technician: Harvey Brisker  Sex: Female Weight: 175.0 lb Record: [id number]  Age: 58 [[birth year]] Date: 02/12/2024 Scorer: Brisker Harvey  Medical & Medication History    Vanessa Short is a 58 y.o. woman diagnosed with relapsing remitting MS in 1989. She has severe sleep apnea with pAHI 3% of 82.7/h. On initial study, the central pAHI was 40/h. Cheyne-Stokes respirations were noted during 61% of sleep. Nocturnal hypoxemia with 41 minutes of sleep below SaO2 of 88% and 2 minutes below SaO2 of 85%. She had a BiPAP titration and pressors were increased to BiPAP 15/11 with a backup rate of 10.. She is trying to use BiPAP but has had difficulties with leakage. Zovirax , Vitamin B complex, Lioresal , Vitamin D , Dantrium , Pepcid , Ferrous Sulfate, Prozac , Folvite , Lasix , Normodyne , Synthroid , Lidocaine -Prilocaine , Apriso , Multivitamin, Trileptal , Klor-Con , Crestor , Sodium Chloride , Teriflunomide , Detrol , Seroquel      Sleep Summary   Obstructive sleep apnea with an AHI equals 13.8/h.  The majority of these were hypopneas (11/h) the central apnea index was only 0.2/h. Sleep efficiency 68%. No nocturnal hypoxemia was noted. Optimal AHI was seen with a BiPAP setting of 19/15 with backup rate 12. As there were insufficient central apnea events, ASV was not performed  Recommendations BiPAP settings can be adjusted to 19/15 backup rate 12.  If patient has trouble tolerating higher pressure recommend BiPAP 15/11 backup rate 12  Vanessa Deshmukh A. Vear, MD, PhD, FAAN Certified in Neurology, Clinical Neurophysiology, Sleep Medicine, Pain Medicine and Neuroimaging Director, Multiple Sclerosis [facility] at [facility]      Comments   The patient came into the lab for a BiPAP titration. The patient was fitted with a ResMed F40 (FFM) size Med. This is the patients at home interface. The patient is currently on  BiPAP ST at home. BiPAP ST was started at 15/11cmH2O BUR of 12. BiPAP ST was titrated up to 19/15cmH2O BUR 12. The patient had no restroom breaks. Patient has a catheter. EKG showed no obvious cardiac arrhythmias. No snoring. No REM. Respiratory events scored with a 4% desat. Slept supine only. The patient had a lot of transitional events. Respiratory events were more stable with consistent sleep. The patient did not have enough CSA to qualify to be switched to ASV.     Lights out: 10:36:29 PM Lights on: 05:16:53 AM   Time Total Supine Side Prone Upright  Recording (TRT) 6h 40.28m 6h 40.62m 0h 0.59m 0h 0.1m 0h 0.24m  Sleep (TST) 4h 29.37m 4h 29.29m 0h 0.18m 0h 0.42m 0h 0.34m   Latency N1 N2 N3 REM Onset Per. Slp. Eff.  Actual 0h 0.28m 0h 1.109m 0h 24.12m 0h 0.31m 0h 9.70m 0h 25.10m 67.17%   Stg Dur Wake N1 N2 N3 REM  Total 131.5 3.5 198.5 67.0 0.0  Supine 131.5 3.5 198.5 67.0 0.0  Side 0.0 0.0 0.0 0.0 0.0  Prone 0.0 0.0 0.0 0.0 0.0  Upright 0.0 0.0 0.0 0.0 0.0   Stg % Wake N1 N2 N3 REM  Total 32.8 1.3 73.8 24.9 0.0  Supine 32.8 1.3 73.8 24.9 0.0  Side 0.0 0.0 0.0 0.0 0.0  Prone 0.0 0.0 0.0 0.0 0.0  Upright 0.0 0.0 0.0 0.0 0.0     Apnea Summary Sub Supine Side Prone Upright  Total 12 Total 12 12 0 0 0    REM 0 0 0 0 0    NREM 12 12 0 0 0  Obs  11 REM 0 0 0 0 0    NREM 11 11 0 0 0  Mix 0 REM 0 0 0 0 0    NREM 0 0 0 0 0  Cen 1 REM 0 0 0 0 0    NREM 1 1 0 0 0   Rera Summary Sub Supine Side Prone Upright  Total 0 Total 0 0 0 0 0    REM 0 0 0 0 0    NREM 0 0 0 0 0   Hypopnea Summary Sub Supine Side Prone Upright  Total 50 Total 50 50 0 0 0    REM 0 0 0 0 0    NREM 50 50 0 0 0   4% Hypopnea Summary Sub Supine Side Prone Upright  Total (4%) 25 Total 25 25 0 0 0    REM 0 0 0 0 0    NREM 25 25 0 0 0     AHI Total Obs Mix Cen  13.83 Apnea 2.68 2.45 0.00 0.22   Hypopnea 11.15 -- -- --  8.25 Hypopnea (4%) 5.58 -- -- --    Total Supine Side Prone Upright  Position AHI 13.83 13.83 0.00 0.00  0.00  REM AHI 0.00   NREM AHI 13.83   Position RDI 13.83 13.83 0.00 0.00 0.00  REM RDI 0.00   NREM RDI 13.83    4% Hypopnea Total Supine Side Prone Upright  Position AHI (4%) 8.25 8.25 0.00 0.00 0.00  REM AHI (4%) 0.00   NREM AHI (4%) 8.25   Position RDI (4%) 8.25 8.25 0.00 0.00 0.00  REM RDI (4%) 0.00   NREM RDI (4%) 8.25    Desaturation Information Threshold: 2% <100% <90% <80% <70% <60% <50% <40%  Supine 148.0 10.0 0.0 0.0 0.0 0.0 0.0  Side 0.0 0.0 0.0 0.0 0.0 0.0 0.0  Prone 0.0 0.0 0.0 0.0 0.0 0.0 0.0  Upright 0.0 0.0 0.0 0.0 0.0 0.0 0.0  Total 148.0 10.0 0.0 0.0 0.0 0.0 0.0  Index 22.7 1.5 0.0 0.0 0.0 0.0 0.0   Threshold: 3% <100% <90% <80% <70% <60% <50% <40%  Supine 72.0 10.0 0.0 0.0 0.0 0.0 0.0  Side 0.0 0.0 0.0 0.0 0.0 0.0 0.0  Prone 0.0 0.0 0.0 0.0 0.0 0.0 0.0  Upright 0.0 0.0 0.0 0.0 0.0 0.0 0.0  Total 72.0 10.0 0.0 0.0 0.0 0.0 0.0  Index 11.0 1.5 0.0 0.0 0.0 0.0 0.0   Threshold: 4% <100% <90% <80% <70% <60% <50% <40%  Supine 41.0 10.0 0.0 0.0 0.0 0.0 0.0  Side 0.0 0.0 0.0 0.0 0.0 0.0 0.0  Prone 0.0 0.0 0.0 0.0 0.0 0.0 0.0  Upright 0.0 0.0 0.0 0.0 0.0 0.0 0.0  Total 41.0 10.0 0.0 0.0 0.0 0.0 0.0  Index 6.3 1.5 0.0 0.0 0.0 0.0 0.0   Threshold: 4% <100% <90% <80% <70% <60% <50% <40%  Supine 41 10 0 0 0 0 0  Side 0 0 0 0 0 0 0  Prone 0 0 0 0 0 0 0  Upright 0 0 0 0 0 0 0  Total 41 10 0 0 0 0 0   Awakening/Arousal Information # of Awakenings 80  Wake after sleep onset 122.49m  Wake after persistent sleep 117.25m   Arousal Assoc. Arousals Index  Apneas 12 2.7  Hypopneas 41 9.1  Leg Movements 0 0.0  Snore 0 0.0  PTT Arousals 0 0.0  Spontaneous 94 21.0  Total 147 32.8  Leg  Movement Information PLMS LMs Index  Total LMs during PLMS 0 0.0  LMs w/ Microarousals 0 0.0   LM LMs Index  w/ Microarousal 0 0.0  w/ Awakening 0 0.0  w/ Resp Event 0 0.0  Spontaneous 0 0.0  Total 0 0.0     Desaturation threshold setting: 4% Minimum desaturation setting: 10  seconds SaO2 nadir: 80% The longest event was a 41 sec obstructive Hypopnea with a minimum SaO2 of 94%. The lowest SaO2 was 50% associated with a 16 sec obstructive Hypopnea. EKG Rates EKG Avg Max Min  Awake 69 86 60  Asleep 64 82 58  EKG Events: N/A

## 2024-02-14 NOTE — Telephone Encounter (Signed)
 x

## 2024-02-17 ENCOUNTER — Encounter: Payer: Self-pay | Admitting: Neurology

## 2024-02-17 DIAGNOSIS — G35 Multiple sclerosis: Secondary | ICD-10-CM

## 2024-02-18 ENCOUNTER — Other Ambulatory Visit: Payer: Self-pay

## 2024-02-18 ENCOUNTER — Telehealth: Payer: Self-pay

## 2024-02-18 DIAGNOSIS — G4733 Obstructive sleep apnea (adult) (pediatric): Secondary | ICD-10-CM

## 2024-02-18 DIAGNOSIS — G4731 Primary central sleep apnea: Secondary | ICD-10-CM

## 2024-02-18 MED ORDER — TERIFLUNOMIDE 14 MG PO TABS: 1.0000 | ORAL_TABLET | Freq: Every day | ORAL | 0 refills | Status: DC

## 2024-02-18 NOTE — Telephone Encounter (Addendum)
 Called pt and let her know the below BiPAP Titration Results. Pt states that she is willing to try the increased pressure change and if she can't tolerate it she will let us  know.   Placing Order.   ----- Message from Charlie DELENA Crete sent at 02/14/2024  6:55 PM EDT ----- Regarding: BiPAP titration Please let her know that the BiPAP titration actually showed that the central apneas were very well-controlled on BiPAP.  The obstructive sleep apneas were best controlled on a higher pressure than the current settings .  Ask her if she thinks she  could tolerate a higher pressure?.  If so we can send in BiPAP ST 19/15 with backup rate 12

## 2024-02-18 NOTE — Telephone Encounter (Signed)
 Last see on 09/19/23 Follow up scheduled on 04/08/24

## 2024-03-27 ENCOUNTER — Other Ambulatory Visit: Payer: Self-pay | Admitting: Family Medicine

## 2024-03-27 DIAGNOSIS — Z1231 Encounter for screening mammogram for malignant neoplasm of breast: Secondary | ICD-10-CM

## 2024-03-29 ENCOUNTER — Encounter: Payer: Self-pay | Admitting: Neurology

## 2024-03-29 DIAGNOSIS — Z79899 Other long term (current) drug therapy: Secondary | ICD-10-CM

## 2024-03-29 DIAGNOSIS — G35 Multiple sclerosis: Secondary | ICD-10-CM

## 2024-03-31 NOTE — Telephone Encounter (Signed)
 Per [facility], pt coming for next [facility] flush 04/07/24

## 2024-03-31 NOTE — Telephone Encounter (Signed)
 Spoke w/ Dr. Vear, ok to order CBC diff/platelet, TSH and Lipid labs

## 2024-04-07 ENCOUNTER — Other Ambulatory Visit (INDEPENDENT_AMBULATORY_CARE_PROVIDER_SITE_OTHER): Payer: Self-pay

## 2024-04-07 DIAGNOSIS — G35 Multiple sclerosis: Secondary | ICD-10-CM

## 2024-04-07 DIAGNOSIS — Z79899 Other long term (current) drug therapy: Secondary | ICD-10-CM

## 2024-04-07 DIAGNOSIS — Z0289 Encounter for other administrative examinations: Secondary | ICD-10-CM

## 2024-04-08 ENCOUNTER — Ambulatory Visit (INDEPENDENT_AMBULATORY_CARE_PROVIDER_SITE_OTHER): Payer: Medicare Other | Admitting: Neurology

## 2024-04-08 ENCOUNTER — Encounter: Payer: Self-pay | Admitting: Neurology

## 2024-04-08 VITALS — BP 115/72 | HR 62 | Ht 62.0 in | Wt 180.0 lb

## 2024-04-08 DIAGNOSIS — G8250 Quadriplegia, unspecified: Secondary | ICD-10-CM

## 2024-04-08 DIAGNOSIS — G35 Multiple sclerosis: Secondary | ICD-10-CM

## 2024-04-08 DIAGNOSIS — Z79899 Other long term (current) drug therapy: Secondary | ICD-10-CM | POA: Diagnosis not present

## 2024-04-08 DIAGNOSIS — G4733 Obstructive sleep apnea (adult) (pediatric): Secondary | ICD-10-CM

## 2024-04-08 DIAGNOSIS — D649 Anemia, unspecified: Secondary | ICD-10-CM

## 2024-04-08 DIAGNOSIS — G35C1 Active secondary progressive multiple sclerosis: Secondary | ICD-10-CM

## 2024-04-08 DIAGNOSIS — G35D Multiple sclerosis, unspecified: Secondary | ICD-10-CM

## 2024-04-08 LAB — CBC WITH DIFFERENTIAL/PLATELET
Basophils Absolute: 0.1 x10E3/uL (ref 0.0–0.2)
Basos: 1 %
EOS (ABSOLUTE): 0.2 x10E3/uL (ref 0.0–0.4)
Eos: 3 %
Hematocrit: 22.6 % — ABNORMAL LOW (ref 34.0–46.6)
Hemoglobin: 7.3 g/dL — ABNORMAL LOW (ref 11.1–15.9)
Immature Grans (Abs): 0.1 x10E3/uL (ref 0.0–0.1)
Immature Granulocytes: 1 %
Lymphocytes Absolute: 0.5 x10E3/uL — ABNORMAL LOW (ref 0.7–3.1)
Lymphs: 7 %
MCH: 32.7 pg (ref 26.6–33.0)
MCHC: 32.3 g/dL (ref 31.5–35.7)
MCV: 101 fL — ABNORMAL HIGH (ref 79–97)
Monocytes Absolute: 0.7 x10E3/uL (ref 0.1–0.9)
Monocytes: 11 %
NRBC: 1 % — ABNORMAL HIGH (ref 0–0)
Neutrophils Absolute: 5.5 x10E3/uL (ref 1.4–7.0)
Neutrophils: 77 %
Platelets: 77 x10E3/uL — CL (ref 150–450)
RBC: 2.23 x10E6/uL — CL (ref 3.77–5.28)
RDW: 12.7 % (ref 11.7–15.4)
WBC: 7.1 x10E3/uL (ref 3.4–10.8)

## 2024-04-08 LAB — LIPID PANEL
Chol/HDL Ratio: 2.2 ratio (ref 0.0–4.4)
Cholesterol, Total: 138 mg/dL (ref 100–199)
HDL: 62 mg/dL (ref >39)
LDL Chol Calc (NIH): 59 mg/dL (ref 0–99)
Triglycerides: 94 mg/dL (ref 0–149)
VLDL Cholesterol Cal: 17 mg/dL (ref 5–40)

## 2024-04-08 LAB — TSH: TSH: 4.15 u[IU]/mL (ref 0.450–4.500)

## 2024-04-08 MED ORDER — BACLOFEN 10 MG PO TABS: 10.0000 mg | ORAL_TABLET | Freq: Four times a day (QID) | ORAL | 4 refills | Status: DC

## 2024-04-08 NOTE — Progress Notes (Addendum)
 [facility]  [name] DOB: 11-18-1965  REFERRING CLINICIAN: Doreatha Aho, [facility] HISTORY FROM: patient    REASON FOR VISIT: MS   HISTORICAL  CHIEF COMPLAINT:  Chief Complaint  Patient presents with   Follow-up    Pt in room 10.husband in room. Here for MS on teriflunomide .  Patient has alots of spasms and tremors. No falls, eye exam schedule this week. Patient would like baclofen  for 90 day supply. Patient on cpap doing well    HISTORY OF PRESENT ILLNESS:  Vanessa Short is a 58 y.o. woman diagnosed with relapsing remitting MS in 1989.   She currently has a relapsing/active form of second progressive MS  Update 05/15/2023: She is noting gradual progression of her weakness and spasticity.  She is on teriflunomide  and tolerates it well.    She has no GI upset.  She had felt best on Tysabri (stopped in 2019 when converted neg to pos).  She has anemia on recent CBC/D (Hgb=7.3).  I advised them to discuss with PCP as may need additional blood tests or transfusion.  (Had 2 U pRBC in 08/2023).  She had very low lymphocyte counts on Vumerity that took many months to improve and we started teriflunomide .  I would be reluctant to use an anti-CD20 agent as she has admissions every year for infection.  She had another infection and was hospitalized last month (severe sinus).  She was noted to have anemia and also needed to have EGD and will have an outpatient colonoscopy.  She remains bed and chair bound since she broke her right leg and left shoulder.  She is no longer able to assist with transfers.  She has a multi functional power wheelchair. Currently, strength is 0-1/5 in the legs and 2/5 in the left arm and 2+ to 4 -/5 in the right arm. She has urinary incontinence but has some voluntary control at times.   Vision is symmetric.Vanessa Short  Spasticity has been a problem and she is on Dantrium  50 mg 3 times a day and baclofen  10 mg 4 times a  day.   She notes fatigue.  She denies depression at this time.  Cognition is usually fine but has had issues when she has an infection.    She takes fluoxetine  60 mg daily.    she had had episodes of decreased responsiveness but an EEG showed normal activity.  She also has had hallucinations in the past, especially when she had infections.  She has a lot of spasticity in the legs.  Baclofen  has helped her some.  The dose was reduced recently due to her elevated creatinine..  She has had long QT on some EKG  OSA She snores and has witnessed OSA.    She is sleepy.   HST sin July showed severe sleep apnea with much of the night in Cheyne-Stokes respiration. Due to the combination of obstructive and central sleep apnea and neurologic dysfunction, there is a high likelihood that she will need advanced BiPAP mode such as ST or ASV .    She had a home sleep study in July 2024 showing severe sleep apnea (AHI of 86/h) and much of this was central apnea with Cheyne-Stokes respirations.  She had a BiPAP titration and pressors were increased to BiPAP 15/11 with a backup rate of 10.  She did well on 14/10 and 15/11 backup rate 10.  She is trying to use BiPAP but has had difficulties with leakage.  I  asked our sleep lab manager, Vanessa Short, to help fit her for a different mask.  She was fitted with the Airfit F40 standard headgear, medium cushion.  She will try this to see if she gets a better seal and better control of her apnea.  BiPAP titration 06/19/2023: The patient was initiated on CPAP +5 cm and inreased to 7 cm.  Due to continued central and obstructive events, BiPAP was initated and titrated to 13/9 cm H2O pressure.   Due to continued central events with periodic breathing noted, the patient was placed on BiPAP ST and titrated to +15/11 cm H2O pressure with BUR 12.   On this setting, the  AHI was 0.  However, had centrals on Download.    Download today shows AHI= 17 with BIPAP ST 19/15 BUR 12.    No Cheyne  Stokes on repeat titration in 2025 EPWORTH SLEEPINESS SCALE  On a scale of 0 - 3 what is the chance of dozing:  Sitting and Reading:   3 Watching TV:    3 Sitting inactive in a public place: 1 Passenger in car for one hour: 3 Lying down to rest in the afternoon: 3 Sitting and talking to someone: 0 Sitting quietly after lunch:  2 In a car, stopped in traffic:  N/a  Total (out of 24):   15    MS: She was diagnosed with MS 1989.  Unfortunately, she has had progressive difficulties with weakness in both legs and in the left arm more than the right arm..  She has not had any recent exacerbation.  Due to her low lymphocyte count her Vumerity is currently being held.    MS History:  She presented with a Lhermite's syndrome in 1988 or 1989.   MRIs of the head and neck were performed. They showed lesions consistent with the diagnosis of multiple sclerosis. Lumbar puncture was not necessary. Over the next 15 years, she would get some exacerbations and have courses of steroids. However, a disease modifying therapy was not prescribed. By 2004, she had difficulty with her gait and would often have to lean on somebody for support.  She started Copaxone that year and stayed on it for about one half years. She stopped due to a lot of itching. She then switched to Rebif. While on Copaxone Rebif she continues to have exacerbations and further difficulties with her gait. About 2012 she switched to Aubagio . She tolerated Aubagio  well but progressed.  Tysabri was started in February 2019 after relapse.  Unfortunately she converted from JCV negative to positive.  She felt she did well on Tysabri with some slowing of her progression as well as improved fatigue.  Due to the elevated JCV, she switched to Vumerity in 2021.  MRI 10/11/2016 showed:  Confluent white matter disease throughout the cerebral hemispheres consistent with chronic multiple sclerosis. Several foci show restricted diffusion, but no contrast  enhancement.  No new lesions compared to January 2018.  MRI cervical spine 08/16/2016 showed:  C5 myelomalacia, additional spinal cord plaques consistent with chronic demyelination, relatively similar though limited assessment due to patient motion.  MRI brain 08/16/2016 showed:  Numerous foci of reduced diffusion most consistent with hyperacute/active demyelination, less likely infection/septic emboli. Severe chronic demyelination including supra and infratentorial white matter, cortex and deep gray nuclei lesions.  Moderate parenchymal brain volume loss for age.  REVIEW OF SYSTEMS:  Constitutional: No fevers, chills, sweats, or change in appetite.  Has fatigue and poor slep Eyes: see above.   No  eye pain Ear, nose and throat: No hearing loss, ear pain, nasal congestion, sore throat Cardiovascular: No chest pain, palpitations Respiratory:  No shortness of breath at rest or with exertion.   No wheezes GastrointestinaI: Has UC - doing well.   No nausea, vomiting, diarrhea, abdominal pain, fecal incontinence Genitourinary:  see above Musculoskeletal:  No neck pain, back pain Integumentary: No rash, pruritus, skin lesions Neurological: as above Psychiatric: as above. Endocrine: No palpitations, diaphoresis, change in appetite, change in weigh or increased thirst Hematologic/Lymphatic:  No anemia, purpura, petechiae. Allergic/Immunologic: No itchy/runny eyes, nasal congestion, recent allergic reactions, rashes  ALLERGIES: Allergies  Allergen Reactions   Penicillins Dermatitis, Rash and Other (See Comments)    Cannot take pure penicillin   Nitrofurantoin Other (See Comments)    Syncope    Oxycodone -Acetaminophen  Other (See Comments)    Hallucinations   Risperidone And Paliperidone Other (See Comments)    Became very lethargic    HOME MEDICATIONS: Outpatient Medications Prior to Visit  Medication Sig Dispense Refill   acyclovir  (ZOVIRAX ) 400 MG tablet Take 400 mg by mouth in the  morning.  0   b complex vitamins tablet Take 1 tablet by mouth daily.      Cholecalciferol  (VITAMIN D ) 50 MCG (2000 UT) CAPS Take 2,000 Units by mouth in the morning.     dantrolene  (DANTRIUM ) 50 MG capsule Take 1 capsule (50 mg total) by mouth 3 (three) times daily as needed. (Patient taking differently: Take 50 mg by mouth See admin instructions. Take 50 mg by mouth with breakfast, at 12 NOON, and at 4 PM) 270 capsule 3   famotidine  (PEPCID ) 40 MG tablet Take 40 mg by mouth See admin instructions. Take 40 mg by mouth in the morning and at 4 PM     FLUoxetine  (PROZAC ) 40 MG capsule Take 40 mg by mouth in the morning and at bedtime.     folic acid  (FOLVITE ) 400 MCG tablet Take 400 mcg by mouth in the morning.     furosemide  (LASIX ) 40 MG tablet Take 0.5 tablets (20 mg total) by mouth 2 (two) times daily. (Patient taking differently: Take 20 mg by mouth See admin instructions. Take 20 mg by mouth in the morning and with lunch)     labetalol  (NORMODYNE ) 100 MG tablet TAKE 1/2 TABLET BY MOUTH DAILY (Patient taking differently: Take 50 mg by mouth in the morning.) 45 tablet 2   levothyroxine  (SYNTHROID ) 75 MCG tablet Take 75 mcg by mouth at bedtime.     lidocaine -prilocaine  (EMLA ) cream Apply one inch before port-a cath access prn (Patient taking differently: Apply 1 Application topically daily as needed (port access).) 30 g 3   mesalamine  (APRISO ) 0.375 g 24 hr capsule Take 375 mg by mouth 4 (four) times daily.     Multiple Vitamin (MULTIVITAMIN) tablet Take 1 tablet by mouth daily with breakfast.     OXcarbazepine  (TRILEPTAL ) 150 MG tablet Take 1 tablet (150 mg total) by mouth 3 (three) times daily. (Patient taking differently: Take 150 mg by mouth See admin instructions. Take 150 mg by mouth with breakfast, at 12 NOON, and at 4 PM) 270 tablet 3   potassium chloride  (KLOR-CON ) 10 MEQ tablet Take 2 tablets (20 mEq total) by mouth daily. Take 4 pills daily for 3 days then continue taking as before (Patient  taking differently: Take 10 mEq by mouth in the morning.) 30 tablet 0   QUEtiapine  (SEROQUEL ) 25 MG tablet One po qHS and one po every day  prn (Patient taking differently: Take 25 mg by mouth at bedtime.) 60 tablet 11   sodium chloride  (OCEAN) 0.65 % SOLN nasal spray Place 1 spray into both nostrils as needed for congestion.  0   tolterodine  (DETROL ) 2 MG tablet Take 2 mg by mouth See admin instructions. Take 2 mg by mouth in the morning and at 4 PM  1   baclofen  (LIORESAL ) 10 MG tablet Take 1 tablet (10 mg total) by mouth 4 (four) times daily. 120 tablet 11   cefadroxil  (DURICEF) 500 MG capsule Take 1 capsule (500 mg total) by mouth 2 (two) times daily. (Patient not taking: Reported on 04/11/2024) 2 capsule 0   ferrous sulfate  325 (65 FE) MG tablet Take 325 mg by mouth daily with breakfast.     rosuvastatin  (CRESTOR ) 40 MG tablet Take 40 mg by mouth at bedtime.     SODIUM BICARBONATE PO Take by mouth. 1300 mg 2 pills in the morning and 1 in afternoon     Teriflunomide  14 MG TABS Take 1 tablet (14 mg total) by mouth daily. (Patient taking differently: Take 14 mg by mouth in the morning.) 90 tablet 0   cholecalciferol  (VITAMIN D ) 1000 UNITS tablet Take 1,000 Units by mouth daily.     FLUoxetine  (PROZAC ) 20 MG capsule Take 20 mg by mouth 3 (three) times daily.     guaiFENesin  (ROBITUSSIN) 100 MG/5ML liquid Take 5 mLs by mouth every 4 (four) hours as needed for cough or to loosen phlegm. (Patient not taking: Reported on 04/08/2024) 120 mL 0   levothyroxine  (SYNTHROID ) 25 MCG tablet Take 25 mcg by mouth every evening. (Patient not taking: Reported on 04/08/2024)     lubiprostone  (AMITIZA ) 8 MCG capsule Take 8 mcg by mouth 2 (two) times daily with a meal. (Patient not taking: Reported on 04/08/2024)     pantoprazole  (PROTONIX ) 40 MG tablet Take 1 tablet (40 mg total) by mouth daily. (Patient not taking: Reported on 04/08/2024) 30 tablet 0   No facility-administered medications prior to visit.    PAST  MEDICAL HISTORY: Past Medical History:  Diagnosis Date   Headache    Herpes genitalis in women    History of kidney stones    Hypertension    Kidney stones    Movement disorder    Multiple sclerosis    Neuropathy    Oral herpes    Ulcerative colitis (HCC)    Vision abnormalities     PAST SURGICAL HISTORY: Past Surgical History:  Procedure Laterality Date   BIOPSY  08/25/2023   Procedure: BIOPSY;  Surgeon: Saintclair Jasper, MD;  Location: [facility];  Service: Gastroenterology;;   ENDOMETRIAL ABLATION  10/10/2017   ESOPHAGOGASTRODUODENOSCOPY (EGD) WITH PROPOFOL  Left 08/25/2023   Procedure: ESOPHAGOGASTRODUODENOSCOPY (EGD) WITH PROPOFOL ;  Surgeon: Saintclair Jasper, MD;  Location: [facility];  Service: Gastroenterology;  Laterality: Left;   KIDNEY STONE SURGERY     PORTACATH PLACEMENT N/A 10/16/2017   Procedure: ULTRA SOUND GUIDED INSERTION PORT-A-CATH ERAS PATHWAY;  Surgeon: Kinsinger, Herlene Righter, MD;  Location: [facility];  Service: General;  Laterality: N/A;    FAMILY HISTORY: Family History  Problem Relation Age of Onset   Dementia Mother    Hypertension Father    Hyperlipidemia Father    Diabetes Father    Heart disease Father    Breast cancer Neg Hx     SOCIAL HISTORY:  Social History   Socioeconomic History   Marital status: Married    Spouse name: Zandrea Kenealy  Number of children: Not on file   Years of education: Not on file   Highest education level: Not on file  Occupational History   Not on file  Tobacco Use   Smoking status: Never   Smokeless tobacco: Never  Vaping Use   Vaping status: Never Used  Substance and Sexual Activity   Alcohol use: Yes    Comment: rare glass of wine   Drug use: No   Sexual activity: Not on file  Other Topics Concern   Not on file  Social History Narrative   ** Merged History Encounter **       Social Drivers of Health   Financial Resource Strain: Not on file  Food Insecurity: Low Risk  (04/28/2024)   Received from [facility]   Hunger Vital Sign    Within the past 12 months, you worried that your food would run out before you got money to buy more: Never true    Within the past 12 months, the food you bought just didn't last and you didn't have money to get more. : Never true  Transportation Needs: No Transportation Needs (04/28/2024)   Received from [facility]    In the past 12 months, has lack of reliable transportation kept you from medical appointments, meetings, work or from getting things needed for daily living? : No  Physical Activity: Not on file  Stress: No Stress Concern Present (09/24/2020)   Received from [name] Occupational [facility] - Occupational Stress Questionnaire    Feeling of Stress : Only a little  Social Connections: Moderately Integrated (08/22/2023)   Social Connection and Isolation Panel    Frequency of Communication with Friends and Family: Twice a week    Frequency of Social Gatherings with Friends and Family: Once a week    Attends Religious Services: 1 to 4 times per year    Active Member of [employer] or Organizations: No    Attends Banker Meetings: Never    Marital Status: Married  Catering Manager Violence: Patient Unable To Answer (04/15/2024)   Humiliation, Afraid, Rape, and Kick questionnaire    Fear of Current or Ex-Partner: Patient unable to answer    Emotionally Abused: Patient unable to answer    Physically Abused: Patient unable to answer    Sexually Abused: Patient unable to answer     PHYSICAL EXAM  Vitals:   04/08/24 1135  BP: 115/72  Pulse: 62  SpO2: 95%  Weight: 180 lb (81.6 kg)  Height: 5' 2 (1.575 m)     Weight = 175 pounds.   Patient is right handed.     Body mass index is 32.92 kg/m.   General: The patient is well-developed and well-nourished and in no acute distress.  She is in a power wheelchair    Skin/extremities: Extremities are without rash.  She has edema at the  ankles  Neurologic Exam  Mental status: The patient is alert and oriented.  Focus and attention are reduced.  Speech is normal.  Cranial nerves: Extraocular movements are full.   Facial strength and sensation is normal.  Trapezius strength is normal.  There is no dysarthria.  No obvious hearing deficits are noted.  Motor:  Muscle bulk is normal .  Muscle tone is mildly increased in the legs, right greater than left.  She has increased muscle tone in the arms, left >>right   She has 0/5 strength in the legs.. Strength is  1 to 2-/5 in the left arm and 2+ to 3/5 in the right arm proximal arm and 4/5 finger and wrist flexion.  Able to touch face but without much strength.  She is unable to write.  She is able to operate her joystick on the right..   Sensory: She appears to have symmetric vibration sensation in the arms and reduced vibration sensation at the knees.  Gait and station: She is wheelchair-bound and not able to stand or walk . Reflexes: She has increased reflexes in her legs.    DIAGNOSTIC DATA (LABS, IMAGING, TESTING) - I reviewed patient records, labs, notes, testing and imaging myself where available.   Recent Results (from the past 2160 hours)  Lipid Panel     Status: None   Collection Time: 04/07/24  1:46 PM  Result Value Ref Range   Cholesterol, Total 138 100 - 199 mg/dL   Triglycerides 94 0 - 149 mg/dL   HDL 62 >60 mg/dL   VLDL Cholesterol Cal 17 5 - 40 mg/dL   LDL Chol Calc (NIH) 59 0 - 99 mg/dL   Chol/HDL Ratio 2.2 0.0 - 4.4 ratio    Comment:                                   T. Chol/HDL Ratio                                             Men  Women                               1/2 Avg.Risk  3.4    3.3                                   Avg.Risk  5.0    4.4                                2X Avg.Risk  9.6    7.1                                3X Avg.Risk 23.4   11.0   TSH     Status: None   Collection Time: 04/07/24  1:46 PM  Result Value Ref Range   TSH 4.150  0.450 - 4.500 uIU/mL  CBC with Differential/Platelet     Status: Abnormal   Collection Time: 04/07/24  1:46 PM  Result Value Ref Range   WBC 7.1 3.4 - 10.8 x10E3/uL    Comment: White count and platelets may be decreased due to fibrin strands in the sample submitted.    RBC 2.23 (LL) 3.77 - 5.28 x10E6/uL    Comment: Basophilic Stippling. Ovalocytes present. Polychromasia present    Hemoglobin 7.3 (L) 11.1 - 15.9 g/dL   Hematocrit 77.3 (L) 65.9 - 46.6 %   MCV 101 (H) 79 - 97 fL   MCH 32.7 26.6 - 33.0 pg   MCHC 32.3 31.5 - 35.7 g/dL   RDW 87.2 88.2 - 84.5 %   Platelets 77 (LL) 150 -  450 x10E3/uL   Neutrophils 77 Not Estab. %   Lymphs 7 Not Estab. %   Monocytes 11 Not Estab. %   Eos 3 Not Estab. %   Basos 1 Not Estab. %   Neutrophils Absolute 5.5 1.4 - 7.0 x10E3/uL   Lymphocytes Absolute 0.5 (L) 0.7 - 3.1 x10E3/uL   Monocytes Absolute 0.7 0.1 - 0.9 x10E3/uL   EOS (ABSOLUTE) 0.2 0.0 - 0.4 x10E3/uL   Basophils Absolute 0.1 0.0 - 0.2 x10E3/uL   Immature Granulocytes 1 Not Estab. %   Immature Grans (Abs) 0.1 0.0 - 0.1 x10E3/uL   NRBC 1 (H) 0 - 0 %   Hematology Comments: Note:     Comment: CBC met reflex criteria for review of peripheral smear by medical laboratory professional. Automated results were confirmed by smear review.   CBC     Status: Abnormal   Collection Time: 04/11/24  4:15 PM  Result Value Ref Range   WBC 6.9 4.0 - 10.5 K/uL   RBC 2.68 (L) 3.87 - 5.11 MIL/uL   Hemoglobin 8.6 (L) 12.0 - 15.0 g/dL   HCT 70.6 (L) 63.9 - 53.9 %   MCV 109.3 (H) 80.0 - 100.0 fL   MCH 32.1 26.0 - 34.0 pg   MCHC 29.4 (L) 30.0 - 36.0 g/dL   RDW 84.0 (H) 88.4 - 84.4 %   Platelets 97 (L) 150 - 400 K/uL    Comment: SPECIMEN CHECKED FOR CLOTS PLATELET COUNT CONFIRMED BY SMEAR REPEATED TO VERIFY Immature Platelet Fraction may be clinically indicated, consider ordering this additional test [id number]    nRBC 0.0 0.0 - 0.2 %    Comment: Performed at [facility], [address], [place], [place] [address]  Comprehensive metabolic panel     Status: Abnormal   Collection Time: 04/11/24  4:15 PM  Result Value Ref Range   Sodium 145 135 - 145 mmol/L   Potassium 5.1 3.5 - 5.1 mmol/L   Chloride 110 98 - 111 mmol/L   CO2 21 (L) 22 - 32 mmol/L   Glucose, Bld 86 70 - 99 mg/dL    Comment: Glucose reference range applies only to samples taken after fasting for at least 8 hours.   BUN 49 (H) 6 - 20 mg/dL   Creatinine, Ser 7.94 (H) 0.44 - 1.00 mg/dL   Calcium  9.4 8.9 - 10.3 mg/dL   Total Protein 6.1 (L) 6.5 - 8.1 g/dL   Albumin 3.8 3.5 - 5.0 g/dL   AST 22 15 - 41 U/L   ALT 24 0 - 44 U/L   Alkaline Phosphatase 162 (H) 38 - 126 U/L   Total Bilirubin 0.3 0.0 - 1.2 mg/dL   GFR, Estimated 27 (L) >60 mL/min    Comment: (NOTE) Calculated using the CKD-EPI Creatinine Equation (2021)    Anion gap 15 5 - 15    Comment: Performed at [facility], [address], [place], [place] [address]  Type and screen Ordered by PROVIDER DEFAULT     Status: None   Collection Time: 04/11/24  5:20 PM  Result Value Ref Range   ABO/RH(D) A POS    Antibody Screen NEG    Sample Expiration      04/14/2024,2359 Performed at [facility], [address], [place], [place] [address]   Urinalysis, Routine w reflex microscopic -Urine, Clean Catch     Status: Abnormal   Collection Time: 04/11/24  6:35 PM  Result Value Ref Range  Color, Urine YELLOW YELLOW   APPearance CLOUDY (A) CLEAR   Specific Gravity, Urine 1.011 1.005 - 1.030   pH 7.0 5.0 - 8.0   Glucose, UA NEGATIVE NEGATIVE mg/dL   Hgb urine dipstick LARGE (A) NEGATIVE   Bilirubin Urine NEGATIVE NEGATIVE   Ketones, ur NEGATIVE NEGATIVE mg/dL   Protein, ur 30 (A) NEGATIVE mg/dL   Nitrite NEGATIVE NEGATIVE   Leukocytes,Ua LARGE (A) NEGATIVE   RBC / HPF 21-50 0 - 5 RBC/hpf   WBC, UA >50 0 - 5 WBC/hpf   Bacteria, UA RARE (A) NONE SEEN   Squamous Epithelial / HPF 6-10 0 - 5 /HPF   Mucus  PRESENT     Comment: Performed at [facility], [address] [place], [place] [address]  Urine Culture (for pregnant, neutropenic or urologic patients or patients with an indwelling urinary catheter)     Status: None   Collection Time: 04/11/24 10:05 PM   Specimen: Urine, Clean Catch  Result Value Ref Range   Specimen Description      URINE, CLEAN CATCH Performed at [facility], [address], [place], [place] [address]    Special Requests      Immunocompromised Performed at [facility], [address] [place], [place] [address]    Culture      NO GROWTH Performed at [facility], [address] [place], [place] [address]    Report Status 04/13/2024 FINAL   Sodium, urine, random     Status: None   Collection Time: 04/11/24 10:05 PM  Result Value Ref Range   Sodium, Ur 107 mmol/L    Comment: Performed at [facility], [address] [place], [place] [address]  Creatinine, urine, random     Status: None   Collection Time: 04/11/24 10:05 PM  Result Value Ref Range   Creatinine, Urine 31 mg/dL    Comment: Performed at [facility], [address] [place], [place] [address]  CBG monitoring, ED     Status: Abnormal   Collection Time: 04/12/24 12:41 AM  Result Value Ref Range   Glucose-Capillary 106 (H) 70 - 99 mg/dL    Comment: Glucose reference range applies only to samples taken after fasting for at least 8 hours.  CBC     Status: Abnormal   Collection Time: 04/12/24  5:27 AM  Result Value Ref Range   WBC 6.9 4.0 - 10.5 K/uL   RBC 2.59 (L) 3.87 - 5.11 MIL/uL   Hemoglobin 8.1 (L) 12.0 - 15.0 g/dL   HCT 71.7 (L) 63.9 - 53.9 %   MCV 108.9 (H) 80.0 - 100.0 fL   MCH 31.3 26.0 - 34.0 pg   MCHC 28.7 (L) 30.0 - 36.0 g/dL   RDW 84.2 (H) 88.4 - 84.4 %   Platelets 108 (L) 150 - 400 K/uL   nRBC 0.0 0.0 - 0.2 %    Comment: Performed at [facility], [address], [place], [place] [address]  Basic metabolic panel     Status: Abnormal   Collection Time: 04/12/24  5:27 AM  Result Value Ref Range   Sodium 147 (H) 135 - 145 mmol/L   Potassium 5.2 (H) 3.5 - 5.1 mmol/L   Chloride 113 (H) 98 - 111 mmol/L   CO2 22 22 - 32 mmol/L   Glucose, Bld 99 70 - 99 mg/dL    Comment: Glucose reference range applies only to samples taken  after fasting for at least 8 hours.   BUN 44 (H) 6 - 20 mg/dL   Creatinine, Ser 7.99 (H) 0.44 - 1.00 mg/dL   Calcium  9.1 8.9 - 10.3 mg/dL   GFR, Estimated 28 (L) >60 mL/min    Comment: (NOTE) Calculated using the CKD-EPI Creatinine Equation (2021)    Anion gap 12 5 - 15    Comment: Performed at [facility], [address], [place], [place] [address]  CBG monitoring, ED     Status: Abnormal   Collection Time: 04/12/24  6:18 AM  Result Value Ref Range   Glucose-Capillary 101 (H) 70 - 99 mg/dL    Comment: Glucose reference range applies only to samples taken after fasting for at least 8 hours.  MRSA Next Gen by PCR, Nasal     Status: None   Collection Time: 04/12/24  2:15 PM   Specimen: Nasal Mucosa; Nasal Swab  Result Value Ref Range   MRSA by PCR Next Gen NOT DETECTED NOT DETECTED    Comment: (NOTE) The GeneXpert MRSA Assay (FDA approved for NASAL specimens only), is one component of a comprehensive MRSA colonization surveillance program. It is not intended to diagnose MRSA infection nor to guide or monitor treatment for MRSA infections. Test performance is not FDA approved in patients less than 14 years old. Performed at [facility], [address] [place], [place] [address]   Glucose, capillary     Status: None   Collection Time: 04/12/24  5:25 PM  Result Value Ref Range   Glucose-Capillary 97 70 - 99 mg/dL    Comment: Glucose reference range applies only to samples taken after fasting for at least 8 hours.  Glucose, capillary     Status: None   Collection Time:  04/12/24 11:27 PM  Result Value Ref Range   Glucose-Capillary 91 70 - 99 mg/dL    Comment: Glucose reference range applies only to samples taken after fasting for at least 8 hours.  Glucose, capillary     Status: Abnormal   Collection Time: 04/13/24  5:37 AM  Result Value Ref Range   Glucose-Capillary 42 (LL) 70 - 99 mg/dL    Comment: Glucose reference range applies only to samples taken after fasting for at least 8 hours.  Glucose, capillary     Status: None   Collection Time: 04/13/24  5:42 AM  Result Value Ref Range   Glucose-Capillary 83 70 - 99 mg/dL    Comment: Glucose reference range applies only to samples taken after fasting for at least 8 hours.  T4, free     Status: Abnormal   Collection Time: 04/13/24  6:40 AM  Result Value Ref Range   Free T4 0.60 (L) 0.61 - 1.12 ng/dL    Comment: (NOTE) Biotin ingestion may interfere with free T4 tests. If the results are inconsistent with the TSH level, previous test results, or the clinical presentation, then consider biotin interference. If needed, order repeat testing after stopping biotin. Performed at [facility], [address], [place], [place] [address]   TSH     Status: None   Collection Time: 04/13/24  6:40 AM  Result Value Ref Range   TSH 4.040 0.350 - 4.500 uIU/mL    Comment: Performed at [facility], [address], [place], [place] [address]  CBC     Status: Abnormal   Collection Time: 04/13/24  6:40 AM  Result Value Ref Range   WBC 7.4 4.0 -  10.5 K/uL   RBC 2.52 (L) 3.87 - 5.11 MIL/uL   Hemoglobin 7.9 (L) 12.0 - 15.0 g/dL   HCT 72.4 (L) 63.9 - 53.9 %   MCV 109.1 (H) 80.0 - 100.0 fL   MCH 31.3 26.0 - 34.0 pg   MCHC 28.7 (L) 30.0 - 36.0 g/dL   RDW 84.3 (H) 88.4 - 84.4 %   Platelets 96 (L) 150 - 400 K/uL    Comment: SPECIMEN CHECKED FOR CLOTS PLATELET COUNT CONFIRMED BY SMEAR REPEATED TO VERIFY Immature Platelet Fraction may be clinically indicated, consider ordering this additional  test [id number]    nRBC 0.0 0.0 - 0.2 %    Comment: Performed at [facility], [address], [place], [place] [address]  Basic metabolic panel with GFR     Status: Abnormal   Collection Time: 04/13/24  6:40 AM  Result Value Ref Range   Sodium 147 (H) 135 - 145 mmol/L   Potassium 4.6 3.5 - 5.1 mmol/L   Chloride 114 (H) 98 - 111 mmol/L   CO2 20 (L) 22 - 32 mmol/L   Glucose, Bld 85 70 - 99 mg/dL    Comment: Glucose reference range applies only to samples taken after fasting for at least 8 hours.   BUN 37 (H) 6 - 20 mg/dL   Creatinine, Ser 8.14 (H) 0.44 - 1.00 mg/dL   Calcium  9.1 8.9 - 10.3 mg/dL   GFR, Estimated 31 (L) >60 mL/min    Comment: (NOTE) Calculated using the CKD-EPI Creatinine Equation (2021)    Anion gap 13 5 - 15    Comment: Performed at [facility], [address], [place], [place] [address]  Glucose, capillary     Status: Abnormal   Collection Time: 04/13/24 12:06 PM  Result Value Ref Range   Glucose-Capillary 180 (H) 70 - 99 mg/dL    Comment: Glucose reference range applies only to samples taken after fasting for at least 8 hours.  Glucose, capillary     Status: Abnormal   Collection Time: 04/13/24  5:05 PM  Result Value Ref Range   Glucose-Capillary 113 (H) 70 - 99 mg/dL    Comment: Glucose reference range applies only to samples taken after fasting for at least 8 hours.  Glucose, capillary     Status: Abnormal   Collection Time: 04/14/24 12:02 AM  Result Value Ref Range   Glucose-Capillary 130 (H) 70 - 99 mg/dL    Comment: Glucose reference range applies only to samples taken after fasting for at least 8 hours.  Glucose, capillary     Status: Abnormal   Collection Time: 04/14/24  5:22 AM  Result Value Ref Range   Glucose-Capillary 124 (H) 70 - 99 mg/dL    Comment: Glucose reference range applies only to samples taken after fasting for at least 8 hours.  Basic metabolic panel with GFR     Status: Abnormal   Collection  Time: 04/14/24  6:25 AM  Result Value Ref Range   Sodium 143 135 - 145 mmol/L   Potassium 4.6 3.5 - 5.1 mmol/L   Chloride 112 (H) 98 - 111 mmol/L   CO2 19 (L) 22 - 32 mmol/L   Glucose, Bld 112 (H) 70 - 99 mg/dL    Comment: Glucose reference range applies only to samples taken after fasting for at least 8 hours.   BUN 31 (H) 6 - 20 mg/dL   Creatinine, Ser 8.39 (H) 0.44 - 1.00 mg/dL   Calcium  9.7  8.9 - 10.3 mg/dL   GFR, Estimated 37 (L) >60 mL/min    Comment: (NOTE) Calculated using the CKD-EPI Creatinine Equation (2021)    Anion gap 12 5 - 15    Comment: Performed at [facility], [address], [place], [place] [address]  Blood Culture (routine x 2)     Status: None   Collection Time: 04/15/24  2:23 AM   Specimen: BLOOD  Result Value Ref Range   Specimen Description BLOOD SITE NOT SPECIFIED    Special Requests      BOTTLES DRAWN AEROBIC AND ANAEROBIC Blood Culture adequate volume   Culture      NO GROWTH 5 DAYS Performed at [facility], [address], [place], [place] [address]    Report Status 04/20/2024 FINAL   Urinalysis, w/ Reflex to Culture (Infection Suspected) -Urine, Clean Catch     Status: Abnormal   Collection Time: 04/15/24  3:17 AM  Result Value Ref Range   Specimen Source URINE, CLEAN CATCH    Color, Urine YELLOW YELLOW   APPearance CLOUDY (A) CLEAR   Specific Gravity, Urine 1.014 1.005 - 1.030   pH 6.0 5.0 - 8.0   Glucose, UA 50 (A) NEGATIVE mg/dL   Hgb urine dipstick LARGE (A) NEGATIVE   Bilirubin Urine NEGATIVE NEGATIVE   Ketones, ur 5 (A) NEGATIVE mg/dL   Protein, ur >=699 (A) NEGATIVE mg/dL   Nitrite NEGATIVE NEGATIVE   Leukocytes,Ua MODERATE (A) NEGATIVE   RBC / HPF >50 0 - 5 RBC/hpf   WBC, UA >50 0 - 5 WBC/hpf    Comment:        Reflex urine culture not performed if WBC <=10, OR if Squamous epithelial cells >5. If Squamous epithelial cells >5 suggest recollection.    Bacteria, UA RARE (A) NONE SEEN   Squamous  Epithelial / HPF 0-5 0 - 5 /HPF   Mucus PRESENT    Non Squamous Epithelial 0-5 (A) NONE SEEN    Comment: Performed at [facility], [address], [place], [place] [address]  Urine Culture     Status: None   Collection Time: 04/15/24  3:17 AM   Specimen: Urine, Random  Result Value Ref Range   Specimen Description URINE, RANDOM    Special Requests NONE Reflexed from [phone]    Culture      NO GROWTH Performed at [facility], [address], [place], [place] [address]    Report Status 04/16/2024 FINAL   CBC with Differential     Status: Abnormal   Collection Time: 04/15/24  4:00 AM  Result Value Ref Range   WBC 12.5 (H) 4.0 - 10.5 K/uL   RBC 2.72 (L) 3.87 - 5.11 MIL/uL   Hemoglobin 8.8 (L) 12.0 - 15.0 g/dL   HCT 70.7 (L) 63.9 - 53.9 %   MCV 107.4 (H) 80.0 - 100.0 fL   MCH 32.4 26.0 - 34.0 pg   MCHC 30.1 30.0 - 36.0 g/dL   RDW 84.2 (H) 88.4 - 84.4 %   Platelets 101 (L) 150 - 400 K/uL   nRBC 0.0 0.0 - 0.2 %   Neutrophils Relative % 77 %   Neutro Abs 9.7 (H) 1.7 - 7.7 K/uL   Lymphocytes Relative 5 %   Lymphs Abs 0.7 0.7 - 4.0 K/uL   Monocytes Relative 14 %   Monocytes Absolute 1.7 (H) 0.1 - 1.0 K/uL   Eosinophils Relative 2 %   Eosinophils Absolute 0.3  0.0 - 0.5 K/uL   Basophils Relative 1 %   Basophils Absolute 0.1 0.0 - 0.1 K/uL   Immature Granulocytes 1 %   Abs Immature Granulocytes 0.09 (H) 0.00 - 0.07 K/uL    Comment: Performed at [facility], [address], [place], [place] [address]  Comprehensive metabolic panel     Status: Abnormal   Collection Time: 04/15/24  4:00 AM  Result Value Ref Range   Sodium 146 (H) 135 - 145 mmol/L   Potassium 4.3 3.5 - 5.1 mmol/L   Chloride 115 (H) 98 - 111 mmol/L   CO2 19 (L) 22 - 32 mmol/L   Glucose, Bld 120 (H) 70 - 99 mg/dL    Comment: Glucose reference range applies only to samples taken after fasting for at least 8 hours.   BUN 31 (H) 6 - 20 mg/dL   Creatinine, Ser 8.00 (H) 0.44 - 1.00 mg/dL   Calcium  9.2  8.9 - 10.3 mg/dL   Total Protein 6.0 (L) 6.5 - 8.1 g/dL   Albumin 3.3 (L) 3.5 - 5.0 g/dL   AST 27 15 - 41 U/L   ALT 25 0 - 44 U/L   Alkaline Phosphatase 113 38 - 126 U/L   Total Bilirubin 0.7 0.0 - 1.2 mg/dL   GFR, Estimated 28 (L) >60 mL/min    Comment: (NOTE) Calculated using the CKD-EPI Creatinine Equation (2021)    Anion gap 12 5 - 15    Comment: Performed at  [facility], [address] [place], [place] [address]  Protime-INR     Status: None   Collection Time: 04/15/24  4:00 AM  Result Value Ref Range   Prothrombin Time 15.0 11.4 - 15.2 seconds   INR 1.1 0.8 - 1.2    Comment: (NOTE) INR goal varies based on device and disease states. Performed at [facility], [address] [place], [place] [address]   I-Stat Lactic Acid, ED     Status: None   Collection Time: 04/15/24  4:21 AM  Result Value Ref Range   Lactic Acid, Venous 0.7 0.5 - 1.9 mmol/L  HIV Antibody (routine testing w rflx)     Status: None   Collection Time: 04/15/24 10:15 AM  Result Value Ref Range   HIV Screen 4th Generation wRfx Non Reactive Non Reactive    Comment: Performed at [facility], [address], [place], [place] [address]  Blood Culture (routine x 2)     Status: None   Collection Time: 04/15/24  2:48 PM   Specimen: BLOOD RIGHT HAND  Result Value Ref Range   Specimen Description BLOOD RIGHT HAND    Special Requests      BOTTLES DRAWN AEROBIC AND ANAEROBIC Blood Culture adequate volume   Culture      NO GROWTH 5 DAYS Performed at [facility], [address], [place], [place] [address]    Report Status 04/20/2024 FINAL   Procalcitonin     Status: None   Collection Time: 04/15/24  2:48 PM  Result Value Ref Range   Procalcitonin 0.23 ng/mL    Comment:        Interpretation: PCT (Procalcitonin) <= 0.5 ng/mL: Systemic infection (sepsis) is not likely. Local bacterial infection is possible. (NOTE)       Sepsis PCT Algorithm           Lower Respiratory Tract  Infection PCT Algorithm    ----------------------------     ----------------------------         PCT < 0.25 ng/mL                PCT < 0.10 ng/mL          Strongly encourage             Strongly discourage   discontinuation of antibiotics    initiation of antibiotics    ----------------------------     -----------------------------       PCT 0.25 - 0.50 ng/mL            PCT 0.10 - 0.25 ng/mL               OR       >80% decrease in PCT            Discourage initiation of                                            antibiotics      Encourage discontinuation           of antibiotics    ----------------------------     -----------------------------         PCT >= 0.50 ng/mL              PCT 0.26 - 0.50 ng/mL               AND        <80% decrease in PCT             Encourage initiation of                                             antibiotics       Encourage continuation           of antibiotics    ----------------------------     -----------------------------        PCT >= 0.50 ng/mL                  PCT > 0.50 ng/mL               AND         increase in PCT                  Strongly encourage                                      initiation of antibiotics    Strongly encourage escalation           of antibiotics                                     -----------------------------                                           PCT <= 0.25 ng/mL  OR                                        > 80% decrease in PCT                                      Discontinue / Do not initiate                                             antibiotics  Performed at [facility], [address], [place], [place] [address]   CBC     Status: Abnormal   Collection Time: 04/16/24  6:56 AM  Result Value Ref Range   WBC 7.7 4.0 - 10.5 K/uL   RBC 2.12 (L) 3.87 - 5.11 MIL/uL   Hemoglobin 7.1 (L) 12.0 - 15.0 g/dL   HCT 76.6 (L) 63.9  - 46.0 %   MCV 109.9 (H) 80.0 - 100.0 fL   MCH 33.5 26.0 - 34.0 pg   MCHC 30.5 30.0 - 36.0 g/dL   RDW 84.1 (H) 88.4 - 84.4 %   Platelets 85 (L) 150 - 400 K/uL    Comment: REPEATED TO VERIFY Immature Platelet Fraction may be clinically indicated, consider ordering this additional test [id number]    nRBC 0.0 0.0 - 0.2 %    Comment: Performed at [facility], [address], [place], [place] [address]  Basic metabolic panel with GFR     Status: Abnormal   Collection Time: 04/16/24  6:56 AM  Result Value Ref Range   Sodium 145 135 - 145 mmol/L   Potassium 4.1 3.5 - 5.1 mmol/L   Chloride 116 (H) 98 - 111 mmol/L   CO2 19 (L) 22 - 32 mmol/L   Glucose, Bld 91 70 - 99 mg/dL    Comment: Glucose reference range applies only to samples taken after fasting for at least 8 hours.   BUN 30 (H) 6 - 20 mg/dL   Creatinine, Ser 8.01 (H) 0.44 - 1.00 mg/dL   Calcium  8.2 (L) 8.9 - 10.3 mg/dL   GFR, Estimated 29 (L) >60 mL/min    Comment: (NOTE) Calculated using the CKD-EPI Creatinine Equation (2021)    Anion gap 10 5 - 15    Comment: Performed at [facility], [address] [place], [place] [address]  CBC     Status: Abnormal   Collection Time: 04/17/24 12:28 AM  Result Value Ref Range   WBC 7.4 4.0 - 10.5 K/uL   RBC 1.89 (L) 3.87 - 5.11 MIL/uL   Hemoglobin 6.2 (LL) 12.0 - 15.0 g/dL    Comment: REPEATED TO VERIFY This critical result has been called to M. Rosas, RN by Miguel Reveal on 04/17/2024 01:06:08, and has been read back.    HCT 21.0 (L) 36.0 - 46.0 %   MCV 111.1 (H) 80.0 - 100.0 fL   MCH 32.8 26.0 - 34.0 pg   MCHC 29.5 (L) 30.0 - 36.0 g/dL   RDW 84.0 (H) 88.4 - 84.4 %   Platelets 67 (L) 150 - 400 K/uL    Comment: REPEATED TO VERIFY Immature Platelet Fraction may be clinically indicated, consider ordering this additional  test [id number]    nRBC 0.0 0.0 - 0.2 %    Comment: Performed at [facility], [address], [place], [place] [address]  Type and screen  [facility]     Status: None   Collection Time: 04/17/24  2:24 AM  Result Value Ref Range   ABO/RH(D) A POS    Antibody Screen NEG    Sample Expiration 04/20/2024,2359    Unit Number [id number]    Blood Component Type RED CELLS,LR    Unit division 00    Status of Unit ISSUED,FINAL    Transfusion Status OK TO TRANSFUSE    Crossmatch Result      Compatible Performed at [facility], [address], [place], [place] [address]    Unit Number [id number]    Blood Component Type RED CELLS,LR    Unit division 00    Status of Unit ISSUED,FINAL    Transfusion Status OK TO TRANSFUSE    Crossmatch Result Compatible   BPAM RBC     Status: None   Collection Time: 04/17/24  2:24 AM  Result Value Ref Range   ISSUE DATE / TIME 797490959672    Blood Product Unit Number [id number]    PRODUCT CODE [id number]    Unit Type and Rh 9500    Blood Product Expiration Date 797490897640    ISSUE DATE / TIME 797490958577    Blood Product Unit Number [id number]    PRODUCT CODE [id number]    Unit Type and Rh 6200    Blood Product Expiration Date [id number]   Prepare RBC (crossmatch)     Status: None   Collection Time: 04/17/24  2:30 AM  Result Value Ref Range   Order Confirmation      ORDER PROCESSED BY BLOOD BANK Performed at [facility], [address] [place], [place] [address]   Transfusion reaction     Status: None   Collection Time: 04/17/24  4:52 AM  Result Value Ref Range   Post RXN DAT IgG NEG    DAT C3 NEG    Path interp tx rxn      THERE IS NO LABORATORY EVIDENCE OF A HEMOLYTIC TRANSFUSION REACTION. DYSPNIA CAN INDICATE A MILD ALLERGIC REACTION WHICH IS UNLIKELY IN THIS CASE. ADDITIONAL TRANSFUSIONS ARE APPROVED. REVIEWED BY NORLEEN DOVER, MD 04/17/24 Performed at [facility], [address], [place], [place] [address]   CBC     Status: Abnormal   Collection Time: 04/17/24  7:57 AM  Result Value Ref Range   WBC 8.1 4.0 - 10.5 K/uL   RBC  2.20 (L) 3.87 - 5.11 MIL/uL   Hemoglobin 7.1 (L) 12.0 - 15.0 g/dL   HCT 76.2 (L) 63.9 - 53.9 %   MCV 107.7 (H) 80.0 - 100.0 fL   MCH 32.3 26.0 - 34.0 pg   MCHC 30.0 30.0 - 36.0 g/dL   RDW 83.7 (H) 88.4 - 84.4 %   Platelets 76 (L) 150 - 400 K/uL    Comment: REPEATED TO VERIFY Immature Platelet Fraction may be clinically indicated, consider ordering this additional test [id number]    nRBC 0.0 0.0 - 0.2 %    Comment: Performed at [facility], [address] [place], [place] [address]  Hepatic function panel     Status: Abnormal   Collection Time: 04/17/24  7:57 AM  Result Value Ref Range   Total Protein 5.3 (L) 6.5 - 8.1 g/dL   Albumin 2.8 (  L) 3.5 - 5.0 g/dL   AST 27 15 - 41 U/L   ALT 28 0 - 44 U/L   Alkaline Phosphatase 93 38 - 126 U/L   Total Bilirubin 0.4 0.0 - 1.2 mg/dL   Bilirubin, Direct <9.8 0.0 - 0.2 mg/dL   Indirect Bilirubin NOT CALCULATED 0.3 - 0.9 mg/dL    Comment: Performed at [facility], [address], [place], [place] [address]  Basic metabolic panel with GFR     Status: Abnormal   Collection Time: 04/17/24  7:57 AM  Result Value Ref Range   Sodium 144 135 - 145 mmol/L   Potassium 3.7 3.5 - 5.1 mmol/L   Chloride 117 (H) 98 - 111 mmol/L   CO2 18 (L) 22 - 32 mmol/L   Glucose, Bld 127 (H) 70 - 99 mg/dL    Comment: Glucose reference range applies only to samples taken after fasting for at least 8 hours.   BUN 27 (H) 6 - 20 mg/dL   Creatinine, Ser 8.17 (H) 0.44 - 1.00 mg/dL   Calcium  8.3 (L) 8.9 - 10.3 mg/dL   GFR, Estimated 32 (L) >60 mL/min    Comment: (NOTE) Calculated using the CKD-EPI Creatinine Equation (2021)    Anion gap 9 5 - 15    Comment: Performed at [facility], [address], [place], [place] [address]  Lactate dehydrogenase     Status: Abnormal   Collection Time: 04/17/24  7:57 AM  Result Value Ref Range   LDH 228 (H) 98 - 192 U/L    Comment: Performed at [facility], [address], [place], [place] [address]   Haptoglobin     Status: None   Collection Time: 04/17/24  7:57 AM  Result Value Ref Range   Haptoglobin 122 33 - 346 mg/dL    Comment: (NOTE) Performed At: [facility] [address] [place], [place] [id number] Jennette Shorter MD Ey:[id number]   Iron  and TIBC     Status: Abnormal   Collection Time: 04/17/24  7:57 AM  Result Value Ref Range   Iron  41 28 - 170 ug/dL   TIBC 775 (L) 749 - 549 ug/dL   Saturation Ratios 18 10.4 - 31.8 %   UIBC 183 ug/dL    Comment: Performed at [facility], [address] [place], [place] [address]  Ferritin     Status: None   Collection Time: 04/17/24  7:57 AM  Result Value Ref Range   Ferritin 182 11 - 307 ng/mL    Comment: Performed at [facility], [address], [place], [place] [address]  Vitamin B12     Status: None   Collection Time: 04/17/24  9:00 AM  Result Value Ref Range   Vitamin B-12 793 180 - 914 pg/mL    Comment: (NOTE) This assay is not validated for testing neonatal or myeloproliferative syndrome specimens for Vitamin B12 levels. Performed at [facility], [address], [place], [place] [address]   Folate     Status: None   Collection Time: 04/17/24  9:09 AM  Result Value Ref Range   Folate >20.0 >5.9 ng/mL    Comment: Performed at [facility], [address], [place], [place] [address]  Reticulocytes     Status: Abnormal   Collection Time: 04/17/24  9:09 AM  Result Value Ref Range   Retic Ct Pct 4.1 (H) 0.4 - 3.1 %   RBC. 2.15 (L)  3.87 - 5.11 MIL/uL   Retic Count, Absolute 88.6 19.0 - 186.0 K/uL   Immature Retic Fract 26.7 (H) 2.3 - 15.9 %    Comment: Performed at [facility], [address], [place], [place] [address]  Prepare RBC (crossmatch)     Status: None   Collection Time: 04/17/24 12:54 PM  Result Value Ref Range   Order Confirmation      ORDER PROCESSED BY BLOOD BANK Performed at [facility], [address] [place], [place] [address]   Urinalysis, Complete  w Microscopic -     Status: Abnormal   Collection Time: 04/17/24  1:00 PM  Result Value Ref Range   Color, Urine YELLOW YELLOW   APPearance CLOUDY (A) CLEAR   Specific Gravity, Urine 1.020 1.005 - 1.030   pH 6.0 5.0 - 8.0   Glucose, UA NEGATIVE NEGATIVE mg/dL   Hgb urine dipstick LARGE (A) NEGATIVE   Bilirubin Urine NEGATIVE NEGATIVE   Ketones, ur NEGATIVE NEGATIVE mg/dL   Protein, ur 899 (A) NEGATIVE mg/dL   Nitrite NEGATIVE NEGATIVE   Leukocytes,Ua MODERATE (A) NEGATIVE   Squamous Epithelial / HPF 0-5 0 - 5 /HPF   WBC, UA >50 0 - 5 WBC/hpf   RBC / HPF 11-20 0 - 5 RBC/hpf   Bacteria, UA MANY (A) NONE SEEN    Comment: Performed at [facility], [address] [place], [place] [address]  CBC     Status: Abnormal   Collection Time: 04/18/24  2:46 AM  Result Value Ref Range   WBC 8.0 4.0 - 10.5 K/uL   RBC 2.64 (L) 3.87 - 5.11 MIL/uL   Hemoglobin 8.7 (L) 12.0 - 15.0 g/dL   HCT 72.1 (L) 63.9 - 53.9 %   MCV 105.3 (H) 80.0 - 100.0 fL   MCH 33.0 26.0 - 34.0 pg   MCHC 31.3 30.0 - 36.0 g/dL   RDW 82.5 (H) 88.4 - 84.4 %   Platelets 70 (L) 150 - 400 K/uL    Comment: REPEATED TO VERIFY Immature Platelet Fraction may be clinically indicated, consider ordering this additional test [id number]    nRBC 0.0 0.0 - 0.2 %    Comment: Performed at [facility], [address], [place], [place] [address]      ASSESSMENT AND PLAN  Active secondary progressive multiple sclerosis  Multiple sclerosis - Plan: Stratify JCV Antibody Test ([facility]), CBC with Differential/Platelet, Comprehensive metabolic panel with GFR  High risk medication use - Plan: Stratify JCV Antibody Test ([facility]), CBC with Differential/Platelet, Comprehensive metabolic panel with GFR  Obstructive sleep apnea  Spastic quadriparesis (HCC)  Anemia, unspecified type   Continue teriflunomide  14 mg.  Recent lab work shows anemia.  She is advised to contact primary care to see if additional testing or referral for  transfusion is necessary.   Platelet count was also low but unlikely to be low enough to cause bleeds.  Port will be flushed about every 4-6 weeks.  She feels she did best when she was on Tysabri.  She converted to JCV positive but she was low titer positive.  Therefore, we will recheck a JCV antibody test next time blood work is performed and we consider switching to Tysabri if she is only low positive.  If medium or high positive, I would not recommend  continue BiPAP ST 19/15 BUR12   They will be building a handicap accessible home.  The husband plans on starting an overhead lift for  improved safety compared to a [employer] lift.    rtc 6 months, sooner if ne or worsening neurologic issues  This visit is part of a comprehensive longitudinal care medical relationship regarding the patients primary diagnosis of MS and related concerns.  Maybelline Kolarik A. Vear, MD, PhD 06/16/2024, 3:00 PM Certified in Neurology, Clinical Neurophysiology, Sleep Medicine, Pain Medicine and Neuroimaging  [facility] [address] [place], [place] [address] [phone]  Addendum 09/08/20:  Fixed error in history - correct statement should be she switched from Tysabri to Vumerity. -- RAS

## 2024-04-08 NOTE — Progress Notes (Signed)
 SABRA

## 2024-04-11 ENCOUNTER — Emergency Department (HOSPITAL_COMMUNITY)

## 2024-04-11 ENCOUNTER — Encounter (HOSPITAL_COMMUNITY): Payer: Self-pay | Admitting: Emergency Medicine

## 2024-04-11 ENCOUNTER — Inpatient Hospital Stay (HOSPITAL_COMMUNITY)
Admission: EM | Admit: 2024-04-11 | Discharge: 2024-04-14 | DRG: 682 | Disposition: A | Discharge status: Home / Self Care | Source: Ambulatory Visit | Attending: Internal Medicine | Admitting: Internal Medicine

## 2024-04-11 ENCOUNTER — Other Ambulatory Visit: Payer: Self-pay

## 2024-04-11 DIAGNOSIS — Z7401 Bed confinement status: Secondary | ICD-10-CM

## 2024-04-11 DIAGNOSIS — R680 Hypothermia, not associated with low environmental temperature: Secondary | ICD-10-CM | POA: Diagnosis present

## 2024-04-11 DIAGNOSIS — D696 Thrombocytopenia, unspecified: Secondary | ICD-10-CM | POA: Diagnosis present

## 2024-04-11 DIAGNOSIS — N179 Acute kidney failure, unspecified: Secondary | ICD-10-CM | POA: Diagnosis present

## 2024-04-11 DIAGNOSIS — Z79899 Other long term (current) drug therapy: Secondary | ICD-10-CM | POA: Diagnosis not present

## 2024-04-11 DIAGNOSIS — G35 Multiple sclerosis: Secondary | ICD-10-CM | POA: Diagnosis present

## 2024-04-11 DIAGNOSIS — G839 Paralytic syndrome, unspecified: Secondary | ICD-10-CM | POA: Insufficient documentation

## 2024-04-11 DIAGNOSIS — B002 Herpesviral gingivostomatitis and pharyngotonsillitis: Secondary | ICD-10-CM | POA: Diagnosis present

## 2024-04-11 DIAGNOSIS — N189 Chronic kidney disease, unspecified: Secondary | ICD-10-CM | POA: Diagnosis not present

## 2024-04-11 DIAGNOSIS — D649 Anemia, unspecified: Secondary | ICD-10-CM | POA: Diagnosis not present

## 2024-04-11 DIAGNOSIS — G4733 Obstructive sleep apnea (adult) (pediatric): Secondary | ICD-10-CM | POA: Diagnosis present

## 2024-04-11 DIAGNOSIS — N1831 Chronic kidney disease, stage 3a: Secondary | ICD-10-CM | POA: Diagnosis present

## 2024-04-11 DIAGNOSIS — Z993 Dependence on wheelchair: Secondary | ICD-10-CM

## 2024-04-11 DIAGNOSIS — T68XXXS Hypothermia, sequela: Secondary | ICD-10-CM | POA: Diagnosis not present

## 2024-04-11 DIAGNOSIS — I129 Hypertensive chronic kidney disease with stage 1 through stage 4 chronic kidney disease, or unspecified chronic kidney disease: Secondary | ICD-10-CM | POA: Diagnosis present

## 2024-04-11 DIAGNOSIS — N3000 Acute cystitis without hematuria: Secondary | ICD-10-CM | POA: Diagnosis present

## 2024-04-11 DIAGNOSIS — E038 Other specified hypothyroidism: Secondary | ICD-10-CM | POA: Diagnosis not present

## 2024-04-11 DIAGNOSIS — Z8249 Family history of ischemic heart disease and other diseases of the circulatory system: Secondary | ICD-10-CM | POA: Diagnosis not present

## 2024-04-11 DIAGNOSIS — F32A Depression, unspecified: Secondary | ICD-10-CM | POA: Diagnosis present

## 2024-04-11 DIAGNOSIS — Z83438 Family history of other disorder of lipoprotein metabolism and other lipidemia: Secondary | ICD-10-CM

## 2024-04-11 DIAGNOSIS — I10 Essential (primary) hypertension: Secondary | ICD-10-CM | POA: Diagnosis present

## 2024-04-11 DIAGNOSIS — L89151 Pressure ulcer of sacral region, stage 1: Secondary | ICD-10-CM | POA: Diagnosis present

## 2024-04-11 DIAGNOSIS — K5190 Ulcerative colitis, unspecified, without complications: Secondary | ICD-10-CM | POA: Diagnosis present

## 2024-04-11 DIAGNOSIS — N390 Urinary tract infection, site not specified: Principal | ICD-10-CM

## 2024-04-11 DIAGNOSIS — G8250 Quadriplegia, unspecified: Secondary | ICD-10-CM | POA: Diagnosis present

## 2024-04-11 DIAGNOSIS — F418 Other specified anxiety disorders: Secondary | ICD-10-CM | POA: Diagnosis not present

## 2024-04-11 DIAGNOSIS — Z885 Allergy status to narcotic agent status: Secondary | ICD-10-CM

## 2024-04-11 DIAGNOSIS — F411 Generalized anxiety disorder: Secondary | ICD-10-CM | POA: Diagnosis present

## 2024-04-11 DIAGNOSIS — D631 Anemia in chronic kidney disease: Secondary | ICD-10-CM | POA: Diagnosis present

## 2024-04-11 DIAGNOSIS — E785 Hyperlipidemia, unspecified: Secondary | ICD-10-CM | POA: Diagnosis present

## 2024-04-11 DIAGNOSIS — Z7982 Long term (current) use of aspirin: Secondary | ICD-10-CM

## 2024-04-11 DIAGNOSIS — K5100 Ulcerative (chronic) pancolitis without complications: Secondary | ICD-10-CM

## 2024-04-11 DIAGNOSIS — Z79890 Hormone replacement therapy (postmenopausal): Secondary | ICD-10-CM

## 2024-04-11 DIAGNOSIS — E039 Hypothyroidism, unspecified: Secondary | ICD-10-CM | POA: Diagnosis present

## 2024-04-11 DIAGNOSIS — Z833 Family history of diabetes mellitus: Secondary | ICD-10-CM

## 2024-04-11 DIAGNOSIS — Z820 Family history of epilepsy and other diseases of the nervous system: Secondary | ICD-10-CM

## 2024-04-11 DIAGNOSIS — J45909 Unspecified asthma, uncomplicated: Secondary | ICD-10-CM | POA: Diagnosis present

## 2024-04-11 DIAGNOSIS — Z8719 Personal history of other diseases of the digestive system: Secondary | ICD-10-CM | POA: Diagnosis not present

## 2024-04-11 DIAGNOSIS — A6009 Herpesviral infection of other urogenital tract: Secondary | ICD-10-CM | POA: Diagnosis present

## 2024-04-11 DIAGNOSIS — N3001 Acute cystitis with hematuria: Secondary | ICD-10-CM | POA: Diagnosis present

## 2024-04-11 DIAGNOSIS — J4520 Mild intermittent asthma, uncomplicated: Secondary | ICD-10-CM

## 2024-04-11 DIAGNOSIS — G629 Polyneuropathy, unspecified: Secondary | ICD-10-CM | POA: Diagnosis present

## 2024-04-11 DIAGNOSIS — Z881 Allergy status to other antibiotic agents status: Secondary | ICD-10-CM

## 2024-04-11 DIAGNOSIS — T68XXXA Hypothermia, initial encounter: Secondary | ICD-10-CM

## 2024-04-11 DIAGNOSIS — Z880 Allergy status to penicillin: Secondary | ICD-10-CM

## 2024-04-11 DIAGNOSIS — Z888 Allergy status to other drugs, medicaments and biological substances status: Secondary | ICD-10-CM

## 2024-04-11 LAB — URINALYSIS, ROUTINE W REFLEX MICROSCOPIC
Bilirubin Urine: NEGATIVE
Glucose, UA: NEGATIVE mg/dL
Ketones, ur: NEGATIVE mg/dL
Nitrite: NEGATIVE
Protein, ur: 30 mg/dL — AB
Specific Gravity, Urine: 1.011 (ref 1.005–1.030)
WBC, UA: >50 WBC/hpf (ref 0–5)
pH: 7 (ref 5.0–8.0)

## 2024-04-11 LAB — COMPREHENSIVE METABOLIC PANEL WITH GFR
ALT: 24 U/L (ref 0–44)
AST: 22 U/L (ref 15–41)
Albumin: 3.8 g/dL (ref 3.5–5.0)
Alkaline Phosphatase: 162 U/L — ABNORMAL HIGH (ref 38–126)
Anion gap: 15 (ref 5–15)
BUN: 49 mg/dL — ABNORMAL HIGH (ref 6–20)
CO2: 21 mmol/L — ABNORMAL LOW (ref 22–32)
Calcium: 9.4 mg/dL (ref 8.9–10.3)
Chloride: 110 mmol/L (ref 98–111)
Creatinine, Ser: 2.05 mg/dL — ABNORMAL HIGH (ref 0.44–1.00)
GFR, Estimated: 27 mL/min — ABNORMAL LOW (ref >60)
Glucose, Bld: 86 mg/dL (ref 70–99)
Potassium: 5.1 mmol/L (ref 3.5–5.1)
Sodium: 145 mmol/L (ref 135–145)
Total Bilirubin: 0.3 mg/dL (ref 0.0–1.2)
Total Protein: 6.1 g/dL — ABNORMAL LOW (ref 6.5–8.1)

## 2024-04-11 LAB — CBC
HCT: 29.3 % — ABNORMAL LOW (ref 36.0–46.0)
Hemoglobin: 8.6 g/dL — ABNORMAL LOW (ref 12.0–15.0)
MCH: 32.1 pg (ref 26.0–34.0)
MCHC: 29.4 g/dL — ABNORMAL LOW (ref 30.0–36.0)
MCV: 109.3 fL — ABNORMAL HIGH (ref 80.0–100.0)
Platelets: 97 K/uL — ABNORMAL LOW (ref 150–400)
RBC: 2.68 MIL/uL — ABNORMAL LOW (ref 3.87–5.11)
RDW: 15.9 % — ABNORMAL HIGH (ref 11.5–15.5)
WBC: 6.9 K/uL (ref 4.0–10.5)
nRBC: 0 % (ref 0.0–0.2)

## 2024-04-11 LAB — TYPE AND SCREEN
ABO/RH(D): A POS
Antibody Screen: NEGATIVE

## 2024-04-11 LAB — SODIUM, URINE, RANDOM: Sodium, Ur: 107 mmol/L

## 2024-04-11 LAB — CREATININE, URINE, RANDOM: Creatinine, Urine: 31 mg/dL

## 2024-04-11 MED ORDER — SODIUM CHLORIDE 0.9% FLUSH: 3.0000 mL | INTRAVENOUS | Status: DC | PRN

## 2024-04-11 MED ORDER — SODIUM CHLORIDE 0.9 % IV SOLN
1.0000 g | INTRAVENOUS | Status: DC
  Administered 2024-04-12 – 2024-04-14 (×3): 1 g via INTRAVENOUS
  Filled 2024-04-11 (×3): qty 10

## 2024-04-11 MED ORDER — ROSUVASTATIN CALCIUM 20 MG PO TABS: 40.0000 mg | ORAL_TABLET | Freq: Every day | ORAL | Status: DC

## 2024-04-11 MED ORDER — HYDRALAZINE HCL 20 MG/ML IJ SOLN: 10.0000 mg | Freq: Four times a day (QID) | INTRAMUSCULAR | Status: DC | PRN

## 2024-04-11 MED ORDER — FOLIC ACID 1 MG PO TABS
0.5000 mg | ORAL_TABLET | Freq: Every day | ORAL | Status: DC
  Administered 2024-04-12 – 2024-04-14 (×3): 0.5 mg via ORAL
  Filled 2024-04-11 (×3): qty 1

## 2024-04-11 MED ORDER — MESALAMINE ER 250 MG PO CPCR
500.0000 mg | ORAL_CAPSULE | Freq: Three times a day (TID) | ORAL | Status: DC
  Administered 2024-04-12 – 2024-04-14 (×8): 500 mg via ORAL
  Filled 2024-04-11 (×10): qty 2

## 2024-04-11 MED ORDER — DEXTROSE 50 % IV SOLN: 1.0000 | INTRAVENOUS | Status: DC | PRN

## 2024-04-11 MED ORDER — SODIUM CHLORIDE 0.9 % IV SOLN
250.0000 mL | INTRAVENOUS | Status: AC | PRN
Start: 2024-04-11 — End: 2024-04-12

## 2024-04-11 MED ORDER — ROSUVASTATIN CALCIUM 20 MG PO TABS
20.0000 mg | ORAL_TABLET | Freq: Every day | ORAL | Status: DC
  Administered 2024-04-11 – 2024-04-13 (×3): 20 mg via ORAL
  Filled 2024-04-11 (×3): qty 1

## 2024-04-11 MED ORDER — ACETAMINOPHEN 325 MG PO TABS
650.0000 mg | ORAL_TABLET | Freq: Four times a day (QID) | ORAL | Status: DC | PRN
  Administered 2024-04-13: 650 mg via ORAL
  Filled 2024-04-11 (×2): qty 2

## 2024-04-11 MED ORDER — LEVOTHYROXINE SODIUM 50 MCG PO TABS
75.0000 ug | ORAL_TABLET | Freq: Every day | ORAL | Status: DC
  Filled 2024-04-11: qty 1

## 2024-04-11 MED ORDER — OXCARBAZEPINE 150 MG PO TABS
150.0000 mg | ORAL_TABLET | Freq: Three times a day (TID) | ORAL | Status: DC
  Administered 2024-04-11 – 2024-04-14 (×8): 150 mg via ORAL
  Filled 2024-04-11 (×10): qty 1

## 2024-04-11 MED ORDER — FERROUS SULFATE 325 (65 FE) MG PO TABS
325.0000 mg | ORAL_TABLET | Freq: Every day | ORAL | Status: DC
  Administered 2024-04-12 – 2024-04-14 (×3): 325 mg via ORAL
  Filled 2024-04-11 (×3): qty 1

## 2024-04-11 MED ORDER — FAMOTIDINE 20 MG PO TABS
40.0000 mg | ORAL_TABLET | Freq: Two times a day (BID) | ORAL | Status: DC
  Administered 2024-04-11 – 2024-04-14 (×6): 40 mg via ORAL
  Filled 2024-04-11 (×6): qty 2

## 2024-04-11 MED ORDER — TERIFLUNOMIDE 14 MG PO TABS: 1.0000 | ORAL_TABLET | Freq: Every day | ORAL | Status: DC

## 2024-04-11 MED ORDER — ONDANSETRON HCL 4 MG PO TABS: 4.0000 mg | ORAL_TABLET | Freq: Four times a day (QID) | ORAL | Status: DC | PRN

## 2024-04-11 MED ORDER — ADULT MULTIVITAMIN W/MINERALS CH
1.0000 | ORAL_TABLET | Freq: Every day | ORAL | Status: DC
  Administered 2024-04-12 – 2024-04-14 (×3): 1 via ORAL
  Filled 2024-04-11 (×3): qty 1

## 2024-04-11 MED ORDER — HEPARIN SODIUM (PORCINE) 5000 UNIT/ML IJ SOLN: 5000.0000 [IU] | Freq: Three times a day (TID) | INTRAMUSCULAR | Status: DC

## 2024-04-11 MED ORDER — SODIUM CHLORIDE 0.9 % IV SOLN
2.0000 g | Freq: Once | INTRAVENOUS | Status: AC
  Administered 2024-04-11: 2 g via INTRAVENOUS
  Filled 2024-04-11: qty 20

## 2024-04-11 MED ORDER — SODIUM CHLORIDE 0.9% FLUSH
3.0000 mL | Freq: Two times a day (BID) | INTRAVENOUS | Status: DC
  Administered 2024-04-12 (×3): 3 mL via INTRAVENOUS

## 2024-04-11 MED ORDER — LABETALOL HCL 100 MG PO TABS
50.0000 mg | ORAL_TABLET | Freq: Every day | ORAL | Status: DC
  Administered 2024-04-11 – 2024-04-14 (×4): 50 mg via ORAL
  Filled 2024-04-11 (×4): qty 1

## 2024-04-11 MED ORDER — FESOTERODINE FUMARATE ER 4 MG PO TB24
4.0000 mg | ORAL_TABLET | Freq: Every day | ORAL | Status: DC
  Administered 2024-04-12 – 2024-04-14 (×3): 4 mg via ORAL
  Filled 2024-04-11 (×3): qty 1

## 2024-04-11 MED ORDER — QUETIAPINE FUMARATE 25 MG PO TABS
25.0000 mg | ORAL_TABLET | Freq: Every day | ORAL | Status: DC
  Administered 2024-04-11 – 2024-04-13 (×3): 25 mg via ORAL
  Filled 2024-04-11 (×4): qty 1

## 2024-04-11 MED ORDER — FUROSEMIDE 40 MG PO TABS: 20.0000 mg | ORAL_TABLET | Freq: Two times a day (BID) | ORAL | Status: DC

## 2024-04-11 MED ORDER — BACLOFEN 10 MG PO TABS
10.0000 mg | ORAL_TABLET | Freq: Three times a day (TID) | ORAL | Status: DC
  Administered 2024-04-11 – 2024-04-14 (×8): 10 mg via ORAL
  Filled 2024-04-11 (×8): qty 1

## 2024-04-11 MED ORDER — LACTATED RINGERS IV SOLN: INTRAVENOUS | Status: DC

## 2024-04-11 MED ORDER — MESALAMINE ER 0.375 G PO CP24: 0.3750 g | ORAL_CAPSULE | Freq: Four times a day (QID) | ORAL | Status: DC

## 2024-04-11 MED ORDER — SODIUM CHLORIDE 0.9 % IV BOLUS
1000.0000 mL | Freq: Once | INTRAVENOUS | Status: AC
Start: 2024-04-11 — End: 2024-04-11
  Administered 2024-04-11: 1000 mL via INTRAVENOUS

## 2024-04-11 MED ORDER — ONDANSETRON HCL 4 MG/2ML IJ SOLN: 4.0000 mg | Freq: Four times a day (QID) | INTRAMUSCULAR | Status: DC | PRN

## 2024-04-11 MED ORDER — FLUOXETINE HCL 20 MG PO CAPS
40.0000 mg | ORAL_CAPSULE | Freq: Two times a day (BID) | ORAL | Status: DC
  Administered 2024-04-11 – 2024-04-14 (×6): 40 mg via ORAL
  Filled 2024-04-11 (×6): qty 2

## 2024-04-11 MED ORDER — FOLIC ACID 400 MCG PO TABS: 400.0000 ug | ORAL_TABLET | Freq: Every day | ORAL | Status: DC

## 2024-04-11 MED ORDER — IBUPROFEN 200 MG PO TABS: 400.0000 mg | ORAL_TABLET | Freq: Four times a day (QID) | ORAL | Status: DC | PRN

## 2024-04-11 NOTE — ED Triage Notes (Signed)
 Patient c/o abnormal labs. Patient report her Hgb was 7.3 that was drawn last Monday. Patient report SOB. Patient denies chest pain. Patient denies N/V.

## 2024-04-11 NOTE — Telephone Encounter (Signed)
 Please let patient/husband know I spoke with kris, she is out of office on Fridays but going to review labs today and message her. On my brief review, her red blood cell count is low but has been historically between 2-3 over past several years. Her platelets are a bit lower than they were at last check 12/2023.

## 2024-04-11 NOTE — ED Notes (Signed)
 Attempted to obtain blood work on pt was unsuccessful, Pt claims UV IV or port access is needed.

## 2024-04-11 NOTE — ED Notes (Signed)
 This tech unable to get oral or axillary temp on pt. RN Alyssa told this tech to wait to obtain temperature until after pt receives blood as pt feels hot to touch.

## 2024-04-11 NOTE — ED Provider Notes (Signed)
 [place] EMERGENCY DEPARTMENT AT [facility] Provider Note   CSN: [id number] Arrival date & time: 04/11/24  1330     Patient presents with: Abnormal Labs   Vanessa Short is a 58 y.o. female.   Pt here with her husband.  Pt's husband gives most of pt's history.  Patient has a history of MS.  Patient has spastic quadriparesis.  Patient was recently seen by her primary care physician for evaluation of possible UTI.  Patient had laboratory evaluations drawn which showed her hemoglobin was low.  Patient has had increased weakness and shortness of breath.  Patient's primary care physician advised her husband to bring her to the emergency department for further evaluation and possible blood transfusion.  Patient has required blood transfusions in the past.  Patient has had a GI evaluation which did not show any signs of GI bleed.  Patient has not had any vomiting she has not had any black or tarry stools.  No melena.  Patient has a past medical history of MS, chronic kidney disease, CHF, anemia, dyslipidemia, anxiety, and altered mental status.  The history is provided by the patient and the spouse. No language interpreter was used.       Prior to Admission medications   Medication Sig Start Date End Date Taking? Authorizing Provider  acyclovir  (ZOVIRAX ) 400 MG tablet Take 400 mg by mouth daily. Take every day per patient 08/11/14   [provider]  b complex vitamins tablet Take 1 tablet by mouth daily.     [provider]  baclofen  (LIORESAL ) 10 MG tablet Take 1 tablet (10 mg total) by mouth 4 (four) times daily. 04/08/24   Sater, Charlie LABOR, MD  cefadroxil  (DURICEF) 500 MG capsule Take 1 capsule (500 mg total) by mouth 2 (two) times daily. 08/25/23   Elpidio Reyes DEL, MD  cholecalciferol  (VITAMIN D ) 1000 UNITS tablet Take 1,000 Units by mouth daily.  Patient not taking: Reported on 04/08/2024    [provider]  Cholecalciferol  (VITAMIN D ) 50 MCG (2000  UT) CAPS Take 2,000 Units by mouth daily.    [provider]  dantrolene  (DANTRIUM ) 50 MG capsule Take 1 capsule (50 mg total) by mouth 3 (three) times daily as needed. 08/22/23   Sater, Charlie LABOR, MD  famotidine  (PEPCID ) 40 MG tablet Take 40 mg by mouth 2 (two) times daily. 02/28/23   [provider]  ferrous sulfate  325 (65 FE) MG tablet Take 325 mg by mouth daily with breakfast. 08/02/23   [provider]  FLUoxetine  (PROZAC ) 40 MG capsule Take 40 mg by mouth 2 (two) times daily.    [provider]  folic acid  (FOLVITE ) 400 MCG tablet Take 400 mcg by mouth daily.    [provider]  furosemide  (LASIX ) 40 MG tablet Take 0.5 tablets (20 mg total) by mouth 2 (two) times daily. 04/25/23   Briana Elgin LABOR, MD  labetalol  (NORMODYNE ) 100 MG tablet TAKE 1/2 TABLET BY MOUTH DAILY 01/10/23   Sater, Charlie LABOR, MD  levothyroxine  (SYNTHROID ) 75 MCG tablet Take 75 mcg by mouth daily before breakfast.    [provider]  lidocaine -prilocaine  (EMLA ) cream Apply one inch before port-a cath access prn Patient taking differently: Apply 1 Application topically daily as needed (port access). 03/23/21   Sater, Charlie LABOR, MD  mesalamine  (APRISO ) 0.375 g 24 hr capsule Take 375 mg by mouth QID.    [provider]  Multiple Vitamin (MULTIVITAMIN) tablet Take 1 tablet by  mouth daily.    [provider]  OXcarbazepine  (TRILEPTAL ) 150 MG tablet Take 1 tablet (150 mg total) by mouth 3 (three) times daily. 08/22/23   Sater, Charlie LABOR, MD  potassium chloride  (KLOR-CON ) 10 MEQ tablet Take 2 tablets (20 mEq total) by mouth daily. Take 4 pills daily for 3 days then continue taking as before Patient taking differently: Take 10 mEq by mouth daily. 04/22/23   Adhikari, Amrit, MD  QUEtiapine  (SEROQUEL ) 25 MG tablet One po qHS and one po every day prn Patient taking differently: Take 25 mg by mouth at bedtime. 02/12/23   Sater, Charlie LABOR, MD  rosuvastatin  (CRESTOR ) 40 MG  tablet Take 40 mg by mouth at bedtime.    [provider]  SODIUM BICARBONATE PO Take by mouth. 1300 mg 2 pills in the morning and 1 in afternoon    [provider]  sodium chloride  (OCEAN) 0.65 % SOLN nasal spray Place 1 spray into both nostrils as needed for congestion. 08/19/16   Rai, Ripudeep MARLA, MD  Teriflunomide  14 MG TABS Take 1 tablet (14 mg total) by mouth daily. 02/18/24   Sater, Charlie LABOR, MD  tolterodine  (DETROL ) 2 MG tablet Take 2 mg by mouth 2 (two) times daily.  05/09/18   [provider]    Allergies: Nitrofurantoin, Oxycodone -acetaminophen , Risperidone and paliperidone, and Penicillins    Review of Systems  Respiratory:  Positive for cough.   Genitourinary:  Positive for dysuria.  All other systems reviewed and are negative.   Updated Vital Signs BP (!) 153/81   Pulse 74   Temp (!) 93.9 F (34.4 C) (Rectal)   Resp 18   SpO2 98%   Physical Exam Vitals and nursing note reviewed.  Constitutional:      Appearance: She is well-developed.  HENT:     Head: Normocephalic.     Mouth/Throat:     Mouth: Mucous membranes are moist.  Cardiovascular:     Rate and Rhythm: Normal rate.  Pulmonary:     Effort: Pulmonary effort is normal.  Abdominal:     General: Abdomen is flat. There is no distension.     Palpations: Abdomen is soft.  Musculoskeletal:     Cervical back: Neck supple.  Skin:    General: Skin is warm.  Neurological:     General: No focal deficit present.     Mental Status: She is alert.     (all labs ordered are listed, but only abnormal results are displayed) Labs Reviewed  CBC - Abnormal; Notable for the following components:      Result Value   RBC 2.68 (*)    Hemoglobin 8.6 (*)    HCT 29.3 (*)    MCV 109.3 (*)    MCHC 29.4 (*)    RDW 15.9 (*)    Platelets 97 (*)    All other components within normal limits  COMPREHENSIVE METABOLIC PANEL WITH GFR - Abnormal; Notable for the following components:   CO2 21 (*)    BUN  49 (*)    Creatinine, Ser 2.05 (*)    Total Protein 6.1 (*)    Alkaline Phosphatase 162 (*)    GFR, Estimated 27 (*)    All other components within normal limits  URINALYSIS, ROUTINE W REFLEX MICROSCOPIC - Abnormal; Notable for the following components:   APPearance CLOUDY (*)    Hgb urine dipstick LARGE (*)    Protein, ur 30 (*)    Leukocytes,Ua LARGE (*)  Bacteria, UA RARE (*)    All other components within normal limits  TYPE AND SCREEN    EKG: None  Radiology: DG Chest Port 1 View Result Date: 04/11/2024 CLINICAL DATA:  Shortness of breath EXAM: PORTABLE CHEST 1 VIEW COMPARISON:  Chest x-ray 08/21/2023 FINDINGS: Right chest port catheter tip ends in the SVC. Cardiomediastinal silhouette is stable, the heart is mildly enlarged. There is no focal lung infiltrate, pleural effusion or pneumothorax. No acute fractures are seen. IMPRESSION: No active disease. Mild cardiomegaly. Electronically Signed   By: Greig Pique M.D.   On: 04/11/2024 17:59     Procedures   Medications Ordered in the ED  sodium chloride  0.9 % bolus 1,000 mL (0 mLs Intravenous Stopped 04/11/24 1831)                                    Medical Decision Making Patient is here with her husband for evaluation of possible anemia requiring blood transfusion.  Patient has recently had UTI symptoms but had a negative urine.  Patient had laboratory evaluation which showed her hemoglobin to be 7.  Amount and/or Complexity of Data Reviewed Independent Historian: spouse    Details: Patient's history is given by her husband. Labs: ordered. Decision-making details documented in ED Course.    Details: Labs ordered reviewed and interpreted patient's hemoglobin is 8.6 Cath UA shows large leukocytes greater than 50 white blood cells.  Urine culture is ordered Radiology: ordered and independent interpretation performed. Decision-making details documented in ED Course.    Details: Chest x-ray ordered reviewed and  interpreted chest x-ray shows no acute findings Discussion of management or test interpretation with external provider(s): Hospitalist consulted for admission  Risk Decision regarding hospitalization. Risk Details: Nurse reports multiple attempts at Temperature.   She was able to obtain a rectal temperature which is 93.9.        Final diagnoses:  Urinary tract infection with hematuria, site unspecified  Anemia, unspecified type  MS (multiple sclerosis) (HCC)  Hypothermia, initial encounter    ED Discharge Orders     None          Flint Sonny MARLA DEVONNA 04/11/24 2050    Laurice Maude BROCKS, MD 04/11/24 [phone]

## 2024-04-11 NOTE — ED Notes (Signed)
Bair hugger in place at this time.

## 2024-04-11 NOTE — Telephone Encounter (Signed)
 Did I send her portal message to you earlier?

## 2024-04-11 NOTE — Telephone Encounter (Signed)
 Copied from CRM #[id number]. Topic: Clinical Concerns - Medical Question >> Apr 11, 2024  8:13 AM Suzen Cohens Barrett wrote: Vanessa Short is calling other request    Include all details related to the request(s) below:  Patients husband calling in concerned with patients lab results. Sent a message via [facility] and has not gotten a response. Requesting for someone from office to contact them as soon as possible today to know how to proceed.    Confirm and type the Best Contact Number below:  Patient/caller contact number:  [phone]           [] Home  [x] Mobile  [] Work [] Other   [x] Okay to leave a voicemail   Medication List:  Current Outpatient Medications:  .  acyclovir  (ZOVIRAX ) 400 mg tablet, TAKE 1 TABLET BY MOUTH EVERY DAY, Disp: 90 tablet, Rfl: 3 .  aspirin  81 mg EC tablet, Take 81 mg by mouth Once Daily., Disp: , Rfl:  .  b complex vitamins tab tablet, Take 1 tablet by mouth Once Daily., Disp: , Rfl:  .  baclofen  (LIORESAL ) 10 mg tablet, Take 10 mg by mouth. QID, Disp: , Rfl:  .  calcitrioL (ROCALTROL) 0.25 mcg capsule, Take 1 capsule (0.25 mcg total) by mouth daily., Disp: 90 capsule, Rfl: 1 .  cholecalciferol  (VITAMIN D3) 2,000 unit cap capsule, Take 2,000 Units by mouth daily., Disp: , Rfl:  .  dantrolene  (DANTRIM) 50 mg cap capsule, TAKE 1 CAPSULE 3 TIMES DAILY, Disp: , Rfl: 0 .  famotidine  (PEPCID ) 40 mg tablet, Take 1 tablet (40 mg total) by mouth 2 (two) times a day., Disp: 180 tablet, Rfl: 3 .  ferrous sulfate  325 mg (65 mg iron) tablet, Take one pill per day try to take with vitamin c if possible, Disp: 90 tablet, Rfl: 3 .  FLUoxetine  (PROzac ) 40 mg capsule, Take one pill bid, Disp: 180 capsule, Rfl: 3 .  folic acid  (FOLVITE ) 1 mg tablet, Take 1 mg by mouth daily. 400 mcg, Disp: , Rfl:  .  furosemide  (LASIX ) 40 mg tablet, 1/2 pill twice per day, Disp: , Rfl:  .  labetaloL  (NORMODYNE ) 100 mg tablet, Take half pill per day, Disp: , Rfl:  .  levothyroxine  (SYNTHROID ) 75 mcg  tablet, Take 1 tablet (75 mcg total) by mouth every morning., Disp: 90 tablet, Rfl: 3 .  lidocaine -prilocaine  (EMLA ) 2.5-2.5 % cream, Apply topically as needed for mild pain (1-3)., Disp: , Rfl:  .  mesalamine  (APRISO ) 0.375 gram cp24 capsule, Take 375 mg by mouth 4 (four) times a day., Disp: , Rfl:  .  multivitamin (THERAGRAN) tab tablet, Take 1 tablet by mouth Once Daily., Disp: , Rfl:  .  OXcarbazepine  (TRILEPTAL ) 150 mg tablet, TAKE 1 TABLET 3 TIMES DAILY  States she only takes at night., Disp: , Rfl:  .  potassium chloride  (KLOR-CON ) 10 mEq ER tablet, TAKE 1 TABLET BY MOUTH EVERY DAY, Disp: 90 tablet, Rfl: 0 .  QUEtiapine  (SEROquel ) 25 mg tablet, TAKE 1 TABLET BY MOUTH EVERY NIGHT AT BEDTIME, Disp: 90 tablet, Rfl: 0 .  rosuvastatin  (CRESTOR ) 40 mg tablet, Take one pill per day, Disp: , Rfl:  .  sodium bicarbonate 650 mg tablet, Take 2 tablets (1,300 mg total) by mouth 2 (two) times a day., Disp: 360 tablet, Rfl: 1 .  sodium chloride  (OCEAN) 0.65 % nasal spray, Administer 1 spray into each nostril as needed for congestion., Disp: , Rfl:  .  teriflunomide  14 mg tab, Take 14 mg by mouth daily., Disp: ,  Rfl:  .  tolterodine  (DETROL ) 2 mg tablet, TAKE 1 TABLET BY MOUTH 2 TIMES A DAY., Disp: 180 tablet, Rfl: 1     Medication Request/Refills: Pharmacy Information (if applicable)   [x] Not Applicable       []  Pharmacy listed  Send Medication Request to:                                                 [] Pharmacy not listed (added to pharmacy list in Epic) Send Medication Request to:      Listed Pharmacies: [id number] - [place], [place] - [address] - PHONE: [phone] - FAX: [phone] [id number] - [place], [place] - [address] - PHONE: [phone] - FAX: [phone]

## 2024-04-11 NOTE — H&P (Signed)
 History and Physical    Vanessa Short FMW:[id number] DOB: [birth year] DOA: 04/11/2024  PCP: [name], [name]   Patient coming from: Home   Chief Complaint:  Chief Complaint  Patient presents with   Abnormal Labs   ED TRIAGE note:Patient c/o abnormal labs. Patient report her Hgb was 7.3 that was drawn last Monday. Patient report SOB. Patient denies chest pain. Patient denies N/V.   HPI:  Vanessa Short is a 58 y.o. female with medical history significant of multiple sclerosis with spastic quadriparesis on teriflunomide , wheelchair and bedbound at baseline, history of chronic anemia, chronic lower lymphocyte count, thrombocytopenia, ulcerative colitis on mesalamine , history of GI bleed, CKD stage IIIa, depression, anxiety, essential hypertension, hyperlipidemia, hypothyroidism, reactive airway disease and obstructive sleep apnea presented to  emergency department referred from primary care physician for evaluation for UTI and low hemoglobin 7.3. Patient reported that she has increased weakness and shortness of breath. Patient's primary care physician advised her husband to bring her to the emergency department for further evaluation and possible blood transfusion. Patient has required blood transfusions in the past. Patient has had a GI evaluation which did not show any signs of GI bleed.  Patient denies any nausea, vomiting, hematemesis, and melena.  Poor functional status at the baseline.  Patient denies any abdominal pain, nausea, vomiting, hematemesis and melena.  Her been at the bedside very anxious and concerned about patient's hemoglobin and informed him multiple times that currently hemoglobin is stable 8.6 no need for blood transfusion unless patient develops active GI bleed or become hemodynamically unstable with low hemoglobin.  He is also oriented about patient's renal function as patient has multiple insult of acute kidney injury in the past.   ED Course:  At  presentation to ED patient found hemodynamically stable except borderline hypertensive.  In the ED patient also found hypothermic temperature dropped to 93.4. CBC showing hemoglobin 8.6 (baseline hemoglobin around 7-10, normal WBC count, chronically low platelet count 97. CMP showing elevated creatinine 2.05, low bicarb 21 elevated BUN 49 and low GFR 27. UA shows evidence of UTI. Chest x-ray unremarkable except cardiomegaly. In the ED patient received 1 L of LR bolus and ceftriaxone  2 g.  Hospitalist has been consulted for further evaluation management of hypothermia, acute kidney injury, acute cystitis and chronic anemia.    Significant labs in the ED: Lab Orders         Urine Culture (for pregnant, neutropenic or urologic patients or patients with an indwelling urinary catheter)         CBC         Comprehensive metabolic panel         Urinalysis, Routine w reflex microscopic -Urine, Clean Catch         Sodium, urine, random         Creatinine, urine, random         CBC         Basic metabolic panel         TSH         T4, free         CBG monitoring, ED       Review of Systems:  Review of Systems  Constitutional:  Negative for chills, fever, malaise/fatigue and weight loss.  Respiratory:  Negative for cough, hemoptysis and shortness of breath.   Cardiovascular:  Negative for chest pain and palpitations.  Gastrointestinal:  Negative for abdominal pain, diarrhea, heartburn, nausea and vomiting.  Genitourinary:  Positive  for frequency. Negative for dysuria, flank pain, hematuria and urgency.  Musculoskeletal:  Negative for myalgias.  Neurological:  Negative for dizziness and headaches.  Psychiatric/Behavioral:  The patient is not nervous/anxious.     Past Medical History:  Diagnosis Date   Headache    Herpes genitalis in women    History of kidney stones    Hypertension    Kidney stones    Movement disorder    Multiple sclerosis (HCC)    Neuropathy    Oral herpes     Ulcerative colitis (HCC)    Vision abnormalities     Past Surgical History:  Procedure Laterality Date   BIOPSY  08/25/2023   Procedure: BIOPSY;  Surgeon: Saintclair Jasper, MD;  Location: [facility];  Service: Gastroenterology;;   ENDOMETRIAL ABLATION  10/10/2017   ESOPHAGOGASTRODUODENOSCOPY (EGD) WITH PROPOFOL  Left 08/25/2023   Procedure: ESOPHAGOGASTRODUODENOSCOPY (EGD) WITH PROPOFOL ;  Surgeon: Saintclair Jasper, MD;  Location: [facility];  Service: Gastroenterology;  Laterality: Left;   KIDNEY STONE SURGERY     PORTACATH PLACEMENT N/A 10/16/2017   Procedure: ULTRA SOUND GUIDED INSERTION PORT-A-CATH ERAS PATHWAY;  Surgeon: Stevie Herlene Righter, MD;  Location: [facility];  Service: General;  Laterality: N/A;     reports that she has never smoked. She has never used smokeless tobacco. She reports current alcohol use. She reports that she does not use drugs.  Allergies  Allergen Reactions   Penicillins Dermatitis, Rash and Other (See Comments)    Cannot take pure penicillin   Nitrofurantoin Other (See Comments)    Syncope    Oxycodone -Acetaminophen  Other (See Comments)    Hallucinations   Risperidone And Paliperidone Other (See Comments)    Became very lethargic    Family History  Problem Relation Age of Onset   Dementia Mother    Hypertension Father    Hyperlipidemia Father    Diabetes Father    Heart disease Father    Breast cancer Neg Hx     Prior to Admission medications   Medication Sig Start Date End Date Taking? Authorizing Provider  acyclovir  (ZOVIRAX ) 400 MG tablet Take 400 mg by mouth daily. Take every day per patient 08/11/14   [provider]  b complex vitamins tablet Take 1 tablet by mouth daily.     [provider]  baclofen  (LIORESAL ) 10 MG tablet Take 1 tablet (10 mg total) by mouth 4 (four) times daily. 04/08/24   Sater, Charlie LABOR, MD  cefadroxil  (DURICEF) 500 MG capsule Take 1 capsule (500 mg total) by mouth 2 (two) times daily. 08/25/23   Elpidio Reyes DEL, MD  cholecalciferol  (VITAMIN D ) 1000 UNITS tablet Take 1,000 Units by mouth daily.  Patient not taking: Reported on 04/08/2024    [provider]  Cholecalciferol  (VITAMIN D ) 50 MCG (2000 UT) CAPS Take 2,000 Units by mouth daily.    [provider]  dantrolene  (DANTRIUM ) 50 MG capsule Take 1 capsule (50 mg total) by mouth 3 (three) times daily as needed. 08/22/23   Sater, Charlie LABOR, MD  famotidine  (PEPCID ) 40 MG tablet Take 40 mg by mouth 2 (two) times daily. 02/28/23   [provider]  ferrous sulfate  325 (65 FE) MG tablet Take 325 mg by mouth daily with breakfast. 08/02/23   [provider]  FLUoxetine  (PROZAC ) 40 MG capsule Take 40 mg by mouth 2 (two) times daily.    [provider]  folic acid  (FOLVITE ) 400 MCG tablet Take 400 mcg by mouth daily.  [provider]  furosemide  (LASIX ) 40 MG tablet Take 0.5 tablets (20 mg total) by mouth 2 (two) times daily. 04/25/23   Briana Elgin LABOR, MD  labetalol  (NORMODYNE ) 100 MG tablet TAKE 1/2 TABLET BY MOUTH DAILY 01/10/23   Sater, Charlie LABOR, MD  levothyroxine  (SYNTHROID ) 75 MCG tablet Take 75 mcg by mouth daily before breakfast.    [provider]  lidocaine -prilocaine  (EMLA ) cream Apply one inch before port-a cath access prn Patient taking differently: Apply 1 Application topically daily as needed (port access). 03/23/21   Sater, Charlie LABOR, MD  mesalamine  (APRISO ) 0.375 g 24 hr capsule Take 375 mg by mouth QID.    [provider]  Multiple Vitamin (MULTIVITAMIN) tablet Take 1 tablet by mouth daily.    [provider]  OXcarbazepine  (TRILEPTAL ) 150 MG tablet Take 1 tablet (150 mg total) by mouth 3 (three) times daily. 08/22/23   Sater, Charlie LABOR, MD  potassium chloride  (KLOR-CON ) 10 MEQ tablet Take 2 tablets (20 mEq total) by mouth daily. Take 4 pills daily for 3 days then continue taking as before Patient taking differently: Take 10 mEq by mouth daily. 04/22/23   Jillian Buttery, MD  QUEtiapine  (SEROQUEL ) 25 MG tablet One po qHS and one po every day prn Patient taking differently: Take 25 mg by mouth at bedtime. 02/12/23   Sater, Charlie LABOR, MD  rosuvastatin  (CRESTOR ) 40 MG tablet Take 40 mg by mouth at bedtime.    [provider]  SODIUM BICARBONATE PO Take by mouth. 1300 mg 2 pills in the morning and 1 in afternoon    [provider]  sodium chloride  (OCEAN) 0.65 % SOLN nasal spray Place 1 spray into both nostrils as needed for congestion. 08/19/16   Rai, Ripudeep MARLA, MD  Teriflunomide  14 MG TABS Take 1 tablet (14 mg total) by mouth daily. 02/18/24   Sater, Charlie LABOR, MD  tolterodine  (DETROL ) 2 MG tablet Take 2 mg by mouth 2 (two) times daily.  05/09/18   [provider]     Physical Exam: Vitals:   04/12/24 0200 04/12/24 0300 04/12/24 0400 04/12/24 0404  BP: (!) 109/58  (!) 117/55   Pulse: 77  77 74  Resp: 19  (!) 22 (!) 22  Temp:  98.1 F (36.7 C)    TempSrc:      SpO2: 96%  96% 96%    Physical Exam Vitals and nursing note reviewed.  HENT:     Mouth/Throat:     Mouth: Mucous membranes are moist.  Eyes:     Pupils: Pupils are equal, round, and reactive to light.  Cardiovascular:     Rate and Rhythm: Normal rate and regular rhythm.     Pulses: Normal pulses.     Heart sounds: Normal heart sounds.  Pulmonary:     Effort: Pulmonary effort is normal.     Breath sounds: Normal breath sounds.  Abdominal:     Palpations: Abdomen is soft.  Musculoskeletal:     Cervical back: Neck supple.  Skin:    Capillary Refill: Capillary refill takes less than 2 seconds.  Neurological:     Mental Status: She is alert and oriented to person, place, and time.  Psychiatric:        Mood and Affect: Mood normal.      Labs on Admission: I have personally reviewed following labs and imaging studies  CBC: Recent Labs  Lab 04/07/24 1346 04/11/24 1615  WBC 7.1 6.9  NEUTROABS 5.5  --  HGB 7.3* 8.6*  HCT 22.6* 29.3*  MCV 101* 109.3*   PLT 77* 97*   Basic Metabolic Panel: Recent Labs  Lab 04/11/24 1615  NA 145  K 5.1  CL 110  CO2 21*  GLUCOSE 86  BUN 49*  CREATININE 2.05*  CALCIUM  9.4   GFR: Estimated Creatinine Clearance: 29.2 mL/min (A) (by C-G formula based on SCr of 2.05 mg/dL (H)). Liver Function Tests: Recent Labs  Lab 04/11/24 1615  AST 22  ALT 24  ALKPHOS 162*  BILITOT 0.3  PROT 6.1*  ALBUMIN 3.8   No results for input(s): LIPASE, AMYLASE in the last 168 hours. No results for input(s): AMMONIA in the last 168 hours. Coagulation Profile: No results for input(s): INR, PROTIME in the last 168 hours. Cardiac Enzymes: No results for input(s): CKTOTAL, CKMB, CKMBINDEX, TROPONINI, TROPONINIHS in the last 168 hours. BNP (last 3 results) No results for input(s): BNP in the last 8760 hours. HbA1C: No results for input(s): HGBA1C in the last 72 hours. CBG: Recent Labs  Lab 04/12/24 0041  GLUCAP 106*   Lipid Profile: No results for input(s): CHOL, HDL, LDLCALC, TRIG, CHOLHDL, LDLDIRECT in the last 72 hours. Thyroid  Function Tests: No results for input(s): TSH, T4TOTAL, FREET4, T3FREE, THYROIDAB in the last 72 hours. Anemia Panel: No results for input(s): VITAMINB12, FOLATE, FERRITIN, TIBC, IRON , RETICCTPCT in the last 72 hours. Urine analysis:    Component Value Date/Time   COLORURINE YELLOW 04/11/2024 1835   APPEARANCEUR CLOUDY (A) 04/11/2024 1835   LABSPEC 1.011 04/11/2024 1835   PHURINE 7.0 04/11/2024 1835   GLUCOSEU NEGATIVE 04/11/2024 1835   HGBUR LARGE (A) 04/11/2024 1835   BILIRUBINUR NEGATIVE 04/11/2024 1835   KETONESUR NEGATIVE 04/11/2024 1835   PROTEINUR 30 (A) 04/11/2024 1835   UROBILINOGEN 0.2 05/31/2013 0237   NITRITE NEGATIVE 04/11/2024 1835   LEUKOCYTESUR LARGE (A) 04/11/2024 1835    Radiological Exams on Admission: I have personally reviewed images DG Chest Port 1 View Result Date: 04/11/2024 CLINICAL DATA:   Shortness of breath EXAM: PORTABLE CHEST 1 VIEW COMPARISON:  Chest x-ray 08/21/2023 FINDINGS: Right chest port catheter tip ends in the SVC. Cardiomediastinal silhouette is stable, the heart is mildly enlarged. There is no focal lung infiltrate, pleural effusion or pneumothorax. No acute fractures are seen. IMPRESSION: No active disease. Mild cardiomegaly. Electronically Signed   By: Greig Pique M.D.   On: 04/11/2024 17:59     Assessment/Plan: Principal Problem:   Acute kidney injury superimposed on chronic kidney disease (HCC) Active Problems:   Acute cystitis   Hypothermia   Obstructive sleep apnea   Chronic anemia   Ulcerative colitis (HCC)   Essential hypertension   Wheelchair dependence   Anxiety with depression   History of GI bleed   Spastic paresis (HCC)   Thrombocytopenia (HCC)   Hypothyroidism   Reactive airway disease   Acute kidney injury superimposed on stage 3a chronic kidney disease (HCC)   Acute kidney injury (HCC)    Assessment and Plan: Acute kidney injury superimposed CKD stage IIIa -Patient has been referred from PCP clinic as lab work showed low hemoglobin 7.3.  However in the ED lab check showed hemoglobin 8.6 which is around baseline-baseline hemoglobin variable around 7-10.  Previously patient has history of GI bleed-melena and EGD unremarkable. - UA showing evidence of UTI.  CMP showed elevated creatinine 2.05 and GFR 27. - At presentation to ED patient is hemodynamically stable except found hypothermic. - Unclear etiology of acute kidney injury at this  time except from underlying hypothermia and UTI. - In the ED patient received 1 L of NS bolus. - Continue maintenance fluid LR at 75 cc/h. - Checking urine sodium and creatinine. - Monitor renal function, avoid neurotoxic agent and renally adjust medications. - Decrease baclofen  10 mg 4 times daily to 3 times daily in the setting of elevated creatinine.   Acute cystitis History of urinary  incontinence -UA shows evidence of UTI.  Patient has chronic urinary incontinence.  Denies any flank pain, fever and chill.  Denies any nausea vomiting. - Continue to treat with IV ceftriaxone .  Need to follow-up with urine culture result. -Continue Toviaz   Hypothermia -In the ED temperature has been checked total 3 times and it is consistent around 93 to 94 F.  Possibly patient has baseline hypothermia from bedbound status and poor mobility.  Otherwise hemodynamically stable hypertensive, heart rate and respiratory within good range. - Passenger transport manager.  Checking TSH and free T4.Vanessa Short  Continue to check blood glucose.  Chronic dyspnea Obstructive sleep apnea Morbid obesity - Continue BiPAP at bedtime  Chronic anemia History of GI bleed -Stable H&H 8.6 and 29.  Baseline hemoglobin around 7-10.  Continue to monitor.  Patient denies any hematemesis or melena. -Continue monitor H&H.  Continue oral iron  supplement.   History of MS with spastic quadriplegia Bedbound and wheelchair-bound status -Continue baclofen  10 mg 3 times daily for management of spasticity.  Continue teriflunomide  once daily.  Chronic thrombocytopenia -Platelet count 93 which is around baseline.  Continue to monitor.  Hypothyroidism -Continue levothyroxine   Generalized anxiety disorder -Continue Seroquel , trileptal , and Prozac .  Essential hypertension -Holding Lasix  in the setting of AKI.  Patient is euvolemic on physical exam.  Continue labetalol  50 mg daily and hydralazine  as needed.  History of ulcerative colitis - Continue mesalamine    DVT prophylaxis: SCD and TED hose.  Deferring pharmacological DVT prophylaxis in the setting of history of GI bleed and chronic thrombocytopenia Code Status:  Full Code Diet: Heart healthy diet Family Communication:   Family was present at bedside, at the time of interview. Opportunity was given to ask question and all questions were answered satisfactorily.  Disposition  Plan: Continue monitor improvement of renal function and need to follow-up with urine culture result. Consults: Respiratory care Admission status:   Inpatient, Telemetry bed  Severity of Illness: The appropriate patient status for this patient is INPATIENT. Inpatient status is judged to be reasonable and necessary in order to provide the required intensity of service to ensure the patient's safety. The patient's presenting symptoms, physical exam findings, and initial radiographic and laboratory data in the context of their chronic comorbidities is felt to place them at high risk for further clinical deterioration. Furthermore, it is not anticipated that the patient will be medically stable for discharge from the hospital within 2 midnights of admission.   * I certify that at the point of admission it is my clinical judgment that the patient will require inpatient hospital care spanning beyond 2 midnights from the point of admission due to high intensity of service, high risk for further deterioration and high frequency of surveillance required.Vanessa Short    Vanessa Zaragoza, MD [facility]  How to contact the [facility] Attending or Consulting provider 7A - 7P or covering provider during after hours 7P -7A, for this patient.  Check the care team in [facility] and look for a) attending/consulting [facility] provider listed and b) the [facility] team listed Log into [email or web address] and use [place]'s universal password  to access. If you do not have the password, please contact the hospital operator. Locate the [facility] provider you are looking for under [facility] and page to a number that you can be directly reached. If you still have difficulty reaching the provider, please page the [facility] (Director on Call) for the Hospitalists listed on amion for assistance.  04/12/2024, 4:45 AM

## 2024-04-11 NOTE — Telephone Encounter (Signed)
 Mr.Mohrmann is aware of lab results and that Elberta spoke with Kris(out of office) and that Monica will advise on lab results and message her today/pos,lpn

## 2024-04-11 NOTE — ED Notes (Signed)
 ED TO INPATIENT HANDOFF REPORT  Name/Age/Gender Vanessa Short 58 y.o. female  Code Status    Code Status Orders  (From admission, onward)           Start     Ordered   04/11/24 2040  Full code  Continuous       Question:  By:  Answer:  Consent: discussion documented in EHR   04/11/24 2040           Code Status History     Date Active Date Inactive Code Status Order ID Comments User Context   08/22/2023 0026 08/25/2023 2301 Full Code [id number]  Dena Charleston, MD ED   04/25/2023 0049 04/25/2023 2055 Full Code [id number]  Ricky Alfrieda DASEN, DO ED   04/17/2023 2006 04/21/2023 1814 Full Code [id number]  Tobie Jorie SAUNDERS, MD ED   01/31/2023 1937 02/03/2023 1817 Full Code [id number]  Mansy, Madison LABOR, MD ED   01/26/2023 0853 01/28/2023 1823 Full Code [id number]  Barbarann Nest, MD ED   01/26/2023 0429 01/26/2023 0853 Full Code [id number]  Howerter, Eva NOVAK, DO ED   07/29/2018 1615 07/31/2018 2131 Full Code [id number]  Lanny Maxwell, MD ED   09/30/2016 0149 10/02/2016 1726 Full Code [id number]  Charlton Evalene RAMAN, MD Inpatient   08/05/2016 0251 08/19/2016 1950 Full Code [id number]  Danford, Lonni SQUIBB, MD ED       Home/SNF/Other Home  Chief Complaint Acute kidney injury superimposed on stage 3a chronic kidney disease (HCC) [N17.9, N18.31]  Level of Care/Admitting Diagnosis ED Disposition     ED Disposition  Admit   Condition  --   Comment  Hospital Area: [facility] [[id number]]  Level of Care: Progressive [102]  Admit to Progressive based on following criteria: Other see comments  Comments: Hypothermia  May admit patient to [facility] or [facility] if equivalent level of care is available:: No  Covid Evaluation: Asymptomatic - no recent exposure (last 10 days) testing not required  Diagnosis: Acute kidney injury superimposed on stage 3a chronic kidney disease [facility]) [[id number]]  Admitting Physician: SUNDIL, SUBRINA [[id number]]  Attending Physician: SUNDIL, SUBRINA [[id number]]   Certification:: I certify this patient will need inpatient services for at least 2 midnights  Expected Medical Readiness: 04/14/2024          Medical History Past Medical History:  Diagnosis Date   Headache    Herpes genitalis in women    History of kidney stones    Hypertension    Kidney stones    Movement disorder    Multiple sclerosis (HCC)    Neuropathy    Oral herpes    Ulcerative colitis (HCC)    Vision abnormalities     Allergies Allergies  Allergen Reactions   Nitrofurantoin Other (See Comments)    syncope   Oxycodone -Acetaminophen  Other (See Comments)   Risperidone And Paliperidone Other (See Comments)    Became very lethargic   Penicillins Rash    Did it involve swelling of the face/tongue/throat, SOB, or low BP? No Did it involve sudden or severe rash/hives, skin peeling, or any reaction on the inside of your mouth or nose? Yes Did you need to seek medical attention at a hospital or doctor's office? No When did it last happen? Over 30 Years      If all above answers are NO, may proceed with cephalosporin use.      IV Location/Drains/Wounds Patient Lines/Drains/Airways Status     Active Line/Drains/Airways     Name Placement date  Placement time Site Days   Implanted Port 10/16/17 Right Chest 10/16/17  1407  Chest  2369            Labs/Imaging Results for orders placed or performed during the hospital encounter of 04/11/24 (from the past 48 hours)  CBC     Status: Abnormal   Collection Time: 04/11/24  4:15 PM  Result Value Ref Range   WBC 6.9 4.0 - 10.5 K/uL   RBC 2.68 (L) 3.87 - 5.11 MIL/uL   Hemoglobin 8.6 (L) 12.0 - 15.0 g/dL   HCT 70.6 (L) 63.9 - 53.9 %   MCV 109.3 (H) 80.0 - 100.0 fL   MCH 32.1 26.0 - 34.0 pg   MCHC 29.4 (L) 30.0 - 36.0 g/dL   RDW 84.0 (H) 88.4 - 84.4 %   Platelets 97 (L) 150 - 400 K/uL    Comment: SPECIMEN CHECKED FOR CLOTS PLATELET COUNT CONFIRMED BY SMEAR REPEATED TO VERIFY Immature Platelet Fraction may  be clinically indicated, consider ordering this additional test [id number]    nRBC 0.0 0.0 - 0.2 %    Comment: Performed at [facility], [address], [place], [place] [address]  Comprehensive metabolic panel     Status: Abnormal   Collection Time: 04/11/24  4:15 PM  Result Value Ref Range   Sodium 145 135 - 145 mmol/L   Potassium 5.1 3.5 - 5.1 mmol/L   Chloride 110 98 - 111 mmol/L   CO2 21 (L) 22 - 32 mmol/L   Glucose, Bld 86 70 - 99 mg/dL    Comment: Glucose reference range applies only to samples taken after fasting for at least 8 hours.   BUN 49 (H) 6 - 20 mg/dL   Creatinine, Ser 7.94 (H) 0.44 - 1.00 mg/dL   Calcium  9.4 8.9 - 10.3 mg/dL   Total Protein 6.1 (L) 6.5 - 8.1 g/dL   Albumin 3.8 3.5 - 5.0 g/dL   AST 22 15 - 41 U/L   ALT 24 0 - 44 U/L   Alkaline Phosphatase 162 (H) 38 - 126 U/L   Total Bilirubin 0.3 0.0 - 1.2 mg/dL   GFR, Estimated 27 (L) >60 mL/min    Comment: (NOTE) Calculated using the CKD-EPI Creatinine Equation (2021)    Anion gap 15 5 - 15    Comment: Performed at [facility], [address], [place], [place] [address]  Type and screen Ordered by PROVIDER DEFAULT     Status: None   Collection Time: 04/11/24  5:20 PM  Result Value Ref Range   ABO/RH(D) A POS    Antibody Screen NEG    Sample Expiration      04/14/2024,2359 Performed at [facility], [address] [place], [place] [address]   Urinalysis, Routine w reflex microscopic -Urine, Clean Catch     Status: Abnormal   Collection Time: 04/11/24  6:35 PM  Result Value Ref Range   Color, Urine YELLOW YELLOW   APPearance CLOUDY (A) CLEAR   Specific Gravity, Urine 1.011 1.005 - 1.030   pH 7.0 5.0 - 8.0   Glucose, UA NEGATIVE NEGATIVE mg/dL   Hgb urine dipstick LARGE (A) NEGATIVE   Bilirubin Urine NEGATIVE NEGATIVE   Ketones, ur NEGATIVE NEGATIVE mg/dL   Protein, ur 30 (A) NEGATIVE mg/dL   Nitrite NEGATIVE NEGATIVE   Leukocytes,Ua LARGE  (A) NEGATIVE   RBC / HPF 21-50 0 - 5 RBC/hpf   WBC, UA >50 0 - 5 WBC/hpf  Bacteria, UA RARE (A) NONE SEEN   Squamous Epithelial / HPF 6-10 0 - 5 /HPF   Mucus PRESENT     Comment: Performed at [facility], [address] [place], [place] [address]   DG Chest Port 1 View Result Date: 04/11/2024 CLINICAL DATA:  Shortness of breath EXAM: PORTABLE CHEST 1 VIEW COMPARISON:  Chest x-ray 08/21/2023 FINDINGS: Right chest port catheter tip ends in the SVC. Cardiomediastinal silhouette is stable, the heart is mildly enlarged. There is no focal lung infiltrate, pleural effusion or pneumothorax. No acute fractures are seen. IMPRESSION: No active disease. Mild cardiomegaly. Electronically Signed   By: Greig Pique M.D.   On: 04/11/2024 17:59    Pending Labs Unresulted Labs (From admission, onward)     Start     Ordered   04/12/24 0500  CBC  Tomorrow morning,   R        04/11/24 2040   04/12/24 0500  Basic metabolic panel  Tomorrow morning,   R        04/11/24 2040   04/11/24 2048  T4, free  Add-on,   AD        04/11/24 2047   04/11/24 2047  TSH  Add-on,   AD        04/11/24 2047   04/11/24 2041  Sodium, urine, random  Once,   R        04/11/24 2040   04/11/24 2041  Creatinine, urine, random  Once,   R        04/11/24 2040   04/11/24 2030  Urine Culture (for pregnant, neutropenic or urologic patients or patients with an indwelling urinary catheter)  (Urine Labs)  Once,   URGENT       Question Answer Comment  Indication Dysuria   Patient immune status Immunocompromised   Release to patient Immediate      04/11/24 2029            Vitals/Pain Today's Vitals   04/11/24 1630 04/11/24 1831 04/11/24 1900 04/11/24 1930  BP: (!) 164/75  (!) 153/81 (!) 168/86  Pulse: 74  74 78  Resp: 18  18   Temp:  (!) 93.9 F (34.4 C)    TempSrc:  Rectal    SpO2: 99%  98% 93%  PainSc:        Isolation Precautions No active isolations  Medications Medications  labetalol   (NORMODYNE ) tablet 50 mg (has no administration in time range)  rosuvastatin  (CRESTOR ) tablet 40 mg (has no administration in time range)  FLUoxetine  (PROZAC ) capsule 40 mg (has no administration in time range)  QUEtiapine  (SEROQUEL ) tablet 25 mg (has no administration in time range)  Teriflunomide  TABS 14 mg (has no administration in time range)  famotidine  (PEPCID ) tablet 40 mg (has no administration in time range)  levothyroxine  (SYNTHROID ) tablet 75 mcg (has no administration in time range)  mesalamine  (APRISO ) 24 hr capsule 0.375 g (has no administration in time range)  fesoterodine  (TOVIAZ ) tablet 4 mg (has no administration in time range)  ferrous sulfate  tablet 325 mg (has no administration in time range)  folic acid  (FOLVITE ) tablet 400 mcg (has no administration in time range)  baclofen  (LIORESAL ) tablet 10 mg (has no administration in time range)  OXcarbazepine  (TRILEPTAL ) tablet 150 mg (has no administration in time range)  multivitamin tablet 1 tablet (has no administration in time range)  lactated ringers  infusion (has no administration in time range)  cefTRIAXone  (ROCEPHIN ) 1 g in sodium chloride  0.9 %  100 mL IVPB (has no administration in time range)  sodium chloride  flush (NS) 0.9 % injection 3 mL (has no administration in time range)  sodium chloride  flush (NS) 0.9 % injection 3 mL (has no administration in time range)  0.9 %  sodium chloride  infusion (has no administration in time range)  ondansetron  (ZOFRAN ) tablet 4 mg (has no administration in time range)    Or  ondansetron  (ZOFRAN ) injection 4 mg (has no administration in time range)  dextrose  50 % solution 50 mL (has no administration in time range)  acetaminophen  (TYLENOL ) tablet 650 mg (has no administration in time range)  hydrALAZINE  (APRESOLINE ) injection 10 mg (has no administration in time range)  sodium chloride  0.9 % bolus 1,000 mL (0 mLs Intravenous Stopped 04/11/24 1831)  cefTRIAXone  (ROCEPHIN ) 2 g in  sodium chloride  0.9 % 100 mL IVPB (2 g Intravenous New Bag/Given 04/11/24 2010)    Mobility non-ambulatory

## 2024-04-11 NOTE — ED Notes (Signed)
 Temp unable to read   tried multiple times   husband states it was low this morning

## 2024-04-12 DIAGNOSIS — N189 Chronic kidney disease, unspecified: Secondary | ICD-10-CM | POA: Diagnosis not present

## 2024-04-12 DIAGNOSIS — N179 Acute kidney failure, unspecified: Secondary | ICD-10-CM | POA: Diagnosis not present

## 2024-04-12 LAB — CBC
HCT: 28.2 % — ABNORMAL LOW (ref 36.0–46.0)
Hemoglobin: 8.1 g/dL — ABNORMAL LOW (ref 12.0–15.0)
MCH: 31.3 pg (ref 26.0–34.0)
MCHC: 28.7 g/dL — ABNORMAL LOW (ref 30.0–36.0)
MCV: 108.9 fL — ABNORMAL HIGH (ref 80.0–100.0)
Platelets: 108 K/uL — ABNORMAL LOW (ref 150–400)
RBC: 2.59 MIL/uL — ABNORMAL LOW (ref 3.87–5.11)
RDW: 15.7 % — ABNORMAL HIGH (ref 11.5–15.5)
WBC: 6.9 K/uL (ref 4.0–10.5)
nRBC: 0 % (ref 0.0–0.2)

## 2024-04-12 LAB — BASIC METABOLIC PANEL WITH GFR
Anion gap: 12 (ref 5–15)
BUN: 44 mg/dL — ABNORMAL HIGH (ref 6–20)
CO2: 22 mmol/L (ref 22–32)
Calcium: 9.1 mg/dL (ref 8.9–10.3)
Chloride: 113 mmol/L — ABNORMAL HIGH (ref 98–111)
Creatinine, Ser: 2 mg/dL — ABNORMAL HIGH (ref 0.44–1.00)
GFR, Estimated: 28 mL/min — ABNORMAL LOW (ref >60)
Glucose, Bld: 99 mg/dL (ref 70–99)
Potassium: 5.2 mmol/L — ABNORMAL HIGH (ref 3.5–5.1)
Sodium: 147 mmol/L — ABNORMAL HIGH (ref 135–145)

## 2024-04-12 LAB — CBG MONITORING, ED
Glucose-Capillary: 101 mg/dL — ABNORMAL HIGH (ref 70–99)
Glucose-Capillary: 106 mg/dL — ABNORMAL HIGH (ref 70–99)

## 2024-04-12 LAB — GLUCOSE, CAPILLARY
Glucose-Capillary: 97 mg/dL (ref 70–99)
Glucose-Capillary: 91 mg/dL (ref 70–99)

## 2024-04-12 LAB — MRSA NEXT GEN BY PCR, NASAL: MRSA by PCR Next Gen: NOT DETECTED

## 2024-04-12 MED ORDER — SODIUM CHLORIDE 0.9% FLUSH: 10.0000 mL | Freq: Two times a day (BID) | INTRAVENOUS | Status: DC

## 2024-04-12 MED ORDER — SALINE SPRAY 0.65 % NA SOLN
1.0000 | NASAL | Status: DC | PRN
  Administered 2024-04-13: 1 via NASAL
  Filled 2024-04-12: qty 44

## 2024-04-12 MED ORDER — OXYMETAZOLINE HCL 0.05 % NA SOLN
1.0000 | Freq: Two times a day (BID) | NASAL | Status: DC | PRN
  Filled 2024-04-12: qty 15

## 2024-04-12 MED ORDER — CHLORHEXIDINE GLUCONATE CLOTH 2 % EX PADS
6.0000 | MEDICATED_PAD | Freq: Every day | CUTANEOUS | Status: DC
  Administered 2024-04-12 – 2024-04-14 (×3): 6 via TOPICAL

## 2024-04-12 MED ORDER — LEVOTHYROXINE SODIUM 50 MCG PO TABS
75.0000 ug | ORAL_TABLET | Freq: Every day | ORAL | Status: DC
  Administered 2024-04-13: 75 ug via ORAL
  Filled 2024-04-12: qty 1

## 2024-04-12 MED ORDER — SODIUM CHLORIDE 0.9% FLUSH: 10.0000 mL | INTRAVENOUS | Status: DC | PRN

## 2024-04-12 MED ORDER — LACTATED RINGERS IV SOLN: INTRAVENOUS | Status: DC

## 2024-04-12 NOTE — Progress Notes (Addendum)
 Removed PT from BiPAP (per Husbands request). Placed PT on 2 LPM nasal cannula. PT uses ResMed AirCurve 10-ST at home with full face mask per husband. Husband is unsure of home BiPAP settings. RN aware.

## 2024-04-12 NOTE — Progress Notes (Signed)
 PROGRESS NOTE    Vanessa Short  FMW:[id number] DOB: 22-Dec-1965 DOA: 04/11/2024 PCP: [name], [name]    Brief Narrative:   Vanessa Short is a 58 y.o. 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 Patient reported that she has increased weakness and shortness of breath.  Patient's primary care physician advised her husband to bring her to the emergency department for further evaluation and possible blood transfusion. Patient has required blood transfusions in the past. Patient has had a GI evaluation which did not show any signs of GI bleed.  Patient denies any nausea, vomiting, hematemesis, and melena.   Patient with poor functional status at baseline.  Patient denies any abdominal pain, nausea, vomiting, hematemesis and melena.  Her been at the bedside very anxious and concerned about patient's hemoglobin and informed him multiple times that currently hemoglobin is stable 8.6 no need for blood transfusion unless patient develops active GI bleed or become hemodynamically unstable with low hemoglobin.  He is also concerned about patient's renal function as patient has multiple insult of acute kidney injury in the past.   In the ED, temperature 93.9 F, HR 62, RR 16, BP 142/65, SpO2 100% on room air.  WBC 6.9, hemoglobin 8.6, platelet count 97.  Sodium 145, potassium 5.1, chloride 110, CO2 21, glucose 86, BUN 49, creatinine 2.05 (baseline 1.4 - 1.9).  AST 22, ALT 24, total bilirubin 0.3.  Urinalysis with large  leukocytes, negative nitrite, rare bacteria, greater than 50 WBCs.  Chest x-ray with mild cardiomegaly, otherwise no other acute cardiopulmonary disease process. In the ED patient received 1 L of LR bolus and ceftriaxone  2 g. Hospitalist has been consulted for further evaluation management of hypothermia, acute kidney injury, acute cystitis and chronic anemia.      Assessment & Plan:   Acute kidney injury superimposed CKD stage IIIa Patient follows with nephrology outpatient.  Baseline creatinine between 1.4-1.9.  Creatinine slightly elevated 2.05 on admission. -- Cr 2.05>2.00 -- Continue IVF with LR at 75 mL/h --Holding home furosemide  -- Avoid nephrotoxins, renally dose all medications (Baclofen  reduced from 10mg  QID to TID) -- BMP daily   Acute cystitis History of urinary incontinence UA shows evidence of UTI.  Patient has chronic urinary incontinence.  Denies any flank pain, fever and chill.  Denies any nausea vomiting. -- Urine culture: Pending -- Ceftriaxone  1 g IV every 24 hours - Continue Toviaz    Hypothermia: Resolved In the ED temperature has been checked total 3 times and it is consistent around 93 to 94 F.  Possibly patient has baseline hypothermia from bedbound status, poor mobility; and likely component of her autonomic dysfunction given her multiple sclerosis.  Initially Bair hugger was placed and subsequent discontinued with resolution of hypothermia.  Otherwise hemodynamically stable hypertensive, heart rate and respiratory within good range.   Chronic dyspnea Obstructive sleep apnea Morbid obesity - Continue BiPAP at bedtime   Chronic anemia History of GI bleed Stable H&H 8.6 and 29.  Baseline hemoglobin around 7-10.  Continue to monitor.  Patient denies any hematemesis or melena. -- Hgb 8.6>8.1 - Continue ferrous sulfate  325 mg  p.o. daily -- CBC in a.m.   History of MS with spastic quadriplegia Bedbound and wheelchair-bound status Follows with neurology  outpatient, Dr. Vear.  - Continue baclofen  10 mg 3 times daily for management of spasticity.   -- Continue teriflunomide  once daily.   Chronic thrombocytopenia, stable - Platelet count 93>108 which is around baseline.   -- CBC daily   Hypothyroidism - Continue levothyroxine  75 mcg PO at bedtime (patient refuses to take in the morning)   Generalized anxiety disorder - Continue Seroquel  25 mg p.o. nightly, trileptal  100 g p.o. 3 times daily, and Prozac  40 mg p.o. twice daily.   Essential hypertension -- Labetalol  50 mg p.o. daily - Holding Lasix  in the setting of AKI.    History of ulcerative colitis - Continue mesalamine  500 mg p.o. 3 times daily   DVT prophylaxis: SCDs Start: 04/11/24 2041 Place TED hose Start: 04/11/24 2041    Code Status: Full Code Family Communication: Updated spouse present at bedside this morning  Disposition Plan:  Level of care: Telemetry Status is: Inpatient Remains inpatient appropriate because: IV antibiotics    Consultants:  None  Procedures:  None  Antimicrobials:  Ceftriaxone  8/29>>   Subjective: Patient seen and examined at bedside, lying in bed.  Spouse present at bedside.  Hypothermia remains resolved.  Spouse continues to perseverate/be concerned regarding her renal function.  Discussed her renal function is stable, improved to 2.00 today with previous baseline of 1.4-1.9.  Remains on IV fluids and IV antibiotics.  Awaiting urine culture identification.  Patient spouse does report continued hallucinations overnight, which is typical when she experiences a urinary tract infection.  No other Spenser questions, concerns or complaints at this time.  Denies headache, no visual changes, no chest pain, no shortness of breath, abdominal pain, no fever/chills/night sweats.  No acute events overnight per nursing staff.  Objective: Vitals:   04/12/24 0912 04/12/24 1000 04/12/24 1030 04/12/24 1031  BP:  (!) 131/55  (!) 131/55  Pulse: 84 82  86 87  Resp: 18 (!) 21 18   Temp:      TempSrc:      SpO2: 90% 92% 92%     Intake/Output Summary (Last 24 hours) at 04/12/2024 1129 Last data filed at 04/11/2024 1831 Gross per 24 hour  Intake 1000 ml  Output --  Net 1000 ml   There were no vitals filed for this visit.  Examination:  Physical Exam: GEN: NAD, alert and oriented x 3, chronically ill appearance HEENT: NCAT, sclera clear, MMM PULM: CTAB w/o wheezes/crackles, normal respiratory effort, on room air CV: RRR w/o M/G/R GI: abd soft, NTND, + BS MSK: no peripheral edema PSYCH: normal mood/affect Integumentary: No concerning rashes/lesions/wounds noted exposed skin surfaces    Data Reviewed: I have personally reviewed following labs and imaging studies  CBC: Recent Labs  Lab 04/07/24 1346 04/11/24 1615 04/12/24 0527  WBC 7.1 6.9 6.9  NEUTROABS 5.5  --   --   HGB 7.3* 8.6* 8.1*  HCT 22.6* 29.3* 28.2*  MCV 101* 109.3* 108.9*  PLT 77* 97* 108*   Basic Metabolic Panel: Recent Labs  Lab 04/11/24 1615 04/12/24 0527  NA 145 147*  K 5.1 5.2*  CL 110 113*  CO2 21* 22  GLUCOSE 86 99  BUN 49* 44*  CREATININE 2.05* 2.00*  CALCIUM  9.4 9.1   GFR: Estimated Creatinine Clearance: 30 mL/min (A) (by C-G formula based on SCr of 2 mg/dL (H)). Liver Function Tests: Recent Labs  Lab 04/11/24  1615  AST 22  ALT 24  ALKPHOS 162*  BILITOT 0.3  PROT 6.1*  ALBUMIN 3.8   No results for input(s): LIPASE, AMYLASE in the last 168 hours. No results for input(s): AMMONIA in the last 168 hours. Coagulation Profile: No results for input(s): INR, PROTIME in the last 168 hours. Cardiac Enzymes: No results for input(s): CKTOTAL, CKMB, CKMBINDEX, TROPONINI in the last 168 hours. BNP (last 3 results) No results for input(s): PROBNP in the last 8760 hours. HbA1C: No results for input(s): HGBA1C in the last 72 hours. CBG: Recent Labs  Lab 04/12/24 0041 04/12/24 0618  GLUCAP 106* 101*   Lipid  Profile: No results for input(s): CHOL, HDL, LDLCALC, TRIG, CHOLHDL, LDLDIRECT in the last 72 hours. Thyroid  Function Tests: No results for input(s): TSH, T4TOTAL, FREET4, T3FREE, THYROIDAB in the last 72 hours. Anemia Panel: No results for input(s): VITAMINB12, FOLATE, FERRITIN, TIBC, IRON , RETICCTPCT in the last 72 hours. Sepsis Labs: No results for input(s): PROCALCITON, LATICACIDVEN in the last 168 hours.  No results found for this or any previous visit (from the past 240 hours).       Radiology Studies: DG Chest Port 1 View Result Date: 04/11/2024 CLINICAL DATA:  Shortness of breath EXAM: PORTABLE CHEST 1 VIEW COMPARISON:  Chest x-ray 08/21/2023 FINDINGS: Right chest port catheter tip ends in the SVC. Cardiomediastinal silhouette is stable, the heart is mildly enlarged. There is no focal lung infiltrate, pleural effusion or pneumothorax. No acute fractures are seen. IMPRESSION: No active disease. Mild cardiomegaly. Electronically Signed   By: Greig Pique M.D.   On: 04/11/2024 17:59        Scheduled Meds:  baclofen   10 mg Oral TID   famotidine   40 mg Oral BID   ferrous sulfate   325 mg Oral Q breakfast   fesoterodine   4 mg Oral Daily   FLUoxetine   40 mg Oral BID   folic acid   0.5 mg Oral Daily   labetalol   50 mg Oral Daily   [START ON 04/13/2024] levothyroxine   75 mcg Oral QHS   mesalamine   500 mg Oral TID   multivitamin with minerals  1 tablet Oral Daily   OXcarbazepine   150 mg Oral TID   QUEtiapine   25 mg Oral QHS   rosuvastatin   20 mg Oral QHS   sodium chloride  flush  3 mL Intravenous Q12H   Teriflunomide   1 tablet Oral Daily   Continuous Infusions:  sodium chloride      cefTRIAXone  (ROCEPHIN )  IV Stopped (04/12/24 1101)   lactated ringers  75 mL/hr at 04/12/24 0008     LOS: 1 day    Time spent: 52 minutes spent on 04/12/2024 caring for this patient face-to-face including chart review, ordering labs/tests, documenting,  discussion with nursing staff, consultants, updating family and interview/physical exam    Camellia PARAS [place], DO [facility] Available via Epic secure chat 7am-7pm After these hours, please refer to coverage provider listed on [email or web address] 04/12/2024, 11:29 AM

## 2024-04-12 NOTE — Progress Notes (Signed)
   04/12/24 0129  BiPAP/CPAP/SIPAP  $ Non-Invasive Ventilator  Non-Invasive Vent Initial  $ Face Mask Medium Yes  BiPAP/CPAP/SIPAP Pt Type Adult  BiPAP/CPAP/SIPAP SERVO  Mask Type Full face mask  Dentures removed? Not applicable  Mask Size Medium  Set Rate 16 breaths/min  Respiratory Rate 18 breaths/min  IPAP 18 cmH20  EPAP 15 cmH2O  Pressure Support 3 cmH20  PEEP 15 cmH20  FiO2 (%) 28 %  Minute Ventilation 6.8  Leak 62  Peak Inspiratory Pressure (PIP) 18  Tidal Volume (Vt) 365  Patient Home Machine No  Patient Home Mask No  Patient Home Tubing No  Auto Titrate No  Press High Alarm 25 cmH2O  Nasal massage performed Yes  CPAP/SIPAP surface wiped down Yes  Device Plugged into RED Power Outlet Yes  BiPAP/CPAP /SiPAP Vitals  Pulse Rate 78  Resp 18  SpO2 95 %  MEWS Score/Color  MEWS Score 0  MEWS Score Color Vanessa Short

## 2024-04-12 NOTE — Plan of Care (Signed)
 Initial presentation patient was hypothermic temperature was 93 which improved after Bair hugger placement temperature has been improved to 98.  At this time patient does not meet criteria to be admitted to stepdown unit transitioning to telemetry bed.  Saphronia Ozdemir, MD [facility] 04/12/2024, 4:48 AM

## 2024-04-12 NOTE — Consult Note (Signed)
 WOC Nurse Consult Note: Reason for Consult: sacral wound  Wound type: Stage 1 Pressure Injury sacrum/upper buttocks  Pressure Injury POA: Yes Measurement: see nursing flowsheet  Wound bed: nonblanchable erythema  Drainage (amount, consistency, odor) none  Periwound: Dressing procedure/placement/frequency:  Cleanse sacrum/ buttocks with soap and water, dry and apply silicone foam to area.  Lift daily to assess.  Reconsult if further needs arise.   Thank you,    Powell Bar MSN, RN-BC, [employer]

## 2024-04-12 NOTE — Plan of Care (Signed)

## 2024-04-13 DIAGNOSIS — N179 Acute kidney failure, unspecified: Secondary | ICD-10-CM | POA: Diagnosis not present

## 2024-04-13 DIAGNOSIS — N189 Chronic kidney disease, unspecified: Secondary | ICD-10-CM | POA: Diagnosis not present

## 2024-04-13 LAB — GLUCOSE, CAPILLARY
Glucose-Capillary: 42 mg/dL — CL (ref 70–99)
Glucose-Capillary: 83 mg/dL (ref 70–99)
Glucose-Capillary: 180 mg/dL — ABNORMAL HIGH (ref 70–99)
Glucose-Capillary: 113 mg/dL — ABNORMAL HIGH (ref 70–99)

## 2024-04-13 LAB — URINE CULTURE: Culture: NO GROWTH

## 2024-04-13 LAB — BASIC METABOLIC PANEL WITH GFR
Anion gap: 13 (ref 5–15)
BUN: 37 mg/dL — ABNORMAL HIGH (ref 6–20)
CO2: 20 mmol/L — ABNORMAL LOW (ref 22–32)
Calcium: 9.1 mg/dL (ref 8.9–10.3)
Chloride: 114 mmol/L — ABNORMAL HIGH (ref 98–111)
Creatinine, Ser: 1.85 mg/dL — ABNORMAL HIGH (ref 0.44–1.00)
GFR, Estimated: 31 mL/min — ABNORMAL LOW (ref >60)
Glucose, Bld: 85 mg/dL (ref 70–99)
Potassium: 4.6 mmol/L (ref 3.5–5.1)
Sodium: 147 mmol/L — ABNORMAL HIGH (ref 135–145)

## 2024-04-13 LAB — CBC
HCT: 27.5 % — ABNORMAL LOW (ref 36.0–46.0)
Hemoglobin: 7.9 g/dL — ABNORMAL LOW (ref 12.0–15.0)
MCH: 31.3 pg (ref 26.0–34.0)
MCHC: 28.7 g/dL — ABNORMAL LOW (ref 30.0–36.0)
MCV: 109.1 fL — ABNORMAL HIGH (ref 80.0–100.0)
Platelets: 96 K/uL — ABNORMAL LOW (ref 150–400)
RBC: 2.52 MIL/uL — ABNORMAL LOW (ref 3.87–5.11)
RDW: 15.6 % — ABNORMAL HIGH (ref 11.5–15.5)
WBC: 7.4 K/uL (ref 4.0–10.5)
nRBC: 0 % (ref 0.0–0.2)

## 2024-04-13 LAB — TSH: TSH: 4.04 u[IU]/mL (ref 0.350–4.500)

## 2024-04-13 LAB — T4, FREE: Free T4: 0.6 ng/dL — ABNORMAL LOW (ref 0.61–1.12)

## 2024-04-13 MED ORDER — LACTATED RINGERS IV SOLN: INTRAVENOUS | Status: AC

## 2024-04-13 NOTE — Plan of Care (Signed)
  Problem: Clinical Measurements: Goal: Ability to maintain clinical measurements within normal limits will improve Outcome: Progressing Goal: Diagnostic test results will improve Outcome: Progressing Goal: Respiratory complications will improve Outcome: Progressing Goal: Cardiovascular complication will be avoided Outcome: Progressing   Problem: Pain Managment: Goal: General experience of comfort will improve and/or be controlled Outcome: Progressing   Problem: Safety: Goal: Ability to remain free from injury will improve Outcome: Progressing   Problem: Skin Integrity: Goal: Risk for impaired skin integrity will decrease Outcome: Progressing

## 2024-04-13 NOTE — Progress Notes (Signed)
 PROGRESS NOTE    [name]  FMW:[id number] DOB: 01/09/1966 DOA: 04/11/2024 PCP: [name], [name]    Brief Narrative:   Vanessa Short is a 58 y.o. female with past medical history significant for multiple sclerosis with spastic quadriparesis on teriflunomide , wheelchair and bedbound at baseline, HTN, HLD, CKD stage IIIa, hypothyroidism, anxiety/depression, history of chronic anemia, chronic lower lymphocyte count, thrombocytopenia, ulcerative colitis on mesalamine , history of GI bleed, reactive airway disease and obstructive sleep apnea who presented to [facility] ED on 04/11/2024 by referral from her primary care physician for evaluation for UTI and low hemoglobin 7.3. Patient reported that she has increased weakness and shortness of breath.  Patient's primary care physician advised her husband to bring her to the emergency department for further evaluation and possible blood transfusion. Patient has required blood transfusions in the past. Patient has had a GI evaluation which did not show any signs of GI bleed.  Patient denies any nausea, vomiting, hematemesis, and melena.   Patient with poor functional status at baseline.  Patient denies any abdominal pain, nausea, vomiting, hematemesis and melena.  Her been at the bedside very anxious and concerned about patient's hemoglobin and informed him multiple times that currently hemoglobin is stable 8.6 no need for blood transfusion unless patient develops active GI bleed or become hemodynamically unstable with low hemoglobin.  He is also concerned about patient's renal function as patient has multiple insult of acute kidney injury in the past.   In the ED, temperature 93.9 F, HR 62, RR 16, BP 142/65, SpO2 100% on room air.  WBC 6.9, hemoglobin 8.6, platelet count 97.  Sodium 145, potassium 5.1, chloride 110, CO2 21, glucose 86, BUN 49, creatinine 2.05 (baseline 1.4 - 1.9).  AST 22, ALT 24, total bilirubin 0.3.  Urinalysis with large  leukocytes, negative nitrite, rare bacteria, greater than 50 WBCs.  Chest x-ray with mild cardiomegaly, otherwise no other acute cardiopulmonary disease process. In the ED patient received 1 L of LR bolus and ceftriaxone  2 g. Hospitalist has been consulted for further evaluation management of hypothermia, acute kidney injury, acute cystitis and chronic anemia.      Assessment & Plan:   Acute kidney injury superimposed CKD stage IIIa Patient follows with nephrology outpatient.  Baseline creatinine between 1.4-1.9.  Creatinine slightly elevated 2.05 on admission. -- Cr 2.05>2.00>1.85 -- Continue IVF with LR at 75 mL/h -- Holding home furosemide  -- Avoid nephrotoxins, renally dose all medications (Baclofen  reduced from 10mg  QID to TID) -- BMP daily   Acute cystitis History of urinary incontinence UA shows evidence of UTI.  Patient has chronic urinary incontinence.  Denies any flank pain, fever and chill.  Denies any nausea vomiting. -- Urine culture: Pending -- Ceftriaxone  1 g IV every 24 hours - Continue Toviaz    Hypothermia: Resolved In the ED temperature has been checked total 3 times and it is consistent around 93 to 94 F.  Possibly patient has baseline hypothermia from bedbound status, poor mobility; and likely component of her autonomic dysfunction given her multiple sclerosis.  Initially Bair hugger was placed and subsequent discontinued with resolution of hypothermia.  Otherwise hemodynamically stable hypertensive, heart rate and respiratory within good range.   Chronic dyspnea Obstructive sleep apnea Morbid obesity - Continue BiPAP at bedtime   Chronic anemia History of GI bleed Stable H&H 8.6 and 29.  Baseline hemoglobin around 7-10.  Continue to monitor.  Patient denies any hematemesis or melena. -- Hgb 8.6>8.1>7.9 - Continue ferrous sulfate  325  mg p.o. daily -- CBC in a.m.   History of MS with spastic quadriplegia Bedbound and wheelchair-bound status Follows with  neurology outpatient, Dr. Vear.  - Continue baclofen  10 mg 3 times daily for management of spasticity.   -- Continue teriflunomide  once daily.   Chronic thrombocytopenia, stable - Platelet count 93>108>96 which is around baseline.   -- CBC daily   Hypothyroidism - Continue levothyroxine  75 mcg PO at bedtime (patient refuses to take in the morning)   Generalized anxiety disorder - Continue Seroquel  25 mg p.o. nightly, trileptal  100 g p.o. 3 times daily, and Prozac  40 mg p.o. twice daily.   Essential hypertension -- Labetalol  50 mg p.o. daily - Holding Lasix  in the setting of AKI.    History of ulcerative colitis - Continue mesalamine  500 mg p.o. 3 times daily   DVT prophylaxis: SCDs Start: 04/11/24 2041 Place TED hose Start: 04/11/24 2041    Code Status: Full Code Family Communication: Updated spouse present at bedside this morning  Disposition Plan:  Level of care: Telemetry Status is: Inpatient Remains inpatient appropriate because: IV antibiotics    Consultants:  None  Procedures:  None  Antimicrobials:  Ceftriaxone  8/29>>   Subjective: Patient seen and examined at bedside, lying in bed.  Spouse present at bedside.  Remains normothermic.  Spouse reports continued hallucinations overnight, although slowly improving.  Creatinine down to 1.85, within baseline.  No questions, concerns or complaints at this time.  Denies headache, no visual changes, no chest pain, no shortness of breath, abdominal pain, no fever/chills/night sweats.  No acute events overnight per nursing staff.  Objective: Vitals:   04/13/24 0135 04/13/24 0200 04/13/24 0544 04/13/24 0556  BP: (!) 179/85 (!) 152/80 (!) 180/83 139/67  Pulse: 90  89 89  Resp: 20  20 16   Temp: (!) 97.4 F (36.3 C)  (!) 97.5 F (36.4 C)   TempSrc: Oral  Oral   SpO2: 100%  93%   Weight:      Height:        Intake/Output Summary (Last 24 hours) at 04/13/2024 1149 Last data filed at 04/13/2024 9082 Gross per 24  hour  Intake 1664.1 ml  Output 1650 ml  Net 14.1 ml   Filed Weights   04/12/24 1300  Weight: 81.6 kg    Examination:  Physical Exam: GEN: NAD, alert and oriented x 3, chronically ill appearance HEENT: NCAT, sclera clear, MMM PULM: CTAB w/o wheezes/crackles, normal respiratory effort, on room air CV: RRR w/o M/G/R GI: abd soft, NTND, + BS MSK: no peripheral edema PSYCH: normal mood/affect Integumentary: No concerning rashes/lesions/wounds noted exposed skin surfaces    Data Reviewed: I have personally reviewed following labs and imaging studies  CBC: Recent Labs  Lab 04/07/24 1346 04/11/24 1615 04/12/24 0527 04/13/24 0640  WBC 7.1 6.9 6.9 7.4  NEUTROABS 5.5  --   --   --   HGB 7.3* 8.6* 8.1* 7.9*  HCT 22.6* 29.3* 28.2* 27.5*  MCV 101* 109.3* 108.9* 109.1*  PLT 77* 97* 108* 96*   Basic Metabolic Panel: Recent Labs  Lab 04/11/24 1615 04/12/24 0527 04/13/24 0640  NA 145 147* 147*  K 5.1 5.2* 4.6  CL 110 113* 114*  CO2 21* 22 20*  GLUCOSE 86 99 85  BUN 49* 44* 37*  CREATININE 2.05* 2.00* 1.85*  CALCIUM  9.4 9.1 9.1   GFR: Estimated Creatinine Clearance: 32.4 mL/min (A) (by C-G formula based on SCr of 1.85 mg/dL (H)). Liver Function Tests: Recent Labs  Lab 04/11/24 1615  AST 22  ALT 24  ALKPHOS 162*  BILITOT 0.3  PROT 6.1*  ALBUMIN 3.8   No results for input(s): LIPASE, AMYLASE in the last 168 hours. No results for input(s): AMMONIA in the last 168 hours. Coagulation Profile: No results for input(s): INR, PROTIME in the last 168 hours. Cardiac Enzymes: No results for input(s): CKTOTAL, CKMB, CKMBINDEX, TROPONINI in the last 168 hours. BNP (last 3 results) No results for input(s): PROBNP in the last 8760 hours. HbA1C: No results for input(s): HGBA1C in the last 72 hours. CBG: Recent Labs  Lab 04/12/24 0618 04/12/24 1725 04/12/24 2327 04/13/24 0537 04/13/24 0542  GLUCAP 101* 97 91 42* 83   Lipid Profile: No results  for input(s): CHOL, HDL, LDLCALC, TRIG, CHOLHDL, LDLDIRECT in the last 72 hours. Thyroid  Function Tests: Recent Labs    04/13/24 0640  TSH 4.040  FREET4 0.60*   Anemia Panel: No results for input(s): VITAMINB12, FOLATE, FERRITIN, TIBC, IRON , RETICCTPCT in the last 72 hours. Sepsis Labs: No results for input(s): PROCALCITON, LATICACIDVEN in the last 168 hours.  Recent Results (from the past 240 hours)  Urine Culture (for pregnant, neutropenic or urologic patients or patients with an indwelling urinary catheter)     Status: None   Collection Time: 04/11/24 10:05 PM   Specimen: Urine, Clean Catch  Result Value Ref Range Status   Specimen Description   Final    URINE, CLEAN CATCH Performed at [facility], [address], [place], [place] [address]    Special Requests   Final    Immunocompromised Performed at [facility], [address] [place], [place] [address]    Culture   Final    NO GROWTH Performed at [facility], [address], [place], [place] [address]    Report Status 04/13/2024 FINAL  Final  MRSA Next Gen by PCR, Nasal     Status: None   Collection Time: 04/12/24  2:15 PM   Specimen: Nasal Mucosa; Nasal Swab  Result Value Ref Range Status   MRSA by PCR Next Gen NOT DETECTED NOT DETECTED Final    Comment: (NOTE) The GeneXpert MRSA Assay (FDA approved for NASAL specimens only), is one component of a comprehensive MRSA colonization surveillance program. It is not intended to diagnose MRSA infection nor to guide or monitor treatment for MRSA infections. Test performance is not FDA approved in patients less than 57 years old. Performed at [facility], [address] [place], [place] [address]          Radiology Studies: [facility] Chest Port 1 View Result Date: 04/11/2024 CLINICAL DATA:  Shortness of breath EXAM: PORTABLE CHEST 1 VIEW COMPARISON:  Chest x-ray 08/21/2023  FINDINGS: Right chest port catheter tip ends in the SVC. Cardiomediastinal silhouette is stable, the heart is mildly enlarged. There is no focal lung infiltrate, pleural effusion or pneumothorax. No acute fractures are seen. IMPRESSION: No active disease. Mild cardiomegaly. Electronically Signed   By: Greig Pique M.D.   On: 04/11/2024 17:59        Scheduled Meds:  baclofen   10 mg Oral TID   Chlorhexidine  Gluconate Cloth  6 each Topical Daily   famotidine   40 mg Oral BID   ferrous sulfate   325 mg Oral Q breakfast   fesoterodine   4 mg Oral Daily   FLUoxetine   40 mg Oral BID   folic acid   0.5 mg Oral Daily   labetalol   50 mg  Oral Daily   levothyroxine   75 mcg Oral QHS   mesalamine   500 mg Oral TID   multivitamin with minerals  1 tablet Oral Daily   OXcarbazepine   150 mg Oral TID   QUEtiapine   25 mg Oral QHS   rosuvastatin   20 mg Oral QHS   sodium chloride  flush  10-40 mL Intracatheter Q12H   sodium chloride  flush  3 mL Intravenous Q12H   Teriflunomide   1 tablet Oral Daily   Continuous Infusions:  cefTRIAXone  (ROCEPHIN )  IV 1 g (04/13/24 0911)   lactated ringers  75 mL/hr at 04/13/24 0857     LOS: 2 days    Time spent: 50 minutes spent on 04/13/2024 caring for this patient face-to-face including chart review, ordering labs/tests, documenting, discussion with nursing staff, consultants, updating family and interview/physical exam    Camellia PARAS [place], DO [facility] Available via Epic secure chat 7am-7pm After these hours, please refer to coverage provider listed on [email or web address] 04/13/2024, 11:49 AM

## 2024-04-13 NOTE — Progress Notes (Signed)
   04/13/24 2327  BiPAP/CPAP/SIPAP  BiPAP/CPAP/SIPAP Pt Type Adult  BiPAP/CPAP/SIPAP Resmed  Reason BIPAP/CPAP not in use Non-compliant (HUSBAND DECLINED)  Set Rate 12 breaths/min  IPAP 19 cmH20  EPAP 15 cmH2O  Patient Home Machine No  Patient Home Mask Yes  Patient Home Tubing No  BiPAP/CPAP /SiPAP Vitals  Resp 16  MEWS Score/Color  MEWS Score 0  MEWS Score Color Green   Husband declined bipap for patient tonight. They stated the settings were not the same as her home but this RT followed the newest order from the study done in May 2025 of Bipap 19/15 BUR 12. The patient and husband stated the mask (pt owned) gave her trouble overnight and the pressure settings weren't enough. RT educated on hospital mask options and sizes but they declined. Resmed remains at bedside with pt's home mask. She is on room air at this time.

## 2024-04-13 NOTE — Progress Notes (Signed)
   04/13/24 0105  BiPAP/CPAP/SIPAP  $ Non-Invasive Home Ventilator  Initial  BiPAP/CPAP/SIPAP Pt Type Adult  BiPAP/CPAP/SIPAP Resmed  Mask Type Full face mask (pt mask)  Dentures removed? Not applicable  Set Rate 12 breaths/min  IPAP 19 cmH20  EPAP 15 cmH2O  Patient Home Machine No  Patient Home Mask (S)  Yes  Patient Home Tubing No  Auto Titrate No  CPAP/SIPAP surface wiped down Yes  Device Plugged into RED Power Outlet Yes  BiPAP/CPAP /SiPAP Vitals  Resp 15  MEWS Score/Color  MEWS Score 0  MEWS Score Color Green   Pt was setup on resmed unit with the servoair taken out of room. On bipap settings 19/15 set rate of 12 per home settings. Patient is using her home mask per husband. They were educated to reach out to this RT if any issues arise with the settings or machine.

## 2024-04-14 DIAGNOSIS — N189 Chronic kidney disease, unspecified: Secondary | ICD-10-CM | POA: Diagnosis not present

## 2024-04-14 DIAGNOSIS — N179 Acute kidney failure, unspecified: Secondary | ICD-10-CM | POA: Diagnosis not present

## 2024-04-14 LAB — BASIC METABOLIC PANEL WITH GFR
Anion gap: 12 (ref 5–15)
BUN: 31 mg/dL — ABNORMAL HIGH (ref 6–20)
CO2: 19 mmol/L — ABNORMAL LOW (ref 22–32)
Calcium: 9.7 mg/dL (ref 8.9–10.3)
Chloride: 112 mmol/L — ABNORMAL HIGH (ref 98–111)
Creatinine, Ser: 1.6 mg/dL — ABNORMAL HIGH (ref 0.44–1.00)
GFR, Estimated: 37 mL/min — ABNORMAL LOW (ref >60)
Glucose, Bld: 112 mg/dL — ABNORMAL HIGH (ref 70–99)
Potassium: 4.6 mmol/L (ref 3.5–5.1)
Sodium: 143 mmol/L (ref 135–145)

## 2024-04-14 LAB — GLUCOSE, CAPILLARY
Glucose-Capillary: 124 mg/dL — ABNORMAL HIGH (ref 70–99)
Glucose-Capillary: 130 mg/dL — ABNORMAL HIGH (ref 70–99)

## 2024-04-14 MED ORDER — CEFADROXIL 500 MG PO CAPS: 500.0000 mg | ORAL_CAPSULE | Freq: Two times a day (BID) | ORAL | 0 refills | Status: DC

## 2024-04-14 MED ORDER — MELATONIN 5 MG PO TABS
5.0000 mg | ORAL_TABLET | Freq: Once | ORAL | Status: AC
  Administered 2024-04-14: 5 mg via ORAL
  Filled 2024-04-14: qty 1

## 2024-04-14 MED ORDER — HEPARIN SOD (PORK) LOCK FLUSH 100 UNIT/ML IV SOLN
500.0000 [IU] | INTRAVENOUS | Status: AC | PRN
  Administered 2024-04-14: 500 [IU]

## 2024-04-14 NOTE — Plan of Care (Signed)
  Problem: Clinical Measurements: Goal: Ability to maintain clinical measurements within normal limits will improve Outcome: Progressing Goal: Diagnostic test results will improve Outcome: Progressing Goal: Respiratory complications will improve Outcome: Progressing Goal: Cardiovascular complication will be avoided Outcome: Progressing   Problem: Safety: Goal: Ability to remain free from injury will improve Outcome: Progressing   Problem: Skin Integrity: Goal: Risk for impaired skin integrity will decrease Outcome: Progressing

## 2024-04-14 NOTE — Progress Notes (Signed)
 Patient discharged: Home with family  Via: Wheelchair   Discharge paperwork given: to patient and family  Reviewed with teach back by SWOT RN   telemetry disconnected/ port de-accessed  Belongings given to patient

## 2024-04-14 NOTE — Discharge Summary (Signed)
 Physician Discharge Summary  JACQUELYN SHADRICK FMW:[id number] DOB: [birth year] DOA: 04/11/2024  PCP: [name], [name]  Admit date: 04/11/2024 Discharge date: 04/14/2024  Admitted From: Home Disposition: Home  Recommendations for Outpatient Follow-up:  Follow up with PCP in 1-2 weeks Continue with antibiotics with cefadroxil  500 mg p.o. twice daily to complete 7-day course for UTI Please obtain CBC/BMP 1 week to ensure renal function remained stable and reassess hemoglobin  Home Health: No Equipment/Devices: None  Discharge Condition: Stable CODE STATUS: Full code Diet recommendation: Heart healthy diet  History of present illness:  Vanessa Short is a 58 y.o. 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 Patient reported that she has increased weakness and shortness of breath.   Patient's primary care physician advised her husband to bring her to the emergency department for further evaluation and possible blood transfusion. Patient has required blood transfusions in the past. Patient has had a GI evaluation which did not show any signs of GI bleed.  Patient denies any nausea, vomiting, hematemesis, and melena.   Patient with poor functional status at baseline.  Patient denies any abdominal pain, nausea, vomiting, hematemesis and melena.  Her been at the bedside very anxious and concerned about patient's hemoglobin and informed him multiple times that currently hemoglobin is stable 8.6 no need for blood transfusion  unless patient develops active GI bleed or become hemodynamically unstable with low hemoglobin.  He is also concerned about patient's renal function as patient has multiple insult of acute kidney injury in the past.   In the ED, temperature 93.9 F, HR 62, RR 16, BP 142/65, SpO2 100% on room air.  WBC 6.9, hemoglobin 8.6, platelet count 97.  Sodium 145, potassium 5.1, chloride 110, CO2 21, glucose 86, BUN 49, creatinine 2.05 (baseline 1.4 - 1.9).  AST 22, ALT 24, total bilirubin 0.3.  Urinalysis with large leukocytes, negative nitrite, rare bacteria, greater than 50 WBCs.  Chest x-ray with mild cardiomegaly, otherwise no other acute cardiopulmonary disease process. In the ED patient received 1 L of LR bolus and ceftriaxone  2 g. Hospitalist has been consulted for further evaluation management of hypothermia, acute kidney injury, acute cystitis and chronic anemia.   Hospital course:  Acute kidney injury superimposed CKD stage IIIa Patient follows with nephrology outpatient.  Baseline creatinine between 1.4-1.9.  Creatinine slightly elevated 2.05 on admission.  Supported with IV fluid hydration with improvement of creatinine to 1.60 at time of discharge.  May resume home furosemide  on discharge.  Recommend repeat BMP 1 week.   Acute cystitis History of urinary incontinence UA shows evidence of UTI.  Patient has chronic urinary incontinence.  Denies any flank pain, fever and chill.  Denies any nausea vomiting.  Urine culture showed no growth, although obtained 2 hours after the initiation of IV antibiotics.  Patient was started on IV ceftriaxone  and will continue cefdinir  on discharge to complete 7-day antibiotic course.   Hypothermia: Resolved In the ED temperature has been checked total 3 times and it is consistent around 93 to 94 F.  Possibly patient has baseline hypothermia from bedbound status, poor mobility; and likely  component of her autonomic dysfunction given her multiple sclerosis.  Initially  Bair hugger was placed and subsequent discontinued with resolution of hypothermia.  Otherwise hemodynamically stable hypertensive, heart rate and respiratory within good range.   Chronic dyspnea Obstructive sleep apnea Morbid obesity Continue BiPAP at bedtime   Chronic anemia History of GI bleed Stable H&H 8.6 and 29.  Baseline hemoglobin around 7-10.  Continue to monitor.  Patient denies any hematemesis or melena.  Hemoglobin 7.9 at time of discharge, stable. Continue ferrous sulfate  325 mg p.o. daily.  Recommend repeat CBC 1 week.   History of MS with spastic quadriplegia Bedbound and wheelchair-bound status Follows with neurology outpatient, Dr. Vear.  Continue baclofen , dantrolene , teriflunomide  once daily.   Chronic thrombocytopenia, stable Platelet count 96, stable   Hypothyroidism Continue levothyroxine  75 mcg PO at bedtime (patient refuses to take in the morning)   Generalized anxiety disorder Continue Seroquel  25 mg p.o. nightly, trileptal  100 g p.o. 3 times daily, and Prozac  40 mg p.o. twice daily.   Essential hypertension Continue labetalol , resume home Lasix  after discharge.   History of ulcerative colitis Continue mesalamine  500 mg p.o. 3 times daily  Discharge Diagnoses:  Principal Problem:   Acute kidney injury superimposed on chronic kidney disease (HCC) Active Problems:   Acute cystitis   Hypothermia   Obstructive sleep apnea   Chronic anemia   Ulcerative colitis (HCC)   Essential hypertension   Wheelchair dependence   Anxiety with depression   History of GI bleed   Spastic paresis (HCC)   Thrombocytopenia (HCC)   Hypothyroidism   Reactive airway disease   Acute kidney injury superimposed on stage 3a chronic kidney disease (HCC)   Acute kidney injury [facility])    Discharge Instructions  Discharge Instructions     Call MD for:  difficulty breathing, headache or visual disturbances   Complete by: As directed    Call MD for:  extreme fatigue    Complete by: As directed    Call MD for:  persistant dizziness or light-headedness   Complete by: As directed    Call MD for:  persistant nausea and vomiting   Complete by: As directed    Call MD for:  severe uncontrolled pain   Complete by: As directed    Call MD for:  temperature >100.4   Complete by: As directed    Diet - low sodium heart healthy   Complete by: As directed    Discharge wound care:   Complete by: As directed    Cleanse sacrum/ buttocks with soap and water, dry and apply silicone foam to area.  Lift daily to assess.   Increase activity slowly   Complete by: As directed       Allergies as of 04/14/2024       Reactions   Penicillins Dermatitis, Rash, Other (See Comments)   Cannot take pure penicillin   Nitrofurantoin Other (See Comments)   Syncope   Oxycodone -acetaminophen  Other (See Comments)   Hallucinations   Risperidone And Paliperidone Other (See Comments)   Became very lethargic        Medication List     TAKE these medications    acyclovir  400 MG tablet Commonly known as: ZOVIRAX  Take 400 mg by mouth in the morning.   aspirin  EC 81 MG tablet Take 81 mg by mouth in the morning. Swallow whole.   b complex vitamins tablet Take 1 tablet by mouth daily.   baclofen  10 MG tablet Commonly known as: LIORESAL  Take  1 tablet (10 mg total) by mouth 4 (four) times daily.   calcitRIOL  0.25 MCG capsule Commonly known as: ROCALTROL  Take 0.25 mcg by mouth in the morning.   cefadroxil  500 MG capsule Commonly known as: DURICEF Take 1 capsule (500 mg total) by mouth 2 (two) times daily for 3 days. Start taking on: April 15, 2024   dantrolene  50 MG capsule Commonly known as: DANTRIUM  Take 1 capsule (50 mg total) by mouth 3 (three) times daily as needed. What changed:  when to take this additional instructions   famotidine  40 MG tablet Commonly known as: PEPCID  Take 40 mg by mouth See admin instructions. Take 40 mg by mouth in the morning and  at 4 PM   ferrous sulfate  325 (65 FE) MG tablet Take 325 mg by mouth daily with breakfast.   FLUoxetine  40 MG capsule Commonly known as: PROZAC  Take 40 mg by mouth in the morning and at bedtime.   folic acid  400 MCG tablet Commonly known as: FOLVITE  Take 400 mcg by mouth in the morning.   furosemide  40 MG tablet Commonly known as: LASIX  Take 0.5 tablets (20 mg total) by mouth 2 (two) times daily. What changed:  when to take this additional instructions   labetalol  100 MG tablet Commonly known as: NORMODYNE  TAKE 1/2 TABLET BY MOUTH DAILY What changed: when to take this   levothyroxine  75 MCG tablet Commonly known as: SYNTHROID  Take 75 mcg by mouth at bedtime.   lidocaine -prilocaine  cream Commonly known as: EMLA  Apply one inch before port-a cath access prn What changed:  how much to take how to take this when to take this reasons to take this additional instructions   mesalamine  0.375 g 24 hr capsule Commonly known as: APRISO  Take 375 mg by mouth 4 (four) times daily.   multivitamin tablet Take 1 tablet by mouth daily with breakfast.   OXcarbazepine  150 MG tablet Commonly known as: TRILEPTAL  Take 1 tablet (150 mg total) by mouth 3 (three) times daily. What changed:  when to take this additional instructions   potassium chloride  10 MEQ tablet Commonly known as: KLOR-CON  Take 2 tablets (20 mEq total) by mouth daily. Take 4 pills daily for 3 days then continue taking as before What changed:  how much to take when to take this additional instructions   QUEtiapine  25 MG tablet Commonly known as: SEROQUEL  One po qHS and one po every day prn What changed:  how much to take how to take this when to take this additional instructions   rosuvastatin  20 MG tablet Commonly known as: CRESTOR  Take 20 mg by mouth at bedtime.   sodium bicarbonate 650 MG tablet Take 650-1,300 mg by mouth See admin instructions. Take 1,300 mg by mouth with breakfast and 650 mg at  4 PM   sodium chloride  0.65 % Soln nasal spray Commonly known as: OCEAN Place 1 spray into both nostrils as needed for congestion.   Teriflunomide  14 MG Tabs Take 1 tablet (14 mg total) by mouth daily. What changed: when to take this   tolterodine  2 MG tablet Commonly known as: DETROL  Take 2 mg by mouth See admin instructions. Take 2 mg by mouth in the morning and at 4 PM   Vitamin D  50 MCG (2000 UT) Caps Take 2,000 Units by mouth in the morning.               Discharge Care Instructions  (From admission, onward)  Start     Ordered   04/14/24 0000  Discharge wound care:       Comments: Cleanse sacrum/ buttocks with soap and water, dry and apply silicone foam to area.  Lift daily to assess.   04/14/24 9083            Follow-up Information     [name], [name]. Schedule an appointment as soon as possible for a visit in 1 week(s).   Specialty: Family Medicine Contact information: [address] [place] [address] [phone]                Allergies  Allergen Reactions   Penicillins Dermatitis, Rash and Other (See Comments)    Cannot take pure penicillin   Nitrofurantoin Other (See Comments)    Syncope    Oxycodone -Acetaminophen  Other (See Comments)    Hallucinations   Risperidone And Paliperidone Other (See Comments)    Became very lethargic    Consultations: None   Procedures/Studies: DG Chest Port 1 View Result Date: 04/11/2024 CLINICAL DATA:  Shortness of breath EXAM: PORTABLE CHEST 1 VIEW COMPARISON:  Chest x-ray 08/21/2023 FINDINGS: Right chest port catheter tip ends in the SVC. Cardiomediastinal silhouette is stable, the heart is mildly enlarged. There is no focal lung infiltrate, pleural effusion or pneumothorax. No acute fractures are seen. IMPRESSION: No active disease. Mild cardiomegaly. Electronically Signed   By: Greig Pique M.D.   On: 04/11/2024 17:59     Subjective: Patient seen examined  bedside, lying in bed.  Husband present at bedside.  Anxious.  Urine culture showed no growth yesterday although review of EMR indicates initiation of antibiotics 2 hours prior to obtaining urine analysis/culture.  Discussed with husband at bedside.  Discharging home on cefdinir .  Concerned about continued hallucinations overnight, discussed with patient and husband likely complicated by some hospital-acquired delirium and patient will likely be less anxious being in her home environment.  No other questions or concerns at this time.  Denies headache, no dizziness, no chest pain, no palpitations, no shortness of breath, no abdominal pain, no fever/chills, no nausea/vomitus or diarrhea.  No acute events overnight per nursing staff.  Discharge Exam: Vitals:   04/14/24 0522 04/14/24 0904  BP: (!) 152/94   Pulse: (!) 103   Resp: 16   Temp: 98.4 F (36.9 C) 98.1 F (36.7 C)  SpO2: 90%    Vitals:   04/13/24 1954 04/13/24 2327 04/14/24 0522 04/14/24 0904  BP: (!) 150/85  (!) 152/94   Pulse: 86  (!) 103   Resp: 20 16 16    Temp: 97.6 F (36.4 C)  98.4 F (36.9 C) 98.1 F (36.7 C)  TempSrc:    Oral  SpO2: 94%  90%   Weight:      Height:        Physical Exam: GEN: NAD, alert and oriented x 3, chronically ill appearance HEENT: NCAT, sclera clear, MMM PULM: CTAB w/o wheezes/crackles, normal respiratory effort, on room air CV: RRR w/o M/G/R GI: abd soft, NTND, + BS MSK: no peripheral edema PSYCH: normal mood/affect Integumentary: No concerning rashes/lesions/wounds noted exposed skin surfaces    The results of significant diagnostics from this hospitalization (including imaging, microbiology, ancillary and laboratory) are listed below for reference.     Microbiology: Recent Results (from the past 240 hours)  Urine Culture (for pregnant, neutropenic or urologic patients or patients with an indwelling urinary catheter)     Status: None   Collection Time: 04/11/24 10:05 PM  Specimen:  Urine, Clean Catch  Result Value Ref Range Status   Specimen Description   Final    URINE, CLEAN CATCH Performed at [facility], [address], [place], [place] [address]    Special Requests   Final    Immunocompromised Performed at [facility], [address] [place], [place] [address]    Culture   Final    NO GROWTH Performed at [facility], [address], [place], [place] [address]    Report Status 04/13/2024 FINAL  Final  MRSA Next Gen by PCR, Nasal     Status: None   Collection Time: 04/12/24  2:15 PM   Specimen: Nasal Mucosa; Nasal Swab  Result Value Ref Range Status   MRSA by PCR Next Gen NOT DETECTED NOT DETECTED Final    Comment: (NOTE) The GeneXpert MRSA Assay (FDA approved for NASAL specimens only), is one component of a comprehensive MRSA colonization surveillance program. It is not intended to diagnose MRSA infection nor to guide or monitor treatment for MRSA infections. Test performance is not FDA approved in patients less than 67 years old. Performed at [facility], [address], [place], [place] [address]      Labs: BNP (last 3 results) No results for input(s): BNP in the last 8760 hours. Basic Metabolic Panel: Recent Labs  Lab 04/11/24 1615 04/12/24 0527 04/13/24 0640 04/14/24 0625  NA 145 147* 147* 143  K 5.1 5.2* 4.6 4.6  CL 110 113* 114* 112*  CO2 21* 22 20* 19*  GLUCOSE 86 99 85 112*  BUN 49* 44* 37* 31*  CREATININE 2.05* 2.00* 1.85* 1.60*  CALCIUM  9.4 9.1 9.1 9.7   Liver Function Tests: Recent Labs  Lab 04/11/24 1615  AST 22  ALT 24  ALKPHOS 162*  BILITOT 0.3  PROT 6.1*  ALBUMIN 3.8   No results for input(s): LIPASE, AMYLASE in the last 168 hours. No results for input(s): AMMONIA in the last 168 hours. CBC: Recent Labs  Lab 04/07/24 1346 04/11/24 1615 04/12/24 0527 04/13/24 0640  WBC 7.1 6.9 6.9 7.4  NEUTROABS 5.5  --   --   --   HGB 7.3* 8.6*  8.1* 7.9*  HCT 22.6* 29.3* 28.2* 27.5*  MCV 101* 109.3* 108.9* 109.1*  PLT 77* 97* 108* 96*   Cardiac Enzymes: No results for input(s): CKTOTAL, CKMB, CKMBINDEX, TROPONINI in the last 168 hours. BNP: Invalid input(s): POCBNP CBG: Recent Labs  Lab 04/13/24 0542 04/13/24 1206 04/13/24 1705 04/14/24 0002 04/14/24 0522  GLUCAP 83 180* 113* 130* 124*   D-Dimer No results for input(s): DDIMER in the last 72 hours. Hgb A1c No results for input(s): HGBA1C in the last 72 hours. Lipid Profile No results for input(s): CHOL, HDL, LDLCALC, TRIG, CHOLHDL, LDLDIRECT in the last 72 hours. Thyroid  function studies Recent Labs    04/13/24 0640  TSH 4.040   Anemia work up No results for input(s): VITAMINB12, FOLATE, FERRITIN, TIBC, IRON , RETICCTPCT in the last 72 hours. Urinalysis    Component Value Date/Time   COLORURINE YELLOW 04/11/2024 1835   APPEARANCEUR CLOUDY (A) 04/11/2024 1835   LABSPEC 1.011 04/11/2024 1835   PHURINE 7.0 04/11/2024 1835   GLUCOSEU NEGATIVE 04/11/2024 1835   HGBUR LARGE (A) 04/11/2024 1835   BILIRUBINUR NEGATIVE 04/11/2024 1835   KETONESUR NEGATIVE 04/11/2024 1835   PROTEINUR 30 (A) 04/11/2024 1835   UROBILINOGEN 0.2 05/31/2013 0237   NITRITE NEGATIVE 04/11/2024 1835   LEUKOCYTESUR LARGE (A)  04/11/2024 1835   Sepsis Labs Recent Labs  Lab 04/07/24 1346 04/11/24 1615 04/12/24 0527 04/13/24 0640  WBC 7.1 6.9 6.9 7.4   Microbiology Recent Results (from the past 240 hours)  Urine Culture (for pregnant, neutropenic or urologic patients or patients with an indwelling urinary catheter)     Status: None   Collection Time: 04/11/24 10:05 PM   Specimen: Urine, Clean Catch  Result Value Ref Range Status   Specimen Description   Final    URINE, CLEAN CATCH Performed at [facility], [address], [place], [place] [address]    Special Requests   Final    Immunocompromised Performed at [facility], [address], [place], [place] [address]    Culture   Final    NO GROWTH Performed at [facility], [address] [place], [place] [address]    Report Status 04/13/2024 FINAL  Final  MRSA Next Gen by PCR, Nasal     Status: None   Collection Time: 04/12/24  2:15 PM   Specimen: Nasal Mucosa; Nasal Swab  Result Value Ref Range Status   MRSA by PCR Next Gen NOT DETECTED NOT DETECTED Final    Comment: (NOTE) The GeneXpert MRSA Assay (FDA approved for NASAL specimens only), is one component of a comprehensive MRSA colonization surveillance program. It is not intended to diagnose MRSA infection nor to guide or monitor treatment for MRSA infections. Test performance is not FDA approved in patients less than 21 years old. Performed at [facility], [address] [place], [place] [address]      Time coordinating discharge: Over 30 minutes  SIGNED:   Camellia PARAS [place], DO  [facility] 04/14/2024, 9:16 AM

## 2024-04-15 ENCOUNTER — Emergency Department (HOSPITAL_COMMUNITY)

## 2024-04-15 ENCOUNTER — Inpatient Hospital Stay (HOSPITAL_COMMUNITY)
Admission: EM | Admit: 2024-04-15 | Discharge: 2024-04-18 | DRG: 917 | Disposition: A | Discharge status: Home / Self Care | Attending: Internal Medicine | Admitting: Internal Medicine

## 2024-04-15 ENCOUNTER — Encounter (HOSPITAL_COMMUNITY): Payer: Self-pay

## 2024-04-15 ENCOUNTER — Other Ambulatory Visit: Payer: Self-pay

## 2024-04-15 DIAGNOSIS — G9340 Encephalopathy, unspecified: Secondary | ICD-10-CM | POA: Diagnosis present

## 2024-04-15 DIAGNOSIS — Z833 Family history of diabetes mellitus: Secondary | ICD-10-CM | POA: Diagnosis not present

## 2024-04-15 DIAGNOSIS — F411 Generalized anxiety disorder: Secondary | ICD-10-CM | POA: Diagnosis present

## 2024-04-15 DIAGNOSIS — G35 Multiple sclerosis: Secondary | ICD-10-CM | POA: Diagnosis present

## 2024-04-15 DIAGNOSIS — Z79899 Other long term (current) drug therapy: Secondary | ICD-10-CM

## 2024-04-15 DIAGNOSIS — Z7401 Bed confinement status: Secondary | ICD-10-CM | POA: Diagnosis not present

## 2024-04-15 DIAGNOSIS — E785 Hyperlipidemia, unspecified: Secondary | ICD-10-CM | POA: Diagnosis present

## 2024-04-15 DIAGNOSIS — E860 Dehydration: Secondary | ICD-10-CM | POA: Diagnosis present

## 2024-04-15 DIAGNOSIS — I129 Hypertensive chronic kidney disease with stage 1 through stage 4 chronic kidney disease, or unspecified chronic kidney disease: Secondary | ICD-10-CM | POA: Diagnosis present

## 2024-04-15 DIAGNOSIS — Z6832 Body mass index (BMI) 32.0-32.9, adult: Secondary | ICD-10-CM

## 2024-04-15 DIAGNOSIS — G928 Other toxic encephalopathy: Secondary | ICD-10-CM | POA: Diagnosis present

## 2024-04-15 DIAGNOSIS — D696 Thrombocytopenia, unspecified: Secondary | ICD-10-CM | POA: Diagnosis present

## 2024-04-15 DIAGNOSIS — Z885 Allergy status to narcotic agent status: Secondary | ICD-10-CM | POA: Diagnosis not present

## 2024-04-15 DIAGNOSIS — F32A Depression, unspecified: Secondary | ICD-10-CM | POA: Diagnosis present

## 2024-04-15 DIAGNOSIS — Z79890 Hormone replacement therapy (postmenopausal): Secondary | ICD-10-CM

## 2024-04-15 DIAGNOSIS — Z880 Allergy status to penicillin: Secondary | ICD-10-CM

## 2024-04-15 DIAGNOSIS — E66811 Obesity, class 1: Secondary | ICD-10-CM | POA: Diagnosis present

## 2024-04-15 DIAGNOSIS — Z888 Allergy status to other drugs, medicaments and biological substances status: Secondary | ICD-10-CM

## 2024-04-15 DIAGNOSIS — N3000 Acute cystitis without hematuria: Secondary | ICD-10-CM | POA: Diagnosis not present

## 2024-04-15 DIAGNOSIS — A6009 Herpesviral infection of other urogenital tract: Secondary | ICD-10-CM | POA: Diagnosis present

## 2024-04-15 DIAGNOSIS — K5190 Ulcerative colitis, unspecified, without complications: Secondary | ICD-10-CM | POA: Diagnosis present

## 2024-04-15 DIAGNOSIS — E039 Hypothyroidism, unspecified: Secondary | ICD-10-CM | POA: Diagnosis present

## 2024-04-15 DIAGNOSIS — T50991A Poisoning by other drugs, medicaments and biological substances, accidental (unintentional), initial encounter: Principal | ICD-10-CM | POA: Diagnosis present

## 2024-04-15 DIAGNOSIS — Z993 Dependence on wheelchair: Secondary | ICD-10-CM | POA: Diagnosis not present

## 2024-04-15 DIAGNOSIS — Z83438 Family history of other disorder of lipoprotein metabolism and other lipidemia: Secondary | ICD-10-CM

## 2024-04-15 DIAGNOSIS — G4733 Obstructive sleep apnea (adult) (pediatric): Secondary | ICD-10-CM | POA: Diagnosis present

## 2024-04-15 DIAGNOSIS — N1832 Chronic kidney disease, stage 3b: Secondary | ICD-10-CM | POA: Diagnosis present

## 2024-04-15 DIAGNOSIS — Z8249 Family history of ischemic heart disease and other diseases of the circulatory system: Secondary | ICD-10-CM

## 2024-04-15 DIAGNOSIS — T50995A Adverse effect of other drugs, medicaments and biological substances, initial encounter: Secondary | ICD-10-CM | POA: Diagnosis present

## 2024-04-15 DIAGNOSIS — R443 Hallucinations, unspecified: Secondary | ICD-10-CM | POA: Diagnosis present

## 2024-04-15 DIAGNOSIS — D631 Anemia in chronic kidney disease: Secondary | ICD-10-CM | POA: Diagnosis present

## 2024-04-15 DIAGNOSIS — B002 Herpesviral gingivostomatitis and pharyngotonsillitis: Secondary | ICD-10-CM | POA: Diagnosis present

## 2024-04-15 DIAGNOSIS — Z7982 Long term (current) use of aspirin: Secondary | ICD-10-CM

## 2024-04-15 LAB — CBC WITH DIFFERENTIAL/PLATELET
Abs Immature Granulocytes: 0.09 K/uL — ABNORMAL HIGH (ref 0.00–0.07)
Basophils Absolute: 0.1 K/uL (ref 0.0–0.1)
Basophils Relative: 1 %
Eosinophils Absolute: 0.3 K/uL (ref 0.0–0.5)
Eosinophils Relative: 2 %
HCT: 29.2 % — ABNORMAL LOW (ref 36.0–46.0)
Hemoglobin: 8.8 g/dL — ABNORMAL LOW (ref 12.0–15.0)
Immature Granulocytes: 1 %
Lymphocytes Relative: 5 %
Lymphs Abs: 0.7 K/uL (ref 0.7–4.0)
MCH: 32.4 pg (ref 26.0–34.0)
MCHC: 30.1 g/dL (ref 30.0–36.0)
MCV: 107.4 fL — ABNORMAL HIGH (ref 80.0–100.0)
Monocytes Absolute: 1.7 K/uL — ABNORMAL HIGH (ref 0.1–1.0)
Monocytes Relative: 14 %
Neutro Abs: 9.7 K/uL — ABNORMAL HIGH (ref 1.7–7.7)
Neutrophils Relative %: 77 %
Platelets: 101 K/uL — ABNORMAL LOW (ref 150–400)
RBC: 2.72 MIL/uL — ABNORMAL LOW (ref 3.87–5.11)
RDW: 15.7 % — ABNORMAL HIGH (ref 11.5–15.5)
WBC: 12.5 K/uL — ABNORMAL HIGH (ref 4.0–10.5)
nRBC: 0 % (ref 0.0–0.2)

## 2024-04-15 LAB — COMPREHENSIVE METABOLIC PANEL WITH GFR
ALT: 25 U/L (ref 0–44)
AST: 27 U/L (ref 15–41)
Albumin: 3.3 g/dL — ABNORMAL LOW (ref 3.5–5.0)
Alkaline Phosphatase: 113 U/L (ref 38–126)
Anion gap: 12 (ref 5–15)
BUN: 31 mg/dL — ABNORMAL HIGH (ref 6–20)
CO2: 19 mmol/L — ABNORMAL LOW (ref 22–32)
Calcium: 9.2 mg/dL (ref 8.9–10.3)
Chloride: 115 mmol/L — ABNORMAL HIGH (ref 98–111)
Creatinine, Ser: 1.99 mg/dL — ABNORMAL HIGH (ref 0.44–1.00)
GFR, Estimated: 28 mL/min — ABNORMAL LOW (ref >60)
Glucose, Bld: 120 mg/dL — ABNORMAL HIGH (ref 70–99)
Potassium: 4.3 mmol/L (ref 3.5–5.1)
Sodium: 146 mmol/L — ABNORMAL HIGH (ref 135–145)
Total Bilirubin: 0.7 mg/dL (ref 0.0–1.2)
Total Protein: 6 g/dL — ABNORMAL LOW (ref 6.5–8.1)

## 2024-04-15 LAB — URINALYSIS, W/ REFLEX TO CULTURE (INFECTION SUSPECTED)
Bilirubin Urine: NEGATIVE
Glucose, UA: 50 mg/dL — AB
Ketones, ur: 5 mg/dL — AB
Nitrite: NEGATIVE
Protein, ur: >=300 mg/dL — AB
RBC / HPF: >50 RBC/hpf (ref 0–5)
Specific Gravity, Urine: 1.014 (ref 1.005–1.030)
WBC, UA: >50 WBC/hpf (ref 0–5)
pH: 6 (ref 5.0–8.0)

## 2024-04-15 LAB — I-STAT CG4 LACTIC ACID, ED: Lactic Acid, Venous: 0.7 mmol/L (ref 0.5–1.9)

## 2024-04-15 LAB — PROTIME-INR
INR: 1.1 (ref 0.8–1.2)
Prothrombin Time: 15 s (ref 11.4–15.2)

## 2024-04-15 LAB — PROCALCITONIN: Procalcitonin: 0.23 ng/mL

## 2024-04-15 LAB — HIV ANTIBODY (ROUTINE TESTING W REFLEX): HIV Screen 4th Generation wRfx: NONREACTIVE

## 2024-04-15 MED ORDER — ROSUVASTATIN CALCIUM 20 MG PO TABS
20.0000 mg | ORAL_TABLET | Freq: Every day | ORAL | Status: DC
  Administered 2024-04-15 – 2024-04-17 (×3): 20 mg via ORAL
  Filled 2024-04-15 (×3): qty 1

## 2024-04-15 MED ORDER — SODIUM CHLORIDE 0.9 % IV SOLN: INTRAVENOUS | Status: DC

## 2024-04-15 MED ORDER — ASPIRIN 81 MG PO TBEC
81.0000 mg | DELAYED_RELEASE_TABLET | Freq: Every morning | ORAL | Status: DC
  Administered 2024-04-15 – 2024-04-16 (×2): 81 mg via ORAL
  Filled 2024-04-15 (×2): qty 1

## 2024-04-15 MED ORDER — LEVOTHYROXINE SODIUM 75 MCG PO TABS
75.0000 ug | ORAL_TABLET | Freq: Every day | ORAL | Status: DC
  Administered 2024-04-15 – 2024-04-17 (×3): 75 ug via ORAL
  Filled 2024-04-15 (×3): qty 1

## 2024-04-15 MED ORDER — TERIFLUNOMIDE 14 MG PO TABS: 1.0000 | ORAL_TABLET | Freq: Every day | ORAL | Status: DC

## 2024-04-15 MED ORDER — CHLORHEXIDINE GLUCONATE CLOTH 2 % EX PADS
6.0000 | MEDICATED_PAD | Freq: Every day | CUTANEOUS | Status: DC
  Administered 2024-04-15 – 2024-04-18 (×4): 6 via TOPICAL

## 2024-04-15 MED ORDER — FLUOXETINE HCL 20 MG PO CAPS
40.0000 mg | ORAL_CAPSULE | Freq: Every day | ORAL | Status: DC
  Administered 2024-04-15 – 2024-04-18 (×4): 40 mg via ORAL
  Filled 2024-04-15 (×4): qty 2

## 2024-04-15 MED ORDER — CALCITRIOL 0.25 MCG PO CAPS
0.2500 ug | ORAL_CAPSULE | Freq: Every morning | ORAL | Status: DC
  Administered 2024-04-15: 0.25 ug via ORAL
  Filled 2024-04-15: qty 1

## 2024-04-15 MED ORDER — LIDOCAINE-PRILOCAINE 2.5-2.5 % EX CREA
TOPICAL_CREAM | Freq: Once | CUTANEOUS | Status: DC
  Filled 2024-04-15: qty 5

## 2024-04-15 MED ORDER — LABETALOL HCL 100 MG PO TABS
50.0000 mg | ORAL_TABLET | Freq: Every day | ORAL | Status: DC
Start: 2024-04-15 — End: 2024-04-18
  Administered 2024-04-15 – 2024-04-18 (×4): 50 mg via ORAL
  Filled 2024-04-15 (×4): qty 0.5

## 2024-04-15 MED ORDER — SODIUM CHLORIDE 0.9 % IV SOLN
1.0000 g | Freq: Two times a day (BID) | INTRAVENOUS | Status: DC
  Administered 2024-04-15 – 2024-04-16 (×3): 1 g via INTRAVENOUS
  Filled 2024-04-15: qty 1
  Filled 2024-04-15 (×3): qty 10

## 2024-04-15 MED ORDER — LORAZEPAM 1 MG PO TABS
1.0000 mg | ORAL_TABLET | Freq: Once | ORAL | Status: AC
  Administered 2024-04-15: 1 mg via ORAL
  Filled 2024-04-15: qty 1

## 2024-04-15 MED ORDER — DEXTROSE-SODIUM CHLORIDE 5-0.45 % IV SOLN: INTRAVENOUS | Status: AC

## 2024-04-15 MED ORDER — SODIUM CHLORIDE 0.9 % IV SOLN: 1.0000 g | Freq: Once | INTRAVENOUS | Status: DC

## 2024-04-15 MED ORDER — FAMOTIDINE 20 MG PO TABS
40.0000 mg | ORAL_TABLET | Freq: Two times a day (BID) | ORAL | Status: DC
  Administered 2024-04-15 – 2024-04-18 (×7): 40 mg via ORAL
  Filled 2024-04-15 (×7): qty 2

## 2024-04-15 MED ORDER — OXCARBAZEPINE 150 MG PO TABS
150.0000 mg | ORAL_TABLET | Freq: Three times a day (TID) | ORAL | Status: DC
  Administered 2024-04-15 – 2024-04-18 (×8): 150 mg via ORAL
  Filled 2024-04-15 (×9): qty 1

## 2024-04-15 MED ORDER — MESALAMINE ER 0.375 G PO CP24: 0.3750 g | ORAL_CAPSULE | Freq: Four times a day (QID) | ORAL | Status: DC

## 2024-04-15 MED ORDER — ENOXAPARIN SODIUM 40 MG/0.4ML IJ SOSY
40.0000 mg | PREFILLED_SYRINGE | Freq: Every day | INTRAMUSCULAR | Status: DC
  Administered 2024-04-15 – 2024-04-16 (×2): 40 mg via SUBCUTANEOUS
  Filled 2024-04-15 (×2): qty 0.4

## 2024-04-15 MED ORDER — FUROSEMIDE 20 MG PO TABS
20.0000 mg | ORAL_TABLET | Freq: Two times a day (BID) | ORAL | Status: DC
  Administered 2024-04-15: 20 mg via ORAL
  Filled 2024-04-15 (×2): qty 1

## 2024-04-15 MED ORDER — QUETIAPINE FUMARATE 25 MG PO TABS
25.0000 mg | ORAL_TABLET | Freq: Every day | ORAL | Status: DC
  Administered 2024-04-15 – 2024-04-17 (×3): 25 mg via ORAL
  Filled 2024-04-15 (×3): qty 1

## 2024-04-15 NOTE — ED Provider Notes (Signed)
 [facility] DEPT [facility] Emergency Department Provider Note MRN:  [id number]  Arrival date & time: 04/15/24     Chief Complaint   Hallucinations   History of Present Illness   Vanessa Short is a 58 y.o. year-old female presents to the ED with chief complaint of hallucinations.  Patient was just released from [name] after having been admitted for her husband's acute on chronic CKD and UTI.  the patient states that she has been seeing ghosts and they have been on her walls and talking to her.  Her husband states that she only hallucinates like this when she has an infection.  He states that she has had subjective fever since being discharged home.  She denies cough or shortness of breath.  Both husband and patient states that if there is no medical cause for the hallucinations, they would like to be evaluated by psychiatry.  History provided by patient.   Review of Systems  Pertinent positive and negative review of systems noted in HPI.    Physical Exam   Vitals:   04/15/24 0101 04/15/24 0332  BP: (!) 158/106 124/68  Pulse: 93 97  Resp: 18 18  Temp: 98.8 F (37.1 C) 99.5 F (37.5 C)  SpO2: 95% 98%    CONSTITUTIONAL:  non toxic-appearing, NAD NEURO:  Alert and oriented x 3, CN 3-12 grossly intact EYES:  eyes equal and reactive ENT/NECK:  Supple, no stridor  CARDIO:  normal rate, regular rhythm, appears well-perfused  PULM:  No respiratory distress, CTAB GI/GU:  non-distended, no focal tenderness MSK/SPINE:  No gross deformities, no edema, moves all extremities  SKIN:  no rash, atraumatic   *Additional and/or pertinent findings included in MDM below  Diagnostic and Interventional Summary    EKG Interpretation Date/Time:  Tuesday April 15 2024 03:31:51 EDT Ventricular Rate:  99 PR Interval:  183 QRS Duration:  90 QT Interval:  375 QTC Calculation: 482 R Axis:   26  Text Interpretation: Sinus rhythm Consider left atrial enlargement When  compared with ECG of 04/11/2024, No significant change was found Confirmed by Raford Lenis ([id number]) on 04/15/2024 4:45:11 AM       Labs Reviewed  CBC WITH DIFFERENTIAL/PLATELET - Abnormal; Notable for the following components:      Result Value   WBC 12.5 (*)    RBC 2.72 (*)    Hemoglobin 8.8 (*)    HCT 29.2 (*)    MCV 107.4 (*)    RDW 15.7 (*)    Platelets 101 (*)    Neutro Abs 9.7 (*)    Monocytes Absolute 1.7 (*)    Abs Immature Granulocytes 0.09 (*)    All other components within normal limits  COMPREHENSIVE METABOLIC PANEL WITH GFR - Abnormal; Notable for the following components:   Sodium 146 (*)    Chloride 115 (*)    CO2 19 (*)    Glucose, Bld 120 (*)    BUN 31 (*)    Creatinine, Ser 1.99 (*)    Total Protein 6.0 (*)    Albumin 3.3 (*)    GFR, Estimated 28 (*)    All other components within normal limits  URINALYSIS, W/ REFLEX TO CULTURE (INFECTION SUSPECTED) - Abnormal; Notable for the following components:   APPearance CLOUDY (*)    Glucose, UA 50 (*)    Hgb urine dipstick LARGE (*)    Ketones, ur 5 (*)    Protein, ur >=300 (*)    Leukocytes,Ua MODERATE (*)  Bacteria, UA RARE (*)    Non Squamous Epithelial 0-5 (*)    All other components within normal limits  CULTURE, BLOOD (ROUTINE X 2)  CULTURE, BLOOD (ROUTINE X 2)  URINE CULTURE  PROTIME-INR  I-STAT CG4 LACTIC ACID, ED  CBG MONITORING, ED  I-STAT CG4 LACTIC ACID, ED    CT HEAD WO CONTRAST ( )  Final Result    DG Chest Port 1 View  Final Result      Medications  dextrose  5 % and 0.45 % NaCl infusion (has no administration in time range)  ceFEPIme  (MAXIPIME ) 1 g in sodium chloride  0.9 % 100 mL IVPB (has no administration in time range)     Procedures  /  Critical Care Procedures  ED Course and Medical Decision Making  I have reviewed the triage vital signs, the nursing notes, and pertinent available records from the EMR.  Social Determinants Affecting Complexity of Care: Patient has no  clinically significant social determinants affecting this chief complaint..   ED Course:    Medical Decision Making Patient here after having been just discharged from [facility] for a UTI and acute on chronic kidney disease.  She has been hallucinating at home.  Husband states that this is normally only in the case when she is fighting an infection.  Mild leukocytosis to 12.5, this is increased from when she was discharged.  Lactic is normal.  Creatinine is slightly increased from discharge as well.  I discussed the case with Dr. Keturah, who recommends changing antibiotic to cefepime  and giving gentle fluids with D5 half-normal saline.  Patient will be a carryover to the morning team.  Amount and/or Complexity of Data Reviewed Labs: ordered. Radiology: ordered. ECG/medicine tests: ordered.  Risk Prescription drug management. Decision regarding hospitalization.         Consultants: I consulted with Hospitalist, Dr. Segars, who is appreciated for admitting.   Treatment and Plan: Patient's exam and diagnostic results are concerning for UTI.  Feel that patient will need admission to the hospital for further treatment and evaluation.    Final Clinical Impressions(s) / ED Diagnoses     ICD-10-CM   1. Acute cystitis without hematuria  N30.00     2. Hallucinations  R44.3       ED Discharge Orders     None         Discharge Instructions Discussed with and Provided to Patient:   Discharge Instructions   None      Vicky Charleston, [name] 04/15/24 9394    Midge Golas, MD 04/15/24 [phone]

## 2024-04-15 NOTE — Progress Notes (Signed)
 PT Cancellation Note  Patient Details Name: Vanessa Short MRN: [id number] DOB: January 13, 1966   Cancelled Treatment:    Reason Eval/Treat Not Completed: PT screened, no needs identified, will sign off. Patient has MS. She is wheelchair/bed bound at baseline. Met her and caregiver, she has all equipment needed at this time to include lift. Signing off.    Dorthy Hustead 04/15/2024, 2:04 PM

## 2024-04-15 NOTE — ED Triage Notes (Signed)
 PT arrived from home via POV. Recently discharged from [facility]. Pt is having hallucinations per husband. Pt is seeing things on wall that are not there and talking to people who are also not present. The pt states she feels like she is constantly moving. She states that she is unable to eat because she is getting motion sick.

## 2024-04-15 NOTE — ED Notes (Signed)
 Pt taken from triage to room by pt's motorized wheelchair. Pt's husband stated she needed a lift assist device for pt to move from chair to bed.

## 2024-04-15 NOTE — ED Notes (Signed)
 Notified provider, Georgina MD of family request to speak to provider.

## 2024-04-15 NOTE — ED Notes (Signed)
 Only one BC site able to be obtained due to difficult Vascular access.

## 2024-04-15 NOTE — ED Notes (Signed)
Patient is clean and dry at this time

## 2024-04-15 NOTE — ED Notes (Signed)
 IV team at bedside

## 2024-04-15 NOTE — Plan of Care (Signed)

## 2024-04-15 NOTE — ED Notes (Addendum)
 Report given to inpatient unit. ED NT transporting patient.

## 2024-04-15 NOTE — H&P (Addendum)
 History and Physical    [name] DOB: 1966/06/05 DOA: 04/15/2024 DOS: the patient was seen and examined on 04/15/2024 PCP: [name], [name]  Patient coming from: Home  Chief Complaint:  Chief Complaint  Patient presents with   Hallucinations   HPI: Vanessa Short is a 58 y.o. 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  Pt is unable to provide medical history. HPI obtained from husband, Burnard, via phone. Per Burnard, pt presented with confusion that began four to five days ago with hallucinations. Approximately a week and a half ago, an aide noted that the patient was seeing things, which was the initial indicator of the problem; as such, she was evaluated by her PCP who checked a urine sample given c/f infection and recommended ED evaluation because of her low Hb of 7.9. The patient was treated at [facility] for a suspected urinary tract infection, and was discharged home on oral abx despite the admission urine culture being negative. While home and despite being on abx, the hallucinations intensified, leading to the current hospital visit. Of note, the patient had not changed medications recently.  In the ED, pt tachycardic, and tachypneic w/o hypoxia. Labs notable for Cr 1.99 (labile baseline, but previously 1.6 the day prior). UA positive for LE/rare bacteria, and urine culture pending. EDP started IV cefepime  and requested medicine admission.  Review of Systems: As mentioned in the history of present illness. All other systems reviewed and are  negative. Past Medical History:  Diagnosis Date   Headache    Herpes genitalis in women    History of kidney stones    Hypertension    Kidney stones    Movement disorder    Multiple sclerosis (HCC)    Neuropathy    Oral herpes    Ulcerative colitis (HCC)    Vision abnormalities    Past Surgical History:  Procedure Laterality Date   BIOPSY  08/25/2023   Procedure: BIOPSY;  Surgeon: Saintclair Jasper, MD;  Location: [facility];  Service: Gastroenterology;;   ENDOMETRIAL ABLATION  10/10/2017   ESOPHAGOGASTRODUODENOSCOPY (EGD) WITH PROPOFOL  Left 08/25/2023   Procedure: ESOPHAGOGASTRODUODENOSCOPY (EGD) WITH PROPOFOL ;  Surgeon: Saintclair Jasper, MD;  Location: [facility];  Service: Gastroenterology;  Laterality: Left;   KIDNEY STONE SURGERY     PORTACATH PLACEMENT N/A 10/16/2017   Procedure: ULTRA SOUND GUIDED INSERTION PORT-A-CATH ERAS PATHWAY;  Surgeon: Stevie Herlene Righter, MD;  Location: [facility];  Service: General;  Laterality: N/A;   Social History:  reports that she has never smoked. She has never used smokeless tobacco. She reports current alcohol use. She reports that she does not use drugs.  Allergies  Allergen Reactions   Penicillins Dermatitis, Rash and Other (See Comments)    Cannot take pure penicillin   Nitrofurantoin Other (See Comments)    Syncope    Oxycodone -Acetaminophen  Other (See Comments)    Hallucinations   Risperidone And Paliperidone Other (See Comments)    Became very lethargic    Family History  Problem Relation Age of Onset   Dementia Mother    Hypertension Father    Hyperlipidemia Father    Diabetes Father    Heart disease Father    Breast  cancer Neg Hx     Prior to Admission medications   Medication Sig Start Date End Date Taking? Authorizing Provider  acyclovir  (ZOVIRAX ) 400 MG tablet Take 400 mg by mouth in the morning. 08/11/14   [provider]  aspirin  EC 81 MG tablet Take 81 mg by mouth in the morning. Swallow whole.    [provider]  b complex vitamins tablet Take 1 tablet by mouth daily.     [provider]  baclofen  (LIORESAL ) 10 MG tablet Take 1 tablet (10 mg total) by mouth 4 (four) times daily. 04/08/24   Sater, Charlie LABOR, MD  calcitRIOL  (ROCALTROL ) 0.25 MCG capsule Take 0.25 mcg by mouth in the morning.    [provider]  cefadroxil  (DURICEF) 500 MG capsule Take 1 capsule (500 mg total) by mouth 2 (two) times daily for 3 days. 04/15/24 04/18/24  [place], Camellia PARAS, DO  Cholecalciferol  (VITAMIN D ) 50 MCG (2000 UT) CAPS Take 2,000 Units by mouth in the morning.    [provider]  dantrolene  (DANTRIUM ) 50 MG capsule Take 1 capsule (50 mg total) by mouth 3 (three) times daily as needed. Patient taking differently: Take 50 mg by mouth See admin instructions. Take 50 mg by mouth with breakfast, at 12 NOON, and at 4 PM 08/22/23   Sater, Charlie LABOR, MD  famotidine  (PEPCID ) 40 MG tablet Take 40 mg by mouth See admin instructions. Take 40 mg by mouth in the morning and at 4 PM 02/28/23   [provider]  ferrous sulfate  325 (65 FE) MG tablet Take 325 mg by mouth daily with breakfast. 08/02/23   [provider]  FLUoxetine  (PROZAC ) 40 MG capsule Take 40 mg by mouth in the morning and at bedtime.    [provider]  folic acid  (FOLVITE ) 400 MCG tablet Take 400 mcg by mouth in the morning.    [provider]  furosemide  (LASIX ) 40 MG tablet Take 0.5 tablets (20 mg total) by mouth 2 (two) times daily. Patient taking differently: Take 20 mg by mouth See admin instructions. Take 20 mg by mouth in the morning and with lunch 04/25/23   Briana Elgin LABOR, MD  labetalol  (NORMODYNE ) 100 MG tablet TAKE 1/2 TABLET BY MOUTH DAILY Patient taking differently: Take 50 mg by mouth in the morning. 01/10/23   Sater, Charlie LABOR, MD  levothyroxine  (SYNTHROID ) 75 MCG tablet Take 75 mcg by mouth at bedtime.    [provider]  lidocaine -prilocaine  (EMLA ) cream Apply one inch before  port-a cath access prn Patient taking differently: Apply 1 Application topically daily as needed (port access). 03/23/21   Sater, Charlie LABOR, MD  mesalamine  (APRISO ) 0.375 g 24 hr capsule Take 375 mg by mouth 4 (four) times daily.    [provider]  Multiple Vitamin (MULTIVITAMIN) tablet Take 1 tablet by mouth daily with breakfast.    [provider]  OXcarbazepine  (TRILEPTAL ) 150 MG tablet Take 1 tablet (150 mg total) by mouth 3 (three) times daily. Patient taking differently: Take 150 mg by mouth See admin instructions. Take 150 mg by mouth with breakfast, at 12 NOON, and at 4 PM 08/22/23   Sater, Charlie LABOR, MD  potassium chloride  (KLOR-CON ) 10 MEQ tablet Take 2 tablets (20 mEq total) by mouth daily. Take 4 pills daily for 3 days then continue taking as before Patient taking differently: Take 10 mEq by mouth in the morning. 04/22/23   Jillian Buttery, MD  QUEtiapine  (SEROQUEL ) 25 MG tablet  One po qHS and one po every day prn Patient taking differently: Take 25 mg by mouth at bedtime. 02/12/23   Sater, Charlie LABOR, MD  rosuvastatin  (CRESTOR ) 20 MG tablet Take 20 mg by mouth at bedtime.    [provider]  sodium bicarbonate 650 MG tablet Take 650-1,300 mg by mouth See admin instructions. Take 1,300 mg by mouth with breakfast and 650 mg at 4 PM    [provider]  sodium chloride  (OCEAN) 0.65 % SOLN nasal spray Place 1 spray into both nostrils as needed for congestion. 08/19/16   Rai, Ripudeep K, MD  Teriflunomide  14 MG TABS Take 1 tablet (14 mg total) by mouth daily. Patient taking differently: Take 14 mg by mouth in the morning. 02/18/24   Sater, Charlie LABOR, MD  tolterodine  (DETROL ) 2 MG tablet Take 2 mg by mouth See admin instructions. Take 2 mg by mouth in the morning and at 4 PM 05/09/18   [provider]    Physical Exam: Vitals:   04/15/24 0515 04/15/24 0530 04/15/24 0545 04/15/24 0600  BP: (!) 142/72 130/66 134/82 (!) 145/79  Pulse: (!) 104 (!) 107 (!) 102  (!) 103  Resp: 16 (!) 22 18 18   Temp:      TempSrc:      SpO2: 94% 93% 95% 96%  Weight:      Height:       General: Alert, oriented x3, resting comfortably in no acute distress Respiratory: Lungs clear to auscultation bilaterally with normal respiratory effort; no w/r/r Cardiovascular: Regular rate and rhythm w/o m/r/g   Data Reviewed:  Lab Results  Component Value Date   WBC 12.5 (H) 04/15/2024   HGB 8.8 (L) 04/15/2024   HCT 29.2 (L) 04/15/2024   MCV 107.4 (H) 04/15/2024   PLT 101 (L) 04/15/2024   Lab Results  Component Value Date   GLUCOSE 120 (H) 04/15/2024   CALCIUM  9.2 04/15/2024   NA 146 (H) 04/15/2024   K 4.3 04/15/2024   CO2 19 (L) 04/15/2024   CL 115 (H) 04/15/2024   BUN 31 (H) 04/15/2024   CREATININE 1.99 (H) 04/15/2024   Lab Results  Component Value Date   ALT 25 04/15/2024   AST 27 04/15/2024   GGT 119 (H) 02/01/2023   ALKPHOS 113 04/15/2024   BILITOT 0.7 04/15/2024   Lab Results  Component Value Date   INR 1.1 04/15/2024   INR 1.2 08/21/2023   INR 0.98 09/29/2016   Radiology: CT HEAD WO CONTRAST ( ) Result Date: 04/15/2024 CLINICAL DATA:  Mental status changes. EXAM: CT HEAD WITHOUT CONTRAST TECHNIQUE: Contiguous axial images were obtained from the base of the skull through the vertex without intravenous contrast. RADIATION DOSE REDUCTION: This exam was performed according to the departmental dose-optimization program which includes automated exposure control, adjustment of the mA and/or kV according to patient size and/or use of iterative reconstruction technique. COMPARISON:  04/24/2023 FINDINGS: Brain: There is no evidence for acute hemorrhage, hydrocephalus, mass lesion, or abnormal extra-axial fluid collection. No definite CT evidence for acute infarction. Diffuse loss of parenchymal volume is consistent with atrophy. Patchy low attenuation in the deep hemispheric and periventricular white matter is nonspecific, but likely reflects chronic  microvascular ischemic demyelination. Vascular: No hyperdense vessel or unexpected calcification. Skull: No evidence for fracture. No worrisome lytic or sclerotic lesion. Sinuses/Orbits: The visualized paranasal sinuses and mastoid air cells are clear. Visualized portions of the globes and intraorbital fat are unremarkable. Other: None. IMPRESSION: 1. No acute intracranial  abnormality. 2. Atrophy with chronic small vessel ischemic disease. Electronically Signed   By: Camellia Candle M.D.   On: 04/15/2024 05:58   DG Chest Port 1 View Result Date: 04/15/2024 CLINICAL DATA:  Possible sepsis EXAM: PORTABLE CHEST 1 VIEW COMPARISON:  04/11/2024 FINDINGS: Cardiac shadow is enlarged but stable. Aortic calcifications are noted. Right-sided chest wall port is again seen. Patchy atelectatic changes are noted in the bases left greater than right. No bony abnormality is noted. IMPRESSION: Patchy bibasilar atelectatic changes. Electronically Signed   By: Oneil Devonshire M.D.   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  AKI Cr 1.99; previously 1.6 yesterday; labile Cr in Epic -MIVF: NS at 75cc/h for 24h -Strict I&Os and daily weights (standing preferred) -F/u BMP daily -Renally dose medications for CrCl -Avoid lovenox , NSAIDs, morphine, Fleet's phosphate enema, regular insulin, contrast; no gadolinium for MRI to avoid nephrogenic systemic fibrosis -Consider renal US  and nephrology consult if worsening AKI  Acute encephalopathy High suspicion for overmedication with neurogenic medications, but will need to  exclude infection; OSH urine culture from 8/29 showed no growth -IV cefepime  per pharmacy for now -F/u urine culture and d/c abx if neg -F/u blood cultures -F/u procalcitonin -Reduce sedating neurogenic medications per below  H/o MS -HOLD pta sedating medications (baclofen , dantrolene ) given known compounding interactions that cause sedation/hallucinations in hopes pt will clear mentally -Continue pta teriflunomide  -Consider Neurology consult to determine which meds/dosing pt should be resume assuming infectious w/u above is unyielding; follows OP w/ Dr. Vear  HTN -PTA labetalol  -HOLD pta lasix  and consider dose reduction at d/c (PO lasix  20mg  daily rather than current BID)  UC -PTA mesalamine  500mg  TID  Hypothyroid -PTA 75mcg daily  GAD -PTA Trileptal , Prozac , and Seroquel ; may need to considering titrating these meds, namely seroquel  and trileptal  pending ongoing confusion   Advance Care Planning:   Code Status: Full Code   Consults: N/A  Family Communication: Husband  Severity of Illness: The appropriate patient status for this patient is INPATIENT. Inpatient status is judged to be reasonable and necessary in order to provide the required intensity of service to ensure the patient's safety. The patient's presenting symptoms, physical exam findings, and initial radiographic and laboratory data in the context of their chronic comorbidities is felt to place them at high risk for further clinical deterioration. Furthermore, it is not anticipated that the patient will be medically stable for discharge from the hospital within 2 midnights of admission.   * I certify that at the point of admission it is my clinical judgment that the patient will require inpatient hospital care spanning beyond 2 midnights from the point of admission due to high intensity of service, high risk for further deterioration and high frequency of surveillance required.*   ------- I spent 65 minutes  reviewing previous notes, at the bedside counseling/discussing the treatment plan, and performing clinical documentation.  Author: Marsha Ada, MD 04/15/2024 7:32 AM  For on call review [email or web address].

## 2024-04-16 DIAGNOSIS — N3000 Acute cystitis without hematuria: Secondary | ICD-10-CM | POA: Diagnosis not present

## 2024-04-16 DIAGNOSIS — R443 Hallucinations, unspecified: Secondary | ICD-10-CM

## 2024-04-16 DIAGNOSIS — G9340 Encephalopathy, unspecified: Secondary | ICD-10-CM | POA: Diagnosis not present

## 2024-04-16 LAB — URINE CULTURE: Culture: NO GROWTH

## 2024-04-16 LAB — BASIC METABOLIC PANEL WITH GFR
Anion gap: 10 (ref 5–15)
BUN: 30 mg/dL — ABNORMAL HIGH (ref 6–20)
CO2: 19 mmol/L — ABNORMAL LOW (ref 22–32)
Calcium: 8.2 mg/dL — ABNORMAL LOW (ref 8.9–10.3)
Chloride: 116 mmol/L — ABNORMAL HIGH (ref 98–111)
Creatinine, Ser: 1.98 mg/dL — ABNORMAL HIGH (ref 0.44–1.00)
GFR, Estimated: 29 mL/min — ABNORMAL LOW (ref >60)
Glucose, Bld: 91 mg/dL (ref 70–99)
Potassium: 4.1 mmol/L (ref 3.5–5.1)
Sodium: 145 mmol/L (ref 135–145)

## 2024-04-16 LAB — CBC
HCT: 23.3 % — ABNORMAL LOW (ref 36.0–46.0)
Hemoglobin: 7.1 g/dL — ABNORMAL LOW (ref 12.0–15.0)
MCH: 33.5 pg (ref 26.0–34.0)
MCHC: 30.5 g/dL (ref 30.0–36.0)
MCV: 109.9 fL — ABNORMAL HIGH (ref 80.0–100.0)
Platelets: 85 K/uL — ABNORMAL LOW (ref 150–400)
RBC: 2.12 MIL/uL — ABNORMAL LOW (ref 3.87–5.11)
RDW: 15.8 % — ABNORMAL HIGH (ref 11.5–15.5)
WBC: 7.7 K/uL (ref 4.0–10.5)
nRBC: 0 % (ref 0.0–0.2)

## 2024-04-16 MED ORDER — BACLOFEN 5 MG HALF TABLET
5.0000 mg | ORAL_TABLET | Freq: Three times a day (TID) | ORAL | Status: DC
  Administered 2024-04-16 – 2024-04-18 (×7): 5 mg via ORAL
  Filled 2024-04-16 (×7): qty 1

## 2024-04-16 MED ORDER — ORAL CARE MOUTH RINSE: 15.0000 mL | OROMUCOSAL | Status: DC | PRN

## 2024-04-16 MED ORDER — DANTROLENE SODIUM 25 MG PO CAPS
25.0000 mg | ORAL_CAPSULE | ORAL | Status: DC
  Administered 2024-04-16 – 2024-04-17 (×4): 25 mg via ORAL
  Filled 2024-04-16 (×5): qty 1

## 2024-04-16 NOTE — TOC Initial Note (Signed)
 Transition of Care [facility]) - Initial/Assessment Note    Patient Details  Name: Vanessa Short MRN: [id number] Date of Birth: 12/13/1965  Transition of Care [facility]) CM/SW Contact:    Marval Gell, RN Phone Number: 04/16/2024, 8:43 AM  Clinical Narrative:                  Patient admitted from home for hallucinations.  Hx MS, is bed/ WC bound, lives w spouse and has a caregiver.  ICM will continue to follow for transition needs.   Expected Discharge Plan: Home/Self Care     Patient Goals and CMS Choice Patient states their goals for this hospitalization and ongoing recovery are:: return home          Expected Discharge Plan and Services   Discharge Planning Services: CM Consult   Living arrangements for the past 2 months: Single Family Home                                      Prior Living Arrangements/Services Living arrangements for the past 2 months: Single Family Home Lives with:: Spouse                   Activities of Daily Living   ADL Screening (condition at time of admission) Independently performs ADLs?: No Does the patient have a NEW difficulty with bathing/dressing/toileting/self-feeding that is expected to last >3 days?: No Does the patient have a NEW difficulty with getting in/out of bed, walking, or climbing stairs that is expected to last >3 days?: No Does the patient have a NEW difficulty with communication that is expected to last >3 days?: No Is the patient deaf or have difficulty hearing?: No Does the patient have difficulty seeing, even when wearing glasses/contacts?: No Does the patient have difficulty concentrating, remembering, or making decisions?: Yes  Permission Sought/Granted                  Emotional Assessment              Admission diagnosis:  Hallucinations [R44.3] Acute cystitis without hematuria [N30.00] Acute encephalopathy [G93.40] Patient Active Problem List   Diagnosis Date Noted   Hypothermia  04/11/2024   History of GI bleed 04/11/2024   Spastic paresis (HCC) 04/11/2024   Thrombocytopenia (HCC) 04/11/2024   Hypothyroidism 04/11/2024   Reactive airway disease 04/11/2024   Acute kidney injury superimposed on stage 3a chronic kidney disease (HCC) 04/11/2024   Acute kidney injury (HCC) 04/11/2024   Acute cystitis 08/25/2023   Acute cough 08/25/2023   Melena 08/24/2023   Blood loss anemia 08/24/2023   Hypoxia 08/22/2023   AMS (altered mental status) 04/25/2023   Obesity (BMI 30-39.9) 04/25/2023   Acute kidney injury superimposed on chronic kidney disease (HCC) 04/17/2023   Transaminitis 02/01/2023   Acute encephalopathy 01/31/2023   Acute lower UTI 01/31/2023   Anxiety with depression 01/31/2023   Dyslipidemia 01/31/2023   Obstructive sleep apnea 01/31/2023   Delirium due to another medical condition, acute, hyperactive 01/26/2023   MRSA (methicillin resistant Staphylococcus aureus) infection 01/26/2023   Urinary incontinence 05/09/2021   Bedbound 10/27/2020   Lymphopenia 10/27/2020   Port-A-Cath in place 12/22/2019   Left arm weakness 12/16/2018   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    Class: History of   Auditory hallucinations 07/30/2018   Tactile hallucinations 07/30/2018   Hallucinations 07/30/2018  Altered mental status 07/29/2018   Wheelchair dependence 02/27/2017   Chronic diastolic CHF (congestive heart failure) (HCC) 09/30/2016   Prolonged QT interval    High risk medication use 09/27/2016   Hypokalemia 08/05/2016   Chronic anemia 08/05/2016   Slurred speech 08/05/2016   Ulcerative colitis (HCC) 08/05/2016   Essential hypertension 08/05/2016   Subacromial bursitis 05/09/2016   Hallucination, visual 03/14/2016    Class: History of   Urinary disorder 11/09/2015   Hand weakness 01/06/2015   Multiple sclerosis (HCC) 09/07/2014   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   Dysesthesia 09/07/2014   History of  Major depressive disorder, recurrent (HCC) 09/07/2014    Class: History of   PCP:  [name], [name] Pharmacy:   [id number] - [place], [place] - [address] AT [facility] [address] [place] [address] Phone: [phone] Fax: [phone]  [id number] - [place], [place] - [address] [place] [address] Phone: [phone] Fax: [phone]     Social Drivers of Health (SDOH) Social History: SDOH Screenings   Food Insecurity: No Food Insecurity (04/15/2024)  Housing: Low Risk  (04/15/2024)  Transportation Needs: No Transportation Needs (04/15/2024)  Utilities: Not At Risk (04/15/2024)  Social Connections: Moderately Integrated (08/22/2023)  Stress: No Stress Concern Present (09/24/2020)   Received from [facility]  Tobacco Use: Low Risk  (04/15/2024)   SDOH Interventions:     Readmission Risk Interventions    08/24/2023    9:42 AM  Readmission Risk Prevention Plan  Transportation Screening Complete  PCP or Specialist Appt within 3-5 Days Complete  HRI or Home Care Consult Complete  Social Work Consult for Recovery Care Planning/Counseling Complete  Palliative Care Screening Not Applicable  Medication Review Oceanographer) Complete

## 2024-04-16 NOTE — Progress Notes (Signed)
 OT Cancellation Note  Patient Details Name: Vanessa Short MRN: [id number] DOB: 06-25-1966   Cancelled Treatment:    Reason Eval/Treat Not Completed: OT screened, no needs identified, will sign off. Pt is at her baseline in ADLs and mobility.   Kennth Mliss Helling 04/16/2024, 8:06 AM [name], OTR/L Acute Rehabilitation Services Office: [phone]

## 2024-04-16 NOTE — Progress Notes (Signed)
 Pt husband concern about pt not getting her home med Mesalamine . [place], MD notified and new order placed. Per pharmacy, we do not cary that particular dose. Pt husband instructed to bring med from home to be dispensed by pharmacy. Pt husband agreeable.

## 2024-04-16 NOTE — Progress Notes (Signed)
 PROGRESS NOTE        PATIENT DETAILS Name: Vanessa Short Age: 58 y.o. Sex: female Date of Birth: 1966-02-27 Admit Date: 04/15/2024 Admitting Physician Marsha Ada, MD ERE:Xjeojw, Josette ORN., [name]  Brief Summary: Patient is a 58 y.o.  female with multiple sclerosis-presented with altered mental status.  Significant events: 9/2>> admit to [facility]  Significant studies: 9/2>> CXR: Patchy bibasilar atelectasis. 9/2>> CT head: No acute intracranial abnormality.  Significant microbiology data: 9/2>> blood culture: No growth 9/2>> urine culture: No growth  Procedures: None  Consults: None  Subjective: Improved-completely awake and alert-no further hallucinations.  Objective: Vitals: Blood pressure (!) 122/56, pulse 97, temperature 98.8 F (37.1 C), temperature source Oral, resp. rate 14, height 5' 2 (1.575 m), weight 81.6 kg, SpO2 92%.   Exam: Gen Exam:Alert awake-not in any distress HEENT:atraumatic, normocephalic Chest: B/L clear to auscultation anteriorly CVS:S1S2 regular Abdomen:soft non tender, non distended Extremities:no edema Skin: no rash  Pertinent Labs/Radiology:    Latest Ref Rng & Units 04/16/2024    6:56 AM 04/15/2024    4:00 AM 04/13/2024    6:40 AM  CBC  WBC 4.0 - 10.5 K/uL 7.7  12.5  7.4   Hemoglobin 12.0 - 15.0 g/dL 7.1  8.8  7.9   Hematocrit 36.0 - 46.0 % 23.3  29.2  27.5   Platelets 150 - 400 K/uL 85  101  96     Lab Results  Component Value Date   NA 145 04/16/2024   K 4.1 04/16/2024   CL 116 (H) 04/16/2024   CO2 19 (L) 04/16/2024     Assessment/Plan: Acute toxic metabolic encephalopathy Suspect combination of dehydration/polypharmacy (in the setting of CKD) Clinically improved-baclofen /dantrolene  were held on admission-rest of her usual medications were continued No evidence of infection/UTI.  Stop antibiotics.  Note UTI ruled out. Volume status stable-stop IVF Discussed with spouse-and then with  pharmacist-reduce dosage of baclofen /dantrolene  (half of home dose). Watch overnight-if stable-Home likely on 9/4.  CKD stage IIIb At baseline Note-AKI ruled out.  Normocytic anemia Secondary to CKD/chronic disease No evidence of blood loss Slight drop in Hb-likely related to  IV fluid dilution Follow CBC periodically  Thrombocytopenia Seems to be a chronic issue Platelet count close to baseline Follow-up with outpatient MD.  History of multiple sclerosis-bed to wheelchair bound Continue teriflunomide  Initiate baclofen /dantrolene  at half of home dosage and see how she does  Ulcerative colitis Resume mesalamine   Hypothyroidism Synthroid   Mood disorder Continue Trileptal /Prozac /Seroquel -seems to be tolerating these meds well without any issues with encephalopathy.  Class 1 Obesity: Estimated body mass index is 32.9 kg/m as calculated from the following:   Height as of this encounter: 5' 2 (1.575 m).   Weight as of this encounter: 81.6 kg.   Code status:   Code Status: Full Code   DVT Prophylaxis: enoxaparin  (LOVENOX ) injection 40 mg Start: 04/15/24 1000   Family Communication: None at bedside   Disposition Plan: Status is: Inpatient Remains inpatient appropriate because: Severity of illness   Planned Discharge Destination:Home   Diet: Diet Order             Diet regular Room service appropriate? Yes; Fluid consistency: Thin  Diet effective now                     Antimicrobial agents: Anti-infectives (From admission, onward)  Start     Dose/Rate Route Frequency Ordered Stop   04/15/24 0600  ceFEPIme  (MAXIPIME ) 1 g in sodium chloride  0.9 % 100 mL IVPB  Status:  Discontinued        1 g 200 mL/hr over 30 Minutes Intravenous Every 12 hours 04/15/24 0539 04/16/24 1042   04/15/24 0430  cefTRIAXone  (ROCEPHIN ) 1 g in sodium chloride  0.9 % 100 mL IVPB  Status:  Discontinued        1 g 200 mL/hr over 30 Minutes Intravenous  Once 04/15/24 0426  04/15/24 0533        MEDICATIONS: Scheduled Meds:  aspirin  EC  81 mg Oral q AM   baclofen   5 mg Oral TID   Chlorhexidine  Gluconate Cloth  6 each Topical Daily   dantrolene   25 mg Oral 3 times per day   enoxaparin  (LOVENOX ) injection  40 mg Subcutaneous Daily   famotidine   40 mg Oral BID   FLUoxetine   40 mg Oral Daily   labetalol   50 mg Oral Daily   levothyroxine   75 mcg Oral QHS   mesalamine   0.375 g Oral QID   OXcarbazepine   150 mg Oral TID   QUEtiapine   25 mg Oral QHS   rosuvastatin   20 mg Oral QHS   Teriflunomide   1 tablet Oral Daily   Continuous Infusions:  sodium chloride  75 mL/hr at 04/16/24 0444   PRN Meds:.mouth rinse   I have personally reviewed following labs and imaging studies  LABORATORY DATA: CBC: Recent Labs  Lab 04/11/24 1615 04/12/24 0527 04/13/24 0640 04/15/24 0400 04/16/24 0656  WBC 6.9 6.9 7.4 12.5* 7.7  NEUTROABS  --   --   --  9.7*  --   HGB 8.6* 8.1* 7.9* 8.8* 7.1*  HCT 29.3* 28.2* 27.5* 29.2* 23.3*  MCV 109.3* 108.9* 109.1* 107.4* 109.9*  PLT 97* 108* 96* 101* 85*    Basic Metabolic Panel: Recent Labs  Lab 04/12/24 0527 04/13/24 0640 04/14/24 0625 04/15/24 0400 04/16/24 0656  NA 147* 147* 143 146* 145  K 5.2* 4.6 4.6 4.3 4.1  CL 113* 114* 112* 115* 116*  CO2 22 20* 19* 19* 19*  GLUCOSE 99 85 112* 120* 91  BUN 44* 37* 31* 31* 30*  CREATININE 2.00* 1.85* 1.60* 1.99* 1.98*  CALCIUM  9.1 9.1 9.7 9.2 8.2*    GFR: Estimated Creatinine Clearance: 30.3 mL/min (A) (by C-G formula based on SCr of 1.98 mg/dL (H)).  Liver Function Tests: Recent Labs  Lab 04/11/24 1615 04/15/24 0400  AST 22 27  ALT 24 25  ALKPHOS 162* 113  BILITOT 0.3 0.7  PROT 6.1* 6.0*  ALBUMIN 3.8 3.3*   No results for input(s): LIPASE, AMYLASE in the last 168 hours. No results for input(s): AMMONIA in the last 168 hours.  Coagulation Profile: Recent Labs  Lab 04/15/24 0400  INR 1.1    Cardiac Enzymes: No results for input(s): CKTOTAL,  CKMB, CKMBINDEX, TROPONINI in the last 168 hours.  BNP (last 3 results) No results for input(s): PROBNP in the last 8760 hours.  Lipid Profile: No results for input(s): CHOL, HDL, LDLCALC, TRIG, CHOLHDL, LDLDIRECT in the last 72 hours.  Thyroid  Function Tests: No results for input(s): TSH, T4TOTAL, FREET4, T3FREE, THYROIDAB in the last 72 hours.  Anemia Panel: No results for input(s): VITAMINB12, FOLATE, FERRITIN, TIBC, IRON , RETICCTPCT in the last 72 hours.  Urine analysis:    Component Value Date/Time   COLORURINE YELLOW 04/15/2024 0317   APPEARANCEUR CLOUDY (A) 04/15/2024 9682  LABSPEC 1.014 04/15/2024 0317   PHURINE 6.0 04/15/2024 0317   GLUCOSEU 50 (A) 04/15/2024 0317   HGBUR LARGE (A) 04/15/2024 0317   BILIRUBINUR NEGATIVE 04/15/2024 0317   KETONESUR 5 (A) 04/15/2024 0317   PROTEINUR >=300 (A) 04/15/2024 0317   UROBILINOGEN 0.2 05/31/2013 0237   NITRITE NEGATIVE 04/15/2024 0317   LEUKOCYTESUR MODERATE (A) 04/15/2024 0317    Sepsis Labs: Lactic Acid, Venous    Component Value Date/Time   LATICACIDVEN 0.7 04/15/2024 0421    MICROBIOLOGY: Recent Results (from the past 240 hours)  Urine Culture (for pregnant, neutropenic or urologic patients or patients with an indwelling urinary catheter)     Status: None   Collection Time: 04/11/24 10:05 PM   Specimen: Urine, Clean Catch  Result Value Ref Range Status   Specimen Description   Final    URINE, CLEAN CATCH Performed at [facility], [address], [place], [place] [address]    Special Requests   Final    Immunocompromised Performed at [facility], [address], [place], [place] [address]    Culture   Final    NO GROWTH Performed at [facility], [address] [place], [place] [address]    Report Status 04/13/2024 FINAL  Final  MRSA Next Gen by PCR, Nasal     Status: None   Collection Time: 04/12/24  2:15 PM    Specimen: Nasal Mucosa; Nasal Swab  Result Value Ref Range Status   MRSA by PCR Next Gen NOT DETECTED NOT DETECTED Final    Comment: (NOTE) The GeneXpert MRSA Assay (FDA approved for NASAL specimens only), is one component of a comprehensive MRSA colonization surveillance program. It is not intended to diagnose MRSA infection nor to guide or monitor treatment for MRSA infections. Test performance is not FDA approved in patients less than 76 years old. Performed at [facility], [address], [place], [place] [address]   Blood Culture (routine x 2)     Status: None (Preliminary result)   Collection Time: 04/15/24  2:23 AM   Specimen: BLOOD  Result Value Ref Range Status   Specimen Description BLOOD SITE NOT SPECIFIED  Final   Special Requests   Final    BOTTLES DRAWN AEROBIC AND ANAEROBIC Blood Culture adequate volume   Culture   Final    NO GROWTH 1 DAY Performed at [facility], [address], [place], [place] [address]    Report Status PENDING  Incomplete  Urine Culture     Status: None   Collection Time: 04/15/24  3:17 AM   Specimen: Urine, Random  Result Value Ref Range Status   Specimen Description URINE, RANDOM  Final   Special Requests NONE Reflexed from [id number]  Final   Culture   Final    NO GROWTH Performed at [facility], [address] [place], [place] [address]    Report Status 04/16/2024 FINAL  Final  Blood Culture (routine x 2)     Status: None (Preliminary result)   Collection Time: 04/15/24  2:48 PM   Specimen: BLOOD RIGHT HAND  Result Value Ref Range Status   Specimen Description BLOOD RIGHT HAND  Final   Special Requests   Final    BOTTLES DRAWN AEROBIC AND ANAEROBIC Blood Culture adequate volume   Culture   Final    NO GROWTH < 24 HOURS Performed at [facility], [address] [place], [place] [address]  Report Status PENDING  Incomplete    RADIOLOGY STUDIES/RESULTS: CT HEAD WO CONTRAST ( ) Result  Date: 04/15/2024 CLINICAL DATA:  Mental status changes. EXAM: CT HEAD WITHOUT CONTRAST TECHNIQUE: Contiguous axial images were obtained from the base of the skull through the vertex without intravenous contrast. RADIATION DOSE REDUCTION: This exam was performed according to the departmental dose-optimization program which includes automated exposure control, adjustment of the mA and/or kV according to patient size and/or use of iterative reconstruction technique. COMPARISON:  04/24/2023 FINDINGS: Brain: There is no evidence for acute hemorrhage, hydrocephalus, mass lesion, or abnormal extra-axial fluid collection. No definite CT evidence for acute infarction. Diffuse loss of parenchymal volume is consistent with atrophy. Patchy low attenuation in the deep hemispheric and periventricular white matter is nonspecific, but likely reflects chronic microvascular ischemic demyelination. Vascular: No hyperdense vessel or unexpected calcification. Skull: No evidence for fracture. No worrisome lytic or sclerotic lesion. Sinuses/Orbits: The visualized paranasal sinuses and mastoid air cells are clear. Visualized portions of the globes and intraorbital fat are unremarkable. Other: None. IMPRESSION: 1. No acute intracranial abnormality. 2. Atrophy with chronic small vessel ischemic disease. Electronically Signed   By: Camellia Candle M.D.   On: 04/15/2024 05:58   DG Chest Port 1 View Result Date: 04/15/2024 CLINICAL DATA:  Possible sepsis EXAM: PORTABLE CHEST 1 VIEW COMPARISON:  04/11/2024 FINDINGS: Cardiac shadow is enlarged but stable. Aortic calcifications are noted. Right-sided chest wall port is again seen. Patchy atelectatic changes are noted in the bases left greater than right. No bony abnormality is noted. IMPRESSION: Patchy bibasilar atelectatic changes. Electronically Signed   By: Oneil Devonshire M.D.   On: 04/15/2024 02:37     LOS: 1 day   Donalda Applebaum, MD  [facility]    To contact the attending  provider between 7A-7P or the covering provider during after hours 7P-7A, please log into the web site [email or web address] and access using universal [place] password for that web site. If you do not have the password, please call the hospital operator.  04/16/2024, 10:55 AM

## 2024-04-17 ENCOUNTER — Inpatient Hospital Stay (HOSPITAL_COMMUNITY)

## 2024-04-17 ENCOUNTER — Ambulatory Visit

## 2024-04-17 DIAGNOSIS — G9340 Encephalopathy, unspecified: Secondary | ICD-10-CM | POA: Diagnosis not present

## 2024-04-17 DIAGNOSIS — N3000 Acute cystitis without hematuria: Secondary | ICD-10-CM | POA: Diagnosis not present

## 2024-04-17 DIAGNOSIS — R443 Hallucinations, unspecified: Secondary | ICD-10-CM | POA: Diagnosis not present

## 2024-04-17 LAB — IRON AND TIBC
Iron: 41 ug/dL (ref 28–170)
Saturation Ratios: 18 % (ref 10.4–31.8)
TIBC: 224 ug/dL — ABNORMAL LOW (ref 250–450)
UIBC: 183 ug/dL

## 2024-04-17 LAB — BASIC METABOLIC PANEL WITH GFR
Anion gap: 9 (ref 5–15)
BUN: 27 mg/dL — ABNORMAL HIGH (ref 6–20)
CO2: 18 mmol/L — ABNORMAL LOW (ref 22–32)
Calcium: 8.3 mg/dL — ABNORMAL LOW (ref 8.9–10.3)
Chloride: 117 mmol/L — ABNORMAL HIGH (ref 98–111)
Creatinine, Ser: 1.82 mg/dL — ABNORMAL HIGH (ref 0.44–1.00)
GFR, Estimated: 32 mL/min — ABNORMAL LOW (ref >60)
Glucose, Bld: 127 mg/dL — ABNORMAL HIGH (ref 70–99)
Potassium: 3.7 mmol/L (ref 3.5–5.1)
Sodium: 144 mmol/L (ref 135–145)

## 2024-04-17 LAB — CBC
HCT: 21 % — ABNORMAL LOW (ref 36.0–46.0)
HCT: 23.7 % — ABNORMAL LOW (ref 36.0–46.0)
Hemoglobin: 6.2 g/dL — CL (ref 12.0–15.0)
Hemoglobin: 7.1 g/dL — ABNORMAL LOW (ref 12.0–15.0)
MCH: 32.8 pg (ref 26.0–34.0)
MCH: 32.3 pg (ref 26.0–34.0)
MCHC: 29.5 g/dL — ABNORMAL LOW (ref 30.0–36.0)
MCHC: 30 g/dL (ref 30.0–36.0)
MCV: 111.1 fL — ABNORMAL HIGH (ref 80.0–100.0)
MCV: 107.7 fL — ABNORMAL HIGH (ref 80.0–100.0)
Platelets: 67 K/uL — ABNORMAL LOW (ref 150–400)
Platelets: 76 K/uL — ABNORMAL LOW (ref 150–400)
RBC: 1.89 MIL/uL — ABNORMAL LOW (ref 3.87–5.11)
RBC: 2.2 MIL/uL — ABNORMAL LOW (ref 3.87–5.11)
RDW: 15.9 % — ABNORMAL HIGH (ref 11.5–15.5)
RDW: 16.2 % — ABNORMAL HIGH (ref 11.5–15.5)
WBC: 7.4 K/uL (ref 4.0–10.5)
WBC: 8.1 K/uL (ref 4.0–10.5)
nRBC: 0 % (ref 0.0–0.2)
nRBC: 0 % (ref 0.0–0.2)

## 2024-04-17 LAB — TRANSFUSION REACTION
DAT C3: NEGATIVE
Post RXN DAT IgG: NEGATIVE

## 2024-04-17 LAB — FERRITIN: Ferritin: 182 ng/mL (ref 11–307)

## 2024-04-17 LAB — VITAMIN B12: Vitamin B-12: 793 pg/mL (ref 180–914)

## 2024-04-17 LAB — URINALYSIS, COMPLETE (UACMP) WITH MICROSCOPIC
Bilirubin Urine: NEGATIVE
Glucose, UA: NEGATIVE mg/dL
Ketones, ur: NEGATIVE mg/dL
Nitrite: NEGATIVE
Protein, ur: 100 mg/dL — AB
Specific Gravity, Urine: 1.02 (ref 1.005–1.030)
WBC, UA: >50 WBC/hpf (ref 0–5)
pH: 6 (ref 5.0–8.0)

## 2024-04-17 LAB — RETICULOCYTES
Immature Retic Fract: 26.7 % — ABNORMAL HIGH (ref 2.3–15.9)
RBC.: 2.15 MIL/uL — ABNORMAL LOW (ref 3.87–5.11)
Retic Count, Absolute: 88.6 K/uL (ref 19.0–186.0)
Retic Ct Pct: 4.1 % — ABNORMAL HIGH (ref 0.4–3.1)

## 2024-04-17 LAB — FOLATE: Folate: >20 ng/mL (ref >5.9)

## 2024-04-17 LAB — HEPATIC FUNCTION PANEL
ALT: 28 U/L (ref 0–44)
AST: 27 U/L (ref 15–41)
Albumin: 2.8 g/dL — ABNORMAL LOW (ref 3.5–5.0)
Alkaline Phosphatase: 93 U/L (ref 38–126)
Bilirubin, Direct: <0.1 mg/dL (ref 0.0–0.2)
Total Bilirubin: 0.4 mg/dL (ref 0.0–1.2)
Total Protein: 5.3 g/dL — ABNORMAL LOW (ref 6.5–8.1)

## 2024-04-17 LAB — PREPARE RBC (CROSSMATCH)

## 2024-04-17 LAB — LACTATE DEHYDROGENASE: LDH: 228 U/L — ABNORMAL HIGH (ref 98–192)

## 2024-04-17 MED ORDER — ACETAMINOPHEN 325 MG PO TABS
650.0000 mg | ORAL_TABLET | Freq: Once | ORAL | Status: AC
  Administered 2024-04-17: 650 mg via ORAL
  Filled 2024-04-17: qty 2

## 2024-04-17 MED ORDER — SODIUM CHLORIDE 0.9% IV SOLUTION: Freq: Once | INTRAVENOUS | Status: AC

## 2024-04-17 MED ORDER — SODIUM CHLORIDE 0.9 % IV SOLN
500.0000 mg | Freq: Once | INTRAVENOUS | Status: AC
  Administered 2024-04-17: 500 mg via INTRAVENOUS
  Filled 2024-04-17: qty 25

## 2024-04-17 MED ORDER — DARBEPOETIN ALFA 60 MCG/0.3ML IJ SOSY
60.0000 ug | PREFILLED_SYRINGE | INTRAMUSCULAR | Status: DC
  Administered 2024-04-17: 60 ug via SUBCUTANEOUS
  Filled 2024-04-17: qty 0.3

## 2024-04-17 MED ORDER — DIPHENHYDRAMINE HCL 25 MG PO CAPS
25.0000 mg | ORAL_CAPSULE | Freq: Once | ORAL | Status: AC
  Administered 2024-04-17: 25 mg via ORAL
  Filled 2024-04-17: qty 1

## 2024-04-17 MED ORDER — IRON SUCROSE 500 MG IVPB - SIMPLE MED
500.0000 mg | Freq: Once | INTRAVENOUS | Status: DC
  Filled 2024-04-17: qty 275

## 2024-04-17 MED ORDER — DANTROLENE SODIUM 25 MG PO CAPS
50.0000 mg | ORAL_CAPSULE | ORAL | Status: DC
  Administered 2024-04-17 – 2024-04-18 (×2): 50 mg via ORAL
  Filled 2024-04-17 (×3): qty 2

## 2024-04-17 NOTE — Progress Notes (Signed)
 Pre medications given before transfusion. Patient and family education of s&s of transfusion reaction.  Transfusion started slowly per doctor orders at . No adverse reaction after 15 mins. Rate increased to 144mls/hr.  Husband at bedside.

## 2024-04-17 NOTE — Plan of Care (Signed)

## 2024-04-17 NOTE — Progress Notes (Signed)
 Transfusion ended. No reaction reported

## 2024-04-17 NOTE — Progress Notes (Signed)
 PROGRESS NOTE        PATIENT DETAILS Name: Vanessa Short Age: 58 y.o. Sex: female Date of Birth: 1966/03/21 Admit Date: 04/15/2024 Admitting Physician Marsha Ada, MD ERE:Xjeojw, Josette ORN., [name]  Brief Summary: Patient is a 58 y.o.  female with multiple sclerosis-presented with altered mental status.  Significant events: 9/2>> admit to [facility]  Significant studies: 9/2>> CXR: Patchy bibasilar atelectasis. 9/2>> CT head: No acute intracranial abnormality.  Significant microbiology data: 9/2>> blood culture: No growth 9/2>> urine culture: No growth  Procedures: None  Consults: None  Subjective: Completely awake and alert-no further hallucinations.  Had some shortness of breath when blood was transfused earlier this morning.  Currently not any shortness of breath.  Objective: Vitals: Blood pressure (!) 100/53, pulse 74, temperature 97.6 F (36.4 C), temperature source Oral, resp. rate 16, height 5' 2 (1.575 m), weight 81.6 kg, SpO2 95%.   Exam: Gen Exam:Alert awake-not in any distress HEENT:atraumatic, normocephalic Chest: B/L clear to auscultation anteriorly CVS:S1S2 regular Abdomen:soft non tender, non distended Extremities:no edema Skin: no rash  Pertinent Labs/Radiology:    Latest Ref Rng & Units 04/17/2024    7:57 AM 04/17/2024   12:28 AM 04/16/2024    6:56 AM  CBC  WBC 4.0 - 10.5 K/uL 8.1  7.4  7.7   Hemoglobin 12.0 - 15.0 g/dL 7.1  6.2  7.1   Hematocrit 36.0 - 46.0 % 23.7  21.0  23.3   Platelets 150 - 400 K/uL 76  67  85     Lab Results  Component Value Date   NA 144 04/17/2024   K 3.7 04/17/2024   CL 117 (H) 04/17/2024   CO2 18 (L) 04/17/2024     Assessment/Plan: Acute toxic metabolic encephalopathy Suspect combination of dehydration/polypharmacy (in the setting of CKD) Clinically improved-after holding baclofen /dantrolene  on admission.   Stable overnight-continue current dose of baclofen -increase the dantrolene  back to  her home dose.  CKD stage IIIb At baseline Note-AKI ruled out.  Normocytic anemia Secondary to CKD/chronic disease No blood loss-brown stools yesterday per spouse Hb down to 6.2 earlier this morning-given 1 unit of PRBC but developed shortness of breath and stopped-reviewed labs with the blood bank-no evidence of hemolysis, per blood bank, has been cleared to receive transfusion. Spoke with patient-then with spouse over the phone-Will give IV iron /Aranesp -but this will not raise her hemoglobin right away-both spouse/patient willing to undergo another unit of PRBC transfusion. I have asked pharmacy to help and assistance of dosing Aranesp /IV iron . Follow CBC.  Thrombocytopenia Seems to be a chronic issue Platelet count close to baseline Follow-up with outpatient MD.  History of multiple sclerosis-bed to wheelchair bound Continue teriflunomide  Initiate baclofen /dantrolene  at half of home dosage and see how she does  Ulcerative colitis Continue mesalamine   Hypothyroidism Synthroid   Mood disorder Continue Trileptal /Prozac /Seroquel -seems to be tolerating these meds well without any issues with encephalopathy.  Class 1 Obesity: Estimated body mass index is 32.9 kg/m as calculated from the following:   Height as of this encounter: 5' 2 (1.575 m).   Weight as of this encounter: 81.6 kg.   Code status:   Code Status: Full Code   DVT Prophylaxis: Place and maintain sequential compression device Start: 04/17/24 0149   Family Communication: None at bedside   Disposition Plan: Status is: Inpatient Remains inpatient appropriate because: Severity of illness   Planned  Discharge Destination:Home   Diet: Diet Order             Diet regular Room service appropriate? Yes; Fluid consistency: Thin  Diet effective now                     Antimicrobial agents: Anti-infectives (From admission, onward)    Start     Dose/Rate Route Frequency Ordered Stop   04/15/24 0600   ceFEPIme  (MAXIPIME ) 1 g in sodium chloride  0.9 % 100 mL IVPB  Status:  Discontinued        1 g 200 mL/hr over 30 Minutes Intravenous Every 12 hours 04/15/24 0539 04/16/24 1042   04/15/24 0430  cefTRIAXone  (ROCEPHIN ) 1 g in sodium chloride  0.9 % 100 mL IVPB  Status:  Discontinued        1 g 200 mL/hr over 30 Minutes Intravenous  Once 04/15/24 0426 04/15/24 0533        MEDICATIONS: Scheduled Meds:  sodium chloride    Intravenous Once   acetaminophen   650 mg Oral Once   baclofen   5 mg Oral TID   Chlorhexidine  Gluconate Cloth  6 each Topical Daily   dantrolene   50 mg Oral 3 times per day   darbepoetin (ARANESP ) injection - NON-DIALYSIS  60 mcg Subcutaneous Q Thu-1800   diphenhydrAMINE   25 mg Oral Once   famotidine   40 mg Oral BID   FLUoxetine   40 mg Oral Daily   labetalol   50 mg Oral Daily   levothyroxine   75 mcg Oral QHS   mesalamine   0.375 g Oral QID   OXcarbazepine   150 mg Oral TID   QUEtiapine   25 mg Oral QHS   rosuvastatin   20 mg Oral QHS   Teriflunomide   1 tablet Oral Daily   Continuous Infusions:  iron  sucrose     PRN Meds:.mouth rinse   I have personally reviewed following labs and imaging studies  LABORATORY DATA: CBC: Recent Labs  Lab 04/13/24 0640 04/15/24 0400 04/16/24 0656 04/17/24 0028 04/17/24 0757  WBC 7.4 12.5* 7.7 7.4 8.1  NEUTROABS  --  9.7*  --   --   --   HGB 7.9* 8.8* 7.1* 6.2* 7.1*  HCT 27.5* 29.2* 23.3* 21.0* 23.7*  MCV 109.1* 107.4* 109.9* 111.1* 107.7*  PLT 96* 101* 85* 67* 76*    Basic Metabolic Panel: Recent Labs  Lab 04/13/24 0640 04/14/24 0625 04/15/24 0400 04/16/24 0656 04/17/24 0757  NA 147* 143 146* 145 144  K 4.6 4.6 4.3 4.1 3.7  CL 114* 112* 115* 116* 117*  CO2 20* 19* 19* 19* 18*  GLUCOSE 85 112* 120* 91 127*  BUN 37* 31* 31* 30* 27*  CREATININE 1.85* 1.60* 1.99* 1.98* 1.82*  CALCIUM  9.1 9.7 9.2 8.2* 8.3*    GFR: Estimated Creatinine Clearance: 32.9 mL/min (A) (by C-G formula based on SCr of 1.82 mg/dL  (H)).  Liver Function Tests: Recent Labs  Lab 04/11/24 1615 04/15/24 0400 04/17/24 0757  AST 22 27 27   ALT 24 25 28   ALKPHOS 162* 113 93  BILITOT 0.3 0.7 0.4  PROT 6.1* 6.0* 5.3*  ALBUMIN 3.8 3.3* 2.8*   No results for input(s): LIPASE, AMYLASE in the last 168 hours. No results for input(s): AMMONIA in the last 168 hours.  Coagulation Profile: Recent Labs  Lab 04/15/24 0400  INR 1.1    Cardiac Enzymes: No results for input(s): CKTOTAL, CKMB, CKMBINDEX, TROPONINI in the last 168 hours.  BNP (last 3 results) No results for input(s):  PROBNP in the last 8760 hours.  Lipid Profile: No results for input(s): CHOL, HDL, LDLCALC, TRIG, CHOLHDL, LDLDIRECT in the last 72 hours.  Thyroid  Function Tests: No results for input(s): TSH, T4TOTAL, FREET4, T3FREE, THYROIDAB in the last 72 hours.  Anemia Panel: Recent Labs    04/17/24 0757 04/17/24 0900 04/17/24 0909  VITAMINB12  --  793  --   FOLATE  --   --  >20.0  FERRITIN 182  --   --   TIBC 224*  --   --   IRON  41  --   --   RETICCTPCT  --   --  4.1*    Urine analysis:    Component Value Date/Time   COLORURINE YELLOW 04/15/2024 0317   APPEARANCEUR CLOUDY (A) 04/15/2024 0317   LABSPEC 1.014 04/15/2024 0317   PHURINE 6.0 04/15/2024 0317   GLUCOSEU 50 (A) 04/15/2024 0317   HGBUR LARGE (A) 04/15/2024 0317   BILIRUBINUR NEGATIVE 04/15/2024 0317   KETONESUR 5 (A) 04/15/2024 0317   PROTEINUR >=300 (A) 04/15/2024 0317   UROBILINOGEN 0.2 05/31/2013 0237   NITRITE NEGATIVE 04/15/2024 0317   LEUKOCYTESUR MODERATE (A) 04/15/2024 0317    Sepsis Labs: Lactic Acid, Venous    Component Value Date/Time   LATICACIDVEN 0.7 04/15/2024 0421    MICROBIOLOGY: Recent Results (from the past 240 hours)  Urine Culture (for pregnant, neutropenic or urologic patients or patients with an indwelling urinary catheter)     Status: None   Collection Time: 04/11/24 10:05 PM   Specimen: Urine, Clean  Catch  Result Value Ref Range Status   Specimen Description   Final    URINE, CLEAN CATCH Performed at [facility], [address] [place], [place] [address]    Special Requests   Final    Immunocompromised Performed at [facility], [address] [place], [place] [address]    Culture   Final    NO GROWTH Performed at [facility], [address], [place], [place] [address]    Report Status 04/13/2024 FINAL  Final  MRSA Next Gen by PCR, Nasal     Status: None   Collection Time: 04/12/24  2:15 PM   Specimen: Nasal Mucosa; Nasal Swab  Result Value Ref Range Status   MRSA by PCR Next Gen NOT DETECTED NOT DETECTED Final    Comment: (NOTE) The GeneXpert MRSA Assay (FDA approved for NASAL specimens only), is one component of a comprehensive MRSA colonization surveillance program. It is not intended to diagnose MRSA infection nor to guide or monitor treatment for MRSA infections. Test performance is not FDA approved in patients less than 67 years old. Performed at [facility], [address], [place], [place] [address]   Blood Culture (routine x 2)     Status: None (Preliminary result)   Collection Time: 04/15/24  2:23 AM   Specimen: BLOOD  Result Value Ref Range Status   Specimen Description BLOOD SITE NOT SPECIFIED  Final   Special Requests   Final    BOTTLES DRAWN AEROBIC AND ANAEROBIC Blood Culture adequate volume   Culture   Final    NO GROWTH 2 DAYS Performed at [facility], [address] [place], [place] [address]    Report Status PENDING  Incomplete  Urine Culture     Status: None   Collection Time: 04/15/24  3:17 AM   Specimen: Urine, Random  Result Value Ref Range Status   Specimen Description URINE,  RANDOM  Final   Special Requests NONE Reflexed from [id number]  Final   Culture   Final    NO GROWTH Performed at [facility], [address], [place], [place] [address]    Report Status  04/16/2024 FINAL  Final  Blood Culture (routine x 2)     Status: None (Preliminary result)   Collection Time: 04/15/24  2:48 PM   Specimen: BLOOD RIGHT HAND  Result Value Ref Range Status   Specimen Description BLOOD RIGHT HAND  Final   Special Requests   Final    BOTTLES DRAWN AEROBIC AND ANAEROBIC Blood Culture adequate volume   Culture   Final    NO GROWTH 2 DAYS Performed at [facility], [address] [place], [place] [address]    Report Status PENDING  Incomplete    RADIOLOGY STUDIES/RESULTS: DG CHEST PORT 1 VIEW Result Date: 04/17/2024 CLINICAL DATA:  [id number].  Shortness of breath. EXAM: PORTABLE CHEST 1 VIEW COMPARISON:  Portable chest 04/15/2024. FINDINGS: 6:05 a.m. Stable cardiomegaly and perihilar vascular congestion. No overt edema findings. Stable mediastinum with superior widening, aortic atherosclerosis. Right IJ port catheter terminates at the superior cavoatrial junction. Airspace disease asymmetrically left base appears slightly improved today. The lungs are otherwise clear. There is a small left pleural effusion. No measurable right pleural fluid. No new osseous abnormality. IMPRESSION: 1. Slight improvement in left basilar airspace disease. 2. Stable cardiomegaly and perihilar vascular congestion. 3. Small left pleural effusion. 4. Aortic atherosclerosis. Electronically Signed   By: Francis Quam M.D.   On: 04/17/2024 06:21     LOS: 2 days   Donalda Applebaum, MD  [facility]    To contact the attending provider between 7A-7P or the covering provider during after hours 7P-7A, please log into the web site [email or web address] and access using universal [place] password for that web site. If you do not have the password, please call the hospital operator.  04/17/2024, 12:55 PM

## 2024-04-17 NOTE — Progress Notes (Signed)
 Overnight floor coverage  Informed by RN that 30 minutes after blood transfusion was started patient complained of shortness of breath so transfusion was stopped.  Vital signs: Temperature 97.9 F, pulse 71, respiratory rate 17, blood pressure 127/61, and SpO2 94% on room air.  Patient is no longer complaining of shortness of breath now.    Advised RN to continue holding blood transfusion for now and call blood bank to do laboratory evaluation for transfusion reaction.  Stat chest x-ray ordered.

## 2024-04-17 NOTE — Progress Notes (Addendum)
 0200: new order for SCD. Currently the unit does not have any. RN spoke with charge RN. SCD machine was ordered. RN will follow up   0230: critical Hgb 6.2, RN paged Dr. Alfornia. New orders for type and screen and one unit of RBC ordered. RN will follow up   0330: RN started blood.   0345: 15 mins check, pt denies any SOB, chills or discomfort at this time, VS checked. RN will follow up   0400: RN went to check on the pt, pt complaining of SOB while on the blood. RN stopped blood and flushed port. RN called blood bank and paged Dr. Alfornia. RN pending orders. RN will follow up   0430: RN received call from Dr. Alfornia, transfusion reaction protocol was ordered. STAT CXR was ordered as well. RN will follow up   0600: RN went to round on the pt. At this time the pt denies any SOB or discomfort. RN will follow up

## 2024-04-17 NOTE — Progress Notes (Signed)
 Overnight floor coverage  Notified by RN regarding critical hemoglobin.  Hemoglobin currently 6.2 and was 7.1 on labs yesterday morning.  No obvious bleeding reported.  Hemodynamically stable.  Type and screen, 1 unit PRBCs ordered after obtaining consent from the patient.  Follow-up posttransfusion H&H and continue to transfuse if hemoglobin is less than 7.  Also given worsening thrombocytopenia on labs, will discontinue aspirin  and Lovenox .  SCDs ordered for DVT prophylaxis.

## 2024-04-18 DIAGNOSIS — N3000 Acute cystitis without hematuria: Secondary | ICD-10-CM | POA: Diagnosis not present

## 2024-04-18 DIAGNOSIS — G9340 Encephalopathy, unspecified: Secondary | ICD-10-CM | POA: Diagnosis not present

## 2024-04-18 DIAGNOSIS — R443 Hallucinations, unspecified: Secondary | ICD-10-CM | POA: Diagnosis not present

## 2024-04-18 LAB — CBC
HCT: 27.8 % — ABNORMAL LOW (ref 36.0–46.0)
Hemoglobin: 8.7 g/dL — ABNORMAL LOW (ref 12.0–15.0)
MCH: 33 pg (ref 26.0–34.0)
MCHC: 31.3 g/dL (ref 30.0–36.0)
MCV: 105.3 fL — ABNORMAL HIGH (ref 80.0–100.0)
Platelets: 70 K/uL — ABNORMAL LOW (ref 150–400)
RBC: 2.64 MIL/uL — ABNORMAL LOW (ref 3.87–5.11)
RDW: 17.4 % — ABNORMAL HIGH (ref 11.5–15.5)
WBC: 8 K/uL (ref 4.0–10.5)
nRBC: 0 % (ref 0.0–0.2)

## 2024-04-18 LAB — BPAM RBC
Blood Product Expiration Date: 202509102359
Blood Product Expiration Date: 202509252359
ISSUE DATE / TIME: 202509040327
ISSUE DATE / TIME: 202509041422
Unit Type and Rh: 6200
Unit Type and Rh: 9500

## 2024-04-18 LAB — TYPE AND SCREEN
ABO/RH(D): A POS
Antibody Screen: NEGATIVE
Unit division: 0
Unit division: 0

## 2024-04-18 LAB — HAPTOGLOBIN: Haptoglobin: 122 mg/dL (ref 33–346)

## 2024-04-18 MED ORDER — HEPARIN SOD (PORK) LOCK FLUSH 100 UNIT/ML IV SOLN
500.0000 [IU] | INTRAVENOUS | Status: AC | PRN
  Administered 2024-04-18: 500 [IU]

## 2024-04-18 MED ORDER — DIPHENHYDRAMINE HCL 25 MG PO CAPS
25.0000 mg | ORAL_CAPSULE | Freq: Once | ORAL | Status: AC | PRN
  Administered 2024-04-18: 25 mg via ORAL
  Filled 2024-04-18: qty 1

## 2024-04-18 MED ORDER — BACLOFEN 10 MG PO TABS: 5.0000 mg | ORAL_TABLET | Freq: Three times a day (TID) | ORAL | Status: DC

## 2024-04-18 MED ORDER — FERROUS SULFATE 325 (65 FE) MG PO TABS: 325.0000 mg | ORAL_TABLET | Freq: Two times a day (BID) | ORAL | Status: AC

## 2024-04-18 NOTE — TOC Transition Note (Signed)
 Transition of Care [facility]) - Discharge Note   Patient Details  Name: Vanessa Short MRN: [id number] Date of Birth: 16-Nov-1965  [facility] CM/SW Contact:  Roxie KANDICE Stain, RN Phone Number: 04/18/2024, 2:00 PM   Clinical Narrative:    [name] is stable to discharge home. Follow up apt on [address].Spouse will transport home. No ICM (Inpatient Care Management) needs at this time.    Final next level of care: Home/Self Care Barriers to Discharge: Barriers Resolved   Patient Goals and CMS Choice Patient states their goals for this hospitalization and ongoing recovery are:: return home          Discharge Placement                       Discharge Plan and Services Additional resources added to the After Visit Summary for     Discharge Planning Services: CM Consult                                 Social Drivers of Health (SDOH) Interventions SDOH Screenings   Food Insecurity: No Food Insecurity (04/15/2024)  Housing: Low Risk  (04/15/2024)  Transportation Needs: No Transportation Needs (04/15/2024)  Utilities: Not At Risk (04/15/2024)  Social Connections: Moderately Integrated (08/22/2023)  Stress: No Stress Concern Present (09/24/2020)   Received from [facility]  Tobacco Use: Low Risk  (04/15/2024)     Readmission Risk Interventions    08/24/2023    9:42 AM  Readmission Risk Prevention Plan  Transportation Screening Complete  PCP or Specialist Appt within 3-5 Days Complete  HRI or Home Care Consult Complete  Social Work Consult for Recovery Care Planning/Counseling Complete  Palliative Care Screening Not Applicable  Medication Review Oceanographer) Complete

## 2024-04-18 NOTE — Plan of Care (Signed)

## 2024-04-18 NOTE — Discharge Summary (Addendum)
 PATIENT DETAILS Name: Vanessa Short Age: 58 y.o. Sex: female Date of Birth: [birth year] MRN: [id number]. Admitting Physician: Marsha Ada, MD ERE:Xjeojw, Vanessa ORN., [name]  Admit Date: 04/15/2024 Discharge date: 04/18/2024  Recommendations for Outpatient Follow-up:  Follow up with PCP in 1-2 weeks Please obtain CMP/CBC in one week  Admitted From:  Home  Disposition: Home   Discharge Condition: good  CODE STATUS:   Code Status: Full Code   Diet recommendation:  Diet Order             Diet - low sodium heart healthy           Diet regular Room service appropriate? Yes; Fluid consistency: Thin  Diet effective now                    Brief Summary: Patient is a 58 y.o.  female with multiple sclerosis-presented with altered mental status.   Significant events: 9/2>> admit to [facility]   Significant studies: 9/2>> CXR: Patchy bibasilar atelectasis. 9/2>> CT head: No acute intracranial abnormality.   Significant microbiology data: 9/2>> blood culture: No growth 9/2>> urine culture: No growth   Procedures: None   Consults: None  Brief Hospital Course: Acute toxic metabolic encephalopathy Suspect combination of dehydration/polypharmacy (in the setting of CKD) Clinically improved-after holding baclofen /dantrolene  on admission.   Baclofen  resumed the next day-at half of home dose after discussion with pharmacy given CKD Back on usual dosing of dantrolene  Continues to do well without any major issues with her mentation-back to baseline.  CKD stage IIIb At baseline Note-AKI ruled out.   Normocytic anemia Secondary to CKD/chronic disease-drop in hemoglobin this admission is likely secondary to acute illness/IV fluid dilution. No blood loss-brown stools yesterday per spouse Given 1 unit of PRBC-some question whether patient had a transfusion reaction when she first had a blood transfusion-transfusion was held-hemolytic labs were negative-in retrospect-this may  have been an episode of anxiety (transfusion reaction ruled out).  After discussion with the family-patient was given another unit of PRBC which she tolerated well.  After discussion with nephrology/pharmacy-she was also given a dose of Aranesp /IV iron .  She will continue with oral iron  supplementation-discussed with patient/spouse at bedside-they will discuss with nephrology to see if she can continue to get Aranesp  injections in the outpatient setting.     Thrombocytopenia Seems to be a chronic issue Platelet count close to baseline Follow-up with outpatient MD.   History of multiple sclerosis-bed to wheelchair bound Continue teriflunomide  Back on usual dosing of dantrolene  without any worsening mental status Tolerating half of usual dosing of baclofen .   Ulcerative colitis Continue mesalamine    Hypothyroidism Synthroid    Mood disorder Continue Trileptal /Prozac /Seroquel -seems to be tolerating these meds well without any issues with encephalopathy.   Class 1 Obesity: Estimated body mass index is 32.9 kg/m as calculated from the following:   Height as of this encounter: 5' 2 (1.575 m).   Weight as of this encounter: 81.6 kg.  Discharge Diagnoses:  Active Problems:   Acute encephalopathy   Discharge Instructions:  Activity:  As tolerated    Discharge Instructions     Diet - low sodium heart healthy   Complete by: As directed    Discharge instructions   Complete by: As directed    Follow with Primary MD  Debrah Vanessa ORN., [name] in 1-2 weeks  Please get a complete blood count and chemistry panel checked by your Primary MD at your next visit, and again as instructed by your  Primary MD.  Get Medicines reviewed and adjusted: Please take all your medications with you for your next visit with your Primary MD  Laboratory/radiological data: Please request your Primary MD to go over all hospital tests and procedure/radiological results at the follow up, please ask your  Primary MD to get all Hospital records sent to his/her office.  In some cases, they will be blood work, cultures and biopsy results pending at the time of your discharge. Please request that your primary care M.D. follows up on these results.  Also Note the following: If you experience worsening of your admission symptoms, develop shortness of breath, life threatening emergency, suicidal or homicidal thoughts you must seek medical attention immediately by calling 911 or calling your MD immediately  if symptoms less severe.  You must read complete instructions/literature along with all the possible adverse reactions/side effects for all the Medicines you take and that have been prescribed to you. Take any new Medicines after you have completely understood and accpet all the possible adverse reactions/side effects.   Do not drive when taking Pain medications or sleeping medications (Benzodaizepines)  Do not take more than prescribed Pain, Sleep and Anxiety Medications. It is not advisable to combine anxiety,sleep and pain medications without talking with your primary care practitioner  Special Instructions: If you have smoked or chewed Tobacco  in the last 2 yrs please stop smoking, stop any regular Alcohol  and or any Recreational drug use.  Wear Seat belts while driving.  Please note: You were cared for by a hospitalist during your hospital stay. Once you are discharged, your primary care physician will handle any further medical issues. Please note that NO REFILLS for any discharge medications will be authorized once you are discharged, as it is imperative that you return to your primary care physician (or establish a relationship with a primary care physician if you do not have one) for your post hospital discharge needs so that they can reassess your need for medications and monitor your lab values.   Increase activity slowly   Complete by: As directed    No wound care   Complete by: As  directed       Allergies as of 04/18/2024       Reactions   Penicillins Dermatitis, Rash, Other (See Comments)   Cannot take pure penicillin   Nitrofurantoin Other (See Comments)   Syncope   Oxycodone -acetaminophen  Other (See Comments)   Hallucinations   Risperidone And Paliperidone Other (See Comments)   Became very lethargic        Medication List     STOP taking these medications    cefadroxil  500 MG capsule Commonly known as: DURICEF       TAKE these medications    acyclovir  400 MG tablet Commonly known as: ZOVIRAX  Take 400 mg by mouth in the morning.   aspirin  EC 81 MG tablet Take 81 mg by mouth in the morning. Swallow whole.   b complex vitamins tablet Take 1 tablet by mouth daily.   baclofen  10 MG tablet Commonly known as: LIORESAL  Take 0.5 tablets (5 mg total) by mouth 3 (three) times daily. What changed:  how much to take when to take this   calcitRIOL  0.25 MCG capsule Commonly known as: ROCALTROL  Take 0.25 mcg by mouth in the morning.   dantrolene  50 MG capsule Commonly known as: DANTRIUM  Take 1 capsule (50 mg total) by mouth 3 (three) times daily as needed. What changed:  when to take this  additional instructions   famotidine  40 MG tablet Commonly known as: PEPCID  Take 40 mg by mouth See admin instructions. Take 40 mg by mouth in the morning and at 4 PM   ferrous sulfate  325 (65 FE) MG tablet Take 1 tablet (325 mg total) by mouth 2 (two) times daily with a meal. What changed: when to take this   FLUoxetine  40 MG capsule Commonly known as: PROZAC  Take 40 mg by mouth in the morning and at bedtime.   folic acid  400 MCG tablet Commonly known as: FOLVITE  Take 400 mcg by mouth in the morning.   furosemide  40 MG tablet Commonly known as: LASIX  Take 0.5 tablets (20 mg total) by mouth 2 (two) times daily. What changed:  when to take this additional instructions   labetalol  100 MG tablet Commonly known as: NORMODYNE  TAKE 1/2  TABLET BY MOUTH DAILY What changed: when to take this   levothyroxine  75 MCG tablet Commonly known as: SYNTHROID  Take 75 mcg by mouth at bedtime.   lidocaine -prilocaine  cream Commonly known as: EMLA  Apply one inch before port-a cath access prn What changed:  how much to take how to take this when to take this reasons to take this additional instructions   mesalamine  0.375 g 24 hr capsule Commonly known as: APRISO  Take 375 mg by mouth 4 (four) times daily.   multivitamin tablet Take 1 tablet by mouth daily with breakfast.   OXcarbazepine  150 MG tablet Commonly known as: TRILEPTAL  Take 1 tablet (150 mg total) by mouth 3 (three) times daily. What changed:  when to take this additional instructions   potassium chloride  10 MEQ tablet Commonly known as: KLOR-CON  Take 2 tablets (20 mEq total) by mouth daily. Take 4 pills daily for 3 days then continue taking as before What changed:  how much to take when to take this additional instructions   QUEtiapine  25 MG tablet Commonly known as: SEROQUEL  One po qHS and one po every day prn What changed:  how much to take how to take this when to take this additional instructions   rosuvastatin  20 MG tablet Commonly known as: CRESTOR  Take 20 mg by mouth at bedtime.   sodium bicarbonate 650 MG tablet Take 650-1,300 mg by mouth See admin instructions. Take 1,300 mg by mouth with breakfast and 650 mg at 4 PM   sodium chloride  0.65 % Soln nasal spray Commonly known as: OCEAN Place 1 spray into both nostrils as needed for congestion.   Teriflunomide  14 MG Tabs Take 1 tablet (14 mg total) by mouth daily. What changed: when to take this   tolterodine  2 MG tablet Commonly known as: DETROL  Take 2 mg by mouth See admin instructions. Take 2 mg by mouth in the morning and at 4 PM   Vitamin D  50 MCG (2000 UT) Caps Take 2,000 Units by mouth in the morning.        Follow-up Information     Debrah Vanessa ORN., [name]. Schedule  an appointment as soon as possible for a visit in 1 week(s).   Specialty: Family Medicine Contact information: [address] [place] [address] [phone]                Allergies  Allergen Reactions   Penicillins Dermatitis, Rash and Other (See Comments)    Cannot take pure penicillin   Nitrofurantoin Other (See Comments)    Syncope    Oxycodone -Acetaminophen  Other (See Comments)    Hallucinations   Risperidone And Paliperidone Other (See  Comments)    Became very lethargic     Other Procedures/Studies: DG CHEST PORT 1 VIEW Result Date: 04/17/2024 CLINICAL DATA:  [id number].  Shortness of breath. EXAM: PORTABLE CHEST 1 VIEW COMPARISON:  Portable chest 04/15/2024. FINDINGS: 6:05 a.m. Stable cardiomegaly and perihilar vascular congestion. No overt edema findings. Stable mediastinum with superior widening, aortic atherosclerosis. Right IJ port catheter terminates at the superior cavoatrial junction. Airspace disease asymmetrically left base appears slightly improved today. The lungs are otherwise clear. There is a small left pleural effusion. No measurable right pleural fluid. No new osseous abnormality. IMPRESSION: 1. Slight improvement in left basilar airspace disease. 2. Stable cardiomegaly and perihilar vascular congestion. 3. Small left pleural effusion. 4. Aortic atherosclerosis. Electronically Signed   By: Francis Quam M.D.   On: 04/17/2024 06:21   CT HEAD WO CONTRAST ( ) Result Date: 04/15/2024 CLINICAL DATA:  Mental status changes. EXAM: CT HEAD WITHOUT CONTRAST TECHNIQUE: Contiguous axial images were obtained from the base of the skull through the vertex without intravenous contrast. RADIATION DOSE REDUCTION: This exam was performed according to the departmental dose-optimization program which includes automated exposure control, adjustment of the mA and/or kV according to patient size and/or use of iterative reconstruction technique. COMPARISON:  04/24/2023  FINDINGS: Brain: There is no evidence for acute hemorrhage, hydrocephalus, mass lesion, or abnormal extra-axial fluid collection. No definite CT evidence for acute infarction. Diffuse loss of parenchymal volume is consistent with atrophy. Patchy low attenuation in the deep hemispheric and periventricular white matter is nonspecific, but likely reflects chronic microvascular ischemic demyelination. Vascular: No hyperdense vessel or unexpected calcification. Skull: No evidence for fracture. No worrisome lytic or sclerotic lesion. Sinuses/Orbits: The visualized paranasal sinuses and mastoid air cells are clear. Visualized portions of the globes and intraorbital fat are unremarkable. Other: None. IMPRESSION: 1. No acute intracranial abnormality. 2. Atrophy with chronic small vessel ischemic disease. Electronically Signed   By: Camellia Candle M.D.   On: 04/15/2024 05:58   DG Chest Port 1 View Result Date: 04/15/2024 CLINICAL DATA:  Possible sepsis EXAM: PORTABLE CHEST 1 VIEW COMPARISON:  04/11/2024 FINDINGS: Cardiac shadow is enlarged but stable. Aortic calcifications are noted. Right-sided chest wall port is again seen. Patchy atelectatic changes are noted in the bases left greater than right. No bony abnormality is noted. IMPRESSION: Patchy bibasilar atelectatic changes. Electronically Signed   By: Oneil Devonshire M.D.   On: 04/15/2024 02:37   DG Chest Port 1 View Result Date: 04/11/2024 CLINICAL DATA:  Shortness of breath EXAM: PORTABLE CHEST 1 VIEW COMPARISON:  Chest x-ray 08/21/2023 FINDINGS: Right chest port catheter tip ends in the SVC. Cardiomediastinal silhouette is stable, the heart is mildly enlarged. There is no focal lung infiltrate, pleural effusion or pneumothorax. No acute fractures are seen. IMPRESSION: No active disease. Mild cardiomegaly. Electronically Signed   By: Greig Pique M.D.   On: 04/11/2024 17:59     TODAY-DAY OF DISCHARGE:  Subjective:   Vance Gainor today has no headache,no  chest abdominal pain,no new weakness tingling or numbness, feels much better wants to go home today.  Objective:   Blood pressure 130/64, pulse 68, temperature 98.2 F (36.8 C), temperature source Oral, resp. rate 16, height 5' 2 (1.575 m), weight 81.6 kg, SpO2 98%.  Intake/Output Summary (Last 24 hours) at 04/18/2024 0909 Last data filed at 04/18/2024 0400 Gross per 24 hour  Intake 875.83 ml  Output 350 ml  Net 525.83 ml   Filed Weights   04/15/24 0126  Weight: 81.6 kg  Exam: Awake Alert, Oriented *3, No new F.N deficits, Normal affect [place].AT,PERRAL Supple Neck,No JVD, No cervical lymphadenopathy appriciated.  Symmetrical Chest wall movement, Good air movement bilaterally, CTAB RRR,No Gallops,Rubs or new Murmurs, No Parasternal Heave +ve B.Sounds, Abd Soft, Non tender, No organomegaly appriciated, No rebound -guarding or rigidity. No Cyanosis, Clubbing or edema, No new Rash or bruise   PERTINENT RADIOLOGIC STUDIES: DG CHEST PORT 1 VIEW Result Date: 04/17/2024 CLINICAL DATA:  [id number].  Shortness of breath. EXAM: PORTABLE CHEST 1 VIEW COMPARISON:  Portable chest 04/15/2024. FINDINGS: 6:05 a.m. Stable cardiomegaly and perihilar vascular congestion. No overt edema findings. Stable mediastinum with superior widening, aortic atherosclerosis. Right IJ port catheter terminates at the superior cavoatrial junction. Airspace disease asymmetrically left base appears slightly improved today. The lungs are otherwise clear. There is a small left pleural effusion. No measurable right pleural fluid. No new osseous abnormality. IMPRESSION: 1. Slight improvement in left basilar airspace disease. 2. Stable cardiomegaly and perihilar vascular congestion. 3. Small left pleural effusion. 4. Aortic atherosclerosis. Electronically Signed   By: Francis Quam M.D.   On: 04/17/2024 06:21     PERTINENT LAB RESULTS: CBC: Recent Labs    04/17/24 0757 04/18/24 0246  WBC 8.1 8.0  HGB 7.1* 8.7*  HCT 23.7*  27.8*  PLT 76* 70*   CMET CMP     Component Value Date/Time   NA 144 04/17/2024 0757   NA 147 (H) 01/10/2024 1136   K 3.7 04/17/2024 0757   CL 117 (H) 04/17/2024 0757   CO2 18 (L) 04/17/2024 0757   GLUCOSE 127 (H) 04/17/2024 0757   BUN 27 (H) 04/17/2024 0757   BUN 51 (H) 01/10/2024 1136   CREATININE 1.82 (H) 04/17/2024 0757   CALCIUM  8.3 (L) 04/17/2024 0757   PROT 5.3 (L) 04/17/2024 0757   PROT 6.2 10/15/2023 1315   ALBUMIN 2.8 (L) 04/17/2024 0757   ALBUMIN 4.3 10/15/2023 1315   AST 27 04/17/2024 0757   ALT 28 04/17/2024 0757   ALKPHOS 93 04/17/2024 0757   BILITOT 0.4 04/17/2024 0757   BILITOT <0.2 10/15/2023 1315   EGFR 30 (L) 01/10/2024 1136   GFRNONAA 32 (L) 04/17/2024 0757    GFR Estimated Creatinine Clearance: 32.9 mL/min (A) (by C-G formula based on SCr of 1.82 mg/dL (H)). No results for input(s): LIPASE, AMYLASE in the last 72 hours. No results for input(s): CKTOTAL, CKMB, CKMBINDEX, TROPONINI in the last 72 hours. Invalid input(s): POCBNP No results for input(s): DDIMER in the last 72 hours. No results for input(s): HGBA1C in the last 72 hours. No results for input(s): CHOL, HDL, LDLCALC, TRIG, CHOLHDL, LDLDIRECT in the last 72 hours. No results for input(s): TSH, T4TOTAL, T3FREE, THYROIDAB in the last 72 hours.  Invalid input(s): FREET3 Recent Labs    04/17/24 0757 04/17/24 0900 04/17/24 0909  VITAMINB12  --  793  --   FOLATE  --   --  >20.0  FERRITIN 182  --   --   TIBC 224*  --   --   IRON  41  --   --   RETICCTPCT  --   --  4.1*   Coags: No results for input(s): INR in the last 72 hours.  Invalid input(s): PT Microbiology: Recent Results (from the past 240 hours)  Urine Culture (for pregnant, neutropenic or urologic patients or patients with an indwelling urinary catheter)     Status: None   Collection Time: 04/11/24 10:05 PM   Specimen: Urine, Clean Catch  Result Value  Ref Range Status   Specimen  Description   Final    URINE, CLEAN CATCH Performed at [facility], [address], [place], [place] [address]    Special Requests   Final    Immunocompromised Performed at [facility], [address], [place], [place] [address]    Culture   Final    NO GROWTH Performed at [facility], [address] [place], [place] [address]    Report Status 04/13/2024 FINAL  Final  MRSA Next Gen by PCR, Nasal     Status: None   Collection Time: 04/12/24  2:15 PM   Specimen: Nasal Mucosa; Nasal Swab  Result Value Ref Range Status   MRSA by PCR Next Gen NOT DETECTED NOT DETECTED Final    Comment: (NOTE) The GeneXpert MRSA Assay (FDA approved for NASAL specimens only), is one component of a comprehensive MRSA colonization surveillance program. It is not intended to diagnose MRSA infection nor to guide or monitor treatment for MRSA infections. Test performance is not FDA approved in patients less than 92 years old. Performed at [facility], [address], [place], [place] [address]   Blood Culture (routine x 2)     Status: None (Preliminary result)   Collection Time: 04/15/24  2:23 AM   Specimen: BLOOD  Result Value Ref Range Status   Specimen Description BLOOD SITE NOT SPECIFIED  Final   Special Requests   Final    BOTTLES DRAWN AEROBIC AND ANAEROBIC Blood Culture adequate volume   Culture   Final    NO GROWTH 3 DAYS Performed at [facility], [address] [place], [place] [address]    Report Status PENDING  Incomplete  Urine Culture     Status: None   Collection Time: 04/15/24  3:17 AM   Specimen: Urine, Random  Result Value Ref Range Status   Specimen Description URINE, RANDOM  Final   Special Requests NONE Reflexed from [phone]  Final   Culture   Final    NO GROWTH Performed at [facility], [address] [place], [place] [address]    Report Status 04/16/2024 FINAL  Final  Blood Culture (routine x  2)     Status: None (Preliminary result)   Collection Time: 04/15/24  2:48 PM   Specimen: BLOOD RIGHT HAND  Result Value Ref Range Status   Specimen Description BLOOD RIGHT HAND  Final   Special Requests   Final    BOTTLES DRAWN AEROBIC AND ANAEROBIC Blood Culture adequate volume   Culture   Final    NO GROWTH 3 DAYS Performed at [facility], [address] [place], [place] [address]    Report Status PENDING  Incomplete    FURTHER DISCHARGE INSTRUCTIONS:  Get Medicines reviewed and adjusted: Please take all your medications with you for your next visit with your Primary MD  Laboratory/radiological data: Please request your Primary MD to go over all hospital tests and procedure/radiological results at the follow up, please ask your Primary MD to get all Hospital records sent to his/her office.  In some cases, they will be blood work, cultures and biopsy results pending at the time of your discharge. Please request that your primary care M.D. goes through all the records of your hospital data and follows up on these results.  Also Note the following: If you experience worsening of your admission symptoms, develop shortness of breath, life threatening emergency, suicidal  or homicidal thoughts you must seek medical attention immediately by calling 911 or calling your MD immediately  if symptoms less severe.  You must read complete instructions/literature along with all the possible adverse reactions/side effects for all the Medicines you take and that have been prescribed to you. Take any new Medicines after you have completely understood and accpet all the possible adverse reactions/side effects.   Do not drive when taking Pain medications or sleeping medications (Benzodaizepines)  Do not take more than prescribed Pain, Sleep and Anxiety Medications. It is not advisable to combine anxiety,sleep and pain medications without talking with your primary care practitioner  Special  Instructions: If you have smoked or chewed Tobacco  in the last 2 yrs please stop smoking, stop any regular Alcohol  and or any Recreational drug use.  Wear Seat belts while driving.  Please note: You were cared for by a hospitalist during your hospital stay. Once you are discharged, your primary care physician will handle any further medical issues. Please note that NO REFILLS for any discharge medications will be authorized once you are discharged, as it is imperative that you return to your primary care physician (or establish a relationship with a primary care physician if you do not have one) for your post hospital discharge needs so that they can reassess your need for medications and monitor your lab values.  Total Time spent coordinating discharge including counseling, education and face to face time equals greater than 30 minutes.  Signed: Signora Zucco 04/18/2024 9:09 AM

## 2024-04-18 NOTE — Care Management Important Message (Signed)
 Important Message  Patient Details  Name: Vanessa Short MRN: [id number] Date of Birth: 1966-07-31   Important Message Given:  Yes - Medicare IM   Patient left prior to IM delivery will mail a copy to the patient home address.   Lyndsee Casa 04/18/2024, 2:06 PM

## 2024-04-20 LAB — CULTURE, BLOOD (ROUTINE X 2)
Culture: NO GROWTH
Culture: NO GROWTH
Special Requests: ADEQUATE
Special Requests: ADEQUATE

## 2024-04-27 ENCOUNTER — Encounter: Payer: Self-pay | Admitting: Neurology

## 2024-04-28 ENCOUNTER — Telehealth: Payer: Self-pay

## 2024-04-28 ENCOUNTER — Other Ambulatory Visit: Payer: Self-pay

## 2024-04-28 ENCOUNTER — Other Ambulatory Visit (HOSPITAL_COMMUNITY): Payer: Self-pay

## 2024-04-28 DIAGNOSIS — G35 Multiple sclerosis: Secondary | ICD-10-CM

## 2024-04-28 MED ORDER — TERIFLUNOMIDE 14 MG PO TABS: 1.0000 | ORAL_TABLET | Freq: Every day | ORAL | 0 refills | Status: DC

## 2024-04-28 NOTE — Telephone Encounter (Signed)
 Pharmacy Patient Advocate Encounter   Received notification from Physician's Office that prior authorization for Teriflunomide  is required/requested.   Insurance verification completed.   The patient is insured through [facility] .   Per test claim: PA required; PA submitted to above mentioned insurance via Latent Key/confirmation #/EOC [facility] Status is pending

## 2024-04-28 NOTE — Telephone Encounter (Signed)
 Can someone please do a PA on pt's Teriflunomide  14 mg TABS.

## 2024-04-28 NOTE — Telephone Encounter (Signed)
 [employer] [place] 04/28/24-04/28/25. Will fax approval notification will be faxed to your office.

## 2024-04-29 NOTE — Telephone Encounter (Signed)
 Received fax below re PA approval

## 2024-06-12 ENCOUNTER — Encounter: Payer: Self-pay | Admitting: Neurology

## 2024-06-12 DIAGNOSIS — G35C1 Active secondary progressive multiple sclerosis: Secondary | ICD-10-CM | POA: Insufficient documentation

## 2024-06-16 ENCOUNTER — Telehealth: Payer: Self-pay | Admitting: *Deleted

## 2024-06-16 NOTE — Telephone Encounter (Addendum)
 Dr. Sater- I gave updated order below to [facility]. Your last OV note needs updated ICD 10 that you put: G35.C1

## 2024-07-09 ENCOUNTER — Ambulatory Visit
Admission: RE | Admit: 2024-07-09 | Discharge: 2024-07-09 | Disposition: A | Discharge status: Home / Self Care | Source: Ambulatory Visit | Attending: Family Medicine | Admitting: Family Medicine

## 2024-07-09 DIAGNOSIS — Z1231 Encounter for screening mammogram for malignant neoplasm of breast: Secondary | ICD-10-CM

## 2024-07-13 ENCOUNTER — Encounter: Payer: Self-pay | Admitting: Neurology

## 2024-07-13 DIAGNOSIS — G35D Multiple sclerosis, unspecified: Secondary | ICD-10-CM

## 2024-07-14 MED ORDER — TERIFLUNOMIDE 14 MG PO TABS: 1.0000 | ORAL_TABLET | Freq: Every day | ORAL | 1 refills | Status: AC

## 2024-07-27 ENCOUNTER — Encounter: Payer: Self-pay | Admitting: Neurology

## 2024-07-28 NOTE — Telephone Encounter (Signed)
 Dr Vear, can you review for change in dose? I see pt was hospitalized in September for acute cystitis. They decreased her Baclofen  to 5 mg instead of 10 mg. She states she is taking 5 mg QID. Would need Rx change to reflect directions.

## 2024-08-04 MED ORDER — BACLOFEN 10 MG PO TABS: 10.0000 mg | ORAL_TABLET | Freq: Four times a day (QID) | ORAL | 0 refills | Status: DC

## 2024-08-08 ENCOUNTER — Other Ambulatory Visit: Payer: Self-pay | Admitting: Neurology

## 2024-08-11 ENCOUNTER — Other Ambulatory Visit: Payer: Self-pay

## 2024-08-11 MED ORDER — BACLOFEN 10 MG PO TABS: 10.0000 mg | ORAL_TABLET | Freq: Four times a day (QID) | ORAL | 0 refills | Status: DC

## 2024-08-12 ENCOUNTER — Other Ambulatory Visit: Payer: Self-pay

## 2024-08-13 ENCOUNTER — Encounter: Payer: Self-pay | Admitting: Neurology

## 2024-08-13 MED ORDER — DANTROLENE SODIUM 50 MG PO CAPS: 50.0000 mg | ORAL_CAPSULE | Freq: Three times a day (TID) | ORAL | 3 refills | Status: AC | PRN

## 2024-08-18 ENCOUNTER — Other Ambulatory Visit: Payer: Self-pay | Admitting: Neurology

## 2024-08-18 MED ORDER — BACLOFEN 5 MG PO TABS: ORAL_TABLET | ORAL | 3 refills | Status: AC

## 2024-09-10 ENCOUNTER — Telehealth: Payer: Self-pay | Admitting: Neurology

## 2024-09-10 DIAGNOSIS — G35C1 Active secondary progressive multiple sclerosis: Secondary | ICD-10-CM

## 2024-09-10 DIAGNOSIS — G35D Multiple sclerosis, unspecified: Secondary | ICD-10-CM

## 2024-09-10 DIAGNOSIS — Z95828 Presence of other vascular implants and grafts: Secondary | ICD-10-CM

## 2024-09-10 DIAGNOSIS — Z79899 Other long term (current) drug therapy: Secondary | ICD-10-CM

## 2024-09-10 NOTE — Telephone Encounter (Signed)
 I called pt back.  She has a port , (thinks inserted about 4-5 yrs ago).  She is very hard stick.  [facility] has been flushing it every 6 wks.  (It flushes easily but has not been able to get blood return).  This has happened 3 times (over 18 wks time (every 6 wks).  What to do?  She does not recall who put it in.

## 2024-09-10 NOTE — Telephone Encounter (Signed)
 Pt called to request to speak to Nurse the patient stated she has went for infusion but due to her problem with port was not able to get infusion . Pt stated she is having problem with her blood follow and need to speak to someone soon.

## 2024-09-17 ENCOUNTER — Telehealth: Payer: Self-pay | Admitting: Neurology

## 2024-09-17 NOTE — Telephone Encounter (Signed)
 Placed order for general surgery for port access/evaluation.

## 2024-09-17 NOTE — Telephone Encounter (Signed)
 Referral for general surgery fax to [facility]. Phone: [phone], Fax: [phone]

## 2024-11-11 ENCOUNTER — Ambulatory Visit: Admitting: Neurology
